# Patient Record
Sex: Male | Born: 1935 | Race: White | Hispanic: No | Marital: Married | Smoking: Former smoker
Health system: Southern US, Community
[De-identification: ages and names within clinical notes are randomized; demographics above are authoritative.]

## PROBLEM LIST (undated history)

## (undated) DIAGNOSIS — R234 Changes in skin texture: Secondary | ICD-10-CM

## (undated) DIAGNOSIS — D696 Thrombocytopenia, unspecified: Secondary | ICD-10-CM

## (undated) DIAGNOSIS — E785 Hyperlipidemia, unspecified: Secondary | ICD-10-CM

## (undated) DIAGNOSIS — N400 Enlarged prostate without lower urinary tract symptoms: Secondary | ICD-10-CM

## (undated) DIAGNOSIS — Z860100 Personal history of colon polyps, unspecified: Secondary | ICD-10-CM

## (undated) DIAGNOSIS — M1990 Unspecified osteoarthritis, unspecified site: Secondary | ICD-10-CM

## (undated) DIAGNOSIS — I739 Peripheral vascular disease, unspecified: Secondary | ICD-10-CM

## (undated) DIAGNOSIS — D699 Hemorrhagic condition, unspecified: Secondary | ICD-10-CM

## (undated) DIAGNOSIS — M2550 Pain in unspecified joint: Secondary | ICD-10-CM

## (undated) DIAGNOSIS — K219 Gastro-esophageal reflux disease without esophagitis: Secondary | ICD-10-CM

## (undated) DIAGNOSIS — H9190 Unspecified hearing loss, unspecified ear: Secondary | ICD-10-CM

## (undated) DIAGNOSIS — I219 Acute myocardial infarction, unspecified: Secondary | ICD-10-CM

## (undated) DIAGNOSIS — M2540 Effusion, unspecified joint: Secondary | ICD-10-CM

## (undated) DIAGNOSIS — I639 Cerebral infarction, unspecified: Secondary | ICD-10-CM

## (undated) DIAGNOSIS — Z8719 Personal history of other diseases of the digestive system: Secondary | ICD-10-CM

## (undated) DIAGNOSIS — Z8619 Personal history of other infectious and parasitic diseases: Secondary | ICD-10-CM

## (undated) DIAGNOSIS — R351 Nocturia: Secondary | ICD-10-CM

## (undated) DIAGNOSIS — I2510 Atherosclerotic heart disease of native coronary artery without angina pectoris: Secondary | ICD-10-CM

## (undated) DIAGNOSIS — I10 Essential (primary) hypertension: Secondary | ICD-10-CM

## (undated) DIAGNOSIS — R350 Frequency of micturition: Secondary | ICD-10-CM

## (undated) DIAGNOSIS — I5042 Chronic combined systolic (congestive) and diastolic (congestive) heart failure: Secondary | ICD-10-CM

## (undated) HISTORY — DX: Cerebral infarction, unspecified: I63.9

## (undated) HISTORY — PX: COLONOSCOPY: SHX174

## (undated) HISTORY — PX: SHOULDER SURGERY: SHX246

## (undated) HISTORY — PX: CATARACT EXTRACTION W/ INTRAOCULAR LENS  IMPLANT, BILATERAL: SHX1307

## (undated) HISTORY — PX: CAROTID ENDARTERECTOMY: SUR193

---

## 2000-03-26 ENCOUNTER — Ambulatory Visit (HOSPITAL_COMMUNITY): Admission: RE | Admit: 2000-03-26 | Discharge: 2000-03-26 | Payer: Self-pay | Admitting: Internal Medicine

## 2001-05-30 ENCOUNTER — Encounter: Payer: Self-pay | Admitting: Family Medicine

## 2001-05-30 ENCOUNTER — Ambulatory Visit (HOSPITAL_COMMUNITY): Admission: RE | Admit: 2001-05-30 | Discharge: 2001-05-30 | Payer: Self-pay | Admitting: Family Medicine

## 2001-06-13 ENCOUNTER — Ambulatory Visit (HOSPITAL_COMMUNITY): Admission: RE | Admit: 2001-06-13 | Discharge: 2001-06-13 | Payer: Self-pay | Admitting: Family Medicine

## 2001-06-13 ENCOUNTER — Encounter: Payer: Self-pay | Admitting: Family Medicine

## 2001-07-09 ENCOUNTER — Inpatient Hospital Stay (HOSPITAL_COMMUNITY): Admission: EM | Admit: 2001-07-09 | Discharge: 2001-07-12 | Payer: Self-pay

## 2002-08-30 ENCOUNTER — Ambulatory Visit (HOSPITAL_COMMUNITY): Admission: RE | Admit: 2002-08-30 | Discharge: 2002-08-30 | Payer: Self-pay | Admitting: Family Medicine

## 2002-08-30 ENCOUNTER — Encounter: Payer: Self-pay | Admitting: Family Medicine

## 2005-07-29 ENCOUNTER — Ambulatory Visit (HOSPITAL_COMMUNITY): Admission: RE | Admit: 2005-07-29 | Discharge: 2005-07-29 | Payer: Self-pay | Admitting: Family Medicine

## 2005-08-05 ENCOUNTER — Ambulatory Visit (HOSPITAL_COMMUNITY): Admission: RE | Admit: 2005-08-05 | Discharge: 2005-08-05 | Payer: Self-pay | Admitting: Family Medicine

## 2006-11-15 ENCOUNTER — Ambulatory Visit (HOSPITAL_COMMUNITY): Admission: RE | Admit: 2006-11-15 | Discharge: 2006-11-15 | Payer: Self-pay | Admitting: Family Medicine

## 2010-04-03 ENCOUNTER — Encounter (INDEPENDENT_AMBULATORY_CARE_PROVIDER_SITE_OTHER): Payer: Self-pay | Admitting: *Deleted

## 2010-05-01 NOTE — Letter (Signed)
Summary: Colonoscopy Letter  [facility]  [address] [place], [place] [address]   Phone: [phone]  Fax: [phone]      April 03, 2010 MRN: [id number]   [name] [name] [address] [place], [place]  [address]   Dear Mr. Smeal,   According to your medical record, it is time for you to schedule a Colonoscopy. The American Cancer Society recommends this procedure as a method to detect early colon cancer. Patients with a family history of colon cancer, or a personal history of colon polyps or inflammatory bowel disease are at increased risk.  This letter has been generated based on the recommendations made at the time of your procedure. If you feel that in your particular situation this may no longer apply, please contact our office.  Please call our office at [phone] to schedule this appointment or to update your records at your earliest convenience.  Thank you for cooperating with [place] to provide you with the very best care possible.   Sincerely,  Hedwig Morton. Juanda Chance, M.D.  [facility] [phone]
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
## 2010-08-15 NOTE — Procedures (Signed)
[place]. [facility]  Patient:    AISON, MALVEAUX Visit Number: [id number] MRN: [id number]          Service Type: OUT Location: RAD Attending Physician:  Lilyan Punt Dictated by:   Francisca December, M.D. Proc. Date: 07/09/01 Admit Date:  06/13/2001 Discharge Date: 06/13/2001   CC:         Eileen Stanford, M.D.  Meade Maw, M.D.   Procedure Report  PROCEDURE PERFORMED:  Percutaneous coronary intervention/stent implantation, distal right coronary artery.  INDICATIONS:  Mr. Laskowski is a 76 year old man who presents with an acute inferior wall myocardial infarction.  Dr. Fraser Din has completed coronary angiography revealing a complete occlusion of the distal right coronary.  He is undergoing direct angioplasty with likely stent implantation.  He has been entered in the AMI trial randomizing to standard care with PCI versus AngioJet therapy.  The randomization is complete and he will receive standard care with PCI.  PROCEDURAL NOTE:  Via the previously placed right femoral arterial #7 [place] sheath, a #7 [place] FR-4 Scimed Wiseguide guiding catheter was advanced to the ascending aorta where the right coronary os was engaged.  A 0.014-inch luge intracoronary guidewire would not pass across the lesion in the distal right coronary.  It was removed and exchanged for a 0.014 Trooper intracoronary guidewire.  This did successfully enter the posterolateral segment.  There was still no antegrade flow.  The initial balloon dilatation was performed with a 2.5 x 20-mm ACS Guidant CrossSail.  This was positioned in two locations in the distal right coronary and inflated to 8 atmospheres for less than 1 minute.  This did restore antegrade flow.  In fact it was difficult to visualize exactly where the lesion had been in the distal portion of the right coronary.  The patient experienced a rather intense Bezold-Jarisch reaction with hypotension, bradycardia, and  mild nausea.  This was treated with intravenous atropine 1 mg.  His pressure fell as low as 30 systolic for a brief period but then returned to around 90 systolic following the atropine. His heart rate rose to around 90 beats per minute.  At this point, a 3.5 x 20-mm Scimed Express intracoronary stent was placed in the distal portion of the right coronary such that the distal portion of the stent was at the bifurcation in the posterolateral branch and posterior descending artery.  It was deployed there at a peak inflation of 15 atmospheres for approximately 40 seconds.  These maneuvers resulted in wide patency of the right coronary with good TIMI-3 grade flow into the distal vessel; however, there was still persistent ST segment elevation and close examination of the angiograms revealed an occlusion in the very distal portion of the posterior descending artery.  At the completion of the procedure, the guidewire and guiding catheter were removed.  This was following confirmation of adequate patency in orthogonal views.  The catheter sheath was sutured in place.  The patient was transported to the recovery area in stable condition with an intact distal pulse.  The patient did receive an additional 2500 units of heparin during the procedure for an ACT of 195 seconds.  He received 5000 units of heparin in the emergency room.  ACT at close was 252 seconds.  He also received a double bolus of Integrilin and constant infusion.  ANGIOGRAPHY:  As mentioned, the lesion treated was in the distal portion of the right coronary where it was completely occluded.  Following balloon dilatation and stent implantation,  there was no residual stenosis appreciable in the right coronary.  There was the above-mentioned very distal occlusion in the posterior descending artery.  There was a large posterolateral branch and segment that showed good antegrade flow and no evidence of distal embolism.  FINAL  IMPRESSION: 1. Atherosclerotic coronary vascular disease, two-vessel. 2. Status post successful percutaneous transluminal coronary angioplasty/stent    implantation, distal right coronary. 3. Patients left arm discomfort was relieved following balloon dilatation/    stent implantation. Dictated by:   Francisca December, M.D. Attending Physician:  Lilyan Punt DD:  07/09/01 TD:  07/09/01 Job: [id number]

## 2010-08-15 NOTE — Cardiovascular Report (Signed)
[place]. [facility]  Patient:    [name] [name], [name] [name] Visit Number: [id number] MRN: [id number]          Service Type: OUT Location: RAD Attending Physician:  Lilyan Punt Dictated by:   Meade Maw, M.D. Admit Date:  06/13/2001 Discharge Date: 06/13/2001                          Cardiac Catheterization  PROCEDURE PERFORMED:  Left heart catheterization, coronary angiography, single plane ventriculogram.  INDICATION FOR PROCEDURE:  Acute anterior myocardial infarction.  DESCRIPTION OF PROCEDURE:  After obtaining written informed consent, the patient was brought to the cardiac catheterization lab on an emergent basis. The right groin was prepped and draped in the usual sterile fashion.  Local anesthesia was achieved using 1% Xylocaine.  IV sedation was achieved using 1 mg of Versed and 50 mcg of Fentanyl.  An #8 [place] hemostasis sheath was placed into the right femoral artery using the modified Seldinger technique. Selective coronary angiography was performed using JL-4, JR-4 Judkins catheters.  A single plane ventriculogram was performed in the RAO position using a #6 French pigtail curved catheter.  All catheter exchanges were made over a guidewire.  The hemostasis sheath was flushed following each engagement.  FINDINGS:  HEMODYNAMIC DATA: 1. Aortic pressure was 140/67. 2. LV pressure was 138/24.  EDP was 24.  LEFT VENTRICULOGRAM:  A single plane ventriculogram revealed inferior hypokinesis.  Ejection fraction of approximately 50%.  CORONARY ANGIOGRAPHY: 1. The left main coronary artery bifurcates into the left anterior descending    and circumflex vessel.  There is no significant disease in the left main    coronary artery.  2. Left anterior descending:  The left anterior descending gives rise to a    moderate bifurcating D-1, moderate bifurcating D-2, and goes on to end as    an apical recurrent branch.  There are luminal irregularities in  the left    anterior descending.  3. Circumflex vessel:  The circumflex vessel gives rise to a large    trifurcating OM-1, a small to moderate OM-2 and OM-3, then goes on to end    as an AV groove vessel.  There is an 80-90% proximal stenosis noted in the    large trifurcating OM-1.  4. Right coronary artery:  The right coronary artery is totally occluded with    a large thrombus at its mid vessel.  IMPRESSION: 1. Acute inferior myocardial infarction. 2. Total mid distal occlusion of the right coronary artery with a large    thrombus burden. 3. Critical disease in a large trifurcating obtuse marginal #1. 4. Preserved systolic function.  RECOMMENDATIONS:  The films were reviewed with Dr. Amil Amen.  He will proceed with percutaneous revascularization. Dictated by:   Meade Maw, M.D. Attending Physician:  Lilyan Punt DD:  07/09/01 TD:  07/09/01 Job: [id number]

## 2010-12-20 ENCOUNTER — Inpatient Hospital Stay (HOSPITAL_COMMUNITY): Payer: Medicare Other

## 2010-12-20 ENCOUNTER — Emergency Department (HOSPITAL_COMMUNITY): Payer: Medicare Other

## 2010-12-20 ENCOUNTER — Inpatient Hospital Stay (HOSPITAL_COMMUNITY)
Admission: EM | Admit: 2010-12-20 | Discharge: 2010-12-23 | DRG: 065 | Disposition: A | Discharge status: Home / Self Care | Payer: Medicare Other | Attending: Internal Medicine | Admitting: Internal Medicine

## 2010-12-20 DIAGNOSIS — B0229 Other postherpetic nervous system involvement: Secondary | ICD-10-CM | POA: Diagnosis present

## 2010-12-20 DIAGNOSIS — Z7902 Long term (current) use of antithrombotics/antiplatelets: Secondary | ICD-10-CM

## 2010-12-20 DIAGNOSIS — I498 Other specified cardiac arrhythmias: Secondary | ICD-10-CM | POA: Diagnosis present

## 2010-12-20 DIAGNOSIS — I672 Cerebral atherosclerosis: Secondary | ICD-10-CM | POA: Diagnosis present

## 2010-12-20 DIAGNOSIS — R4701 Aphasia: Secondary | ICD-10-CM | POA: Diagnosis present

## 2010-12-20 DIAGNOSIS — I2510 Atherosclerotic heart disease of native coronary artery without angina pectoris: Secondary | ICD-10-CM | POA: Diagnosis present

## 2010-12-20 DIAGNOSIS — M159 Polyosteoarthritis, unspecified: Secondary | ICD-10-CM | POA: Diagnosis present

## 2010-12-20 DIAGNOSIS — Z79899 Other long term (current) drug therapy: Secondary | ICD-10-CM

## 2010-12-20 DIAGNOSIS — I10 Essential (primary) hypertension: Secondary | ICD-10-CM | POA: Diagnosis present

## 2010-12-20 DIAGNOSIS — Z7982 Long term (current) use of aspirin: Secondary | ICD-10-CM

## 2010-12-20 DIAGNOSIS — Z9861 Coronary angioplasty status: Secondary | ICD-10-CM

## 2010-12-20 DIAGNOSIS — I63239 Cerebral infarction due to unspecified occlusion or stenosis of unspecified carotid arteries: Principal | ICD-10-CM | POA: Diagnosis present

## 2010-12-20 HISTORY — DX: Essential (primary) hypertension: I10

## 2010-12-20 HISTORY — DX: Atherosclerotic heart disease of native coronary artery without angina pectoris: I25.10

## 2010-12-20 LAB — DIFFERENTIAL
Basophils Absolute: 0 10*3/uL (ref 0.0–0.1)
Eosinophils Relative: 2 % (ref 0–5)
Lymphocytes Relative: 17 % (ref 12–46)
Neutro Abs: 8.1 10*3/uL — ABNORMAL HIGH (ref 1.7–7.7)
Neutrophils Relative %: 71 % (ref 43–77)

## 2010-12-20 LAB — BASIC METABOLIC PANEL
BUN: 30 mg/dL — ABNORMAL HIGH (ref 6–23)
CO2: 28 mEq/L (ref 19–32)
Chloride: 95 mEq/L — ABNORMAL LOW (ref 96–112)
GFR calc Af Amer: >60 mL/min (ref >60)
Potassium: 4.5 mEq/L (ref 3.5–5.1)

## 2010-12-20 LAB — CARDIAC PANEL(CRET KIN+CKTOT+MB+TROPI)
Relative Index: INVALID (ref 0.0–2.5)
Total CK: 55 U/L (ref 7–232)

## 2010-12-20 LAB — CBC
HCT: 48.7 % (ref 39.0–52.0)
RBC: 5.55 MIL/uL (ref 4.22–5.81)
RDW: 14.1 % (ref 11.5–15.5)
WBC: 11.5 10*3/uL — ABNORMAL HIGH (ref 4.0–10.5)

## 2010-12-20 LAB — APTT: aPTT: 28 seconds (ref 24–37)

## 2010-12-20 LAB — LIPID PANEL
Total CHOL/HDL Ratio: 5.5 RATIO
VLDL: 43 mg/dL — ABNORMAL HIGH (ref 0–40)

## 2010-12-20 LAB — PROTIME-INR: Prothrombin Time: 13.1 seconds (ref 11.6–15.2)

## 2010-12-20 MED ORDER — GADOBENATE DIMEGLUMINE 529 MG/ML IV SOLN
18.0000 mL | Freq: Once | INTRAVENOUS | Status: AC | PRN
  Administered 2010-12-20: 18 mL via INTRAVENOUS

## 2010-12-21 LAB — HEMOGLOBIN A1C
Hgb A1c MFr Bld: 5.8 % — ABNORMAL HIGH (ref <5.7)
Mean Plasma Glucose: 120 mg/dL — ABNORMAL HIGH (ref <117)

## 2010-12-21 NOTE — Consult Note (Signed)
NAMEJAYAN, [name]             ACCOUNT NO.:  [id number]  MEDICAL RECORD NO.:  [id number]  LOCATION:  [facility]                         FACILITY:  [facility]  PHYSICIAN:  Weston Settle, MD   DATE OF BIRTH:  Feb 18, 1935  DATE OF CONSULTATION: DATE OF DISCHARGE:                                CONSULTATION   CONSULTING PHYSICIAN:  Weston Settle, MD  CHIEF COMPLAINT:  Aphasia and numbness of the right face and right upper extremity.  HISTORY OF PRESENT ILLNESS:  This is a 76 year old right-hand dominant white male who had onset of expressive aphasia worsening over the last 2 days.  Wife brought the patient to the emergency room today because of increased difficulty with speech and complaints of numbness in the right face and right upper extremity.  Of note, on this past Thursday, the patient was seen by his primary care provider for postherpetic neuralgia pain and was started on nortriptyline, hydrocodone, and tramadol.  The patient took his first dose on Thursday.  On Friday morning, he woke with difficulty speaking, but no complaints of trouble swallowing.  The wife called EMS who promptly called the patient's primary care provider and told the patient to discontinue the nortriptyline.  He had 2 doses total of the nortriptyline.  This morning because of persistent and progressive symptoms, the patient was brought to the emergency room for further evaluation.  The patient denies any prior history of stroke.  PAST HISTORY:  Significant: 1. Coronary artery disease, status post MI with RCA PCI in 2003. 2. Hypertension. 3. Herpes zoster, treated 3 weeks ago with Valtrex and prednisone. 4. Postherpetic neuralgia pain of right flank.  CURRENT MEDICATIONS: 1. Enalapril. 2. Norvasc. 3. Aspirin occasionally. 4. Stopped nortriptyline, hydrocodone, and tramadol yesterday.  ALLERGIES:  NKDA.  FAMILY HISTORY:  Noncontributory.  SOCIAL HISTORY:  The patient is married.  Retired Fish farm manager.   Drinks caffeine.  Still drives and gardens and is normally very functional.  No tobacco, alcohol, or illicit drug use.  REVIEW OF SYSTEMS:  Significant for difficulty speaking and numbness of the right face and upper extremity.  He has no headaches, dysphasia, dizziness, or visual change.  No chest pain or shortness of breath.  No irregular heartbeat.  He has chronic osteoarthritis pain which affects multijoint.  No diabetes.  PHYSICAL EXAMINATION:  VITAL SIGNS:  Blood pressure is 114/63, heart rate 57, temperature 97.7, respirations 19, and SaO2 95% on room air. HEENT:  Normocephalic and atraumatic. NEURO:  Alert and x3 and follows multi-step commands, but occasional requires repeat instruction and cuing.  The patient is mildly dysarthric with expressive aphasia.  PERRL.  EOMI.  Conjugate gaze.  No nystagmus. Tongue is midline.  Smile symmetric (chronic left nasolabial fold droop).  Shoulder shrug intact.  Grip slightly stronger on the left than right.  Strength is 5/5 x4 extremities.  Moves all extremities purposely.  RAMI.  Slightly orbits on the right.  Finger-to-nose, heel- to-shin intact.  Sensation difficult to assess as the patient tends to perseverate on the word "left".  No drift.  DTRs 2+ in the upper extremities and 2+ at the patellas, absent at the Achilles, and equivocal plantars. SKIN:  He has scabbed lesions  on the right back and right flank.  TEST RESULTS:  Sodium 134, potassium 4.5, BUN 30, creatinine 1.21, and glucose 107.  WBC 11.5, hemoglobin 16.7, and platelet 130.  EKG shows sinus bradycardia.    CT of the head showed no bleed or acute change.  There is a left thalamic hypodensity of unclear age.    ASSESSMENT/PLAN:  This is a 76 year old right-handed dominant male with expressive aphasia and mild dysarthria as well as subjective right facial and right upper extremity weakness.  CT review by Dr. Nicholas Lose shows possible hypodensity in the left thalamic region  representing subacute infarct. Agree with MRI/MRA and aspirin.  The patient passed his swallow screen.  We will also get 2-D echo, check fasting lipid profile and hemoglobin A1c.  Of note, during the exam, the patient became slightly frustrated and diastolic blood pressure elevated to approximately 110.  It went back to normal around 80 when he calmed down.  It is possible that if this were stroke that the nortriptyline could have been partially responsible for elevated blood pressure which could have contributed to the infarct.  The patient was seen and examined by Dr. Nicholas Lose who agrees with the above treatment plan.  Thank you for allowing [place] to assist in management of this patient.     Luan Moore, P.A.   ______________________________ Weston Settle, MD    TCJ/MEDQ  D:  12/20/2010  T:  12/20/2010  Job:  [id number]  Electronically Signed by Delice Bison JERNEJCIC P.A. on 12/21/2010 07:18:35 AM Electronically Signed by Weston Settle MD on 12/21/2010 09:42:24 AM

## 2010-12-21 NOTE — H&P (Signed)
[name] [name], [name] [name] NO.:  [id number]  MEDICAL RECORD NO.:  [id number]  LOCATION:  [facility]                         FACILITY:  [facility]  PHYSICIAN:  Tarry Kos, MD       DATE OF BIRTH:  1934-09-29  DATE OF ADMISSION:  12/20/2010 DATE OF DISCHARGE:                             HISTORY & PHYSICAL   CHIEF COMPLAINT:  Trouble speaking.  HISTORY OF PRESENT ILLNESS:  [name] [name] is a 76 year old pleasant male with a history of coronary artery disease status post stent placement in 2003, hypertension, who recently has had a bout of shingles outbreak who started having problems speaking yesterday afternoon.  His family brought him in today because this has progressively gotten worse where he cannot express his thoughts and he is having trouble talking. He seems to have been understanding what they say but he cannot express himself.  They thought it was due to the amitriptyline that was recently started on for shingles outbreak 3 weeks ago.  He over the last 10 minutes has had significant improvement in his expressive aphasia.  His wife and his daughter are present right now and they say just in the last 10 minutes he is able to right now actually complete a sentence and answer questions appropriately.  He still has a hard time finding his words, but you can definitely understand him and it is not as bad as it previously was.  He denies any pain, denies any headache, and denies any recent illnesses besides the shingles.  No fevers, no nausea, vomiting, no drooling, and no other focal neurological deficits.  We are being asked to admit the patient for a stroke workup.  PAST MEDICAL HISTORY: 1. Coronary artery disease status post stent placement in the past. 2. Arthritis. 3. Hypertension. 4. Recent shingles.  MEDICATIONS: 1. He takes a baby aspirin occasionally, he does not take it every     day.  He has not been taking it recently because of all the other     pain  medicines he has been on because of the shingles. 2. Nortriptyline. 3. Enalapril 10 mg daily. 4. Hydrochlorothiazide 25 mg daily. 5. Metoprolol 50 mg twice a day. 6. Norvasc 10 mg daily. 7. Tramadol 50 mg as needed. 8. Vicodin as needed.  ALLERGIES:  None.  SOCIAL HISTORY:  He is a nonsmoker.  No alcohol.  No IV drug abuse.  PHYSICAL EXAMINATION:  VITAL SIGNS:  His temperature is 97.7, blood pressure 140/59, pulse 58, respirations 16, and 98% O2 sats on room air. GENERAL:  He is alert.  He knows his name.  He cannot express to which year it is.  He can answer most questions appropriately but he has a difficult time with initiating the answer and sometimes he cannot answer questions appropriately.  However, he appears to have normal comprehension consistent with expressive aphasia.  His respiratory status is normal.  There is no drooling. HEENT:  Extraocular muscles are intact.  Pupils equal and reactive to light.  Oropharynx is clear.  Mucous membranes moist. NECK:  No JVD.  No carotid bruits. COR:  Regular rate and rhythm without murmurs, rubs, or gallops. CHEST:  Clear to auscultation  bilaterally.  No wheezes or rales. ABDOMEN:  Soft, nontender, and nondistended.  Positive bowel sounds.  No hepatosplenomegaly. EXTREMITIES:  No clubbing, cyanosis, or edema. PSYCH:  Normal affect. NEURO:  Expressive aphasia.  Cranial nerves II-XII grossly intact.  IMAGING:  CT of his head is negative for any acute bleed or abnormalities.  Electrolytes are normal.  BUN and creatinine are 30 and 1.21, sodium is 134.  White count is 11.5 and hemoglobin 16.7.  ASSESSMENT AND PLAN:  This is a 76 year old male with expressive aphasia. 1. Expressive aphasia, likely secondary to an acute cerebrovascular     accident.  We will place on aspirin 325 mg daily and obtain MRI/MRA     of his brain and neck and also check a 2-D echo.  We will check     B12, folate, TSH, coags, and fasting lipid panel and  also one set     of cardiac enzymes. 2. Hypertension.  This is stable. 3. Recent shingles outbreak, that is resolving.  Continue the     nortriptyline as needed. 4. Neurology has been consulted and will be seeing the patient. 5. History of coronary artery disease.  This is stable. 6. The patient is full code.  Further recommendation pending over     hospital course.          ______________________________ Tarry Kos, MD     RD/MEDQ  D:  12/20/2010  T:  12/20/2010  Job:  [id number]  Electronically Signed by Tarry Kos MD on 12/21/2010 10:47:48 AM

## 2010-12-22 ENCOUNTER — Encounter (HOSPITAL_COMMUNITY): Payer: Self-pay | Admitting: Radiology

## 2010-12-22 ENCOUNTER — Inpatient Hospital Stay (HOSPITAL_COMMUNITY): Payer: Medicare Other

## 2010-12-22 DIAGNOSIS — I517 Cardiomegaly: Secondary | ICD-10-CM

## 2010-12-22 LAB — COMPREHENSIVE METABOLIC PANEL
AST: 17 U/L (ref 0–37)
Albumin: 3.7 g/dL (ref 3.5–5.2)
Calcium: 9.3 mg/dL (ref 8.4–10.5)
Creatinine, Ser: 0.93 mg/dL (ref 0.50–1.35)
GFR calc non Af Amer: >60 mL/min (ref >60)

## 2010-12-22 LAB — CBC
MCH: 30.2 pg (ref 26.0–34.0)
MCHC: 35 g/dL (ref 30.0–36.0)
MCV: 86.2 fL (ref 78.0–100.0)
Platelets: 104 10*3/uL — ABNORMAL LOW (ref 150–400)
RDW: 13.9 % (ref 11.5–15.5)

## 2010-12-22 MED ORDER — IOHEXOL 350 MG/ML SOLN
50.0000 mL | Freq: Once | INTRAVENOUS | Status: AC | PRN
  Administered 2010-12-22: 50 mL via INTRAVENOUS

## 2011-01-07 NOTE — Discharge Summary (Signed)
NAMEAKBAR, SACRA             ACCOUNT NO.:  [id number]  MEDICAL RECORD NO.:  [id number]  LOCATION:  3021                         FACILITY:  [facility]  PHYSICIAN:  Baltazar Najjar, MD     DATE OF BIRTH:  Apr 27, 1934  DATE OF ADMISSION:  12/20/2010 DATE OF DISCHARGE:  12/23/2010                              DISCHARGE SUMMARY   FINAL DISCHARGE DIAGNOSES: 1. Multiple acute infarcts. 2. Left internal carotid artery occlusion. 3. Hypertension. 4. History of coronary artery disease status post stent. 5. Arthritis. 6. History of recent shingles, treated.  CONSULTATION DURING THIS HOSPITALIZATION:  [facility].  PROCEDURES/IMAGING STUDIES: 1. CT head negative for bleed or other acute intracranial process. 2. MRI of the brain showed multiple foci of acute infarct in the left     middle cerebral artery.  This may be due to hypoperfusion or     emboli. 3. MRA of the neck showed left internal carotid artery is occluded at     the origin.  30% diameter stenosis of the proximal right internal     carotid artery. 4. CTA neck showed left ICA abruptly occluded at its origin.  Abundant     atherosclerotic disease including the left carotid bifurcation.     Subsequent hemodynamically significant stenosis including right     common carotid artery origin up to 70%.  Left vertebral artery     origin up to 60%. 5. CTA head showed distal left ICA siphon is reconstituted from circle     of Willis collaterals.  Mildly decreased left MCA and left ACA     enhancement compared to the right side with no major branch     occlusion.  Right ICA siphon and distal left vertebral artery     atherosclerosis without hemodynamically significant stenosis.     Stable CT appearance of the brain recently seen, small left MCA     infarct and large occult by CT.  No mass effect or hemorrhage.  BRIEF ADMITTING HISTORY:  Please refer to H and P for more details.  In summary, [name] is a  76 year old man with history of coronary artery disease status post stent, hypertension, and other comorbidities who presented to the ER on December 20, 2010, with difficulty speaking and expressive aphasia.  Please refer to H and P for more details.  HOSPITAL COURSE: 1. The patient was admitted with a diagnosis of rule out stroke.  He     was admitted to the telemetry bed on the Neurology floor.  His     stroke workup including CT head, MRI brain, MRA, and CTA neck and     head as above showing multiple acute infarcts and left ICA     occlusion with significant atherosclerotic stenosis in his cerebral     circulation as above.  He also had 2-D echocardiogram done that     showed EF of 60% to 65%, normal systolic function.  No significant     valvular disease.  No evidence of thrombi by 2-D echocardiogram or     sharp.  The patient was followed along by Neurology Service today     on followup visits and recommended to  discharge him home on aspirin     and Plavix.  He was also seen by speech pathology and outpatient     speech will be arranged.  There was no motor weakness noted on his     exam.  He is able to ambulate with no difficulty; however, I will     obtain PT/OT consultation prior to discharge him home to check for     any home needs. 2. Hypertension.  His antihypertensive medication was initially held     because of acute stroke; however, metoprolol was resumed and his     blood pressure remained between 122 and 153 with metoprolol only.     Noted that the patient was on four different antihypertensive     medications as an outpatient including enalapril, metoprolol,     Norvasc, and hydrochlorothiazide.  His BUN was elevated at 30 and     his creatinine was also elevated on presentation indicating     prerenal azotemia.  So I will hold his hydrochlorothiazide and I     will also help his enalapril.  I will resume his Norvasc and     continue on the metoprolol.  The patient  will follow his blood     pressure at home regularly and discuss readings with his doctor for     adjustment of his antihypertensive regimen, which I think can be     simplified. 3. History of coronary artery disease status post stent.  The patient     on aspirin and Plavix and beta-blocker to continue 4. Arthritis.  Continue pain regimen. 5. Recent shingles, treated.  The patient also on nortriptyline for     post-herpetic neuralgia. 6. Sinus tachycardia.  The patient was on metoprolol.  His tachycardia     is currently controlled.  His EKG showed sinus rhythm.  DISCHARGE MEDICATIONS: 1. Lasix 75 mg p.o. daily with meals. 2. Aspirin 81 mg p.o. daily. 3. Metoprolol 50 mg p.o. twice daily. 4. Nortriptyline 10 mg 2 capsules p.o. daily at bedtime. 5. Norvasc 10 mg 1 tablet p.o. daily. 6. Tramadol 50 mg p.o. three times a day. 7. Vicodin 1 tablet p.o. q.4 h. p.r.n.  DISCONTINUED MEDICATION: 1. Aleve. 2. Hydrochlorothiazide. 3. Enalapril  DISCHARGE INSTRUCTIONS: 1. Continue above medications as prescribed. 2. As per Neurology recommendation, continue both aspirin and Plavix     for 3 months then continue with aspirin only. 3. Follow with Neurology Service in 2 months as per Dr. Pearlean Brownie and     follow with PCP preferably within 1 week of discharge. 4. Report any worsening of symptoms or any new symptoms to PCP or come     back to the ED if needed. 5. Monitor blood pressure regularly at home and discuss results with     your doctor.  DISCHARGE CONDITION:  Stable/improved.  Speech therapy to be arranged for an outpatient, PT/OT to see him prior to discharge.          ______________________________ Baltazar Najjar, MD     SA/MEDQ  D:  12/23/2010  T:  12/23/2010  Job:  [id number]  cc:   Pramod P. Pearlean Brownie, MD Scott A. Gerda Diss, MD  Electronically Signed by Hannah Beat MD on 01/07/2011 09:35:24 PM

## 2011-03-17 ENCOUNTER — Other Ambulatory Visit: Payer: Self-pay | Admitting: Internal Medicine

## 2012-08-24 ENCOUNTER — Other Ambulatory Visit: Payer: Self-pay | Admitting: Family Medicine

## 2012-08-25 ENCOUNTER — Other Ambulatory Visit: Payer: Self-pay | Admitting: Family Medicine

## 2012-08-25 NOTE — Telephone Encounter (Signed)
Rx called into pharm 

## 2012-09-01 ENCOUNTER — Encounter: Payer: Self-pay | Admitting: *Deleted

## 2012-09-06 ENCOUNTER — Encounter: Payer: Self-pay | Admitting: Family Medicine

## 2012-09-06 ENCOUNTER — Ambulatory Visit (INDEPENDENT_AMBULATORY_CARE_PROVIDER_SITE_OTHER): Payer: Medicare Other | Admitting: Family Medicine

## 2012-09-06 VITALS — BP 140/80 | HR 70 | Wt 190.0 lb

## 2012-09-06 DIAGNOSIS — I10 Essential (primary) hypertension: Secondary | ICD-10-CM | POA: Insufficient documentation

## 2012-09-06 DIAGNOSIS — E785 Hyperlipidemia, unspecified: Secondary | ICD-10-CM | POA: Insufficient documentation

## 2012-09-06 DIAGNOSIS — M1990 Unspecified osteoarthritis, unspecified site: Secondary | ICD-10-CM

## 2012-09-06 DIAGNOSIS — I63239 Cerebral infarction due to unspecified occlusion or stenosis of unspecified carotid arteries: Secondary | ICD-10-CM

## 2012-09-06 DIAGNOSIS — Z8673 Personal history of transient ischemic attack (TIA), and cerebral infarction without residual deficits: Secondary | ICD-10-CM | POA: Insufficient documentation

## 2012-09-06 DIAGNOSIS — M129 Arthropathy, unspecified: Secondary | ICD-10-CM

## 2012-09-06 MED ORDER — TRAMADOL HCL 50 MG PO TABS: 50.0000 mg | ORAL_TABLET | Freq: Three times a day (TID) | ORAL | Status: DC | PRN

## 2012-09-06 NOTE — Progress Notes (Signed)
Carotid u/s September 12, 2012 10:00 am. Pt aware

## 2012-09-06 NOTE — Progress Notes (Signed)
  Subjective:    Patient ID: [name] [name], male    DOB: [birth year], 78 y.o.   MRN: [id number]  HPI 3 month check up for hyperlipidemia, hypertension. States doing well. No special concerns at this time This patient had previous stroke. He does need to have a carotid ultrasound done. He does take his medicines states his blood pressure readings at home and been good. He denies any chest tightness pressure pain or shortness of breath. He does try to keep healthy. Past medical history stroke, hypertension, hyperlipidemia, arthritis family history noncontributory social does not smoke  Review of Systems See above. Lungs no cough. No chest tightness pressure pain no abdominal pain or rectal bleeding    Objective:   Physical Exam TMs NL, throat normal, neck supple no masses, neurologic grossly normal Lungs are clear no crackles Heart is regular no murmurs Abdomen is soft Extremities no edema skin warm dry       Assessment & Plan:  #1 hypertension continue current medication. Followup again in 6 months time #2 history of stroke ultrasound ordered continue Plavix #3 by later this year he does need to do blood work looking at his cholesterol again. #4 arthralgias tramadol as directed

## 2012-09-12 ENCOUNTER — Ambulatory Visit (HOSPITAL_COMMUNITY)
Admission: RE | Admit: 2012-09-12 | Discharge: 2012-09-12 | Disposition: A | Discharge status: Home / Self Care | Payer: Medicare Other | Source: Ambulatory Visit | Attending: Family Medicine | Admitting: Family Medicine

## 2012-09-12 DIAGNOSIS — I63239 Cerebral infarction due to unspecified occlusion or stenosis of unspecified carotid arteries: Secondary | ICD-10-CM

## 2012-09-12 DIAGNOSIS — I10 Essential (primary) hypertension: Secondary | ICD-10-CM | POA: Insufficient documentation

## 2012-09-12 DIAGNOSIS — I6529 Occlusion and stenosis of unspecified carotid artery: Secondary | ICD-10-CM | POA: Insufficient documentation

## 2012-09-20 ENCOUNTER — Other Ambulatory Visit: Payer: Self-pay | Admitting: *Deleted

## 2012-09-20 DIAGNOSIS — Z0000 Encounter for general adult medical examination without abnormal findings: Secondary | ICD-10-CM

## 2012-10-24 ENCOUNTER — Encounter: Payer: Self-pay | Admitting: Vascular Surgery

## 2012-10-25 ENCOUNTER — Ambulatory Visit (INDEPENDENT_AMBULATORY_CARE_PROVIDER_SITE_OTHER): Payer: Medicare Other | Admitting: Vascular Surgery

## 2012-10-25 ENCOUNTER — Encounter: Payer: Self-pay | Admitting: Vascular Surgery

## 2012-10-25 VITALS — BP 196/81 | HR 60 | Resp 16 | Ht 69.0 in | Wt 200.0 lb

## 2012-10-25 DIAGNOSIS — I63239 Cerebral infarction due to unspecified occlusion or stenosis of unspecified carotid arteries: Secondary | ICD-10-CM

## 2012-10-25 NOTE — Progress Notes (Signed)
Subjective:     Patient ID: [name] [name], male   DOB: February 20, 1935, 78 y.o.   MRN: [id number]  HPI this 78 year old male suffered a left brain CVA 2 years ago. He had right-sided clumsiness and speech problems. He did have rehabilitation and developed a tremendous return of function. He has been referred today for evaluation of carotid occlusive disease. He was found to have a left ICA occlusion at the time of his stroke. He currently takes Plavix. He denies any new episodes of aphasia, lateralizing weakness, amaurosis fugax, diplopia, blurred vision, or syncope.  Past Medical History  Diagnosis Date  . Hypertension   . CAD (coronary artery disease)   . MI (myocardial infarction)   . S/P coronary artery stent placement   . Hemorrhoids   . Stroke     History  Substance Use Topics  . Smoking status: Never Smoker   . Smokeless tobacco: Not on file  . Alcohol Use: Not on file    No family history on file.  Allergies  Allergen Reactions  . Lipitor (Atorvastatin)     Muscle and joint pain    Current outpatient prescriptions:amLODipine (NORVASC) 10 MG tablet, TAKE ONE TABLET BY MOUTH EVERY DAY., Disp: 30 tablet, Rfl: 5;  clopidogrel (PLAVIX) 75 MG tablet, Take 75 mg by mouth daily., Disp: , Rfl: ;  HYDROcodone-acetaminophen (NORCO/VICODIN) 5-325 MG per tablet, Take 1 tablet by mouth as needed for pain., Disp: , Rfl: ;  metoprolol (LOPRESSOR) 50 MG tablet, TAKE ONE TABLET BY MOUTH TWICE DAILY., Disp: 60 tablet, Rfl: 5 traMADol (ULTRAM) 50 MG tablet, Take 1 tablet (50 mg total) by mouth every 8 (eight) hours as needed for pain., Disp: 75 tablet, Rfl: 4  BP 196/81  Pulse 60  Resp 16  Ht 5\' 9"  (1.753 m)  Wt 200 lb (90.719 kg)  BMI 29.52 kg/m2  Body mass index is 29.52 kg/(m^2).           Review of Systems complains of pain in his legs with ambulation, weakness in his legs with ambulation, occasional numbness in both hands, mild chest discomfort, denies chest pain, dyspnea  on exertion, PND, orthopnea, hemoptysis. All other systems negative and complete review of systems     Objective:   Physical Exam blood pressure 196/81 heart rate 60 respirations 16 Gen.-alert and oriented x3 in no apparent distress HEENT normal for age Lungs no rhonchi or wheezing Cardiovascular regular rhythm no murmurs carotid pulses 3+ palpable no bruits audible Abdomen soft nontender no palpable masses Musculoskeletal free of  major deformities Skin clear -no rashes Neurologic normal Lower extremities 3+ femoral and dorsalis pedis pulses palpable bilaterally with no edema  Today I reviewed the carotid duplex exam which was performed recently at [facility]. This revealed left ICA occlusion and proximal and 60% right ICA stenosis. This is consistent with the velocities on the report.      Assessment:     2 years status post left brain CVA with left ICA occlusion and moderate-60% right ICA stenosis-asymptomatic Coronary artery disease with previous stent in 2000 and currently patient is on Plavix since his CVA    Plan:     Return in one year with carotid duplex exam in our office to follow moderate right ICA stenosis and less patient developed symptoms in the interim and will continue on Plavix

## 2012-11-01 ENCOUNTER — Other Ambulatory Visit: Payer: Self-pay | Admitting: *Deleted

## 2012-11-22 ENCOUNTER — Other Ambulatory Visit: Payer: Self-pay | Admitting: *Deleted

## 2012-11-22 ENCOUNTER — Other Ambulatory Visit: Payer: Self-pay | Admitting: Family Medicine

## 2012-11-22 MED ORDER — TRAMADOL HCL 50 MG PO TABS: 50.0000 mg | ORAL_TABLET | Freq: Three times a day (TID) | ORAL | Status: DC | PRN

## 2012-11-22 MED ORDER — HYDROCODONE-ACETAMINOPHEN 5-325 MG PO TABS: ORAL_TABLET | ORAL | Status: DC

## 2012-11-22 MED ORDER — CLOPIDOGREL BISULFATE 75 MG PO TABS: 75.0000 mg | ORAL_TABLET | Freq: Every day | ORAL | Status: DC

## 2012-11-22 NOTE — Telephone Encounter (Signed)
Refill each times 2, ov needed before further

## 2012-11-22 NOTE — Telephone Encounter (Signed)
RX called in on voicemail 

## 2012-11-22 NOTE — Telephone Encounter (Signed)
Last office visit was 09/06/12 and tramadol 50 mg was prescribed.

## 2012-12-23 ENCOUNTER — Telehealth: Payer: Self-pay | Admitting: Family Medicine

## 2012-12-23 DIAGNOSIS — E782 Mixed hyperlipidemia: Secondary | ICD-10-CM

## 2012-12-23 DIAGNOSIS — Z79899 Other long term (current) drug therapy: Secondary | ICD-10-CM

## 2012-12-23 DIAGNOSIS — Z125 Encounter for screening for malignant neoplasm of prostate: Secondary | ICD-10-CM

## 2012-12-23 NOTE — Telephone Encounter (Signed)
BW papers for upcoming PE on Oct 6th

## 2012-12-23 NOTE — Telephone Encounter (Signed)
protocol lipid, met 7, and psa- your rec anything else

## 2012-12-25 NOTE — Telephone Encounter (Signed)
I. agree with those labs. Add a CBC because he is on Plavix.

## 2012-12-26 NOTE — Telephone Encounter (Signed)
Blood work ordered in Epic. Patient notified. 

## 2012-12-27 LAB — CBC WITH DIFFERENTIAL/PLATELET
Basophils Absolute: 0 10*3/uL (ref 0.0–0.1)
Eosinophils Relative: 4 % (ref 0–5)
HCT: 43 % (ref 39.0–52.0)
Lymphocytes Relative: 19 % (ref 12–46)
MCH: 29.5 pg (ref 26.0–34.0)
MCV: 84.1 fL (ref 78.0–100.0)
Monocytes Absolute: 0.6 10*3/uL (ref 0.1–1.0)
RDW: 14.4 % (ref 11.5–15.5)
WBC: 6.8 10*3/uL (ref 4.0–10.5)

## 2012-12-27 LAB — LIPID PANEL
LDL Cholesterol: 71 mg/dL (ref 0–99)
VLDL: 38 mg/dL (ref 0–40)

## 2012-12-27 LAB — BASIC METABOLIC PANEL
BUN: 18 mg/dL (ref 6–23)
Potassium: 4.2 mEq/L (ref 3.5–5.3)
Sodium: 139 mEq/L (ref 135–145)

## 2012-12-27 LAB — HEPATIC FUNCTION PANEL
AST: 13 U/L (ref 0–37)
Alkaline Phosphatase: 54 U/L (ref 39–117)
Bilirubin, Direct: 0.1 mg/dL (ref 0.0–0.3)
Indirect Bilirubin: 0.4 mg/dL (ref 0.0–0.9)
Total Bilirubin: 0.5 mg/dL (ref 0.3–1.2)

## 2012-12-27 LAB — PSA, MEDICARE: PSA: 0.69 ng/mL (ref <=4.00)

## 2013-01-02 ENCOUNTER — Ambulatory Visit (INDEPENDENT_AMBULATORY_CARE_PROVIDER_SITE_OTHER): Payer: Medicare Other | Admitting: Family Medicine

## 2013-01-02 ENCOUNTER — Encounter: Payer: Self-pay | Admitting: Family Medicine

## 2013-01-02 VITALS — BP 132/70 | Ht 69.0 in | Wt 190.8 lb

## 2013-01-02 DIAGNOSIS — E785 Hyperlipidemia, unspecified: Secondary | ICD-10-CM

## 2013-01-02 DIAGNOSIS — I10 Essential (primary) hypertension: Secondary | ICD-10-CM

## 2013-01-02 MED ORDER — TRAMADOL HCL 50 MG PO TABS: 50.0000 mg | ORAL_TABLET | Freq: Three times a day (TID) | ORAL | Status: DC | PRN

## 2013-01-02 MED ORDER — HYDROCODONE-ACETAMINOPHEN 5-325 MG PO TABS: ORAL_TABLET | ORAL | Status: DC

## 2013-01-02 NOTE — Progress Notes (Signed)
  Subjective:    Patient ID: [name] [name], male    DOB: Nov 05, 1934, 78 y.o.   MRN: [id number]  HPI Patient arrives for annual physical. No problems AWV- Annual Wellness Visit  The patient was seen for their annual wellness visit. The patient's past medical history, surgical history, and family history were reviewed. Pertinent vaccines were reviewed ( tetanus, pneumonia, shingles, flu) The patient's medication list was reviewed and updated. The height and weight were entered. The patient's current BMI is:  Cognitive screening was completed. Outcome of Mini - Cog:  Falls within the past 6 months: Current tobacco usage: (All patients who use tobacco were given written and verbal information on quitting)  Recent listing of emergency department/hospitalizations over the past year were reviewed.  current specialist the patient sees on a regular basis:   Medicare annual wellness visit patient questionnaire was reviewed.  A written screening schedule for the patient for the next 5-10 years was given. Appropriate discussion of followup regarding next visit was discussed.  Patient defers on colonoscopy even after long discussion he still not does not want he denies rectal bleeding denies hematuria he was advised to minimize anti-inflammatory use  Patient has history of arthritis hyperlipidemia and stroke. Getting along okay able to do all functions in the household cognitive function is good no falls. Passes cognitive tests      Review of Systems See above.    Objective:   Physical Exam  Constitutional: He appears well-developed and well-nourished.  HENT:  Head: Normocephalic and atraumatic.  Right Ear: External ear normal.  Left Ear: External ear normal.  Nose: Nose normal.  Mouth/Throat: Oropharynx is clear and moist.  Eyes: EOM are normal. Pupils are equal, round, and reactive to light.  Neck: Normal range of motion. Neck supple. No thyromegaly present.  Cardiovascular:  Normal rate, regular rhythm and normal heart sounds.   No murmur heard. Pulmonary/Chest: Effort normal and breath sounds normal. No respiratory distress. He has no wheezes.  Abdominal: Soft. Bowel sounds are normal. He exhibits no distension and no mass. There is no tenderness.  Genitourinary: Penis normal.  Musculoskeletal: Normal range of motion. He exhibits no edema.  Lymphadenopathy:    He has no cervical adenopathy.  Neurological: He is alert. He exhibits normal muscle tone.  Skin: Skin is warm and dry. No erythema.  Psychiatric: He has a normal mood and affect. His behavior is normal. Judgment normal.          Assessment & Plan:  Wellness exam discussed importance of activity physical activity maintain medications no further lab work necessary I recommend followup again in 6 months time. Colonoscopy recommended patient defers.

## 2013-01-03 ENCOUNTER — Encounter: Payer: Medicare Other | Admitting: Family Medicine

## 2013-01-12 ENCOUNTER — Other Ambulatory Visit: Payer: Self-pay | Admitting: Family Medicine

## 2013-01-16 ENCOUNTER — Other Ambulatory Visit: Payer: Self-pay | Admitting: *Deleted

## 2013-01-16 MED ORDER — AMLODIPINE BESYLATE 10 MG PO TABS: 10.0000 mg | ORAL_TABLET | Freq: Every day | ORAL | Status: DC

## 2013-03-30 DIAGNOSIS — I739 Peripheral vascular disease, unspecified: Secondary | ICD-10-CM

## 2013-03-30 DIAGNOSIS — I2510 Atherosclerotic heart disease of native coronary artery without angina pectoris: Secondary | ICD-10-CM

## 2013-03-30 HISTORY — DX: Peripheral vascular disease, unspecified: I73.9

## 2013-03-30 HISTORY — DX: Atherosclerotic heart disease of native coronary artery without angina pectoris: I25.10

## 2013-04-15 ENCOUNTER — Other Ambulatory Visit: Payer: Self-pay | Admitting: Family Medicine

## 2013-05-01 ENCOUNTER — Ambulatory Visit (INDEPENDENT_AMBULATORY_CARE_PROVIDER_SITE_OTHER): Payer: Medicare HMO | Admitting: Family Medicine

## 2013-05-01 ENCOUNTER — Encounter: Payer: Self-pay | Admitting: Family Medicine

## 2013-05-01 VITALS — BP 144/80 | Ht 67.5 in | Wt 191.0 lb

## 2013-05-01 DIAGNOSIS — I10 Essential (primary) hypertension: Secondary | ICD-10-CM

## 2013-05-01 MED ORDER — TRAMADOL HCL 50 MG PO TABS: 50.0000 mg | ORAL_TABLET | Freq: Three times a day (TID) | ORAL | Status: DC | PRN

## 2013-05-01 MED ORDER — METOPROLOL TARTRATE 50 MG PO TABS: ORAL_TABLET | ORAL | Status: DC

## 2013-05-01 MED ORDER — AMLODIPINE BESYLATE 10 MG PO TABS: 10.0000 mg | ORAL_TABLET | Freq: Every day | ORAL | Status: DC

## 2013-05-01 MED ORDER — CLOPIDOGREL BISULFATE 75 MG PO TABS: ORAL_TABLET | ORAL | Status: DC

## 2013-05-01 NOTE — Progress Notes (Signed)
   Subjective:    Patient ID: [name] [name], male    DOB: 12/18/1934, 79 y.o.   MRN: [id number]  Hypertension This is a chronic problem. The current episode started more than 1 year ago. The problem is unchanged. The problem is controlled. Pertinent negatives include no anxiety, chest pain, palpitations, PND or shortness of breath. There are no associated agents to hypertension. Past treatments include lifestyle changes, calcium channel blockers and beta blockers. The current treatment provides mild improvement. There are no compliance problems.  Hypertensive end-organ damage includes CVA. There is no history of heart failure or left ventricular hypertrophy.  Htn check up. No concerns. Requesting 90 supply on all meds to sent to mail order.  Patient also had a stroke in the past but has not had any new symptoms of it he states he saw the vascular surgeon last year who told him things seem to be going fine He denies any unilateral numbness or weakness  Review of Systems  Constitutional: Negative for activity change, appetite change and fatigue.  Respiratory: Negative for cough and shortness of breath.   Cardiovascular: Negative for chest pain, palpitations and PND.  Gastrointestinal: Negative for abdominal pain.  Endocrine: Negative for polydipsia and polyphagia.  Neurological: Negative for weakness.  Psychiatric/Behavioral: Negative for confusion.       Objective:   Physical Exam  Vitals reviewed. Constitutional: He appears well-nourished. No distress.  HENT:  Head: Normocephalic.  Right Ear: External ear normal.  Left Ear: External ear normal.  Neck: No tracheal deviation present.  Cardiovascular: Normal rate, regular rhythm and normal heart sounds.   No murmur heard. Pulmonary/Chest: Effort normal and breath sounds normal. No respiratory distress.  Musculoskeletal: He exhibits no edema.  Lymphadenopathy:    He has no cervical adenopathy.  Neurological: He is alert.    Psychiatric: His behavior is normal.    Blood pressure is slightly elevated above where I would like it to be but he states is actually doing much better at other times. When he checks at home.     Assessment & Plan:  HTN- continue meds CVA- no new, check labs and U/S in June

## 2013-05-02 ENCOUNTER — Telehealth: Payer: Self-pay | Admitting: Family Medicine

## 2013-05-02 NOTE — Telephone Encounter (Signed)
Faxed RX's to [phone] per pt request

## 2013-05-02 NOTE — Telephone Encounter (Signed)
Please fax them accordingly thank you

## 2013-05-02 NOTE — Telephone Encounter (Signed)
Patient needs all the prescriptions that he received yesterday faxed to [facility] fax-[phone]. He said his account is now set up and they told him it'd be easier if we just faxed.

## 2013-05-02 NOTE — Telephone Encounter (Signed)
Called patient and let them know to bring Rx's up here.

## 2013-05-16 ENCOUNTER — Other Ambulatory Visit: Payer: Self-pay | Admitting: Vascular Surgery

## 2013-05-16 DIAGNOSIS — I6529 Occlusion and stenosis of unspecified carotid artery: Secondary | ICD-10-CM

## 2013-06-28 HISTORY — PX: CORONARY ANGIOPLASTY WITH STENT PLACEMENT: SHX49

## 2013-07-02 ENCOUNTER — Other Ambulatory Visit: Payer: Self-pay

## 2013-07-02 ENCOUNTER — Encounter (HOSPITAL_COMMUNITY): Payer: Self-pay | Admitting: Emergency Medicine

## 2013-07-02 ENCOUNTER — Encounter (HOSPITAL_COMMUNITY)
Admission: EM | Disposition: A | Discharge status: Home / Self Care | Payer: Commercial Managed Care - HMO | Source: Home / Self Care | Attending: Cardiovascular Disease

## 2013-07-02 ENCOUNTER — Ambulatory Visit (HOSPITAL_COMMUNITY): Admit: 2013-07-02 | Payer: Self-pay | Admitting: Cardiovascular Disease

## 2013-07-02 ENCOUNTER — Inpatient Hospital Stay (HOSPITAL_COMMUNITY)
Admission: EM | Admit: 2013-07-02 | Discharge: 2013-07-05 | DRG: 247 | Disposition: A | Discharge status: Home / Self Care | Payer: Medicare HMO | Attending: Cardiovascular Disease | Admitting: Cardiovascular Disease

## 2013-07-02 DIAGNOSIS — I2109 ST elevation (STEMI) myocardial infarction involving other coronary artery of anterior wall: Secondary | ICD-10-CM | POA: Diagnosis not present

## 2013-07-02 DIAGNOSIS — I10 Essential (primary) hypertension: Secondary | ICD-10-CM | POA: Diagnosis present

## 2013-07-02 DIAGNOSIS — Z888 Allergy status to other drugs, medicaments and biological substances status: Secondary | ICD-10-CM

## 2013-07-02 DIAGNOSIS — E785 Hyperlipidemia, unspecified: Secondary | ICD-10-CM | POA: Diagnosis not present

## 2013-07-02 DIAGNOSIS — Z87891 Personal history of nicotine dependence: Secondary | ICD-10-CM

## 2013-07-02 DIAGNOSIS — I252 Old myocardial infarction: Secondary | ICD-10-CM

## 2013-07-02 DIAGNOSIS — Z8673 Personal history of transient ischemic attack (TIA), and cerebral infarction without residual deficits: Secondary | ICD-10-CM

## 2013-07-02 DIAGNOSIS — I739 Peripheral vascular disease, unspecified: Secondary | ICD-10-CM | POA: Diagnosis not present

## 2013-07-02 DIAGNOSIS — I219 Acute myocardial infarction, unspecified: Secondary | ICD-10-CM

## 2013-07-02 DIAGNOSIS — I2510 Atherosclerotic heart disease of native coronary artery without angina pectoris: Secondary | ICD-10-CM | POA: Diagnosis present

## 2013-07-02 DIAGNOSIS — I213 ST elevation (STEMI) myocardial infarction of unspecified site: Secondary | ICD-10-CM

## 2013-07-02 DIAGNOSIS — Z9861 Coronary angioplasty status: Secondary | ICD-10-CM

## 2013-07-02 DIAGNOSIS — I708 Atherosclerosis of other arteries: Secondary | ICD-10-CM | POA: Diagnosis present

## 2013-07-02 HISTORY — PX: LEFT HEART CATHETERIZATION WITH CORONARY ANGIOGRAM: SHX5451

## 2013-07-02 LAB — CBC
HCT: 43.1 % (ref 39.0–52.0)
HEMOGLOBIN: 14.5 g/dL (ref 13.0–17.0)
MCH: 28.8 pg (ref 26.0–34.0)
MCHC: 33.6 g/dL (ref 30.0–36.0)
MCV: 85.5 fL (ref 78.0–100.0)
Platelets: 118 10*3/uL — ABNORMAL LOW (ref 150–400)
RBC: 5.04 MIL/uL (ref 4.22–5.81)
RDW: 14.9 % (ref 11.5–15.5)
WBC: 13.1 10*3/uL — ABNORMAL HIGH (ref 4.0–10.5)

## 2013-07-02 LAB — DIFFERENTIAL
BASOS PCT: 0 % (ref 0–1)
Basophils Absolute: 0 10*3/uL (ref 0.0–0.1)
EOS ABS: 0.5 10*3/uL (ref 0.0–0.7)
Eosinophils Relative: 4 % (ref 0–5)
Lymphocytes Relative: 19 % (ref 12–46)
Lymphs Abs: 2.5 10*3/uL (ref 0.7–4.0)
MONO ABS: 1 10*3/uL (ref 0.1–1.0)
Monocytes Relative: 8 % (ref 3–12)
NEUTROS ABS: 9 10*3/uL — AB (ref 1.7–7.7)
Neutrophils Relative %: 69 % (ref 43–77)

## 2013-07-02 LAB — BASIC METABOLIC PANEL
BUN: 18 mg/dL (ref 6–23)
CO2: 23 mEq/L (ref 19–32)
CREATININE: 1.12 mg/dL (ref 0.50–1.35)
Calcium: 8.7 mg/dL (ref 8.4–10.5)
Chloride: 100 mEq/L (ref 96–112)
GFR calc Af Amer: 72 mL/min — ABNORMAL LOW (ref >90)
GFR calc non Af Amer: 62 mL/min — ABNORMAL LOW (ref >90)
Glucose, Bld: 161 mg/dL — ABNORMAL HIGH (ref 70–99)
Potassium: 3.7 mEq/L (ref 3.7–5.3)
Sodium: 140 mEq/L (ref 137–147)

## 2013-07-02 LAB — APTT: APTT: 25 s (ref 24–37)

## 2013-07-02 LAB — TROPONIN I

## 2013-07-02 LAB — PROTIME-INR
INR: 1 (ref 0.00–1.49)
Prothrombin Time: 13 seconds (ref 11.6–15.2)

## 2013-07-02 SURGERY — LEFT HEART CATHETERIZATION WITH CORONARY ANGIOGRAM
Anesthesia: LOCAL

## 2013-07-02 MED ORDER — HEPARIN SODIUM (PORCINE) 5000 UNIT/ML IJ SOLN
INTRAMUSCULAR | Status: AC
  Filled 2013-07-02: qty 1

## 2013-07-02 MED ORDER — NITROGLYCERIN 0.2 MG/ML ON CALL CATH LAB
INTRAVENOUS | Status: AC
  Filled 2013-07-02: qty 1

## 2013-07-02 MED ORDER — TICAGRELOR 90 MG PO TABS
90.0000 mg | ORAL_TABLET | Freq: Two times a day (BID) | ORAL | Status: DC
  Administered 2013-07-03 – 2013-07-05 (×5): 90 mg via ORAL
  Filled 2013-07-02 (×6): qty 1

## 2013-07-02 MED ORDER — MORPHINE SULFATE 2 MG/ML IJ SOLN
1.0000 mg | INTRAMUSCULAR | Status: DC | PRN
  Administered 2013-07-02 – 2013-07-04 (×5): 1 mg via INTRAVENOUS
  Filled 2013-07-02 (×5): qty 1

## 2013-07-02 MED ORDER — TICAGRELOR 90 MG PO TABS
ORAL_TABLET | ORAL | Status: AC
  Filled 2013-07-02: qty 2

## 2013-07-02 MED ORDER — NITROGLYCERIN 0.4 MG SL SUBL
0.4000 mg | SUBLINGUAL_TABLET | SUBLINGUAL | Status: DC | PRN
  Administered 2013-07-02: 0.4 mg via SUBLINGUAL

## 2013-07-02 MED ORDER — FUROSEMIDE 10 MG/ML IJ SOLN
INTRAMUSCULAR | Status: AC
  Filled 2013-07-02: qty 4

## 2013-07-02 MED ORDER — NITROGLYCERIN 2 % TD OINT
1.0000 [in_us] | TOPICAL_OINTMENT | Freq: Once | TRANSDERMAL | Status: AC
  Administered 2013-07-02: 1 [in_us] via TOPICAL
  Filled 2013-07-02: qty 1

## 2013-07-02 MED ORDER — SODIUM CHLORIDE 0.9 % IV SOLN: INTRAVENOUS | Status: AC

## 2013-07-02 MED ORDER — NITROGLYCERIN 2 % TD OINT: 1.0000 [in_us] | TOPICAL_OINTMENT | Freq: Four times a day (QID) | TRANSDERMAL | Status: DC

## 2013-07-02 MED ORDER — AMLODIPINE BESYLATE 10 MG PO TABS
10.0000 mg | ORAL_TABLET | Freq: Every day | ORAL | Status: DC
  Administered 2013-07-03 – 2013-07-05 (×3): 10 mg via ORAL
  Filled 2013-07-02 (×3): qty 1

## 2013-07-02 MED ORDER — LISINOPRIL 5 MG PO TABS
5.0000 mg | ORAL_TABLET | Freq: Every day | ORAL | Status: DC
Start: 2013-07-02 — End: 2013-07-04
  Administered 2013-07-02 – 2013-07-03 (×2): 5 mg via ORAL
  Filled 2013-07-02 (×3): qty 1

## 2013-07-02 MED ORDER — HEPARIN SODIUM (PORCINE) 5000 UNIT/ML IJ SOLN
4000.0000 [IU] | Freq: Once | INTRAMUSCULAR | Status: AC
  Administered 2013-07-02: 4000 [IU] via INTRAVENOUS

## 2013-07-02 MED ORDER — TIROFIBAN HCL IV 5 MG/100ML
INTRAVENOUS | Status: AC
  Filled 2013-07-02: qty 100

## 2013-07-02 MED ORDER — METOPROLOL TARTRATE 25 MG PO TABS
25.0000 mg | ORAL_TABLET | Freq: Two times a day (BID) | ORAL | Status: DC
Start: 2013-07-02 — End: 2013-07-04
  Administered 2013-07-02 – 2013-07-03 (×3): 25 mg via ORAL
  Filled 2013-07-02 (×5): qty 1

## 2013-07-02 MED ORDER — BIVALIRUDIN 250 MG IV SOLR
INTRAVENOUS | Status: AC
  Filled 2013-07-02: qty 250

## 2013-07-02 MED ORDER — HEPARIN (PORCINE) IN NACL 2-0.9 UNIT/ML-% IJ SOLN
INTRAMUSCULAR | Status: AC
  Filled 2013-07-02: qty 1500

## 2013-07-02 MED ORDER — ASPIRIN 81 MG PO CHEW
81.0000 mg | CHEWABLE_TABLET | Freq: Every day | ORAL | Status: DC
  Administered 2013-07-03 – 2013-07-05 (×3): 81 mg via ORAL
  Filled 2013-07-02 (×3): qty 1

## 2013-07-02 MED ORDER — LIDOCAINE HCL (PF) 1 % IJ SOLN
INTRAMUSCULAR | Status: AC
  Filled 2013-07-02: qty 30

## 2013-07-02 NOTE — ED Provider Notes (Signed)
TIME SEEN: 6:43 PM  CHIEF COMPLAINT: STEMI  HPI: Patient is a 79 y.o. M with history of hypertension, prior CVA, CAD with MI 12 years ago who has 1 stent who presents emergency department with chest pressure that he describes as indigestion that started at 5 PM today. He has had associated nausea and shortness of breath. He has had dizziness. No diaphoresis. Initial EKG was at 1808 by EMS and confirmed anterior MI. Patient received 324 mg aspirin by EMS and one nitroglycerin tablet. No arrhythmia. He is hemodynamically stable.  ROS: See HPI Constitutional: no fever  Eyes: no drainage  ENT: no runny nose   Cardiovascular:   chest pain  Resp:  SOB  GI: no vomiting GU: no dysuria Integumentary: no rash  Allergy: no hives  Musculoskeletal: no leg swelling  Neurological: no slurred speech ROS otherwise negative  PAST MEDICAL HISTORY/PAST SURGICAL HISTORY:  Past Medical History  Diagnosis Date  . Hypertension   . CAD (coronary artery disease)   . MI (myocardial infarction)   . S/P coronary artery stent placement   . Hemorrhoids   . Stroke     MEDICATIONS:  Prior to Admission medications   Medication Sig Start Date End Date Taking? Authorizing Provider  amLODipine (NORVASC) 10 MG tablet Take 1 tablet (10 mg total) by mouth daily. 05/01/13   Kathyrn Drown, MD  clopidogrel (PLAVIX) 75 MG tablet TAKE ONE TABLET BY MOUTH ONCE DAILY 05/01/13   Kathyrn Drown, MD  HYDROcodone-acetaminophen (NORCO/VICODIN) 5-325 MG per tablet Take one tablet by mouth twice daily as needed for pain 01/02/13   Kathyrn Drown, MD  metoprolol (LOPRESSOR) 50 MG tablet TAKE ONE TABLET BY MOUTH TWICE DAILY 05/01/13   Kathyrn Drown, MD  traMADol (ULTRAM) 50 MG tablet Take 1 tablet (50 mg total) by mouth every 8 (eight) hours as needed. 05/01/13   Kathyrn Drown, MD    ALLERGIES:  Allergies  Allergen Reactions  . Lipitor [Atorvastatin]     Muscle and joint pain    SOCIAL HISTORY:  History  Substance Use Topics   . Smoking status: Former Research scientist (life sciences)  . Smokeless tobacco: Not on file  . Alcohol Use: Not on file    FAMILY HISTORY: No family history on file.  EXAM: BP 136/52  Pulse 60  Temp(Src) 97.7 F (36.5 C) (Oral)  Resp 18  Ht 5\' 9"  (1.753 m)  Wt 180 lb (81.647 kg)  BMI 26.57 kg/m2  SpO2 92% CONSTITUTIONAL: Alert and oriented and responds appropriately to questions. Well-appearing; well-nourished HEAD: Normocephalic EYES: Conjunctivae clear, PERRL ENT: normal nose; no rhinorrhea; moist mucous membranes; pharynx without lesions noted NECK: Supple, no meningismus, no LAD  CARD: RRR; S1 and S2 appreciated; no murmurs, no clicks, no rubs, no gallops RESP: Normal chest excursion without splinting or tachypnea; breath sounds clear and equal bilaterally; no wheezes, no rhonchi, no rales,  ABD/GI: Normal bowel sounds; non-distended; soft, non-tender, no rebound, no guarding BACK:  The back appears normal and is non-tender to palpation, there is no CVA tenderness EXT: Normal ROM in all joints; non-tender to palpation; no edema; normal capillary refill; no cyanosis    SKIN: Normal color for age and race; warm NEURO: Moves all extremities equally PSYCH: The patient's mood and manner are appropriate. Grooming and personal hygiene are appropriate.  MEDICAL DECISION MAKING: Patient here with anterior STEMI. He is hemodynamically stable and well appearing. Labs pending. Will continue nitroglycerin, give heparin bolus, IV fluids. Discussed with Dr. With  cardiology. Cardiology fellow to assess patient in the ER.  ED PROGRESS: 7:00 PM  Pt is pain-free after 3 nitroglycerin tablets.  Given one inch nitro paste. EKG unchanged. Dr. Kennith Center at bedside.  Waiting to go to catheterization lab.     EKG Interpretation  Date/Time:  Sunday July 02 2013 18:45:52 EDT Ventricular Rate:  67 PR Interval:  186 QRS Duration: 106 QT Interval:  455 QTC Calculation: 480 R Axis:   -43 Text Interpretation:  Sinus rhythm  Inferior infarct, old Extensive anterior infarct, acute (LAD) Baseline wander in lead(s) I III aVL Acute MI  Confirmed by Sanela Evola,  DO, Delight Bickle ([id number]) on 07/02/2013 7:04:51 PM        CRITICAL CARE Performed by: Nyra Jabs   Total critical care time: 30 minutes  Critical care time was exclusive of separately billable procedures and treating other patients.  Critical care was necessary to treat or prevent imminent or life-threatening deterioration.  Critical care was time spent personally by me on the following activities: development of treatment plan with patient and/or surrogate as well as nursing, discussions with consultants, evaluation of patient's response to treatment, examination of patient, obtaining history from patient or surrogate, ordering and performing treatments and interventions, ordering and review of laboratory studies, ordering and review of radiographic studies, pulse oximetry and re-evaluation of patient's condition.   [place], DO 07/03/13 0010

## 2013-07-02 NOTE — ED Notes (Signed)
Cardiology at bedside.

## 2013-07-02 NOTE — H&P (Signed)
Admission History and Physical     Patient ID: [name] [name], MRN: [id number], DOB: 18-[name]-1935 79 y.o. Date of Encounter: 07/02/2013, 8:01 PM  Primary Physician: Sallee Lange, MD Primary Cardiologist: None (Was being followed in [place])  Chief Complaint: STEMI  History of Present Illness: [name] [name] is a 79 y.o. male with a history of Q-Wave infarction in 2002 with stent to the PDA, HTN, Hyperlipidemia. He was in his usual state of health until 5pm today when he noted severe epigastric burning, like indigestion.  Some pain to left arm as well.  Called EMS, and was brought to the [facility] ED where code STEMI was activated on arrival.  His pain was relieved with 3 SL nitroglycerin.  He received 324mg  of aspirin and a heparin bolus in the ED as well.  Anterior ST elevation on EKG with inferior q-waves.  Vitals stable in ED and CP free by the time of cath lab arrival.     Past Medical History  Diagnosis Date  . Hypertension   . CAD (coronary artery disease)   . MI (myocardial infarction)   . S/P coronary artery stent placement   . Hemorrhoids   . Stroke      Past Surgical History  Procedure Laterality Date  . Colonoscopy        No current facility-administered medications on file prior to encounter.   Current Outpatient Prescriptions on File Prior to Encounter  Medication Sig Dispense Refill  . amLODipine (NORVASC) 10 MG tablet Take 1 tablet (10 mg total) by mouth daily.  90 tablet  1  . HYDROcodone-acetaminophen (NORCO/VICODIN) 5-325 MG per tablet Take one tablet by mouth twice daily as needed for pain  60 tablet  0  . traMADol (ULTRAM) 50 MG tablet Take 1 tablet (50 mg total) by mouth every 8 (eight) hours as needed.  90 tablet  5     Allergies: Allergies  Allergen Reactions  . Lipitor [Atorvastatin] Other (See Comments)    Muscle and joint pain     Social History:  The patient  reports that he has quit smoking. He does not have any smokeless tobacco history  on file.   Family History:  The patient's family history is not on file.   ROS:  Please see the history of present illness.     All other systems reviewed and negative.   Vital Signs: Blood pressure 131/74, pulse 61, temperature 97.7 F (36.5 C), temperature source Oral, resp. rate 17, height 5\' 9"  (1.753 m), weight 81.647 kg (180 lb), SpO2 99.00%.  PHYSICAL EXAM: General:  Well nourished, well developed, in no acute distress HEENT: normal Lymph: no adenopathy Neck: no JVD Endocrine:  No thryomegaly Vascular: No carotid bruits; FA pulses 2+ bilaterally, bruit noted R side.  L radial pulse 2+, R radial 1+. Cardiac:  normal S1, S2; RRR; no murmur Lungs:  clear to auscultation bilaterally, no wheezing, rhonchi or rales Abd: soft, nontender, no hepatomegaly Ext: no edema Musculoskeletal:  No deformities, BUE and BLE strength normal and equal Skin: warm and dry Neuro:  CNs 2-12 intact, no focal abnormalities noted Psych:  Normal affect   EKG:   Normal sinus rhythm.  71mm ST elevation anteriorly with q waves (old) in inferior and anterior leads)  Labs:   Lab Results  Component Value Date   WBC 13.1* 07/02/2013   HGB 14.5 07/02/2013   HCT 43.1 07/02/2013   MCV 85.5 07/02/2013   PLT 118* 07/02/2013    Recent Labs  Lab 07/02/13 1856  NA 140  K 3.7  CL 100  CO2 23  BUN 18  CREATININE 1.12  CALCIUM 8.7  GLUCOSE 161*   No results found for this basename: CKTOTAL, CKMB, TROPONINI,  in the last 72 hours Lab Results  Component Value Date   CHOL 138 12/27/2012   HDL 29* 12/27/2012   LDLCALC 71 12/27/2012   TRIG 192* 12/27/2012   No results found for this basename: DDIMER    Radiology/Studies:  No results found.   ASSESSMENT AND PLAN:   1.  STEMI: S/P 110mm DES to prox/mid LAD via R femoral approach.  See Procedure note by [name] for full details.  LVEF lower limit of normal at 50% on ventriculogram. Continue asa, ticagralor. Metoprolol, lisinopril.  Prior allergy to lipitor  (significant myalgias).  Would consider adding pravastatin or crestor + zetia prior to discharge. To CCU for monitoring overnight.  2.  HTN: Substitute ACE-I for home norvasc, continue home metoprolol  3.  Significant peripheral vascular disease On further history does have claudication in both legs limiting exertion, previously thought to be arthritis Medical management as above.  To follow up with [name] about intervention electively.      Signed,  Stephani Police, MD 07/02/2013, 8:01 PM

## 2013-07-02 NOTE — CV Procedure (Signed)
DWAYN MORAVEK is a 79 y.o. male    [id number] LOCATION:  FACILITY: [facility]  PHYSICIAN: Quay Burow, M.D. 1934-04-07   DATE OF PROCEDURE:  07/02/2013  DATE OF DISCHARGE:     CARDIAC CATHETERIZATION     History obtained from chart review. Mr. Dubose is a 79 year old Caucasian male with a remote history of CAD status post myocardial infarction present 12 years ago. He has a history of hypertension. He developed chest pain beginning at 5:00 this afternoon. He presented to [facility] emergency room where an EKG showed anterior ST segment elevation. He was brought urgently to the cath lab for angiography and intervention.   PROCEDURE DESCRIPTION:   The patient was brought to the second floor [facility] cath lab in the postabsorptive state. He was not premedicated. His right groinwas prepped and shaved in usual sterile fashion. Xylocaine 1% was used for local anesthesia. A 6 French sheath was inserted into the right common femoral artery using standard Seldinger technique. 6 French right and left Judkins diagnostic catheters along with a 6 French pigtail catheter were used for selective coronary angiography, left ventriculography and distal abdominal aortography. Visipaque dye was used for the entirety of the case. Retrograde aortic, left ventricular and pullback pressures were recorded.   HEMODYNAMICS:    AO SYSTOLIC/AO DIASTOLIC: 993/71   LV SYSTOLIC/LV DIASTOLIC: 696/78  ANGIOGRAPHIC RESULTS:   1. Left main; normal  2. LAD; 80% segmental proximal after the first large diagonal branch with visible thrombus and TIMI 2 flow. The diagonal branch had a proximal 80% ostial stenosis 3. Left circumflex; nondominant and free of significant disease.  4. Right coronary artery; dominant and occluded proximally. There was grade 2 left-to-right collaterals. 5. Left ventriculography; RAO left ventriculogram was performed using  25 mL of Visipaque dye at 12 mL/second. The  overall LVEF estimated  50 %Without wall motion abnormalities  IMPRESSION:Mr. Reinig has clot in his proximal LAD and a long high-grade lesion with TIMI 2 flow. He has a total RCA with left to right collaterals. We'll plan to perform aspiration thrombectomy, PTCI stenting using drug-eluting stent, Angiomax, Aggrastat and Brilenta.  Procedure description: the patient received Angiomax bolus with an ACT of 382. Total contrast administered the patient was 215 cc. Using a 6 [place] XB LAD 3.5 cm catheter along with a 0.14/190 cm long Pro Waterr guidewire and a Pronto aspiration catheter multiple passes were performed in the proximal LAD removing a significant amount of visible thrombus. Following aspiration thrombectomy there was TIMI-3 flow. After this balloon angioplasty was performed with a 2 mm upgrading  To a  2.5 x 20 mm balloon. An Alpine Xience 2.5 x 33 mm drug-eluting stent was then carefully positioned and deployed at 16-18 atmospheres and post dilated with a 2.75 x 20 mm long Dwale balloon at 18 atmospheres (2.84 mm) resulting in reduction of an 80-85% proximal LAD stenosis with TIMI 2 flow to 0% residual TIMI-3 flow. The patient did have a IV ER with resulting decrease in blood pressure which ultimately resolved. The door to balloon time (DTBT) was 28 minutes.  Final impression: Successful aspiration thrombectomy, PCI and stenting of proximal LAD in setting of anterior STEMI with work to balloon time of 20 minutes. The patient did get a bolus of Aggrastat given the significant amount of thrombus. He was hemodynamically stable throughout the case. The guidewire and catheter were removed and the sheath was sewn securely in place. He will be treated with a total antibiotic therapy  including low-dose aspirin and Brilenta along with beta blocker at, ACE inhibitor and statin drug. He does have significant peripheral vascular disease with an occluded left iliac and high-grade right common iliac artery  stenosis which I can deal with on an elective basis.   Lorretta Harp MD, [facility] 07/02/2013 8:50 PM

## 2013-07-02 NOTE — ED Notes (Signed)
Per EMS, around 1700 patient began having chest pain described as indegestion. Pt. Then became clammy and called EMS. Denies SOb. Given 324 ASA, 1 nitro, and 8 morhpine. Hx of MI

## 2013-07-02 NOTE — ED Notes (Signed)
Pt. Placed on 2L O2 

## 2013-07-02 NOTE — H&P (Signed)
  H & P will be scanned in.  Pt was reexamined and existing H & P reviewed. No changes found.  Lorretta Harp, MD [facility] 07/02/2013 7:31 PM

## 2013-07-02 NOTE — Progress Notes (Signed)
Chaplain provided emotional support to pt's wife, per referral from nurse. Will follow up as necessary.

## 2013-07-03 DIAGNOSIS — E785 Hyperlipidemia, unspecified: Secondary | ICD-10-CM | POA: Diagnosis not present

## 2013-07-03 DIAGNOSIS — I2109 ST elevation (STEMI) myocardial infarction involving other coronary artery of anterior wall: Secondary | ICD-10-CM | POA: Diagnosis not present

## 2013-07-03 DIAGNOSIS — I219 Acute myocardial infarction, unspecified: Secondary | ICD-10-CM

## 2013-07-03 DIAGNOSIS — I10 Essential (primary) hypertension: Secondary | ICD-10-CM | POA: Diagnosis not present

## 2013-07-03 DIAGNOSIS — I739 Peripheral vascular disease, unspecified: Secondary | ICD-10-CM | POA: Diagnosis not present

## 2013-07-03 LAB — BASIC METABOLIC PANEL
BUN: 18 mg/dL (ref 6–23)
CALCIUM: 8.6 mg/dL (ref 8.4–10.5)
CO2: 23 mEq/L (ref 19–32)
CREATININE: 0.98 mg/dL (ref 0.50–1.35)
Chloride: 100 mEq/L (ref 96–112)
GFR calc Af Amer: >90 mL/min (ref >90)
GFR, EST NON AFRICAN AMERICAN: 78 mL/min — AB (ref >90)
GLUCOSE: 122 mg/dL — AB (ref 70–99)
Potassium: 4.1 mEq/L (ref 3.7–5.3)
Sodium: 141 mEq/L (ref 137–147)

## 2013-07-03 LAB — CBC
HCT: 42.5 % (ref 39.0–52.0)
Hemoglobin: 14.5 g/dL (ref 13.0–17.0)
MCH: 28.8 pg (ref 26.0–34.0)
MCHC: 34.1 g/dL (ref 30.0–36.0)
MCV: 84.3 fL (ref 78.0–100.0)
PLATELETS: 105 10*3/uL — AB (ref 150–400)
RBC: 5.04 MIL/uL (ref 4.22–5.81)
RDW: 14.8 % (ref 11.5–15.5)
WBC: 10.9 10*3/uL — ABNORMAL HIGH (ref 4.0–10.5)

## 2013-07-03 LAB — MRSA PCR SCREENING: MRSA by PCR: NEGATIVE

## 2013-07-03 LAB — TROPONIN I: Troponin I: >20 ng/mL (ref <0.30)

## 2013-07-03 LAB — POCT ACTIVATED CLOTTING TIME: Activated Clotting Time: 382 seconds

## 2013-07-03 MED ORDER — ATROPINE SULFATE 0.1 MG/ML IJ SOLN
INTRAMUSCULAR | Status: AC
  Filled 2013-07-03: qty 10

## 2013-07-03 MED ORDER — ROSUVASTATIN CALCIUM 10 MG PO TABS
10.0000 mg | ORAL_TABLET | Freq: Every day | ORAL | Status: DC
  Administered 2013-07-03 – 2013-07-04 (×2): 10 mg via ORAL
  Filled 2013-07-03 (×3): qty 1

## 2013-07-03 MED FILL — Sodium Chloride IV Soln 0.9%: INTRAVENOUS | Qty: 50 | Status: AC

## 2013-07-03 NOTE — Progress Notes (Signed)
Sheath in right groin level 1 d/t oozing. VS stable. Sheath pulled with manual pressure applied for 30 minutes with no complications. VS stable throughout. Pressure dressing applied and bedrest instructions given with teach back. Groin level 0.

## 2013-07-03 NOTE — Progress Notes (Signed)
CARDIAC REHAB PHASE I   PRE:  Rate/Rhythm: 43 SR  BP:  Supine: 156/57  Sitting:   Standing:    SaO2:   MODE:  Ambulation: chair ft   POST:  Rate/Rhythm: 84 SR  BP:  Supine:   Sitting: 171/67  Standing:    SaO2:  1010-1105 Came to see pt. Talked with pt's RN. Since bedrest just up, asked that I get to chair and they will walk later today if stable. Pt tolerated walking to recliner without CP or dizziness. Began MI ed. Reviewed stent/brilinta and gave booklets., NTG use, MI restrictions and left heart healthy diet. Will continue ed tomorrow and ambulate. Graylon Good RN BSN 07/03/2013 11:03 AM     Graylon Good, RN BSN  07/03/2013 11:00 AM

## 2013-07-03 NOTE — Progress Notes (Signed)
Patient Name: [name] [name] Date of Encounter: 07/03/2013     Principal Problem:   STEMI (ST elevation myocardial infarction) Active Problems:   Hyperlipidemia   Essential hypertension, benign   Peripheral vascular disease    SUBJECTIVE The patient presented yesterday with code STEMI secondary to clot in his proximal LAD.  Underwent PCI with drug-eluting stent.  Patient feels well today with no chest discomfort.  Rhythm is stable normal sinus rhythm.   CURRENT MEDS . amLODipine  10 mg Oral Daily  . aspirin  81 mg Oral Daily  . atropine      . lisinopril  5 mg Oral Daily  . metoprolol tartrate  25 mg Oral BID  . rosuvastatin  10 mg Oral q1800  . Ticagrelor  90 mg Oral BID    OBJECTIVE  Filed Vitals:   07/03/13 0400 07/03/13 0430 07/03/13 0500 07/03/13 0600  BP: 177/62 175/73 162/67 171/82  Pulse: 65 65 68 68  Temp:      TempSrc:      Resp: 17 15 14 17   Height:      Weight:      SpO2: 96% 97% 95% 94%    Intake/Output Summary (Last 24 hours) at 07/03/13 0857 Last data filed at 07/03/13 0700  Gross per 24 hour  Intake  576.4 ml  Output   2250 ml  Net -1673.6 ml   Filed Weights   07/02/13 1855 07/02/13 2145  Weight: 180 lb (81.647 kg) 185 lb 10 oz (84.2 kg)    PHYSICAL EXAM  General: Pleasant, NAD. Neuro: Alert and oriented X 3. Moves all extremities spontaneously. Psych: Normal affect. HEENT:  Normal  Neck: Supple without bruits or JVD. Lungs:  Resp regular and unlabored, CTA. Heart: RRR no s3, s4, or murmurs. Abdomen: Soft, non-tender, non-distended, BS + x 4.  Catheterization site is stable Extremities: No clubbing, cyanosis or edema. DP/PT/Radials 2+ and equal bilaterally.  Accessory Clinical Findings  CBC  Recent Labs  07/02/13 1856 07/03/13 0251  WBC 13.1* 10.9*  NEUTROABS 9.0*  --   HGB 14.5 14.5  HCT 43.1 42.5  MCV 85.5 84.3  PLT 118* 099*   Basic Metabolic Panel  Recent Labs  07/02/13 1856 07/03/13 0251  NA 140 141  K  3.7 4.1  CL 100 100  CO2 23 23  GLUCOSE 161* 122*  BUN 18 18  CREATININE 1.12 0.98  CALCIUM 8.7 8.6   Liver Function Tests No results found for this basename: AST, ALT, ALKPHOS, BILITOT, PROT, ALBUMIN,  in the last 72 hours No results found for this basename: LIPASE, AMYLASE,  in the last 72 hours Cardiac Enzymes  Recent Labs  07/02/13 2210  TROPONINI >20.00*   BNP No components found with this basename: POCBNP,  D-Dimer No results found for this basename: DDIMER,  in the last 72 hours Hemoglobin A1C No results found for this basename: HGBA1C,  in the last 72 hours Fasting Lipid Panel No results found for this basename: CHOL, HDL, LDLCALC, TRIG, CHOLHDL, LDLDIRECT,  in the last 72 hours Thyroid Function Tests No results found for this basename: TSH, T4TOTAL, FREET3, T3FREE, THYROIDAB,  in the last 72 hours  TELE  Normal sinus rhythm  ECG  Post cardiac catheterization EKG shows improvement in ST segment elevation  Radiology/Studies  No results found.  ASSESSMENT AND PLAN  1. anterior wall STEMI secondary to occlusion in proximal LAD status post successful PCI with drug-eluting stent. 2. peripheral vascular disease with occluded left  iliac and high grade right common iliac artery stenosis which will be with later on an elective basis  Plan: Continue aspirin, Brilinta, ACE inhibitor, beta blocker.  The patient is intolerant of Lipitor because of severe myalgias.  We talked with him at length today.  He is willing to try a low dose of a different statin.  We will start Crestor 10 mg daily. We will plan to increase activity today.  We will ask cardiac rehabilitation to see him Signed, Darlin Coco MD

## 2013-07-03 NOTE — Progress Notes (Signed)
Chaplain gave support to pt, pt's wife and pt's son through compassionate conversation and listening.  Chaplain offered a prayer with pt and family.   07/03/13 1600  Clinical Encounter Type  Visited With Patient and family together  Visit Type Spiritual support  Spiritual Encounters  Spiritual Needs Prayer;Emotional   Estelle June, chaplain [phone]

## 2013-07-03 NOTE — Care Management Note (Signed)
    Page 1 of 1   07/03/2013     11:01:00 AM   CARE MANAGEMENT NOTE 07/03/2013  Patient:  [name] [name], [name] [name]   Account Number:  [id number]  Date Initiated:  07/03/2013  Documentation initiated by:  Elissa Hefty  Subjective/Objective Assessment:   adm w mi     Action/Plan:   lives w wife, pcp dr Nicki Reaper [place]   Anticipated DC Date:     Anticipated DC Plan:        [place]  CM consult  Medication Assistance      Choice offered to / List presented to:             Status of service:   Medicare Important Message given?   (If response is "NO", the following Medicare IM given date fields will be blank) Date Medicare IM given:   Date Additional Medicare IM given:    Discharge Disposition:    Per UR Regulation:  Reviewed for med. necessity/level of care/duration of stay  If discussed at [place] of Stay Meetings, dates discussed:    Comments:  4/6 58a debbie Jakyrie Totherow rn,bsn pt was given brilinta 30day free card by card rehab.

## 2013-07-04 DIAGNOSIS — I739 Peripheral vascular disease, unspecified: Secondary | ICD-10-CM | POA: Diagnosis not present

## 2013-07-04 DIAGNOSIS — I10 Essential (primary) hypertension: Secondary | ICD-10-CM | POA: Diagnosis not present

## 2013-07-04 DIAGNOSIS — I2109 ST elevation (STEMI) myocardial infarction involving other coronary artery of anterior wall: Secondary | ICD-10-CM | POA: Diagnosis not present

## 2013-07-04 DIAGNOSIS — E785 Hyperlipidemia, unspecified: Secondary | ICD-10-CM | POA: Diagnosis not present

## 2013-07-04 LAB — TROPONIN I: Troponin I: 16.4 ng/mL (ref <0.30)

## 2013-07-04 MED ORDER — TRAMADOL HCL 50 MG PO TABS
50.0000 mg | ORAL_TABLET | Freq: Three times a day (TID) | ORAL | Status: DC | PRN
  Administered 2013-07-04 – 2013-07-05 (×2): 50 mg via ORAL
  Filled 2013-07-04 (×2): qty 1

## 2013-07-04 MED ORDER — METOPROLOL TARTRATE 50 MG PO TABS
50.0000 mg | ORAL_TABLET | Freq: Two times a day (BID) | ORAL | Status: DC
  Administered 2013-07-04 – 2013-07-05 (×3): 50 mg via ORAL
  Filled 2013-07-04 (×4): qty 1

## 2013-07-04 MED ORDER — LISINOPRIL 10 MG PO TABS
10.0000 mg | ORAL_TABLET | Freq: Every day | ORAL | Status: DC
  Administered 2013-07-04 – 2013-07-05 (×2): 10 mg via ORAL
  Filled 2013-07-04 (×2): qty 1

## 2013-07-04 NOTE — Progress Notes (Signed)
CARDIAC REHAB PHASE I   PRE:  Rate/Rhythm: 95SR  BP:  Supine:   Sitting: 159/60  Standing:    SaO2:   MODE:  Ambulation: 350 ft   POST:  Rate/Rhythm: 129 ST  BP:  Supine:   Sitting: 175/78  Standing:    SaO2:  1032-1140 Pt walked 350 ft on RA with hand held asst. Stopped a couple of times to rest. Heart rate elevated. To recliner after walk. Education completed. Discussed CRP 2. Pt declined at this time due to distance. Left brochure in case pt changes his mind.   Graylon Good, RN BSN  07/04/2013 11:36 AM

## 2013-07-04 NOTE — Progress Notes (Signed)
Patient Name: [name] [name] Date of Encounter: 07/04/2013     Principal Problem:   STEMI (ST elevation myocardial infarction) Active Problems:   Hyperlipidemia   Essential hypertension, benign   Peripheral vascular disease    SUBJECTIVE  Patient is feeling well.  No chest pain.  He is having moderate arthritic discomfort in his arms and legs.  He takes tramadol for this at home as well as acetaminophen. Rhythm is sinus tachycardia.  EKG shows evolutionary changes of anteroseptal myocardial infarction.  We will get an echocardiogram today.  CURRENT MEDS . amLODipine  10 mg Oral Daily  . aspirin  81 mg Oral Daily  . lisinopril  10 mg Oral Daily  . metoprolol tartrate  50 mg Oral BID  . rosuvastatin  10 mg Oral q1800  . Ticagrelor  90 mg Oral BID    OBJECTIVE  Filed Vitals:   07/04/13 0300 07/04/13 0400 07/04/13 0600 07/04/13 0800  BP:  170/54 179/57 173/69  Pulse:      Temp:  98.6 F (37 C)  98.4 F (36.9 C)  TempSrc:  Oral  Oral  Resp: 20 14 12 12   Height:      Weight:      SpO2:  98%  100%    Intake/Output Summary (Last 24 hours) at 07/04/13 0904 Last data filed at 07/04/13 0800  Gross per 24 hour  Intake    726 ml  Output   1400 ml  Net   -674 ml   Filed Weights   07/02/13 1855 07/02/13 2145  Weight: 180 lb (81.647 kg) 185 lb 10 oz (84.2 kg)    PHYSICAL EXAM  General: Pleasant, NAD. Neuro: Alert and oriented X 3. Moves all extremities spontaneously. Psych: Normal affect. HEENT:  Normal  Neck: Supple without bruits or JVD. Lungs:  Resp regular and unlabored, CTA. Heart: RRR no s3, s4, or murmurs. Abdomen: Soft, non-tender, non-distended, BS + x 4.  Extremities: No clubbing, cyanosis or edema. DP/PT/Radials 2+ and equal bilaterally.  Accessory Clinical Findings  CBC  Recent Labs  07/02/13 1856 07/03/13 0251  WBC 13.1* 10.9*  NEUTROABS 9.0*  --   HGB 14.5 14.5  HCT 43.1 42.5  MCV 85.5 84.3  PLT 118* 025*   Basic Metabolic  Panel  Recent Labs  07/02/13 1856 07/03/13 0251  NA 140 141  K 3.7 4.1  CL 100 100  CO2 23 23  GLUCOSE 161* 122*  BUN 18 18  CREATININE 1.12 0.98  CALCIUM 8.7 8.6   Liver Function Tests No results found for this basename: AST, ALT, ALKPHOS, BILITOT, PROT, ALBUMIN,  in the last 72 hours No results found for this basename: LIPASE, AMYLASE,  in the last 72 hours Cardiac Enzymes  Recent Labs  07/02/13 2210 07/03/13 0954 07/04/13 0421  TROPONINI >20.00* >20.00* 16.40*   BNP No components found with this basename: POCBNP,  D-Dimer No results found for this basename: DDIMER,  in the last 72 hours Hemoglobin A1C No results found for this basename: HGBA1C,  in the last 72 hours Fasting Lipid Panel No results found for this basename: CHOL, HDL, LDLCALC, TRIG, CHOLHDL, LDLDIRECT,  in the last 72 hours Thyroid Function Tests No results found for this basename: TSH, T4TOTAL, FREET3, T3FREE, THYROIDAB,  in the last 72 hours  TELE  Sinus tachycardia  ECG  Normal sinus rhythm.  evolutionary changes of recent anteroseptal myocardial infarction.  Radiology/Studies  No results found.  ASSESSMENT AND PLAN 1. anterior wall STEMI  secondary to occlusion in proximal LAD status post successful PCI with drug-eluting stent.  2. peripheral vascular disease with occluded left iliac and high grade right common iliac artery stenosis which will be with later on an elective basis.  Plan: Continue aspirin, Brilinta, ACE inhibitor, beta blocker. The patient is intolerant of Lipitor because of severe myalgias.  We have started him on low-dose Crestor. Pulse and blood pressure are elevated.  We will increase dose of beta blocker and ACE inhibitor today.  We will check an echocardiogram today.  Cardiac rehabilitation we'll ambulate him in the hall today.  Anticipate home tomorrow if he remains stable    Signed, Darlin Coco MD

## 2013-07-05 ENCOUNTER — Other Ambulatory Visit: Payer: Self-pay | Admitting: Cardiology

## 2013-07-05 ENCOUNTER — Encounter (HOSPITAL_COMMUNITY): Payer: Self-pay | Admitting: Cardiology

## 2013-07-05 DIAGNOSIS — I10 Essential (primary) hypertension: Secondary | ICD-10-CM

## 2013-07-05 DIAGNOSIS — E785 Hyperlipidemia, unspecified: Secondary | ICD-10-CM

## 2013-07-05 DIAGNOSIS — I739 Peripheral vascular disease, unspecified: Secondary | ICD-10-CM

## 2013-07-05 DIAGNOSIS — I517 Cardiomegaly: Secondary | ICD-10-CM

## 2013-07-05 DIAGNOSIS — Z8673 Personal history of transient ischemic attack (TIA), and cerebral infarction without residual deficits: Secondary | ICD-10-CM

## 2013-07-05 MED ORDER — ASPIRIN 81 MG PO CHEW: 81.0000 mg | CHEWABLE_TABLET | Freq: Every day | ORAL | Status: DC

## 2013-07-05 MED ORDER — NITROGLYCERIN 0.4 MG SL SUBL: 0.4000 mg | SUBLINGUAL_TABLET | SUBLINGUAL | Status: DC | PRN

## 2013-07-05 MED ORDER — ROSUVASTATIN CALCIUM 10 MG PO TABS: 10.0000 mg | ORAL_TABLET | Freq: Every day | ORAL | Status: DC

## 2013-07-05 MED ORDER — LISINOPRIL 10 MG PO TABS: 10.0000 mg | ORAL_TABLET | Freq: Every day | ORAL | Status: DC

## 2013-07-05 MED ORDER — TICAGRELOR 90 MG PO TABS: 90.0000 mg | ORAL_TABLET | Freq: Two times a day (BID) | ORAL | Status: DC

## 2013-07-05 NOTE — Discharge Summary (Signed)
Physician Discharge Summary  Patient ID: [name] [name] MRN: [id number] DOB/AGE: 1934-09-05 79 y.o.  Admit date: 07/02/2013 Discharge date: 07/05/2013  Admission Diagnoses: STEMI  Discharge Diagnoses:  Principal Problem:   STEMI (ST elevation myocardial infarction) Active Problems:   Hyperlipidemia   Essential hypertension, benign   Peripheral vascular disease   Discharged Condition: stable  Hospital Course: [name] [name] is a 79 y.o. 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 He was admitted on 07/02/13 for Anterior STEMI. Presenting symptoms included severe epigastric burning pain/ indigestion and left arm pain.  Initial troponin was > 20.0. He underwent emergent LHC and successful aspiration thrombectomy, PCI and stenting of proximal LAD by [name], via the right femoral artery (complete angiographic details listed below). The overall LVEF was estimated at 50%, without wall motion abnormalities. He tolerated the procedure well and left the cath lab in stable condition. DAPT with ASA and Brilinta was initiated. He was also placed on a BB, ACE-I and statin. His post PCI course was w/o complication. The right femoral access site remained stable, free from hematoma and bruit. He had no further chest pain. His troponin levels decreased. Cardiac rehab was consulted to assist with ambulation. He had no difficulties. He was last seen and examined by [name], who determined he was stable for discharge home.    It should also be noted that the patient has know PAD with bilateral lower extremity claudication. Bilateral LEAs have been arranged to be completed prior to presenting for post-hospital f/u, which will be 07/21/13 with Lyda Jester, PA-C. [name] will review his arterial doppler results and will determine plan for treating his PAD.     Consults: None  Significant Diagnostic Studies:  Emergent LHC 07/02/13 HEMODYNAMICS:    AO SYSTOLIC/AO DIASTOLIC: 102/58  LV SYSTOLIC/LV DIASTOLIC: 527/78  ANGIOGRAPHIC RESULTS:  1. Left main; normal  2. LAD; 80% segmental proximal after the first large diagonal branch with visible thrombus and TIMI 2 flow. The diagonal branch had a proximal 80% ostial stenosis  3. Left circumflex; nondominant and free of significant disease.  4. Right coronary artery; dominant and occluded proximally. There was grade 2 left-to-right collaterals.  5. Left ventriculography; RAO left ventriculogram was performed using  25 mL of Visipaque dye at 12 mL/second. The overall LVEF estimated  50 %Without wall motion abnormalities     2D echo 07/05/13 Study Conclusions  - Left ventricle: Paradoxical septal motion makes evaluation of distal septum difficult for RWMA's. There is a suggestion of possible hypokinesis of distal septum The cavity size was normal. There was moderate concentric hypertrophy. Systolic function was normal. The estimated ejection fraction was in the range of 55% to 60%. Wall motion was normal; there were no regional wall motion abnormalities. There was an increased relative contribution of atrial contraction to ventricular filling. Doppler parameters are consistent with abnormal left ventricular relaxation (grade 1 diastolic dysfunction). - Ventricular septum: Septal motion showed paradox. - Left atrium: The atrium was mildly dilated. - Pulmonary arteries: PA peak pressure: 79mm Hg (S).    Treatments: See Hospital Course  Discharge Exam: Blood pressure 171/73, pulse 81, temperature 98.4 F (36.9 C), temperature source Oral, resp. rate 18, height 5\' 8"  (1.727 m), weight 180 lb 14.4 oz (82.056 kg), SpO2 94.00%.   Disposition: 01-Home or Self Care      Discharge Orders   Future Appointments Provider Department Dept Phone   07/21/2013 9:30 AM Audrey Eller  Ladoris Gene [facility] [phone]   10/31/2013 10:30 AM [facility]  [address] [phone]   10/31/2013 11:45 AM Mal Misty, MD [facility] [phone]   Future Orders Complete By Expires   Diet - low sodium heart healthy  As directed    Increase activity slowly  As directed        Medication List    STOP taking these medications       clopidogrel 75 MG tablet  Commonly known as:  PLAVIX      TAKE these medications       amLODipine 10 MG tablet  Commonly known as:  NORVASC  Take 1 tablet (10 mg total) by mouth daily.     aspirin 81 MG chewable tablet  Chew 1 tablet (81 mg total) by mouth daily.     HYDROcodone-acetaminophen 5-325 MG per tablet  Commonly known as:  NORCO/VICODIN  Take one tablet by mouth twice daily as needed for pain     lisinopril 10 MG tablet  Commonly known as:  PRINIVIL,ZESTRIL  Take 1 tablet (10 mg total) by mouth daily.     methylcellulose 1 % ophthalmic solution  Commonly known as:  ARTIFICIAL TEARS  Place 1 drop into both eyes as needed (for dry eyes).     metoprolol 50 MG tablet  Commonly known as:  LOPRESSOR  Take 50 mg by mouth 2 (two) times daily.     nitroGLYCERIN 0.4 MG SL tablet  Commonly known as:  NITROSTAT  Place 1 tablet (0.4 mg total) under the tongue every 5 (five) minutes as needed for chest pain.     rosuvastatin 10 MG tablet  Commonly known as:  CRESTOR  Take 1 tablet (10 mg total) by mouth daily at 6 PM.     Ticagrelor 90 MG Tabs tablet  Commonly known as:  BRILINTA  Take 1 tablet (90 mg total) by mouth 2 (two) times daily.     Ticagrelor 90 MG Tabs tablet  Commonly known as:  BRILINTA  Take 1 tablet (90 mg total) by mouth 2 (two) times daily.     traMADol 50 MG tablet  Commonly known as:  ULTRAM  Take 1 tablet (50 mg total) by mouth every 8 (eight) hours as needed.       Follow-up Information   Follow up with Lyda Jester, PA-C On 07/21/2013. (9:30 am (Dr. Kennon Holter PA). )    Specialty:  Cardiology   Contact information:   [place] [place] [address] [phone]       Follow up with [facility]. (you will need to get ultrasounds of your legs several days before your follow-up visit on 4/24. Our office will call you to arrange an appointment)    Specialty:  Cardiology   Contact information:   [address] [place] [address] [place]: > 30 MINUTES  Signed: Lyda Jester 07/05/2013, 12:30 PM

## 2013-07-05 NOTE — Progress Notes (Signed)
CARDIAC REHAB PHASE I   PRE:  Rate/Rhythm: 86 SR  BP:  Supine:   Sitting: 138/74  Standing:    SaO2:   MODE:  Ambulation: 600 ft   POST:  Rate/Rhythm: 105 ST  BP:  Supine:   Sitting: 170/76  Standing:    SaO2:  1010-1033 Pt walked 600 ft on RA with asst x 1 with steady gait. Tolerated well. No CP . Heart rate better controlled this am with activity.    Graylon Good, RN BSN  07/05/2013 10:31 AM

## 2013-07-05 NOTE — Progress Notes (Signed)
Patient Profile: [name] [name] is a 79 y.o. male with a history of Q-Wave infarction in 2002 with stent to the PDA, HTN, Hyperlipidemia. He was admitted on 07/02/13 for Anterior STEMI. Presenting symptoms included severe epigastric burning pain/ indigestion and left arm pain. He underwent successful aspiration thrombectomy, PCI and stenting of proximal LAD by [name]. Was placed on DAPT with ASA + Brilinta.  Troponin peaked > 20.0   Subjective: Feels better. W/o complaints. Denies recurrent CP/SOB. Has been ambulating w/ Cardiac rehab w/o difficulty.   Objective: Vital signs in last 24 hours: Temp:  [98 F (36.7 C)-98.6 F (37 C)] 98.4 F (36.9 C) (04/08 0548) Pulse Rate:  [81-92] 81 (04/08 0548) Resp:  [17-26] 18 (04/08 0548) BP: (130-171)/(57-92) 171/73 mmHg (04/08 0548) SpO2:  [94 %-98 %] 94 % (04/08 0548) Weight:  [180 lb 14.4 oz (82.056 kg)] 180 lb 14.4 oz (82.056 kg) (04/07 2043) Last BM Date: 07/04/13  Intake/Output from previous day: 04/07 0701 - 04/08 0700 In: 960 [P.O.:960] Out: 200 [Urine:200] Intake/Output this shift:    Medications Current Facility-Administered Medications  Medication Dose Route Frequency Provider Last Rate Last Dose  . amLODipine (NORVASC) tablet 10 mg  10 mg Oral Daily Lorretta Harp, MD   10 mg at 07/04/13 1035  . aspirin chewable tablet 81 mg  81 mg Oral Daily Lorretta Harp, MD   81 mg at 07/04/13 1037  . lisinopril (PRINIVIL,ZESTRIL) tablet 10 mg  10 mg Oral Daily Darlin Coco, MD   10 mg at 07/04/13 1035  . metoprolol (LOPRESSOR) tablet 50 mg  50 mg Oral BID Darlin Coco, MD   50 mg at 07/04/13 2142  . morphine 2 MG/ML injection 1 mg  1 mg Intravenous Q1H PRN Lorretta Harp, MD   1 mg at 07/04/13 1258  . nitroGLYCERIN (NITROSTAT) SL tablet 0.4 mg  0.4 mg Sublingual Q5 min PRN Kristen N Ward, DO   0.4 mg at 07/02/13 1853  . rosuvastatin (CRESTOR) tablet 10 mg  10 mg Oral q1800 Darlin Coco, MD   10 mg at 07/04/13 1728    . Ticagrelor (BRILINTA) tablet 90 mg  90 mg Oral BID Lorretta Harp, MD   90 mg at 07/04/13 2142  . traMADol (ULTRAM) tablet 50 mg  50 mg Oral Q8H PRN Benjamine Sprague [place], RPH   50 mg at 07/04/13 1258    PE: General appearance: alert, cooperative and no distress Lungs: clear to auscultation bilaterally Heart: regular rate and rhythm, S1, S2 normal, no murmur, click, rub or gallop Extremities: no LEE Pulses: 2+ and symmetric Skin: warm and dry Neurologic: Grossly normal  Lab Results:   Recent Labs  07/02/13 1856 07/03/13 0251  WBC 13.1* 10.9*  HGB 14.5 14.5  HCT 43.1 42.5  PLT 118* 105*   BMET  Recent Labs  07/02/13 1856 07/03/13 0251  NA 140 141  K 3.7 4.1  CL 100 100  CO2 23 23  GLUCOSE 161* 122*  BUN 18 18  CREATININE 1.12 0.98  CALCIUM 8.7 8.6   PT/INR  Recent Labs  07/02/13 1856  LABPROT 13.0  INR 1.00    Cardiac Panel (last 3 results)  Recent Labs  07/02/13 2210 07/03/13 0954 07/04/13 0421  TROPONINI >20.00* >20.00* 16.40*    Studies/Results: 2D echo- pending   Assessment/Plan  Principal Problem:   STEMI (ST elevation myocardial infarction) Active Problems:   Hyperlipidemia   Essential hypertension, benign   Peripheral vascular disease  1. STEMI/CAD: S/p aspiration thrombectomy, PCI and stenting of proximal LAD. Denies chest pain. Ambulating w/o difficulty.   -Continue DAPT with ASA + Brilinta, for a minimum of 1 year  -Continue BB, ACE and statin.  - 2D echo pending   2. PAD: with known occluded left iliac and high grade right common iliac artery stenosis which will need to be addressed on an elective basis.    3. HTN: Elevated this am at 171/73, but morning antihypertensives not yet give.  - Continue with amlodipine, lisinopril and lopressor  - will monitor response after meds are given and will adjust if needed.   4. HLD:  - continue statin.   Dispo: Possible discharge home later today if 2D echo is ok.    LOS: 3  days    Brittainy M. Ladoris Gene 07/05/2013 8:10 AM  Agree with note written by Ellen Henri  PAC. S/P AntSTEMI. Has done well. 2D nl LV fxn. DES prox LAD on DAPT. He has PAD with occluded LCIA and high grade RICA. He has lifestyle limiting claudication. OK for DC home. Will get LEA then ROV with me.   Lorretta Harp 07/05/2013 10:45 AM

## 2013-07-05 NOTE — Progress Notes (Signed)
  Echocardiogram 2D Echocardiogram has been performed.  [name] [name] 07/05/2013, 8:24 AM

## 2013-07-05 NOTE — Discharge Instructions (Addendum)
Groin Site Care Refer to this sheet in the next few weeks. These instructions provide you with information on caring for yourself after your procedure. Your caregiver may also give you more specific instructions. Your treatment has been planned according to current medical practices, but problems sometimes occur. Call your caregiver if you have any problems or questions after your procedure. HOME CARE INSTRUCTIONS  You may shower the day after the procedure.Remove the bandage (dressing) and gently wash the site with plain soap and water.Gently pat the site dry.  Do not apply powder or lotion to the site.  Do not submerge the affected site in water for 3 to 5 days.  Inspect the site at least twice daily.  Do not flex or bend the affected arm for 24 hours.  No lifting over 5 pounds (2.3 kg) for 5 days after your procedure.  Do not drive home if you are discharged the same day of the procedure. Have someone else drive you.  You may drive 24 hours after the procedure unless otherwise instructed by your caregiver.  Do not operate machinery or power tools for 24 hours.  A responsible adult should be with you for the first 24 hours after you arrive home. What to expect:  Any bruising will usually fade within 1 to 2 weeks.  Blood that collects in the tissue (hematoma) may be painful to the touch. It should usually decrease in size and tenderness within 1 to 2 weeks. SEEK IMMEDIATE MEDICAL CARE IF:  You have unusual pain at the radial site.  You have redness, warmth, swelling, or pain at the radial site.  You have drainage (other than a small amount of blood on the dressing).  You have chills.  You have a fever or persistent symptoms for more than 72 hours.  You have a fever and your symptoms suddenly get worse.  Your arm becomes pale, cool, tingly, or numb.  You have heavy bleeding from the site. Hold pressure on the site. Document Released: 04/18/2010 Document Revised:  06/08/2011 Document Reviewed: 04/18/2010 [facility] Patient Information 2014 [place], [place].

## 2013-07-13 ENCOUNTER — Telehealth (HOSPITAL_COMMUNITY): Payer: Self-pay | Admitting: *Deleted

## 2013-07-18 ENCOUNTER — Ambulatory Visit (HOSPITAL_COMMUNITY)
Admission: RE | Admit: 2013-07-18 | Discharge: 2013-07-18 | Disposition: A | Discharge status: Home / Self Care | Payer: Medicare HMO | Source: Ambulatory Visit | Attending: Cardiology | Admitting: Cardiology

## 2013-07-18 DIAGNOSIS — I739 Peripheral vascular disease, unspecified: Secondary | ICD-10-CM

## 2013-07-18 NOTE — Progress Notes (Signed)
Lower Extremity Arterial Duplex Completed. °Brianna L Mazza,RVT °

## 2013-07-21 ENCOUNTER — Encounter: Payer: Self-pay | Admitting: Cardiology

## 2013-07-21 ENCOUNTER — Ambulatory Visit (INDEPENDENT_AMBULATORY_CARE_PROVIDER_SITE_OTHER): Payer: Commercial Managed Care - HMO | Admitting: Cardiology

## 2013-07-21 ENCOUNTER — Encounter: Payer: Self-pay | Admitting: Cardiovascular Disease

## 2013-07-21 VITALS — BP 130/60 | HR 60 | Ht 68.0 in | Wt 183.3 lb

## 2013-07-21 DIAGNOSIS — R5381 Other malaise: Secondary | ICD-10-CM

## 2013-07-21 DIAGNOSIS — Z01818 Encounter for other preprocedural examination: Secondary | ICD-10-CM

## 2013-07-21 DIAGNOSIS — I739 Peripheral vascular disease, unspecified: Secondary | ICD-10-CM

## 2013-07-21 DIAGNOSIS — I714 Abdominal aortic aneurysm, without rupture, unspecified: Secondary | ICD-10-CM

## 2013-07-21 DIAGNOSIS — R5383 Other fatigue: Secondary | ICD-10-CM

## 2013-07-21 DIAGNOSIS — D689 Coagulation defect, unspecified: Secondary | ICD-10-CM

## 2013-07-21 DIAGNOSIS — Z79899 Other long term (current) drug therapy: Secondary | ICD-10-CM

## 2013-07-21 DIAGNOSIS — I2510 Atherosclerotic heart disease of native coronary artery without angina pectoris: Secondary | ICD-10-CM

## 2013-07-21 MED ORDER — ROSUVASTATIN CALCIUM 10 MG PO TABS: 10.0000 mg | ORAL_TABLET | Freq: Every day | ORAL | Status: DC

## 2013-07-21 MED ORDER — TICAGRELOR 90 MG PO TABS: 90.0000 mg | ORAL_TABLET | Freq: Two times a day (BID) | ORAL | Status: DC

## 2013-07-21 MED ORDER — LISINOPRIL 10 MG PO TABS: 10.0000 mg | ORAL_TABLET | Freq: Every day | ORAL | Status: DC

## 2013-07-21 NOTE — Patient Instructions (Addendum)
[name] has ordered a peripheral angiogram to be done at [facility].  This procedure is going to look at the bloodflow in your lower extremities.  If [name] is able to open up the arteries, you will have to spend one night in the hospital.  If he is not able to open the arteries, you will be able to go home that same day.    After the procedure, you will not be allowed to drive for 3 days or push, pull, or lift anything greater than 10 lbs for one week.    You will be required to have the following tests prior to the procedure:  1. Blood work-the blood work can be done no more than 7 days prior to the procedure.  It can be done at any [facility] lab.  There is one downstairs on the first floor of this building and one in the [place] ([address])       *REPS Randall Hiss and Sherren Mocha   We are going to schedule a ultrasound of your stomach and we will call you with the results  Have you labs done 7 days prior to your lower extremity procedure

## 2013-07-25 DIAGNOSIS — I714 Abdominal aortic aneurysm, without rupture, unspecified: Secondary | ICD-10-CM | POA: Insufficient documentation

## 2013-07-25 DIAGNOSIS — I2510 Atherosclerotic heart disease of native coronary artery without angina pectoris: Secondary | ICD-10-CM | POA: Insufficient documentation

## 2013-07-25 DIAGNOSIS — Z9861 Coronary angioplasty status: Secondary | ICD-10-CM

## 2013-07-25 NOTE — Assessment & Plan Note (Signed)
Incidental finding on recent LHC. Will further assess diameter with an abdominal ultrasound. He does have a remote history of tobacco use.

## 2013-07-25 NOTE — Progress Notes (Signed)
Patient ID: [name] [name], male   DOB: 1934/07/13, 79 y.o.   MRN: [id number]    07/25/2013 [name] [name]   29-Dec-1934  [id number]  Primary Physicia Sallee Lange, MD Primary Cardiologist: [name]  [name] [name] presents to clinic today for post hospital f/u, following an admission for STEMI, and also to discuss a plan to further evaluate his PVD.   HPI:  [name] [name] is a 79 y.o. 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 He was admitted on 07/02/13 for Anterior STEMI. Presenting symptoms included severe epigastric burning pain/ indigestion and left arm pain. Initial troponin was > 20.0. He underwent emergent LHC and successful aspiration thrombectomy, PCI and stenting of proximal LAD by [name], via the right femoral artery . The overall LVEF was estimated at 50%, without wall motion abnormalities. He tolerated the procedure well and left the cath lab in stable condition. DAPT with ASA and Brilinta was initiated. He was also placed on a BB, ACE-I and statin. His post PCI course was w/o complication.  He was discharged home on 07/05/13.   It should also be noted that at time of emergent LHC, he was found to also have significant peripheral vascular disease with an occluded left iliac and high-grade right common iliac artery stenosis, as well as a small AAA. The patient complained of bilateral LEE. [name] ordered for him to undergo bilateral LEAs. The study was performed in our office on 07/18/13. Findings are as follows:  Bilateral ABIs: Mild arterial insufficiency on the right (0.95) and moderate arterial insufficiency on the left (0.59). Right AO/CIA: 70-99% diameter reduction Left CIA: Demonstrated occlusive diease   He presents today for follow-up. He is accompanied by his wife. From a cardiac standpoint, he states that he has been doing fairly well. He  Denies recurrent chest pain. He does however note mild  intermittent SOB. He denies any respiratory distress. He denies orthopnea, PND, LEE and weight gain. He has been fully compliant with his medications, including daily ASA and Brilinta.   From a vascular standpoint, he continues to note bilateral LE claudication, L>R. He also notes occasional nocturnal resting leg pain.    Current Outpatient Prescriptions  Medication Sig Dispense Refill  . amLODipine (NORVASC) 10 MG tablet Take 1 tablet (10 mg total) by mouth daily.  90 tablet  1  . aspirin 81 MG chewable tablet Chew 1 tablet (81 mg total) by mouth daily.      [name] HYDROcodone-acetaminophen (NORCO/VICODIN) 5-325 MG per tablet Take one tablet by mouth twice daily as needed for pain  60 tablet  0  . lisinopril (PRINIVIL,ZESTRIL) 10 MG tablet Take 1 tablet (10 mg total) by mouth daily.  90 tablet  3  . methylcellulose (ARTIFICIAL TEARS) 1 % ophthalmic solution Place 1 drop into both eyes as needed (for dry eyes).      . metoprolol (LOPRESSOR) 50 MG tablet Take 50 mg by mouth 2 (two) times daily.      . nitroGLYCERIN (NITROSTAT) 0.4 MG SL tablet Place 1 tablet (0.4 mg total) under the tongue every 5 (five) minutes as needed for chest pain.  25 tablet  2  . rosuvastatin (CRESTOR) 10 MG tablet Take 1 tablet (10 mg total) by mouth daily at 6 PM.  90 tablet  3  . ticagrelor (BRILINTA) 90 MG TABS tablet Take 1 tablet (90 mg total) by mouth 2 (two) times daily.  [place]  tablet  3  . traMADol (ULTRAM) 50 MG tablet Take 1 tablet (50 mg total) by mouth every 8 (eight) hours as needed.  90 tablet  5   No current facility-administered medications for this visit.    Allergies  Allergen Reactions  . Lipitor [Atorvastatin] Other (See Comments)    Muscle and joint pain    History   Social History  . Marital Status: Married    Spouse Name: N/A    Number of Children: N/A  . Years of Education: N/A   Occupational History  . Not on file.   Social History Main Topics  . Smoking status: Former Research scientist (life sciences)  .  Smokeless tobacco: Not on file  . Alcohol Use: Not on file  . Drug Use: Not on file  . Sexual Activity: Not on file   Other Topics Concern  . Not on file   Social History Narrative  . No narrative on file     Review of Systems: General: negative for chills, fever, night sweats or weight changes.  Cardiovascular: negative for chest pain, dyspnea on exertion, edema, orthopnea, palpitations, paroxysmal nocturnal dyspnea or shortness of breath Dermatological: negative for rash Respiratory: negative for cough or wheezing Urologic: negative for hematuria Abdominal: negative for nausea, vomiting, diarrhea, bright red blood per rectum, melena, or hematemesis Neurologic: negative for visual changes, syncope, or dizziness All other systems reviewed and are otherwise negative except as noted above.    Blood pressure 130/60, pulse 60, height 5\' 8"  (1.727 m), weight 183 lb 4.8 oz (83.144 kg).  General appearance: alert, cooperative and no distress Lungs: clear to auscultation bilaterally Heart: regular rate and rhythm, S1, S2 normal, no murmur, click, rub or gallop Extremities: no LEE Pulses: 2+ and symmetric Skin: warm and dry Neurologic: Grossly normal    ASSESSMENT AND PLAN:   CAD s/p Anterior STEMI 07/02/13; s/p PCI + DES to proximal LAD. EF 50% Stable. Denies recurrent CP. Will continue DAPT with ASA + Brilinta for a minimum for 1 year for DES. Continue BB, ACE-I and statin. His EF was normal at time of cath at 50% and he has no signs of HF. I feel his dyspnea is likely due to use of Brilinta. He has been on Brilinta for ~3 weeks. Since his dyspnea is not distressing and is tolerable, I would like for him to continue for now. Pt was reassured that this is a common side effect and was told that symptoms should gradually improve, hopefully, in 1-2 weeks.    Peripheral vascular disease  He was found to also have significant peripheral vascular disease with an occluded left iliac and  high-grade right common iliac artery stenosis at time of cardiac cath. Bilateral LEAs demonstrated an ABI of 0.95 on the right and 0.59 on the left. Films were reviewed by [name] today. We will plan for a PV Angiogram with planned intervention at a later date.   AAA (abdominal aortic aneurysm) Incidental finding on recent LHC. Will further assess diameter with an abdominal ultrasound. He does have a remote history of tobacco use.     PLAN  Continue current meds for CAD. Will plan for abdominal ultrasound prior to planned PV Angio. Patient will schedule PV Angio with planned intervention, to be done by [name] at [facility].  Ilissa Rosner SimmonsPA-C 07/25/2013 9:16 AM

## 2013-07-25 NOTE — Assessment & Plan Note (Addendum)
Stable. Denies recurrent CP. Will continue DAPT with ASA + Brilinta for a minimum for 1 year for DES. Continue BB, ACE-I and statin. His EF was normal at time of cath at 50% and he has no signs of HF. I feel his dyspnea is likely due to use of Brilinta. He has been on Brilinta for ~3 weeks. Since his dyspnea is not distressing and is tolerable, I would like for him to continue for now. Pt was reassured that this is a common side effect and was told that symptoms should gradually improve, hopefully, in 1-2 weeks.

## 2013-07-25 NOTE — Assessment & Plan Note (Signed)
He was found to also have significant peripheral vascular disease with an occluded left iliac and high-grade right common iliac artery stenosis at time of cardiac cath. Bilateral LEAs demonstrated an ABI of 0.95 on the right and 0.59 on the left. Films were reviewed by [name] today. We will plan for a PV Angiogram with planned intervention at a later date.

## 2013-07-28 ENCOUNTER — Telehealth: Payer: Self-pay | Admitting: Family Medicine

## 2013-07-28 ENCOUNTER — Other Ambulatory Visit: Payer: Self-pay | Admitting: *Deleted

## 2013-07-28 ENCOUNTER — Ambulatory Visit (HOSPITAL_COMMUNITY)
Admission: RE | Admit: 2013-07-28 | Discharge: 2013-07-28 | Disposition: A | Discharge status: Home / Self Care | Payer: Medicare HMO | Source: Ambulatory Visit | Attending: Cardiovascular Disease | Admitting: Cardiovascular Disease

## 2013-07-28 DIAGNOSIS — I739 Peripheral vascular disease, unspecified: Secondary | ICD-10-CM

## 2013-07-28 DIAGNOSIS — I714 Abdominal aortic aneurysm, without rupture, unspecified: Secondary | ICD-10-CM | POA: Insufficient documentation

## 2013-07-28 NOTE — Telephone Encounter (Signed)
Apparently his electronic pharmacy had him getting prescriptions for both Plavix and Brilinta. He should not be on both. I have written a letter that the nurses will fax to the pharmacy stating that the patient should not be on Plavix. They will fax this to [facility]. One [phone]

## 2013-07-28 NOTE — Telephone Encounter (Signed)
Letter faxed to [facility]. plavix D/C on med list.

## 2013-07-28 NOTE — Progress Notes (Signed)
Abdominal aorta completed.  Oda Cogan, BS, RDMS, RVT

## 2013-08-01 ENCOUNTER — Telehealth: Payer: Self-pay | Admitting: Cardiovascular Disease

## 2013-08-01 DIAGNOSIS — I2510 Atherosclerotic heart disease of native coronary artery without angina pectoris: Secondary | ICD-10-CM

## 2013-08-01 MED ORDER — TICAGRELOR 90 MG PO TABS: 90.0000 mg | ORAL_TABLET | Freq: Two times a day (BID) | ORAL | Status: DC

## 2013-08-01 NOTE — Telephone Encounter (Signed)
Spoke to wife. RN asked if they would like 7 days or month worth of Brilinta. Wife did not no the cost but will call to find out. RN suggest some samples from the office. RN informed wife that there were 2 bottles Brilinta 90 mg (16 tablets) available to the patient.  Wife states they will come by office to pick them up

## 2013-08-01 NOTE — Telephone Encounter (Signed)
[facility] has received his RX approved and sent on 4/24 however they are unable to get to him quickly.  Only has 4 days left.  Wants to get called in locally until he can get ones through mail  Please call

## 2013-08-07 ENCOUNTER — Encounter: Payer: Self-pay | Admitting: *Deleted

## 2013-08-09 ENCOUNTER — Ambulatory Visit: Payer: Commercial Managed Care - HMO | Admitting: Cardiovascular Disease

## 2013-08-17 ENCOUNTER — Ambulatory Visit (INDEPENDENT_AMBULATORY_CARE_PROVIDER_SITE_OTHER): Payer: Commercial Managed Care - HMO | Admitting: Cardiology

## 2013-08-17 ENCOUNTER — Encounter: Payer: Self-pay | Admitting: Cardiology

## 2013-08-17 VITALS — BP 126/62 | HR 58 | Ht 68.0 in | Wt 186.1 lb

## 2013-08-17 DIAGNOSIS — I2510 Atherosclerotic heart disease of native coronary artery without angina pectoris: Secondary | ICD-10-CM

## 2013-08-17 DIAGNOSIS — R0609 Other forms of dyspnea: Secondary | ICD-10-CM

## 2013-08-17 DIAGNOSIS — R0602 Shortness of breath: Secondary | ICD-10-CM | POA: Insufficient documentation

## 2013-08-17 DIAGNOSIS — Z01818 Encounter for other preprocedural examination: Secondary | ICD-10-CM | POA: Insufficient documentation

## 2013-08-17 DIAGNOSIS — I739 Peripheral vascular disease, unspecified: Secondary | ICD-10-CM

## 2013-08-17 DIAGNOSIS — R0989 Other specified symptoms and signs involving the circulatory and respiratory systems: Secondary | ICD-10-CM

## 2013-08-17 DIAGNOSIS — R0600 Dyspnea, unspecified: Secondary | ICD-10-CM

## 2013-08-17 NOTE — Patient Instructions (Signed)
Please have blood work done on June 2nd or 3rd for your upcoming angiogram.

## 2013-08-17 NOTE — Assessment & Plan Note (Signed)
No angina 

## 2013-08-17 NOTE — Assessment & Plan Note (Signed)
He attributes this to Brilinta, he has preserved LVF by  post MI echo

## 2013-08-17 NOTE — Assessment & Plan Note (Signed)
Lt iliac, Rt Cia disease incidentally found when he had his coronary angiogram 4/15

## 2013-08-17 NOTE — Assessment & Plan Note (Signed)
Pt seen today for pre op H&P prior to scheduled PVA 09/04/13

## 2013-08-17 NOTE — Progress Notes (Signed)
  08/17/2013 [name] [name]   04/29/1934  [id number]  Primary Physicia LUKING,SCOTT, MD Primary Cardiologist: Berry  HPI:  [name] [name] is a 79 y.o. 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 He was admitted on 07/02/13 for Anterior STEMI. Presenting symptoms included severe epigastric burning pain/ indigestion and left arm pain. Initial troponin was > 20.0. He underwent emergent LHC and successful aspiration thrombectomy, PCI and stenting of proximal LAD by Dr. Berry, via the right femoral artery . The overall LVEF was estimated at 50%, without wall motion abnormalities. He tolerated the procedure well and left the cath lab in stable condition. DAPT with ASA and Brilinta was initiated. He was also placed on a BB, ACE-I and statin. His post PCI course was w/o complication. He was discharged home on 07/05/13.           It should also be noted that at time of emergent LHC, he was found to also have significant peripheral vascular disease with an occluded left iliac and high-grade right common iliac artery stenosis, as well as a small AAA. The patient complained of bilateral LEE. Dr. Berry ordered for him to undergo bilateral LEAs. The study was performed in our office on 07/18/13. Findings are as follows: ABI 0.95 on Rt, 0.59 on Lt. He is seen onw for pre-PVA H&P. He denies any chest pain. He says he has had dyspnea from the Brilinta. He admits to bilat claudication.    Current Outpatient Prescriptions  Medication Sig Dispense Refill  . amLODipine (NORVASC) 10 MG tablet Take 1 tablet (10 mg total) by mouth daily.  90 tablet  1  . aspirin 81 MG chewable tablet Chew 1 tablet (81 mg total) by mouth daily.      . HYDROcodone-acetaminophen (NORCO/VICODIN) 5-325 MG per tablet Take one tablet by mouth twice daily as needed for pain  60 tablet  0  . lisinopril (PRINIVIL,ZESTRIL) 10 MG tablet Take 1 tablet (10 mg total) by mouth daily.   90 tablet  3  . methylcellulose (ARTIFICIAL TEARS) 1 % ophthalmic solution Place 1 drop into both eyes as needed (for dry eyes).      . metoprolol (LOPRESSOR) 50 MG tablet Take 50 mg by mouth 2 (two) times daily.      . nitroGLYCERIN (NITROSTAT) 0.4 MG SL tablet Place 1 tablet (0.4 mg total) under the tongue every 5 (five) minutes as needed for chest pain.  25 tablet  2  . rosuvastatin (CRESTOR) 10 MG tablet Take 1 tablet (10 mg total) by mouth daily at 6 PM.  90 tablet  3  . ticagrelor (BRILINTA) 90 MG TABS tablet Take 1 tablet (90 mg total) by mouth 2 (two) times daily.  16 tablet  0  . traMADol (ULTRAM) 50 MG tablet Take 1 tablet (50 mg total) by mouth every 8 (eight) hours as needed.  90 tablet  5   No current facility-administered medications for this visit.    Allergies  Allergen Reactions  . Lipitor [Atorvastatin] Other (See Comments)    Muscle and joint pain    History   Social History  . Marital Status: Married    Spouse Name: N/A    Number of Children: N/A  . Years of Education: N/A   Occupational History  . Not on file.   Social History Main Topics  . Smoking status: Former Smoker  . Smokeless tobacco: Not on file  .   Alcohol Use: Not on file  . Drug Use: Not on file  . Sexual Activity: Not on file   Other Topics Concern  . Not on file   Social History Narrative  . No narrative on file     Review of Systems: General: negative for chills, fever, night sweats or weight changes.  Cardiovascular: negative for chest pain, dyspnea on exertion, edema, orthopnea, palpitations, paroxysmal nocturnal dyspnea or shortness of breath Dermatological: negative for rash Respiratory: negative for cough or wheezing Urologic: negative for hematuria Abdominal: negative for nausea, vomiting, diarrhea, bright red blood per rectum, melena, or hematemesis Neurologic: negative for visual changes, syncope, or dizziness All other systems reviewed and are otherwise negative except as  noted above.    Blood pressure 126/62, pulse 58, height 5' 8" (1.727 m), weight 186 lb 1.6 oz (84.414 kg).  General appearance: alert, cooperative and no distress Lungs: scattered rhonchi Heart: regular rate and rhythm Extremities: no edema, diminished pulses bilat. No FA bruits noted Abd: soft, non tender, no bruit Neuro: Grossly intact.   EKG NSR, SB, poor ant RW  ASSESSMENT AND PLAN:   Pre-op exam Pt seen today for pre op H&P prior to scheduled PVA 09/04/13  Peripheral vascular disease Lt iliac, Rt Cia disease incidentally found when he had his coronary angiogram 4/15  CAD s/p Anterior STEMI 07/02/13; s/p PCI + DES to proximal LAD. EF 50% No angina  Dyspnea He attributes this to Brilinta, he has preserved LVF by  post MI echo   PLAN  PVA 09/04/13. Consider changing to Plavix after his PVA.   Fariha Goto K KilroyPA-C 08/17/2013 10:05 AM 

## 2013-08-22 ENCOUNTER — Encounter (HOSPITAL_COMMUNITY): Payer: Self-pay | Admitting: Pharmacy Technician

## 2013-08-22 ENCOUNTER — Other Ambulatory Visit: Payer: Self-pay | Admitting: *Deleted

## 2013-08-22 DIAGNOSIS — Z01818 Encounter for other preprocedural examination: Secondary | ICD-10-CM

## 2013-08-30 LAB — CBC WITH DIFFERENTIAL/PLATELET
Basophils Absolute: 0.1 10*3/uL (ref 0.0–0.1)
Basophils Relative: 1 % (ref 0–1)
EOS ABS: 0.7 10*3/uL (ref 0.0–0.7)
Eosinophils Relative: 7 % — ABNORMAL HIGH (ref 0–5)
HEMATOCRIT: 42.7 % (ref 39.0–52.0)
HEMOGLOBIN: 14.3 g/dL (ref 13.0–17.0)
Lymphocytes Relative: 16 % (ref 12–46)
Lymphs Abs: 1.6 10*3/uL (ref 0.7–4.0)
MCH: 28.1 pg (ref 26.0–34.0)
MCHC: 33.5 g/dL (ref 30.0–36.0)
MCV: 83.9 fL (ref 78.0–100.0)
MONO ABS: 0.9 10*3/uL (ref 0.1–1.0)
MONOS PCT: 9 % (ref 3–12)
NEUTROS PCT: 67 % (ref 43–77)
Neutro Abs: 6.6 10*3/uL (ref 1.7–7.7)
Platelets: 143 10*3/uL — ABNORMAL LOW (ref 150–400)
RBC: 5.09 MIL/uL (ref 4.22–5.81)
RDW: 16 % — ABNORMAL HIGH (ref 11.5–15.5)
WBC: 9.9 10*3/uL (ref 4.0–10.5)

## 2013-08-30 LAB — COMPREHENSIVE METABOLIC PANEL
ALBUMIN: 4.1 g/dL (ref 3.5–5.2)
ALT: 10 U/L (ref 0–53)
AST: 14 U/L (ref 0–37)
Alkaline Phosphatase: 62 U/L (ref 39–117)
BILIRUBIN TOTAL: 0.5 mg/dL (ref 0.2–1.2)
BUN: 15 mg/dL (ref 6–23)
CO2: 22 meq/L (ref 19–32)
Calcium: 9.1 mg/dL (ref 8.4–10.5)
Chloride: 102 mEq/L (ref 96–112)
Creat: 0.8 mg/dL (ref 0.50–1.35)
GLUCOSE: 101 mg/dL — AB (ref 70–99)
Potassium: 4.4 mEq/L (ref 3.5–5.3)
SODIUM: 136 meq/L (ref 135–145)
Total Protein: 6.1 g/dL (ref 6.0–8.3)

## 2013-08-30 LAB — URINALYSIS, MICROSCOPIC ONLY
BACTERIA UA: NONE SEEN
CRYSTALS: NONE SEEN
Casts: NONE SEEN
Squamous Epithelial / LPF: NONE SEEN

## 2013-08-30 LAB — PROTIME-INR
INR: 0.97 (ref <1.50)
PROTHROMBIN TIME: 12.8 s (ref 11.6–15.2)

## 2013-08-30 LAB — TSH: TSH: 1.737 u[IU]/mL (ref 0.350–4.500)

## 2013-08-30 LAB — APTT: aPTT: 28 seconds (ref 24–37)

## 2013-09-01 ENCOUNTER — Other Ambulatory Visit: Payer: Self-pay | Admitting: *Deleted

## 2013-09-01 MED ORDER — TRAMADOL HCL 50 MG PO TABS: 50.0000 mg | ORAL_TABLET | Freq: Three times a day (TID) | ORAL | Status: DC

## 2013-09-04 ENCOUNTER — Encounter (HOSPITAL_COMMUNITY): Payer: Self-pay | Admitting: General Practice

## 2013-09-04 ENCOUNTER — Encounter (HOSPITAL_COMMUNITY)
Admission: RE | Disposition: A | Discharge status: Home / Self Care | Payer: Self-pay | Source: Ambulatory Visit | Attending: Cardiovascular Disease

## 2013-09-04 ENCOUNTER — Ambulatory Visit (HOSPITAL_COMMUNITY)
Admission: RE | Admit: 2013-09-04 | Discharge: 2013-09-05 | Disposition: A | Discharge status: Home / Self Care | Payer: Medicare HMO | Source: Ambulatory Visit | Attending: Cardiovascular Disease | Admitting: Cardiovascular Disease

## 2013-09-04 DIAGNOSIS — Z9861 Coronary angioplasty status: Secondary | ICD-10-CM | POA: Insufficient documentation

## 2013-09-04 DIAGNOSIS — I70219 Atherosclerosis of native arteries of extremities with intermittent claudication, unspecified extremity: Secondary | ICD-10-CM

## 2013-09-04 DIAGNOSIS — I10 Essential (primary) hypertension: Secondary | ICD-10-CM | POA: Diagnosis present

## 2013-09-04 DIAGNOSIS — I708 Atherosclerosis of other arteries: Secondary | ICD-10-CM | POA: Insufficient documentation

## 2013-09-04 DIAGNOSIS — I252 Old myocardial infarction: Secondary | ICD-10-CM | POA: Insufficient documentation

## 2013-09-04 DIAGNOSIS — I714 Abdominal aortic aneurysm, without rupture, unspecified: Secondary | ICD-10-CM | POA: Insufficient documentation

## 2013-09-04 DIAGNOSIS — Z87891 Personal history of nicotine dependence: Secondary | ICD-10-CM | POA: Insufficient documentation

## 2013-09-04 DIAGNOSIS — I2510 Atherosclerotic heart disease of native coronary artery without angina pectoris: Secondary | ICD-10-CM | POA: Insufficient documentation

## 2013-09-04 DIAGNOSIS — Z01818 Encounter for other preprocedural examination: Secondary | ICD-10-CM

## 2013-09-04 DIAGNOSIS — I739 Peripheral vascular disease, unspecified: Secondary | ICD-10-CM | POA: Diagnosis present

## 2013-09-04 DIAGNOSIS — E785 Hyperlipidemia, unspecified: Secondary | ICD-10-CM

## 2013-09-04 DIAGNOSIS — Z7982 Long term (current) use of aspirin: Secondary | ICD-10-CM | POA: Insufficient documentation

## 2013-09-04 DIAGNOSIS — D696 Thrombocytopenia, unspecified: Secondary | ICD-10-CM | POA: Insufficient documentation

## 2013-09-04 DIAGNOSIS — Z7902 Long term (current) use of antithrombotics/antiplatelets: Secondary | ICD-10-CM | POA: Insufficient documentation

## 2013-09-04 HISTORY — PX: ILIAC ARTERY STENT: SHX1786

## 2013-09-04 HISTORY — DX: Peripheral vascular disease, unspecified: I73.9

## 2013-09-04 HISTORY — DX: Unspecified osteoarthritis, unspecified site: M19.90

## 2013-09-04 HISTORY — DX: Thrombocytopenia, unspecified: D69.6

## 2013-09-04 LAB — POCT ACTIVATED CLOTTING TIME
ACTIVATED CLOTTING TIME: 210 s
Activated Clotting Time: 188 seconds
Activated Clotting Time: 171 seconds

## 2013-09-04 SURGERY — ABDOMINAL ANGIOGRAM
Laterality: Right

## 2013-09-04 MED ORDER — LIDOCAINE HCL (PF) 1 % IJ SOLN
INTRAMUSCULAR | Status: AC
  Filled 2013-09-04: qty 30

## 2013-09-04 MED ORDER — VERAPAMIL HCL 2.5 MG/ML IV SOLN
INTRAVENOUS | Status: AC
  Filled 2013-09-04: qty 2

## 2013-09-04 MED ORDER — ASPIRIN EC 325 MG PO TBEC: 325.0000 mg | DELAYED_RELEASE_TABLET | Freq: Every day | ORAL | Status: DC

## 2013-09-04 MED ORDER — SODIUM CHLORIDE 0.9 % IV SOLN
INTRAVENOUS | Status: DC
  Administered 2013-09-04: 08:00:00 via INTRAVENOUS

## 2013-09-04 MED ORDER — HEPARIN (PORCINE) IN NACL 2-0.9 UNIT/ML-% IJ SOLN
INTRAMUSCULAR | Status: AC
  Filled 2013-09-04: qty 1000

## 2013-09-04 MED ORDER — HYDRALAZINE HCL 20 MG/ML IJ SOLN
INTRAMUSCULAR | Status: AC
  Filled 2013-09-04: qty 1

## 2013-09-04 MED ORDER — SODIUM CHLORIDE 0.9 % IV SOLN: INTRAVENOUS | Status: AC

## 2013-09-04 MED ORDER — MIDAZOLAM HCL 2 MG/2ML IJ SOLN
INTRAMUSCULAR | Status: AC
  Filled 2013-09-04: qty 2

## 2013-09-04 MED ORDER — ASPIRIN 81 MG PO CHEW
81.0000 mg | CHEWABLE_TABLET | Freq: Every day | ORAL | Status: DC
  Administered 2013-09-05: 09:00:00 81 mg via ORAL
  Filled 2013-09-04: qty 1

## 2013-09-04 MED ORDER — TRAMADOL HCL 50 MG PO TABS
50.0000 mg | ORAL_TABLET | Freq: Three times a day (TID) | ORAL | Status: DC
  Administered 2013-09-04 – 2013-09-05 (×3): 50 mg via ORAL
  Filled 2013-09-04 (×3): qty 1

## 2013-09-04 MED ORDER — HYDRALAZINE HCL 20 MG/ML IJ SOLN: 10.0000 mg | INTRAMUSCULAR | Status: DC | PRN

## 2013-09-04 MED ORDER — NITROGLYCERIN 0.4 MG SL SUBL: 0.4000 mg | SUBLINGUAL_TABLET | SUBLINGUAL | Status: DC | PRN

## 2013-09-04 MED ORDER — SODIUM CHLORIDE 0.9 % IJ SOLN: 3.0000 mL | INTRAMUSCULAR | Status: DC | PRN

## 2013-09-04 MED ORDER — METOPROLOL TARTRATE 50 MG PO TABS
50.0000 mg | ORAL_TABLET | Freq: Two times a day (BID) | ORAL | Status: DC
  Administered 2013-09-04 – 2013-09-05 (×2): 50 mg via ORAL
  Filled 2013-09-04 (×3): qty 1

## 2013-09-04 MED ORDER — NITROGLYCERIN IN D5W 200-5 MCG/ML-% IV SOLN
INTRAVENOUS | Status: AC
  Filled 2013-09-04: qty 250

## 2013-09-04 MED ORDER — LISINOPRIL 10 MG PO TABS
10.0000 mg | ORAL_TABLET | Freq: Every day | ORAL | Status: DC
  Administered 2013-09-05: 10 mg via ORAL
  Filled 2013-09-04: qty 1

## 2013-09-04 MED ORDER — AMLODIPINE BESYLATE 10 MG PO TABS
10.0000 mg | ORAL_TABLET | Freq: Every day | ORAL | Status: DC
  Administered 2013-09-05: 10 mg via ORAL
  Filled 2013-09-04: qty 1

## 2013-09-04 MED ORDER — HYDROCODONE-ACETAMINOPHEN 5-325 MG PO TABS
1.0000 | ORAL_TABLET | ORAL | Status: DC | PRN
  Administered 2013-09-04: 15:00:00 1 via ORAL
  Filled 2013-09-04: qty 1

## 2013-09-04 MED ORDER — ASPIRIN 81 MG PO CHEW: 81.0000 mg | CHEWABLE_TABLET | ORAL | Status: DC

## 2013-09-04 MED ORDER — TICAGRELOR 90 MG PO TABS
90.0000 mg | ORAL_TABLET | Freq: Two times a day (BID) | ORAL | Status: DC
  Administered 2013-09-04 – 2013-09-05 (×2): 90 mg via ORAL
  Filled 2013-09-04 (×3): qty 1

## 2013-09-04 MED ORDER — HEPARIN SODIUM (PORCINE) 1000 UNIT/ML IJ SOLN
INTRAMUSCULAR | Status: AC
  Filled 2013-09-04: qty 1

## 2013-09-04 MED ORDER — FENTANYL CITRATE 0.05 MG/ML IJ SOLN
INTRAMUSCULAR | Status: AC
  Filled 2013-09-04: qty 2

## 2013-09-04 NOTE — H&P (View-Only) (Signed)
08/17/2013 [name]   1934/07/13  [id number]  Primary Millhousen, MD Primary Cardiologist: Gwenlyn Found  HPI:  [name] [name] is a 79 y.o. 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 He was admitted on 07/02/13 for Anterior STEMI. Presenting symptoms included severe epigastric burning pain/ indigestion and left arm pain. Initial troponin was > 20.0. He underwent emergent LHC and successful aspiration thrombectomy, PCI and stenting of proximal LAD by [name], via the right femoral artery . The overall LVEF was estimated at 50%, without wall motion abnormalities. He tolerated the procedure well and left the cath lab in stable condition. DAPT with ASA and Brilinta was initiated. He was also placed on a BB, ACE-I and statin. His post PCI course was w/o complication. He was discharged home on 07/05/13.           It should also be noted that at time of emergent LHC, he was found to also have significant peripheral vascular disease with an occluded left iliac and high-grade right common iliac artery stenosis, as well as a small AAA. The patient complained of bilateral LEE. [name] ordered for him to undergo bilateral LEAs. The study was performed in our office on 07/18/13. Findings are as follows: ABI 0.95 on Rt, 0.59 on Lt. He is seen onw for pre-PVA H&P. He denies any chest pain. He says he has had dyspnea from the [place]. He admits to bilat claudication.    Current Outpatient Prescriptions  Medication Sig Dispense Refill  . amLODipine (NORVASC) 10 MG tablet Take 1 tablet (10 mg total) by mouth daily.  90 tablet  1  . aspirin 81 MG chewable tablet Chew 1 tablet (81 mg total) by mouth daily.      [name] HYDROcodone-acetaminophen (NORCO/VICODIN) 5-325 MG per tablet Take one tablet by mouth twice daily as needed for pain  60 tablet  0  . lisinopril (PRINIVIL,ZESTRIL) 10 MG tablet Take 1 tablet (10 mg total) by mouth daily.   90 tablet  3  . methylcellulose (ARTIFICIAL TEARS) 1 % ophthalmic solution Place 1 drop into both eyes as needed (for dry eyes).      . metoprolol (LOPRESSOR) 50 MG tablet Take 50 mg by mouth 2 (two) times daily.      . nitroGLYCERIN (NITROSTAT) 0.4 MG SL tablet Place 1 tablet (0.4 mg total) under the tongue every 5 (five) minutes as needed for chest pain.  25 tablet  2  . rosuvastatin (CRESTOR) 10 MG tablet Take 1 tablet (10 mg total) by mouth daily at 6 PM.  90 tablet  3  . ticagrelor (BRILINTA) 90 MG TABS tablet Take 1 tablet (90 mg total) by mouth 2 (two) times daily.  16 tablet  0  . traMADol (ULTRAM) 50 MG tablet Take 1 tablet (50 mg total) by mouth every 8 (eight) hours as needed.  90 tablet  5   No current facility-administered medications for this visit.    Allergies  Allergen Reactions  . Lipitor [Atorvastatin] Other (See Comments)    Muscle and joint pain    History   Social History  . Marital Status: Married    Spouse Name: N/A    Number of Children: N/A  . Years of Education: N/A   Occupational History  . Not on file.   Social History Main Topics  . Smoking status: Former Research scientist (life sciences)  . Smokeless tobacco: Not on file  .  Alcohol Use: Not on file  . Drug Use: Not on file  . Sexual Activity: Not on file   Other Topics Concern  . Not on file   Social History Narrative  . No narrative on file     Review of Systems: General: negative for chills, fever, night sweats or weight changes.  Cardiovascular: negative for chest pain, dyspnea on exertion, edema, orthopnea, palpitations, paroxysmal nocturnal dyspnea or shortness of breath Dermatological: negative for rash Respiratory: negative for cough or wheezing Urologic: negative for hematuria Abdominal: negative for nausea, vomiting, diarrhea, bright red blood per rectum, melena, or hematemesis Neurologic: negative for visual changes, syncope, or dizziness All other systems reviewed and are otherwise negative except as  noted above.    Blood pressure 126/62, pulse 58, height 5\' 8"  (1.727 m), weight 186 lb 1.6 oz (84.414 kg).  General appearance: alert, cooperative and no distress Lungs: scattered rhonchi Heart: regular rate and rhythm Extremities: no edema, diminished pulses bilat. No FA bruits noted Abd: soft, non tender, no bruit Neuro: Grossly intact.   EKG NSR, SB, poor ant RW  ASSESSMENT AND PLAN:   Pre-op exam Pt seen today for pre op H&P prior to scheduled PVA 09/04/13  Peripheral vascular disease Lt iliac, Rt Cia disease incidentally found when he had his coronary angiogram 4/15  CAD s/p Anterior STEMI 07/02/13; s/p PCI + DES to proximal LAD. EF 50% No angina  Dyspnea He attributes this to Brilinta, he has preserved LVF by  post MI echo   PLAN  PVA 09/04/13. Consider changing to Plavix after his PVA.   Doreene Burke [facility] 08/17/2013 10:05 AM

## 2013-09-04 NOTE — Interval H&P Note (Signed)
History and Physical Interval Note:  09/04/2013 10:16 AM  [name] [name]  has presented today for surgery, with the diagnosis of cad  The various methods of treatment have been discussed with the patient and family. After consideration of risks, benefits and other options for treatment, the patient has consented to  Procedure(s): LOWER EXTREMITY ANGIOGRAM (N/A) as a surgical intervention .  The patient's history has been reviewed, patient examined, no change in status, stable for surgery.  I have reviewed the patient's chart and labs.  Questions were answered to the patient's satisfaction.     Lorretta Harp

## 2013-09-04 NOTE — Care Management Note (Addendum)
  Page 1 of 1   09/04/2013     4:20:36 PM CARE MANAGEMENT NOTE 09/04/2013  Patient:  [name] [name]   Account Number:  [id number]  Date Initiated:  09/04/2013  Documentation initiated by:  Mariann Laster  Subjective/Objective Assessment:   Admitted with CAD     Action/Plan:   CM to follow for disposition needs   Anticipated DC Date:  09/05/2013   Anticipated DC Plan:  HOME/SELF CARE         Choice offered to / List presented to:             Status of service:  Completed, signed off Medicare Important Message given?   (If response is "NO", the following Medicare IM given date fields will be blank) Date Medicare IM given:   Date Additional Medicare IM given:    Discharge Disposition:  HOME/SELF CARE  Per UR Regulation:  Reviewed for med. necessity/level of care/duration of stay  If discussed at [place] of Stay Meetings, dates discussed:    Comments:  [name] Prestage RN, BSN, MSHL, CCM  Nurse - Case Manager, (Unit [phone]  09/04/2013 Med Review:  ticagrelor (BRILINTA) tablet 90 mg  :  Dose 90 mg  :  Oral  :  2 times daily Hx/o active on Brilinta prior to this admission

## 2013-09-04 NOTE — CV Procedure (Signed)
HENCE DERRICK is a 79 y.o. male    [id number] LOCATION:  FACILITY: [facility]  PHYSICIAN: Quay Burow, M.D. [birth year]   DATE OF PROCEDURE:  09/04/2013  DATE OF DISCHARGE:     PV Angiogram/Intervention    History obtained from chart review.LUKIS BUNT is a 79 y.o. 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 He was admitted on 07/02/13 for Anterior STEMI. Presenting symptoms included severe epigastric burning pain/ indigestion and left arm pain. Initial troponin was > 20.0. He underwent emergent LHC and successful aspiration thrombectomy, PCI and stenting of proximal LAD by [name], via the right femoral artery . The overall LVEF was estimated at 50%, without wall motion abnormalities. He tolerated the procedure well and left the cath lab in stable condition. DAPT with ASA and Brilinta was initiated. He was also placed on a BB, ACE-I and statin. His post PCI course was w/o complication. He was discharged home on 07/05/13.  It should also be noted that at time of emergent LHC, he was found to also have significant peripheral vascular disease with an occluded left iliac and high-grade right common iliac artery stenosis, as well as a small AAA. The patient complained of bilateral LEE. [name] ordered for him to undergo bilateral LEAs. The study was performed in our office on 07/18/13. Findings are as follows: ABI 0.95 on Rt, 0.59 on Lt. He presents today for percutaneous recatheterization of his right common iliac artery using diamondback orbital rotational atherectomy, PTA and stenting using ICast  Covered stent.    PROCEDURE DESCRIPTION:   The patient was brought to the second floor [facility] cath lab in the postabsorptive state. He was  premedicated with Valium 5 mg by mouth, IV Versed and fentanyl. His right groinwas prepped and shaved in usual sterile fashion. Xylocaine 1% was used for local anesthesia. A 7  French sheath was inserted into the right common femoral  artery using standard Seldinger technique.a 5 French pigtail catheter was placed in the distal abdominal aorta. Distal abdominal aortography and bilateral iliac angiography were performed. Visipaque dye was used for the entirety of the case. Retrograde aortic pressure was monitored during the case.   HEMODYNAMICS:    AO SYSTOLIC/AO DIASTOLIC: 332/95   Angiographic Data:   1: Abdominal aortogram-there was a small infrarenal abdominal aortic aneurysm terminating just above the iliac bifurcation.  2: Left lower extremity-the left common iliac artery was occluded at its origin. It was fluoroscopically calcified. The reconstituted by most likely lumbar collaterals in the distal common iliac just above the take off of the hypogastric artery.  3: Right lower extremity-there was a 90% calcified ostial right common iliac artery stenosis. There was a 40 mm pullback gradient noted using a 5 French antral catheter after administration of 200 g of intra-arterial nitroglycerin through the SideArm sheath.  IMPRESSION:high-grade physiologically significant calcified ostial right common iliac artery stenosis with an occluded left common iliac artery. Will proceed with diamondback orbital rotational  atherectomy  followed by PTA and stenting using an ICast  covered stent.  Procedure Description:the short sheath was exchanged over a 0.35 wire for a 7 French Bright tip sheath. The patient received a total of 7000 units of heparin with a peak ACT of 210 and ending ACT of 188. Total contrast administered to the patient with 205 cc. I placed a 0.14 cm viper wire across the lesion in the distal abdominal aorta. Following this  I performed orbital were 2/colectomy with a 2 mm solid burr across the calcified stenosis up to 120,000 rpm's. Angiography were revealed a significant amount of "debulking". Following this I stented using at an 8 mm x 30 mm long ICast covered  stent at nominal pressures and post dilated with a 9 mm x 2 cm balloon distal to the origin resulting in reduction of a 90% calcified ostial right common iliac artery stenosis to 0% residual.  Final Impression: successful diamondback orbital rotational atherectomy, PT and stenting using a covered stent of a calcified ostial right common iliac artery stenosis. I did identify the total left common iliac artery occlusion and a segment of reconstitution. It may be possible to percutaneously cross this in the future. The sheath will be removed and pressure will be held on the groin to achieve hemostasis. The patient is already on dual antibiotic therapy because of his recent STEMI. He'll be hydrated overnight, discharged home in the morning. 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    Lorretta Harp. MD, [facility] 09/04/2013 11:36 AM

## 2013-09-05 ENCOUNTER — Other Ambulatory Visit: Payer: Self-pay | Admitting: Physician Assistant

## 2013-09-05 ENCOUNTER — Encounter (HOSPITAL_COMMUNITY): Payer: Self-pay | Admitting: Physician Assistant

## 2013-09-05 DIAGNOSIS — I10 Essential (primary) hypertension: Secondary | ICD-10-CM

## 2013-09-05 DIAGNOSIS — I2510 Atherosclerotic heart disease of native coronary artery without angina pectoris: Secondary | ICD-10-CM

## 2013-09-05 DIAGNOSIS — I739 Peripheral vascular disease, unspecified: Secondary | ICD-10-CM

## 2013-09-05 DIAGNOSIS — E785 Hyperlipidemia, unspecified: Secondary | ICD-10-CM

## 2013-09-05 LAB — BASIC METABOLIC PANEL
BUN: 15 mg/dL (ref 6–23)
CO2: 25 mEq/L (ref 19–32)
CREATININE: 1 mg/dL (ref 0.50–1.35)
Calcium: 8.9 mg/dL (ref 8.4–10.5)
Chloride: 100 mEq/L (ref 96–112)
GFR calc Af Amer: 82 mL/min — ABNORMAL LOW (ref >90)
GFR, EST NON AFRICAN AMERICAN: 71 mL/min — AB (ref >90)
Glucose, Bld: 98 mg/dL (ref 70–99)
POTASSIUM: 4.2 meq/L (ref 3.7–5.3)
Sodium: 138 mEq/L (ref 137–147)

## 2013-09-05 LAB — CBC
HCT: 40.2 % (ref 39.0–52.0)
Hemoglobin: 13.1 g/dL (ref 13.0–17.0)
MCH: 28.2 pg (ref 26.0–34.0)
MCHC: 32.6 g/dL (ref 30.0–36.0)
MCV: 86.5 fL (ref 78.0–100.0)
Platelets: 112 10*3/uL — ABNORMAL LOW (ref 150–400)
RBC: 4.65 MIL/uL (ref 4.22–5.81)
RDW: 15.1 % (ref 11.5–15.5)
WBC: 9.8 10*3/uL (ref 4.0–10.5)

## 2013-09-05 NOTE — Discharge Summary (Signed)
CARDIOLOGY DISCHARGE SUMMARY   Patient ID: [name] [name] MRN: [id number] DOB/AGE: Feb 07, 1935 79 y.o.  Admit date: 09/04/2013 Discharge date: 09/05/2013  PCP: Sallee Lange, MD Primary Cardiologist: [name]  Primary Discharge Diagnosis:  Claudication - s/p diamondback orbital rotational atherectomy, PTA and stenting w/ 8 mm x 30 mm long ICast covered stent to a calcified ostial right common iliac artery  Secondary Discharge Diagnosis:    Essential hypertension, benign   Thrombocytopenia  Procedures: Abdominal aortogram, lower extremity angiogram, diamondback orbital rotational atherectomy, PTA and stenting of the right CIA  Hospital Course: [name] [name] is a 79 y.o. male with a history of CAD and PVD. At the time of his cardiac catheterization for a STEMI, he was noted to have significant PVD. He had abnormal ABIs and was scheduled for PV catheterization. He came to the hospital for the procedure on 06/08.  Procedure results are below. He had rotational atherectomy and stenting to the right common iliac artery. He tolerated the procedure well.  On 06/09, he was seen by Dr. Martinique in all data were reviewed. He was having no chest pain or shortness of breath. He had a small hematoma with some ecchymosis but was not felt to have any ongoing bleeding. He was noted to have mild thrombocytopenia but has no bleeding issues and his platelet count is in line with previous values. His blood pressure was somewhat elevated in the office but the patient stated this was because he was off his scheduled medications. He was noted to have had good blood pressure control in the office. No medication changes are indicated at this time.  He is to continue on dual antiplatelet therapy with aspirin and Brilinta as well as his other medications. He is considered stable for discharge, to follow up as an outpatient.  Labs:   Lab Results  Component Value Date   WBC 9.8 09/05/2013   HGB 13.1  09/05/2013   HCT 40.2 09/05/2013   MCV 86.5 09/05/2013   PLT 112* 09/05/2013     Recent Labs Lab 09/05/13 0318  NA 138  K 4.2  CL 100  CO2 25  BUN 15  CREATININE 1.00  CALCIUM 8.9  GLUCOSE 98    Peripheral Cath: 09/04/2013 Angiographic Data:  1: Abdominal aortogram-there was a small infrarenal abdominal aortic aneurysm terminating just above the iliac bifurcation.  2: Left lower extremity-the left common iliac artery was occluded at its origin. It was fluoroscopically calcified. The reconstituted by most likely lumbar collaterals in the distal common iliac just above the take off of the hypogastric artery.  3: Right lower extremity-there was a 90% calcified ostial right common iliac artery stenosis. There was a 40 mm pullback gradient noted using a 5 French antral catheter after administration of 200 g of intra-arterial nitroglycerin through the SideArm sheath.  IMPRESSION:high-grade physiologically significant calcified ostial right common iliac artery stenosis with an occluded left common iliac artery. Will proceed with diamondback orbital rotational atherectomy followed by PTA and stenting using an ICast covered stent.  Procedure Description:the short sheath was exchanged over a 0.35 wire for a 7 French Bright tip sheath. The patient received a total of 7000 units of heparin with a peak ACT of 210 and ending ACT of 188. Total contrast administered to the patient with 205 cc. I placed a 0.14 cm viper wire across the lesion in the distal abdominal aorta. Following this I performed orbital were 2/colectomy with a 2 mm solid burr across the calcified  stenosis up to 120,000 rpm's. Angiography were revealed a significant amount of "debulking". Following this I stented using at an 8 mm x 30 mm long ICast covered stent at nominal pressures and post dilated with a 9 mm x 2 cm balloon distal to the origin resulting in reduction of a 90% calcified ostial right common iliac artery stenosis to 0% residual.    Final Impression: successful diamondback orbital rotational atherectomy, PT and stenting using a covered stent of a calcified ostial right common iliac artery stenosis. I did identify the total left common iliac artery occlusion and a segment of reconstitution. It may be possible to percutaneously cross this in the future. The sheath will be removed and pressure will be held on the groin to achieve hemostasis. The patient is already on dual antibiotic therapy because of his recent STEMI. He'll be hydrated overnight, discharged home in the morning. 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    FOLLOW UP PLANS AND APPOINTMENTS Allergies  Allergen Reactions  . Lipitor [Atorvastatin] Other (See Comments)    Muscle and joint pain     Medication List         amLODipine 10 MG tablet  Commonly known as:  NORVASC  Take 1 tablet (10 mg total) by mouth daily.     aspirin 81 MG chewable tablet  Chew 1 tablet (81 mg total) by mouth daily.     HYDROcodone-acetaminophen 5-325 MG per tablet  Commonly known as:  NORCO/VICODIN  Take one tablet by mouth twice daily as needed for pain     lisinopril 10 MG tablet  Commonly known as:  PRINIVIL,ZESTRIL  Take 1 tablet (10 mg total) by mouth daily.     methylcellulose 1 % ophthalmic solution  Commonly known as:  ARTIFICIAL TEARS  Place 1 drop into both eyes as needed (for dry eyes).     metoprolol 50 MG tablet  Commonly known as:  LOPRESSOR  Take 50 mg by mouth 2 (two) times daily.     nitroGLYCERIN 0.4 MG SL tablet  Commonly known as:  NITROSTAT  Place 1 tablet (0.4 mg total) under the tongue every 5 (five) minutes as needed for chest pain.     rosuvastatin 10 MG tablet  Commonly known as:  CRESTOR  Take 1 tablet (10 mg total) by mouth daily at 6 PM.     ticagrelor 90 MG Tabs tablet  Commonly  known as:  BRILINTA  Take 1 tablet (90 mg total) by mouth 2 (two) times daily.     traMADol 50 MG tablet  Commonly known as:  ULTRAM  Take 1 tablet (50 mg total) by mouth 3 (three) times daily.        Discharge Instructions   Diet - low sodium heart healthy    Complete by:  As directed      Increase activity slowly    Complete by:  As directed           Follow-up Information   Follow up with [facility] On 09/19/2013. (Dopplers/ABIs at 11:00 AM)    Contact information:   [address] [place] [address] [phone]      Follow up with Lorretta Harp, MD On 09/26/2013. (At 3:15 PM)    Specialty:  Cardiology   Contact information:   [address]  [place] [address] [phone]       BRING ALL MEDICATIONS WITH YOU TO FOLLOW UP APPOINTMENTS  Time spent with patient to include physician time: 39 min Signed: Lonn [name], PA-C 09/05/2013, 10:34 AM Co-Sign MD

## 2013-09-05 NOTE — Progress Notes (Signed)
     Patient Name: [name] [name] Date of Encounter: 09/05/2013  Principal Problem:   Claudication Active Problems:   Essential hypertension, benign    Patient Profile: 79 yo male w/ hx CAD, STEMI 06/2013 w/ PCI LAD, HTN, HLD was admitted 06/08 for PV cath. Had PCI right CIA.  SUBJECTIVE: No chest pain or SOB. Right leg is fine, left is OK.  OBJECTIVE Filed Vitals:   09/04/13 2020 09/05/13 0009 09/05/13 0557 09/05/13 0822  BP: 144/61 155/55 167/69 180/69  Pulse: 72 67 70 84  Temp: 99.2 F (37.3 C) 98.5 F (36.9 C) 98.9 F (37.2 C) 98.4 F (36.9 C)  TempSrc: Oral Oral Oral Oral  Resp: 18 18 20 18   Height:      Weight:  184 lb 11.9 oz (83.8 kg)    SpO2: 94% 96% 96% 96%    Intake/Output Summary (Last 24 hours) at 09/05/13 0826 Last data filed at 09/05/13 0558  Gross per 24 hour  Intake    775 ml  Output   1025 ml  Net   -250 ml   Filed Weights   09/04/13 0805 09/05/13 0009  Weight: 183 lb (83.008 kg) 184 lb 11.9 oz (83.8 kg)    PHYSICAL EXAM General: Well developed, well nourished, male in no acute distress. Head: Normocephalic, atraumatic.  Neck: Supple without bruits, JVD not elevated Lungs:  Resp regular and unlabored, CTA. Heart: RRR, S1, S2, no S3, S4, soft murmur; no rub. Abdomen: Soft, non-tender, non-distended, BS + x 4.  Extremities: No clubbing, cyanosis, no edema. Ecchymosis right groin, no hematoma. Bruit noted but feel this is old. Decreased pulses LLE. Neuro: Alert and oriented X 3. Moves all extremities spontaneously. Psych: Normal affect.  LABS: CBC:  Recent Labs  09/05/13 0318  WBC 9.8  HGB 13.1  HCT 40.2  MCV 86.5  PLT 112*    Basic Metabolic Panel:  Recent Labs  09/05/13 0318  NA 138  K 4.2  CL 100  CO2 25  GLUCOSE 98  BUN 15  CREATININE 1.00  CALCIUM 8.9    TELE:  SR, sinus brady      Current Medications:  . amLODipine  10 mg Oral Daily  . aspirin  81 mg Oral Daily  . lisinopril  10 mg Oral Daily  .  metoprolol  50 mg Oral BID  . ticagrelor  90 mg Oral BID  . traMADol  50 mg Oral TID      ASSESSMENT AND PLAN: Active Problems:   Claudication - successful diamondback orbital rotational atherectomy, PT and stenting using a covered stent of a calcified ostial right common iliac artery; for consideration as an outpatient of PCI to the CTO left CIA. F/u in office w/ ABIs.    HTN - SBP 130s-170s. HR is OK. MD advise on increasing lisinopril. Renal function OK after cath.   Plan - d/c today.  Signed, Lonn [name] , PA-C 8:26 AM 09/05/2013 Patient seen and examined and history reviewed. Agree with above findings and plan. Feels well this am. No leg or groin pain. Small hematoma in right groin. Soft without bruit. BP elevated this am but review of his office records show good BP control. He states he is just anxious. Will give am meds and monitor. Would continue same BP meds for now. On DAPT with ASA and Brilinta. DC home today with follow up with [name].  Rhylee Nunn M Martinique, [place] 09/05/2013 8:54 AM

## 2013-09-05 NOTE — Discharge Summary (Signed)
Patient seen and examined and history reviewed. Agree with above findings and plan. See earlier rounding note.   Zayveon Raschke M Chesley Veasey, MDFACC 09/05/2013 2:42 PM    

## 2013-09-19 ENCOUNTER — Ambulatory Visit (HOSPITAL_COMMUNITY)
Admission: RE | Admit: 2013-09-19 | Discharge: 2013-09-19 | Disposition: A | Discharge status: Home / Self Care | Payer: Medicare HMO | Source: Ambulatory Visit | Attending: Cardiovascular Disease | Admitting: Cardiovascular Disease

## 2013-09-19 DIAGNOSIS — I739 Peripheral vascular disease, unspecified: Secondary | ICD-10-CM

## 2013-09-19 DIAGNOSIS — I70219 Atherosclerosis of native arteries of extremities with intermittent claudication, unspecified extremity: Secondary | ICD-10-CM | POA: Insufficient documentation

## 2013-09-19 NOTE — Progress Notes (Signed)
Limited Right Lower Extremity Arterial Duplex Completed. [place]

## 2013-09-26 ENCOUNTER — Ambulatory Visit (INDEPENDENT_AMBULATORY_CARE_PROVIDER_SITE_OTHER): Payer: Commercial Managed Care - HMO | Admitting: Cardiovascular Disease

## 2013-09-26 ENCOUNTER — Encounter: Payer: Self-pay | Admitting: Cardiovascular Disease

## 2013-09-26 VITALS — BP 188/80 | HR 72 | Ht 68.0 in | Wt 186.0 lb

## 2013-09-26 DIAGNOSIS — D689 Coagulation defect, unspecified: Secondary | ICD-10-CM

## 2013-09-26 DIAGNOSIS — I739 Peripheral vascular disease, unspecified: Secondary | ICD-10-CM

## 2013-09-26 DIAGNOSIS — Z79899 Other long term (current) drug therapy: Secondary | ICD-10-CM

## 2013-09-26 MED ORDER — CLOPIDOGREL BISULFATE 75 MG PO TABS: 75.0000 mg | ORAL_TABLET | Freq: Every day | ORAL | Status: DC

## 2013-09-26 NOTE — Assessment & Plan Note (Signed)
On statin therapy followed by his PCP with the most recent lipid profile performed 12/27/12 revealing a total cholesterol of 138, LDL 71 and HDL of 29

## 2013-09-26 NOTE — Assessment & Plan Note (Addendum)
Patient has lifestyle limiting claudication. I performed angiography on him 09/04/13 revealing a 90% calcified ostial right common iliac artery stenosis, occluded left common iliac artery with a small abdominal aortic aneurysm measuring approximately 2 cm. I performed diamondback orbital rotational arthrectomy, and stenting with an ICast  covered stent. The patient had excellent angiographic and clinical result. His Dopplers normalized. He no longer has right lower extremity claudication. Is limited now by his left leg which he wishes me to attempt to revascularize percutaneously.It should be noted that his symptoms prior to intervention would attribute to arthritis.

## 2013-09-26 NOTE — Assessment & Plan Note (Signed)
Controlled on current medications 

## 2013-09-26 NOTE — Patient Instructions (Signed)
[name] has ordered a peripheral angiogram to be done at [facility].  This procedure is going to look at the bloodflow in your lower extremities.  If [name] is able to open up the arteries, you will have to spend one night in the hospital.  If he is not able to open the arteries, you will be able to go home that same day.    After the procedure, you will not be allowed to drive for 3 days or push, pull, or lift anything greater than 10 lbs for one week.    You will be required to have the following tests prior to the procedure:  1. Blood work-the blood work can be done no more than 7 days prior to the procedure.  It can be done at any [facility] lab.  There is one downstairs on the first floor of this building and one in the [facility] [place] ([address])   *REPS Todd Left groin   As of 09/27/13 morning: Stop Brilinta Start Plavix 75mg .  Take 4 tablets the morning of July 1, then one tablet daily starting July 2.   Have blood work done to check the effectiveness of Plavix in 2 weeks. The blood work must be done at [facility].

## 2013-09-26 NOTE — Progress Notes (Signed)
09/26/2013 [name] [name]   July 10, 1934  [id number]  Primary Physician Sallee Lange, MD Primary Cardiologist: Lorretta Harp MD [name], [name]   HPI:  [name] [name] is a 79 y.o. 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 He was admitted on 07/02/13 for Anterior STEMI. Presenting symptoms included severe epigastric burning pain/ indigestion and left arm pain. Initial troponin was > 20.0. He underwent emergent LHC and successful aspiration thrombectomy, PCI and stenting of proximal LAD by [name], via the right femoral artery . The overall LVEF was estimated at 50%, without wall motion abnormalities. He tolerated the procedure well and left the cath lab in stable condition. DAPT with ASA and Brilinta was initiated. He was also placed on a BB, ACE-I and statin. His post PCI course was w/o complication. He was discharged home on 07/05/13.  It should also be noted that at time of emergent LHC, he was found to also have significant peripheral vascular disease with an occluded left iliac and high-grade right common iliac artery stenosis, as well as a small AAA. The patient complained of bilateral LEE. [name] ordered for him to undergo bilateral LEAs. The study was performed in our office on 07/18/13. Findings are as follows: ABI 0.95 on Rt, 0.59 on Lt.the patient underwent angiography, diamondback orbital rotational arthrectomy, PTA and stenting of a highly calcified ostial right common iliac artery stenosis. He had excellent angiographic and clinical result. His Dopplers normalized to no longer has right lower extremity claudication. He wishes to proceed with attempt at percutaneous opacification of his left common iliac chronic total occlusion. He is now tolerating his Brilenta because of shortness of breath which transition to Plavix and check a verifying a test.   Current Outpatient Prescriptions  Medication Sig  Dispense Refill  . amLODipine (NORVASC) 10 MG tablet Take 1 tablet (10 mg total) by mouth daily.  90 tablet  1  . aspirin 81 MG chewable tablet Chew 1 tablet (81 mg total) by mouth daily.      [name] HYDROcodone-acetaminophen (NORCO/VICODIN) 5-325 MG per tablet Take one tablet by mouth twice daily as needed for pain  60 tablet  0  . lisinopril (PRINIVIL,ZESTRIL) 10 MG tablet Take 1 tablet (10 mg total) by mouth daily.  90 tablet  3  . methylcellulose (ARTIFICIAL TEARS) 1 % ophthalmic solution Place 1 drop into both eyes as needed (for dry eyes).      . metoprolol (LOPRESSOR) 50 MG tablet Take 50 mg by mouth 2 (two) times daily.      . nitroGLYCERIN (NITROSTAT) 0.4 MG SL tablet Place 1 tablet (0.4 mg total) under the tongue every 5 (five) minutes as needed for chest pain.  25 tablet  2  . rosuvastatin (CRESTOR) 10 MG tablet Take 1 tablet (10 mg total) by mouth daily at 6 PM.  90 tablet  3  . traMADol (ULTRAM) 50 MG tablet Take 1 tablet (50 mg total) by mouth 3 (three) times daily.  90 tablet  5  . clopidogrel (PLAVIX) 75 MG tablet Take 1 tablet (75 mg total) by mouth daily.  34 tablet  3   No current facility-administered medications for this visit.    Allergies  Allergen Reactions  . Brilinta [Ticagrelor]     SOB  . Lipitor [Atorvastatin] Other (See Comments)    Muscle and joint pain    History   Social History  . Marital Status:  Married    Spouse Name: N/A    Number of Children: N/A  . Years of Education: N/A   Occupational History  . Not on file.   Social History Main Topics  . Smoking status: Former Smoker -- 0.50 packs/day for 7 years    Types: Cigarettes  . Smokeless tobacco: Never Used     Comment: 09/04/2013 "quit smoking cigarettes ~ age [age]"  . Alcohol Use: No  . Drug Use: No  . Sexual Activity: Not Currently   Other Topics Concern  . Not on file   Social History Narrative  . No narrative on file     Review of Systems: General: negative for chills, fever, night  sweats or weight changes.  Cardiovascular: negative for chest pain, dyspnea on exertion, edema, orthopnea, palpitations, paroxysmal nocturnal dyspnea or shortness of breath Dermatological: negative for rash Respiratory: negative for cough or wheezing Urologic: negative for hematuria Abdominal: negative for nausea, vomiting, diarrhea, bright red blood per rectum, melena, or hematemesis Neurologic: negative for visual changes, syncope, or dizziness All other systems reviewed and are otherwise negative except as noted above.    Blood pressure 188/80, pulse 72, height 5\' 8"  (1.727 m), weight 186 lb (84.369 kg).  General appearance: alert and no distress Neck: no adenopathy, no carotid bruit, no JVD, supple, symmetrical, trachea midline and thyroid not enlarged, symmetric, no tenderness/mass/nodules Lungs: clear to auscultation bilaterally Heart: regular rate and rhythm, S1, S2 normal, no murmur, click, rub or gallop Extremities: extremities normal, atraumatic, no cyanosis or edema and his right femoral artery presents a well-healed, he has a 2+ right pedal pulse  EKG not performed today  ASSESSMENT AND PLAN:   STEMI (ST elevation myocardial infarction) Status post remote MI back in 2002 with stenting of his PDA. He had angina or myocardial infarction 07/02/13 and had stenting of his LAD. His EF is 50% without focal wall motion analysis. He is placed on dual antibiotic therapy with aspirin and Brilenta however he is not tolerating his Brilenta. I'm going to transition him to Plavix and will check a verify I now test to assess efficacy.  Hyperlipidemia On statin therapy followed by his PCP with the most recent lipid profile performed 12/27/12 revealing a total cholesterol of 138, LDL 71 and HDL of 29  Essential hypertension, benign Controlled on current medications  Claudication Patient has lifestyle limiting claudication. I performed angiography on him 09/04/13 revealing a 90% calcified ostial  right common iliac artery stenosis, occluded left common iliac artery with a small abdominal aortic aneurysm measuring approximately 2 cm. I performed diamondback orbital rotational arthrectomy, and stenting with an ICast  covered stent. The patient had excellent angiographic and clinical result. His Dopplers normalized. He no longer has right lower extremity claudication. Is limited now by his left leg which he wishes me to attempt to revascularize percutaneously.It should be noted that his symptoms prior to intervention would attribute to arthritis.      Lorretta Harp MD FACP,FACC,FAHA, [facility] 09/26/2013 4:09 PM

## 2013-09-26 NOTE — Assessment & Plan Note (Signed)
Status post remote MI back in 2002 with stenting of his PDA. He had angina or myocardial infarction 07/02/13 and had stenting of his LAD. His EF is 50% without focal wall motion analysis. He is placed on dual antibiotic therapy with aspirin and Brilenta however he is not tolerating his Brilenta. I'm going to transition him to Plavix and will check a verify I now test to assess efficacy.

## 2013-09-27 ENCOUNTER — Telehealth: Payer: Self-pay | Admitting: *Deleted

## 2013-09-27 HISTORY — PX: ILIAC ARTERY STENT: SHX1786

## 2013-09-27 NOTE — Telephone Encounter (Signed)
PA was approved. 

## 2013-09-27 NOTE — Telephone Encounter (Signed)
I called [employer] and did the PA over the phone.  Patient was changed from Brilinta to plavix due to SOB.  He recently had a STEMI.  The reference number for the PA [id number].  I am awaiting the response from [facility].

## 2013-10-05 LAB — PROTIME-INR
INR: 1 (ref <1.50)
PROTHROMBIN TIME: 13.2 s (ref 11.6–15.2)

## 2013-10-05 LAB — APTT: APTT: 28 s (ref 24–37)

## 2013-10-06 LAB — CBC
HEMATOCRIT: 41.6 % (ref 39.0–52.0)
HEMOGLOBIN: 13.7 g/dL (ref 13.0–17.0)
MCH: 28.2 pg (ref 26.0–34.0)
MCHC: 32.9 g/dL (ref 30.0–36.0)
MCV: 85.8 fL (ref 78.0–100.0)
Platelets: 142 10*3/uL — ABNORMAL LOW (ref 150–400)
RBC: 4.85 MIL/uL (ref 4.22–5.81)
RDW: 14.7 % (ref 11.5–15.5)
WBC: 8.8 10*3/uL (ref 4.0–10.5)

## 2013-10-06 LAB — BASIC METABOLIC PANEL
BUN: 18 mg/dL (ref 6–23)
CALCIUM: 8.9 mg/dL (ref 8.4–10.5)
CO2: 25 mEq/L (ref 19–32)
Chloride: 103 mEq/L (ref 96–112)
Creat: 0.9 mg/dL (ref 0.50–1.35)
Glucose, Bld: 102 mg/dL — ABNORMAL HIGH (ref 70–99)
Potassium: 4.4 mEq/L (ref 3.5–5.3)
Sodium: 139 mEq/L (ref 135–145)

## 2013-10-09 ENCOUNTER — Encounter (HOSPITAL_COMMUNITY): Payer: Self-pay | Admitting: Pharmacy Technician

## 2013-10-09 ENCOUNTER — Encounter: Payer: Self-pay | Admitting: *Deleted

## 2013-10-12 ENCOUNTER — Encounter (HOSPITAL_COMMUNITY): Payer: Self-pay | Admitting: *Deleted

## 2013-10-12 ENCOUNTER — Ambulatory Visit (HOSPITAL_COMMUNITY)
Admission: RE | Admit: 2013-10-12 | Discharge: 2013-10-13 | Disposition: A | Discharge status: Home / Self Care | Payer: Medicare HMO | Source: Ambulatory Visit | Attending: Cardiovascular Disease | Admitting: Cardiovascular Disease

## 2013-10-12 ENCOUNTER — Other Ambulatory Visit: Payer: Self-pay | Admitting: *Deleted

## 2013-10-12 ENCOUNTER — Encounter (HOSPITAL_COMMUNITY)
Admission: RE | Disposition: A | Discharge status: Home / Self Care | Payer: Commercial Managed Care - HMO | Source: Ambulatory Visit | Attending: Cardiovascular Disease

## 2013-10-12 DIAGNOSIS — I7092 Chronic total occlusion of artery of the extremities: Secondary | ICD-10-CM | POA: Insufficient documentation

## 2013-10-12 DIAGNOSIS — I6529 Occlusion and stenosis of unspecified carotid artery: Secondary | ICD-10-CM | POA: Insufficient documentation

## 2013-10-12 DIAGNOSIS — Z9861 Coronary angioplasty status: Secondary | ICD-10-CM | POA: Insufficient documentation

## 2013-10-12 DIAGNOSIS — I252 Old myocardial infarction: Secondary | ICD-10-CM | POA: Insufficient documentation

## 2013-10-12 DIAGNOSIS — I714 Abdominal aortic aneurysm, without rupture, unspecified: Secondary | ICD-10-CM | POA: Insufficient documentation

## 2013-10-12 DIAGNOSIS — Z7902 Long term (current) use of antithrombotics/antiplatelets: Secondary | ICD-10-CM | POA: Insufficient documentation

## 2013-10-12 DIAGNOSIS — Z87891 Personal history of nicotine dependence: Secondary | ICD-10-CM | POA: Insufficient documentation

## 2013-10-12 DIAGNOSIS — I2510 Atherosclerotic heart disease of native coronary artery without angina pectoris: Secondary | ICD-10-CM | POA: Insufficient documentation

## 2013-10-12 DIAGNOSIS — I739 Peripheral vascular disease, unspecified: Secondary | ICD-10-CM | POA: Diagnosis present

## 2013-10-12 DIAGNOSIS — I70219 Atherosclerosis of native arteries of extremities with intermittent claudication, unspecified extremity: Secondary | ICD-10-CM | POA: Insufficient documentation

## 2013-10-12 DIAGNOSIS — E785 Hyperlipidemia, unspecified: Secondary | ICD-10-CM | POA: Insufficient documentation

## 2013-10-12 DIAGNOSIS — Z7982 Long term (current) use of aspirin: Secondary | ICD-10-CM | POA: Insufficient documentation

## 2013-10-12 DIAGNOSIS — I10 Essential (primary) hypertension: Secondary | ICD-10-CM | POA: Insufficient documentation

## 2013-10-12 HISTORY — DX: Hyperlipidemia, unspecified: E78.5

## 2013-10-12 HISTORY — DX: Peripheral vascular disease, unspecified: I73.9

## 2013-10-12 LAB — POCT ACTIVATED CLOTTING TIME
ACTIVATED CLOTTING TIME: 197 s
ACTIVATED CLOTTING TIME: 157 s
Activated Clotting Time: 191 seconds
Activated Clotting Time: 219 seconds
Activated Clotting Time: 219 seconds

## 2013-10-12 LAB — MRSA PCR SCREENING: MRSA by PCR: NEGATIVE

## 2013-10-12 SURGERY — ABDOMINAL ANGIOGRAM

## 2013-10-12 MED ORDER — CLOPIDOGREL BISULFATE 75 MG PO TABS
75.0000 mg | ORAL_TABLET | Freq: Every day | ORAL | Status: DC
  Administered 2013-10-13: 75 mg via ORAL
  Filled 2013-10-12: qty 1

## 2013-10-12 MED ORDER — MIDAZOLAM HCL 2 MG/2ML IJ SOLN
INTRAMUSCULAR | Status: AC
  Filled 2013-10-12: qty 2

## 2013-10-12 MED ORDER — ASPIRIN 81 MG PO CHEW
CHEWABLE_TABLET | ORAL | Status: AC
  Filled 2013-10-12: qty 1

## 2013-10-12 MED ORDER — HEPARIN (PORCINE) IN NACL 2-0.9 UNIT/ML-% IJ SOLN
INTRAMUSCULAR | Status: AC
  Filled 2013-10-12: qty 1000

## 2013-10-12 MED ORDER — FENTANYL CITRATE 0.05 MG/ML IJ SOLN
INTRAMUSCULAR | Status: AC
  Filled 2013-10-12: qty 2

## 2013-10-12 MED ORDER — HYDRALAZINE HCL 20 MG/ML IJ SOLN
10.0000 mg | INTRAMUSCULAR | Status: DC | PRN
  Filled 2013-10-12: qty 1

## 2013-10-12 MED ORDER — ASPIRIN 81 MG PO CHEW: 81.0000 mg | CHEWABLE_TABLET | ORAL | Status: DC

## 2013-10-12 MED ORDER — SODIUM CHLORIDE 0.9 % IV SOLN: INTRAVENOUS | Status: AC

## 2013-10-12 MED ORDER — SODIUM CHLORIDE 0.9 % IV SOLN
INTRAVENOUS | Status: DC
  Administered 2013-10-12: 06:00:00 via INTRAVENOUS

## 2013-10-12 MED ORDER — ASPIRIN 81 MG PO CHEW: 81.0000 mg | CHEWABLE_TABLET | Freq: Every day | ORAL | Status: DC

## 2013-10-12 MED ORDER — PROTAMINE SULFATE 10 MG/ML IV SOLN
INTRAVENOUS | Status: AC
  Filled 2013-10-12: qty 5

## 2013-10-12 MED ORDER — CLOPIDOGREL BISULFATE 75 MG PO TABS: 75.0000 mg | ORAL_TABLET | Freq: Every day | ORAL | Status: DC

## 2013-10-12 MED ORDER — SODIUM CHLORIDE 0.9 % IJ SOLN
3.0000 mL | INTRAMUSCULAR | Status: DC | PRN
Start: 2013-10-12 — End: 2013-10-12

## 2013-10-12 MED ORDER — ACETAMINOPHEN 500 MG PO TABS
1000.0000 mg | ORAL_TABLET | Freq: Three times a day (TID) | ORAL | Status: DC
  Administered 2013-10-12 – 2013-10-13 (×3): 1000 mg via ORAL
  Filled 2013-10-12 (×5): qty 2

## 2013-10-12 MED ORDER — LISINOPRIL 10 MG PO TABS
10.0000 mg | ORAL_TABLET | Freq: Every day | ORAL | Status: DC
  Administered 2013-10-13: 10 mg via ORAL
  Filled 2013-10-12: qty 1

## 2013-10-12 MED ORDER — METOPROLOL TARTRATE 50 MG PO TABS
50.0000 mg | ORAL_TABLET | Freq: Two times a day (BID) | ORAL | Status: DC
  Administered 2013-10-12 – 2013-10-13 (×2): 50 mg via ORAL
  Filled 2013-10-12 (×3): qty 1
  Filled 2013-10-12: qty 2

## 2013-10-12 MED ORDER — NITROGLYCERIN 0.4 MG SL SUBL: 0.4000 mg | SUBLINGUAL_TABLET | SUBLINGUAL | Status: DC | PRN

## 2013-10-12 MED ORDER — AMLODIPINE BESYLATE 10 MG PO TABS
10.0000 mg | ORAL_TABLET | Freq: Every day | ORAL | Status: DC
  Administered 2013-10-13: 10 mg via ORAL
  Filled 2013-10-12: qty 1

## 2013-10-12 MED ORDER — ACETAMINOPHEN 325 MG PO TABS: 650.0000 mg | ORAL_TABLET | ORAL | Status: DC | PRN

## 2013-10-12 MED ORDER — LIDOCAINE HCL (PF) 1 % IJ SOLN
INTRAMUSCULAR | Status: AC
  Filled 2013-10-12: qty 30

## 2013-10-12 MED ORDER — TRAMADOL HCL 50 MG PO TABS
50.0000 mg | ORAL_TABLET | Freq: Three times a day (TID) | ORAL | Status: DC
  Administered 2013-10-12 – 2013-10-13 (×3): 50 mg via ORAL
  Filled 2013-10-12 (×3): qty 1

## 2013-10-12 MED ORDER — ASPIRIN EC 325 MG PO TBEC
325.0000 mg | DELAYED_RELEASE_TABLET | Freq: Every day | ORAL | Status: DC
  Administered 2013-10-13: 325 mg via ORAL
  Filled 2013-10-12: qty 1

## 2013-10-12 MED ORDER — ONDANSETRON HCL 4 MG/2ML IJ SOLN: 4.0000 mg | Freq: Four times a day (QID) | INTRAMUSCULAR | Status: DC | PRN

## 2013-10-12 NOTE — Progress Notes (Signed)
Manual pressure X 20 minutes. No further bleeding left groin. Hematoma semi firm and puffy. Tegaderm dressing applied left groin.

## 2013-10-12 NOTE — Progress Notes (Signed)
Site area: rt femoral  Site Prior to Removal:  Level 0  Pressure Applied For 20 MINUTES    Manual:   yes  Patient Status During Pull: stable Post Pull Groin Site:  Level 0  Post Pull Instructions Given:  yes  Post Pull Pulses Present: yes  Dressing Applied:  tegaderm dresing   Bedrest begins 1110 Comments VSS. Hematoma left groin unchanged; level 2

## 2013-10-12 NOTE — Progress Notes (Signed)
Right femoral arterial sheath removed by GGarrett.

## 2013-10-12 NOTE — Interval H&P Note (Signed)
History and Physical Interval Note:  10/12/2013 7:36 AM  [name] [name]  has presented today for surgery, with the diagnosis of  pad  The various methods of treatment have been discussed with the patient and family. After consideration of risks, benefits and other options for treatment, the patient has consented to  Procedure(s): LOWER EXTREMITY ANGIOGRAM (N/A) as a surgical intervention .  The patient's history has been reviewed, patient examined, no change in status, stable for surgery.  I have reviewed the patient's chart and labs.  Questions were answered to the patient's satisfaction.     Lorretta Harp

## 2013-10-12 NOTE — CV Procedure (Signed)
HERMILO DUTTER is a 79 y.o. male    [id number] LOCATION:  FACILITY: [facility]  PHYSICIAN: Quay Burow, M.D. Dec 14, 1934   DATE OF PROCEDURE:  10/12/2013  DATE OF DISCHARGE:     PV Angiogram/Intervention    History obtained from chart review.DEIONDRE HARROWER is a 79 y.o. 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 He was admitted on 07/02/13 for Anterior STEMI. Presenting symptoms included severe epigastric burning pain/ indigestion and left arm pain. Initial troponin was > 20.0. He underwent emergent LHC and successful aspiration thrombectomy, PCI and stenting of proximal LAD by [name], via the right femoral artery . The overall LVEF was estimated at 50%, without wall motion abnormalities. He tolerated the procedure well and left the cath lab in stable condition. DAPT with ASA and Brilinta was initiated. He was also placed on a BB, ACE-I and statin. His post PCI course was w/o complication. He was discharged home on 07/05/13.  It should also be noted that at time of emergent LHC, he was found to also have significant peripheral vascular disease with an occluded left iliac and high-grade right common iliac artery stenosis, as well as a small AAA. The patient complained of bilateral LEE. [name] ordered for him to undergo bilateral LEAs. The study was performed in our office on 07/18/13. Findings are as follows: ABI 0.95 on Rt, 0.59 on Lt.the patient underwent angiography, diamondback orbital rotational arthrectomy, PTA and stenting of a highly calcified ostial right common iliac artery stenosis. He had excellent angiographic and clinical result. His Dopplers normalized to no longer has right lower extremity claudication. He wishes to proceed with attempt at percutaneous opacification of his left common iliac chronic total occlusion. He is not tolerating his Brilenta because of shortness of breath which transition to Plavix and  check a verifying a test He presents today for attempt at percutaneous revascularization of his left common iliac chronic total occlusion.    PROCEDURE DESCRIPTION:   The patient was brought to the second floor [facility] cath lab in the postabsorptive state. He was premedicated with Valium 5 mg by mouth, IV Versed and fentanyl. His right and left groinsWere prepped and shaved in usual sterile fashion. Xylocaine 1% was used for local anesthesia. A 5 French sheath was inserted into the right common femoral artery. A 7 French sheath was inserted into the left common femoral artery using standard Seldinger technique. A 5 French pigtail catheter was placed in the distal abdominal aorta and abdominal aortography was performed.  I used a Viance  CTO catheter along with an 014 [place] core wire to cross the chronic total occlusion. Once I demonstrated that was within the abdominal aorta I used a 4 mm x 4 cm 35 balloon to predilate the CTO. . I then exchanged the [place] core wire for a 0.35 Versicore wire. 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 Cast covered stent and post dilated with an 8 mm x 2 cm balloon resulting in reduction of a chronic total occlusion to 0% residual with excellent flow. There is no gradient at the end of the case. A distal abdominal aortogram was performed. The 7 French sheath in the left common femoral was exchanged for an 8 [place] short sheath which then resulted in some oozing around the sheath requiring manual compression of the small hematoma. Because of  continued oozing and at, and with an ending ACT 197 I elected to give 20 mg of protamine to further reduce the ACT prior to pulling the sheath in the Cath Lab.   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 The patient tolerated the procedure well. The sheaths will be removed once ACT hospital and 70 pressure being held in both groins. The patient will be treated with stool at that therapy, hydrated overnight and discharged in the morning. Will get followup lower extremity arterial Doppler studies in our [place] office after which he will be seen back.      Lorretta Harp MD, [facility] 10/12/2013 9:10 AM

## 2013-10-12 NOTE — H&P (View-Only) (Signed)
09/26/2013 [name]   11/12/1934  [id number]  Primary Physician Sallee Lange, MD Primary Cardiologist: Lorretta Harp MD [place], [name]   HPI:  [name] [name] is a 79 y.o. 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 He was admitted on 07/02/13 for Anterior STEMI. Presenting symptoms included severe epigastric burning pain/ indigestion and left arm pain. Initial troponin was > 20.0. He underwent emergent LHC and successful aspiration thrombectomy, PCI and stenting of proximal LAD by [name], via the right femoral artery . The overall LVEF was estimated at 50%, without wall motion abnormalities. He tolerated the procedure well and left the cath lab in stable condition. DAPT with ASA and Brilinta was initiated. He was also placed on a BB, ACE-I and statin. His post PCI course was w/o complication. He was discharged home on 07/05/13.  It should also be noted that at time of emergent LHC, he was found to also have significant peripheral vascular disease with an occluded left iliac and high-grade right common iliac artery stenosis, as well as a small AAA. The patient complained of bilateral LEE. [name] ordered for him to undergo bilateral LEAs. The study was performed in our office on 07/18/13. Findings are as follows: ABI 0.95 on Rt, 0.59 on Lt.the patient underwent angiography, diamondback orbital rotational arthrectomy, PTA and stenting of a highly calcified ostial right common iliac artery stenosis. He had excellent angiographic and clinical result. His Dopplers normalized to no longer has right lower extremity claudication. He wishes to proceed with attempt at percutaneous opacification of his left common iliac chronic total occlusion. He is now tolerating his Brilenta because of shortness of breath which transition to Plavix and check a verifying a test.   Current Outpatient Prescriptions  Medication Sig  Dispense Refill  . amLODipine (NORVASC) 10 MG tablet Take 1 tablet (10 mg total) by mouth daily.  90 tablet  1  . aspirin 81 MG chewable tablet Chew 1 tablet (81 mg total) by mouth daily.      [name] HYDROcodone-acetaminophen (NORCO/VICODIN) 5-325 MG per tablet Take one tablet by mouth twice daily as needed for pain  60 tablet  0  . lisinopril (PRINIVIL,ZESTRIL) 10 MG tablet Take 1 tablet (10 mg total) by mouth daily.  90 tablet  3  . methylcellulose (ARTIFICIAL TEARS) 1 % ophthalmic solution Place 1 drop into both eyes as needed (for dry eyes).      . metoprolol (LOPRESSOR) 50 MG tablet Take 50 mg by mouth 2 (two) times daily.      . nitroGLYCERIN (NITROSTAT) 0.4 MG SL tablet Place 1 tablet (0.4 mg total) under the tongue every 5 (five) minutes as needed for chest pain.  25 tablet  2  . rosuvastatin (CRESTOR) 10 MG tablet Take 1 tablet (10 mg total) by mouth daily at 6 PM.  90 tablet  3  . traMADol (ULTRAM) 50 MG tablet Take 1 tablet (50 mg total) by mouth 3 (three) times daily.  90 tablet  5  . clopidogrel (PLAVIX) 75 MG tablet Take 1 tablet (75 mg total) by mouth daily.  34 tablet  3   No current facility-administered medications for this visit.    Allergies  Allergen Reactions  . Brilinta [Ticagrelor]     SOB  . Lipitor [Atorvastatin] Other (See Comments)    Muscle and joint pain    History   Social History  . Marital Status:  Married    Spouse Name: N/A    Number of Children: N/A  . Years of Education: N/A   Occupational History  . Not on file.   Social History Main Topics  . Smoking status: Former Smoker -- 0.50 packs/day for 7 years    Types: Cigarettes  . Smokeless tobacco: Never Used     Comment: 09/04/2013 "quit smoking cigarettes ~ age [age]"  . Alcohol Use: No  . Drug Use: No  . Sexual Activity: Not Currently   Other Topics Concern  . Not on file   Social History Narrative  . No narrative on file     Review of Systems: General: negative for chills, fever, night  sweats or weight changes.  Cardiovascular: negative for chest pain, dyspnea on exertion, edema, orthopnea, palpitations, paroxysmal nocturnal dyspnea or shortness of breath Dermatological: negative for rash Respiratory: negative for cough or wheezing Urologic: negative for hematuria Abdominal: negative for nausea, vomiting, diarrhea, bright red blood per rectum, melena, or hematemesis Neurologic: negative for visual changes, syncope, or dizziness All other systems reviewed and are otherwise negative except as noted above.    Blood pressure 188/80, pulse 72, height 5\' 8"  (1.727 m), weight 186 lb (84.369 kg).  General appearance: alert and no distress Neck: no adenopathy, no carotid bruit, no JVD, supple, symmetrical, trachea midline and thyroid not enlarged, symmetric, no tenderness/mass/nodules Lungs: clear to auscultation bilaterally Heart: regular rate and rhythm, S1, S2 normal, no murmur, click, rub or gallop Extremities: extremities normal, atraumatic, no cyanosis or edema and his right femoral artery presents a well-healed, he has a 2+ right pedal pulse  EKG not performed today  ASSESSMENT AND PLAN:   STEMI (ST elevation myocardial infarction) Status post remote MI back in 2002 with stenting of his PDA. He had angina or myocardial infarction 07/02/13 and had stenting of his LAD. His EF is 50% without focal wall motion analysis. He is placed on dual antibiotic therapy with aspirin and Brilenta however he is not tolerating his Brilenta. I'm going to transition him to Plavix and will check a verify I now test to assess efficacy.  Hyperlipidemia On statin therapy followed by his PCP with the most recent lipid profile performed 12/27/12 revealing a total cholesterol of 138, LDL 71 and HDL of 29  Essential hypertension, benign Controlled on current medications  Claudication Patient has lifestyle limiting claudication. I performed angiography on him 09/04/13 revealing a 90% calcified ostial  right common iliac artery stenosis, occluded left common iliac artery with a small abdominal aortic aneurysm measuring approximately 2 cm. I performed diamondback orbital rotational arthrectomy, and stenting with an ICast  covered stent. The patient had excellent angiographic and clinical result. His Dopplers normalized. He no longer has right lower extremity claudication. Is limited now by his left leg which he wishes me to attempt to revascularize percutaneously.It should be noted that his symptoms prior to intervention would attribute to arthritis.      Lorretta Harp MD FACP,FACC,FAHA, [facility] 09/26/2013 4:09 PM

## 2013-10-13 ENCOUNTER — Other Ambulatory Visit: Payer: Self-pay | Admitting: Physician Assistant

## 2013-10-13 ENCOUNTER — Encounter (HOSPITAL_COMMUNITY): Payer: Self-pay | Admitting: Physician Assistant

## 2013-10-13 DIAGNOSIS — I739 Peripheral vascular disease, unspecified: Secondary | ICD-10-CM

## 2013-10-13 LAB — CBC
HCT: 35.2 % — ABNORMAL LOW (ref 39.0–52.0)
Hemoglobin: 11.4 g/dL — ABNORMAL LOW (ref 13.0–17.0)
MCH: 27.4 pg (ref 26.0–34.0)
MCHC: 32.4 g/dL (ref 30.0–36.0)
MCV: 84.6 fL (ref 78.0–100.0)
Platelets: 132 10*3/uL — ABNORMAL LOW (ref 150–400)
RBC: 4.16 MIL/uL — ABNORMAL LOW (ref 4.22–5.81)
RDW: 14 % (ref 11.5–15.5)
WBC: 7.8 10*3/uL (ref 4.0–10.5)

## 2013-10-13 LAB — BASIC METABOLIC PANEL
Anion gap: 14 (ref 5–15)
BUN: 18 mg/dL (ref 6–23)
CALCIUM: 8.4 mg/dL (ref 8.4–10.5)
CO2: 24 meq/L (ref 19–32)
CREATININE: 1 mg/dL (ref 0.50–1.35)
Chloride: 103 mEq/L (ref 96–112)
GFR calc Af Amer: 82 mL/min — ABNORMAL LOW (ref >90)
GFR, EST NON AFRICAN AMERICAN: 71 mL/min — AB (ref >90)
Glucose, Bld: 93 mg/dL (ref 70–99)
Potassium: 4 mEq/L (ref 3.7–5.3)
SODIUM: 141 meq/L (ref 137–147)

## 2013-10-13 LAB — PLATELET INHIBITION P2Y12: Platelet Function  P2Y12: 303 [PRU] (ref 194–418)

## 2013-10-13 MED ORDER — CLOPIDOGREL BISULFATE 75 MG PO TABS: 75.0000 mg | ORAL_TABLET | Freq: Every day | ORAL | Status: DC

## 2013-10-13 NOTE — Progress Notes (Signed)
D/c instructions reviewed with pt, his wife and daughter. Copy of instructions given to pt. Pt d/c'd via wheelchair with family with belongings, escorted by unit NT.

## 2013-10-13 NOTE — Plan of Care (Signed)
Problem: Phase II Progression Outcomes Goal: Vascular site scale level 0 - I Vascular Site Scale Level 0: No bruising/bleeding/hematoma Level I (Mild): Bruising/Ecchymosis, minimal bleeding/ooozing, palpable hematoma < 3 cm Level II (Moderate): Bleeding not affecting hemodynamic parameters, pseudoaneurysm, palpable hematoma > 3 cm Level III (Severe) Bleeding which affects hemodynamic parameters or retroperitoneal hemorrhage  Outcome: Adequate for Discharge Level 0 R groin Level 1 L groin, bruising remains, soft tissue to area

## 2013-10-13 NOTE — Discharge Instructions (Signed)
Please monitor your blood pressure occasionally at home. Call your doctor if you tend to get readings of greater than 140 on the top number or 90 on the bottom number.

## 2013-10-13 NOTE — Progress Notes (Signed)
Patient ID: [name] [name], male   DOB: July 22, 1934, 79 y.o.   MRN: [id number]    Subjective:  Denies SSCP, palpitations or Dyspnea Had BM  Not been ambulating   Objective:  Filed Vitals:   10/13/13 0456 10/13/13 0500 10/13/13 0720 10/13/13 0756  BP:  131/50 175/68 161/66  Pulse:  63 63   Temp: 98.2 F (36.8 C)  97.8 F (36.6 C)   TempSrc: Oral  Oral   Resp:  22 19   Height:      Weight:      SpO2:  94% 98%     Intake/Output from previous day:  Intake/Output Summary (Last 24 hours) at 10/13/13 2595 Last data filed at 10/13/13 0848  Gross per 24 hour  Intake    240 ml  Output    745 ml  Net   -505 ml    Physical Exam: Affect appropriate Healthy:  appears stated age HEENT: normal Neck supple with no adenopathy JVP normal no bruits no thyromegaly Lungs clear with no wheezing and good diaphragmatic motion Heart:  S1/S2 no murmur, no rub, gallop or click PMI normal Abdomen: benighn, BS positve, no tenderness, no AAA no bruit.  No HSM or HJR S/P R/L femoral cannulation soft echymosis over left acces sight  No edema Neuro non-focal Skin warm and dry No muscular weakness   Lab Results: Basic Metabolic Panel:  Recent Labs  10/13/13 0314  NA 141  K 4.0  CL 103  CO2 24  GLUCOSE 93  BUN 18  CREATININE 1.00  CALCIUM 8.4   CBC:  Recent Labs  10/13/13 0314  WBC 7.8  HGB 11.4*  HCT 35.2*  MCV 84.6  PLT 132*    Imaging: No results found.  Cardiac Studies:  ECG: 5/15  SR rate 58 anterolateral T wave inversions    Telemetry:  NSR no arrhythmia   Echo:  4/15  EF 55%    Medications:   . acetaminophen  1,000 mg Oral TID  . amLODipine  10 mg Oral Daily  . aspirin EC  325 mg Oral Daily  . clopidogrel  75 mg Oral Q breakfast  . lisinopril  10 mg Oral Daily  . metoprolol  50 mg Oral BID  . 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 Mild echymosis but Soft and Hct stable  Ambulate  D/c f/u Dr Gwenlyn Found HTN:  Continue ACE   Jenkins Rouge 10/13/2013, 9:52 AM

## 2013-10-13 NOTE — Discharge Summary (Signed)
Discharge Summary   Patient ID: [name] [name] MRN: [id number], DOB/AGE: 04/29/1934 79 y.o. Admit date: 10/12/2013 D/C date:     10/13/2013  Primary Care Provider: Sallee Lange, MD Primary Cardiologist: [name] [name]  Primary Discharge Diagnoses:  1. 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 HTN  Secondary Discharge Diagnoses:  1. CAD s/p STEMI 2002 s/p stent to PDA, anterior STEMI 06/2013 s/p asp thrombectomy, DES to prox LAD 2. Hyperlipidemia 3. Thrombocytopenia 4. Stroke 2012  5. Carotid artery disease, followed by [facility]  Hospital Course: [name] [name] is a 79 y/o M with history of CAD (STEMI 2002 s/p stent to PDA, recent anterior STEMI 06/2013 s/p asp thrombectomy, DES to prox LAD), PVD s/p recent stent to ostial RCIA, HLD, small AAA who presented to [facility] for planned PV intervention. During his admission for STEMI he was noted to have significant PVD with occluded left iliac and high-grade right common iliac artery stenosis. There was question of small AAA but outpatient AAA duplex only showed mild dilation. Outpatient noninvasive testing showed ABI 0.95 on rt, 0.59 on left. He underwent recent stenting to his RLE as mentioned above, with plans for staged intervention on the left. Of note he was intolerant of Brilinta due to SOB and was transitioned to Plavix. He returned for repeat PV angiogram yesterday. 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 The patient tolerated the procedure well. He developed soft ecchymosis to his cath site after the  procedure but vitals remained stable along with hematocrit. The patient ambulated without complication and remarked that his legs actually felt so much better now when he does so. [name] [name] has seen and examined the patient today and feels he is stable for discharge. BP was running variable this admission from 112/75 to 160's - pt was instructed to monitor BP at home and call if tending to run high.  Of note at last OV Dr. Gwenlyn [name] wanted to f/u P2Y12 testing to assess Plavix responsiveness. We will draw this today. Per Dr. Gwenlyn [name], does not need to stay for the results but we will follow this up further. Please note prior history of stroke so would not be a candidate for Effient.  Discharge Vitals: Blood pressure 161/66, pulse 63, temperature 97.8 F (36.6 C), temperature source Oral, resp. rate 19, height 5\' 8"  (1.727 m), weight 185 lb 6.5 oz (84.1 kg), SpO2 98.00%. BP reflects before AM Meds - has since gotten these prior to discharge. Will be following at home and in followup. Range 112-160 this adm.  Labs: Lab Results  Component Value Date   WBC 7.8 10/13/2013   HGB 11.4* 10/13/2013   HCT 35.2* 10/13/2013   MCV 84.6 10/13/2013   PLT 132* 10/13/2013    Recent Labs Lab 10/13/13 0314  NA 141  K 4.0  CL 103  CO2 24  BUN 18  CREATININE 1.00  CALCIUM 8.4  GLUCOSE 93   Lab Results  Component Value Date   CHOL 138 12/27/2012   HDL 29* 12/27/2012   LDLCALC 71 12/27/2012   TRIG 192* 12/27/2012  Diagnostic Studies/Procedures   PV Angio 10/12/13  PV Angiogram/Intervention  History obtained from chart review.[name] [name] is a 79 y.o. 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 He was admitted on 07/02/13 for Anterior STEMI. Presenting symptoms included severe epigastric burning pain/ indigestion and left arm pain. Initial troponin was > 20.0. He underwent emergent LHC and successful aspiration thrombectomy, PCI and stenting  of proximal LAD by Dr. Gwenlyn [name], via the right femoral artery . The overall LVEF was estimated at 50%, without wall motion abnormalities. He tolerated the procedure well and left the cath lab in stable condition. DAPT with ASA and Brilinta was initiated. He was also placed on a BB, ACE-I and statin. His post PCI course was w/o complication. He was discharged home on 07/05/13.  It should also be noted that at time of emergent LHC, he was [name] to also have significant peripheral vascular disease with an occluded left iliac and high-grade right common iliac artery stenosis, as well as a small AAA. The patient complained of bilateral LEE. Dr. Gwenlyn [name] ordered for him to undergo bilateral LEAs. The study was performed in our office on 07/18/13. Findings are as follows: ABI 0.95 on Rt, 0.59 on Lt.the patient underwent angiography, diamondback orbital rotational arthrectomy, PTA and stenting of a highly calcified ostial right common iliac artery stenosis. He had excellent angiographic and clinical result. His Dopplers normalized to no longer has right lower extremity claudication. He wishes to proceed with attempt at percutaneous opacification of his left common iliac chronic total occlusion. He is not tolerating his Brilenta because of shortness of breath which transition to Plavix and check a verifying a test  He presents today for attempt at percutaneous revascularization of his left common iliac chronic total occlusion.  PROCEDURE DESCRIPTION:  The patient was brought to the second floor [facility] cath lab in the postabsorptive state. He was premedicated with Valium 5 mg by mouth, IV Versed and fentanyl. His right and left groinsWere prepped and shaved in usual sterile fashion. Xylocaine 1% was used for local anesthesia. A 5 French sheath was inserted into the right common femoral artery. A 7 French sheath was inserted into the left common femoral artery using standard Seldinger technique. A 5 French pigtail  catheter was placed in the distal abdominal aorta and abdominal aortography was performed.  I used a Viance CTO catheter along with an 014 [place] core wire to cross the chronic total occlusion. Once I demonstrated that was within the abdominal aorta I used a 4 mm x 4 cm 35 balloon to predilate the CTO. . I then exchanged the [place] core wire for a 0.35 Versicore wire. 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 Cast covered stent and post dilated with an 8 mm x 2 cm balloon resulting in reduction of a chronic total occlusion to 0% residual with excellent flow. There is no gradient at the end of the case. A distal abdominal aortogram was performed. The 7 French sheath in the left common femoral was exchanged for an 8 [place] short sheath which then resulted in some oozing around the sheath requiring manual compression of the small hematoma. Because of continued oozing and at, and with an ending ACT 197 I elected to give 20 mg of protamine to further reduce the ACT prior to pulling the sheath in the Cath Lab.  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 The patient tolerated the procedure well. The sheaths will be removed once ACT hospital and 70 pressure being held in both groins. The patient will be treated with stool at that therapy, hydrated overnight and discharged in the morning. Will get followup lower extremity arterial Doppler studies in our [facility] office after which he will be seen back.  Lorretta Harp MD, [facility]  10/12/2013  9:10 AM    Discharge Medications   Current Discharge Medication List    CONTINUE these medications which have CHANGED   Details  clopidogrel (PLAVIX) 75 MG tablet Take 1 tablet (75 mg total) by mouth daily. (NOTE: med was not changed. The original RX  still had instructions for patient to take loading dose the day it was prescribed- I erased that detail and continued usual dose)      CONTINUE these medications which have NOT CHANGED   Details  acetaminophen (TYLENOL) 500 MG tablet Take 1,000 mg by mouth 3 (three) times daily.    amLODipine (NORVASC) 10 MG tablet Take 1 tablet (10 mg total) by mouth daily.     aspirin 81 MG chewable tablet Chew 1 tablet (81 mg total) by mouth daily.    lisinopril (PRINIVIL,ZESTRIL) 10 MG tablet Take 1 tablet (10 mg total) by mouth daily.    Associated Diagnoses: CAD (coronary artery disease)    methylcellulose (ARTIFICIAL TEARS) 1 % ophthalmic solution Place 1 drop into both eyes as needed (for dry eyes).    metoprolol (LOPRESSOR) 50 MG tablet Take 50 mg by mouth 2 (two) times daily.    nitroGLYCERIN (NITROSTAT) 0.4 MG SL tablet Place 1 tablet (0.4 mg total) under the tongue every 5 (five) minutes as needed for chest pain.     rosuvastatin (CRESTOR) 10 MG tablet Take 1 tablet (10 mg total) by mouth daily at 6 PM.    Associated Diagnoses: CAD (coronary artery disease)    traMADol (ULTRAM) 50 MG tablet Take 1 tablet (50 mg total) by mouth 3 (three) times daily.         Disposition   The patient will be discharged in stable condition to home. Discharge Instructions   Diet - low sodium heart healthy    Complete by:  As directed      Increase activity slowly    Complete by:  As directed   No driving for 2 days. No lifting over 5 lbs for 1 week. No sexual activity for 1 week. Keep procedure site clean & dry. If you notice increased pain, swelling, bleeding or pus, call/return! You may shower, but no soaking baths/hot tubs/pools for 1 week.          Follow-up Information   Follow up with [facility]. (Followup left leg ultrasound 10/27/13 at 10am - do not eat or drink anything after midnight the night before, except sips of water with usual medicines as needed)    Specialty:   Cardiology   Contact information:   [address] [place] [address] [phone]      Follow up with Lorretta Harp, MD. (Our office will call you for a follow-up appointment. Please call the office if you have not heard from [place] within 3 days.)    Specialty:  Cardiology   Contact information:   [address] [place] [address] [phone]  Duration of Discharge Encounter: Greater than 30 minutes including physician and PA time.  Signed, Dayna Dunn PA-C 10/13/2013, 11:05 AM

## 2013-10-13 NOTE — Progress Notes (Signed)
Pt completed walk around the entire unit without any difficulty (with RN), tolerated well, denied any SOB, or pain. Pt stated feet and legs actually felt better and it did not hurt to walk like it has prior to procedure. Family stated they could tell he doing better due to his gait and facial expressions, much improved from PTA.   Sharrell Ku, PA notified of pt's tolerance of walk and how he felt walking, and lab P2Y12 had been drawn as ordered. Pt cleared for discharge.  Bandaids applied to bilat groin sites, R remains level 0, and L remains level 1, with bruising and soft tissue, pt was instructed on care and sites and what to do in case of bleeding or knot forming. Pt able to do teach back.

## 2013-10-13 NOTE — Progress Notes (Signed)
AM groin check: Left groin semi firm hematoma approx 5 inches wide w/large area of dark purple bruising. Unchanged from the last time I assessed patient yesterday. Right groin site level 0. Patient states he is going home today.

## 2013-10-16 ENCOUNTER — Telehealth: Payer: Self-pay | Admitting: Cardiovascular Disease

## 2013-10-16 MED ORDER — TICAGRELOR 90 MG PO TABS: 90.0000 mg | ORAL_TABLET | Freq: Two times a day (BID) | ORAL | Status: DC

## 2013-10-16 NOTE — Telephone Encounter (Signed)
Call received from Dr Gwenlyn Found concerning results of P2Y12 test.  The results showed that the Plavix was not effective.  Dr Gwenlyn Found wants patient to try Brilinta again.  I spoke with patient and he is agreeable.  He will contact me ASAP with any complications.

## 2013-10-27 ENCOUNTER — Ambulatory Visit (HOSPITAL_COMMUNITY)
Admission: RE | Admit: 2013-10-27 | Discharge: 2013-10-27 | Disposition: A | Discharge status: Home / Self Care | Payer: Medicare HMO | Source: Ambulatory Visit | Attending: Cardiovascular Disease | Admitting: Cardiovascular Disease

## 2013-10-27 DIAGNOSIS — I739 Peripheral vascular disease, unspecified: Secondary | ICD-10-CM | POA: Insufficient documentation

## 2013-10-27 DIAGNOSIS — I70219 Atherosclerosis of native arteries of extremities with intermittent claudication, unspecified extremity: Secondary | ICD-10-CM

## 2013-10-27 NOTE — Progress Notes (Signed)
Left Lower Ext. Arterial Duplex Completed. Oda Cogan, BS, RDMS, RVT

## 2013-10-28 DIAGNOSIS — I779 Disorder of arteries and arterioles, unspecified: Secondary | ICD-10-CM

## 2013-10-28 HISTORY — DX: Disorder of arteries and arterioles, unspecified: I77.9

## 2013-10-30 ENCOUNTER — Encounter: Payer: Self-pay | Admitting: Vascular Surgery

## 2013-10-31 ENCOUNTER — Encounter: Payer: Self-pay | Admitting: Vascular Surgery

## 2013-10-31 ENCOUNTER — Ambulatory Visit (HOSPITAL_COMMUNITY)
Admission: RE | Admit: 2013-10-31 | Discharge: 2013-10-31 | Disposition: A | Discharge status: Home / Self Care | Payer: Medicare HMO | Source: Ambulatory Visit | Attending: Vascular Surgery | Admitting: Vascular Surgery

## 2013-10-31 ENCOUNTER — Ambulatory Visit (INDEPENDENT_AMBULATORY_CARE_PROVIDER_SITE_OTHER): Payer: Medicare HMO | Admitting: Vascular Surgery

## 2013-10-31 VITALS — BP 155/69 | HR 53 | Ht 68.0 in | Wt 186.1 lb

## 2013-10-31 DIAGNOSIS — I6529 Occlusion and stenosis of unspecified carotid artery: Secondary | ICD-10-CM | POA: Diagnosis present

## 2013-10-31 DIAGNOSIS — I63239 Cerebral infarction due to unspecified occlusion or stenosis of unspecified carotid arteries: Secondary | ICD-10-CM

## 2013-10-31 NOTE — Progress Notes (Signed)
Subjective:     Patient ID: [name] [name], male   DOB: [birth year], 79 y.o.   MRN: [id number] 79 year old male returns for continued followup regarding his carotid occlusive disease. He suffered a left brain stroke and was found to have left ICA occlusion in the past. He has had a mild/moderate right ICA stenosis which is asymptomatic. His last ultrasound was done at [facility] and he had a repeat ultrasound done in our office today. He has had some stents placed recently by Dr. Donnella Bi for aortoiliac occlusive disease with resolution of claudication symptoms. He had a myocardial infarction on Easter Sunday this year requiring a stent by Dr. Alvester Chou. He is currently on aspirin and Brilinta  Past Medical History  Diagnosis Date  . Hypertension   . CAD (coronary artery disease) 2015    a. STEMI 2002 s/p stent to PDA. b. Anterior STEMI 06/2013 s/p  asp thrombectomy, DES to prox LAD, EF preserved.  . Stroke ~ 2012    a. 2012 - infarct. No bleed.  . Arthritis     "all over" (09/04/2013)  . Thrombocytopenia   . PVD (peripheral vascular disease) with claudication 2015    a. 08/2013: s/p diamondback orbital rotational atherectomy and 8 mm x 30 mm long ICast covered stent to calcified ostial right common iliac artery. b. 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  . Hyperlipidemia   . Carotid artery disease     History  Substance Use Topics  . Smoking status: Former Smoker -- 0.50 packs/day for 7 years    Types: Cigarettes  . Smokeless tobacco: Never Used     Comment: 09/04/2013 "quit smoking cigarettes ~ age [age]"  . Alcohol Use: No    Family History  Problem Relation Age of Onset  . Diabetes Mother   . Heart disease Mother   . Hyperlipidemia Mother   . Hypertension Mother   . Heart disease Father   . Hyperlipidemia Father   . Hypertension Father   . Heart attack Father   . Diabetes Sister   . Heart disease Sister   .  Hyperlipidemia Sister   . Hypertension Sister   . Diabetes Brother   . Heart disease Brother     before age 11  . Hyperlipidemia Brother   . Hypertension Brother   . Heart attack Brother     Allergies  Allergen Reactions  . Brilinta [Ticagrelor]     SOB  . Lipitor [Atorvastatin] Other (See Comments)    Muscle and joint pain    Current outpatient prescriptions:acetaminophen (TYLENOL) 500 MG tablet, Take 1,000 mg by mouth 3 (three) times daily., Disp: , Rfl: ;  amLODipine (NORVASC) 10 MG tablet, Take 1 tablet (10 mg total) by mouth daily., Disp: 90 tablet, Rfl: 1;  aspirin 81 MG chewable tablet, Chew 1 tablet (81 mg total) by mouth daily., Disp: , Rfl: ;  lisinopril (PRINIVIL,ZESTRIL) 10 MG tablet, Take 1 tablet (10 mg total) by mouth daily., Disp: 90 tablet, Rfl: 3 methylcellulose (ARTIFICIAL TEARS) 1 % ophthalmic solution, Place 1 drop into both eyes as needed (for dry eyes)., Disp: , Rfl: ;  metoprolol (LOPRESSOR) 50 MG tablet, Take 50 mg by mouth 2 (two) times daily., Disp: , Rfl: ;  nitroGLYCERIN (NITROSTAT) 0.4 MG SL tablet, Place 1 tablet (0.4 mg total) under the tongue every 5 (five) minutes as needed for chest pain., Disp: 25 tablet, Rfl: 2 rosuvastatin (CRESTOR) 10 MG tablet,  Take 1 tablet (10 mg total) by mouth daily at 6 PM., Disp: 90 tablet, Rfl: 3;  ticagrelor (BRILINTA) 90 MG TABS tablet, Take 1 tablet (90 mg total) by mouth 2 (two) times daily., Disp: 60 tablet, Rfl: 6;  traMADol (ULTRAM) 50 MG tablet, Take 1 tablet (50 mg total) by mouth 3 (three) times daily., Disp: 90 tablet, Rfl: 5  BP 155/69  Pulse 53  Ht 5\' 8"  (1.727 m)  Wt 186 lb 1.6 oz (84.414 kg)  BMI 28.30 kg/m2  SpO2 100%  Body mass index is 28.3 kg/(m^2).         HPI   Review of Systems denies chest pain but does have mild dyspnea on exertion. Other systems negative and a complete review of systems    Objective:   Physical Exam BP 155/69  Pulse 53  Ht 5\' 8"  (1.727 m)  Wt 186 lb 1.6 oz (84.414  kg)  BMI 28.30 kg/m2  SpO2 100%  Gen.-alert and oriented x3 in no apparent distress HEENT normal for age Lungs no rhonchi or wheezing Cardiovascular regular rhythm no murmurs carotid pulses 3+ palpable no bruits audible Abdomen soft nontender no palpable masses Musculoskeletal free of  major deformities Skin clear -no rashes Neurologic normal Lower extremities 3+ femoral and dorsalis pedis pulses palpable bilaterally with no edema  Today I ordered a carotid duplex exam which are reviewed and interpreted. He does have a known left ICA occlusion. The right ICA has an approximate 60-70% stenosis which is stable. We have not previously done a study in our facility for comparison.        Assessment:     Previous left ICA occlusion with left brain CVA-now resolved Moderate right ICA stenosis-asymptomatic Status post bilateral common iliac stents by Dr. Donnella Bi    Plan:     Return in 12 months with followup carotid duplex exam in our office unless patient develops neurologic symptoms in the interim

## 2013-10-31 NOTE — Addendum Note (Signed)
Addended by: Mena Goes on: 10/31/2013 01:45 PM   Modules accepted: Orders

## 2013-11-06 ENCOUNTER — Ambulatory Visit (INDEPENDENT_AMBULATORY_CARE_PROVIDER_SITE_OTHER): Payer: Commercial Managed Care - HMO | Admitting: Cardiology

## 2013-11-06 ENCOUNTER — Encounter: Payer: Self-pay | Admitting: Cardiology

## 2013-11-06 VITALS — BP 150/60 | HR 56 | Ht 68.0 in | Wt 188.1 lb

## 2013-11-06 DIAGNOSIS — I2510 Atherosclerotic heart disease of native coronary artery without angina pectoris: Secondary | ICD-10-CM

## 2013-11-06 DIAGNOSIS — R0609 Other forms of dyspnea: Secondary | ICD-10-CM

## 2013-11-06 DIAGNOSIS — R0600 Dyspnea, unspecified: Secondary | ICD-10-CM

## 2013-11-06 DIAGNOSIS — R0989 Other specified symptoms and signs involving the circulatory and respiratory systems: Secondary | ICD-10-CM

## 2013-11-06 DIAGNOSIS — I10 Essential (primary) hypertension: Secondary | ICD-10-CM

## 2013-11-06 DIAGNOSIS — I739 Peripheral vascular disease, unspecified: Secondary | ICD-10-CM

## 2013-11-06 MED ORDER — PRASUGREL HCL 10 MG PO TABS: 10.0000 mg | ORAL_TABLET | Freq: Every day | ORAL | Status: DC

## 2013-11-06 NOTE — Assessment & Plan Note (Signed)
History of occluded left iliac and high-grade right common iliac artery also small AAA.  PTA and stenting of ostial right common iliac artery in June and in 10/12/2013 he underwent successful PTA and stenting of the left common iliac artery with chronic total occlusion current Dopplers revealed ABI on the right of 1.1 ABI on the left is 1.0
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
## 2013-11-06 NOTE — Assessment & Plan Note (Signed)
Dyspnea and fatigue related to Brilinta symptoms totally resolved on Plavix but P2Y12 was 303, Brilinta was resumed.  Discussed with 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  Patient continues to need dual antiplatelet therapy.  We will use Effient 10 mg daily.

## 2013-11-06 NOTE — Patient Instructions (Signed)
Take Effient 10 mg as directed  Your physician recommends that you schedule a follow-up appointment in: 4-6 weeks with [name]  We are giving you a written script for Effient and will call in a 90 day supply to [facility]

## 2013-11-06 NOTE — Assessment & Plan Note (Signed)
No chest pain no EKG changes

## 2013-11-09 ENCOUNTER — Other Ambulatory Visit: Payer: Self-pay | Admitting: *Deleted

## 2013-11-14 ENCOUNTER — Encounter: Payer: Self-pay | Admitting: *Deleted

## 2013-11-16 ENCOUNTER — Telehealth: Payer: Self-pay | Admitting: *Deleted

## 2013-11-16 NOTE — Telephone Encounter (Signed)
Ok times one 6 no ck up due

## 2013-11-16 NOTE — Telephone Encounter (Signed)
Request for refill from [facility]. Fax [phone] for tramadol 50mg . Last seen 05/01/13 for BP check.

## 2013-11-17 ENCOUNTER — Other Ambulatory Visit: Payer: Self-pay | Admitting: *Deleted

## 2013-11-17 MED ORDER — TRAMADOL HCL 50 MG PO TABS: 50.0000 mg | ORAL_TABLET | Freq: Three times a day (TID) | ORAL | Status: DC

## 2013-11-17 NOTE — Telephone Encounter (Signed)
Med faxed to pharm. Card mailed to pt to schedule office visit.

## 2013-11-28 ENCOUNTER — Encounter: Payer: Self-pay | Admitting: Family Medicine

## 2013-11-28 ENCOUNTER — Ambulatory Visit (INDEPENDENT_AMBULATORY_CARE_PROVIDER_SITE_OTHER): Payer: Medicare HMO | Admitting: Family Medicine

## 2013-11-28 VITALS — BP 150/88 | Ht 67.5 in | Wt 190.0 lb

## 2013-11-28 DIAGNOSIS — I10 Essential (primary) hypertension: Secondary | ICD-10-CM

## 2013-11-28 DIAGNOSIS — E785 Hyperlipidemia, unspecified: Secondary | ICD-10-CM

## 2013-11-28 DIAGNOSIS — J019 Acute sinusitis, unspecified: Secondary | ICD-10-CM

## 2013-11-28 DIAGNOSIS — Z23 Encounter for immunization: Secondary | ICD-10-CM

## 2013-11-28 DIAGNOSIS — N400 Enlarged prostate without lower urinary tract symptoms: Secondary | ICD-10-CM

## 2013-11-28 MED ORDER — TAMSULOSIN HCL 0.4 MG PO CAPS: 0.4000 mg | ORAL_CAPSULE | Freq: Every day | ORAL | Status: DC

## 2013-11-28 MED ORDER — CEFPROZIL 500 MG PO TABS: 500.0000 mg | ORAL_TABLET | Freq: Two times a day (BID) | ORAL | Status: DC

## 2013-11-28 NOTE — Progress Notes (Signed)
   Subjective:    Patient ID: [name] [name], male    DOB: 1934/11/28, 79 y.o.   MRN: [id number]  HPI Patient is here for follow up after having a heart attack. Patient is concerned with this cough he now has. Long discussion held regarding all the procedures he's had recently including peripheral vascular as well as heart. Long discussion held regarding his medications his fears and medicine. Also recent cough congestion drainage somewhat cold sinus pressure.   Review of Systems     Objective:   Physical Exam  Mild sinus tenderness throat normal neck supple lungs clear heart regular pulse normal      Assessment & Plan:  25 minutes was spent with this patient discussing his hyperlipidemia hypertension peripheral vascular disease and coronary artery disease. I am concerned that this patient does not take his medicines as directed because he has fears out of I tried to alleviate his fears he states he will try the cholesterol medicine again he agrees to trying 3 times per week. We'll check lipid liver profile in 3 months  He does have abnormal skin growth on the right side of his neck. I think it's very important for him to go ahead and get that removed but he is on medication that would cause bleeding we will need Dr. Naida Sleight approval.  Upper rest revealed a sinus infection antibiotics recommended pneumonia vaccine given

## 2013-11-29 ENCOUNTER — Encounter: Payer: Self-pay | Admitting: Family Medicine

## 2013-11-29 LAB — PSA: PSA: 0.72 ng/mL (ref <=4.00)

## 2013-12-14 ENCOUNTER — Telehealth: Payer: Self-pay | Admitting: Cardiovascular Disease

## 2013-12-14 DIAGNOSIS — L989 Disorder of the skin and subcutaneous tissue, unspecified: Secondary | ICD-10-CM

## 2013-12-14 NOTE — Telephone Encounter (Signed)
Please let the patient know that his cardiologist told us that he needs stay on this ( Effient)medicine for at least one year. We can still get him set up with dermatology but cannot come off of the medication. See if he has a preference regarding dermatology. May put in referral for him regarding skin lesions. Thank you

## 2013-12-14 NOTE — Telephone Encounter (Signed)
[name] [name] called in regards to the pt having a dermatologist appt to have skin lesions removed. The doctor noticed that [name] [name] was on Effient and wanted to know if it was safe for the pt to continue taking this medication while having this procedure. The appointment will not be scheduled until [name] gives his approval on what should be done. Please call  Thanks

## 2013-12-14 NOTE — Telephone Encounter (Signed)
Pt recently had a STEMI on plavix. He can NOT stop his anti platelet therapy for his dermatology procedure   JJB

## 2013-12-14 NOTE — Telephone Encounter (Signed)
Message forwarded to Dr. Sallee Lange. Per [name] Mr. Feggins can NOT stop the Effient - recent STEMI.

## 2013-12-14 NOTE — Telephone Encounter (Signed)
Dr.Scott Wolfgang Phoenix is referring to a Dermatologist to have skin lesions removed.  Dermatology will not schedule until they get a response as to whether his Effient can be stopped prior to his appt. [name] states they will probably do a shave biopsy.  Said to send a message directly into his In Basket.  Message sent to [name] for review and advise.

## 2013-12-15 NOTE — Telephone Encounter (Signed)
Discussed with patient. Patient would like to go ahead with referral and see Dr. Nevada Crane in [place]. Referral ordered in Epic.

## 2013-12-15 NOTE — Addendum Note (Signed)
Addended by: Dairl Ponder on: 12/15/2013 02:05 PM   Modules accepted: Orders

## 2013-12-15 NOTE — Telephone Encounter (Signed)
Left message to return call 

## 2013-12-26 ENCOUNTER — Ambulatory Visit (INDEPENDENT_AMBULATORY_CARE_PROVIDER_SITE_OTHER): Payer: Commercial Managed Care - HMO | Admitting: Cardiovascular Disease

## 2013-12-26 ENCOUNTER — Encounter: Payer: Self-pay | Admitting: Cardiovascular Disease

## 2013-12-26 VITALS — BP 148/68 | HR 61 | Ht 68.0 in | Wt 191.1 lb

## 2013-12-26 DIAGNOSIS — I714 Abdominal aortic aneurysm, without rupture, unspecified: Secondary | ICD-10-CM

## 2013-12-26 DIAGNOSIS — I2510 Atherosclerotic heart disease of native coronary artery without angina pectoris: Secondary | ICD-10-CM

## 2013-12-26 DIAGNOSIS — I739 Peripheral vascular disease, unspecified: Secondary | ICD-10-CM

## 2013-12-26 DIAGNOSIS — E785 Hyperlipidemia, unspecified: Secondary | ICD-10-CM

## 2013-12-26 DIAGNOSIS — I10 Essential (primary) hypertension: Secondary | ICD-10-CM

## 2013-12-26 NOTE — Assessment & Plan Note (Signed)
Will control her appropriate medications

## 2013-12-26 NOTE — Assessment & Plan Note (Addendum)
Patient has a small abdominal aortic aneurysm measuring 2.6 x 2.6 cm followed by duplex ultrasound

## 2013-12-26 NOTE — Assessment & Plan Note (Signed)
On statin therapy followed by his PCP 

## 2013-12-26 NOTE — Assessment & Plan Note (Signed)
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 His most recent Dopplers performed 10/27/13 revealed widely patent iliacs with normal ABIs. His claudication has resolved.

## 2013-12-26 NOTE — Assessment & Plan Note (Signed)
History of an anterior STEMI 4/ /5/15 tube with aspiration thrombectomy, PCI and stenting of the proximal LAD with low normal EF. He did not tolerate Brilenta and therefore is on Effient . He denies chest pain or shortness of breath.

## 2013-12-26 NOTE — Patient Instructions (Signed)
Your physician wants you to follow-up in: April 2016 with Dr Gwenlyn Found. You will receive a reminder letter in the mail two months in advance. If you don't receive a letter, please call our office to schedule the follow-up appointment.

## 2013-12-26 NOTE — Progress Notes (Signed)
12/26/2013 [name] [name]   1935/03/13  [id number]  Primary Physician Sallee Lange, MD Primary Cardiologist: Lorretta Harp MD [place], [name]   HPI:  [name] [name] is a 79 y.o. 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 He was admitted on 07/02/13 for Anterior STEMI. Presenting symptoms included severe epigastric burning pain/ indigestion and left arm pain. Initial troponin was > 20.0. He underwent emergent LHC and successful aspiration thrombectomy, PCI and stenting of proximal LAD by Dr. Gwenlyn [name], via the right femoral artery . The overall LVEF was estimated at 50%, without wall motion abnormalities. He tolerated the procedure well and left the cath lab in stable condition. DAPT with ASA and Brilinta was initiated. He was also placed on a BB, ACE-I and statin. His post PCI course was w/o complication. He was discharged home on 07/05/13.  It should also be noted that at time of emergent LHC, he was [name] to also have significant peripheral vascular disease with an occluded left iliac and high-grade right common iliac artery stenosis, as well as a small AAA. The patient complained of bilateral LEE. Dr. Gwenlyn [name] ordered for him to undergo bilateral LEAs. The study was performed in our office on 07/18/13. Findings are as follows: ABI 0.95 on Rt, 0.59 on Lt.the patient underwent angiography, diamondback orbital rotational arthrectomy, PTA and stenting of a highly calcified ostial right common iliac artery stenosis. He had excellent angiographic and clinical result. His Dopplers normalized to no longer has right lower extremity claudication. He wishes to proceed with attempt at percutaneous opacification of his left common iliac chronic total occlusion.  She did not tolerate Brilenta and therefore I changed him to Effient which he is tolerating. His followup Dopplers performed 10/27/13 revealed widely patent stents with normal  ABIs. He denies chest pain, shortness of breath or claudication.    Current Outpatient Prescriptions  Medication Sig Dispense Refill  . acetaminophen (TYLENOL) 500 MG tablet Take 1,000 mg by mouth 3 (three) times daily.      [name] amLODipine (NORVASC) 10 MG tablet Take 1 tablet (10 mg total) by mouth daily.  90 tablet  1  . aspirin 81 MG chewable tablet Chew 1 tablet (81 mg total) by mouth daily.      . cefPROZIL (CEFZIL) 500 MG tablet Take 1 tablet (500 mg total) by mouth 2 (two) times daily.  20 tablet  0  . lisinopril (PRINIVIL,ZESTRIL) 10 MG tablet Take 1 tablet (10 mg total) by mouth daily.  90 tablet  3  . methylcellulose (ARTIFICIAL TEARS) 1 % ophthalmic solution Place 1 drop into both eyes as needed (for dry eyes).      . metoprolol (LOPRESSOR) 50 MG tablet Take 50 mg by mouth 2 (two) times daily.      . nitroGLYCERIN (NITROSTAT) 0.4 MG SL tablet Place 1 tablet (0.4 mg total) under the tongue every 5 (five) minutes as needed for chest pain.  25 tablet  2  . prasugrel (EFFIENT) 10 MG TABS tablet Take 1 tablet (10 mg total) by mouth daily.  30 tablet  0  . rosuvastatin (CRESTOR) 10 MG tablet Take 1 tablet (10 mg total) by mouth daily at 6 PM.  90 tablet  3  . tamsulosin (FLOMAX) 0.4 MG CAPS capsule Take 1 capsule (0.4 mg total) by mouth daily.  90 capsule  3  . traMADol (ULTRAM) 50 MG tablet Take 1 tablet (50 mg total) by  mouth 3 (three) times daily.  90 tablet  5   No current facility-administered medications for this visit.    Allergies  Allergen Reactions  . Brilinta [Ticagrelor]     SOB  . Lipitor [Atorvastatin] Other (See Comments)    Muscle and joint pain    History   Social History  . Marital Status: Married    Spouse Name: N/A    Number of Children: N/A  . Years of Education: N/A   Occupational History  . Not on file.   Social History Main Topics  . Smoking status: Former Smoker -- 0.50 packs/day for 7 years    Types: Cigarettes  . Smokeless tobacco: Never Used       Comment: 09/04/2013 "quit smoking cigarettes ~ age [age]"  . Alcohol Use: No  . Drug Use: No  . Sexual Activity: Not Currently   Other Topics Concern  . Not on file   Social History Narrative  . No narrative on file     Review of Systems: General: negative for chills, fever, night sweats or weight changes.  Cardiovascular: negative for chest pain, dyspnea on exertion, edema, orthopnea, palpitations, paroxysmal nocturnal dyspnea or shortness of breath Dermatological: negative for rash Respiratory: negative for cough or wheezing Urologic: negative for hematuria Abdominal: negative for nausea, vomiting, diarrhea, bright red blood per rectum, melena, or hematemesis Neurologic: negative for visual changes, syncope, or dizziness All other systems reviewed and are otherwise negative except as noted above.    Blood pressure 148/68, pulse 61, height 5\' 8"  (1.727 m), weight 191 lb 1.6 oz (86.682 kg).  General appearance: alert and no distress Neck: no adenopathy, no JVD, supple, symmetrical, trachea midline, thyroid not enlarged, symmetric, no tenderness/mass/nodules and soft bilateral carotid bruits Lungs: clear to auscultation bilaterally Heart: regular rate and rhythm, S1, S2 normal, no murmur, click, rub or gallop Extremities: extremities normal, atraumatic, no cyanosis or edema  EKG not performed today  ASSESSMENT AND PLAN:   Hyperlipidemia On statin therapy followed by his PCP  Essential hypertension, benign Will control her appropriate medications  AAA (abdominal aortic aneurysm) Patient has a small abdominal aortic aneurysm measuring 2.6 x 2.6 cm followed by duplex ultrasound  Claudication 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 His most recent Dopplers performed 10/27/13 revealed widely patent iliacs with normal ABIs. His claudication has resolved.  CAD s/p Anterior STEMI 07/02/13; s/p PCI + DES to proximal LAD. EF 50% History of an anterior  STEMI 4/ /5/15 tube with aspiration thrombectomy, PCI and stenting of the proximal LAD with low normal EF. He did not tolerate Brilenta and therefore is on Effient . He denies chest pain or shortness of breath.      Lorretta Harp MD FACP,FACC,FAHA, [facility] 12/26/2013 2:30 PM

## 2014-01-04 ENCOUNTER — Other Ambulatory Visit: Payer: Self-pay | Admitting: Family Medicine

## 2014-01-04 DIAGNOSIS — L989 Disorder of the skin and subcutaneous tissue, unspecified: Secondary | ICD-10-CM

## 2014-01-18 ENCOUNTER — Other Ambulatory Visit: Payer: Self-pay | Admitting: Family Medicine

## 2014-02-27 ENCOUNTER — Ambulatory Visit (INDEPENDENT_AMBULATORY_CARE_PROVIDER_SITE_OTHER): Payer: Commercial Managed Care - HMO | Admitting: Family Medicine

## 2014-02-27 ENCOUNTER — Encounter: Payer: Self-pay | Admitting: Family Medicine

## 2014-02-27 VITALS — BP 132/78 | Ht 68.0 in | Wt 187.0 lb

## 2014-02-27 DIAGNOSIS — I10 Essential (primary) hypertension: Secondary | ICD-10-CM

## 2014-02-27 DIAGNOSIS — E785 Hyperlipidemia, unspecified: Secondary | ICD-10-CM

## 2014-02-27 DIAGNOSIS — Z79899 Other long term (current) drug therapy: Secondary | ICD-10-CM

## 2014-02-27 MED ORDER — AMLODIPINE BESYLATE 10 MG PO TABS: 10.0000 mg | ORAL_TABLET | Freq: Every day | ORAL | Status: DC

## 2014-02-27 NOTE — Progress Notes (Signed)
   Subjective:    Patient ID: [name] [name], male    DOB: 10-20-1934, 79 y.o.   MRN: [id number]  Hypertension This is a chronic problem. The current episode started more than 1 year ago. Pertinent negatives include no chest pain.  Arms itching. Started about 1 month ago.  Declines flu vaccine.    Review of Systems  Constitutional: Negative for activity change, appetite change and fatigue.  HENT: Negative for congestion.   Respiratory: Negative for cough.   Cardiovascular: Negative for chest pain.  Gastrointestinal: Negative for abdominal pain.  Endocrine: Negative for polydipsia and polyphagia.  Neurological: Negative for weakness.  Psychiatric/Behavioral: Negative for confusion.       Objective:   Physical Exam  Constitutional: He appears well-nourished. No distress.  Cardiovascular: Normal rate, regular rhythm and normal heart sounds.   No murmur heard. Pulmonary/Chest: Effort normal and breath sounds normal. No respiratory distress.  Musculoskeletal: He exhibits no edema.  Lymphadenopathy:    He has no cervical adenopathy.  Neurological: He is alert.  Psychiatric: His behavior is normal.  Vitals reviewed.         Assessment & Plan:  HTN decent control continue current measures. Patient not having any signs of failure or angina. Continue low-salt diet regular physical activity patient bowling son  Probable dry skin use lotion  Patient defers flu vaccine

## 2014-02-28 ENCOUNTER — Encounter: Payer: Self-pay | Admitting: Family Medicine

## 2014-02-28 LAB — LIPID PANEL
Cholesterol: 137 mg/dL (ref 0–200)
HDL: 33 mg/dL — ABNORMAL LOW (ref >39)
LDL CALC: 80 mg/dL (ref 0–99)
Total CHOL/HDL Ratio: 4.2 Ratio
Triglycerides: 121 mg/dL (ref <150)
VLDL: 24 mg/dL (ref 0–40)

## 2014-02-28 LAB — HEPATIC FUNCTION PANEL
ALK PHOS: 50 U/L (ref 39–117)
ALT: 8 U/L (ref 0–53)
AST: 12 U/L (ref 0–37)
Albumin: 4.2 g/dL (ref 3.5–5.2)
BILIRUBIN DIRECT: 0.1 mg/dL (ref 0.0–0.3)
BILIRUBIN INDIRECT: 0.3 mg/dL (ref 0.2–1.2)
BILIRUBIN TOTAL: 0.4 mg/dL (ref 0.2–1.2)
Total Protein: 6.3 g/dL (ref 6.0–8.3)

## 2014-03-08 ENCOUNTER — Encounter (HOSPITAL_COMMUNITY): Payer: Self-pay | Admitting: Cardiovascular Disease

## 2014-03-26 ENCOUNTER — Emergency Department (HOSPITAL_COMMUNITY): Payer: Commercial Managed Care - HMO

## 2014-03-26 ENCOUNTER — Ambulatory Visit (INDEPENDENT_AMBULATORY_CARE_PROVIDER_SITE_OTHER): Payer: Commercial Managed Care - HMO | Admitting: Family Medicine

## 2014-03-26 ENCOUNTER — Encounter: Payer: Self-pay | Admitting: Family Medicine

## 2014-03-26 ENCOUNTER — Inpatient Hospital Stay (HOSPITAL_COMMUNITY): Payer: Commercial Managed Care - HMO

## 2014-03-26 ENCOUNTER — Inpatient Hospital Stay (HOSPITAL_COMMUNITY)
Admission: EM | Admit: 2014-03-26 | Discharge: 2014-03-29 | DRG: 066 | Disposition: A | Discharge status: Home / Self Care | Payer: Commercial Managed Care - HMO | Attending: Internal Medicine | Admitting: Internal Medicine

## 2014-03-26 ENCOUNTER — Encounter (HOSPITAL_COMMUNITY): Payer: Self-pay | Admitting: Emergency Medicine

## 2014-03-26 VITALS — BP 178/80 | Temp 98.1°F | Ht 68.0 in | Wt 186.0 lb

## 2014-03-26 DIAGNOSIS — M1990 Unspecified osteoarthritis, unspecified site: Secondary | ICD-10-CM | POA: Diagnosis present

## 2014-03-26 DIAGNOSIS — I2510 Atherosclerotic heart disease of native coronary artery without angina pectoris: Secondary | ICD-10-CM | POA: Diagnosis present

## 2014-03-26 DIAGNOSIS — I252 Old myocardial infarction: Secondary | ICD-10-CM | POA: Diagnosis not present

## 2014-03-26 DIAGNOSIS — Z87891 Personal history of nicotine dependence: Secondary | ICD-10-CM

## 2014-03-26 DIAGNOSIS — E785 Hyperlipidemia, unspecified: Secondary | ICD-10-CM | POA: Diagnosis present

## 2014-03-26 DIAGNOSIS — Z9842 Cataract extraction status, left eye: Secondary | ICD-10-CM | POA: Diagnosis not present

## 2014-03-26 DIAGNOSIS — Z7982 Long term (current) use of aspirin: Secondary | ICD-10-CM

## 2014-03-26 DIAGNOSIS — Z888 Allergy status to other drugs, medicaments and biological substances status: Secondary | ICD-10-CM

## 2014-03-26 DIAGNOSIS — I739 Peripheral vascular disease, unspecified: Secondary | ICD-10-CM | POA: Diagnosis present

## 2014-03-26 DIAGNOSIS — I6523 Occlusion and stenosis of bilateral carotid arteries: Secondary | ICD-10-CM | POA: Diagnosis present

## 2014-03-26 DIAGNOSIS — Z9861 Coronary angioplasty status: Secondary | ICD-10-CM

## 2014-03-26 DIAGNOSIS — Z8673 Personal history of transient ischemic attack (TIA), and cerebral infarction without residual deficits: Secondary | ICD-10-CM | POA: Diagnosis not present

## 2014-03-26 DIAGNOSIS — I639 Cerebral infarction, unspecified: Secondary | ICD-10-CM | POA: Diagnosis not present

## 2014-03-26 DIAGNOSIS — Z9841 Cataract extraction status, right eye: Secondary | ICD-10-CM | POA: Diagnosis not present

## 2014-03-26 DIAGNOSIS — Z9119 Patient's noncompliance with other medical treatment and regimen: Secondary | ICD-10-CM | POA: Diagnosis present

## 2014-03-26 DIAGNOSIS — R531 Weakness: Secondary | ICD-10-CM | POA: Diagnosis not present

## 2014-03-26 DIAGNOSIS — I714 Abdominal aortic aneurysm, without rupture, unspecified: Secondary | ICD-10-CM | POA: Diagnosis present

## 2014-03-26 DIAGNOSIS — I10 Essential (primary) hypertension: Secondary | ICD-10-CM | POA: Diagnosis present

## 2014-03-26 DIAGNOSIS — Z961 Presence of intraocular lens: Secondary | ICD-10-CM | POA: Diagnosis present

## 2014-03-26 DIAGNOSIS — D696 Thrombocytopenia, unspecified: Secondary | ICD-10-CM | POA: Diagnosis present

## 2014-03-26 DIAGNOSIS — G458 Other transient cerebral ischemic attacks and related syndromes: Secondary | ICD-10-CM

## 2014-03-26 DIAGNOSIS — Z955 Presence of coronary angioplasty implant and graft: Secondary | ICD-10-CM

## 2014-03-26 DIAGNOSIS — L259 Unspecified contact dermatitis, unspecified cause: Secondary | ICD-10-CM

## 2014-03-26 LAB — CBC
HCT: 34.7 % — ABNORMAL LOW (ref 39.0–52.0)
Hemoglobin: 10.4 g/dL — ABNORMAL LOW (ref 13.0–17.0)
MCH: 21.7 pg — AB (ref 26.0–34.0)
MCHC: 30 g/dL (ref 30.0–36.0)
MCV: 72.4 fL — ABNORMAL LOW (ref 78.0–100.0)
PLATELETS: 176 10*3/uL (ref 150–400)
RBC: 4.79 MIL/uL (ref 4.22–5.81)
RDW: 16.9 % — ABNORMAL HIGH (ref 11.5–15.5)
WBC: 8 10*3/uL (ref 4.0–10.5)

## 2014-03-26 LAB — COMPREHENSIVE METABOLIC PANEL
ALBUMIN: 3.9 g/dL (ref 3.5–5.2)
ALT: 13 U/L (ref 0–53)
AST: 15 U/L (ref 0–37)
Alkaline Phosphatase: 59 U/L (ref 39–117)
Anion gap: 6 (ref 5–15)
BUN: 20 mg/dL (ref 6–23)
CALCIUM: 8.5 mg/dL (ref 8.4–10.5)
CO2: 25 mmol/L (ref 19–32)
CREATININE: 0.89 mg/dL (ref 0.50–1.35)
Chloride: 109 mEq/L (ref 96–112)
GFR calc Af Amer: >90 mL/min (ref >90)
GFR calc non Af Amer: 80 mL/min — ABNORMAL LOW (ref >90)
Glucose, Bld: 94 mg/dL (ref 70–99)
Potassium: 4 mmol/L (ref 3.5–5.1)
SODIUM: 140 mmol/L (ref 135–145)
Total Bilirubin: 0.2 mg/dL — ABNORMAL LOW (ref 0.3–1.2)
Total Protein: 6.5 g/dL (ref 6.0–8.3)

## 2014-03-26 LAB — DIFFERENTIAL
Basophils Absolute: 0.1 10*3/uL (ref 0.0–0.1)
Basophils Relative: 1 % (ref 0–1)
Eosinophils Absolute: 0.5 10*3/uL (ref 0.0–0.7)
Eosinophils Relative: 6 % — ABNORMAL HIGH (ref 0–5)
Lymphocytes Relative: 17 % (ref 12–46)
Lymphs Abs: 1.4 10*3/uL (ref 0.7–4.0)
MONO ABS: 0.8 10*3/uL (ref 0.1–1.0)
MONOS PCT: 10 % (ref 3–12)
NEUTROS PCT: 66 % (ref 43–77)
Neutro Abs: 5.2 10*3/uL (ref 1.7–7.7)

## 2014-03-26 LAB — APTT: aPTT: 25 seconds (ref 24–37)

## 2014-03-26 LAB — PROTIME-INR
INR: 1.06 (ref 0.00–1.49)
PROTHROMBIN TIME: 13.9 s (ref 11.6–15.2)

## 2014-03-26 LAB — TROPONIN I

## 2014-03-26 LAB — ETHANOL: Alcohol, Ethyl (B): <5 mg/dL (ref 0–9)

## 2014-03-26 MED ORDER — AMLODIPINE BESYLATE 5 MG PO TABS
10.0000 mg | ORAL_TABLET | Freq: Every day | ORAL | Status: DC
  Administered 2014-03-28 – 2014-03-29 (×2): 10 mg via ORAL
  Filled 2014-03-26 (×2): qty 2

## 2014-03-26 MED ORDER — TRAMADOL HCL 50 MG PO TABS
50.0000 mg | ORAL_TABLET | Freq: Three times a day (TID) | ORAL | Status: DC
  Administered 2014-03-26 – 2014-03-29 (×8): 50 mg via ORAL
  Filled 2014-03-26 (×8): qty 1

## 2014-03-26 MED ORDER — METOPROLOL TARTRATE 50 MG PO TABS
50.0000 mg | ORAL_TABLET | Freq: Two times a day (BID) | ORAL | Status: DC
  Administered 2014-03-26 – 2014-03-29 (×6): 50 mg via ORAL
  Filled 2014-03-26 (×6): qty 1

## 2014-03-26 MED ORDER — HYPROMELLOSE (GONIOSCOPIC) 2.5 % OP SOLN
1.0000 [drp] | OPHTHALMIC | Status: DC | PRN
  Filled 2014-03-26: qty 15

## 2014-03-26 MED ORDER — HEPARIN SODIUM (PORCINE) 5000 UNIT/ML IJ SOLN
5000.0000 [IU] | Freq: Three times a day (TID) | INTRAMUSCULAR | Status: DC
  Administered 2014-03-26 – 2014-03-28 (×6): 5000 [IU] via SUBCUTANEOUS
  Filled 2014-03-26 (×6): qty 1

## 2014-03-26 MED ORDER — ALOE PO LIQD: 1.0000 "application " | Freq: Every day | ORAL | Status: DC | PRN

## 2014-03-26 MED ORDER — MOMETASONE FUROATE 0.1 % EX CREA
1.0000 "application " | TOPICAL_CREAM | Freq: Every day | CUTANEOUS | Status: DC
  Administered 2014-03-27 – 2014-03-29 (×3): 1 via TOPICAL
  Filled 2014-03-26: qty 15

## 2014-03-26 MED ORDER — ASPIRIN 325 MG PO TABS
325.0000 mg | ORAL_TABLET | Freq: Every day | ORAL | Status: DC
  Administered 2014-03-27 – 2014-03-29 (×4): 325 mg via ORAL
  Filled 2014-03-26 (×4): qty 1

## 2014-03-26 MED ORDER — STROKE: EARLY STAGES OF RECOVERY BOOK
Freq: Once | Status: DC
  Filled 2014-03-26: qty 1

## 2014-03-26 MED ORDER — LISINOPRIL 10 MG PO TABS
10.0000 mg | ORAL_TABLET | Freq: Every day | ORAL | Status: DC
  Administered 2014-03-28 – 2014-03-29 (×2): 10 mg via ORAL
  Filled 2014-03-26 (×2): qty 1

## 2014-03-26 MED ORDER — MOMETASONE FUROATE 0.1 % EX CREA: TOPICAL_CREAM | CUTANEOUS | Status: DC

## 2014-03-26 MED ORDER — PRASUGREL HCL 10 MG PO TABS
10.0000 mg | ORAL_TABLET | Freq: Every day | ORAL | Status: DC
Start: 2014-03-26 — End: 2014-03-26

## 2014-03-26 MED ORDER — SENNOSIDES-DOCUSATE SODIUM 8.6-50 MG PO TABS
1.0000 | ORAL_TABLET | Freq: Every evening | ORAL | Status: DC | PRN
  Filled 2014-03-26: qty 1

## 2014-03-26 MED ORDER — ACETAMINOPHEN 500 MG PO TABS
500.0000 mg | ORAL_TABLET | Freq: Four times a day (QID) | ORAL | Status: DC | PRN
  Administered 2014-03-26: 500 mg via ORAL
  Filled 2014-03-26: qty 1

## 2014-03-26 MED ORDER — NITROGLYCERIN 0.4 MG SL SUBL: 0.4000 mg | SUBLINGUAL_TABLET | SUBLINGUAL | Status: DC | PRN
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
## 2014-03-26 NOTE — ED Notes (Signed)
Pt went to Dr Jesusita Oka office today for rash on arms. Dr leuking sent pt over here for possible stroke since pt had developed weakness on rt side and was unable to walk. Pt states he is having numbness in both hands and that both his right arm and rt leg feel "heavy" and like he is "losing use of it".
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
## 2014-03-26 NOTE — Progress Notes (Signed)
   Subjective:    Patient ID: [name] [name], male    DOB: November 29, 1934, 79 y.o.   MRN: [id number]  HPIitchy rash on both arms. Started about 1 week ago. Has tried cortisone, itch cream, cerva cream, lotion.  Patient denies any exposure to anything outside although he does stay active outside as well as inside the house.  He denies any new medications. Denies any new detergents or lotions.  Right leg and right arm numbness. Started yesterday. Today best of his knowledge it started yesterday he is noticed increased today weakness on the right side difficulty with walking states that the leg seems to be dragging relates numbness in his arm. He downplays his symptoms. His wife states that there is something definitely going on that she thinks started today.  He does have a history of heart disease, vascular, hypertension, hyperlipidemia, history of stroke      Review of Systems See above.    Objective:   Physical Exam Erythematous rash on both forearms. No blisters. No sinus cellulitis.  Lungs are clear heart regular blood pressure elevated extremities trace edema subjective weakness noted in the right arm right leg on testing patient strength is slightly lower than the left side.       Assessment & Plan:  Probable contact dermatitis I would recommend a steroid cream, this will be sent in to his pharmacy.  More concerning is the possibility of a evolving stroke. This patient has had some problems with poor control of risk factors. He also has a history of stroke. I spoke with the ER doctor. The patient was assisted over to the emergency department without any problems.

## 2014-03-27 ENCOUNTER — Inpatient Hospital Stay (HOSPITAL_COMMUNITY): Payer: Commercial Managed Care - HMO

## 2014-03-27 ENCOUNTER — Encounter (HOSPITAL_COMMUNITY): Payer: Self-pay | Admitting: Adult Health

## 2014-03-27 DIAGNOSIS — I63019 Cerebral infarction due to thrombosis of unspecified vertebral artery: Secondary | ICD-10-CM

## 2014-03-27 DIAGNOSIS — I369 Nonrheumatic tricuspid valve disorder, unspecified: Secondary | ICD-10-CM

## 2014-03-27 LAB — LIPID PANEL
CHOLESTEROL: 134 mg/dL (ref 0–200)
HDL: 24 mg/dL — ABNORMAL LOW (ref >39)
LDL Cholesterol: 71 mg/dL (ref 0–99)
Total CHOL/HDL Ratio: 5.6 RATIO
Triglycerides: 194 mg/dL — ABNORMAL HIGH (ref <150)
VLDL: 39 mg/dL (ref 0–40)

## 2014-03-27 LAB — MRSA PCR SCREENING: MRSA BY PCR: NEGATIVE

## 2014-03-27 LAB — HEMOGLOBIN A1C
Hgb A1c MFr Bld: 5.9 % — ABNORMAL HIGH (ref <5.7)
Mean Plasma Glucose: 123 mg/dL — ABNORMAL HIGH (ref <117)

## 2014-03-27 MED ORDER — POLYVINYL ALCOHOL 1.4 % OP SOLN
1.0000 [drp] | OPHTHALMIC | Status: DC | PRN
  Filled 2014-03-27: qty 15

## 2014-03-27 MED ORDER — STROKE: EARLY STAGES OF RECOVERY BOOK
Freq: Once | Status: AC
  Administered 2014-03-27: 12:00:00
  Filled 2014-03-27: qty 1
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
## 2014-03-27 NOTE — Care Management Utilization Note (Signed)
UR complete 

## 2014-03-27 NOTE — Progress Notes (Signed)
Pt arrived to unit, admission assessment completed, pt appears to be resting comfortably, vss, no c/o pain, not defecits present from cva, low bed, HOB self regulated, call bell at pt's side, will cont to montior

## 2014-03-27 NOTE — Progress Notes (Addendum)
[facility] PROGRESS NOTE  [name] [name] WYO:[id number] DOB: 04/24/1934 DOA: 03/26/2014 PCP: Sallee Lange, MD  Assessment/Plan: 1. Acute L frontal CVA 1. 2d echo pending 2. B carotid US pending 3. Neurology consulted, will appreciate further recs 4. For the interim, the pt is continued on asa with effient held until clear by Neurology or if an alternative antiplatelet is recommended 5. Of note, pt's antiplatelet had been managed by his Cardiologist, [name] who reports intolerance to Brilinta and thus pt had been on Effient. 2. HTN 1. BP is stable, allowing for permissive HTN given acute CVA 3. CAD 1. Stable 2. No chest pain or sob 4. PVD 1. On ASA for now, effient held given acute CVA 2. Per above, cont to hold until clear by Neurology or if alternative tx is recommended 3. Pt is s/p stenting of L iliac in 7/15, on ASA and Effient prior to admit 4. Cardiology consult was placed for input regarding Effient use in this particular case, however pt has refused to speak to our covering Cardiologist and instead wants to discuss with [name] 5. Will f/u with Neurology input per above 5. DVT prophylaxis 1. Heparin subQ  Code Status: Full Family Communication: Pt in room Disposition Plan: Pending   Consultants:  Neurology  Procedures:    Antibiotics:   (indicate start date, and stop date if known)  HPI/Subjective: No complaints. Reports feeling well  Objective: Filed Vitals:   03/27/14 0800 03/27/14 0822 03/27/14 0927 03/27/14 1044  BP: 141/52  183/61 166/61  Pulse:   71 63  Temp:  97.5 F (36.4 C)  97.8 F (36.6 C)  TempSrc:  Oral  Oral  Resp: 15   20  Height:      Weight:      SpO2:    99%    Intake/Output Summary (Last 24 hours) at 03/27/14 1355 Last data filed at 03/27/14 0845  Gross per 24 hour  Intake    240 ml  Output    875 ml  Net   -635 ml   Filed Weights   03/26/14 1743 03/26/14 2317  Weight: 84.369 kg (186 lb) 82.3 kg (181 lb 7  oz)    Exam:   General:  Awake, in nad  Cardiovascular: regular, s1,s2  Respiratory: normal resp effort, no wheezing  Abdomen: soft,nondistended, obese  Musculoskeletal: perfused, no clubbing   Data Reviewed: Basic Metabolic Panel:  Recent Labs Lab 03/26/14 1821  NA 140  K 4.0  CL 109  CO2 25  GLUCOSE 94  BUN 20  CREATININE 0.89  CALCIUM 8.5   Liver Function Tests:  Recent Labs Lab 03/26/14 1821  AST 15  ALT 13  ALKPHOS 59  BILITOT 0.2*  PROT 6.5  ALBUMIN 3.9   No results for input(s): LIPASE, AMYLASE in the last 168 hours. No results for input(s): AMMONIA in the last 168 hours. CBC:  Recent Labs Lab 03/26/14 1821  WBC 8.0  NEUTROABS 5.2  HGB 10.4*  HCT 34.7*  MCV 72.4*  PLT 176   Cardiac Enzymes:  Recent Labs Lab 03/26/14 1938  TROPONINI <0.03   BNP (last 3 results) No results for input(s): PROBNP in the last 8760 hours. CBG: No results for input(s): GLUCAP in the last 168 hours.  Recent Results (from the past 240 hour(s))  MRSA PCR Screening     Status: None   Collection Time: 03/26/14 11:15 PM  Result Value Ref Range Status   MRSA by PCR NEGATIVE NEGATIVE Final  Comment:        The GeneXpert MRSA Assay (FDA approved for NASAL specimens only), is one component of a comprehensive MRSA colonization surveillance program. It is not intended to diagnose MRSA infection nor to guide or monitor treatment for MRSA infections.      Studies: Dg Chest 2 View  03/26/2014   CLINICAL DATA:  Right-sided weakness, rash  EXAM: CHEST  2 VIEW  COMPARISON:  None.  FINDINGS: Lungs are clear.  No pleural effusion or pneumothorax.  The heart is normal in size.  Mild degenerative changes of the visualized thoracolumbar spine.  Degenerative changes of the bilateral shoulders.  IMPRESSION: No evidence of acute cardiopulmonary disease.   Electronically Signed   By: Julian Hy M.D.   On: 03/26/2014 22:21   Ct Head Wo Contrast  03/26/2014    CLINICAL DATA:  Right-sided weakness, bilateral hand numbness, difficulty walking  EXAM: CT HEAD WITHOUT CONTRAST  TECHNIQUE: Contiguous axial images were obtained from the base of the skull through the vertex without intravenous contrast.  COMPARISON:  12/20/2010  FINDINGS: No evidence of parenchymal hemorrhage or extra-axial fluid collection. No mass lesion, mass effect, or midline shift.  No CT evidence of acute infarction.  Subcortical white matter and periventricular small vessel ischemic changes. Intracranial atherosclerosis.  Mild age related atrophy.  No ventriculomegaly.  Benign-appearing subcutaneous lesion overlying the left frontal bone.  The visualized paranasal sinuses are essentially clear. The mastoid air cells are unopacified.  No evidence of calvarial fracture.  IMPRESSION: No evidence of acute intracranial abnormality.  Atrophy with small vessel ischemic changes and intracranial atherosclerosis.   Electronically Signed   By: Julian Hy M.D.   On: 03/26/2014 19:11   Mr Angiogram Head Wo Contrast  03/26/2014   CLINICAL DATA:  Right-sided weakness.  Stroke.  EXAM: MRI HEAD WITHOUT CONTRAST  MRA HEAD WITHOUT CONTRAST  TECHNIQUE: Multiplanar, multiecho pulse sequences of the brain and surrounding structures were obtained without intravenous contrast. Angiographic images of the head were obtained using MRA technique without contrast.  COMPARISON:  CT 03/26/2014  FINDINGS: MRI HEAD FINDINGS  Acute infarct left frontal parietal lobe. Acute infarct in the precentral gyrus on the left. Small chronic infarcts in the left posterior temporal lobe measuring under per 5 mm. Small chronic infarct in the left anterior frontal lobe.  Mild chronic microvascular ischemic changes in the white matter bilaterally.  Negative for hemorrhage or mass lesion.  Occlusion of the left internal carotid artery.  Paranasal sinuses clear.  MRA HEAD FINDINGS  Both vertebral arteries are patent. PICA patent. Basilar  patent. Left posterior cerebral artery widely patent. Decreased signal in the distal right posterior cerebral artery which may be due to stenosis or occlusion.  Right internal carotid artery is patent with mild atherosclerotic disease in the cavernous segment. Right anterior and middle cerebral arteries are patent bilaterally.  Left internal carotid artery is occluded. There is reconstitution of the supra clinoid internal carotid artery due to collaterals. Left anterior and middle cerebral arteries have decreased signal but are patent. Left M1 segment not well visualized due to low flow.  Negative for cerebral aneurysm.  IMPRESSION: Acute infarct in the left frontal parietal cortex primarily the precentral cortex. Small areas of acute infarct in the left frontal lobe and left posterior temporal lobe.  Mild chronic microvascular ischemia.  Occlusion of the left internal carotid artery. Left anterior and middle cerebral arteries are supplied via collateral flow. Decreased signal in the right posterior cerebral artery which  may be due to stenosis or occlusion.   Electronically Signed   By: Franchot Gallo M.D.   On: 03/26/2014 20:57   Mr Brain Wo Contrast  03/26/2014   CLINICAL DATA:  Right-sided weakness.  Stroke.  EXAM: MRI HEAD WITHOUT CONTRAST  MRA HEAD WITHOUT CONTRAST  TECHNIQUE: Multiplanar, multiecho pulse sequences of the brain and surrounding structures were obtained without intravenous contrast. Angiographic images of the head were obtained using MRA technique without contrast.  COMPARISON:  CT 03/26/2014  FINDINGS: MRI HEAD FINDINGS  Acute infarct left frontal parietal lobe. Acute infarct in the precentral gyrus on the left. Small chronic infarcts in the left posterior temporal lobe measuring under per 5 mm. Small chronic infarct in the left anterior frontal lobe.  Mild chronic microvascular ischemic changes in the white matter bilaterally.  Negative for hemorrhage or mass lesion.  Occlusion of the left  internal carotid artery.  Paranasal sinuses clear.  MRA HEAD FINDINGS  Both vertebral arteries are patent. PICA patent. Basilar patent. Left posterior cerebral artery widely patent. Decreased signal in the distal right posterior cerebral artery which may be due to stenosis or occlusion.  Right internal carotid artery is patent with mild atherosclerotic disease in the cavernous segment. Right anterior and middle cerebral arteries are patent bilaterally.  Left internal carotid artery is occluded. There is reconstitution of the supra clinoid internal carotid artery due to collaterals. Left anterior and middle cerebral arteries have decreased signal but are patent. Left M1 segment not well visualized due to low flow.  Negative for cerebral aneurysm.  IMPRESSION: Acute infarct in the left frontal parietal cortex primarily the precentral cortex. Small areas of acute infarct in the left frontal lobe and left posterior temporal lobe.  Mild chronic microvascular ischemia.  Occlusion of the left internal carotid artery. Left anterior and middle cerebral arteries are supplied via collateral flow. Decreased signal in the right posterior cerebral artery which may be due to stenosis or occlusion.   Electronically Signed   By: Franchot Gallo M.D.   On: 03/26/2014 20:57    Scheduled Meds: .  stroke: mapping our early stages of recovery book   Does not apply Once  . amLODipine  10 mg Oral Daily  . aspirin  325 mg Oral Daily  . heparin  5,000 Units Subcutaneous 3 times per day  . lisinopril  10 mg Oral Daily  . metoprolol  50 mg Oral BID  . mometasone  1 application Topical Daily  . traMADol  50 mg Oral TID   Continuous Infusions:   Active Problems:   Essential hypertension, benign   History of stroke   Peripheral vascular disease   CAD s/p Anterior STEMI 07/02/13; s/p PCI + DES to proximal LAD. EF 50%   AAA (abdominal aortic aneurysm)   CVA (cerebral infarction)   Stroke  Time spent: 36min  [facility], [facility] Pager [phone]. If 7PM-7AM, please contact night-coverage at [email or web address], password [facility] 03/27/2014, 1:55 PM  LOS: 1 day

## 2014-03-27 NOTE — Evaluation (Signed)
Physical Therapy Evaluation Patient Details Name: [name] [name] MRN: [id number] DOB: 1934-10-20 Today's Date: 03/27/2014   History of Present Illness  This 79 year old man went to his primary care physician for a skin rash but he was noted to have weakness in the right leg. The weakness apparently started today at approximately 4 PM. However, prior to this he had had weakness in the right arm which started approximately 2 days ago. He denies any speech or vision problems. There is been no headache, loss of consciousness. 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 He also does have history of coronary artery disease and peripheral vascular disease. Initial evaluation in the emergency room with MRI brain scan shows him to have an acute CVA in the left frontal parietal lobe. He is being admitted for further management.  Pt lives with his wife and he is independent with ADLs using no assistive device.  Clinical Impression   Pt was seen for evaluation.  His initial right sided weakness appears to have resolved.  His LE strength is equal and 5/5 bilaterally, his coordination and balance are WNL.  He ambulates with a stable gait and no assistive device.  No further PT should be needed.    Follow Up Recommendations No PT follow up    Equipment Recommendations  None recommended by PT    Recommendations for Other Services   none    Precautions / Restrictions Precautions Precautions: None Restrictions Weight Bearing Restrictions: No      Mobility  Bed Mobility Overal bed mobility: Independent                Transfers Overall transfer level: Independent Equipment used: None Transfers: Sit to/from Stand Sit to Stand: Independent            Ambulation/Gait Ambulation/Gait assistance: Modified independent (Device/Increase time) Ambulation Distance (Feet): 350  Feet Assistive device: None Gait Pattern/deviations: WFL(Within Functional Limits)   Gait velocity interpretation: at or above normal speed for age/gender General Gait Details: no gait instability  Stairs            Wheelchair Mobility    Modified Rankin (Stroke Patients Only)       Balance   Sitting-balance support: No upper extremity supported;Feet supported Sitting balance-Leahy Scale: Normal     Standing balance support: No upper extremity supported Standing balance-Leahy Scale: Good               High level balance activites: Turns;Direction changes;Backward walking;Sudden stops;Head turns High Level Balance Comments: no loss of balance with above activites             Pertinent Vitals/Pain Pain Assessment: No/denies pain    Home Living Family/patient expects to be discharged to:: Private residence Living Arrangements: Spouse/significant other Available Help at Discharge: Family Type of Home: House Home Access: Stairs to enter Entrance Stairs-Rails: Right Entrance Stairs-Number of Steps: 4 Home Layout: One level Home Equipment: Cane - quad;Crutches;Shower seat - built in;Grab bars - tub/shower      Prior Function Level of Independence: Independent         Comments: pt did not report any falls to Therapist, family present and they did not report any falls     Hand Dominance   Dominant Hand: Right    Extremity/Trunk Assessment               Lower Extremity  Assessment: Overall WFL for tasks assessed (coordination is WNL)         Communication   Communication: HOH  Cognition Arousal/Alertness: Awake/alert Behavior During Therapy: WFL for tasks assessed/performed Overall Cognitive Status: Within Functional Limits for tasks assessed                      General Comments      Exercises        Assessment/Plan    PT Assessment Patent does not need any further PT services  PT Diagnosis     PT Problem List     PT Treatment Interventions     PT Goals (Current goals can be found in the Care Plan section) Acute Rehab PT Goals PT Goal Formulation: All assessment and education complete, DC therapy    Frequency     Barriers to discharge  none      Co-evaluation               End of Session Equipment Utilized During Treatment: Gait belt Activity Tolerance: Patient tolerated treatment well Patient left: in bed;with call bell/phone within reach           Time: 6553-7482 PT Time Calculation (min) (ACUTE ONLY): 24 min   Charges:   PT Evaluation $Initial PT Evaluation Tier I: 1 Procedure     PT G CodesDemetrios Isaacs L 03/27/2014, 4:57 PM

## 2014-03-27 NOTE — Consult Note (Signed)
ERROR

## 2014-03-27 NOTE — Progress Notes (Signed)
  Echocardiogram 2D Echocardiogram has been performed.  [place], [place] 03/27/2014, 2:16 PM

## 2014-03-27 NOTE — Progress Notes (Signed)
Per RN report, pt came up from ED without IV access and there were several attempts made to gain access. Pt states he is a hard stick and doesn't wish to be stuck again unless absolutely necessary. Pt on PO meds and no IV fluids ordered at this time. Spoke with MD about situation and was told that it is ok for pt to be without IV at this time.

## 2014-03-27 NOTE — Evaluation (Signed)
Occupational Therapy Evaluation Patient Details Name: [name] [name] MRN: [id number] DOB: 09-26-1934 Today's Date: 03/27/2014    History of Present Illness This 79 year old man went to his primary care physician for a skin rash but he was noted to have weakness in the right leg. The weakness apparently started today at approximately 4 PM. However, prior to this he had had weakness in the right arm which started approximately 2 days ago. He denies any speech or vision problems. There is been no headache, loss of consciousness. 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 He also does have history of coronary artery disease and peripheral vascular disease. Initial evaluation in the emergency room with MRI brain scan shows him to have an acute CVA in the left frontal parietal lobe. He is being admitted for further management.     Clinical Impression   Pt is presenting to acute OT with above situation.  He has re-gained WFL baseline strength and ROM in RUE since onset yesterday.  Pt remains with some coordination deficits in R hand, and needs cueing to use R hand in tasks (rather than choosing easier option of L hand).  Pt will likely continue improved in coordination, but if improvements are not made, recommend outpatient OT services for improved fine motor coordination and handwriting skills in R hand.  Further acute OT not needed at this time.    Follow Up Recommendations  Outpatient OT    Equipment Recommendations  None recommended by OT    Recommendations for Other Services       Precautions / Restrictions Precautions Precautions: Fall Restrictions Weight Bearing Restrictions: No      Mobility Bed Mobility Overal bed mobility: Independent                Transfers Overall transfer level: Needs assistance Equipment used: None Transfers: Sit to/from Stand Sit to Stand:  Min guard              Balance Overall balance assessment: Needs assistance;History of Falls Sitting-balance support: Feet supported;No upper extremity supported Sitting balance-Leahy Scale: Normal     Standing balance support: No upper extremity supported;During functional activity Standing balance-Leahy Scale: Good                              ADL Overall ADL's : At baseline                                             Vision                     Perception     Praxis      Pertinent Vitals/Pain Pain Assessment: No/denies pain     Hand Dominance Right   Extremity/Trunk Assessment Upper Extremity Assessment Upper Extremity Assessment: Overall WFL for tasks assessed;RUE deficits/detail RUE Deficits / Details: Baseline strength and ROM in RUE.  Decreased coordination. At baseline pt has decrased sensation in R thumb (due to childhood injury). RUE Coordination: decreased fine motor   Lower Extremity Assessment Lower Extremity Assessment: Defer to PT evaluation       Communication Communication Communication: HOH   Cognition Arousal/Alertness: Awake/alert Behavior During Therapy: WFL for tasks assessed/performed Overall Cognitive  Status: Within Functional Limits for tasks assessed                     General Comments       Exercises       Shoulder Instructions      Home Living Family/patient expects to be discharged to:: Private residence Living Arrangements: Spouse/significant other Available Help at Discharge: Family Type of Home: House Home Access: Stairs to enter Technical brewer of Steps: 4 Entrance Stairs-Rails: Right Home Layout: One level     Bathroom Shower/Tub: Occupational psychologist: [place] - quad;Crutches;Shower seat - built in;Grab bars - tub/shower          Prior Functioning/Environment Level of Independence: Independent with assistive  device(s)        Comments: Pt abmulates independently, but reports falls at home.    OT Diagnosis:  (Decreased coordination)   OT Problem List: Decreased coordination   OT Treatment/Interventions:      OT Goals(Current goals can be found in the care plan section) Acute Rehab OT Goals Patient Stated Goal: No acute OT goals needed OT Goal Formulation: With patient  OT Frequency:     Barriers to D/C:            Co-evaluation              End of Session Equipment Utilized During Treatment: Gait belt  Activity Tolerance: Patient tolerated treatment well Patient left: in bed;with family/visitor present;with call bell/phone within reach;with bed alarm set   Time: 8588-5027 OT Time Calculation (min): 29 min Charges:  OT General Charges $OT Visit: 1 Procedure OT Evaluation $Initial OT Evaluation Tier I: 1 Procedure G-Codes:     Bea Graff Sarah-Jane Nazario, MS, OTR/L [place] [phone] 03/27/2014, 11:44 AM

## 2014-03-28 DIAGNOSIS — I639 Cerebral infarction, unspecified: Secondary | ICD-10-CM | POA: Insufficient documentation

## 2014-03-28 LAB — VITAMIN B12: VITAMIN B 12: 228 pg/mL (ref 211–911)

## 2014-03-28 LAB — HOMOCYSTEINE: HOMOCYSTEINE-NORM: 16 umol/L — AB (ref 4.0–15.4)

## 2014-03-28 NOTE — Care Management Note (Addendum)
    Page 1 of 1   03/29/2014     3:09:36 PM CARE MANAGEMENT NOTE 03/29/2014  Patient:  [name] [name], [name] [name]   Account Number:  [id number]  Date Initiated:  03/28/2014  Documentation initiated by:  Theophilus Kinds  Subjective/Objective Assessment:   Pt admitted from home with CVA. Pt lives with his wife and will return home at discharge. Pt is independent with ADL's.     Action/Plan:   CM discussed with pt about outpt OT and pt declines at this time. Pt stated that he and his wife are in an exercise program at the [employer] that his insurance company pays for.   Anticipated DC Date:  03/29/2014   Anticipated DC Plan:  [place]  CM consult      Choice offered to / List presented to:             Status of service:  Completed, signed off Medicare Important Message given?  YES (If response is "NO", the following Medicare IM given date fields will be blank) Date Medicare IM given:  03/29/2014 Medicare IM given by:  Vladimir Creeks Date Additional Medicare IM given:   Additional Medicare IM given by:    Discharge Disposition:  HOME/SELF CARE  Per UR Regulation:  Reviewed for med. necessity/level of care/duration of stay  If discussed at [place] of Stay Meetings, dates discussed:    Comments:  03/29/14 [place] RN/CM  Pt D/C home no needs identified 03/28/14 [employer], Therapist, sports BSN CM

## 2014-03-28 NOTE — Progress Notes (Signed)
[facility] PROGRESS NOTE  [name] [name] OHY:[id number] DOB: [birth year] DOA: 03/26/2014 PCP: Sallee Lange, MD  Assessment/Plan: #1 acute cortical infarcts The MRI of the head. Patient with cortical infarcts with concern for embolic phenomena. Carotid Dopplers with occlusion of the left internal carotid and about 70% right internal carotid artery stenosis. 2-D echo with no source of emboli. Fasting lipid panel data LDL of 71. Homocysteine levels at 16. Patient currently on aspirin for secondary stroke prevention. Patient will likely need TEE to rule out cardioembolic source. Patient may need dual antiplatelet therapy has recently was placed on Effient however patient has been noncompliant with this as it makes him feel dizzy and has not been on aspirin daily. Patient had been on aspirin and Plavix before in the past. Patient will need to discuss with his cardiologist. Neurology following and appreciate input and recommendations.  #2 hypertension Stable. Continue current regimen of Norvasc, lisinopril, Lopressor.  #3 coronary artery disease Stable. Continue Norvasc, lisinopril, Lopressor.  #4 peripheral vascular disease Patient is status post recent stenting of left iliac 7/ 2015. Patient on aspirin now. Effient was held secondary to acute stroke. Patient has not been compliant with his Effient secondary to side effects of dizziness. Cardiology consult was placed for input regarding Effient however patient refused to speak to cardiology covering for his cardiologist and wanted to discuss with [name].  #5 prophylaxis Heparin for DVT prophylaxis.  Code Status: Full Family Communication: Updated patient no family at bedside. Disposition Plan: Home when medically stable.   Consultants:  Neurology: Dr. Merlene Laughter 03/28/2014  Procedures:  Carotid Dopplers 03/27/2014  2-D echo 03/27/2014  MRI MRA head 03/26/2014  CT head 03/26/2014  Chest x-ray  03/26/2014  Antibiotics:  None  HPI/Subjective: Patient states she's feeling better. Patient states right-sided weakness has improved.  Objective: Filed Vitals:   03/28/14 1029  BP: 139/67  Pulse: 62  Temp: 98.5 F (36.9 C)  Resp: 20    Intake/Output Summary (Last 24 hours) at 03/28/14 1206 Last data filed at 03/28/14 0942  Gross per 24 hour  Intake    360 ml  Output      0 ml  Net    360 ml   Filed Weights   03/26/14 1743 03/26/14 2317  Weight: 84.369 kg (186 lb) 82.3 kg (181 lb 7 oz)    Exam:   General:  NAD  Cardiovascular: RRR  Respiratory: CTAB  Abdomen: Soft, nontender, nondistended, positive bowel sounds.  Musculoskeletal: No clubbing cyanosis or edema.  Data Reviewed: Basic Metabolic Panel:  Recent Labs Lab 03/26/14 1821  NA 140  K 4.0  CL 109  CO2 25  GLUCOSE 94  BUN 20  CREATININE 0.89  CALCIUM 8.5   Liver Function Tests:  Recent Labs Lab 03/26/14 1821  AST 15  ALT 13  ALKPHOS 59  BILITOT 0.2*  PROT 6.5  ALBUMIN 3.9   No results for input(s): LIPASE, AMYLASE in the last 168 hours. No results for input(s): AMMONIA in the last 168 hours. CBC:  Recent Labs Lab 03/26/14 1821  WBC 8.0  NEUTROABS 5.2  HGB 10.4*  HCT 34.7*  MCV 72.4*  PLT 176   Cardiac Enzymes:  Recent Labs Lab 03/26/14 1938  TROPONINI <0.03   BNP (last 3 results) No results for input(s): PROBNP in the last 8760 hours. CBG: No results for input(s): GLUCAP in the last 168 hours.  Recent Results (from the past 240 hour(s))  MRSA PCR Screening     Status: None  Collection Time: 03/26/14 11:15 PM  Result Value Ref Range Status   MRSA by PCR NEGATIVE NEGATIVE Final    Comment:        The GeneXpert MRSA Assay (FDA approved for NASAL specimens only), is one component of a comprehensive MRSA colonization surveillance program. It is not intended to diagnose MRSA infection nor to guide or monitor treatment for MRSA infections.       Studies: Dg Chest 2 View  03/26/2014   CLINICAL DATA:  Right-sided weakness, rash  EXAM: CHEST  2 VIEW  COMPARISON:  None.  FINDINGS: Lungs are clear.  No pleural effusion or pneumothorax.  The heart is normal in size.  Mild degenerative changes of the visualized thoracolumbar spine.  Degenerative changes of the bilateral shoulders.  IMPRESSION: No evidence of acute cardiopulmonary disease.   Electronically Signed   By: Julian Hy M.D.   On: 03/26/2014 22:21   Ct Head Wo Contrast  03/26/2014   CLINICAL DATA:  Right-sided weakness, bilateral hand numbness, difficulty walking  EXAM: CT HEAD WITHOUT CONTRAST  TECHNIQUE: Contiguous axial images were obtained from the base of the skull through the vertex without intravenous contrast.  COMPARISON:  12/20/2010  FINDINGS: No evidence of parenchymal hemorrhage or extra-axial fluid collection. No mass lesion, mass effect, or midline shift.  No CT evidence of acute infarction.  Subcortical white matter and periventricular small vessel ischemic changes. Intracranial atherosclerosis.  Mild age related atrophy.  No ventriculomegaly.  Benign-appearing subcutaneous lesion overlying the left frontal bone.  The visualized paranasal sinuses are essentially clear. The mastoid air cells are unopacified.  No evidence of calvarial fracture.  IMPRESSION: No evidence of acute intracranial abnormality.  Atrophy with small vessel ischemic changes and intracranial atherosclerosis.   Electronically Signed   By: Julian Hy M.D.   On: 03/26/2014 19:11   Mr Angiogram Head Wo Contrast  03/26/2014   CLINICAL DATA:  Right-sided weakness.  Stroke.  EXAM: MRI HEAD WITHOUT CONTRAST  MRA HEAD WITHOUT CONTRAST  TECHNIQUE: Multiplanar, multiecho pulse sequences of the brain and surrounding structures were obtained without intravenous contrast. Angiographic images of the head were obtained using MRA technique without contrast.  COMPARISON:  CT 03/26/2014  FINDINGS: MRI HEAD  FINDINGS  Acute infarct left frontal parietal lobe. Acute infarct in the precentral gyrus on the left. Small chronic infarcts in the left posterior temporal lobe measuring under per 5 mm. Small chronic infarct in the left anterior frontal lobe.  Mild chronic microvascular ischemic changes in the white matter bilaterally.  Negative for hemorrhage or mass lesion.  Occlusion of the left internal carotid artery.  Paranasal sinuses clear.  MRA HEAD FINDINGS  Both vertebral arteries are patent. PICA patent. Basilar patent. Left posterior cerebral artery widely patent. Decreased signal in the distal right posterior cerebral artery which may be due to stenosis or occlusion.  Right internal carotid artery is patent with mild atherosclerotic disease in the cavernous segment. Right anterior and middle cerebral arteries are patent bilaterally.  Left internal carotid artery is occluded. There is reconstitution of the supra clinoid internal carotid artery due to collaterals. Left anterior and middle cerebral arteries have decreased signal but are patent. Left M1 segment not well visualized due to low flow.  Negative for cerebral aneurysm.  IMPRESSION: Acute infarct in the left frontal parietal cortex primarily the precentral cortex. Small areas of acute infarct in the left frontal lobe and left posterior temporal lobe.  Mild chronic microvascular ischemia.  Occlusion of the left internal  carotid artery. Left anterior and middle cerebral arteries are supplied via collateral flow. Decreased signal in the right posterior cerebral artery which may be due to stenosis or occlusion.   Electronically Signed   By: Franchot Gallo M.D.   On: 03/26/2014 20:57   Mr Brain Wo Contrast  03/26/2014   CLINICAL DATA:  Right-sided weakness.  Stroke.  EXAM: MRI HEAD WITHOUT CONTRAST  MRA HEAD WITHOUT CONTRAST  TECHNIQUE: Multiplanar, multiecho pulse sequences of the brain and surrounding structures were obtained without intravenous contrast.  Angiographic images of the head were obtained using MRA technique without contrast.  COMPARISON:  CT 03/26/2014  FINDINGS: MRI HEAD FINDINGS  Acute infarct left frontal parietal lobe. Acute infarct in the precentral gyrus on the left. Small chronic infarcts in the left posterior temporal lobe measuring under per 5 mm. Small chronic infarct in the left anterior frontal lobe.  Mild chronic microvascular ischemic changes in the white matter bilaterally.  Negative for hemorrhage or mass lesion.  Occlusion of the left internal carotid artery.  Paranasal sinuses clear.  MRA HEAD FINDINGS  Both vertebral arteries are patent. PICA patent. Basilar patent. Left posterior cerebral artery widely patent. Decreased signal in the distal right posterior cerebral artery which may be due to stenosis or occlusion.  Right internal carotid artery is patent with mild atherosclerotic disease in the cavernous segment. Right anterior and middle cerebral arteries are patent bilaterally.  Left internal carotid artery is occluded. There is reconstitution of the supra clinoid internal carotid artery due to collaterals. Left anterior and middle cerebral arteries have decreased signal but are patent. Left M1 segment not well visualized due to low flow.  Negative for cerebral aneurysm.  IMPRESSION: Acute infarct in the left frontal parietal cortex primarily the precentral cortex. Small areas of acute infarct in the left frontal lobe and left posterior temporal lobe.  Mild chronic microvascular ischemia.  Occlusion of the left internal carotid artery. Left anterior and middle cerebral arteries are supplied via collateral flow. Decreased signal in the right posterior cerebral artery which may be due to stenosis or occlusion.   Electronically Signed   By: Franchot Gallo M.D.   On: 03/26/2014 20:57   US Carotid Bilateral  03/27/2014   CLINICAL DATA:  Acute left hemispheric cerebral infarction. History of hypertension, hyperlipidemia, peripheral  vascular disease and coronary artery disease. Known chronic left internal carotid artery occlusion  EXAM: BILATERAL CAROTID DUPLEX ULTRASOUND  TECHNIQUE: Pearline Cables scale imaging, color Doppler and duplex ultrasound were performed of bilateral carotid and vertebral arteries in the neck.  COMPARISON:  09/12/2012  FINDINGS: Criteria: Quantification of carotid stenosis is based on velocity parameters that correlate the residual internal carotid diameter with NASCET-based stenosis levels, using the diameter of the distal internal carotid lumen as the denominator for stenosis measurement.  The following velocity measurements were obtained:  RIGHT  ICA:  129/32 cm/sec  CCA:  36/64 cm/sec  SYSTOLIC ICA/CCA RATIO:  1.7  DIASTOLIC ICA/CCA RATIO:  2.3  ECA:  358 cm/sec  LEFT  ICA:  No flow  CCA:  40/3 cm/sec  SYSTOLIC ICA/CCA RATIO:  N/A  DIASTOLIC ICA/CCA RATIO:  N/A  ECA:  193 cm/sec  RIGHT CAROTID ARTERY: Mild amount of calcified plaque at the level of the common carotid artery. Significant calcified plaque at the level of the carotid bulb which appears more prominent compared to the prior study. Maximal measured velocity at the level of the distal bulb is 263 cm/sec. This area of velocity elevation and  narrowing is nearly at the origin of the ICA and corresponds to a greater than 70% narrowing. The ICA itself shows no significant velocity elevation.  RIGHT VERTEBRAL ARTERY: Antegrade flow with normal waveform and velocity.  LEFT CAROTID ARTERY: The left ICA is again noted to be completely occluded at its origin with no flow detected.  LEFT VERTEBRAL ARTERY: Antegrade flow with normal waveform and velocity.  IMPRESSION: 1. There is progression of atherosclerosis at the level of the right carotid bulb with velocity elevation corresponding to an estimated greater than 70% distal bulb stenosis. 2. Stable chronic left ICA occlusion.   Electronically Signed   By: Aletta Edouard M.D.   On: 03/27/2014 16:29    Scheduled Meds: .   stroke: mapping our early stages of recovery book   Does not apply Once  . amLODipine  10 mg Oral Daily  . aspirin  325 mg Oral Daily  . heparin  5,000 Units Subcutaneous 3 times per day  . lisinopril  10 mg Oral Daily  . metoprolol  50 mg Oral BID  . mometasone  1 application Topical Daily  . traMADol  50 mg Oral TID   Continuous Infusions:   Principal Problem:   CVA (cerebral infarction) Active Problems:   Essential hypertension, benign   History of stroke   Peripheral vascular disease   CAD s/p Anterior STEMI 07/02/13; s/p PCI + DES to proximal LAD. EF 50%   AAA (abdominal aortic aneurysm)   Stroke    Time spent: 13 minutes    Mustapha Colson M.D. [facility] Pager [phone]. If 7PM-7AM, please contact night-coverage at [email or web address], password [email or web address] 03/28/2014, 12:06 PM  LOS: 2 days

## 2014-03-29 ENCOUNTER — Encounter (HOSPITAL_COMMUNITY)
Admission: EM | Disposition: A | Discharge status: Home / Self Care | Payer: Self-pay | Source: Home / Self Care | Attending: Internal Medicine

## 2014-03-29 ENCOUNTER — Other Ambulatory Visit: Payer: Self-pay

## 2014-03-29 DIAGNOSIS — I6350 Cerebral infarction due to unspecified occlusion or stenosis of unspecified cerebral artery: Secondary | ICD-10-CM

## 2014-03-29 DIAGNOSIS — I4891 Unspecified atrial fibrillation: Secondary | ICD-10-CM

## 2014-03-29 DIAGNOSIS — I10 Essential (primary) hypertension: Secondary | ICD-10-CM

## 2014-03-29 DIAGNOSIS — I2510 Atherosclerotic heart disease of native coronary artery without angina pectoris: Secondary | ICD-10-CM

## 2014-03-29 DIAGNOSIS — I739 Peripheral vascular disease, unspecified: Secondary | ICD-10-CM

## 2014-03-29 LAB — CBC
HCT: 34.4 % — ABNORMAL LOW (ref 39.0–52.0)
Hemoglobin: 10.1 g/dL — ABNORMAL LOW (ref 13.0–17.0)
MCH: 21.2 pg — ABNORMAL LOW (ref 26.0–34.0)
MCHC: 29.4 g/dL — AB (ref 30.0–36.0)
MCV: 72.3 fL — ABNORMAL LOW (ref 78.0–100.0)
PLATELETS: 166 10*3/uL (ref 150–400)
RBC: 4.76 MIL/uL (ref 4.22–5.81)
RDW: 16.8 % — AB (ref 11.5–15.5)
WBC: 7 10*3/uL (ref 4.0–10.5)

## 2014-03-29 LAB — BASIC METABOLIC PANEL
ANION GAP: 6 (ref 5–15)
BUN: 15 mg/dL (ref 6–23)
CALCIUM: 8.4 mg/dL (ref 8.4–10.5)
CO2: 28 mmol/L (ref 19–32)
CREATININE: 0.87 mg/dL (ref 0.50–1.35)
Chloride: 103 mEq/L (ref 96–112)
GFR calc Af Amer: >90 mL/min (ref >90)
GFR, EST NON AFRICAN AMERICAN: 81 mL/min — AB (ref >90)
Glucose, Bld: 101 mg/dL — ABNORMAL HIGH (ref 70–99)
Potassium: 4 mmol/L (ref 3.5–5.1)
SODIUM: 137 mmol/L (ref 135–145)

## 2014-03-29 SURGERY — ECHOCARDIOGRAM, TRANSESOPHAGEAL
Anesthesia: Monitor Anesthesia Care

## 2014-03-29 MED ORDER — CLOPIDOGREL BISULFATE 75 MG PO TABS
75.0000 mg | ORAL_TABLET | Freq: Every day | ORAL | Status: DC
  Administered 2014-03-29: 75 mg via ORAL
  Filled 2014-03-29: qty 1

## 2014-03-29 MED ORDER — CLOPIDOGREL BISULFATE 75 MG PO TABS: 75.0000 mg | ORAL_TABLET | Freq: Every day | ORAL | Status: DC

## 2014-03-29 MED ORDER — PRAVASTATIN SODIUM 20 MG PO TABS: 20.0000 mg | ORAL_TABLET | Freq: Every day | ORAL | Status: DC

## 2014-03-29 MED ORDER — ASPIRIN EC 81 MG PO TBEC: 162.0000 mg | DELAYED_RELEASE_TABLET | Freq: Every day | ORAL | Status: DC

## 2014-03-29 MED ORDER — ASPIRIN 162 MG PO TBEC: 162.0000 mg | DELAYED_RELEASE_TABLET | Freq: Every day | ORAL | Status: DC

## 2014-03-29 NOTE — Progress Notes (Signed)
Patient and family received discharge instructions and scripts and had no further questions or concerns.  Made multiple attempts to schedule appointment with Dr. Merlene Laughter in 2 weeks but office was closed.  Patient verbalized understanding to call and schedule appointment for a visit in 2 weeks.  Patient has a TEE scheduled for 04/03/2014 at 08:00 and verbalized understanding to not have any food or drink after midnight 04/03/2014 except for sips with medications.  Patient was able to verbalized signs and symptoms of a stroke.   Patient in stable condition at discharge.  Patient escorted to vehicle via wheel chair by nurse.
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
## 2014-03-29 NOTE — Discharge Summary (Signed)
Physician Discharge Summary  [name] [name] QJF:[id number] DOB: 06/11/1934 DOA: 03/26/2014  PCP: Sallee Lange, MD  Admit date: 03/26/2014 Discharge date: 03/29/2014  Time spent: 70 minutes  Recommendations for Outpatient Follow-up:  1. Patient will be scheduled for outpatient TEE to be done on Tuesday, 04/03/2014. Patient will also be discharged with a two-week Holter monitor. 2. Patient is to follow-up with Dr. Merlene Laughter in 2 weeks. On follow-up TEE results will need to be followed up upon. Patient will be discharged on 2 baby aspirin and Plavix and neurology will need todiscuss with [name] of cardiology as to whether patient needs to be placed back on his Effient. 3. Patient is to follow-up with [name] on 04/13/2013. On follow-up patient will need an antiplatelet test. Patient was discharged from the hospital on 2 baby aspirins and Plavix and his Effient was discontinued secondary to concern for hemorrhagic conversion. Resumption of patient's Effient will need to be decided at that time and discussions with neurology. 4. Follow-up with PCP one week.  Discharge Diagnoses:  Principal Problem:   CVA (cerebral infarction) Active Problems:   Essential hypertension, benign   History of stroke   Peripheral vascular disease   CAD s/p Anterior STEMI 07/02/13; s/p PCI + DES to proximal LAD. EF 50%   AAA (abdominal aortic aneurysm)   Stroke   Cerebral infarction due to unspecified mechanism   Discharge Condition: stable and improved.  Diet recommendation: heart healthy  Filed Weights   03/26/14 1743 03/26/14 2317  Weight: 84.369 kg (186 lb) 82.3 kg (181 lb 7 oz)    History of present illness:  [name] [name] is a 79 y.o. male  This 79 year old man went to his primary care physician for a skin rash but he was noted to have weakness in the right leg. The weakness apparently started on the day of admission at approximately 4 PM. However, prior to this he had had weakness in the  right arm which started approximately 2 days prior to admission. He denied any speech or visual problems. There had been no headache, loss of consciousness. 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 He also did have history of coronary artery disease and peripheral vascular disease. Initial evaluation in the emergency room with MRI brain scan showed him to have an acute CVA in the left frontal parietal lobe. He was being admitted for further management and stroke workup.  Hospital Course:  #1 acute cortical infarcts CT of the head which was done was negative.The MRI of the head was done which showed that the patient had cortical infarcts with concern for embolic phenomena. TPA was not administered due to delayed presentation. Carotid Dopplers with occlusion of the left internal carotid and about 70% right internal carotid artery stenosis. 2-D echo with no source of emboli. Fasting lipid panel data LDL of 71. Patient with prior intolerance to Lipitor. Will discharge patient on Pravachol.Homocysteine levels at 16. Patient was placed on aspirin for secondary stroke prevention. Patient's Effient was held secondary to concern for hemorrhagic conversion. Patient was seen in consultation by neurology felt patient likely needed a TEE to rule out cardioembolic source. It was also recommended that patient likely needed a 14 day Holter monitor as outpatient. Neurology had also recommended dual antiplatelet therapy with 2 baby aspirins and Plavix. There was concern for hemorrhagic conversion and as such neurology had recommended to hold  patient's Effient. However patient has been noncompliant with his Effient for a couple of days as it made him feel dizzy and had not been on aspirin daily. Patient had been on aspirin and Plavix before in the past. Patient will be discharged on 2 baby aspirins as well as Plavix and will be  following up with neurology in 2 weeks as well as his cardiologist. On follow-up neurology may discuss with cardiology as to whether patient's adherent needs to be resumed in light of his recent history of stents to his heart and to his iliacs. Patient improved clinically and was close to baseline by day of discharge. Patient be discharged in stable and improved condition. Patient will be set up for outpatient TEE to be done and will be discharged on a 2 week Holter monitor.  #2 hypertension Stable. Continued on Norvasc, lisinopril, Lopressor.  #3 coronary artery disease Stable. Continued on home regimen of Norvasc, lisinopril, Lopressor.  #4 peripheral vascular disease Patient is status post recent stenting of left iliac 7/ 2015. Patient on aspirin now. Effient was held secondary to acute stroke and concern for hemorrhagic conversion during this hospitalization. Patient has not been compliant with his Effient secondary to side effects of dizziness. Cardiology consult was placed for input regarding Effient however patient refused to speak to cardiology covering for his cardiologist and wanted to discuss with [name]. Cardiology was re-consulted as patient needed a transesophageal echocardiogram to rule out cardioembolic source and patient was seen in consultation by Dr. Harrington Challenger. It was noted that patient had been on Effient for approximately 6 months.Dr. Harrington Challenger discussed the case with Dr. Burt Knack who felt patient should be okay with aspirin and Plavix as recommended by neurology for secondary stroke prevention and would need close follow-up with cardiology [name] for further discussions as to whether patient needs to be placed back on Effient. An antiplatelet test will need to be obtained at that time.   Procedures:  Carotid Dopplers 03/27/2014  2-D echo 03/27/2014  MRI MRA head 03/26/2014  CT head 03/26/2014  Chest x-ray 03/26/2014  Consultations: Neurology: Dr.Doonquah  03/28/2014  Cardiology: Dr. Harrington Challenger 03/29/2014  Discharge Exam: Filed Vitals:   03/29/14 1056  BP: 140/60  Pulse: 70  Temp:   Resp:     General: NAD Cardiovascular: RRR Respiratory: CTAB  Discharge Instructions   Discharge Instructions    Diet - low sodium heart healthy    Complete by:  As directed      Discharge instructions    Complete by:  As directed   Follow up with Dr Gwenlyn Found on 04/13/13 Follow up with Dr Merlene Laughter in 2 weeks.     Increase activity slowly    Complete by:  As directed           Current Discharge Medication List    START taking these medications   Details  aspirin EC 162 MG EC tablet Take 1 tablet (162 mg total) by mouth daily. Qty: 60 tablet, Refills: 0    clopidogrel (PLAVIX) 75 MG tablet Take 1 tablet (75 mg total) by mouth daily. Qty: 30 tablet, Refills: 0    pravastatin (PRAVACHOL) 20 MG tablet Take 1 tablet (20 mg total) by mouth daily. Qty: 30 tablet, Refills: 0      CONTINUE these medications which have NOT CHANGED   Details  acetaminophen (TYLENOL) 500 MG tablet Take 500 mg by mouth every 6 (six) hours as needed for mild pain or moderate pain.     Aloe  LIQD Take 1 application by mouth daily as needed (for skin irritation).    amLODipine (NORVASC) 10 MG tablet Take 1 tablet (10 mg total) by mouth daily. Qty: 90 tablet, Refills: 1    lisinopril (PRINIVIL,ZESTRIL) 10 MG tablet Take 1 tablet (10 mg total) by mouth daily. Qty: 90 tablet, Refills: 3   Associated Diagnoses: CAD (coronary artery disease)    methylcellulose (ARTIFICIAL TEARS) 1 % ophthalmic solution Place 1 drop into both eyes as needed (for dry eyes).    metoprolol (LOPRESSOR) 50 MG tablet TAKE 1 TABLET TWICE DAILY   Qty: 180 tablet, Refills: 1    naproxen sodium (ALEVE) 220 MG tablet Take 440 mg by mouth daily as needed (for pain).    nitroGLYCERIN (NITROSTAT) 0.4 MG SL tablet Place 1 tablet (0.4 mg total) under the tongue every 5 (five) minutes as needed for chest  pain. Qty: 25 tablet, Refills: 2    traMADol (ULTRAM) 50 MG tablet Take 1 tablet (50 mg total) by mouth 3 (three) times daily. Qty: 90 tablet, Refills: 5    mometasone (ELOCON) 0.1 % cream Apply to affected area daily Qty: 45 g, Refills: 1      STOP taking these medications     prasugrel (EFFIENT) 10 MG TABS tablet        Allergies  Allergen Reactions  . Brilinta [Ticagrelor] Shortness Of Breath    SOB  . Influenza Vaccines     Pt states he has been hospitalized both times he was given flu vaccine as a younger adult while in the [employer] and they told him not to take it again  . Lipitor [Atorvastatin] Other (See Comments)    Muscle and joint pain   Follow-up Information    Follow up with Lorretta Harp, MD On 04/13/2014.   Specialty:  Cardiology   Why:  Or PA AT 10:45 AM   Contact information:   [address] [place] [address] [phone]       Follow up with [facility], KOFI, MD In 2 weeks.   Specialty:  Neurology   Why:  Call office to schedule appointment   Contact information:   [address] [place] [address] [phone]       Follow up with Sallee Lange, MD In 1 week.   Specialty:  Family Medicine   Why:  Call office to schedule appointment   Contact information:   [address] [place] [address] [phone]       Follow up with [facility] On 04/03/2014.   Why:  8 am for TEE. Do not eat or drink anything after midnight night before test. Take medications with sipof water   Contact information:   [address] [place] [phone]        The results of significant diagnostics from this hospitalization (including imaging, microbiology, ancillary and laboratory) are listed below for reference.    Significant Diagnostic Studies: Dg Chest 2 View  03/26/2014   CLINICAL DATA:  Right-sided weakness, rash  EXAM: CHEST  2 VIEW  COMPARISON:  None.  FINDINGS: Lungs are clear.  No pleural  effusion or pneumothorax.  The heart is normal in size.  Mild degenerative changes of the visualized thoracolumbar spine.  Degenerative changes of the bilateral shoulders.  IMPRESSION: No evidence of acute cardiopulmonary disease.   Electronically Signed   By: Julian Hy M.D.   On: 03/26/2014 22:21   Ct Head Wo Contrast  03/26/2014  CLINICAL DATA:  Right-sided weakness, bilateral hand numbness, difficulty walking  EXAM: CT HEAD WITHOUT CONTRAST  TECHNIQUE: Contiguous axial images were obtained from the base of the skull through the vertex without intravenous contrast.  COMPARISON:  12/20/2010  FINDINGS: No evidence of parenchymal hemorrhage or extra-axial fluid collection. No mass lesion, mass effect, or midline shift.  No CT evidence of acute infarction.  Subcortical white matter and periventricular small vessel ischemic changes. Intracranial atherosclerosis.  Mild age related atrophy.  No ventriculomegaly.  Benign-appearing subcutaneous lesion overlying the left frontal bone.  The visualized paranasal sinuses are essentially clear. The mastoid air cells are unopacified.  No evidence of calvarial fracture.  IMPRESSION: No evidence of acute intracranial abnormality.  Atrophy with small vessel ischemic changes and intracranial atherosclerosis.   Electronically Signed   By: Julian Hy M.D.   On: 03/26/2014 19:11   Mr Angiogram Head Wo Contrast  03/26/2014   CLINICAL DATA:  Right-sided weakness.  Stroke.  EXAM: MRI HEAD WITHOUT CONTRAST  MRA HEAD WITHOUT CONTRAST  TECHNIQUE: Multiplanar, multiecho pulse sequences of the brain and surrounding structures were obtained without intravenous contrast. Angiographic images of the head were obtained using MRA technique without contrast.  COMPARISON:  CT 03/26/2014  FINDINGS: MRI HEAD FINDINGS  Acute infarct left frontal parietal lobe. Acute infarct in the precentral gyrus on the left. Small chronic infarcts in the left posterior temporal lobe measuring  under per 5 mm. Small chronic infarct in the left anterior frontal lobe.  Mild chronic microvascular ischemic changes in the white matter bilaterally.  Negative for hemorrhage or mass lesion.  Occlusion of the left internal carotid artery.  Paranasal sinuses clear.  MRA HEAD FINDINGS  Both vertebral arteries are patent. PICA patent. Basilar patent. Left posterior cerebral artery widely patent. Decreased signal in the distal right posterior cerebral artery which may be due to stenosis or occlusion.  Right internal carotid artery is patent with mild atherosclerotic disease in the cavernous segment. Right anterior and middle cerebral arteries are patent bilaterally.  Left internal carotid artery is occluded. There is reconstitution of the supra clinoid internal carotid artery due to collaterals. Left anterior and middle cerebral arteries have decreased signal but are patent. Left M1 segment not well visualized due to low flow.  Negative for cerebral aneurysm.  IMPRESSION: Acute infarct in the left frontal parietal cortex primarily the precentral cortex. Small areas of acute infarct in the left frontal lobe and left posterior temporal lobe.  Mild chronic microvascular ischemia.  Occlusion of the left internal carotid artery. Left anterior and middle cerebral arteries are supplied via collateral flow. Decreased signal in the right posterior cerebral artery which may be due to stenosis or occlusion.   Electronically Signed   By: Franchot Gallo M.D.   On: 03/26/2014 20:57   Mr Brain Wo Contrast  03/26/2014   CLINICAL DATA:  Right-sided weakness.  Stroke.  EXAM: MRI HEAD WITHOUT CONTRAST  MRA HEAD WITHOUT CONTRAST  TECHNIQUE: Multiplanar, multiecho pulse sequences of the brain and surrounding structures were obtained without intravenous contrast. Angiographic images of the head were obtained using MRA technique without contrast.  COMPARISON:  CT 03/26/2014  FINDINGS: MRI HEAD FINDINGS  Acute infarct left frontal  parietal lobe. Acute infarct in the precentral gyrus on the left. Small chronic infarcts in the left posterior temporal lobe measuring under per 5 mm. Small chronic infarct in the left anterior frontal lobe.  Mild chronic microvascular ischemic changes in the white matter bilaterally.  Negative for  hemorrhage or mass lesion.  Occlusion of the left internal carotid artery.  Paranasal sinuses clear.  MRA HEAD FINDINGS  Both vertebral arteries are patent. PICA patent. Basilar patent. Left posterior cerebral artery widely patent. Decreased signal in the distal right posterior cerebral artery which may be due to stenosis or occlusion.  Right internal carotid artery is patent with mild atherosclerotic disease in the cavernous segment. Right anterior and middle cerebral arteries are patent bilaterally.  Left internal carotid artery is occluded. There is reconstitution of the supra clinoid internal carotid artery due to collaterals. Left anterior and middle cerebral arteries have decreased signal but are patent. Left M1 segment not well visualized due to low flow.  Negative for cerebral aneurysm.  IMPRESSION: Acute infarct in the left frontal parietal cortex primarily the precentral cortex. Small areas of acute infarct in the left frontal lobe and left posterior temporal lobe.  Mild chronic microvascular ischemia.  Occlusion of the left internal carotid artery. Left anterior and middle cerebral arteries are supplied via collateral flow. Decreased signal in the right posterior cerebral artery which may be due to stenosis or occlusion.   Electronically Signed   By: Franchot Gallo M.D.   On: 03/26/2014 20:57   US Carotid Bilateral  03/27/2014   CLINICAL DATA:  Acute left hemispheric cerebral infarction. History of hypertension, hyperlipidemia, peripheral vascular disease and coronary artery disease. Known chronic left internal carotid artery occlusion  EXAM: BILATERAL CAROTID DUPLEX ULTRASOUND  TECHNIQUE: Pearline Cables scale  imaging, color Doppler and duplex ultrasound were performed of bilateral carotid and vertebral arteries in the neck.  COMPARISON:  09/12/2012  FINDINGS: Criteria: Quantification of carotid stenosis is based on velocity parameters that correlate the residual internal carotid diameter with NASCET-based stenosis levels, using the diameter of the distal internal carotid lumen as the denominator for stenosis measurement.  The following velocity measurements were obtained:  RIGHT  ICA:  129/32 cm/sec  CCA:  12/75 cm/sec  SYSTOLIC ICA/CCA RATIO:  1.7  DIASTOLIC ICA/CCA RATIO:  2.3  ECA:  358 cm/sec  LEFT  ICA:  No flow  CCA:  17/0 cm/sec  SYSTOLIC ICA/CCA RATIO:  N/A  DIASTOLIC ICA/CCA RATIO:  N/A  ECA:  193 cm/sec  RIGHT CAROTID ARTERY: Mild amount of calcified plaque at the level of the common carotid artery. Significant calcified plaque at the level of the carotid bulb which appears more prominent compared to the prior study. Maximal measured velocity at the level of the distal bulb is 263 cm/sec. This area of velocity elevation and narrowing is nearly at the origin of the ICA and corresponds to a greater than 70% narrowing. The ICA itself shows no significant velocity elevation.  RIGHT VERTEBRAL ARTERY: Antegrade flow with normal waveform and velocity.  LEFT CAROTID ARTERY: The left ICA is again noted to be completely occluded at its origin with no flow detected.  LEFT VERTEBRAL ARTERY: Antegrade flow with normal waveform and velocity.  IMPRESSION: 1. There is progression of atherosclerosis at the level of the right carotid bulb with velocity elevation corresponding to an estimated greater than 70% distal bulb stenosis. 2. Stable chronic left ICA occlusion.   Electronically Signed   By: Aletta Edouard M.D.   On: 03/27/2014 16:29    Microbiology: Recent Results (from the past 240 hour(s))  MRSA PCR Screening     Status: None   Collection Time: 03/26/14 11:15 PM  Result Value Ref Range Status   MRSA by PCR  NEGATIVE NEGATIVE Final    Comment:  The GeneXpert MRSA Assay (FDA approved for NASAL specimens only), is one component of a comprehensive MRSA colonization surveillance program. It is not intended to diagnose MRSA infection nor to guide or monitor treatment for MRSA infections.      Labs: Basic Metabolic Panel:  Recent Labs Lab 03/26/14 1821 03/29/14 0737  NA 140 137  K 4.0 4.0  CL 109 103  CO2 25 28  GLUCOSE 94 101*  BUN 20 15  CREATININE 0.89 0.87  CALCIUM 8.5 8.4   Liver Function Tests:  Recent Labs Lab 03/26/14 1821  AST 15  ALT 13  ALKPHOS 59  BILITOT 0.2*  PROT 6.5  ALBUMIN 3.9   No results for input(s): LIPASE, AMYLASE in the last 168 hours. No results for input(s): AMMONIA in the last 168 hours. CBC:  Recent Labs Lab 03/26/14 1821 03/29/14 0737  WBC 8.0 7.0  NEUTROABS 5.2  --   HGB 10.4* 10.1*  HCT 34.7* 34.4*  MCV 72.4* 72.3*  PLT 176 166   Cardiac Enzymes:  Recent Labs Lab 03/26/14 1938  TROPONINI <0.03   BNP: BNP (last 3 results) No results for input(s): PROBNP in the last 8760 hours. CBG: No results for input(s): GLUCAP in the last 168 hours.     SignedIrine Seal MD [facility] 03/29/2014, 2:15 PM

## 2014-03-30 HISTORY — PX: ESOPHAGOGASTRODUODENOSCOPY: SHX1529

## 2014-03-30 HISTORY — PX: COLONOSCOPY: SHX174

## 2014-04-02 ENCOUNTER — Telehealth: Payer: Self-pay | Admitting: Neurology

## 2014-04-02 NOTE — Telephone Encounter (Signed)
Pt had a bleed stroke on 03/26/14 and was released from [facility] on Thursday 03/29/14.  The first available appointment I could offer was 07/09/14.  Wife states pt is supposed to f/u in a couple of weeks.  She would like for a nurse to call and advise of this wait, or make a work in appointment for him.  Please call and advise.

## 2014-04-03 ENCOUNTER — Other Ambulatory Visit (HOSPITAL_COMMUNITY): Payer: Commercial Managed Care - HMO

## 2014-04-03 ENCOUNTER — Ambulatory Visit (HOSPITAL_COMMUNITY)
Admission: RE | Admit: 2014-04-03 | Discharge: 2014-04-03 | Disposition: A | Discharge status: Home / Self Care | Payer: PPO | Source: Ambulatory Visit | Attending: Cardiology | Admitting: Cardiology

## 2014-04-03 ENCOUNTER — Encounter (HOSPITAL_COMMUNITY)
Admission: RE | Disposition: A | Discharge status: Home / Self Care | Payer: Self-pay | Source: Ambulatory Visit | Attending: Cardiology

## 2014-04-03 ENCOUNTER — Encounter (HOSPITAL_COMMUNITY): Payer: Self-pay | Admitting: *Deleted

## 2014-04-03 ENCOUNTER — Other Ambulatory Visit: Payer: Self-pay | Admitting: Internal Medicine

## 2014-04-03 DIAGNOSIS — I739 Peripheral vascular disease, unspecified: Secondary | ICD-10-CM | POA: Diagnosis not present

## 2014-04-03 DIAGNOSIS — Z888 Allergy status to other drugs, medicaments and biological substances status: Secondary | ICD-10-CM | POA: Insufficient documentation

## 2014-04-03 DIAGNOSIS — I252 Old myocardial infarction: Secondary | ICD-10-CM | POA: Diagnosis not present

## 2014-04-03 DIAGNOSIS — Z8349 Family history of other endocrine, nutritional and metabolic diseases: Secondary | ICD-10-CM | POA: Insufficient documentation

## 2014-04-03 DIAGNOSIS — I2510 Atherosclerotic heart disease of native coronary artery without angina pectoris: Secondary | ICD-10-CM | POA: Insufficient documentation

## 2014-04-03 DIAGNOSIS — Z87891 Personal history of nicotine dependence: Secondary | ICD-10-CM | POA: Insufficient documentation

## 2014-04-03 DIAGNOSIS — E785 Hyperlipidemia, unspecified: Secondary | ICD-10-CM | POA: Insufficient documentation

## 2014-04-03 DIAGNOSIS — Z8249 Family history of ischemic heart disease and other diseases of the circulatory system: Secondary | ICD-10-CM | POA: Insufficient documentation

## 2014-04-03 DIAGNOSIS — I340 Nonrheumatic mitral (valve) insufficiency: Secondary | ICD-10-CM

## 2014-04-03 DIAGNOSIS — Z8673 Personal history of transient ischemic attack (TIA), and cerebral infarction without residual deficits: Secondary | ICD-10-CM | POA: Insufficient documentation

## 2014-04-03 DIAGNOSIS — I639 Cerebral infarction, unspecified: Secondary | ICD-10-CM

## 2014-04-03 DIAGNOSIS — Z833 Family history of diabetes mellitus: Secondary | ICD-10-CM | POA: Insufficient documentation

## 2014-04-03 DIAGNOSIS — I10 Essential (primary) hypertension: Secondary | ICD-10-CM | POA: Diagnosis not present

## 2014-04-03 HISTORY — PX: TEE WITHOUT CARDIOVERSION: SHX5443

## 2014-04-03 SURGERY — Surgical Case
Anesthesia: *Unknown

## 2014-04-03 SURGERY — ECHOCARDIOGRAM, TRANSESOPHAGEAL
Anesthesia: Monitor Anesthesia Care

## 2014-04-03 MED ORDER — SODIUM CHLORIDE BACTERIOSTATIC 0.9 % IJ SOLN
INTRAMUSCULAR | Status: AC
  Filled 2014-04-03: qty 20

## 2014-04-03 MED ORDER — FENTANYL CITRATE 0.05 MG/ML IJ SOLN
INTRAMUSCULAR | Status: AC
  Filled 2014-04-03: qty 4

## 2014-04-03 MED ORDER — LIDOCAINE VISCOUS 2 % MT SOLN
OROMUCOSAL | Status: AC
  Filled 2014-04-03: qty 15

## 2014-04-03 MED ORDER — SODIUM CHLORIDE 0.9 % IV SOLN: INTRAVENOUS | Status: DC

## 2014-04-03 MED ORDER — BUTAMBEN-TETRACAINE-BENZOCAINE 2-2-14 % EX AERO
INHALATION_SPRAY | CUTANEOUS | Status: DC | PRN
  Administered 2014-04-03: 2 via TOPICAL

## 2014-04-03 MED ORDER — FENTANYL CITRATE 0.05 MG/ML IJ SOLN
INTRAMUSCULAR | Status: DC | PRN
  Administered 2014-04-03: 50 ug via INTRAVENOUS

## 2014-04-03 MED ORDER — MIDAZOLAM HCL 5 MG/5ML IJ SOLN
INTRAMUSCULAR | Status: AC
  Filled 2014-04-03: qty 10

## 2014-04-03 MED ORDER — MIDAZOLAM HCL 5 MG/5ML IJ SOLN
INTRAMUSCULAR | Status: DC | PRN
  Administered 2014-04-03: 1 mg via INTRAVENOUS

## 2014-04-03 MED ORDER — LIDOCAINE VISCOUS 2 % MT SOLN
OROMUCOSAL | Status: DC | PRN
  Administered 2014-04-03 (×2): 3 mL via OROMUCOSAL

## 2014-04-03 MED ORDER — SODIUM CHLORIDE 0.9 % IV SOLN
INTRAVENOUS | Status: DC
  Administered 2014-04-03: 10:00:00 via INTRAVENOUS

## 2014-04-03 NOTE — Discharge Instructions (Signed)
Esophagogastroduodenoscopy °Care After °Refer to this sheet in the next few weeks. These instructions provide you with information on caring for yourself after your procedure. Your caregiver may also give you more specific instructions. Your treatment has been planned according to current medical practices, but problems sometimes occur. Call your caregiver if you have any problems or questions after your procedure.  °HOME CARE INSTRUCTIONS °· Do not eat or drink anything until the numbing medicine (local anesthetic) has worn off and your gag reflex has returned. You will know that the local anesthetic has worn off when you can swallow comfortably. °· Do not drive for 12 hours after the procedure or as directed by your caregiver. °· Only take medicines as directed by your caregiver. °SEEK MEDICAL CARE IF:  °· You cannot stop coughing. °· You are not urinating at all or less than usual. °SEEK IMMEDIATE MEDICAL CARE IF: °· You have difficulty swallowing. °· You cannot eat or drink. °· You have worsening throat or chest pain. °· You have dizziness, lightheadedness, or you faint. °· You have nausea or vomiting. °· You have chills. °· You have a fever. °· You have severe abdominal pain. °· You have black, tarry, or bloody stools. °Document Released: 03/02/2012 Document Reviewed: 03/02/2012 °[employer]® Patient Information ©2015 [employer]. This information is not intended to replace advice given to you by your health care provider. Make sure you discuss any questions you have with your health care provider. ° °
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
## 2014-04-03 NOTE — Telephone Encounter (Signed)
See note below

## 2014-04-03 NOTE — Procedures (Signed)
TEE Procedure Note  Patient was brought to the endoscopy suite once appropriate consents were obtained. The oropharynx was anesthesized with oral viscous lidocaine and cetacaine spray. He was positioned in the left lateral decubitus position. Moderate sedation was achieved with 1mg  of versed and 50 mcg of fentanyl. The TEE probe was intubated into the esophagus without difficulty. The patient tolerated the procedure without any complications. Please see full TEE report for complete findings. Overall normal LV function, mild MR. There was no intracardiac thrombus, normal appendage emptying velocities, no clear evidence for intracardiac shunt. Overall no source for cardiac related CVA.   [name] MD

## 2014-04-03 NOTE — Telephone Encounter (Signed)
Spoke with patient's wife and informed her that Dr Leonie Man is out of the office, patient would prefer not to have a appointment on the 15 th due to another appointment already scheduled. Per emr notes patient was to be seen within 2 weeks but was referred to Dr Octavio Graves office in [place], family prefers to stay with Dr Leonie Man, please advise

## 2014-04-03 NOTE — Progress Notes (Signed)
  Echocardiogram Echocardiogram Transesophageal has been performed.  [place], [place] 04/03/2014, 12:12 PM

## 2014-04-04 ENCOUNTER — Telehealth: Payer: Self-pay | Admitting: *Deleted

## 2014-04-04 ENCOUNTER — Encounter: Payer: Self-pay | Admitting: Family Medicine

## 2014-04-04 ENCOUNTER — Ambulatory Visit (INDEPENDENT_AMBULATORY_CARE_PROVIDER_SITE_OTHER): Payer: PPO | Admitting: Family Medicine

## 2014-04-04 VITALS — BP 140/64 | Ht 68.0 in | Wt 189.0 lb

## 2014-04-04 DIAGNOSIS — I10 Essential (primary) hypertension: Secondary | ICD-10-CM

## 2014-04-04 DIAGNOSIS — I639 Cerebral infarction, unspecified: Secondary | ICD-10-CM

## 2014-04-04 DIAGNOSIS — E785 Hyperlipidemia, unspecified: Secondary | ICD-10-CM

## 2014-04-04 NOTE — Progress Notes (Signed)
   Subjective:    Patient ID: [name] [name], male    DOB: [birth year], 80 y.o.   MRN: [id number]  HPI Patient is here today for a hospital follow up visit. Patient was admitted into [facility] last week with a stroke. Patient states that he is doing much better now. Patient notes some weakness on the right side. Patient states that he has no other concerns at this time.  Hospital notes were reviewed. The importance of continuing medication reviewed. Also encourage patient not take any anti-inflammatory while on Plavix and aspirin.  Review of Systems Denies any unilateral numbness or weakness denies any chest tightness pressure pain shortness of breath.    Objective:   Physical Exam  There is no unilateral weakness. Lungs are clear heart regular currently. Extremities no edema. Patient able to talk well swallow well without difficulties.      Assessment & Plan:  #1 recent stroke-he seems to have recovered from all of this aspect. Doing better.  #2 hyperlipidemia patient has not tolerated statins in the past, I have encouraged him to stick with pravastatin for he is willing currently  #3 has a history hypertension blood pressure under very good control currently  #4 patient will be following up with cardiology in the near future he currently has a monitor on his transesophageal Doppler overall looked good  #5 patient does have follow-up with neurology.

## 2014-04-04 NOTE — Telephone Encounter (Signed)
-----   Message from Fay Records, MD sent at 04/03/2014  6:09 PM EST ----- TEE results need to go to primary MD and neurology

## 2014-04-04 NOTE — Telephone Encounter (Signed)
Forwarded to neuro and [name].

## 2014-04-05 ENCOUNTER — Encounter (HOSPITAL_COMMUNITY): Payer: Self-pay | Admitting: Cardiology

## 2014-04-05 NOTE — Telephone Encounter (Signed)
Patient was seen in the hospital and was referred to Dr Octavio Graves office in [place], patient is already a patient of Dr Leonie Man, but he does not have any available openings, Dr Erlinda Hong would you be willing to see this patient?

## 2014-04-05 NOTE — Telephone Encounter (Signed)
Seems like this is a new consult for Dr Erlinda Hong or me whoever is available sooner

## 2014-04-05 NOTE — Telephone Encounter (Signed)
Looks like a new consult as he has never been seen in [place] before. I can see the patient. We need a at least 94min slot. Thanks.  Rosalin Hawking, MD PhD Stroke Neurology 04/05/2014 2:39 PM

## 2014-04-06 NOTE — Telephone Encounter (Signed)
Called patient and scheduled him for 04/11/14 at 2pm

## 2014-04-11 ENCOUNTER — Ambulatory Visit (INDEPENDENT_AMBULATORY_CARE_PROVIDER_SITE_OTHER): Payer: PPO | Admitting: Neurology

## 2014-04-11 ENCOUNTER — Telehealth: Payer: Self-pay | Admitting: Family Medicine

## 2014-04-11 ENCOUNTER — Encounter: Payer: Self-pay | Admitting: Neurology

## 2014-04-11 VITALS — BP 111/63 | HR 68 | Ht 67.5 in | Wt 188.6 lb

## 2014-04-11 DIAGNOSIS — I10 Essential (primary) hypertension: Secondary | ICD-10-CM

## 2014-04-11 DIAGNOSIS — I25119 Atherosclerotic heart disease of native coronary artery with unspecified angina pectoris: Secondary | ICD-10-CM

## 2014-04-11 DIAGNOSIS — I63231 Cerebral infarction due to unspecified occlusion or stenosis of right carotid arteries: Secondary | ICD-10-CM

## 2014-04-11 DIAGNOSIS — E538 Deficiency of other specified B group vitamins: Secondary | ICD-10-CM

## 2014-04-11 DIAGNOSIS — I6522 Occlusion and stenosis of left carotid artery: Secondary | ICD-10-CM

## 2014-04-11 DIAGNOSIS — I739 Peripheral vascular disease, unspecified: Secondary | ICD-10-CM | POA: Diagnosis not present

## 2014-04-11 NOTE — Patient Instructions (Signed)
-   continue ASA and pravastatin for stroke prevention - check BP at home at least twice for 2 weeks and write down. BP goal 120-150 for adequate blood perfusion - I will forward notes to Dr. Alvester Chou and Dr. Kellie Simmering to consider right ICA stenting - follow up with cardiology for CAD and cardiac monitoring - Follow up with your primary care physician for stroke risk factor modification. Recommend maintain blood pressure goal <130/80, diabetes with hemoglobin A1c goal below 6.5% and lipids with LDL cholesterol goal below 70 mg/dL.  - follow up in 2 months.

## 2014-04-11 NOTE — Telephone Encounter (Signed)
Changed patient's pharmacy to [facility] in [place].

## 2014-04-11 NOTE — Telephone Encounter (Signed)
FYI all meds to be sent to [facility] for this year   [phone] fax [phone] phone
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
## 2014-04-12 DIAGNOSIS — E538 Deficiency of other specified B group vitamins: Secondary | ICD-10-CM | POA: Insufficient documentation

## 2014-04-12 DIAGNOSIS — I25119 Atherosclerotic heart disease of native coronary artery with unspecified angina pectoris: Secondary | ICD-10-CM | POA: Insufficient documentation

## 2014-04-12 DIAGNOSIS — I10 Essential (primary) hypertension: Secondary | ICD-10-CM | POA: Insufficient documentation

## 2014-04-12 DIAGNOSIS — I63231 Cerebral infarction due to unspecified occlusion or stenosis of right carotid arteries: Secondary | ICD-10-CM | POA: Insufficient documentation

## 2014-04-12 DIAGNOSIS — I739 Peripheral vascular disease, unspecified: Secondary | ICD-10-CM | POA: Insufficient documentation

## 2014-04-12 DIAGNOSIS — I6522 Occlusion and stenosis of left carotid artery: Secondary | ICD-10-CM | POA: Insufficient documentation

## 2014-04-12 MED ORDER — VITAMIN B-12 1000 MCG PO TABS: 1000.0000 ug | ORAL_TABLET | Freq: Every day | ORAL | Status: DC

## 2014-04-12 NOTE — Addendum Note (Signed)
Addended by: Rosalin Hawking on: 04/12/2014 09:46 PM   Modules accepted: Orders

## 2014-04-12 NOTE — Progress Notes (Signed)
Curt Bears, let's get patient back to see me tomorow

## 2014-04-12 NOTE — Progress Notes (Signed)
Patient has an appt with Dr Gwenlyn Found tomorrow at 10:45.

## 2014-04-13 ENCOUNTER — Ambulatory Visit (INDEPENDENT_AMBULATORY_CARE_PROVIDER_SITE_OTHER): Payer: PPO | Admitting: Cardiovascular Disease

## 2014-04-13 ENCOUNTER — Encounter: Payer: Self-pay | Admitting: Cardiovascular Disease

## 2014-04-13 VITALS — BP 158/75 | HR 57 | Ht 68.0 in | Wt 188.6 lb

## 2014-04-13 DIAGNOSIS — I6522 Occlusion and stenosis of left carotid artery: Secondary | ICD-10-CM

## 2014-04-13 DIAGNOSIS — I6529 Occlusion and stenosis of unspecified carotid artery: Secondary | ICD-10-CM

## 2014-04-13 DIAGNOSIS — I739 Peripheral vascular disease, unspecified: Secondary | ICD-10-CM

## 2014-04-13 DIAGNOSIS — Z79899 Other long term (current) drug therapy: Secondary | ICD-10-CM

## 2014-04-13 DIAGNOSIS — I10 Essential (primary) hypertension: Secondary | ICD-10-CM

## 2014-04-13 DIAGNOSIS — I2102 ST elevation (STEMI) myocardial infarction involving left anterior descending coronary artery: Secondary | ICD-10-CM

## 2014-04-13 DIAGNOSIS — I639 Cerebral infarction, unspecified: Secondary | ICD-10-CM

## 2014-04-13 DIAGNOSIS — D689 Coagulation defect, unspecified: Secondary | ICD-10-CM

## 2014-04-13 DIAGNOSIS — E785 Hyperlipidemia, unspecified: Secondary | ICD-10-CM

## 2014-04-13 NOTE — Assessment & Plan Note (Signed)
History of hypertension blood pressure measures 150/75. He is on amlodipine, lisinopril and metoprolol. Continue current meds at current dosing

## 2014-04-13 NOTE — Assessment & Plan Note (Signed)
History of hyperlipidemia on pravastatin 20 mg a day with his most recent lipid profile performed 03/27/14 revealing a total cholesterol 134, LDL 71 and HDL of 24

## 2014-04-13 NOTE — Progress Notes (Signed)
04/13/2014 [name] [name]   01-12-1935  [id number]  Primary Physician Sallee Lange, MD Primary Cardiologist: Lorretta Harp MD [place], [name]   HPI:  [name] [name] is a 80y.o. 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 I last saw him 6 months ago. He was admitted on 07/02/13 for Anterior STEMI. Presenting symptoms included severe epigastric burning pain/ indigestion and left arm pain. Initial troponin was > 20.0. He underwent emergent LHC and successful aspiration thrombectomy, PCI and stenting of proximal LAD by [name], via the right femoral artery . The overall LVEF was estimated at 50%, without wall motion abnormalities. He tolerated the procedure well and left the cath lab in stable condition. DAPT with ASA and Brilinta was initiated. He was also placed on a BB, ACE-I and statin. His post PCI course was w/o complication. He was discharged home on 07/05/13.  It should also be noted that at time of emergent LHC, he was found to also have significant peripheral vascular disease with an occluded left iliac and high-grade right common iliac artery stenosis, as well as a small AAA. The patient complained of bilateral LEE. [name] ordered for him to undergo bilateral LEAs. The study was performed in our office on 07/18/13. Findings are as follows: ABI 0.95 on Rt, 0.59 on Lt.the patient underwent angiography, diamondback orbital rotational arthrectomy, PTA and stenting of a highly calcified ostial right common iliac artery stenosis. He had excellent angiographic and clinical result. His Dopplers normalized to no longer has right lower extremity claudication. He wishes to proceed with attempt at percutaneous opacification of his left common iliac chronic total occlusion. He is not tolerating his Brilenta because of shortness of breath and we subsequently transitioned to Plavix. He recently had a left brain stroke  thought to be related to a watershed infarct from a hemodynamically significant right carotid stenosis in the setting of an occluded left carotid. He was evaluated by Dr. Erlinda Hong from [facility]. The patient will be a candidate for right carotid stenting should be found that he had a 70% or greater hemodynamically significant lesion.   Current Outpatient Prescriptions  Medication Sig Dispense Refill  . acetaminophen (TYLENOL) 500 MG tablet Take 500 mg by mouth every 6 (six) hours as needed for mild pain or moderate pain.     [name] amLODipine (NORVASC) 10 MG tablet Take 1 tablet (10 mg total) by mouth daily. 90 tablet 1  . aspirin 81 MG tablet Take 81 mg by mouth daily. Taking 1/2 of  81 mg tablet    . clopidogrel (PLAVIX) 75 MG tablet Take 1 tablet (75 mg total) by mouth daily. 30 tablet 0  . lisinopril (PRINIVIL,ZESTRIL) 10 MG tablet Take 1 tablet (10 mg total) by mouth daily. 90 tablet 3  . methylcellulose (ARTIFICIAL TEARS) 1 % ophthalmic solution Place 1 drop into both eyes as needed (for dry eyes).    . metoprolol (LOPRESSOR) 50 MG tablet TAKE 1 TABLET TWICE DAILY   (Patient taking differently: TAKE 1 TABLET TWICE DAILY) 180 tablet 1  . mometasone (ELOCON) 0.1 % cream Apply to affected area daily (Patient taking differently: Apply 1 application topically daily. Apply to affected area daily) 45 g 1  . nitroGLYCERIN (NITROSTAT) 0.4 MG SL tablet Place 1 tablet (0.4 mg total) under the tongue every 5 (five) minutes as needed for chest pain. 25 tablet 2  . pravastatin (PRAVACHOL) 20 MG tablet  Take 1 tablet (20 mg total) by mouth daily. 30 tablet 0  . traMADol (ULTRAM) 50 MG tablet Take 1 tablet (50 mg total) by mouth 3 (three) times daily. 90 tablet 5  . vitamin B-12 (CYANOCOBALAMIN) 1000 MCG tablet Take 1 tablet (1,000 mcg total) by mouth daily. 90 tablet 3   No current facility-administered medications for this visit.    Allergies  Allergen Reactions  . Brilinta [Ticagrelor]  Shortness Of Breath    SOB  . Influenza Vaccines     Pt states he has been hospitalized both times he was given flu vaccine as a younger adult while in the [employer] and they told him not to take it again  . Lipitor [Atorvastatin] Other (See Comments)    Muscle and joint pain    History   Social History  . Marital Status: Married    Spouse Name: N/A    Number of Children: 3  . Years of Education: ASSOCIATES   Occupational History  . Not on file.   Social History Main Topics  . Smoking status: Former Smoker -- 0.50 packs/day for 7 years    Types: Cigarettes  . Smokeless tobacco: Never Used     Comment: 09/04/2013 "quit smoking cigarettes ~ age [age]"  . Alcohol Use: No  . Drug Use: No  . Sexual Activity: Not Currently   Other Topics Concern  . Not on file   Social History Narrative   Patient is married with 3 children.   Patient is right handed.   Patient has his Associates degree.   Patient drinks 2-3 cups daily.     Review of Systems: General: negative for chills, fever, night sweats or weight changes.  Cardiovascular: negative for chest pain, dyspnea on exertion, edema, orthopnea, palpitations, paroxysmal nocturnal dyspnea or shortness of breath Dermatological: negative for rash Respiratory: negative for cough or wheezing Urologic: negative for hematuria Abdominal: negative for nausea, vomiting, diarrhea, bright red blood per rectum, melena, or hematemesis Neurologic: negative for visual changes, syncope, or dizziness All other systems reviewed and are otherwise negative except as noted above.    Blood pressure 158/75, pulse 57, height 5\' 8"  (1.727 m), weight 188 lb 9.6 oz (85.548 kg).  General appearance: alert and no distress Neck: no adenopathy, no carotid bruit, no JVD, supple, symmetrical, trachea midline and thyroid not enlarged, symmetric, no tenderness/mass/nodules Lungs: clear to auscultation bilaterally Heart: regular rate and rhythm, S1, S2 normal, no  murmur, click, rub or gallop Extremities: extremities normal, atraumatic, no cyanosis or edema  EKG not performed today  ASSESSMENT AND PLAN:   STEMI (ST elevation myocardial infarction) History status post anterior STEMI 4//5/15 treated with PCI and stenting of his LAD. He is on dual NF therapy. He denies chest pain or shortness of breath   PVD (peripheral vascular disease) History of peripheral arterial disease status post diamondback orbital rotational atherectomy of a high grade calcified ostial right common iliac artery stenosis in April of last year with recanalization of an occluded left common iliac in July. His most recent Dopplers are completely normal and he denies claudication.   Hyperlipidemia History of hyperlipidemia on pravastatin 20 mg a day with his most recent lipid profile performed 03/27/14 revealing a total cholesterol 134, LDL 71 and HDL of 24   Essential hypertension History of hypertension blood pressure measures 150/75. He is on amlodipine, lisinopril and metoprolol. Continue current meds at current dosing   Carotid occlusion, left History of occluded left common carotid artery with recent  watershed infarct left brain characterized by weakness and numbness in his right arm and leg thought to be related to a hemodynamically significant stenosis in the right carotid artery. He was evaluated by Dr.Xu at [facility] I spoke to personally suggesting the patient have angiography and potential stenting in the event that he is found to have a stenosis of greater than 70%. He would be high risk for endarterectomy given contralateral occlusion and the fact that he is on dual antiplatelet therapy with recent LAD stent.       Lorretta Harp MD FACP,FACC,FAHA, [facility] 04/13/2014 12:09 PM

## 2014-04-13 NOTE — Assessment & Plan Note (Signed)
History of peripheral arterial disease status post diamondback orbital rotational atherectomy of a high grade calcified ostial right common iliac artery stenosis in April of last year with recanalization of an occluded left common iliac in July. His most recent Dopplers are completely normal and he denies claudication.

## 2014-04-13 NOTE — Patient Instructions (Signed)
[name] has ordered a peripheral angiogram (carotid stent with Dr Trula Slade on a Thursday) to be done at [facility].  This procedure is going to look at the bloodflow in your lower extremities.  If [name] is able to open up the arteries, you will have to spend one night in the hospital.  If he is not able to open the arteries, you will be able to go home that same day.    After the procedure, you will not be allowed to drive for 3 days or push, pull, or lift anything greater than 10 lbs for one week.    You will be required to have the following tests prior to the procedure:  1. Blood work-the blood work can be done no more than 7 days prior to the procedure.  It can be done at any [facility] lab.  There is one downstairs on the first floor of this building and one in the [place] ([address])

## 2014-04-13 NOTE — Assessment & Plan Note (Signed)
History status post anterior STEMI 4//5/15 treated with PCI and stenting of his LAD. He is on dual NF therapy. He denies chest pain or shortness of breath

## 2014-04-13 NOTE — Assessment & Plan Note (Signed)
History of occluded left common carotid artery with recent watershed infarct left brain characterized by weakness and numbness in his right arm and leg thought to be related to a hemodynamically significant stenosis in the right carotid artery. He was evaluated by Dr.Xu at [facility] I spoke to personally suggesting the patient have angiography and potential stenting in the event that he is found to have a stenosis of greater than 70%. He would be high risk for endarterectomy given contralateral occlusion and the fact that he is on dual antiplatelet therapy with recent LAD stent.

## 2014-04-18 ENCOUNTER — Encounter: Payer: Self-pay | Admitting: Cardiovascular Disease

## 2014-04-18 ENCOUNTER — Ambulatory Visit (INDEPENDENT_AMBULATORY_CARE_PROVIDER_SITE_OTHER): Payer: PPO

## 2014-04-18 ENCOUNTER — Telehealth: Payer: Self-pay | Admitting: Cardiovascular Disease

## 2014-04-18 DIAGNOSIS — I63231 Cerebral infarction due to unspecified occlusion or stenosis of right carotid arteries: Secondary | ICD-10-CM

## 2014-04-18 NOTE — Telephone Encounter (Signed)
Called and spoke with [name] [name] regarding procedure to be done on Tuesday 05/15/14 at 7:30 am.  I informed patient's wife [name] [name] should arrive at the [place] at [facility] at 6:00 am for a 7:30am procedure.  NPA after midnight, except for morning medications.  Plan for an overnight stay.  [name] [name] stated they were in a hurry and I told her I would mail the instructions to her and her husband.  She voiced her understanding.

## 2014-04-18 NOTE — Telephone Encounter (Signed)
Called and spoke with Louis @ 10:21 am regarding carotid stent scheduled for this patient on 05/15/14 @ 7:30 am.  Luiz Ochoa stated he will be there.

## 2014-04-23 ENCOUNTER — Telehealth: Payer: Self-pay | Admitting: Family Medicine

## 2014-04-23 DIAGNOSIS — I25119 Atherosclerotic heart disease of native coronary artery with unspecified angina pectoris: Secondary | ICD-10-CM

## 2014-04-23 MED ORDER — METOPROLOL TARTRATE 50 MG PO TABS: 50.0000 mg | ORAL_TABLET | Freq: Two times a day (BID) | ORAL | Status: DC

## 2014-04-23 MED ORDER — LISINOPRIL 10 MG PO TABS
10.0000 mg | ORAL_TABLET | Freq: Every day | ORAL | Status: DC
Start: 2014-04-23 — End: 2014-06-19

## 2014-04-23 MED ORDER — AMLODIPINE BESYLATE 10 MG PO TABS: 10.0000 mg | ORAL_TABLET | Freq: Every day | ORAL | Status: DC

## 2014-04-23 MED ORDER — PRAVASTATIN SODIUM 20 MG PO TABS: 20.0000 mg | ORAL_TABLET | Freq: Every day | ORAL | Status: DC

## 2014-04-23 MED ORDER — TRAMADOL HCL 50 MG PO TABS: 50.0000 mg | ORAL_TABLET | Freq: Three times a day (TID) | ORAL | Status: DC

## 2014-04-23 MED ORDER — CLOPIDOGREL BISULFATE 75 MG PO TABS: 75.0000 mg | ORAL_TABLET | Freq: Every day | ORAL | Status: DC

## 2014-04-23 NOTE — Telephone Encounter (Signed)
Pt needs refills on Tramadol, Clopidogrel, Lisinopril, Amlodipine, Metoprolol, Pravastatin  Please send to [facility] order

## 2014-04-23 NOTE — Telephone Encounter (Signed)
This pt may have 90 day and 1 additional refill on all non controlled

## 2014-04-23 NOTE — Telephone Encounter (Signed)
Notified Pamala Hurry (wife) that medications has been sent in.

## 2014-04-23 NOTE — Telephone Encounter (Signed)
Last seen 1.6.16.

## 2014-05-01 ENCOUNTER — Other Ambulatory Visit: Payer: Self-pay | Admitting: *Deleted

## 2014-05-01 MED ORDER — TRAMADOL HCL 50 MG PO TABS
50.0000 mg | ORAL_TABLET | Freq: Three times a day (TID) | ORAL | Status: DC
Start: 2014-05-01 — End: 2014-10-17

## 2014-05-09 ENCOUNTER — Encounter (HOSPITAL_COMMUNITY): Payer: Self-pay | Admitting: Pharmacy Technician

## 2014-05-09 LAB — BASIC METABOLIC PANEL
BUN: 12 mg/dL (ref 6–23)
CO2: 27 meq/L (ref 19–32)
CREATININE: 0.95 mg/dL (ref 0.50–1.35)
Calcium: 9 mg/dL (ref 8.4–10.5)
Chloride: 105 mEq/L (ref 96–112)
Glucose, Bld: 94 mg/dL (ref 70–99)
Potassium: 4.8 mEq/L (ref 3.5–5.3)
Sodium: 142 mEq/L (ref 135–145)

## 2014-05-09 LAB — CBC
HCT: 36.1 % — ABNORMAL LOW (ref 39.0–52.0)
Hemoglobin: 10.7 g/dL — ABNORMAL LOW (ref 13.0–17.0)
MCH: 21 pg — AB (ref 26.0–34.0)
MCHC: 29.6 g/dL — AB (ref 30.0–36.0)
MCV: 70.8 fL — AB (ref 78.0–100.0)
PLATELETS: 211 10*3/uL (ref 150–400)
RBC: 5.1 MIL/uL (ref 4.22–5.81)
RDW: 18.2 % — ABNORMAL HIGH (ref 11.5–15.5)
WBC: 7.4 10*3/uL (ref 4.0–10.5)

## 2014-05-09 LAB — PROTIME-INR
INR: 1.02 (ref <1.50)
Prothrombin Time: 13.4 seconds (ref 11.6–15.2)

## 2014-05-09 LAB — APTT: aPTT: 29 seconds (ref 24–37)

## 2014-05-09 LAB — TSH: TSH: 1.893 u[IU]/mL (ref 0.350–4.500)

## 2014-05-14 ENCOUNTER — Encounter: Payer: Self-pay | Admitting: *Deleted

## 2014-05-15 ENCOUNTER — Encounter (HOSPITAL_COMMUNITY)
Admission: RE | Disposition: A | Discharge status: Home / Self Care | Payer: Self-pay | Source: Ambulatory Visit | Attending: Cardiovascular Disease

## 2014-05-15 ENCOUNTER — Ambulatory Visit (HOSPITAL_COMMUNITY)
Admission: RE | Admit: 2014-05-15 | Discharge: 2014-05-15 | Disposition: A | Discharge status: Home / Self Care | Payer: PPO | Source: Ambulatory Visit | Attending: Cardiovascular Disease | Admitting: Cardiovascular Disease

## 2014-05-15 DIAGNOSIS — E785 Hyperlipidemia, unspecified: Secondary | ICD-10-CM

## 2014-05-15 DIAGNOSIS — I10 Essential (primary) hypertension: Secondary | ICD-10-CM | POA: Diagnosis not present

## 2014-05-15 DIAGNOSIS — I252 Old myocardial infarction: Secondary | ICD-10-CM | POA: Insufficient documentation

## 2014-05-15 DIAGNOSIS — I6521 Occlusion and stenosis of right carotid artery: Secondary | ICD-10-CM | POA: Insufficient documentation

## 2014-05-15 DIAGNOSIS — Z7982 Long term (current) use of aspirin: Secondary | ICD-10-CM | POA: Diagnosis not present

## 2014-05-15 DIAGNOSIS — Z8673 Personal history of transient ischemic attack (TIA), and cerebral infarction without residual deficits: Secondary | ICD-10-CM | POA: Insufficient documentation

## 2014-05-15 DIAGNOSIS — I70201 Unspecified atherosclerosis of native arteries of extremities, right leg: Secondary | ICD-10-CM | POA: Diagnosis not present

## 2014-05-15 DIAGNOSIS — Z955 Presence of coronary angioplasty implant and graft: Secondary | ICD-10-CM | POA: Insufficient documentation

## 2014-05-15 DIAGNOSIS — Z7902 Long term (current) use of antithrombotics/antiplatelets: Secondary | ICD-10-CM | POA: Diagnosis not present

## 2014-05-15 DIAGNOSIS — I63231 Cerebral infarction due to unspecified occlusion or stenosis of right carotid arteries: Secondary | ICD-10-CM | POA: Diagnosis present

## 2014-05-15 DIAGNOSIS — I714 Abdominal aortic aneurysm, without rupture: Secondary | ICD-10-CM | POA: Diagnosis not present

## 2014-05-15 DIAGNOSIS — I2102 ST elevation (STEMI) myocardial infarction involving left anterior descending coronary artery: Secondary | ICD-10-CM

## 2014-05-15 DIAGNOSIS — Z79899 Other long term (current) drug therapy: Secondary | ICD-10-CM

## 2014-05-15 DIAGNOSIS — Z87891 Personal history of nicotine dependence: Secondary | ICD-10-CM | POA: Insufficient documentation

## 2014-05-15 DIAGNOSIS — D689 Coagulation defect, unspecified: Secondary | ICD-10-CM

## 2014-05-15 DIAGNOSIS — I6522 Occlusion and stenosis of left carotid artery: Secondary | ICD-10-CM

## 2014-05-15 DIAGNOSIS — I6529 Occlusion and stenosis of unspecified carotid artery: Secondary | ICD-10-CM

## 2014-05-15 DIAGNOSIS — I739 Peripheral vascular disease, unspecified: Secondary | ICD-10-CM

## 2014-05-15 SURGERY — CAROTID ANGIOGRAM
Laterality: Right

## 2014-05-15 MED ORDER — SODIUM CHLORIDE 0.9 % IV SOLN
INTRAVENOUS | Status: DC
  Administered 2014-05-15: 07:00:00 via INTRAVENOUS

## 2014-05-15 MED ORDER — LIDOCAINE HCL (PF) 1 % IJ SOLN
INTRAMUSCULAR | Status: AC
  Filled 2014-05-15: qty 30

## 2014-05-15 MED ORDER — SODIUM CHLORIDE 0.9 % IJ SOLN: 3.0000 mL | INTRAMUSCULAR | Status: DC | PRN

## 2014-05-15 MED ORDER — HEPARIN (PORCINE) IN NACL 2-0.9 UNIT/ML-% IJ SOLN
INTRAMUSCULAR | Status: AC
  Filled 2014-05-15: qty 1000

## 2014-05-15 MED ORDER — CLOPIDOGREL BISULFATE 75 MG PO TABS: 75.0000 mg | ORAL_TABLET | Freq: Every day | ORAL | Status: DC

## 2014-05-15 MED ORDER — ACETAMINOPHEN 325 MG PO TABS
ORAL_TABLET | ORAL | Status: AC
  Filled 2014-05-15: qty 2

## 2014-05-15 MED ORDER — ACETAMINOPHEN 325 MG PO TABS
650.0000 mg | ORAL_TABLET | ORAL | Status: DC | PRN
  Administered 2014-05-15: 650 mg via ORAL

## 2014-05-15 MED ORDER — ONDANSETRON HCL 4 MG/2ML IJ SOLN: 4.0000 mg | Freq: Four times a day (QID) | INTRAMUSCULAR | Status: DC | PRN

## 2014-05-15 MED ORDER — ASPIRIN EC 325 MG PO TBEC: 325.0000 mg | DELAYED_RELEASE_TABLET | Freq: Every day | ORAL | Status: DC

## 2014-05-15 MED ORDER — ASPIRIN 81 MG PO CHEW: 81.0000 mg | CHEWABLE_TABLET | ORAL | Status: DC

## 2014-05-15 MED ORDER — SODIUM CHLORIDE 0.9 % IV SOLN: INTRAVENOUS | Status: AC

## 2014-05-15 NOTE — Discharge Instructions (Signed)

## 2014-05-15 NOTE — Progress Notes (Signed)
Site area: right groin a 5 french sheath was removed  Site Prior to Removal:  Level 0  Pressure Applied For 20 MINUTES    Minutes Beginning at 0835a  Manual:   Yes.    Patient Status During Pull:  stable  Post Pull Groin Site:  Level 0  Post Pull Instructions Given:  Yes.    Post Pull Pulses Present:  Yes.    Dressing Applied:  Yes.    Comments:  Pt  VS remain stable during sheath pull.  Pt denies any discomfort at this time

## 2014-05-15 NOTE — H&P (Signed)
JILL RUPPE  Jun 30, 1934  [id number]  Primary Physician Sallee Lange, MD Primary Cardiologist: Lorretta Harp MD [place], [name]   HPI: [name] [name] is a 80y.o. 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 I last saw him 6 months ago. He was admitted on 07/02/13 for Anterior STEMI. Presenting symptoms included severe epigastric burning pain/ indigestion and left arm pain. Initial troponin was > 20.0. He underwent emergent LHC and successful aspiration thrombectomy, PCI and stenting of proximal LAD by [name], via the right femoral artery . The overall LVEF was estimated at 50%, without wall motion abnormalities. He tolerated the procedure well and left the cath lab in stable condition. DAPT with ASA and Brilinta was initiated. He was also placed on a BB, ACE-I and statin. His post PCI course was w/o complication. He was discharged home on 07/05/13.  It should also be noted that at time of emergent LHC, he was found to also have significant peripheral vascular disease with an occluded left iliac and high-grade right common iliac artery stenosis, as well as a small AAA. The patient complained of bilateral LEE. [name] ordered for him to undergo bilateral LEAs. The study was performed in our office on 07/18/13. Findings are as follows: ABI 0.95 on Rt, 0.59 on Lt.the patient underwent angiography, diamondback orbital rotational arthrectomy, PTA and stenting of a highly calcified ostial right common iliac artery stenosis. He had excellent angiographic and clinical result. His Dopplers normalized to no longer has right lower extremity claudication. He wishes to proceed with attempt at percutaneous opacification of his left common iliac chronic total occlusion. He is not tolerating his Brilenta because of shortness of breath and we subsequently transitioned to Plavix. He recently had a left brain stroke thought to be  related to a watershed infarct from a hemodynamically significant right carotid stenosis in the setting of an occluded left carotid. He was evaluated by Dr. Erlinda Hong from [facility]. The patient will be a candidate for right carotid stenting should be found that he had a 70% or greater hemodynamically significant lesion.   Current Outpatient Prescriptions  Medication Sig Dispense Refill  . acetaminophen (TYLENOL) 500 MG tablet Take 500 mg by mouth every 6 (six) hours as needed for mild pain or moderate pain.     [name] amLODipine (NORVASC) 10 MG tablet Take 1 tablet (10 mg total) by mouth daily. 90 tablet 1  . aspirin 81 MG tablet Take 81 mg by mouth daily. Taking 1/2 of 81 mg tablet    . clopidogrel (PLAVIX) 75 MG tablet Take 1 tablet (75 mg total) by mouth daily. 30 tablet 0  . lisinopril (PRINIVIL,ZESTRIL) 10 MG tablet Take 1 tablet (10 mg total) by mouth daily. 90 tablet 3  . methylcellulose (ARTIFICIAL TEARS) 1 % ophthalmic solution Place 1 drop into both eyes as needed (for dry eyes).    . metoprolol (LOPRESSOR) 50 MG tablet TAKE 1 TABLET TWICE DAILY (Patient taking differently: TAKE 1 TABLET TWICE DAILY) 180 tablet 1  . mometasone (ELOCON) 0.1 % cream Apply to affected area daily (Patient taking differently: Apply 1 application topically daily. Apply to affected area daily) 45 g 1  . nitroGLYCERIN (NITROSTAT) 0.4 MG SL tablet Place 1 tablet (0.4 mg total) under the tongue every 5 (five) minutes as needed for chest pain. 25 tablet 2  . pravastatin (PRAVACHOL) 20 MG tablet Take 1 tablet (20 mg total) by mouth  daily. 30 tablet 0  . traMADol (ULTRAM) 50 MG tablet Take 1 tablet (50 mg total) by mouth 3 (three) times daily. 90 tablet 5  . vitamin B-12 (CYANOCOBALAMIN) 1000 MCG tablet Take 1 tablet (1,000 mcg total) by mouth daily. 90 tablet 3   No current facility-administered medications for this visit.     Allergies  Allergen Reactions  . Brilinta [Ticagrelor] Shortness Of Breath    SOB  . Influenza Vaccines     Pt states he has been hospitalized both times he was given flu vaccine as a younger adult while in the [employer] and they told him not to take it again  . Lipitor [Atorvastatin] Other (See Comments)    Muscle and joint pain    History   Social History  . Marital Status: Married    Spouse Name: N/A    Number of Children: 3  . Years of Education: ASSOCIATES   Occupational History  . Not on file.   Social History Main Topics  . Smoking status: Former Smoker -- 0.50 packs/day for 7 years    Types: Cigarettes  . Smokeless tobacco: Never Used     Comment: 09/04/2013 "quit smoking cigarettes ~ age [age]"  . Alcohol Use: No  . Drug Use: No  . Sexual Activity: Not Currently   Other Topics Concern  . Not on file   Social History Narrative   Patient is married with 3 children.   Patient is right handed.   Patient has his Associates degree.   Patient drinks 2-3 cups daily.     Review of Systems: General: negative for chills, fever, night sweats or weight changes.  Cardiovascular: negative for chest pain, dyspnea on exertion, edema, orthopnea, palpitations, paroxysmal nocturnal dyspnea or shortness of breath Dermatological: negative for rash Respiratory: negative for cough or wheezing Urologic: negative for hematuria Abdominal: negative for nausea, vomiting, diarrhea, bright red blood per rectum, melena, or hematemesis Neurologic: negative for visual changes, syncope, or dizziness All other systems reviewed and are otherwise negative except as noted above.    Blood pressure 158/75, pulse 57, height 5\' 8"  (1.727 m), weight 188 lb 9.6 oz (85.548 kg).  General appearance: alert and no distress Neck: no adenopathy, no carotid bruit, no JVD, supple, symmetrical, trachea midline and  thyroid not enlarged, symmetric, no tenderness/mass/nodules Lungs: clear to auscultation bilaterally Heart: regular rate and rhythm, S1, S2 normal, no murmur, click, rub or gallop Extremities: extremities normal, atraumatic, no cyanosis or edema  EKG not performed today  ASSESSMENT AND PLAN:   STEMI (ST elevation myocardial infarction) History status post anterior STEMI 4//5/15 treated with PCI and stenting of his LAD. He is on dual NF therapy. He denies chest pain or shortness of breath   PVD (peripheral vascular disease) History of peripheral arterial disease status post diamondback orbital rotational atherectomy of a high grade calcified ostial right common iliac artery stenosis in April of last year with recanalization of an occluded left common iliac in July. His most recent Dopplers are completely normal and he denies claudication.   Hyperlipidemia History of hyperlipidemia on pravastatin 20 mg a day with his most recent lipid profile performed 03/27/14 revealing a total cholesterol 134, LDL 71 and HDL of 24   Essential hypertension History of hypertension blood pressure measures 150/75. He is on amlodipine, lisinopril and metoprolol. Continue current meds at current dosing   Carotid occlusion, left History of occluded left common carotid artery with recent watershed infarct left brain characterized by weakness and  numbness in his right arm and leg thought to be related to a hemodynamically significant stenosis in the right carotid artery. He was evaluated by Dr.Xu at [facility] I spoke to personally suggesting the patient have angiography and potential stenting in the event that he is found to have a stenosis of greater than 70%. He would be high risk for endarterectomy given contralateral occlusion and the fact that he is on dual antiplatelet therapy with recent LAD stent.       Lorretta Harp MD FACP,FACC,FAHA, FSCAI  H & P will be scanned in.  Pt  was reexamined and existing H & P reviewed. No changes found.  Lorretta Harp, MD [facility] 05/15/2014 7:09 AM

## 2014-05-15 NOTE — CV Procedure (Signed)
[name] [name] is a 80 y.o. male    [id number] LOCATION:  FACILITY: [place]  PHYSICIAN: Quay Burow, M.D. Mar 23, 1935   DATE OF PROCEDURE:  05/15/2014  DATE OF DISCHARGE:     PV Angiogram/Intervention    History obtained from chart review.[name] [name] is a 79y.o. 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 I last saw him 6 months ago. He was admitted on 07/02/13 for Anterior STEMI. Presenting symptoms included severe epigastric burning pain/ indigestion and left arm pain. Initial troponin was > 20.0. He underwent emergent LHC and successful aspiration thrombectomy, PCI and stenting of proximal LAD by [name], via the right femoral artery . The overall LVEF was estimated at 50%, without wall motion abnormalities. He tolerated the procedure well and left the cath lab in stable condition. DAPT with ASA and Brilinta was initiated. He was also placed on a BB, ACE-I and statin. His post PCI course was w/o complication. He was discharged home on 07/05/13.  It should also be noted that at time of emergent LHC, he was found to also have significant peripheral vascular disease with an occluded left iliac and high-grade right common iliac artery stenosis, as well as a small AAA. The patient complained of bilateral LEE. [name] ordered for him to undergo bilateral LEAs. The study was performed in our office on 07/18/13. Findings are as follows: ABI 0.95 on Rt, 0.59 on Lt.the patient underwent angiography, diamondback orbital rotational arthrectomy, PTA and stenting of a highly calcified ostial right common iliac artery stenosis. He had excellent angiographic and clinical result. His Dopplers normalized to no longer has right lower extremity claudication. He wishes to proceed with attempt at percutaneous opacification of his left common iliac chronic total occlusion. He is not tolerating his Brilenta because of shortness of breath and  we subsequently transitioned to Plavix. He recently had a left brain stroke thought to be related to a watershed infarct from a hemodynamically significant right carotid stenosis in the setting of an occluded left carotid. He was evaluated by Dr. Erlinda Hong from [facility]. The patient will be a candidate for right carotid stenting should be found that he had a 70% or greater hemodynamically significant lesion. He presents now for angiography and potential intervention.   PROCEDURE DESCRIPTION:   The patient was brought to the second floor [facility] cath lab in the postabsorptive state. He was not Premedicated.[name] His right groinwas prepped and shaved in usual sterile fashion. Xylocaine 1% was used for local anesthesia. A 6 French sheath was inserted into the right common femoral artery using standard Seldinger technique. A 5 French pigtail catheter was used for aortic arch angiography. A J-B 1 catheter was used to cannulate the right innominate artery and a selective right innominate and then was performed. A Berenstein catheter was used to cannulate the origin of the right common carotid artery. Extracranial right carotid angiography performed in oblique views. Visipaque dye was used for the entirety of the case. Retrograde aortic pressure was monitored during the case.   HEMODYNAMICS:    AO SYSTOLIC/AO DIASTOLIC: 440/34   Angiographic Data:   1: Aortic arch angiogram-type II aortic arch  2: Right carotid angiogram-there was a 40-50% smooth, fluoroscopically calcified, distal common/proximal right internal carotid artery stenosis.  IMPRESSION:[name] has moderate right internal carotid artery stenosis. I do not think this appears hemodynamically significant angiographically. This would be a difficult carotid artery stent  case because of the tortuosity of the innominate and takeoff of the right common carotid. Continue medical therapy will be recommended with dual  antiplatelet therapy. The sheath was removed and pressure held on the groin to achieve hemostasis. The patient left the lab in stable condition. He will be hydrated for the next 4 hours and discharged home. I will see him back in the office in 2-3 weeks for follow-up. I have reviewed these angiograms with Dr. Annamarie Major who was present during the procedure.    Lorretta Harp MD, [facility] 05/15/2014 8:21 AM

## 2014-06-04 ENCOUNTER — Ambulatory Visit (INDEPENDENT_AMBULATORY_CARE_PROVIDER_SITE_OTHER): Payer: PPO | Admitting: Family Medicine

## 2014-06-04 ENCOUNTER — Encounter: Payer: Self-pay | Admitting: Family Medicine

## 2014-06-04 VITALS — BP 138/88 | Ht 68.0 in | Wt 185.0 lb

## 2014-06-04 DIAGNOSIS — R5383 Other fatigue: Secondary | ICD-10-CM

## 2014-06-04 DIAGNOSIS — E538 Deficiency of other specified B group vitamins: Secondary | ICD-10-CM | POA: Diagnosis not present

## 2014-06-04 DIAGNOSIS — I10 Essential (primary) hypertension: Secondary | ICD-10-CM

## 2014-06-04 DIAGNOSIS — R42 Dizziness and giddiness: Secondary | ICD-10-CM | POA: Diagnosis not present

## 2014-06-04 DIAGNOSIS — D509 Iron deficiency anemia, unspecified: Secondary | ICD-10-CM

## 2014-06-04 NOTE — Progress Notes (Signed)
   Subjective:    Patient ID: [name] [name], male    DOB: [birth year], 80 y.o.   MRN: [id number]  HPI Patient is here today for a check up.  Pt c/o dizziness in the morning. The dizziness starts when he takes his meds after breakfast. After he takes his nap, the dizziness goes away. This has been going on for a year. He noticed that this is more so when he stands up he also states sometimes the last few minutes sometimes a last all day he denies any chest pressure tightness soreness or shortness of breath.   He denies any rectal bleeding he has taken aspirin and Plavix.  Pt also c/o fatigue. Pt said he is not taking his vit 12, I told him that it may help with the fatigue.  His B12 in the hospital with low normal.  No other concerns.   25 minutes spent with the patient discussing the dizziness he is having discussing the fatigue tiredness his recent testing and his medication usage. Greater than half amount of time was spent in answering questions.  Review of Systems  Constitutional: Negative for activity change, appetite change and fatigue.  HENT: Negative for congestion.   Respiratory: Negative for cough.   Cardiovascular: Negative for chest pain.  Gastrointestinal: Negative for abdominal pain.  Endocrine: Negative for polydipsia and polyphagia.  Neurological: Positive for dizziness. Negative for weakness.  Psychiatric/Behavioral: Negative for confusion.       Objective:   Physical Exam  Constitutional: He appears well-nourished. No distress.  Cardiovascular: Normal rate, regular rhythm and normal heart sounds.   No murmur heard. Pulmonary/Chest: Effort normal and breath sounds normal. No respiratory distress.  Musculoskeletal: He exhibits no edema.  Lymphadenopathy:    He has no cervical adenopathy.  Neurological: He is alert.  Psychiatric: His behavior is normal.  Vitals reviewed.         Assessment & Plan:   orthostatics/dizziness-blood pressure taken laying  sitting standing does not show appreciable change at the diastolic does drop some. The medications patient is on is necessary to help keep blood pressure under good control. I would not recommend changing this currently.   fatigue-patient relates a lot of fatigue tiredness with activity feeling run down. I believe that this in multi-factorial age, underlying diseases, anemia, deconditioning.  Anemia probable iron deficient anemia -he will need some lab work TIBC, ferritin, CBC and Hemoccult cards. May need referral back to [name]  For colonoscopy.   Patient had low normal B12 when he was in the hospital couple months, recommend 1000 g daily on B12.   Additional testing as determined by what we find

## 2014-06-04 NOTE — Patient Instructions (Signed)
Start B 12  1,000 mcg daily  Do your labs and heme occult cards soon

## 2014-06-05 ENCOUNTER — Other Ambulatory Visit: Payer: Self-pay

## 2014-06-05 DIAGNOSIS — D509 Iron deficiency anemia, unspecified: Secondary | ICD-10-CM

## 2014-06-05 LAB — IRON AND TIBC
IRON: 26 ug/dL — AB (ref 38–169)
Iron Saturation: 6 % — CL (ref 15–55)
Total Iron Binding Capacity: 406 ug/dL (ref 250–450)
UIBC: 380 ug/dL — AB (ref 111–343)

## 2014-06-05 LAB — CBC WITH DIFFERENTIAL/PLATELET
BASOS: 1 %
Basophils Absolute: 0.1 10*3/uL (ref 0.0–0.2)
EOS: 4 %
Eosinophils Absolute: 0.4 10*3/uL (ref 0.0–0.4)
HEMATOCRIT: 37.4 % — AB (ref 37.5–51.0)
Hemoglobin: 11.1 g/dL — ABNORMAL LOW (ref 12.6–17.7)
IMMATURE GRANULOCYTES: 0 %
Immature Grans (Abs): 0 10*3/uL (ref 0.0–0.1)
LYMPHS ABS: 1.3 10*3/uL (ref 0.7–3.1)
Lymphs: 13 %
MCH: 21.5 pg — AB (ref 26.6–33.0)
MCHC: 29.7 g/dL — ABNORMAL LOW (ref 31.5–35.7)
MCV: 72 fL — AB (ref 79–97)
MONOCYTES: 8 %
MONOS ABS: 0.8 10*3/uL (ref 0.1–0.9)
NEUTROS ABS: 7.4 10*3/uL — AB (ref 1.4–7.0)
Neutrophils Relative %: 74 %
PLATELETS: 206 10*3/uL (ref 150–379)
RBC: 5.17 x10E6/uL (ref 4.14–5.80)
RDW: 17.4 % — ABNORMAL HIGH (ref 12.3–15.4)
WBC: 9.9 10*3/uL (ref 3.4–10.8)

## 2014-06-05 LAB — FERRITIN: FERRITIN: 12 ng/mL — AB (ref 30–400)

## 2014-06-07 ENCOUNTER — Encounter: Payer: Self-pay | Admitting: Internal Medicine

## 2014-06-08 ENCOUNTER — Ambulatory Visit: Payer: PPO | Admitting: Neurology

## 2014-06-08 ENCOUNTER — Other Ambulatory Visit (INDEPENDENT_AMBULATORY_CARE_PROVIDER_SITE_OTHER): Payer: PPO | Admitting: *Deleted

## 2014-06-08 DIAGNOSIS — D509 Iron deficiency anemia, unspecified: Secondary | ICD-10-CM | POA: Diagnosis not present

## 2014-06-08 LAB — POC HEMOCCULT BLD/STL (HOME/3-CARD/SCREEN)
Card #2 Fecal Occult Blod, POC: NEGATIVE
Card #3 Fecal Occult Blood, POC: NEGATIVE
FECAL OCCULT BLD: NEGATIVE

## 2014-06-19 ENCOUNTER — Encounter: Payer: Self-pay | Admitting: Cardiovascular Disease

## 2014-06-19 ENCOUNTER — Ambulatory Visit (INDEPENDENT_AMBULATORY_CARE_PROVIDER_SITE_OTHER): Payer: PPO | Admitting: Cardiovascular Disease

## 2014-06-19 VITALS — BP 162/64 | HR 64 | Ht 68.0 in | Wt 182.7 lb

## 2014-06-19 DIAGNOSIS — I739 Peripheral vascular disease, unspecified: Secondary | ICD-10-CM

## 2014-06-19 DIAGNOSIS — I10 Essential (primary) hypertension: Secondary | ICD-10-CM

## 2014-06-19 DIAGNOSIS — I714 Abdominal aortic aneurysm, without rupture, unspecified: Secondary | ICD-10-CM

## 2014-06-19 DIAGNOSIS — I63231 Cerebral infarction due to unspecified occlusion or stenosis of right carotid arteries: Secondary | ICD-10-CM

## 2014-06-19 DIAGNOSIS — E785 Hyperlipidemia, unspecified: Secondary | ICD-10-CM

## 2014-06-19 DIAGNOSIS — I2101 ST elevation (STEMI) myocardial infarction involving left main coronary artery: Secondary | ICD-10-CM

## 2014-06-19 NOTE — Assessment & Plan Note (Signed)
History of CVA thought to be a watershed infarct related to a high-grade right internal carotid artery stenosis and a concomitant occlusion of the left internal carotid artery. He did have a transesophageal echo performed by Dr. Harrington Challenger that did not show an embolic source. However, angiography performed on 05/15/14 revealed his right internal carotid artery to only be in the 40-50% range.

## 2014-06-19 NOTE — Assessment & Plan Note (Signed)
History of CAD status post anterior STEMI 07/02/13 He presented with severe epigastric/burning pain with left upper extremity radiation. I performed aspiration thrombectomy, PCI stenting of his LAD. The right femoral approach. His EF was in the 50% range. He denies chest pain or shortness of breath. He remains on dual antiplatelet therapy.

## 2014-06-19 NOTE — Assessment & Plan Note (Signed)
History of hyperlipidemia with most recent lipid profile performed 03/27/12 revealing a total cholesterol 134, LDL 71 HDL of 24 on pravastatin 20 mg a day.

## 2014-06-19 NOTE — Patient Instructions (Signed)
Your physician has requested that you have a lower or upper extremity arterial duplex. This test is an ultrasound of the arteries in the legs or arms. It looks at arterial blood flow in the legs and arms. Allow one hour for Lower and Upper Arterial scans. There are no restrictions or special instructions  Your physician has requested that you have a carotid duplex. This test is an ultrasound of the carotid arteries in your neck. It looks at blood flow through these arteries that supply the brain with blood. Allow one hour for this exam. There are no restrictions or special instructions.  We request that you follow-up in: 6 months with an extender and in 1 year with Dr Andria Rhein will receive a reminder letter in the mail two months in advance. If you don't receive a letter, please call our office to schedule the follow-up appointment.

## 2014-06-19 NOTE — Assessment & Plan Note (Signed)
History of hypertension with blood pressure measured at 162/64 over home his wife says his blood pressure runs in the 130/60 range. He did stop his lisinopril on his own but remains on amlodipine and metoprolol. Continue current meds at current dosing

## 2014-06-19 NOTE — Assessment & Plan Note (Signed)
History of coronary artery disease with known occlusion of his left internal carotid artery by duplex ultrasound with only moderate angiographically documented stenosis of his right performed 05/15/14. His right ICA was in the 40-50% range and smooth. I suspect his duplex overestimated the degree of stenosis to the contralateral occlusion. We'll continue to follow this noninvasively.

## 2014-06-19 NOTE — Assessment & Plan Note (Signed)
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 He is now able to walk without limitation or actually bold yesterday. His last lower extremity arterial Doppler study performed 10/27/13 revealed normal ABIs with widely patent stents. We'll continue to follow this every 6 months.

## 2014-06-19 NOTE — Progress Notes (Signed)
06/19/2014 [name] [name]   October 13, 1934  [id number]  Primary Physician Sallee Lange, MD Primary Cardiologist: Lorretta Harp MD Renae Gloss  . HPI:  [name] [name] is a 79y.o. 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 I last saw him 04/13/14. He was admitted on 07/02/13 for Anterior STEMI. Presenting symptoms included severe epigastric burning pain/ indigestion and left arm pain. Initial troponin was > 20.0. He underwent emergent LHC and successful aspiration thrombectomy, PCI and stenting of proximal LAD by [name], via the right femoral artery . The overall LVEF was estimated at 50%, without wall motion abnormalities. He tolerated the procedure well and left the cath lab in stable condition. DAPT with ASA and Brilinta was initiated. He was also placed on a BB, ACE-I and statin. His post PCI course was w/o complication. He was discharged home on 07/05/13.  It should also be noted that at time of emergent LHC, he was found to also have significant peripheral vascular disease with an occluded left iliac and high-grade right common iliac artery stenosis, as well as a small AAA. The patient complained of bilateral LEE. [name] ordered for him to undergo bilateral LEAs. The study was performed in our office on 07/18/13. Findings are as follows: ABI 0.95 on Rt, 0.59 on Lt.the patient underwent angiography, diamondback orbital rotational arthrectomy, PTA and stenting of a highly calcified ostial right common iliac artery stenosis. He had excellent angiographic and clinical result. His Dopplers normalized to no longer has right lower extremity claudication. He wishes to proceed with attempt at percutaneous opacification of his left common iliac chronic total occlusion. He is not tolerating his Brilenta because of shortness of breath and we subsequently transitioned to Plavix. He recently had a left brain stroke thought  to be related to a watershed infarct from a hemodynamically significant right carotid stenosis in the setting of an occluded left carotid. He was evaluated by Dr. Erlinda Hong from [facility]. Dr. Erlinda Hong thought he would be a candidate for carotid stenting should he be found have a right internal carotid artery stenosis of greater than 70%.I intubated him on 05/15/14 revealing at most a 40-50% smooth right internal artery stenosis. I suspect his device ultrasound overestimated the degree of stenosis because the contralateral occlusion. We'll continue to monitor her him noninvasively as an outpatient   Current Outpatient Prescriptions  Medication Sig Dispense Refill  . acetaminophen (TYLENOL) 500 MG tablet Take 500 mg by mouth every 6 (six) hours as needed for mild pain or moderate pain.     [name] amLODipine (NORVASC) 10 MG tablet Take 1 tablet (10 mg total) by mouth daily. 90 tablet 1  . aspirin 81 MG tablet Take 81 mg by mouth daily. Taking 1/2 of  81 mg tablet    . clopidogrel (PLAVIX) 75 MG tablet Take 1 tablet (75 mg total) by mouth daily. 90 tablet 1  . methylcellulose (ARTIFICIAL TEARS) 1 % ophthalmic solution Place 1 drop into both eyes as needed (for dry eyes).    . metoprolol (LOPRESSOR) 50 MG tablet Take 1 tablet (50 mg total) by mouth 2 (two) times daily. 180 tablet 1  . nitroGLYCERIN (NITROSTAT) 0.4 MG SL tablet Place 1 tablet (0.4 mg total) under the tongue every 5 (five) minutes as needed for chest pain. 25 tablet 2  . pravastatin (PRAVACHOL) 20 MG tablet Take 1 tablet (20 mg total) by mouth daily. 90 tablet  1  . traMADol (ULTRAM) 50 MG tablet Take 1 tablet (50 mg total) by mouth 3 (three) times daily. 90 tablet 5  . vitamin B-12 (CYANOCOBALAMIN) 1000 MCG tablet Take 1 tablet (1,000 mcg total) by mouth daily. 90 tablet 3   No current facility-administered medications for this visit.    Allergies  Allergen Reactions  . Brilinta [Ticagrelor] Shortness Of Breath    SOB  . Influenza  Vaccines     Pt states he has been hospitalized both times he was given flu vaccine as a younger adult while in the [employer] and they told him not to take it again  . Lipitor [Atorvastatin] Other (See Comments)    Muscle and joint pain    History   Social History  . Marital Status: Married    Spouse Name: N/A  . Number of Children: 3  . Years of Education: ASSOCIATES   Occupational History  . Not on file.   Social History Main Topics  . Smoking status: Former Smoker -- 0.50 packs/day for 7 years    Types: Cigarettes  . Smokeless tobacco: Never Used     Comment: 09/04/2013 "quit smoking cigarettes ~ age [age]"  . Alcohol Use: No  . Drug Use: No  . Sexual Activity: Not Currently   Other Topics Concern  . Not on file   Social History Narrative   Patient is married with 3 children.   Patient is right handed.   Patient has his Associates degree.   Patient drinks 2-3 cups daily.     Review of Systems: General: negative for chills, fever, night sweats or weight changes.  Cardiovascular: negative for chest pain, dyspnea on exertion, edema, orthopnea, palpitations, paroxysmal nocturnal dyspnea or shortness of breath Dermatological: negative for rash Respiratory: negative for cough or wheezing Urologic: negative for hematuria Abdominal: negative for nausea, vomiting, diarrhea, bright red blood per rectum, melena, or hematemesis Neurologic: negative for visual changes, syncope, or dizziness All other systems reviewed and are otherwise negative except as noted above.    Blood pressure 162/64, pulse 64, height 5\' 8"  (1.727 m), weight 182 lb 11.2 oz (82.872 kg).  General appearance: alert and no distress Neck: no adenopathy, no carotid bruit, no JVD, supple, symmetrical, trachea midline and thyroid not enlarged, symmetric, no tenderness/mass/nodules Lungs: clear to auscultation bilaterally Heart: regular rate and rhythm, S1, S2 normal, no murmur, click, rub or gallop Extremities:  extremities normal, atraumatic, no cyanosis or edema  EKG not performed today  ASSESSMENT AND PLAN:   STEMI (ST elevation myocardial infarction) History of CAD status post anterior STEMI 07/02/13 He presented with severe epigastric/burning pain with left upper extremity radiation. I performed aspiration thrombectomy, PCI stenting of his LAD. The right femoral approach. His EF was in the 50% range. He denies chest pain or shortness of breath. He remains on dual antiplatelet therapy.   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 He is now able to walk without limitation or actually bold yesterday. His last lower extremity arterial Doppler study performed 10/27/13 revealed normal ABIs with widely patent stents. We'll continue to follow this every 6 months.   Occlusion and stenosis of carotid artery with cerebral infarction History of coronary artery disease with known occlusion of his left internal carotid artery  by duplex ultrasound with only moderate angiographically documented stenosis of his right performed 05/15/14. His right ICA was in the 40-50% range and smooth. I suspect his duplex overestimated the degree of stenosis to the contralateral occlusion. We'll continue to follow this noninvasively.   Hyperlipidemia History of hyperlipidemia with most recent lipid profile performed 03/27/12 revealing a total cholesterol 134, LDL 71 HDL of 24 on pravastatin 20 mg a day.   Essential hypertension History of hypertension with blood pressure measured at 162/64 over home his wife says his blood pressure runs in the 130/60 range. He did stop his lisinopril on his own but remains on amlodipine and metoprolol. 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 He did have a transesophageal echo performed by Dr. Harrington Challenger that did not show an embolic source. However, angiography performed on 05/15/14 revealed his right internal carotid artery to only be in the 40-50% range.   AAA (abdominal aortic aneurysm) Abdominal ultrasound only revealed his aorta measured 2.4 x 2.4 cm. I do not think he has a abdominal aortic aneurysm       Lorretta Harp MD [facility], [facility] 06/19/2014 11:07 AM

## 2014-06-19 NOTE — Assessment & Plan Note (Signed)
Abdominal ultrasound only revealed his aorta measured 2.4 x 2.4 cm. I do not think he has a abdominal aortic aneurysm

## 2014-06-29 ENCOUNTER — Ambulatory Visit (HOSPITAL_COMMUNITY)
Admission: RE | Admit: 2014-06-29 | Discharge: 2014-06-29 | Disposition: A | Discharge status: Home / Self Care | Payer: PPO | Source: Ambulatory Visit | Attending: Cardiovascular Disease | Admitting: Cardiovascular Disease

## 2014-06-29 DIAGNOSIS — I739 Peripheral vascular disease, unspecified: Secondary | ICD-10-CM

## 2014-06-29 NOTE — Progress Notes (Signed)
Arterial Duplex Lower Ext. Completed. Jadaya Sommerfield, BS, RDMS, RVT  

## 2014-06-29 NOTE — Progress Notes (Signed)
Carotid Duplex Completed. Taffie Eckmann, BS, RDMS, RVT  

## 2014-07-18 ENCOUNTER — Ambulatory Visit (INDEPENDENT_AMBULATORY_CARE_PROVIDER_SITE_OTHER): Payer: PPO | Admitting: Family Medicine

## 2014-07-18 ENCOUNTER — Encounter: Payer: Self-pay | Admitting: Family Medicine

## 2014-07-18 VITALS — BP 138/80 | Ht 68.0 in | Wt 181.0 lb

## 2014-07-18 DIAGNOSIS — D649 Anemia, unspecified: Secondary | ICD-10-CM | POA: Diagnosis not present

## 2014-07-18 DIAGNOSIS — D509 Iron deficiency anemia, unspecified: Secondary | ICD-10-CM | POA: Diagnosis not present

## 2014-07-18 LAB — POCT HEMOGLOBIN: Hemoglobin: 11.7 g/dL — AB (ref 14.1–18.1)

## 2014-07-18 NOTE — Progress Notes (Signed)
   Subjective:    Patient ID: [name] [name], male    DOB: [birth year], 80 y.o.   MRN: [id number]  HPIFollow up on anemia. Hg today 11.7. Pt is eating liver, taking b12 and iron supplement.  Pt states no other concerns today.   Patient states he has an appointment with [name], but when I searched the system I find no evidence of this. We will go ahead and help set him up an appointment with iron deficient anemia does need to be looked into further he denies abdominal pain loss of weight denies appetite change  Review of Systems     Objective:   Physical Exam  Constitutional: He appears well-nourished. No distress.  Cardiovascular: Normal rate, regular rhythm and normal heart sounds.   No murmur heard. Pulmonary/Chest: Effort normal and breath sounds normal. No respiratory distress.  Musculoskeletal: He exhibits no edema.  Lymphadenopathy:    He has no cervical adenopathy.  Neurological: He is alert.  Psychiatric: His behavior is normal.  Vitals reviewed.         Assessment & Plan:  Patient with iron deficient anemia I believe this patient would benefit from on had seen gastroenterology. He reluctantly agrees. I don't find any evidence of any cancer going on currently but certainly that is some potential concern, he should follow-up for regular visit in approximately 4 months

## 2014-08-14 ENCOUNTER — Ambulatory Visit: Payer: PPO | Admitting: Internal Medicine

## 2014-10-10 ENCOUNTER — Other Ambulatory Visit: Payer: Self-pay | Admitting: *Deleted

## 2014-10-10 MED ORDER — CLOPIDOGREL BISULFATE 75 MG PO TABS: 75.0000 mg | ORAL_TABLET | Freq: Every day | ORAL | Status: DC

## 2014-10-10 MED ORDER — AMLODIPINE BESYLATE 10 MG PO TABS: 10.0000 mg | ORAL_TABLET | Freq: Every day | ORAL | Status: DC

## 2014-10-10 MED ORDER — METOPROLOL TARTRATE 50 MG PO TABS: 50.0000 mg | ORAL_TABLET | Freq: Two times a day (BID) | ORAL | Status: DC

## 2014-10-10 MED ORDER — PRAVASTATIN SODIUM 20 MG PO TABS: 20.0000 mg | ORAL_TABLET | Freq: Every day | ORAL | Status: DC

## 2014-10-17 ENCOUNTER — Telehealth: Payer: Self-pay | Admitting: Family Medicine

## 2014-10-17 MED ORDER — TRAMADOL HCL 50 MG PO TABS: 50.0000 mg | ORAL_TABLET | Freq: Three times a day (TID) | ORAL | Status: DC

## 2014-10-17 NOTE — Telephone Encounter (Signed)
traMADol (ULTRAM) 50 MG tablet  Pt needs a refill on this med sent to mail order Pharmacy

## 2014-10-17 NOTE — Telephone Encounter (Signed)
May have this +2 additional refills. May send to his mail order.

## 2014-10-17 NOTE — Telephone Encounter (Signed)
Rx faxed to pharmacy. Patient notified. 

## 2014-10-26 ENCOUNTER — Telehealth: Payer: Self-pay | Admitting: Family Medicine

## 2014-10-26 DIAGNOSIS — I10 Essential (primary) hypertension: Secondary | ICD-10-CM

## 2014-10-26 DIAGNOSIS — D509 Iron deficiency anemia, unspecified: Secondary | ICD-10-CM

## 2014-10-26 DIAGNOSIS — E785 Hyperlipidemia, unspecified: Secondary | ICD-10-CM

## 2014-10-26 DIAGNOSIS — R7303 Prediabetes: Secondary | ICD-10-CM

## 2014-10-26 NOTE — Telephone Encounter (Signed)
Lab orders for appt on 10/31/14  Last labs 05/09/14 TSH, Protime INR, CBC, BMP, APTT                 06/04/14   CBC, Iron & TIBC, Ferritin  Aware of [facility] call when ready

## 2014-10-26 NOTE — Telephone Encounter (Signed)
Like wife, lots of different tests, [place] to see to ck what he wants

## 2014-10-28 NOTE — Telephone Encounter (Signed)
CBC, metabolic 7, lipid, liver, hemoglobin A1c, ferritin-prediabetes, hyperlipidemia, hypertension, iron deficient anemia

## 2014-10-29 NOTE — Telephone Encounter (Signed)
Blood work orders placed in Epic. Patient notified. 

## 2014-10-31 ENCOUNTER — Other Ambulatory Visit: Payer: Self-pay

## 2014-10-31 DIAGNOSIS — E611 Iron deficiency: Secondary | ICD-10-CM

## 2014-10-31 LAB — LIPID PANEL
Chol/HDL Ratio: 3.5 ratio units (ref 0.0–5.0)
Cholesterol, Total: 101 mg/dL (ref 100–199)
HDL: 29 mg/dL — AB (ref >39)
LDL Calculated: 40 mg/dL (ref 0–99)
Triglycerides: 159 mg/dL — ABNORMAL HIGH (ref 0–149)
VLDL Cholesterol Cal: 32 mg/dL (ref 5–40)

## 2014-10-31 LAB — CBC WITH DIFFERENTIAL/PLATELET
BASOS: 0 %
Basophils Absolute: 0 10*3/uL (ref 0.0–0.2)
EOS (ABSOLUTE): 0.4 10*3/uL (ref 0.0–0.4)
Eos: 6 %
HEMATOCRIT: 43.2 % (ref 37.5–51.0)
Hemoglobin: 13.7 g/dL (ref 12.6–17.7)
Immature Grans (Abs): 0 10*3/uL (ref 0.0–0.1)
Immature Granulocytes: 0 %
LYMPHS ABS: 1.4 10*3/uL (ref 0.7–3.1)
LYMPHS: 18 %
MCH: 24.7 pg — ABNORMAL LOW (ref 26.6–33.0)
MCHC: 31.7 g/dL (ref 31.5–35.7)
MCV: 78 fL — ABNORMAL LOW (ref 79–97)
Monocytes Absolute: 0.6 10*3/uL (ref 0.1–0.9)
Monocytes: 8 %
NEUTROS ABS: 5.1 10*3/uL (ref 1.4–7.0)
Neutrophils: 68 %
Platelets: 124 10*3/uL — ABNORMAL LOW (ref 150–379)
RBC: 5.54 x10E6/uL (ref 4.14–5.80)
RDW: 17.7 % — ABNORMAL HIGH (ref 12.3–15.4)
WBC: 7.6 10*3/uL (ref 3.4–10.8)

## 2014-10-31 LAB — BASIC METABOLIC PANEL
BUN/Creatinine Ratio: 17 (ref 10–22)
BUN: 16 mg/dL (ref 8–27)
CALCIUM: 9.3 mg/dL (ref 8.6–10.2)
CO2: 24 mmol/L (ref 18–29)
CREATININE: 0.96 mg/dL (ref 0.76–1.27)
Chloride: 99 mmol/L (ref 97–108)
GFR calc Af Amer: 88 mL/min/{1.73_m2} (ref >59)
GFR, EST NON AFRICAN AMERICAN: 76 mL/min/{1.73_m2} (ref >59)
Glucose: 116 mg/dL — ABNORMAL HIGH (ref 65–99)
Potassium: 4.4 mmol/L (ref 3.5–5.2)
Sodium: 138 mmol/L (ref 134–144)

## 2014-10-31 LAB — HEPATIC FUNCTION PANEL
ALK PHOS: 61 IU/L (ref 39–117)
ALT: 11 IU/L (ref 0–44)
AST: 17 IU/L (ref 0–40)
Albumin: 4.1 g/dL (ref 3.5–4.8)
BILIRUBIN, DIRECT: 0.14 mg/dL (ref 0.00–0.40)
Bilirubin Total: 0.5 mg/dL (ref 0.0–1.2)
Total Protein: 6.3 g/dL (ref 6.0–8.5)

## 2014-10-31 LAB — HEMOGLOBIN A1C
Est. average glucose Bld gHb Est-mCnc: 126 mg/dL
Hgb A1c MFr Bld: 6 % — ABNORMAL HIGH (ref 4.8–5.6)

## 2014-10-31 LAB — FERRITIN: Ferritin: 19 ng/mL — ABNORMAL LOW (ref 30–400)

## 2014-10-31 NOTE — Addendum Note (Signed)
Addended by: Jesusita Oka on: 10/31/2014 03:53 PM   Modules accepted: Orders

## 2014-11-01 ENCOUNTER — Encounter: Payer: Self-pay | Admitting: Gastroenterology

## 2014-11-05 ENCOUNTER — Encounter: Payer: Self-pay | Admitting: Family

## 2014-11-06 ENCOUNTER — Encounter (HOSPITAL_COMMUNITY): Payer: PPO

## 2014-11-06 ENCOUNTER — Ambulatory Visit: Payer: Commercial Managed Care - HMO | Admitting: Family

## 2014-11-07 ENCOUNTER — Encounter: Payer: Self-pay | Admitting: Internal Medicine

## 2014-11-08 ENCOUNTER — Ambulatory Visit (INDEPENDENT_AMBULATORY_CARE_PROVIDER_SITE_OTHER): Payer: PPO | Admitting: Gastroenterology

## 2014-11-08 ENCOUNTER — Telehealth: Payer: Self-pay | Admitting: *Deleted

## 2014-11-08 ENCOUNTER — Encounter: Payer: Self-pay | Admitting: Gastroenterology

## 2014-11-08 VITALS — BP 148/80 | HR 72 | Ht 68.0 in | Wt 189.8 lb

## 2014-11-08 DIAGNOSIS — D509 Iron deficiency anemia, unspecified: Secondary | ICD-10-CM | POA: Diagnosis not present

## 2014-11-08 DIAGNOSIS — Z1211 Encounter for screening for malignant neoplasm of colon: Secondary | ICD-10-CM | POA: Diagnosis not present

## 2014-11-08 DIAGNOSIS — Z7902 Long term (current) use of antithrombotics/antiplatelets: Secondary | ICD-10-CM | POA: Diagnosis not present

## 2014-11-08 MED ORDER — NA SULFATE-K SULFATE-MG SULF 17.5-3.13-1.6 GM/177ML PO SOLN: ORAL | Status: DC
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
## 2014-11-08 NOTE — Progress Notes (Signed)
Reviewed and agree.

## 2014-11-08 NOTE — Telephone Encounter (Signed)
11/08/2014  RE: [name] [name] DOB: [birth year] MRN: [id number]  Dear Dr Gwenlyn Found,   We have scheduled the above patient for an endoscopy and colonoscopy procedure. Our records show that he is on anticoagulation therapy.  Please advise as to whether the patient may come off his therapy of Plavix 5 days prior to the procedure, which is scheduled for 12/27/14. Please route your response to Dixon Boos, CMA.   Sincerely,  Dixon Boos

## 2014-11-08 NOTE — Patient Instructions (Signed)

## 2014-11-08 NOTE — Telephone Encounter (Signed)
Left message for patient to call back  

## 2014-11-08 NOTE — Telephone Encounter (Signed)
Patient advised that he may hold plavix 5 days prior to his procedure per Dr Gwenlyn Found. Patient verbalizes understanding.

## 2014-11-08 NOTE — Telephone Encounter (Signed)
OK to stop plavix for  EGD and re start afterwards

## 2014-12-05 ENCOUNTER — Encounter: Payer: Self-pay | Admitting: Family Medicine

## 2014-12-05 ENCOUNTER — Ambulatory Visit (INDEPENDENT_AMBULATORY_CARE_PROVIDER_SITE_OTHER): Payer: PPO | Admitting: Family Medicine

## 2014-12-05 ENCOUNTER — Other Ambulatory Visit: Payer: Self-pay | Admitting: *Deleted

## 2014-12-05 VITALS — BP 136/82 | Ht 68.0 in | Wt 191.6 lb

## 2014-12-05 DIAGNOSIS — R7303 Prediabetes: Secondary | ICD-10-CM

## 2014-12-05 DIAGNOSIS — Z23 Encounter for immunization: Secondary | ICD-10-CM

## 2014-12-05 DIAGNOSIS — I10 Essential (primary) hypertension: Secondary | ICD-10-CM

## 2014-12-05 DIAGNOSIS — E785 Hyperlipidemia, unspecified: Secondary | ICD-10-CM | POA: Diagnosis not present

## 2014-12-05 DIAGNOSIS — D509 Iron deficiency anemia, unspecified: Secondary | ICD-10-CM | POA: Diagnosis not present

## 2014-12-05 DIAGNOSIS — R7309 Other abnormal glucose: Secondary | ICD-10-CM

## 2014-12-05 MED ORDER — CLOPIDOGREL BISULFATE 75 MG PO TABS: 75.0000 mg | ORAL_TABLET | Freq: Every day | ORAL | Status: DC

## 2014-12-05 NOTE — Progress Notes (Signed)
   Subjective:    Patient ID: [name] [name], male    DOB: 04-14-1934, 80 y.o.   MRN: [id number]  Hypertension This is a chronic problem. The current episode started more than 1 year ago. Pertinent negatives include no chest pain. Risk factors for coronary artery disease include dyslipidemia and male gender. Treatments tried: norvasc ,metoprolol. There are no compliance problems.    Discuss recent lab work results  This patient has a history of peripheral vascular disease stroke and heart disease. Unfortunately the patient does not always eat as healthy as he should but he states he's been trying harder lately he does relate he is taking his medicines as directed. Uses tramadol to help with arthralgias. Occasionally takes Aleve. The patient was told he should not do that. He does take his heart medications. He also has prediabetes he watches his sugars in his diet he is unable to do a lot of exercise but does try to stay active Review of Systems  Constitutional: Negative for activity change, appetite change and fatigue.  HENT: Negative for congestion.   Respiratory: Negative for cough.   Cardiovascular: Negative for chest pain.  Gastrointestinal: Negative for abdominal pain.  Endocrine: Negative for polydipsia and polyphagia.  Neurological: Negative for weakness.  Psychiatric/Behavioral: Negative for confusion.       Objective:   Physical Exam  Constitutional: He appears well-nourished. No distress.  Cardiovascular: Normal rate, regular rhythm and normal heart sounds.   No murmur heard. Pulmonary/Chest: Effort normal and breath sounds normal. No respiratory distress.  Musculoskeletal: He exhibits no edema.  Lymphadenopathy:    He has no cervical adenopathy.  Neurological: He is alert.  Psychiatric: His behavior is normal.  Vitals reviewed.         Assessment & Plan:  1. Essential hypertension Blood pressure under decent control he states his blood pressures actually better  at home then it is here he is always had element of whitecoat hypertension  2. Anemia, iron deficiency Patient has history of iron deficient anemia he is overdue for colonoscopy he has put off doing the colonoscopy. He saw another GI doctor. That doctor recommended a colonoscopy but the patient is uncertain His family wants him to go see Dr. Collene Mares because other family members has seen him and they trust him. I told the patient it is important for him to be seen and have this taken care of plus also to discuss colonoscopy with the gastroenterologist. - Ambulatory referral to Gastroenterology  3. Prediabetes A1c decent control continue current measures  4. Hyperlipidemia Cholesterol profile looks good continue current medications.  5. Need for vaccination Given today patient refuses flu vaccine - Pneumococcal conjugate vaccine 13-valent IM

## 2014-12-18 ENCOUNTER — Ambulatory Visit (INDEPENDENT_AMBULATORY_CARE_PROVIDER_SITE_OTHER): Payer: PPO | Admitting: Cardiology

## 2014-12-18 ENCOUNTER — Encounter: Payer: Self-pay | Admitting: Cardiology

## 2014-12-18 VITALS — BP 156/90 | HR 57 | Ht 68.0 in | Wt 189.2 lb

## 2014-12-18 DIAGNOSIS — I10 Essential (primary) hypertension: Secondary | ICD-10-CM | POA: Diagnosis not present

## 2014-12-18 DIAGNOSIS — I2510 Atherosclerotic heart disease of native coronary artery without angina pectoris: Secondary | ICD-10-CM | POA: Diagnosis not present

## 2014-12-18 DIAGNOSIS — E785 Hyperlipidemia, unspecified: Secondary | ICD-10-CM | POA: Diagnosis not present

## 2014-12-18 DIAGNOSIS — I739 Peripheral vascular disease, unspecified: Secondary | ICD-10-CM | POA: Diagnosis not present

## 2014-12-18 DIAGNOSIS — Z9861 Coronary angioplasty status: Secondary | ICD-10-CM

## 2014-12-18 NOTE — Assessment & Plan Note (Addendum)
CAD s/p Anterior STEMI 07/02/13; s/p PCI + DES to proximal LAD. EF 50%.  No angina

## 2014-12-18 NOTE — Progress Notes (Signed)
12/18/2014 [name] [name]   [birth year]  [id number]  Primary Physician Sallee Lange, MD Primary Cardiologist: Dr Gwenlyn Found  HPI:   80 y/o with CAD and PVD as described, here for 6 month check up. He denies any chest pain or dyspnea. He is scheduled to have a colonoscopy and endoscopy in the near future. Dr Gwenlyn Found has cleared him for this and told him it was OK to stop Plavix 5 days before the procedure.    Current Outpatient Prescriptions  Medication Sig Dispense Refill  . acetaminophen (TYLENOL) 500 MG tablet Take 500 mg by mouth every 6 (six) hours as needed for mild pain or moderate pain.     [name] amLODipine (NORVASC) 10 MG tablet Take 1 tablet (10 mg total) by mouth daily. 90 tablet 0  . aspirin 81 MG tablet Take 81 mg by mouth daily. Taking 1/2 of  81 mg tablet    . clopidogrel (PLAVIX) 75 MG tablet Take 1 tablet (75 mg total) by mouth daily. 90 tablet 1  . IRON PO Take by mouth.    . methylcellulose (ARTIFICIAL TEARS) 1 % ophthalmic solution Place 1 drop into both eyes as needed (for dry eyes).    . metoprolol (LOPRESSOR) 50 MG tablet Take 1 tablet (50 mg total) by mouth 2 (two) times daily. 180 tablet 0  . Na Sulfate-K Sulfate-Mg Sulf SOLN SUPREP-use as directed 354 mL 0  . nitroGLYCERIN (NITROSTAT) 0.4 MG SL tablet Place 1 tablet (0.4 mg total) under the tongue every 5 (five) minutes as needed for chest pain. 25 tablet 2  . pravastatin (PRAVACHOL) 20 MG tablet Take 1 tablet (20 mg total) by mouth daily. 90 tablet 0  . traMADol (ULTRAM) 50 MG tablet Take 1 tablet (50 mg total) by mouth 3 (three) times daily. 90 tablet 2  . vitamin B-12 (CYANOCOBALAMIN) 1000 MCG tablet Take 1 tablet (1,000 mcg total) by mouth daily. 90 tablet 3   No current facility-administered medications for this visit.    Allergies  Allergen Reactions  . Brilinta [Ticagrelor] Shortness Of Breath    SOB  . Influenza Vaccines     Pt states he has been hospitalized both times he was given flu vaccine as a  younger adult while in the [employer] and they told him not to take it again  . Lipitor [Atorvastatin] Other (See Comments)    Muscle and joint pain    Social History   Social History  . Marital Status: Married    Spouse Name: N/A  . Number of Children: 3  . Years of Education: ASSOCIATES   Occupational History  . Retired    Social History Main Topics  . Smoking status: Former Smoker -- 0.50 packs/day for 7 years    Types: Cigarettes  . Smokeless tobacco: Never Used     Comment: 09/04/2013 "quit smoking cigarettes ~ age [age]"  . Alcohol Use: No  . Drug Use: No  . Sexual Activity: Not Currently   Other Topics Concern  . Not on file   Social History Narrative   Patient is married with 3 children.   Patient is right handed.   Patient has his Associates degree.   Patient drinks 2-3 cups daily.     Review of Systems: General: negative for chills, fever, night sweats or weight changes.  Cardiovascular: negative for chest pain, dyspnea on exertion, edema, orthopnea, palpitations, paroxysmal nocturnal dyspnea or shortness of breath Dermatological: negative for rash Respiratory: negative for cough or wheezing Urologic:  negative for hematuria Abdominal: negative for nausea, vomiting, diarrhea, bright red blood per rectum, melena, or hematemesis Neurologic: negative for visual changes, syncope, or dizziness All other systems reviewed and are otherwise negative except as noted above.    Blood pressure 156/90, pulse 57, height 5\' 8"  (1.727 m), weight 189 lb 3.2 oz (85.821 kg).  General appearance: alert, cooperative and no distress Neck: no carotid bruit and no JVD Lungs: clear to auscultation bilaterally Heart: regular rate and rhythm Skin: Skin color, texture, turgor normal. No rashes or lesions Neurologic: Grossly normal  EKG NSR, SB, inferior Q's, anterior Q's  ASSESSMENT AND PLAN:   CAD S/P percutaneous coronary angioplasty CAD s/p Anterior STEMI 07/02/13; s/p PCI + DES to  proximal LAD. EF 50%.  No angina  CVA (cerebral infarction) Two strokes, one 2-3 years ago and one in Dec 2015  PVD (peripheral vascular disease) Lt iliac PTA July 2015, RCIA PTA June 2015. Known occluded LICA, moderate RICA disease (medical Rx recommended after angiogram Feb 2016.  Hyperlipidemia On low dose Pravachol, LDL 40  Essential hypertension Controlled   PLAN  OK to proceed with GI work up for low Iron anemia. F/U Dr Gwenlyn Found in 6 months.   Kerin Ransom K PA-C 12/18/2014 10:53 AM

## 2014-12-18 NOTE — Assessment & Plan Note (Signed)
On low dose Pravachol, LDL 40

## 2014-12-18 NOTE — Assessment & Plan Note (Signed)
Two strokes, one 2-3 years ago and one in Dec 2015

## 2014-12-18 NOTE — Patient Instructions (Signed)
Continue same medications   Okay to hold Plavix 5 days before endoscopy/colonoscopy    Your physician wants you to follow-up with Dr.Bery in 6 months. You will receive a reminder letter in the mail two months in advance. If you don't receive a letter, please call our office to schedule the follow-up appointment.

## 2014-12-18 NOTE — Assessment & Plan Note (Signed)
Controlled.  

## 2014-12-18 NOTE — Assessment & Plan Note (Signed)
Lt iliac PTA July 2015, RCIA PTA June 2015. Known occluded LICA, moderate RICA disease (medical Rx recommended after angiogram Feb 2016.

## 2014-12-27 ENCOUNTER — Ambulatory Visit (AMBULATORY_SURGERY_CENTER): Payer: PPO | Admitting: Gastroenterology

## 2014-12-27 ENCOUNTER — Encounter: Payer: Self-pay | Admitting: Gastroenterology

## 2014-12-27 VITALS — BP 161/92 | HR 54 | Temp 96.6°F | Resp 16 | Ht 68.0 in | Wt 189.0 lb

## 2014-12-27 DIAGNOSIS — D122 Benign neoplasm of ascending colon: Secondary | ICD-10-CM | POA: Diagnosis not present

## 2014-12-27 DIAGNOSIS — D509 Iron deficiency anemia, unspecified: Secondary | ICD-10-CM | POA: Diagnosis not present

## 2014-12-27 DIAGNOSIS — K2990 Gastroduodenitis, unspecified, without bleeding: Secondary | ICD-10-CM | POA: Diagnosis not present

## 2014-12-27 DIAGNOSIS — D123 Benign neoplasm of transverse colon: Secondary | ICD-10-CM

## 2014-12-27 DIAGNOSIS — Z1211 Encounter for screening for malignant neoplasm of colon: Secondary | ICD-10-CM

## 2014-12-27 DIAGNOSIS — K2970 Gastritis, unspecified, without bleeding: Secondary | ICD-10-CM

## 2014-12-27 MED ORDER — FERROUS SULFATE 325 (65 FE) MG PO TABS: 325.0000 mg | ORAL_TABLET | Freq: Every day | ORAL | Status: DC

## 2014-12-27 MED ORDER — PANTOPRAZOLE SODIUM 20 MG PO TBEC
20.0000 mg | DELAYED_RELEASE_TABLET | Freq: Every day | ORAL | Status: DC
Start: 2014-12-27 — End: 2015-01-03

## 2014-12-27 MED ORDER — SODIUM CHLORIDE 0.9 % IV SOLN: 500.0000 mL | INTRAVENOUS | Status: DC

## 2014-12-27 NOTE — Patient Instructions (Signed)
RESTART YOUR PLAVIX TODAY.  YOU HAD AN ENDOSCOPIC PROCEDURE TODAY AT [facility]:   Refer to the procedure report that was given to you for any specific questions about what was found during the examination.  If the procedure report does not answer your questions, please call your gastroenterologist to clarify.  If you requested that your care partner not be given the details of your procedure findings, then the procedure report has been included in a sealed envelope for you to review at your convenience later.  YOU SHOULD EXPECT: Some feelings of bloating in the abdomen. Passage of more gas than usual.  Walking can help get rid of the air that was put into your GI tract during the procedure and reduce the bloating. If you had a lower endoscopy (such as a colonoscopy or flexible sigmoidoscopy) you may notice spotting of blood in your stool or on the toilet paper. If you underwent a bowel prep for your procedure, you may not have a normal bowel movement for a few days.  Please Note:  You might notice some irritation and congestion in your nose or some drainage.  This is from the oxygen used during your procedure.  There is no need for concern and it should clear up in a day or so.  SYMPTOMS TO REPORT IMMEDIATELY:   Following lower endoscopy (colonoscopy or flexible sigmoidoscopy):  Excessive amounts of blood in the stool  Significant tenderness or worsening of abdominal pains  Swelling of the abdomen that is new, acute  Fever of 100F or higher   Following upper endoscopy (EGD)  Vomiting of blood or coffee ground material  New chest pain or pain under the shoulder blades  Painful or persistently difficult swallowing  New shortness of breath  Fever of 100F or higher  Black, tarry-looking stools  For urgent or emergent issues, a gastroenterologist can be reached at any hour by calling [phone].   DIET: Your first meal following the procedure should be a small meal  and then it is ok to progress to your normal diet. Heavy or fried foods are harder to digest and may make you feel nauseous or bloated.  Likewise, meals heavy in dairy and vegetables can increase bloating.  Drink plenty of fluids but you should avoid alcoholic beverages for 24 hours.  ACTIVITY:  You should plan to take it easy for the rest of today and you should NOT DRIVE or use heavy machinery until tomorrow (because of the sedation medicines used during the test).    FOLLOW UP: Our staff will call the number listed on your records the next business day following your procedure to check on you and address any questions or concerns that you may have regarding the information given to you following your procedure. If we do not reach you, we will leave a message.  However, if you are feeling well and you are not experiencing any problems, there is no need to return our call.  We will assume that you have returned to your regular daily activities without incident.  If any biopsies were taken you will be contacted by phone or by letter within the next 1-3 weeks.  Please call us at [phone] if you have not heard about the biopsies in 3 weeks.    SIGNATURES/CONFIDENTIALITY: You and/or your care partner have signed paperwork which will be entered into your electronic medical record.  These signatures attest to the fact that that the information above on your After  Visit Summary has been reviewed and is understood.  Full responsibility of the confidentiality of this discharge information lies with you and/or your care-partner. 

## 2014-12-27 NOTE — Op Note (Signed)
[facility]  [address] [place], [address]   ENDOSCOPY PROCEDURE REPORT  PATIENT: [name] [name], [name] [name]  MR#: [id number] BIRTHDATE: [birth year] , 53  yrs. old GENDER: male ENDOSCOPIST: Yetta Flock, MD REFERRED BY: PROCEDURE DATE:  12/27/2014 PROCEDURE:  EGD w/ biopsy ASA CLASS:     Class III INDICATIONS:  iron deficiency anemia. MEDICATIONS: Propofol 100 mg IV TOPICAL ANESTHETIC:  DESCRIPTION OF PROCEDURE: After the risks benefits and alternatives of the procedure were thoroughly explained, informed consent was obtained.  The [id number] endoscope was introduced through the mouth and advanced to the second portion of the duodenum , Without limitations.  The instrument was slowly withdrawn as the mucosa was fully examined.    The esophagus was normal.  The DH was noted 40cm from the incisors with GEJ and SCJ located 34cm from the incisors, with a 6cm hiatal hernia.  On retroflexion in the stomach there was some erythema along some folds of the hernia sac consistent with mild Lysbeth Galas lesion.  There was erythematous gastritis noted in the gastric body otherwise without focal ulcerations.  Biopsies were obtained.  The remainder of the examined stomach was normal.  The duodenal bulb was normal.  There was a large diverticulum in the 2nd portion of the duodenum, normal remainder of examined duodenum.  Retroflexed views revealed as previously described.     The scope was then withdrawn from the patient and the procedure completed.  COMPLICATIONS: There were no immediate complications.  ENDOSCOPIC IMPRESSION: Large 6cm hiatal hernia with suspected mild Lysbeth Galas lesion Erythematous gastritis of the gastric body without ulceration, biopsies obtained Duodenal diverticulum Otherwise normal exam  RECOMMENDATIONS: Resume medications Resume diet Await pathology results Oral iron supplement daily if not already taking (Ferrous sulfate) I suspect  hiatal hernia with Lysbeth Galas lesion may be the source of anemia in the setting of plavix, but would recommend small bowel evaluation to ensure no other source given need for antiplatelet therapy. Will discuss capsule endoscopy vs. MR enteroscopy to be coordinated with patient.    eSigned:  Yetta Flock, MD 12/27/2014 4:00 PM    CC:  PATIENT NAME:  Issak, Goley MR#: [id number]

## 2014-12-27 NOTE — Progress Notes (Signed)
Called to room to assist during endoscopic procedure.  Patient ID and intended procedure confirmed with present staff. Received instructions for my participation in the procedure from the performing physician.  

## 2014-12-27 NOTE — Progress Notes (Signed)
Patient is a very poor historian when it comes to his medication.  First he told Pamala Hurry that he didn't take his bp meds, then he told me that he did.  His bp is high. No headaches noted.  Denies dizziness.

## 2014-12-27 NOTE — Progress Notes (Signed)
Report to PACU, RN, vss, BBS= Clear.  

## 2014-12-27 NOTE — Op Note (Signed)
[place]  [address] [place], [id number]   COLONOSCOPY PROCEDURE REPORT  PATIENT: [name] [name], [name] [name]  MR#: [id number] BIRTHDATE: 17-Mar-1935 , 67  yrs. old GENDER: male ENDOSCOPIST: Yetta Flock, MD REFERRED BY: PROCEDURE DATE:  12/27/2014 PROCEDURE:   Colonoscopy with biopsy First Screening Colonoscopy - Avg.  risk and is 50 yrs.  old or older - No.  Prior Negative Screening - Now for repeat screening. 10 or more years since last screening  History of Adenoma - Now for follow-up colonoscopy & has been > or = to 3 yrs.  N/A  Polyps removed today? Yes ASA CLASS:   Class III INDICATIONS:Unexplained iron deficiency anemia, Screening for colonic neoplasia, and Colorectal Neoplasm Risk Assessment for this procedure is average risk. MEDICATIONS: Propofol 200 mg IV  DESCRIPTION OF PROCEDURE:   After the risks benefits and alternatives of the procedure were thoroughly explained, informed consent was obtained.  The digital rectal exam revealed no abnormalities of the rectum.   The [id number]  endoscope was introduced through the anus and advanced to the terminal ileum which was intubated for a short distance. No adverse events experienced.   The quality of the prep was good.  The instrument was then slowly withdrawn as the colon was fully examined. Estimated blood loss is zero unless otherwise noted in this procedure report.   COLON FINDINGS: Three sessile polyps ranging from 3 to 63mm in size were found in the ascending colon.  Polypectomies were performed with cold forceps.  The resection was complete, the polyp tissue was completely retrieved and sent to histology.   A sessile polyp measuring 4 mm in size was found at the hepatic flexure.  A polypectomy was performed with cold forceps.  The resection was complete, the polyp tissue was completely retrieved and sent to histology.   There was severe diverticulosis noted in the sigmoid colon with  associated muscular hypertrophy.   The examination was otherwise normal.   The examined terminal ileum appeared to be normal.  Retroflexed views revealed internal hemorrhoids. The time to cecum = 1.7 Withdrawal time = 21.2   The scope was withdrawn and the procedure completed. COMPLICATIONS: There were no immediate complications.   ENDOSCOPIC IMPRESSION: 1.   Three sessile polyps ranging from 3 to 54mm in size were found in the ascending colon; polypectomies were performed with cold forceps 2.   Sessile polyp was found at the hepatic flexure; polypectomy was performed with cold forceps 3.   There was severe diverticulosis noted in the sigmoid colon 4.   The examined colon was otherwise normal 5.   The examined terminal ileum appeared to be normal  RECOMMENDATIONS: Resume medications (plavix) Resume diet Await pathology results. Further recommendations pending this result.  eSigned:  Yetta Flock, MD 12/27/2014 3:37 PM   cc:   PATIENT NAME:  [name] [name], [name] [name] MR#: [id number]

## 2014-12-28 ENCOUNTER — Telehealth: Payer: Self-pay | Admitting: *Deleted

## 2014-12-28 NOTE — Telephone Encounter (Signed)
  Follow up Call-  Call back number 12/27/2014  Post procedure Call Back phone  # [phone]  Permission to leave phone message Yes     Patient questions:  Do you have a fever, pain , or abdominal swelling? No. Pain Score  0 *  Have you tolerated food without any problems? Yes.    Have you been able to return to your normal activities? Yes.    Do you have any questions about your discharge instructions: Diet   No. Medications  No. Follow up visit  No.  Do you have questions or concerns about your Care? No.  Actions: * If pain score is 4 or above: No action needed, pain <4.

## 2015-01-02 ENCOUNTER — Other Ambulatory Visit: Payer: Self-pay | Admitting: *Deleted

## 2015-01-02 MED ORDER — METOPROLOL TARTRATE 50 MG PO TABS: 50.0000 mg | ORAL_TABLET | Freq: Two times a day (BID) | ORAL | Status: DC

## 2015-01-02 MED ORDER — AMLODIPINE BESYLATE 10 MG PO TABS: 10.0000 mg | ORAL_TABLET | Freq: Every day | ORAL | Status: DC

## 2015-01-03 ENCOUNTER — Other Ambulatory Visit: Payer: Self-pay | Admitting: *Deleted

## 2015-01-03 DIAGNOSIS — D509 Iron deficiency anemia, unspecified: Secondary | ICD-10-CM

## 2015-01-03 DIAGNOSIS — B9681 Helicobacter pylori [H. pylori] as the cause of diseases classified elsewhere: Secondary | ICD-10-CM

## 2015-01-03 DIAGNOSIS — K2970 Gastritis, unspecified, without bleeding: Secondary | ICD-10-CM

## 2015-01-03 MED ORDER — CLARITHROMYCIN 500 MG PO TABS: ORAL_TABLET | ORAL | Status: DC

## 2015-01-03 MED ORDER — PANTOPRAZOLE SODIUM 20 MG PO TBEC: DELAYED_RELEASE_TABLET | ORAL | Status: DC

## 2015-01-03 MED ORDER — METRONIDAZOLE 500 MG PO TABS: ORAL_TABLET | ORAL | Status: DC

## 2015-01-03 MED ORDER — AMOXICILLIN 500 MG PO TABS: ORAL_TABLET | ORAL | Status: DC

## 2015-01-07 ENCOUNTER — Other Ambulatory Visit (INDEPENDENT_AMBULATORY_CARE_PROVIDER_SITE_OTHER): Payer: PPO

## 2015-01-07 DIAGNOSIS — D509 Iron deficiency anemia, unspecified: Secondary | ICD-10-CM | POA: Diagnosis not present

## 2015-01-07 DIAGNOSIS — K2970 Gastritis, unspecified, without bleeding: Secondary | ICD-10-CM

## 2015-01-07 DIAGNOSIS — B9681 Helicobacter pylori [H. pylori] as the cause of diseases classified elsewhere: Secondary | ICD-10-CM

## 2015-01-07 LAB — BUN: BUN: 16 mg/dL (ref 6–23)

## 2015-01-07 LAB — CREATININE, SERUM: CREATININE: 0.99 mg/dL (ref 0.40–1.50)

## 2015-01-09 ENCOUNTER — Other Ambulatory Visit: Payer: Self-pay | Admitting: *Deleted

## 2015-01-09 DIAGNOSIS — A048 Other specified bacterial intestinal infections: Secondary | ICD-10-CM

## 2015-01-09 LAB — H. PYLORI ANTIGEN, STOOL: H PYLORI AG STL: NEGATIVE

## 2015-01-10 ENCOUNTER — Encounter (HOSPITAL_COMMUNITY): Payer: Self-pay

## 2015-01-10 ENCOUNTER — Ambulatory Visit (HOSPITAL_COMMUNITY)
Admission: RE | Admit: 2015-01-10 | Discharge: 2015-01-10 | Disposition: A | Discharge status: Home / Self Care | Payer: PPO | Source: Ambulatory Visit | Attending: Gastroenterology | Admitting: Gastroenterology

## 2015-01-10 DIAGNOSIS — K5730 Diverticulosis of large intestine without perforation or abscess without bleeding: Secondary | ICD-10-CM | POA: Insufficient documentation

## 2015-01-10 DIAGNOSIS — D509 Iron deficiency anemia, unspecified: Secondary | ICD-10-CM | POA: Diagnosis present

## 2015-01-10 MED ORDER — IOHEXOL 300 MG/ML  SOLN
100.0000 mL | Freq: Once | INTRAMUSCULAR | Status: AC | PRN
  Administered 2015-01-10: 100 mL via INTRAVENOUS

## 2015-01-11 ENCOUNTER — Other Ambulatory Visit: Payer: Self-pay | Admitting: *Deleted

## 2015-01-11 DIAGNOSIS — D508 Other iron deficiency anemias: Secondary | ICD-10-CM

## 2015-01-18 ENCOUNTER — Ambulatory Visit (HOSPITAL_COMMUNITY): Payer: PPO

## 2015-01-29 ENCOUNTER — Telehealth: Payer: Self-pay | Admitting: *Deleted

## 2015-01-29 NOTE — Telephone Encounter (Signed)
Left a message for patient to call back. 

## 2015-01-29 NOTE — Telephone Encounter (Signed)
Patient states he has completed the antibiotics. He will stop Protonix tomorrow and come for testing in 2 weeks.

## 2015-01-29 NOTE — Telephone Encounter (Signed)
-----   Message from Hulan Saas, RN sent at 01/03/2015 10:57 AM EDT ----- Call to see if patient completed ABX. Needs to stop Protonix x 2 weeks prior to h. Pylori testing. SA

## 2015-02-07 ENCOUNTER — Telehealth: Payer: Self-pay | Admitting: *Deleted

## 2015-02-07 NOTE — Telephone Encounter (Signed)
-----   Message from Hulan Saas, RN sent at 01/11/2015  9:32 AM EDT ----- Call and remind due for CBC on 02/11/15 for SA

## 2015-02-07 NOTE — Telephone Encounter (Signed)
Patient notified and he will come for lab.

## 2015-02-12 ENCOUNTER — Other Ambulatory Visit: Payer: PPO

## 2015-02-12 ENCOUNTER — Other Ambulatory Visit (INDEPENDENT_AMBULATORY_CARE_PROVIDER_SITE_OTHER): Payer: PPO

## 2015-02-12 DIAGNOSIS — D508 Other iron deficiency anemias: Secondary | ICD-10-CM

## 2015-02-12 DIAGNOSIS — A048 Other specified bacterial intestinal infections: Secondary | ICD-10-CM

## 2015-02-12 LAB — CBC WITH DIFFERENTIAL/PLATELET
BASOS PCT: 0.3 % (ref 0.0–3.0)
Basophils Absolute: 0 10*3/uL (ref 0.0–0.1)
EOS ABS: 0.3 10*3/uL (ref 0.0–0.7)
Eosinophils Relative: 4.4 % (ref 0.0–5.0)
HEMATOCRIT: 46.1 % (ref 39.0–52.0)
Hemoglobin: 14.9 g/dL (ref 13.0–17.0)
LYMPHS ABS: 1.3 10*3/uL (ref 0.7–4.0)
LYMPHS PCT: 17.3 % (ref 12.0–46.0)
MCHC: 32.3 g/dL (ref 30.0–36.0)
MCV: 81 fl (ref 78.0–100.0)
Monocytes Absolute: 0.6 10*3/uL (ref 0.1–1.0)
Monocytes Relative: 7.7 % (ref 3.0–12.0)
NEUTROS ABS: 5.3 10*3/uL (ref 1.4–7.7)
NEUTROS PCT: 70.3 % (ref 43.0–77.0)
PLATELETS: 137 10*3/uL — AB (ref 150.0–400.0)
RBC: 5.68 Mil/uL (ref 4.22–5.81)
RDW: 19.8 % — AB (ref 11.5–15.5)
WBC: 7.5 10*3/uL (ref 4.0–10.5)

## 2015-02-14 LAB — H. PYLORI ANTIGEN, STOOL: H PYLORI AG STL: NEGATIVE

## 2015-03-15 ENCOUNTER — Telehealth: Payer: Self-pay | Admitting: Family Medicine

## 2015-03-15 MED ORDER — TRAMADOL HCL 50 MG PO TABS: 50.0000 mg | ORAL_TABLET | Freq: Three times a day (TID) | ORAL | Status: DC

## 2015-03-15 MED ORDER — CLOPIDOGREL BISULFATE 75 MG PO TABS: 75.0000 mg | ORAL_TABLET | Freq: Every day | ORAL | Status: DC

## 2015-03-15 NOTE — Telephone Encounter (Signed)
clopidogrel (PLAVIX) 75 MG tablet Tramadol   Pt needs a refill on these meds [facility] [place] for  Just a 30 day supply his mail order has been messed up an he  Did not receive his supply this month for these meds   He also states he needs a refill on the tramadol sent to his mail  Order for 90 day supplies please

## 2015-03-15 NOTE — Telephone Encounter (Signed)
Called patient and informed him per Dr.Steve Luking- requested medications refilled and sent to requested pharmacy. Patient verbalized understanding.

## 2015-03-15 NOTE — Telephone Encounter (Signed)
Ok three mo for both at pharm of pts choosing

## 2015-04-05 ENCOUNTER — Telehealth: Payer: Self-pay | Admitting: Family Medicine

## 2015-04-05 MED ORDER — METOPROLOL TARTRATE 50 MG PO TABS: 50.0000 mg | ORAL_TABLET | Freq: Two times a day (BID) | ORAL | Status: DC

## 2015-04-05 MED ORDER — AMLODIPINE BESYLATE 10 MG PO TABS: 10.0000 mg | ORAL_TABLET | Freq: Every day | ORAL | Status: DC

## 2015-04-05 MED ORDER — PRAVASTATIN SODIUM 20 MG PO TABS: 20.0000 mg | ORAL_TABLET | Freq: Every day | ORAL | Status: DC

## 2015-04-05 MED ORDER — CLOPIDOGREL BISULFATE 75 MG PO TABS: 75.0000 mg | ORAL_TABLET | Freq: Every day | ORAL | Status: DC

## 2015-04-05 NOTE — Telephone Encounter (Signed)
Notified patient meds sent to pharmacy.  

## 2015-04-05 NOTE — Telephone Encounter (Signed)
Pt is needing all of his current medications sent in to his mail order pharmacy.      Pharmacy:  [facility], [place]

## 2015-06-04 ENCOUNTER — Ambulatory Visit: Payer: PPO | Admitting: Family Medicine

## 2015-06-04 ENCOUNTER — Telehealth: Payer: Self-pay | Admitting: Family Medicine

## 2015-06-04 DIAGNOSIS — R7301 Impaired fasting glucose: Secondary | ICD-10-CM

## 2015-06-04 DIAGNOSIS — I10 Essential (primary) hypertension: Secondary | ICD-10-CM

## 2015-06-04 DIAGNOSIS — E785 Hyperlipidemia, unspecified: Secondary | ICD-10-CM

## 2015-06-04 NOTE — Telephone Encounter (Signed)
bw orders for appt  Last chronic labs were 10/30/14 A1C, Hep, Lip, BMP, ferritin, CBC   Other labs ran since

## 2015-06-04 NOTE — Telephone Encounter (Signed)
Met 7 lipid liv A1qC

## 2015-06-04 NOTE — Telephone Encounter (Signed)
Spoke with patient and informed him per [place] ordered. Needs to be fasting at least 8 hrs prior to labs being drawn. Patient verbalized understanding.

## 2015-06-06 DIAGNOSIS — R7301 Impaired fasting glucose: Secondary | ICD-10-CM | POA: Diagnosis not present

## 2015-06-06 DIAGNOSIS — I10 Essential (primary) hypertension: Secondary | ICD-10-CM | POA: Diagnosis not present

## 2015-06-06 DIAGNOSIS — E785 Hyperlipidemia, unspecified: Secondary | ICD-10-CM | POA: Diagnosis not present

## 2015-06-07 LAB — BASIC METABOLIC PANEL
BUN / CREAT RATIO: 13 (ref 10–22)
BUN: 12 mg/dL (ref 8–27)
CHLORIDE: 101 mmol/L (ref 96–106)
CO2: 25 mmol/L (ref 18–29)
CREATININE: 0.95 mg/dL (ref 0.76–1.27)
Calcium: 9 mg/dL (ref 8.6–10.2)
GFR calc Af Amer: 88 mL/min/{1.73_m2} (ref >59)
GFR calc non Af Amer: 76 mL/min/{1.73_m2} (ref >59)
GLUCOSE: 119 mg/dL — AB (ref 65–99)
POTASSIUM: 4.5 mmol/L (ref 3.5–5.2)
Sodium: 143 mmol/L (ref 134–144)

## 2015-06-07 LAB — LIPID PANEL
CHOLESTEROL TOTAL: 105 mg/dL (ref 100–199)
Chol/HDL Ratio: 3.6 ratio units (ref 0.0–5.0)
HDL: 29 mg/dL — ABNORMAL LOW (ref >39)
LDL CALC: 47 mg/dL (ref 0–99)
Triglycerides: 144 mg/dL (ref 0–149)
VLDL Cholesterol Cal: 29 mg/dL (ref 5–40)

## 2015-06-07 LAB — HEPATIC FUNCTION PANEL
ALBUMIN: 4.4 g/dL (ref 3.5–4.8)
ALT: 13 IU/L (ref 0–44)
AST: 19 IU/L (ref 0–40)
Alkaline Phosphatase: 60 IU/L (ref 39–117)
BILIRUBIN TOTAL: 0.6 mg/dL (ref 0.0–1.2)
Bilirubin, Direct: 0.18 mg/dL (ref 0.00–0.40)
Total Protein: 6.2 g/dL (ref 6.0–8.5)

## 2015-06-07 LAB — HEMOGLOBIN A1C
Est. average glucose Bld gHb Est-mCnc: 111 mg/dL
Hgb A1c MFr Bld: 5.5 % (ref 4.8–5.6)

## 2015-06-12 ENCOUNTER — Encounter: Payer: Self-pay | Admitting: Family Medicine

## 2015-06-12 ENCOUNTER — Ambulatory Visit (INDEPENDENT_AMBULATORY_CARE_PROVIDER_SITE_OTHER): Payer: PPO | Admitting: Family Medicine

## 2015-06-12 VITALS — BP 152/86 | Ht 68.0 in | Wt 195.5 lb

## 2015-06-12 DIAGNOSIS — G5600 Carpal tunnel syndrome, unspecified upper limb: Secondary | ICD-10-CM | POA: Insufficient documentation

## 2015-06-12 DIAGNOSIS — M1990 Unspecified osteoarthritis, unspecified site: Secondary | ICD-10-CM

## 2015-06-12 DIAGNOSIS — G5603 Carpal tunnel syndrome, bilateral upper limbs: Secondary | ICD-10-CM

## 2015-06-12 NOTE — Progress Notes (Signed)
   Subjective:    Patient ID: [name] [name], male    DOB: 1934-05-14, 81 y.o.   MRN: [id number]  Hypertension This is a chronic problem. The current episode started more than 1 year ago. Pertinent negatives include no chest pain. There are no compliance problems.   patient denies any chest tightness pressure pain shortness of breath. States overall he is been doing well. Patient had recent labs drawn on 06/06/15. Patient relates knee pain discomfort intermittently unable to take any strong anti-inflammatories because of his other conditions patient also relates intermittent numbness into the hands intermittent achiness in the arms uses tramadol seems to help denies any other particular troubles Patient has c/o aching pain to hands, and shoulders.   Review of Systems  Constitutional: Negative for activity change, appetite change and fatigue.  HENT: Negative for congestion.   Respiratory: Negative for cough.   Cardiovascular: Negative for chest pain.  Gastrointestinal: Negative for abdominal pain.  Endocrine: Negative for polydipsia and polyphagia.  Neurological: Negative for weakness.  Psychiatric/Behavioral: Negative for confusion.       Objective:   Physical Exam  Constitutional: He appears well-nourished. No distress.  Cardiovascular: Normal rate, regular rhythm and normal heart sounds.   No murmur heard. Pulmonary/Chest: Effort normal and breath sounds normal. No respiratory distress.  Musculoskeletal: He exhibits no edema.  Lymphadenopathy:    He has no cervical adenopathy.  Neurological: He is alert.  Psychiatric: His behavior is normal.  Vitals reviewed.  Pulses in the hands are good function of the hands good Patient with osteoarthritis of the left knee is requesting to see Dr. Lorre Nick      Assessment & Plan:  Blood pressure slightly elevated on recheck I recommend the patient recheck it again with cardiology when he follows up in a couple months with  cardiology.  Cholesterol looks great continue current measures  Patient recovering from stroke continue Plavix patient no longer taking 81 mg aspirin  History of GERD taking protonic's doing well with this  Elevated fasting glucose recommend low starch diet  Follow-up 6 months  Patient with intermittent numbness into the hands. Worse when he is try to drive occasionally wakes him up at nightprobable carpal tunnel nerve conduction recommended

## 2015-06-14 ENCOUNTER — Encounter: Payer: Self-pay | Admitting: Family Medicine

## 2015-06-25 DIAGNOSIS — M1712 Unilateral primary osteoarthritis, left knee: Secondary | ICD-10-CM | POA: Diagnosis not present

## 2015-06-25 DIAGNOSIS — M25462 Effusion, left knee: Secondary | ICD-10-CM | POA: Diagnosis not present

## 2015-06-25 DIAGNOSIS — M25562 Pain in left knee: Secondary | ICD-10-CM | POA: Diagnosis not present

## 2015-06-27 ENCOUNTER — Telehealth: Payer: Self-pay | Admitting: Family Medicine

## 2015-06-27 ENCOUNTER — Telehealth: Payer: Self-pay

## 2015-06-27 NOTE — Telephone Encounter (Signed)
Being that this patient is a cardiology patient ID definitely do not feel comfortable giving preoperative clearance. This patient should be seen by cardiology in order to get preoperative clearance. Also the patient needs to let his cardiologist no what type of procedure he is having because certainly this ways in in regards to the decision. Therefore I recommend that the patient get clearance from his cardiologist. Please notify the patient. A copy of this message will be forwarded to his cardiologist.

## 2015-06-27 NOTE — Telephone Encounter (Signed)
For in your yellow folder

## 2015-06-27 NOTE — Telephone Encounter (Signed)
Requesting surgical clearance:   1. Type of surgery: Orthopaedic Procedure  2. Surgeon: Dr. Vickey Huger  3. Surgical date: pending clearance  4. Medications that need to be held: Plavix  5. CAD: Yes     6. I will defer to: [facility]   [facility] [place] Attn: Dr. Ronnie Derby and [place], [place] [phone] fax [phone] phone

## 2015-06-27 NOTE — Telephone Encounter (Signed)
Pt dropped off a form for pre op clearance, was just seen 3/15 and felt like he did  Not need an appt so I told him I would ask you but that the protocol was to make an appt  He is on the schedule for 4/7 @ 3 currently   Please advise

## 2015-06-28 NOTE — Telephone Encounter (Signed)
Form was faxed back to [facility] [place] stating Not cleared for surgery to send form to pt's cardiologist for clearance. [facility] to notify pt.

## 2015-06-28 NOTE — Telephone Encounter (Signed)
LMRC

## 2015-06-28 NOTE — Telephone Encounter (Signed)
Pt.notified

## 2015-06-30 NOTE — Telephone Encounter (Signed)
Can interrupt his Plavix for orthopedic surgery. Will prob need a lexiscan for clearance given LAD stent in setting of Ant STEMI 4/15 and tot RCA.  JJB

## 2015-07-03 NOTE — Telephone Encounter (Signed)
Call pt, no VM setup, unable to leave message.

## 2015-07-04 ENCOUNTER — Ambulatory Visit (INDEPENDENT_AMBULATORY_CARE_PROVIDER_SITE_OTHER): Payer: PPO | Admitting: Cardiovascular Disease

## 2015-07-04 ENCOUNTER — Encounter: Payer: Self-pay | Admitting: Cardiovascular Disease

## 2015-07-04 VITALS — BP 208/88 | HR 62 | Ht 68.0 in | Wt 192.2 lb

## 2015-07-04 DIAGNOSIS — I10 Essential (primary) hypertension: Secondary | ICD-10-CM | POA: Diagnosis not present

## 2015-07-04 DIAGNOSIS — Z01818 Encounter for other preprocedural examination: Secondary | ICD-10-CM

## 2015-07-04 DIAGNOSIS — I6529 Occlusion and stenosis of unspecified carotid artery: Secondary | ICD-10-CM

## 2015-07-04 DIAGNOSIS — I739 Peripheral vascular disease, unspecified: Secondary | ICD-10-CM | POA: Diagnosis not present

## 2015-07-04 DIAGNOSIS — I2109 ST elevation (STEMI) myocardial infarction involving other coronary artery of anterior wall: Secondary | ICD-10-CM

## 2015-07-04 DIAGNOSIS — E785 Hyperlipidemia, unspecified: Secondary | ICD-10-CM | POA: Diagnosis not present

## 2015-07-04 NOTE — Assessment & Plan Note (Signed)
History of hyperlipidemia on statin therapy with recent lipid profile performed 06/06/15 and LDL 47 and HDL 29

## 2015-07-04 NOTE — Assessment & Plan Note (Addendum)
History of CAD status post acute anterior ST segment elevation myocardial infarction 07/02/13. Cath revealed an occluded dominant right with left right collaterals and high-grade proximal LAD stenosis with thrombus. He underwent aspiration thrombectomy and stenting of his proximal LAD with a 2.5 x 33 mm long Xence Alpine drug-eluting stent. EF was 50% at his door to balloon time was 28 minutes. He remains on duel antiplatelet therapy. He denies chest pain or shortness of breath. Dr. Lorre Nick was to perform a left total knee replacement. Going to get a formal cardiac Myoview stress test to risk stratify him. If this is low risk he can undergo his orthopedic procedure at low risk and can stop his antiplatelet therapy for a week prior to the procedure

## 2015-07-04 NOTE — Assessment & Plan Note (Signed)
History of carotid artery disease with a known occluded left internal carotid artery and moderate right ICA stenosis demonstrated by angiography. Would continue to follow this by duplex ultrasound.

## 2015-07-04 NOTE — Patient Instructions (Addendum)
Medication Instructions:  Your physician recommends that you continue on your current medications as directed. Please refer to the Current Medication list given to you today.   Labwork: none  Testing/Procedures: Your physician has requested that you have a lower extremity arterial doppler- During this test, ultrasound is used to evaluate arterial blood flow in the legs. Allow approximately one hour for this exam.   Your physician has requested that you have a carotid duplex. This test is an ultrasound of the carotid arteries in your neck. It looks at blood flow through these arteries that supply the brain with blood. Allow one hour for this exam. There are no restrictions or special instructions.  Your physician has requested that you have a lexiscan myoview. For further information please visit [email or web address]. Please follow instruction sheet, as given.     Follow-Up: Your physician wants you to follow-up in: 12 months with [name]. You will receive a reminder letter in the mail two months in advance. If you don't receive a letter, please call our office to schedule the follow-up appointment.   Any Other Special Instructions Will Be Listed Below (If Applicable).     If you need a refill on your cardiac medications before your next appointment, please call your pharmacy.

## 2015-07-04 NOTE — Progress Notes (Signed)
07/04/2015 [name] [name]   [birth year]  [id number]  Primary Physician Sallee Lange, MD Primary Cardiologist: Lorretta Harp MD [place], [name]   HPI:  [name] [name] is a 80y/ .o. 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 I last saw him 06/19/14.[name] He was admitted on 07/02/13 for Anterior STEMI. Presenting symptoms included severe epigastric burning pain/ indigestion and left arm pain. Initial troponin was > 20.0. He underwent emergent LHC and successful aspiration thrombectomy, PCI and stenting of proximal LAD by [name], via the right femoral artery . The overall LVEF was estimated at 50%, without wall motion abnormalities. He tolerated the procedure well and left the cath lab in stable condition. DAPT with ASA and Brilinta was initiated. He was also placed on a BB, ACE-I and statin. His post PCI course was w/o complication. He was discharged home on 07/05/13.  It should also be noted that at time of emergent LHC, he was found to also have significant peripheral vascular disease with an occluded left iliac and high-grade right common iliac artery stenosis, as well as a small AAA. The patient complained of bilateral LEE. [name] ordered for him to undergo bilateral LEAs. The study was performed in our office on 07/18/13. Findings are as follows: ABI 0.95 on Rt, 0.59 on Lt.the patient underwent angiography, diamondback orbital rotational arthrectomy, PTA and stenting of a highly calcified ostial right common iliac artery stenosis. He had excellent angiographic and clinical result. His Dopplers normalized to no longer has right lower extremity claudication. He wishes to proceed with attempt at percutaneous opacification of his left common iliac chronic total occlusion. He is not tolerating his Brilenta because of shortness of breath and we subsequently transitioned to Plavix. He recently had a left brain stroke thought  to be related to a watershed infarct from a hemodynamically significant right carotid stenosis in the setting of an occluded left carotid. He was evaluated by Dr. Erlinda Hong from [facility]. Dr. Erlinda Hong thought he would be a candidate for carotid stenting should he be found have a right internal carotid artery stenosis of greater than 70%.I intubated him on 05/15/14 revealing at most a 40-50% smooth right internal artery stenosis. I suspect his carotid ultrasound overestimated the degree of stenosis because the contralateral occlusion.  Since I saw him a year ago he's remained stable. He denies chest pain, shortness of breath or claudication. He does want to have a left total knee replacement. Will need to obtain a formal classic [place] Key stress test to risk stratify him. I'm also going to repeat his carotid and lower extremity arterial Doppler studies.   Current Outpatient Prescriptions  Medication Sig Dispense Refill  . acetaminophen (TYLENOL) 500 MG tablet Take 500 mg by mouth every 6 (six) hours as needed for mild pain or moderate pain.     [name] amLODipine (NORVASC) 10 MG tablet Take 1 tablet (10 mg total) by mouth daily. 90 tablet 0  . clopidogrel (PLAVIX) 75 MG tablet Take 1 tablet (75 mg total) by mouth daily. 90 tablet 0  . ferrous sulfate 325 (65 FE) MG tablet Take 1 tablet (325 mg total) by mouth daily with breakfast. 90 tablet 3  . IRON PO Take by mouth. Reported on 06/12/2015    . methylcellulose (ARTIFICIAL TEARS) 1 % ophthalmic solution Place 1 drop into both eyes as needed (for dry eyes).    . metoprolol (LOPRESSOR) 50 MG  tablet Take 1 tablet (50 mg total) by mouth 2 (two) times daily. 180 tablet 0  . nitroGLYCERIN (NITROSTAT) 0.4 MG SL tablet Place 1 tablet (0.4 mg total) under the tongue every 5 (five) minutes as needed for chest pain. 25 tablet 2  . pantoprazole (PROTONIX) 20 MG tablet Take one po BID x 14 days then daily 90 tablet 1  . pravastatin (PRAVACHOL) 20 MG tablet Take 1  tablet (20 mg total) by mouth daily. 90 tablet 0  . traMADol (ULTRAM) 50 MG tablet Take 1 tablet (50 mg total) by mouth 3 (three) times daily. 90 tablet 2  . vitamin B-12 (CYANOCOBALAMIN) 1000 MCG tablet Take 1 tablet (1,000 mcg total) by mouth daily. 90 tablet 3   No current facility-administered medications for this visit.    Allergies  Allergen Reactions  . Brilinta [Ticagrelor] Shortness Of Breath    SOB  . Influenza Vaccines     Pt states he has been hospitalized both times he was given flu vaccine as a younger adult while in the [employer] and they told him not to take it again  . Lipitor [Atorvastatin] Other (See Comments)    Muscle and joint pain    Social History   Social History  . Marital Status: Married    Spouse Name: N/A  . Number of Children: 3  . Years of Education: ASSOCIATES   Occupational History  . Retired    Social History Main Topics  . Smoking status: Former Smoker -- 0.50 packs/day for 7 years    Types: Cigarettes  . Smokeless tobacco: Never Used     Comment: 09/04/2013 "quit smoking cigarettes ~ age [age]"  . Alcohol Use: No  . Drug Use: No  . Sexual Activity: Not Currently   Other Topics Concern  . Not on file   Social History Narrative   Patient is married with 3 children.   Patient is right handed.   Patient has his Associates degree.   Patient drinks 2-3 cups daily.     Review of Systems: General: negative for chills, fever, night sweats or weight changes.  Cardiovascular: negative for chest pain, dyspnea on exertion, edema, orthopnea, palpitations, paroxysmal nocturnal dyspnea or shortness of breath Dermatological: negative for rash Respiratory: negative for cough or wheezing Urologic: negative for hematuria Abdominal: negative for nausea, vomiting, diarrhea, bright red blood per rectum, melena, or hematemesis Neurologic: negative for visual changes, syncope, or dizziness All other systems reviewed and are otherwise negative except as noted  above.    Blood pressure 208/88, pulse 62, height 5\' 8"  (1.727 m), weight 192 lb 3.2 oz (87.181 kg).  General appearance: alert and no distress Neck: no adenopathy, no carotid bruit, no JVD, supple, symmetrical, trachea midline and thyroid not enlarged, symmetric, no tenderness/mass/nodules Lungs: clear to auscultation bilaterally Heart: regular rate and rhythm, S1, S2 normal, no murmur, click, rub or gallop Extremities: extremities normal, atraumatic, no cyanosis or edema  EKG not performed today  ASSESSMENT AND PLAN:   Hyperlipidemia History of hyperlipidemia on statin therapy with recent lipid profile performed 06/06/15 and LDL 47 and HDL 29  Essential hypertension, benign History of hypertension with blood pressure measured at 208/88. According to his wife this is higher than it usually runs at home. He is on amlodipine and metoprolol. Continue current meds at current dosing  Occlusion and stenosis of carotid artery with cerebral infarction History of carotid artery disease with a known occluded left internal carotid artery and moderate right ICA stenosis demonstrated  by angiography. Would continue to follow this by duplex ultrasound.  STEMI (ST elevation myocardial infarction) History of CAD status post acute anterior ST segment elevation myocardial infarction 07/02/13. Cath revealed an occluded dominant right with left right collaterals and high-grade proximal LAD stenosis with thrombus. He underwent aspiration thrombectomy and stenting of his proximal LAD with a 2.5 x 33 mm long Xence Alpine drug-eluting stent. EF was 50% at his door to balloon time was 28 minutes. He remains on duel antiplatelet therapy. He denies chest pain or shortness of breath. Dr. Lorre Nick was to perform a left total knee replacement. Going to get a formal cardiac Myoview stress test to risk stratify him. If this is low risk he can undergo his orthopedic procedure at low risk and can stop his antiplatelet therapy for a  week prior to the procedure  Peripheral vascular disease ([place]) History of PAD status post staged iliac intervention. I performed a diamondback orbital rotational atherectomy, PTCA and stenting of his right common iliac 09/04/13 with staged left common iliac intervention 10/12/13 of a chronic total occlusion. His claudication has resolved and his last lower extremity arterial Doppler studies performed 06/29/14 revealed normal ABIs bilaterally with patent stents. He denies claudication.      Lorretta Harp MD FACP,FACC,FAHA, [facility] 07/04/2015 3:54 PM

## 2015-07-04 NOTE — Assessment & Plan Note (Signed)
History of PAD status post staged iliac intervention. I performed a diamondback orbital rotational atherectomy, PTCA and stenting of his right common iliac 09/04/13 with staged left common iliac intervention 10/12/13 of a chronic total occlusion. His claudication has resolved and his last lower extremity arterial Doppler studies performed 06/29/14 revealed normal ABIs bilaterally with patent stents. He denies claudication.

## 2015-07-04 NOTE — Assessment & Plan Note (Signed)
History of hypertension with blood pressure measured at 208/88. According to his wife this is higher than it usually runs at home. He is on amlodipine and metoprolol. Continue current meds at current dosing

## 2015-07-08 NOTE — Telephone Encounter (Signed)
Pt seen on 4/6 by [name].

## 2015-07-11 ENCOUNTER — Ambulatory Visit (INDEPENDENT_AMBULATORY_CARE_PROVIDER_SITE_OTHER): Payer: PPO | Admitting: Diagnostic Neuroimaging

## 2015-07-11 ENCOUNTER — Encounter (INDEPENDENT_AMBULATORY_CARE_PROVIDER_SITE_OTHER): Payer: Self-pay | Admitting: Diagnostic Neuroimaging

## 2015-07-11 DIAGNOSIS — R200 Anesthesia of skin: Secondary | ICD-10-CM

## 2015-07-11 DIAGNOSIS — R208 Other disturbances of skin sensation: Secondary | ICD-10-CM

## 2015-07-11 DIAGNOSIS — Z0289 Encounter for other administrative examinations: Secondary | ICD-10-CM

## 2015-07-12 ENCOUNTER — Telehealth (HOSPITAL_COMMUNITY): Payer: Self-pay

## 2015-07-12 NOTE — Telephone Encounter (Signed)
Encounter complete. 

## 2015-07-12 NOTE — Procedures (Signed)
   [facility]  NCS (NERVE CONDUCTION STUDY) WITH EMG (ELECTROMYOGRAPHY) REPORT   STUDY DATE: 07/11/15 PATIENT NAME: [name] [name] DOB: 08/27/1934 MRN: [id number]  ORDERING CLINICIAN: Sallee Lange, MD  TECHNOLOGIST: Laretta Alstrom ELECTROMYOGRAPHER: Earlean Polka. Penumalli, MD  CLINICAL INFORMATION: 81 year old male with 10 year history of bilateral hand numbness and tingling. Symptoms can affect either right or left side. Symptoms can occur with driving, sitting or sleeping.   FINDINGS: NERVE CONDUCTION STUDY: Bilateral median motor responses have prolonged distal latencies (right 7.2 ms, left 7.6 ms), normal amplitude, conduction velocity and F-wave latencies.  Bilateral ulnar motor responses and F wave latencies are normal.  Bilateral median sensory responses have decreased amplitudes and slow conduction velocities.   NEEDLE ELECTROMYOGRAPHY: Needle examination of right upper extremity deltoid, biceps, triceps, flexor carpi radialis, first dorsal interosseous muscles is normal. Right C6-7 paraspinal muscles are normal.   IMPRESSION:  Abnormal study demonstrate: - Bilateral median neuropathies at the wrist consistent with moderate bilateral carpal tunnel syndrome.    INTERPRETING PHYSICIAN:  Penni Bombard, MD Certified in Neurology, Neurophysiology and Neuroimaging  [facility] [address], [place], [place] [address] [phone]

## 2015-07-15 ENCOUNTER — Other Ambulatory Visit: Payer: Self-pay | Admitting: Cardiovascular Disease

## 2015-07-15 DIAGNOSIS — I739 Peripheral vascular disease, unspecified: Secondary | ICD-10-CM

## 2015-07-15 NOTE — Addendum Note (Signed)
Addended by: Ofilia Neas R on: 07/15/2015 04:00 PM   Modules accepted: Orders

## 2015-07-17 ENCOUNTER — Telehealth: Payer: Self-pay | Admitting: Family Medicine

## 2015-07-17 ENCOUNTER — Other Ambulatory Visit: Payer: Self-pay | Admitting: *Deleted

## 2015-07-17 ENCOUNTER — Ambulatory Visit (HOSPITAL_COMMUNITY)
Admission: RE | Admit: 2015-07-17 | Discharge: 2015-07-17 | Disposition: A | Discharge status: Home / Self Care | Payer: PPO | Source: Ambulatory Visit | Attending: Internal Medicine | Admitting: Internal Medicine

## 2015-07-17 DIAGNOSIS — I6529 Occlusion and stenosis of unspecified carotid artery: Secondary | ICD-10-CM | POA: Diagnosis not present

## 2015-07-17 DIAGNOSIS — I10 Essential (primary) hypertension: Secondary | ICD-10-CM | POA: Diagnosis not present

## 2015-07-17 DIAGNOSIS — I739 Peripheral vascular disease, unspecified: Secondary | ICD-10-CM

## 2015-07-17 DIAGNOSIS — R9439 Abnormal result of other cardiovascular function study: Secondary | ICD-10-CM | POA: Insufficient documentation

## 2015-07-17 DIAGNOSIS — E785 Hyperlipidemia, unspecified: Secondary | ICD-10-CM

## 2015-07-17 DIAGNOSIS — Z01818 Encounter for other preprocedural examination: Secondary | ICD-10-CM | POA: Diagnosis not present

## 2015-07-17 DIAGNOSIS — Z6829 Body mass index (BMI) 29.0-29.9, adult: Secondary | ICD-10-CM | POA: Diagnosis not present

## 2015-07-17 DIAGNOSIS — E663 Overweight: Secondary | ICD-10-CM | POA: Diagnosis not present

## 2015-07-17 DIAGNOSIS — Z87891 Personal history of nicotine dependence: Secondary | ICD-10-CM | POA: Diagnosis not present

## 2015-07-17 DIAGNOSIS — Z8249 Family history of ischemic heart disease and other diseases of the circulatory system: Secondary | ICD-10-CM | POA: Insufficient documentation

## 2015-07-17 LAB — MYOCARDIAL PERFUSION IMAGING
CSEPPHR: 78 {beats}/min
LVDIAVOL: 83 mL (ref 62–150)
LVSYSVOL: 45 mL
Rest HR: 59 {beats}/min
SDS: 5
SRS: 13
SSS: 16
TID: 1.11

## 2015-07-17 MED ORDER — TRAMADOL HCL 50 MG PO TABS: 50.0000 mg | ORAL_TABLET | Freq: Three times a day (TID) | ORAL | Status: DC

## 2015-07-17 MED ORDER — TECHNETIUM TC 99M SESTAMIBI GENERIC - CARDIOLITE
29.2000 | Freq: Once | INTRAVENOUS | Status: AC | PRN
  Administered 2015-07-17: 29.2 via INTRAVENOUS

## 2015-07-17 MED ORDER — REGADENOSON 0.4 MG/5ML IV SOLN
0.4000 mg | Freq: Once | INTRAVENOUS | Status: AC
  Administered 2015-07-17: 0.4 mg via INTRAVENOUS

## 2015-07-17 MED ORDER — TECHNETIUM TC 99M SESTAMIBI GENERIC - CARDIOLITE
10.2000 | Freq: Once | INTRAVENOUS | Status: AC | PRN
  Administered 2015-07-17: 10.2 via INTRAVENOUS

## 2015-07-17 NOTE — Telephone Encounter (Signed)
It is a controlled medication. 90 days prescription I don't think is possible for this medicine. Therefore and stick with 90 tablets may have 5 refills

## 2015-07-17 NOTE — Telephone Encounter (Signed)
Pt is needing a refill on his tramadol sent in. Wife is wanting to know if more than one month can be called in at a time.     [facility]

## 2015-07-17 NOTE — Telephone Encounter (Signed)
Last seen 06/12/15

## 2015-07-17 NOTE — Telephone Encounter (Signed)
Discussed with pt. Med sent to pharm.  

## 2015-07-18 ENCOUNTER — Telehealth: Payer: Self-pay

## 2015-07-18 ENCOUNTER — Encounter: Payer: Self-pay | Admitting: Family Medicine

## 2015-07-18 ENCOUNTER — Ambulatory Visit (INDEPENDENT_AMBULATORY_CARE_PROVIDER_SITE_OTHER): Payer: PPO | Admitting: Family Medicine

## 2015-07-18 VITALS — BP 128/82 | Temp 97.9°F | Ht 68.0 in | Wt 187.0 lb

## 2015-07-18 DIAGNOSIS — N410 Acute prostatitis: Secondary | ICD-10-CM | POA: Diagnosis not present

## 2015-07-18 DIAGNOSIS — R319 Hematuria, unspecified: Secondary | ICD-10-CM

## 2015-07-18 LAB — POCT URINALYSIS DIPSTICK
Blood, UA: 500
Spec Grav, UA: 1.025
pH, UA: 5

## 2015-07-18 MED ORDER — CIPROFLOXACIN HCL 500 MG PO TABS: 500.0000 mg | ORAL_TABLET | Freq: Two times a day (BID) | ORAL | Status: DC

## 2015-07-18 MED ORDER — CLOPIDOGREL BISULFATE 75 MG PO TABS: 75.0000 mg | ORAL_TABLET | Freq: Every day | ORAL | Status: DC

## 2015-07-18 MED ORDER — PRAVASTATIN SODIUM 20 MG PO TABS: 20.0000 mg | ORAL_TABLET | Freq: Every day | ORAL | Status: DC

## 2015-07-18 MED ORDER — METOPROLOL TARTRATE 50 MG PO TABS: 50.0000 mg | ORAL_TABLET | Freq: Two times a day (BID) | ORAL | Status: DC

## 2015-07-18 MED ORDER — AMLODIPINE BESYLATE 10 MG PO TABS: 10.0000 mg | ORAL_TABLET | Freq: Every day | ORAL | Status: DC

## 2015-07-18 NOTE — Progress Notes (Signed)
   Subjective:    Patient ID: [name] [name], male    DOB: 01-Jun-1934, 81 y.o.   MRN: [id number]  Pt comes in with wife Pamala Hurry.   Hematuria This is a new problem. Episode onset: one week. Irritative symptoms include frequency and urgency. Associated symptoms include dysuria.   Has had incr freq the lat wk with pos dysuria  Pos pain and discomfort  Has sen blood in urine  Nocturia frequently the past wk  Pt had   Pos fever Review of Systems  Genitourinary: Positive for dysuria, urgency, frequency and hematuria.       Objective:   Physical Exam Alert no major distress lungs clear heart regular rhythm abdomen benign prostate boggy and tender  2-3 white blood cells per high-power field       Assessment & Plan:  Impression urinary tract infection with probable acute prostatitis plan antibiotics prescribed symptom care discussed warning signs discussed

## 2015-07-18 NOTE — Telephone Encounter (Signed)
Left message to call back Pt had abnormal stress test and needs appt within week with Gwenlyn Found to discuss.

## 2015-07-18 NOTE — Telephone Encounter (Signed)
-----   Message from Lorretta Harp, MD sent at 07/18/2015  9:41 AM EDT ----- Return office visit with me to discuss results at next available

## 2015-07-22 ENCOUNTER — Ambulatory Visit (HOSPITAL_COMMUNITY)
Admission: RE | Admit: 2015-07-22 | Discharge: 2015-07-22 | Disposition: A | Discharge status: Home / Self Care | Payer: PPO | Source: Ambulatory Visit | Attending: Cardiovascular Disease | Admitting: Cardiovascular Disease

## 2015-07-22 DIAGNOSIS — I10 Essential (primary) hypertension: Secondary | ICD-10-CM

## 2015-07-22 DIAGNOSIS — E785 Hyperlipidemia, unspecified: Secondary | ICD-10-CM

## 2015-07-22 DIAGNOSIS — I708 Atherosclerosis of other arteries: Secondary | ICD-10-CM | POA: Diagnosis not present

## 2015-07-22 DIAGNOSIS — I700 Atherosclerosis of aorta: Secondary | ICD-10-CM | POA: Insufficient documentation

## 2015-07-22 DIAGNOSIS — I739 Peripheral vascular disease, unspecified: Secondary | ICD-10-CM | POA: Diagnosis not present

## 2015-07-22 DIAGNOSIS — I714 Abdominal aortic aneurysm, without rupture: Secondary | ICD-10-CM | POA: Diagnosis not present

## 2015-07-22 DIAGNOSIS — I6523 Occlusion and stenosis of bilateral carotid arteries: Secondary | ICD-10-CM | POA: Insufficient documentation

## 2015-07-22 DIAGNOSIS — R0989 Other specified symptoms and signs involving the circulatory and respiratory systems: Secondary | ICD-10-CM | POA: Diagnosis not present

## 2015-07-22 DIAGNOSIS — I6529 Occlusion and stenosis of unspecified carotid artery: Secondary | ICD-10-CM

## 2015-07-23 ENCOUNTER — Telehealth: Payer: Self-pay | Admitting: Neurology

## 2015-07-23 NOTE — Telephone Encounter (Signed)
[facility] [employer]. Patient is declining to schedule apt. Thanks Hinton Dyer.

## 2015-07-23 NOTE — Telephone Encounter (Signed)
Referral from [place] at [facility]. Pt is declining to schedule appt with Dr. Erlinda Hong for bilateral numbness.

## 2015-07-23 NOTE — Telephone Encounter (Signed)
Message will be sent to Dr. Erlinda Hong.

## 2015-07-23 NOTE — Telephone Encounter (Signed)
Noted. Thanks.  Rosalin Hawking, MD PhD Stroke Neurology 07/23/2015 4:30 PM

## 2015-07-24 NOTE — Telephone Encounter (Signed)
Pt scheduled on 5/3 with [name] to follow up on dopplers.

## 2015-07-31 ENCOUNTER — Encounter: Payer: Self-pay | Admitting: Cardiovascular Disease

## 2015-07-31 ENCOUNTER — Ambulatory Visit (INDEPENDENT_AMBULATORY_CARE_PROVIDER_SITE_OTHER): Payer: PPO | Admitting: Cardiovascular Disease

## 2015-07-31 VITALS — BP 170/80 | HR 59 | Ht 68.0 in | Wt 187.0 lb

## 2015-07-31 DIAGNOSIS — I10 Essential (primary) hypertension: Secondary | ICD-10-CM

## 2015-07-31 DIAGNOSIS — I739 Peripheral vascular disease, unspecified: Secondary | ICD-10-CM

## 2015-07-31 DIAGNOSIS — Z79899 Other long term (current) drug therapy: Secondary | ICD-10-CM

## 2015-07-31 DIAGNOSIS — I6529 Occlusion and stenosis of unspecified carotid artery: Secondary | ICD-10-CM

## 2015-07-31 DIAGNOSIS — Z01818 Encounter for other preprocedural examination: Secondary | ICD-10-CM

## 2015-07-31 NOTE — Assessment & Plan Note (Signed)
History of CAD status post cardiac catheterization 07/02/13 when he presented with an anterior STEMI. I performed aspiration thrombectomy of his LAD, PCI and stenting. Right femoral approach. His EF was in the 50% range. Recent Myoview stress test done for preoperative clearance prior to elective total knee replacement showed apical scar without ischemia. He denies chest pain.

## 2015-07-31 NOTE — Assessment & Plan Note (Signed)
History of peripheral arterial disease status post bilateral iliac intervention for claudication with recent Dopplers that reveals the stents to be widely patent. He denies claudication.

## 2015-07-31 NOTE — Assessment & Plan Note (Signed)
History of carotid artery disease with angiographically documented occlusion of the left internal carotid artery and only moderate right ICA stenosis demonstrated by angiography.We have continued to follow this by duplex ultrasound which most recently performed 07/22/15 shows significant progression of disease. Based on this, I'm going to perform CT angiography was carotid arteries to further evaluate this. If indeed the CT angiogram shows high-grade right ICA stenosis he would be a candidate for PTA and stenting.

## 2015-07-31 NOTE — Patient Instructions (Signed)
Medication Instructions:  Your physician recommends that you continue on your current medications as directed. Please refer to the Current Medication list given to you today.   Labwork: Your physician recommends that you return for lab work in: [place]. The lab can be found on the FIRST FLOOR of out building in [address]   Testing/Procedures:  CT Angiography NECK (CTA) FOR EVALUATION OF CAROTID ARTERIES, is a special type of CT scan that uses a computer to produce multi-dimensional views of major blood vessels throughout the body. In CT angiography, a contrast material is injected through an IV to help visualize the blood vessels  Follow-Up: Your physician wants you to follow-up in: [place]. You will receive a reminder letter in the mail two months in advance. If you don't receive a letter, please call our office to schedule the follow-up appointment.   Any Other Special Instructions Will Be Listed Below (If Applicable).     If you need a refill on your cardiac medications before your next appointment, please call your pharmacy.

## 2015-07-31 NOTE — Progress Notes (Signed)
[name] [name] returns today for follow-up of his outpatient tests. A Myoview stress test showed apical scar without ischemia which I feel is low risk. Lanoxin E Dopplers reveal his iliac stents to be widely patent. His carotid Dopplers D show significant progression of his right internal carotid artery. Angiogram from a year ago revealing only mild to moderate stenosis and elected to treat him medically at that time. I'm going to get a CT angiogram to further evaluate this.

## 2015-08-01 LAB — BASIC METABOLIC PANEL
BUN: 17 mg/dL (ref 7–25)
CALCIUM: 9.1 mg/dL (ref 8.6–10.3)
CO2: 25 mmol/L (ref 20–31)
CREATININE: 1.03 mg/dL (ref 0.70–1.18)
Chloride: 101 mmol/L (ref 98–110)
Glucose, Bld: 104 mg/dL — ABNORMAL HIGH (ref 65–99)
Potassium: 4.1 mmol/L (ref 3.5–5.3)
Sodium: 137 mmol/L (ref 135–146)

## 2015-08-07 ENCOUNTER — Encounter (HOSPITAL_COMMUNITY): Payer: Self-pay

## 2015-08-07 ENCOUNTER — Ambulatory Visit (HOSPITAL_COMMUNITY)
Admission: RE | Admit: 2015-08-07 | Discharge: 2015-08-07 | Disposition: A | Discharge status: Home / Self Care | Payer: PPO | Source: Ambulatory Visit | Attending: Cardiovascular Disease | Admitting: Cardiovascular Disease

## 2015-08-07 DIAGNOSIS — I6523 Occlusion and stenosis of bilateral carotid arteries: Secondary | ICD-10-CM | POA: Diagnosis not present

## 2015-08-07 DIAGNOSIS — I6521 Occlusion and stenosis of right carotid artery: Secondary | ICD-10-CM | POA: Diagnosis not present

## 2015-08-07 DIAGNOSIS — I6503 Occlusion and stenosis of bilateral vertebral arteries: Secondary | ICD-10-CM | POA: Diagnosis not present

## 2015-08-07 DIAGNOSIS — I6529 Occlusion and stenosis of unspecified carotid artery: Secondary | ICD-10-CM | POA: Diagnosis not present

## 2015-08-07 MED ORDER — IOPAMIDOL (ISOVUE-370) INJECTION 76%
INTRAVENOUS | Status: AC
  Administered 2015-08-07: 50 mL
  Filled 2015-08-07: qty 50

## 2015-08-12 ENCOUNTER — Other Ambulatory Visit: Payer: Self-pay | Admitting: *Deleted

## 2015-08-12 MED ORDER — METOPROLOL TARTRATE 50 MG PO TABS: 50.0000 mg | ORAL_TABLET | Freq: Two times a day (BID) | ORAL | Status: DC

## 2015-09-03 ENCOUNTER — Encounter: Payer: Self-pay | Admitting: Cardiovascular Disease

## 2015-09-03 ENCOUNTER — Ambulatory Visit (INDEPENDENT_AMBULATORY_CARE_PROVIDER_SITE_OTHER): Payer: PPO | Admitting: Cardiovascular Disease

## 2015-09-03 VITALS — BP 183/83 | HR 61 | Ht 68.0 in | Wt 188.2 lb

## 2015-09-03 DIAGNOSIS — I10 Essential (primary) hypertension: Secondary | ICD-10-CM | POA: Diagnosis not present

## 2015-09-03 DIAGNOSIS — I6522 Occlusion and stenosis of left carotid artery: Secondary | ICD-10-CM

## 2015-09-03 NOTE — Progress Notes (Signed)
History of carotid artery disease with known occlusion of his left internal carotid artery and high-grade right ICA stenosis. I angiogram to him 2/16/16revealing a 40-50% smooth right ICA stenosis. I thought that his Dopplers were falsely elevated because of contralateral occlusion. Did have "water shed infarct related to his right internal carotid artery. His recent Doppler suggested progression of disease and a CT angiogram performed 08/07/15 showed 80% right ICA stenosis representing a progression from his last CT performed in 2012. He is neurologically symptomatically and he remains on dual antibiotic therapy. He has had a thrombotic occlusion of his proximal LAD requiring thrombectomy and stenting. I believe he would be relatively high risk to interrupt this and therefore feel he would be a better carotid artery stent candidate. I'm referring him to Dr. Trula Slade for surgical evaluation

## 2015-09-03 NOTE — Patient Instructions (Signed)
Medication Instructions:  Your physician recommends that you continue on your current medications as directed. Please refer to the Current Medication list given to you today.   Labwork: NONE  Testing/Procedures: NONE  Follow-Up: Your physician recommends that you schedule a follow-up appointment in: [place].  You have been referred to [facility] FOR YOUR CAROTID ARTERIES.   Any Other Special Instructions Will Be Listed Below (If Applicable).     If you need a refill on your cardiac medications before your next appointment, please call your pharmacy.

## 2015-09-03 NOTE — Assessment & Plan Note (Signed)
History of carotid artery disease with known occlusion of his left internal carotid artery and high-grade right ICA stenosis. I angiogram to him 2/16/16revealing a 40-50% smooth right ICA stenosis. I thought that his Dopplers were falsely elevated because of contralateral occlusion. Did have "water shed infarct related to his right internal carotid artery. His recent Doppler suggested progression of disease and a CT angiogram performed 08/07/15 showed 80% right ICA stenosis representing a progression from his last CT performed in 2012. He is neurologically symptomatically and he remains on dual antibiotic therapy. He has had a thrombotic occlusion of his proximal LAD requiring thrombectomy and stenting. I believe he would be relatively high risk to interrupt this and therefore feel he would be a better carotid artery stent candidate. I'm referring him to Dr. Trula Slade for surgical evaluation

## 2015-09-05 ENCOUNTER — Encounter: Payer: Self-pay | Admitting: Vascular Surgery

## 2015-09-12 ENCOUNTER — Encounter: Payer: PPO | Admitting: Vascular Surgery

## 2015-09-16 ENCOUNTER — Encounter: Payer: Self-pay | Admitting: Surgery

## 2015-09-23 ENCOUNTER — Encounter: Payer: Self-pay | Admitting: Surgery

## 2015-09-23 ENCOUNTER — Ambulatory Visit (INDEPENDENT_AMBULATORY_CARE_PROVIDER_SITE_OTHER): Payer: PPO | Admitting: Surgery

## 2015-09-23 VITALS — BP 153/75 | HR 60 | Temp 97.9°F | Resp 18 | Ht 68.0 in | Wt 187.0 lb

## 2015-09-23 DIAGNOSIS — I6521 Occlusion and stenosis of right carotid artery: Secondary | ICD-10-CM | POA: Diagnosis not present

## 2015-09-23 NOTE — Progress Notes (Signed)
Filed Vitals:   09/23/15 1442 09/23/15 1444 09/23/15 1447  BP: 170/79 150/75 153/75  Pulse: 59 60 60  Temp: 97.9 F (36.6 C)    Resp: 18    Height: 5\' 8"  (1.727 m)    Weight: 187 lb (84.823 kg)    SpO2: 96%

## 2015-09-23 NOTE — Progress Notes (Signed)
[facility] [place]  Patient name: [name] [name] MRN: [id number] DOB: April 02, 1934 Sex: male  REFERRING PHYSICIAN: [name]  REASON FOR CONSULT: Carotid stenosis  HPI: [name] [name] is a 81 y.o. male, who is referred for evaluation of carotid stenosis.  The patient has a history of stroke.  He suffered a left carotid occlusion and had right-sided weakness.  An February 2016, he had a right brain stroke.  He underwent angiography by [name] which revealed a 40-50 percent smooth right carotid stenosis, and therefore no intervention was performed.  Patient has remained asymptomatic.  He has had progression of his right carotid stenosis by both ultrasound and CT scan.  The patient suffers from peripheral vascular disease.  He has undergone percutaneous revascularization bilaterally.  He is also status post MI at age 82 and 69.  He has had stenting 2.  He is medically managed for hypercholesterolemia with a statin.  He is a former smoker.  Past Medical History  Diagnosis Date  . Hypertension   . CAD (coronary artery disease) 2015    a. STEMI 2002 s/p stent to PDA. b. Anterior STEMI 06/2013 s/p  asp thrombectomy, DES to prox LAD, EF preserved.  . Stroke [facility]) ~ 2012    a. 2012 - infarct. No bleed.  . Arthritis     "all over" (09/04/2013)  . Thrombocytopenia ([place])   . PVD (peripheral vascular disease) with claudication ([place]) 2015    a. 08/2013: s/p diamondback orbital rotational atherectomy and 8 mm x 30 mm long ICast covered stent to calcified ostial right common iliac artery. b. 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  . Hyperlipidemia   . Carotid artery disease ([place]) 10/2013     Total occlusion of the LICA, [id number] stenosis of the RICA    Family History  Problem Relation Age of Onset  . Diabetes Mother   . Heart disease Mother   . Hyperlipidemia Mother   . Hypertension  Mother   . Heart disease Father   . Hyperlipidemia Father   . Hypertension Father   . Heart attack Father   . Diabetes Sister   . Heart disease Sister   . Hyperlipidemia Sister   . Hypertension Sister   . Diabetes Brother   . Heart disease Brother     before age 62  . Hyperlipidemia Brother   . Hypertension Brother   . Heart attack Brother     SOCIAL HISTORY: Social History   Social History  . Marital Status: Married    Spouse Name: N/A  . Number of Children: 3  . Years of Education: ASSOCIATES   Occupational History  . Retired    Social History Main Topics  . Smoking status: Former Smoker -- 0.50 packs/day for 7 years    Types: Cigarettes  . Smokeless tobacco: Never Used     Comment: 09/04/2013 "quit smoking cigarettes ~ age [age]"  . Alcohol Use: No  . Drug Use: No  . Sexual Activity: Not Currently   Other Topics Concern  . Not on file   Social History Narrative   Patient is married with 3 children.   Patient is right handed.   Patient has his Associates degree.   Patient drinks 2-3 cups daily.    Allergies  Allergen Reactions  . Brilinta [Ticagrelor] Shortness Of Breath    SOB  . Influenza Vaccines     Pt states he has  been hospitalized both times he was given flu vaccine as a younger adult while in the [employer] and they told him not to take it again  . Lipitor [Atorvastatin] Other (See Comments)    Muscle and joint pain    Current Outpatient Prescriptions  Medication Sig Dispense Refill  . acetaminophen (TYLENOL) 500 MG tablet Take 500 mg by mouth every 6 (six) hours as needed for mild pain or moderate pain.     [name] amLODipine (NORVASC) 10 MG tablet Take 1 tablet (10 mg total) by mouth daily. 90 tablet 0  . clopidogrel (PLAVIX) 75 MG tablet Take 1 tablet (75 mg total) by mouth daily. 90 tablet 0  . methylcellulose (ARTIFICIAL TEARS) 1 % ophthalmic solution Place 1 drop into both eyes as needed (for dry eyes).    . metoprolol (LOPRESSOR) 50 MG tablet Take 1  tablet (50 mg total) by mouth 2 (two) times daily. 180 tablet 0  . nitroGLYCERIN (NITROSTAT) 0.4 MG SL tablet Place 1 tablet (0.4 mg total) under the tongue every 5 (five) minutes as needed for chest pain. 25 tablet 2  . pantoprazole (PROTONIX) 20 MG tablet Take one po BID x 14 days then daily 90 tablet 1  . pravastatin (PRAVACHOL) 20 MG tablet Take 1 tablet (20 mg total) by mouth daily. 90 tablet 0  . traMADol (ULTRAM) 50 MG tablet Take 1 tablet (50 mg total) by mouth 3 (three) times daily. 90 tablet 5  . ferrous sulfate 325 (65 FE) MG tablet Take 1 tablet (325 mg total) by mouth daily with breakfast. (Patient not taking: Reported on 09/23/2015) 90 tablet 3  . IRON PO Take by mouth. Reported on 09/23/2015     No current facility-administered medications for this visit.    REVIEW OF SYSTEMS:  [X]  denotes positive finding, [ ]  denotes negative finding Cardiac  Comments:  Chest pain or chest pressure:    Shortness of breath upon exertion:    Short of breath when lying flat:    Irregular heart rhythm:        Vascular    Pain in calf, thigh, or hip brought on by ambulation:    Pain in feet at night that wakes you up from your sleep:     Blood clot in your veins:    Leg swelling:         Pulmonary    Oxygen at home:    Productive cough:     Wheezing:         Neurologic    Sudden weakness in arms or legs:     Sudden numbness in arms or legs:     Sudden onset of difficulty speaking or slurred speech:    Temporary loss of vision in one eye:     Problems with dizziness:         Gastrointestinal    Blood in stool:     Vomited blood:         Genitourinary    Burning when urinating:     Blood in urine:        Psychiatric    Major depression:         Hematologic    Bleeding problems:    Problems with blood clotting too easily:        Skin    Rashes or ulcers:        Constitutional    Fever or chills:      PHYSICAL EXAM: Filed Vitals:   09/23/15 1442  09/23/15 1444  09/23/15 1447  BP: 170/79 150/75 153/75  Pulse: 59 60 60  Temp: 97.9 F (36.6 C)    Resp: 18    Height: 5\' 8"  (1.727 m)    Weight: 187 lb (84.823 kg)    SpO2: 96%      GENERAL: The patient is a well-nourished male, in no acute distress. The vital signs are documented above. CARDIAC: There is a regular rate and rhythm.  VASCULAR: No carotid bruits PULMONARY: There is good air exchange bilaterally without wheezing or rales. ABDOMEN: Soft and non-tender with normal pitched bowel sounds.  MUSCULOSKELETAL: There are no major deformities or cyanosis. NEUROLOGIC: No focal weakness or paresthesias are detected. SKIN: There are no ulcers or rashes noted. PSYCHIATRIC: The patient has a normal affect.  DATA:  I have reviewed his vascular lab studies which reveal 80-99 percent right carotid stenosis and an occluded left carotid artery.  I have reviewed his CT and exam with the following findings: 1. Severe right ICA origins stenosis, greater than 80% and progressed from the 2012 CTA. 2. Moderate right common carotid artery origin stenosis, similar to the prior CTA allowing for motion artifact. 3. Moderate bilateral vertebral artery origins stenosis, increased on the right. 4. Chronic left ICA occlusion.  MEDICAL ISSUES: Asymptomatic right carotid stenosis in the setting of left carotid occlusion: I discussed the treatment options of stenting versus surgery.  The patient has been deemed a high risk surgical candidate from a cardiology perspective.  I discussed the details of both procedures.  Specifically, regarding stenting, I have reviewed his aortic arch.  There is a fair amount of plaque in this area which certainly concerns me regarding his risk for stroke.  From a surgical perspective, his lesion is on the high side.  He is unable to stop his Plavix and therefore the risk of bleeding is higher.  We also discussed the surgical risk of stroke nerve injury and facial numbness.  I will discuss  this further with [name] but plans are to place him on the schedule for a right carotid stent with distal embolic protection.  He'll continue his aspirin and Plavix.   Annamarie Major, MD [facility] [phone] Pager [phone]

## 2015-10-03 ENCOUNTER — Telehealth: Payer: Self-pay | Admitting: Cardiovascular Disease

## 2015-10-03 NOTE — Telephone Encounter (Signed)
He needs Carotid stenting and has seen Dr Trula Slade. Just waiting to arrange with both of our schedules and pre authorization.  JJB

## 2015-10-03 NOTE — Telephone Encounter (Signed)
Pt aware of recommendations, voiced thanks. Advised to call office if he has further questions.

## 2015-10-03 NOTE — Telephone Encounter (Signed)
New message   What decision has been made and when is the first time available to schedule the stent for his blockage

## 2015-10-03 NOTE — Telephone Encounter (Signed)
Reviewed notes, and spoke w/ patient. He's anxious to hear any news regarding recommended intervention for his carotid stenosis. Had been aware Dr. Trula Slade and [name] needed to discuss intervention risks and put together a plan.  Will route for advice - pt voiced thanks in advance for any new information.

## 2015-10-04 ENCOUNTER — Other Ambulatory Visit: Payer: Self-pay

## 2015-10-14 ENCOUNTER — Other Ambulatory Visit: Payer: Self-pay | Admitting: *Deleted

## 2015-10-14 MED ORDER — AMLODIPINE BESYLATE 10 MG PO TABS: 10.0000 mg | ORAL_TABLET | Freq: Every day | ORAL | Status: DC

## 2015-10-14 MED ORDER — CLOPIDOGREL BISULFATE 75 MG PO TABS: 75.0000 mg | ORAL_TABLET | Freq: Every day | ORAL | Status: DC

## 2015-10-14 MED ORDER — PRAVASTATIN SODIUM 20 MG PO TABS: 20.0000 mg | ORAL_TABLET | Freq: Every day | ORAL | Status: DC

## 2015-10-15 ENCOUNTER — Telehealth: Payer: Self-pay | Admitting: Family Medicine

## 2015-10-15 NOTE — Telephone Encounter (Signed)
error 

## 2015-10-29 ENCOUNTER — Encounter (HOSPITAL_COMMUNITY)
Admission: RE | Disposition: A | Discharge status: Home / Self Care | Payer: Self-pay | Source: Ambulatory Visit | Attending: Surgery

## 2015-10-29 ENCOUNTER — Ambulatory Visit (HOSPITAL_COMMUNITY)
Admission: RE | Admit: 2015-10-29 | Discharge: 2015-10-29 | Disposition: A | Discharge status: Home / Self Care | Payer: PPO | Source: Ambulatory Visit | Attending: Surgery | Admitting: Surgery

## 2015-10-29 ENCOUNTER — Encounter (HOSPITAL_COMMUNITY): Payer: Self-pay | Admitting: Surgery

## 2015-10-29 DIAGNOSIS — I10 Essential (primary) hypertension: Secondary | ICD-10-CM | POA: Insufficient documentation

## 2015-10-29 DIAGNOSIS — Z955 Presence of coronary angioplasty implant and graft: Secondary | ICD-10-CM | POA: Diagnosis not present

## 2015-10-29 DIAGNOSIS — Z8673 Personal history of transient ischemic attack (TIA), and cerebral infarction without residual deficits: Secondary | ICD-10-CM | POA: Insufficient documentation

## 2015-10-29 DIAGNOSIS — Z96652 Presence of left artificial knee joint: Secondary | ICD-10-CM | POA: Diagnosis not present

## 2015-10-29 DIAGNOSIS — D696 Thrombocytopenia, unspecified: Secondary | ICD-10-CM | POA: Diagnosis not present

## 2015-10-29 DIAGNOSIS — I6521 Occlusion and stenosis of right carotid artery: Secondary | ICD-10-CM | POA: Diagnosis not present

## 2015-10-29 DIAGNOSIS — I700 Atherosclerosis of aorta: Secondary | ICD-10-CM | POA: Diagnosis not present

## 2015-10-29 DIAGNOSIS — E7800 Pure hypercholesterolemia, unspecified: Secondary | ICD-10-CM | POA: Diagnosis not present

## 2015-10-29 DIAGNOSIS — Z7902 Long term (current) use of antithrombotics/antiplatelets: Secondary | ICD-10-CM | POA: Diagnosis not present

## 2015-10-29 DIAGNOSIS — Z833 Family history of diabetes mellitus: Secondary | ICD-10-CM | POA: Insufficient documentation

## 2015-10-29 DIAGNOSIS — Z8249 Family history of ischemic heart disease and other diseases of the circulatory system: Secondary | ICD-10-CM | POA: Diagnosis not present

## 2015-10-29 DIAGNOSIS — I739 Peripheral vascular disease, unspecified: Secondary | ICD-10-CM | POA: Diagnosis not present

## 2015-10-29 DIAGNOSIS — Z87891 Personal history of nicotine dependence: Secondary | ICD-10-CM | POA: Insufficient documentation

## 2015-10-29 DIAGNOSIS — I7092 Chronic total occlusion of artery of the extremities: Secondary | ICD-10-CM | POA: Insufficient documentation

## 2015-10-29 DIAGNOSIS — I714 Abdominal aortic aneurysm, without rupture: Secondary | ICD-10-CM | POA: Insufficient documentation

## 2015-10-29 DIAGNOSIS — I6523 Occlusion and stenosis of bilateral carotid arteries: Secondary | ICD-10-CM | POA: Diagnosis not present

## 2015-10-29 DIAGNOSIS — M1990 Unspecified osteoarthritis, unspecified site: Secondary | ICD-10-CM | POA: Diagnosis not present

## 2015-10-29 DIAGNOSIS — I2510 Atherosclerotic heart disease of native coronary artery without angina pectoris: Secondary | ICD-10-CM | POA: Insufficient documentation

## 2015-10-29 DIAGNOSIS — E785 Hyperlipidemia, unspecified: Secondary | ICD-10-CM | POA: Insufficient documentation

## 2015-10-29 DIAGNOSIS — I252 Old myocardial infarction: Secondary | ICD-10-CM | POA: Diagnosis not present

## 2015-10-29 DIAGNOSIS — I6522 Occlusion and stenosis of left carotid artery: Secondary | ICD-10-CM | POA: Diagnosis present

## 2015-10-29 HISTORY — PX: PERIPHERAL VASCULAR CATHETERIZATION: SHX172C

## 2015-10-29 LAB — POCT I-STAT, CHEM 8
BUN: 18 mg/dL (ref 6–20)
CHLORIDE: 105 mmol/L (ref 101–111)
Calcium, Ion: 1.15 mmol/L (ref 1.12–1.23)
Creatinine, Ser: 0.8 mg/dL (ref 0.61–1.24)
Glucose, Bld: 96 mg/dL (ref 65–99)
HCT: 47 % (ref 39.0–52.0)
Hemoglobin: 16 g/dL (ref 13.0–17.0)
POTASSIUM: 4 mmol/L (ref 3.5–5.1)
SODIUM: 141 mmol/L (ref 135–145)
TCO2: 26 mmol/L (ref 0–100)

## 2015-10-29 SURGERY — CAROTID PTA/STENT INTERVENTION
Anesthesia: LOCAL | Laterality: Right

## 2015-10-29 MED ORDER — ASPIRIN 81 MG PO CHEW
CHEWABLE_TABLET | ORAL | Status: AC
  Filled 2015-10-29: qty 1

## 2015-10-29 MED ORDER — LIDOCAINE HCL (PF) 1 % IJ SOLN
INTRAMUSCULAR | Status: AC
  Filled 2015-10-29: qty 30

## 2015-10-29 MED ORDER — HEPARIN (PORCINE) IN NACL 2-0.9 UNIT/ML-% IJ SOLN
INTRAMUSCULAR | Status: AC
  Filled 2015-10-29: qty 1000

## 2015-10-29 MED ORDER — ASPIRIN 81 MG PO CHEW
81.0000 mg | CHEWABLE_TABLET | Freq: Once | ORAL | Status: AC
  Administered 2015-10-29: 81 mg via ORAL

## 2015-10-29 MED ORDER — HYDRALAZINE HCL 20 MG/ML IJ SOLN
10.0000 mg | Freq: Once | INTRAMUSCULAR | Status: AC
  Administered 2015-10-29: 10 mg via INTRAVENOUS

## 2015-10-29 MED ORDER — HYDRALAZINE HCL 20 MG/ML IJ SOLN
INTRAMUSCULAR | Status: AC
  Filled 2015-10-29: qty 1

## 2015-10-29 MED ORDER — CLOPIDOGREL BISULFATE 300 MG PO TABS
ORAL_TABLET | ORAL | Status: AC
  Filled 2015-10-29: qty 1

## 2015-10-29 MED ORDER — CLOPIDOGREL BISULFATE 75 MG PO TABS
75.0000 mg | ORAL_TABLET | Freq: Once | ORAL | Status: AC
  Administered 2015-10-29: 75 mg via ORAL

## 2015-10-29 MED ORDER — ACETAMINOPHEN 325 MG PO TABS: 650.0000 mg | ORAL_TABLET | ORAL | Status: DC | PRN

## 2015-10-29 MED ORDER — HYDRALAZINE HCL 20 MG/ML IJ SOLN
10.0000 mg | INTRAMUSCULAR | Status: DC | PRN
  Administered 2015-10-29: 10 mg via INTRAVENOUS

## 2015-10-29 MED ORDER — ONDANSETRON HCL 4 MG/2ML IJ SOLN: 4.0000 mg | Freq: Four times a day (QID) | INTRAMUSCULAR | Status: DC | PRN

## 2015-10-29 MED ORDER — HEPARIN (PORCINE) IN NACL 2-0.9 UNIT/ML-% IJ SOLN
INTRAMUSCULAR | Status: DC | PRN
  Administered 2015-10-29: 1000 mL via INTRA_ARTERIAL

## 2015-10-29 MED ORDER — LIDOCAINE HCL (PF) 1 % IJ SOLN
INTRAMUSCULAR | Status: DC | PRN
  Administered 2015-10-29: 20 mL

## 2015-10-29 MED ORDER — IODIXANOL 320 MG/ML IV SOLN
INTRAVENOUS | Status: DC | PRN
  Administered 2015-10-29: 60 mL via INTRAVENOUS

## 2015-10-29 MED ORDER — ASPIRIN EC 325 MG PO TBEC: 325.0000 mg | DELAYED_RELEASE_TABLET | Freq: Every day | ORAL | Status: DC

## 2015-10-29 MED ORDER — SODIUM CHLORIDE 0.9 % IV SOLN: INTRAVENOUS | Status: AC

## 2015-10-29 MED ORDER — SODIUM CHLORIDE 0.9 % IV SOLN
INTRAVENOUS | Status: DC
  Administered 2015-10-29: 06:00:00 via INTRAVENOUS

## 2015-10-29 MED ORDER — CLOPIDOGREL BISULFATE 75 MG PO TABS
ORAL_TABLET | ORAL | Status: AC
  Filled 2015-10-29: qty 1

## 2015-10-29 MED ORDER — CLOPIDOGREL BISULFATE 75 MG PO TABS: 75.0000 mg | ORAL_TABLET | Freq: Every day | ORAL | Status: DC

## 2015-10-29 SURGICAL SUPPLY — 10 items
CATH ANGIO 5F JB1 100CM (CATHETERS) ×2 IMPLANT
CATH ANGIO 5F PIGTAIL 100CM (CATHETERS) ×2 IMPLANT
KIT MICROINTRODUCER STIFF 5F (SHEATH) IMPLANT
KIT PV (KITS) ×2 IMPLANT
SHEATH PINNACLE 5F 10CM (SHEATH) ×2 IMPLANT
SYR MEDRAD MARK V 150ML (SYRINGE) ×2 IMPLANT
TRANSDUCER W/STOPCOCK (MISCELLANEOUS) ×2 IMPLANT
TRAY PV CATH (CUSTOM PROCEDURE TRAY) ×2 IMPLANT
WIRE BENTSON .035X145CM (WIRE) IMPLANT
WIRE HITORQ VERSACORE ST 145CM (WIRE) ×2 IMPLANT

## 2015-10-29 NOTE — Interval H&P Note (Signed)
History and Physical Interval Note:  10/29/2015 7:36 AM  [name] [name]  has presented today for surgery, with the diagnosis of Rt Carotid Stenosis  The various methods of treatment have been discussed with the patient and family. After consideration of risks, benefits and other options for treatment, the patient has consented to  Procedure(s): Carotid Stent Intervention (Right) as a surgical intervention .  The patient's history has been reviewed, patient examined, no change in status, stable for surgery.  I have reviewed the patient's chart and labs.  Questions were answered to the patient's satisfaction.     Quay Burow

## 2015-10-29 NOTE — Discharge Instructions (Signed)

## 2015-10-29 NOTE — H&P (Signed)
[facility] [place]  Patient name: [name] [name]                MRN: [id number]                  DOB: Oct 07, 1934                  Sex: male  REFERRING PHYSICIAN: [name]  REASON FOR CONSULT: Carotid stenosis  HPI: [name] [name] is a 81 y.o. male, who is referred for evaluation of carotid stenosis.  The patient has a history of stroke.  He suffered a left carotid occlusion and had right-sided weakness.  An February 2016, he had a right brain stroke.  He underwent angiography by [name] which revealed a 40-50 percent smooth right carotid stenosis, and therefore no intervention was performed.  Patient has remained asymptomatic.  He has had progression of his right carotid stenosis by both ultrasound and CT scan.  The patient suffers from peripheral vascular disease.  He has undergone percutaneous revascularization bilaterally.  He is also status post MI at age 79 and 23.  He has had stenting 2.  He is medically managed for hypercholesterolemia with a statin.  He is a former smoker.       Past Medical History  Diagnosis Date  . Hypertension   . CAD (coronary artery disease) 2015    a. STEMI 2002 s/p stent to PDA. b. Anterior STEMI 06/2013 s/p  asp thrombectomy, DES to prox LAD, EF preserved.  . Stroke [facility]) ~ 2012    a. 2012 - infarct. No bleed.  . Arthritis     "all over" (09/04/2013)  . Thrombocytopenia ([place])   . PVD (peripheral vascular disease) with claudication ([place]) 2015    a. 08/2013: s/p diamondback orbital rotational atherectomy and 8 mm x 30 mm long ICast covered stent to calcified ostial right common iliac artery. b. 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  . Hyperlipidemia   . Carotid artery disease ([place]) 10/2013     Total occlusion of the LICA, [id number] stenosis of the RICA          Family History  Problem Relation Age of Onset  . Diabetes Mother     . Heart disease Mother   . Hyperlipidemia Mother   . Hypertension Mother   . Heart disease Father   . Hyperlipidemia Father   . Hypertension Father   . Heart attack Father   . Diabetes Sister   . Heart disease Sister   . Hyperlipidemia Sister   . Hypertension Sister   . Diabetes Brother   . Heart disease Brother     before age 30  . Hyperlipidemia Brother   . Hypertension Brother   . Heart attack Brother     SOCIAL HISTORY: Social History        Social History  . Marital Status: Married    Spouse Name: N/A  . Number of Children: 3  . Years of Education: ASSOCIATES   Occupational History  . Retired          Social History Main Topics  . Smoking status: Former Smoker -- 0.50 packs/day for 7 years    Types: Cigarettes  . Smokeless tobacco: Never Used     Comment: 09/04/2013 "quit smoking cigarettes ~ age [age]"  . Alcohol Use: No  . Drug Use: No  . Sexual Activity: Not Currently  Other Topics Concern  . Not on file      Social History Narrative   Patient is married with 3 children.   Patient is right handed.   Patient has his Associates degree.   Patient drinks 2-3 cups daily.         Allergies  Allergen Reactions  . Brilinta [Ticagrelor] Shortness Of Breath    SOB  . Influenza Vaccines     Pt states he has been hospitalized both times he was given flu vaccine as a younger adult while in the [employer] and they told him not to take it again  . Lipitor [Atorvastatin] Other (See Comments)    Muscle and joint pain          Current Outpatient Prescriptions  Medication Sig Dispense Refill  . acetaminophen (TYLENOL) 500 MG tablet Take 500 mg by mouth every 6 (six) hours as needed for mild pain or moderate pain.     [name] amLODipine (NORVASC) 10 MG tablet Take 1 tablet (10 mg total) by mouth daily. 90 tablet 0  . clopidogrel (PLAVIX) 75 MG tablet Take 1 tablet (75 mg total) by mouth daily. 90 tablet 0  .  methylcellulose (ARTIFICIAL TEARS) 1 % ophthalmic solution Place 1 drop into both eyes as needed (for dry eyes).    . metoprolol (LOPRESSOR) 50 MG tablet Take 1 tablet (50 mg total) by mouth 2 (two) times daily. 180 tablet 0  . nitroGLYCERIN (NITROSTAT) 0.4 MG SL tablet Place 1 tablet (0.4 mg total) under the tongue every 5 (five) minutes as needed for chest pain. 25 tablet 2  . pantoprazole (PROTONIX) 20 MG tablet Take one po BID x 14 days then daily 90 tablet 1  . pravastatin (PRAVACHOL) 20 MG tablet Take 1 tablet (20 mg total) by mouth daily. 90 tablet 0  . traMADol (ULTRAM) 50 MG tablet Take 1 tablet (50 mg total) by mouth 3 (three) times daily. 90 tablet 5  . ferrous sulfate 325 (65 FE) MG tablet Take 1 tablet (325 mg total) by mouth daily with breakfast. (Patient not taking: Reported on 09/23/2015) 90 tablet 3  . IRON PO Take by mouth. Reported on 09/23/2015     No current facility-administered medications for this visit.    REVIEW OF SYSTEMS:  [X]  denotes positive finding, [ ]  denotes negative finding Cardiac  Comments:  Chest pain or chest pressure:    Shortness of breath upon exertion:    Short of breath when lying flat:    Irregular heart rhythm:        Vascular    Pain in calf, thigh, or hip brought on by ambulation:    Pain in feet at night that wakes you up from your sleep:     Blood clot in your veins:    Leg swelling:         Pulmonary    Oxygen at home:    Productive cough:     Wheezing:         Neurologic    Sudden weakness in arms or legs:     Sudden numbness in arms or legs:     Sudden onset of difficulty speaking or slurred speech:    Temporary loss of vision in one eye:     Problems with dizziness:         Gastrointestinal    Blood in stool:     Vomited blood:         Genitourinary    Burning when  urinating:     Blood in urine:        Psychiatric    Major depression:           Hematologic    Bleeding problems:    Problems with blood clotting too easily:        Skin    Rashes or ulcers:        Constitutional    Fever or chills:      PHYSICAL EXAM:      Filed Vitals:   09/23/15 1442 09/23/15 1444 09/23/15 1447  BP: 170/79 150/75 153/75  Pulse: 59 60 60  Temp: 97.9 F (36.6 C)    Resp: 18    Height: 5\' 8"  (1.727 m)    Weight: 187 lb (84.823 kg)    SpO2: 96%      GENERAL: The patient is a well-nourished male, in no acute distress. The vital signs are documented above. CARDIAC: There is a regular rate and rhythm.  VASCULAR: No carotid bruits PULMONARY: There is good air exchange bilaterally without wheezing or rales. ABDOMEN: Soft and non-tender with normal pitched bowel sounds.  MUSCULOSKELETAL: There are no major deformities or cyanosis. NEUROLOGIC: No focal weakness or paresthesias are detected. SKIN: There are no ulcers or rashes noted. PSYCHIATRIC: The patient has a normal affect.  DATA:  I have reviewed his vascular lab studies which reveal 80-99 percent right carotid stenosis and an occluded left carotid artery.  I have reviewed his CT and exam with the following findings: 1. Severe right ICA origins stenosis, greater than 80% and progressed from the 2012 CTA. 2. Moderate right common carotid artery origin stenosis, similar to the prior CTA allowing for motion artifact. 3. Moderate bilateral vertebral artery origins stenosis, increased on the right. 4. Chronic left ICA occlusion.  MEDICAL ISSUES: Asymptomatic right carotid stenosis in the setting of left carotid occlusion: I discussed the treatment options of stenting versus surgery.  The patient has been deemed a high risk surgical candidate from a cardiology perspective.  I discussed the details of both procedures.  Specifically, regarding stenting, I have reviewed his aortic arch.  There is a fair amount of plaque in this area which  certainly concerns me regarding his risk for stroke.  From a surgical perspective, his lesion is on the high side.  He is unable to stop his Plavix and therefore the risk of bleeding is higher.  We also discussed the surgical risk of stroke nerve injury and facial numbness.  I will discuss this further with [name] but plans are to place him on the schedule for a right carotid stent with distal embolic protection.  He'll continue his aspirin and Plavix.   Annamarie Major, MD [facility] [phone] Pager [phone]        No changes CV:RRR Pulm: CTA Plan left carotid stent  WElls BRabahm

## 2015-10-29 NOTE — Progress Notes (Signed)
Site area: Right groin a 5 french arterial sheath was removed  Site Prior to Removal:  Level 0  Pressure Applied For 20 MINUTES    Bedrest Beginning at 0900am  Manual:   Yes.    Patient Status During Pull:  stable  Post Pull Groin Site:  Level 0  Post Pull Instructions Given:  Yes.    Post Pull Pulses Present:  Yes.    Dressing Applied:  Yes.    Comments:  VS remain stable during sheath pull.  BP  Med effective

## 2015-11-01 ENCOUNTER — Other Ambulatory Visit: Payer: Self-pay | Admitting: *Deleted

## 2015-11-19 NOTE — Pre-Procedure Instructions (Signed)
GISELLE CAPELL  11/19/2015      [facility] - [place], [address] [place] [address] Phone: [phone] Fax: [phone]    Your procedure is scheduled on Wed, Aug 30 @ 8:30 AM  Report to [facility] Admitting at 6:30 AM  Call this number if you have problems the morning of surgery:  [phone]   Remember:  Do not eat food or drink liquids after midnight.  Take these medicines the morning of surgery with A SIP OF WATER Amlodipine(Norvasc),Eye Drops,Metoprolol(Lopressor),Pantoprazole (Protonix),Aspirin,and Tramadol(Ultram-if needed)            No Goody's,BC's,Aleve,Advil,Motrin,Ibuprofen,Fish Oil,or any Herbal Medications.    Do not wear jewelry.  Do not wear lotions, powders, or colognes.               Men may shave face and neck.  Do not bring valuables to the hospital.  [facility] is not responsible for any belongings or valuables.  Contacts, dentures or bridgework may not be worn into surgery.  Leave your suitcase in the car.  After surgery it may be brought to your room.  For patients admitted to the hospital, discharge time will be determined by your treatment team.  Patients discharged the day of surgery will not be allowed to drive home.    [facility]  Before surgery, you can play an important role.  Because skin is not sterile, your skin needs to be as free of germs as possible.  You can reduce the number of germs on you skin by washing with CHG (chlorahexidine gluconate) soap before surgery.  CHG is an antiseptic cleaner which kills germs and bonds with the skin to continue killing germs even after washing.  Please DO NOT use if you have an allergy to CHG or antibacterial soaps.  If your skin becomes reddened/irritated stop using the CHG and inform your nurse when you arrive at Short Stay.  Do not shave (including legs and underarms)  for at least 48 hours prior to the first CHG shower.  You may shave your face.  Please follow these instructions carefully:   1.  Shower with CHG Soap the night before surgery and the                                morning of Surgery.  2.  If you choose to wash your hair, wash your hair first as usual with your       normal shampoo.  3.  After you shampoo, rinse your hair and body thoroughly to remove the                      Shampoo.  4.  Use CHG as you would any other liquid soap.  You can apply chg directly       to the skin and wash gently with scrungie or a clean washcloth.  5.  Apply the CHG Soap to your body ONLY FROM THE NECK DOWN.        Do not use on open wounds or open sores.  Avoid contact with your eyes,       ears, mouth and genitals (private parts).  Wash genitals (private parts)       with your normal soap.  6.  Wash thoroughly, paying special attention to the  area where your surgery        will be performed.  7.  Thoroughly rinse your body with warm water from the neck down.  8.  DO NOT shower/wash with your normal soap after using and rinsing off       the CHG Soap.  9.  Pat yourself dry with a clean towel.            10.  Wear clean pajamas.            11.  Place clean sheets on your bed the night of your first shower and do not        sleep with pets.  Day of Surgery  Do not apply any lotions/deoderants the morning of surgery.  Please wear clean clothes to the hospital/surgery center.    Please read over the following fact sheets that you were given. MRSA Information and Surgical Site Infection Prevention

## 2015-11-20 ENCOUNTER — Encounter (HOSPITAL_COMMUNITY): Payer: Self-pay

## 2015-11-20 ENCOUNTER — Encounter (HOSPITAL_COMMUNITY)
Admission: RE | Admit: 2015-11-20 | Discharge: 2015-11-20 | Disposition: A | Discharge status: Home / Self Care | Payer: PPO | Source: Ambulatory Visit | Attending: Surgery | Admitting: Surgery

## 2015-11-20 DIAGNOSIS — Z01818 Encounter for other preprocedural examination: Secondary | ICD-10-CM | POA: Insufficient documentation

## 2015-11-20 DIAGNOSIS — I10 Essential (primary) hypertension: Secondary | ICD-10-CM | POA: Insufficient documentation

## 2015-11-20 DIAGNOSIS — I2510 Atherosclerotic heart disease of native coronary artery without angina pectoris: Secondary | ICD-10-CM | POA: Diagnosis not present

## 2015-11-20 DIAGNOSIS — E785 Hyperlipidemia, unspecified: Secondary | ICD-10-CM | POA: Diagnosis not present

## 2015-11-20 DIAGNOSIS — E669 Obesity, unspecified: Secondary | ICD-10-CM | POA: Insufficient documentation

## 2015-11-20 DIAGNOSIS — Z7902 Long term (current) use of antithrombotics/antiplatelets: Secondary | ICD-10-CM | POA: Insufficient documentation

## 2015-11-20 DIAGNOSIS — D696 Thrombocytopenia, unspecified: Secondary | ICD-10-CM | POA: Insufficient documentation

## 2015-11-20 DIAGNOSIS — H9190 Unspecified hearing loss, unspecified ear: Secondary | ICD-10-CM | POA: Diagnosis not present

## 2015-11-20 DIAGNOSIS — I6523 Occlusion and stenosis of bilateral carotid arteries: Secondary | ICD-10-CM | POA: Diagnosis not present

## 2015-11-20 DIAGNOSIS — Z955 Presence of coronary angioplasty implant and graft: Secondary | ICD-10-CM | POA: Insufficient documentation

## 2015-11-20 DIAGNOSIS — Z01812 Encounter for preprocedural laboratory examination: Secondary | ICD-10-CM | POA: Diagnosis not present

## 2015-11-20 DIAGNOSIS — Z8673 Personal history of transient ischemic attack (TIA), and cerebral infarction without residual deficits: Secondary | ICD-10-CM | POA: Diagnosis not present

## 2015-11-20 DIAGNOSIS — K219 Gastro-esophageal reflux disease without esophagitis: Secondary | ICD-10-CM | POA: Insufficient documentation

## 2015-11-20 DIAGNOSIS — Z79899 Other long term (current) drug therapy: Secondary | ICD-10-CM | POA: Diagnosis not present

## 2015-11-20 DIAGNOSIS — Z7982 Long term (current) use of aspirin: Secondary | ICD-10-CM | POA: Diagnosis not present

## 2015-11-20 DIAGNOSIS — Z0183 Encounter for blood typing: Secondary | ICD-10-CM | POA: Diagnosis not present

## 2015-11-20 DIAGNOSIS — Z6833 Body mass index (BMI) 33.0-33.9, adult: Secondary | ICD-10-CM | POA: Insufficient documentation

## 2015-11-20 DIAGNOSIS — I252 Old myocardial infarction: Secondary | ICD-10-CM | POA: Insufficient documentation

## 2015-11-20 HISTORY — DX: Pain in unspecified joint: M25.50

## 2015-11-20 HISTORY — DX: Personal history of other diseases of the digestive system: Z87.19

## 2015-11-20 HISTORY — DX: Unspecified hearing loss, unspecified ear: H91.90

## 2015-11-20 HISTORY — DX: Frequency of micturition: R35.0

## 2015-11-20 HISTORY — DX: Hemorrhagic condition, unspecified: D69.9

## 2015-11-20 HISTORY — DX: Effusion, unspecified joint: M25.40

## 2015-11-20 HISTORY — DX: Nocturia: R35.1

## 2015-11-20 HISTORY — DX: Benign prostatic hyperplasia without lower urinary tract symptoms: N40.0

## 2015-11-20 HISTORY — DX: Acute myocardial infarction, unspecified: I21.9

## 2015-11-20 HISTORY — DX: Personal history of other infectious and parasitic diseases: Z86.19

## 2015-11-20 HISTORY — DX: Changes in skin texture: R23.4

## 2015-11-20 HISTORY — DX: Gastro-esophageal reflux disease without esophagitis: K21.9

## 2015-11-20 HISTORY — DX: Personal history of colonic polyps: Z86.010

## 2015-11-20 HISTORY — DX: Personal history of colon polyps, unspecified: Z86.0100

## 2015-11-20 LAB — COMPREHENSIVE METABOLIC PANEL
ALBUMIN: 4 g/dL (ref 3.5–5.0)
ALT: 14 U/L — AB (ref 17–63)
AST: 19 U/L (ref 15–41)
Alkaline Phosphatase: 51 U/L (ref 38–126)
Anion gap: 9 (ref 5–15)
BUN: 14 mg/dL (ref 6–20)
CHLORIDE: 109 mmol/L (ref 101–111)
CO2: 21 mmol/L — AB (ref 22–32)
CREATININE: 0.83 mg/dL (ref 0.61–1.24)
Calcium: 8.9 mg/dL (ref 8.9–10.3)
GFR calc non Af Amer: >60 mL/min (ref >60)
Glucose, Bld: 108 mg/dL — ABNORMAL HIGH (ref 65–99)
Potassium: 4.3 mmol/L (ref 3.5–5.1)
SODIUM: 139 mmol/L (ref 135–145)
Total Bilirubin: 0.7 mg/dL (ref 0.3–1.2)
Total Protein: 6.1 g/dL — ABNORMAL LOW (ref 6.5–8.1)

## 2015-11-20 LAB — URINE MICROSCOPIC-ADD ON
Bacteria, UA: NONE SEEN
Squamous Epithelial / LPF: NONE SEEN

## 2015-11-20 LAB — BLOOD GAS, ARTERIAL
ACID-BASE DEFICIT: 0 mmol/L (ref 0.0–2.0)
Bicarbonate: 24 mEq/L (ref 20.0–24.0)
DRAWN BY: 206361
FIO2: 21
O2 SAT: 95.1 %
PATIENT TEMPERATURE: 98.6
TCO2: 25.2 mmol/L (ref 0–100)
pCO2 arterial: 38.7 mmHg (ref 35.0–45.0)
pH, Arterial: 7.41 (ref 7.350–7.450)
pO2, Arterial: 76.8 mmHg — ABNORMAL LOW (ref 80.0–100.0)

## 2015-11-20 LAB — TYPE AND SCREEN
ABO/RH(D): A POS
Antibody Screen: NEGATIVE

## 2015-11-20 LAB — CBC
HCT: 49.3 % (ref 39.0–52.0)
Hemoglobin: 16.7 g/dL (ref 13.0–17.0)
MCH: 29.9 pg (ref 26.0–34.0)
MCHC: 33.9 g/dL (ref 30.0–36.0)
MCV: 88.4 fL (ref 78.0–100.0)
PLATELETS: 96 10*3/uL — AB (ref 150–400)
RBC: 5.58 MIL/uL (ref 4.22–5.81)
RDW: 13.7 % (ref 11.5–15.5)
WBC: 8.5 10*3/uL (ref 4.0–10.5)

## 2015-11-20 LAB — PROTIME-INR
INR: 1.01
Prothrombin Time: 13.3 seconds (ref 11.4–15.2)

## 2015-11-20 LAB — URINALYSIS, ROUTINE W REFLEX MICROSCOPIC
Bilirubin Urine: NEGATIVE
GLUCOSE, UA: NEGATIVE mg/dL
Hgb urine dipstick: NEGATIVE
KETONES UR: NEGATIVE mg/dL
LEUKOCYTES UA: NEGATIVE
Nitrite: NEGATIVE
PROTEIN: 100 mg/dL — AB
Specific Gravity, Urine: 1.025 (ref 1.005–1.030)
pH: 5 (ref 5.0–8.0)

## 2015-11-20 LAB — SURGICAL PCR SCREEN
MRSA, PCR: NEGATIVE
STAPHYLOCOCCUS AUREUS: NEGATIVE

## 2015-11-20 LAB — ABO/RH: ABO/RH(D): A POS

## 2015-11-20 LAB — APTT: aPTT: 24 seconds (ref 24–36)

## 2015-11-20 MED ORDER — CHLORHEXIDINE GLUCONATE CLOTH 2 % EX PADS: 6.0000 | MEDICATED_PAD | Freq: Once | CUTANEOUS | Status: DC

## 2015-11-20 NOTE — Progress Notes (Addendum)
Cardiologist is Dr.J Berry-last visit in epic from 10-29-15  Medical Md is Dr.Scott Simpson  Echo report in epic from 2015/2016  Stress test report in epic from 07/17/15  Heart cath report in epic from 2015  EKG in epic from 09-03-15  CXR denies in past yr

## 2015-11-21 NOTE — Progress Notes (Signed)
Anesthesia Chart Review: Patient is a 81 year old male scheduled for right carotid endarterectomy on 11/27/2015 by Dr. Trula Slade. Patient with > 80% RICA stenosis and known LICA occlusion. Cardiologist [name] first attempted  right carotid stenting on 10/29/15, but was considered too high risk due to the degree of calcification and tortuosity. He was referred to Dr. Trula Slade for consideration of right carotid endarterectomy.  History includes non-smoker, CAD, inferior STEMI s/p distal RCA stent 07/09/01 and anterior STEMI (thrombotic occlusion) s/p thrombectomy/DES LAD 07/02/13, HLD, HTN, PVD s/p bilateral CIA stents '15, carotid artery disease (LICA occlusion, 123456 RICAS), thrombocytopenia, GERD, CVA (right hemiparesis) 2012 and right brain CVA 04/2014, BPH, hard of hearing, diverticulitis, obesity. Echo and aortic U/S showed the possibility of a very small PFO and a small AAA. PCP is Dr. Sallee Lange.        Meds include amlodipine, aspirin 81 mg, Plavix, Lopressor, nitroglycerin, Protonix, pravastatin, tramadol. Patient to continue aspirin and Plavix perioperatively. (Felt to be high risk to hold Plavix given his history of thrombotic LAD occlusion.)  BP (!) 143/67   Pulse 80   Temp 36.7 C   Resp 20   Wt 188 lb 1.6 oz (85.3 kg)   SpO2 97%   BMI 33.32 kg/m   09/03/15 EKG: NSR, right superior axis deviation, pulmonary disease pattern, septal infarct (age undetermined).  07/17/15 Nuclear stress test: - The left ventricular ejection fraction is mildly decreased (45-54%). - Nuclear stress EF: 45% with inferolateral akinesis. - There is a medium defect of moderate severity present in the basal inferolateral, mid inferolateral, apical lateral and apex location. The defect is partially reversible and consistent with infarct with very minimal peri infarct ischemia. - There is a small defect of mild severity present in the basal inferior location. The defect is non-reversible. This is consistent with  diaphragmatic attenuation. No ischemia noted. - This is an intermediate risk study. ([name] felt it was "low risk". See 07/31/15 note.)  04/03/14 Echo: Study Conclusions - Left ventricle: The cavity size was normal. Wall thickness was increased in a pattern of mild LVH. Systolic function was normal. The estimated ejection fraction was in the range of 55% to 60%. - Aorta: The descending thoracic aorta is heavily calfied, there is plaque noted on the aortic arch. - Mitral valve: Mildly calcified annulus. Mildly thickened leaflets. There was mild regurgitation. The MR vena contracta is 0.2 cm. - Left atrium: The atrium was mildly to moderately dilated. No evidence of thrombus in the atrial cavity or appendage. The appendage was of normal size. Emptying velocity was normal at 64 cm/s. - Right atrium: No evidence of thrombus in the atrial cavity or appendage. - Atrial septum: The interatrial septum appears normal by 2D imaging and color Doppler imaging with no clear shunting. A very mildly positive bubble study suggests a potential small PFO.  07/02/13 Cardiac cath: ANGIOGRAPHIC RESULTS:  1. Left main; normal  2. LAD; 80% segmental proximal after the first large diagonal branch with visible thrombus and TIMI 2 flow. The diagonal branch had a proximal 80% ostial stenosis 3. Left circumflex; nondominant and free of significant disease.  4. Right coronary artery; dominant and occluded proximally. There was grade 2 left-to-right collaterals. 5. Left ventriculography: The overall LVEF estimated  50 % Without wall motion abnormalities. IMPRESSION:Mr. Eichholz has clot in his proximal LAD and a long high-grade lesion with TIMI 2 flow. He has a total RCA with left to right collaterals. We'll plan to perform aspiration thrombectomy,  PTCI stenting using drug-eluting stent, Angiomax, Aggrastat and Brilenta. PCI: Final impression: Successful aspiration thrombectomy, PCI and stenting of proximal LAD in setting of  anterior STEMI with work to balloon time of 20 minutes.  07/22/15 Carotid U/S: Heterogeneous plaque, bilaterally. RICA stenosis has increased from prior exam, now in the 80-99% range. Chronic occlusion of LICA noted. > 50% RECA stenosis. Normal SCAs, bilaterally. Patent vertebral arteries with antegrade flow. (Confirmed by 08/07/15 CTA neck.)   07/22/15 Aorta/IVC/IliacU/S: Impressions:  Technically challenging study. Stable dimensions of infrarenal fusiform AAA in the mid aorta with fixed plaque extending into the lumen, measuring 2.1 cm x 2.0 cm and in the distal aorta measuring 2.2 cm x 2.5 cm. Normal caliber common and external iliac arteries, bilaterally. Aortoiliac atherosclerosis. Patent right common iliac artery stent. Greater than 50% left common and external iliac artery stenosis, status post left common iliac stent placement. Patent IVC.  Preoperative labs noted. Cr 0.83, AST 19, ALT 14. H/H 16.7/49.3, PLT 96K (previously 124-137K since 10/2014; range of 104-211K since 11/2010). PT/PTT WNL. Glucose 108. UA negative leukocytes, nitrites. T&S done.  He reports history of thrombocytopenia, but otherwise no clear details. PLT count is nearly 100K, but lower than previous. Will route results to Dr. Trula Slade and VVS RN Colletta Maryland for his review.  If Dr. Trula Slade feels labs are acceptable and otherwise no acute changes then I would anticipate that he could proceed as planned.  George Hugh [facility] Short Stay Center/Anesthesiology Phone [phone] 11/21/2015 6:29 PM

## 2015-11-27 ENCOUNTER — Inpatient Hospital Stay (HOSPITAL_COMMUNITY): Payer: PPO | Admitting: Vascular Surgery

## 2015-11-27 ENCOUNTER — Inpatient Hospital Stay (HOSPITAL_COMMUNITY)
Admission: RE | Admit: 2015-11-27 | Discharge: 2015-11-28 | DRG: 039 | Disposition: A | Discharge status: Home / Self Care | Payer: PPO | Source: Ambulatory Visit | Attending: Surgery | Admitting: Surgery

## 2015-11-27 ENCOUNTER — Inpatient Hospital Stay (HOSPITAL_COMMUNITY): Payer: PPO | Admitting: Certified Registered"

## 2015-11-27 ENCOUNTER — Telehealth: Payer: Self-pay | Admitting: Surgery

## 2015-11-27 ENCOUNTER — Encounter (HOSPITAL_COMMUNITY)
Admission: RE | Disposition: A | Discharge status: Home / Self Care | Payer: Self-pay | Source: Ambulatory Visit | Attending: Surgery

## 2015-11-27 ENCOUNTER — Encounter (HOSPITAL_COMMUNITY): Payer: Self-pay | Admitting: Urology

## 2015-11-27 DIAGNOSIS — Z833 Family history of diabetes mellitus: Secondary | ICD-10-CM | POA: Diagnosis not present

## 2015-11-27 DIAGNOSIS — I10 Essential (primary) hypertension: Secondary | ICD-10-CM | POA: Diagnosis not present

## 2015-11-27 DIAGNOSIS — I6523 Occlusion and stenosis of bilateral carotid arteries: Secondary | ICD-10-CM | POA: Diagnosis not present

## 2015-11-27 DIAGNOSIS — I252 Old myocardial infarction: Secondary | ICD-10-CM | POA: Diagnosis not present

## 2015-11-27 DIAGNOSIS — I6521 Occlusion and stenosis of right carotid artery: Secondary | ICD-10-CM

## 2015-11-27 DIAGNOSIS — Z79899 Other long term (current) drug therapy: Secondary | ICD-10-CM

## 2015-11-27 DIAGNOSIS — Z8249 Family history of ischemic heart disease and other diseases of the circulatory system: Secondary | ICD-10-CM | POA: Diagnosis not present

## 2015-11-27 DIAGNOSIS — Z7902 Long term (current) use of antithrombotics/antiplatelets: Secondary | ICD-10-CM

## 2015-11-27 DIAGNOSIS — E785 Hyperlipidemia, unspecified: Secondary | ICD-10-CM | POA: Diagnosis present

## 2015-11-27 DIAGNOSIS — Z8673 Personal history of transient ischemic attack (TIA), and cerebral infarction without residual deficits: Secondary | ICD-10-CM

## 2015-11-27 DIAGNOSIS — Z87891 Personal history of nicotine dependence: Secondary | ICD-10-CM | POA: Diagnosis not present

## 2015-11-27 DIAGNOSIS — E7800 Pure hypercholesterolemia, unspecified: Secondary | ICD-10-CM | POA: Diagnosis present

## 2015-11-27 DIAGNOSIS — I2510 Atherosclerotic heart disease of native coronary artery without angina pectoris: Secondary | ICD-10-CM | POA: Diagnosis not present

## 2015-11-27 DIAGNOSIS — K219 Gastro-esophageal reflux disease without esophagitis: Secondary | ICD-10-CM | POA: Diagnosis not present

## 2015-11-27 DIAGNOSIS — I739 Peripheral vascular disease, unspecified: Secondary | ICD-10-CM | POA: Diagnosis not present

## 2015-11-27 DIAGNOSIS — H9190 Unspecified hearing loss, unspecified ear: Secondary | ICD-10-CM | POA: Diagnosis not present

## 2015-11-27 DIAGNOSIS — Z887 Allergy status to serum and vaccine status: Secondary | ICD-10-CM

## 2015-11-27 DIAGNOSIS — Z888 Allergy status to other drugs, medicaments and biological substances status: Secondary | ICD-10-CM

## 2015-11-27 DIAGNOSIS — Z955 Presence of coronary angioplasty implant and graft: Secondary | ICD-10-CM

## 2015-11-27 DIAGNOSIS — I6529 Occlusion and stenosis of unspecified carotid artery: Secondary | ICD-10-CM | POA: Diagnosis present

## 2015-11-27 HISTORY — PX: PATCH ANGIOPLASTY: SHX6230

## 2015-11-27 HISTORY — PX: ENDARTERECTOMY: SHX5162

## 2015-11-27 SURGERY — ENDARTERECTOMY, CAROTID
Anesthesia: General | Site: Neck | Laterality: Right

## 2015-11-27 MED ORDER — METOPROLOL TARTRATE 50 MG PO TABS
50.0000 mg | ORAL_TABLET | Freq: Two times a day (BID) | ORAL | Status: DC
  Administered 2015-11-27 – 2015-11-28 (×2): 50 mg via ORAL
  Filled 2015-11-27 (×2): qty 1

## 2015-11-27 MED ORDER — 0.9 % SODIUM CHLORIDE (POUR BTL) OPTIME
TOPICAL | Status: DC | PRN
  Administered 2015-11-27: 3000 mL

## 2015-11-27 MED ORDER — ASPIRIN EC 81 MG PO TBEC
81.0000 mg | DELAYED_RELEASE_TABLET | Freq: Every day | ORAL | Status: DC
  Administered 2015-11-28: 81 mg via ORAL
  Filled 2015-11-27: qty 1

## 2015-11-27 MED ORDER — ACETAMINOPHEN 325 MG PO TABS: 325.0000 mg | ORAL_TABLET | ORAL | Status: DC | PRN

## 2015-11-27 MED ORDER — GLYCOPYRROLATE 0.2 MG/ML IV SOSY
PREFILLED_SYRINGE | INTRAVENOUS | Status: AC
  Filled 2015-11-27: qty 3

## 2015-11-27 MED ORDER — MORPHINE SULFATE (PF) 2 MG/ML IV SOLN: 2.0000 mg | INTRAVENOUS | Status: DC | PRN

## 2015-11-27 MED ORDER — DEXTROSE 5 % IV SOLN
1.5000 g | INTRAVENOUS | Status: AC
  Administered 2015-11-27: 1.5 g via INTRAVENOUS
  Filled 2015-11-27 (×2): qty 1.5

## 2015-11-27 MED ORDER — EPHEDRINE SULFATE 50 MG/ML IJ SOLN
INTRAMUSCULAR | Status: DC | PRN
  Administered 2015-11-27 (×4): 5 mg via INTRAVENOUS

## 2015-11-27 MED ORDER — PANTOPRAZOLE SODIUM 40 MG PO TBEC
40.0000 mg | DELAYED_RELEASE_TABLET | Freq: Every day | ORAL | Status: DC
  Administered 2015-11-27 – 2015-11-28 (×2): 40 mg via ORAL
  Filled 2015-11-27: qty 1

## 2015-11-27 MED ORDER — SODIUM CHLORIDE 0.9 % IV SOLN
INTRAVENOUS | Status: DC | PRN
  Administered 2015-11-27: 500 mL

## 2015-11-27 MED ORDER — GUAIFENESIN-DM 100-10 MG/5ML PO SYRP: 15.0000 mL | ORAL_SOLUTION | ORAL | Status: DC | PRN

## 2015-11-27 MED ORDER — TRAMADOL HCL 50 MG PO TABS
50.0000 mg | ORAL_TABLET | Freq: Three times a day (TID) | ORAL | Status: DC
  Administered 2015-11-28: 50 mg via ORAL
  Filled 2015-11-27 (×3): qty 1

## 2015-11-27 MED ORDER — SUGAMMADEX SODIUM 200 MG/2ML IV SOLN
INTRAVENOUS | Status: AC
  Filled 2015-11-27: qty 2

## 2015-11-27 MED ORDER — METHYLCELLULOSE 1 % OP SOLN: 1.0000 [drp] | OPHTHALMIC | Status: DC | PRN

## 2015-11-27 MED ORDER — HEPARIN SODIUM (PORCINE) 1000 UNIT/ML IJ SOLN
INTRAMUSCULAR | Status: DC | PRN
  Administered 2015-11-27: 1000 [IU] via INTRAVENOUS
  Administered 2015-11-27: 8500 [IU] via INTRAVENOUS

## 2015-11-27 MED ORDER — ROCURONIUM BROMIDE 10 MG/ML (PF) SYRINGE
PREFILLED_SYRINGE | INTRAVENOUS | Status: AC
  Filled 2015-11-27: qty 10

## 2015-11-27 MED ORDER — AMLODIPINE BESYLATE 10 MG PO TABS
10.0000 mg | ORAL_TABLET | Freq: Every day | ORAL | Status: DC
  Administered 2015-11-28: 10 mg via ORAL
  Filled 2015-11-27: qty 1

## 2015-11-27 MED ORDER — HEMOSTATIC AGENTS (NO CHARGE) OPTIME
TOPICAL | Status: DC | PRN
  Administered 2015-11-27: 1 via TOPICAL

## 2015-11-27 MED ORDER — MAGNESIUM HYDROXIDE 400 MG/5ML PO SUSP: 30.0000 mL | Freq: Every day | ORAL | Status: DC | PRN

## 2015-11-27 MED ORDER — DEXTROSE 5 % IV SOLN
1.5000 g | INTRAVENOUS | Status: AC
  Administered 2015-11-27 (×2): 1.5 g via INTRAVENOUS
  Filled 2015-11-27 (×2): qty 1.5

## 2015-11-27 MED ORDER — SODIUM CHLORIDE 0.9 % IV SOLN: INTRAVENOUS | Status: DC

## 2015-11-27 MED ORDER — POTASSIUM CHLORIDE CRYS ER 20 MEQ PO TBCR: 20.0000 meq | EXTENDED_RELEASE_TABLET | Freq: Every day | ORAL | Status: DC | PRN

## 2015-11-27 MED ORDER — LABETALOL HCL 5 MG/ML IV SOLN: 10.0000 mg | INTRAVENOUS | Status: DC | PRN

## 2015-11-27 MED ORDER — EPHEDRINE 5 MG/ML INJ
INTRAVENOUS | Status: AC
  Filled 2015-11-27: qty 10

## 2015-11-27 MED ORDER — POLYVINYL ALCOHOL 1.4 % OP SOLN: 1.0000 [drp] | OPHTHALMIC | Status: DC | PRN

## 2015-11-27 MED ORDER — FENTANYL CITRATE (PF) 100 MCG/2ML IJ SOLN
INTRAMUSCULAR | Status: AC
  Filled 2015-11-27: qty 4

## 2015-11-27 MED ORDER — LIDOCAINE 2% (20 MG/ML) 5 ML SYRINGE
INTRAMUSCULAR | Status: AC
  Filled 2015-11-27: qty 10

## 2015-11-27 MED ORDER — SUGAMMADEX SODIUM 200 MG/2ML IV SOLN
INTRAVENOUS | Status: DC | PRN
  Administered 2015-11-27: 170 mg via INTRAVENOUS

## 2015-11-27 MED ORDER — PROTAMINE SULFATE 10 MG/ML IV SOLN
INTRAVENOUS | Status: AC
  Filled 2015-11-27: qty 5

## 2015-11-27 MED ORDER — HEPARIN SODIUM (PORCINE) 1000 UNIT/ML IJ SOLN
INTRAMUSCULAR | Status: AC
  Filled 2015-11-27: qty 1

## 2015-11-27 MED ORDER — ONDANSETRON HCL 4 MG/2ML IJ SOLN
INTRAMUSCULAR | Status: DC | PRN
Start: 2015-11-27 — End: 2015-11-27
  Administered 2015-11-27: 4 mg via INTRAVENOUS

## 2015-11-27 MED ORDER — HYDROMORPHONE HCL 1 MG/ML IJ SOLN
INTRAMUSCULAR | Status: AC
Start: 2015-11-27 — End: 2015-11-27
  Filled 2015-11-27: qty 1

## 2015-11-27 MED ORDER — CLOPIDOGREL BISULFATE 75 MG PO TABS
75.0000 mg | ORAL_TABLET | Freq: Every day | ORAL | Status: DC
  Administered 2015-11-28: 75 mg via ORAL
  Filled 2015-11-27: qty 1

## 2015-11-27 MED ORDER — ONDANSETRON HCL 4 MG/2ML IJ SOLN
INTRAMUSCULAR | Status: AC
  Filled 2015-11-27: qty 2

## 2015-11-27 MED ORDER — LACTATED RINGERS IV SOLN
INTRAVENOUS | Status: DC | PRN
  Administered 2015-11-27: 08:00:00 via INTRAVENOUS

## 2015-11-27 MED ORDER — PHENYLEPHRINE HCL 10 MG/ML IJ SOLN
INTRAVENOUS | Status: DC | PRN
  Administered 2015-11-27: 20 ug/min via INTRAVENOUS

## 2015-11-27 MED ORDER — PHENOL 1.4 % MT LIQD: 1.0000 | OROMUCOSAL | Status: DC | PRN

## 2015-11-27 MED ORDER — HYDROMORPHONE HCL 1 MG/ML IJ SOLN
0.2500 mg | INTRAMUSCULAR | Status: DC | PRN
  Administered 2015-11-27: 0.5 mg via INTRAVENOUS

## 2015-11-27 MED ORDER — PROMETHAZINE HCL 25 MG/ML IJ SOLN: 6.2500 mg | INTRAMUSCULAR | Status: DC | PRN

## 2015-11-27 MED ORDER — DOCUSATE SODIUM 100 MG PO CAPS
100.0000 mg | ORAL_CAPSULE | Freq: Every day | ORAL | Status: DC
  Administered 2015-11-28: 100 mg via ORAL
  Filled 2015-11-27: qty 1

## 2015-11-27 MED ORDER — SODIUM CHLORIDE 0.9 % IV SOLN: 500.0000 mL | Freq: Once | INTRAVENOUS | Status: DC | PRN

## 2015-11-27 MED ORDER — PROPOFOL 10 MG/ML IV BOLUS
INTRAVENOUS | Status: AC
  Filled 2015-11-27: qty 40

## 2015-11-27 MED ORDER — METOPROLOL TARTRATE 5 MG/5ML IV SOLN: 2.0000 mg | INTRAVENOUS | Status: DC | PRN

## 2015-11-27 MED ORDER — ROCURONIUM BROMIDE 100 MG/10ML IV SOLN
INTRAVENOUS | Status: DC | PRN
  Administered 2015-11-27: 50 mg via INTRAVENOUS

## 2015-11-27 MED ORDER — HYDRALAZINE HCL 20 MG/ML IJ SOLN: 5.0000 mg | INTRAMUSCULAR | Status: DC | PRN

## 2015-11-27 MED ORDER — PROTAMINE SULFATE 10 MG/ML IV SOLN
INTRAVENOUS | Status: DC | PRN
  Administered 2015-11-27: 10 mg via INTRAVENOUS
  Administered 2015-11-27: 40 mg via INTRAVENOUS

## 2015-11-27 MED ORDER — DEXTROSE 5 % IV SOLN
1.5000 g | Freq: Two times a day (BID) | INTRAVENOUS | Status: DC
  Filled 2015-11-27: qty 1.5

## 2015-11-27 MED ORDER — ONDANSETRON HCL 4 MG/2ML IJ SOLN: 4.0000 mg | Freq: Four times a day (QID) | INTRAMUSCULAR | Status: DC | PRN

## 2015-11-27 MED ORDER — PROPOFOL 10 MG/ML IV BOLUS
INTRAVENOUS | Status: DC | PRN
  Administered 2015-11-27: 100 mg via INTRAVENOUS

## 2015-11-27 MED ORDER — MAGNESIUM SULFATE 2 GM/50ML IV SOLN: 2.0000 g | Freq: Every day | INTRAVENOUS | Status: DC | PRN

## 2015-11-27 MED ORDER — GLYCOPYRROLATE 0.2 MG/ML IJ SOLN
INTRAMUSCULAR | Status: DC | PRN
  Administered 2015-11-27: 0.2 mg via INTRAVENOUS

## 2015-11-27 MED ORDER — SODIUM CHLORIDE 0.9 % IV SOLN
INTRAVENOUS | Status: DC
  Administered 2015-11-27: 75 mL/h via INTRAVENOUS

## 2015-11-27 MED ORDER — NITROGLYCERIN 0.4 MG SL SUBL
0.4000 mg | SUBLINGUAL_TABLET | SUBLINGUAL | Status: DC | PRN
Start: 2015-11-27 — End: 2015-11-28

## 2015-11-27 MED ORDER — ALUM & MAG HYDROXIDE-SIMETH 200-200-20 MG/5ML PO SUSP
15.0000 mL | ORAL | Status: DC | PRN
Start: 2015-11-27 — End: 2015-11-28
  Administered 2015-11-27 (×2): 30 mL via ORAL
  Filled 2015-11-27 (×2): qty 30

## 2015-11-27 MED ORDER — BISACODYL 10 MG RE SUPP: 10.0000 mg | Freq: Every day | RECTAL | Status: DC | PRN

## 2015-11-27 MED ORDER — PRAVASTATIN SODIUM 20 MG PO TABS
20.0000 mg | ORAL_TABLET | Freq: Every day | ORAL | Status: DC
  Administered 2015-11-27 – 2015-11-28 (×2): 20 mg via ORAL
  Filled 2015-11-27 (×2): qty 1

## 2015-11-27 MED ORDER — LIDOCAINE HCL (PF) 1 % IJ SOLN
INTRAMUSCULAR | Status: AC
  Filled 2015-11-27: qty 30

## 2015-11-27 MED ORDER — ACETAMINOPHEN 325 MG RE SUPP: 325.0000 mg | RECTAL | Status: DC | PRN

## 2015-11-27 MED ORDER — LIDOCAINE HCL (CARDIAC) 20 MG/ML IV SOLN
INTRAVENOUS | Status: DC | PRN
  Administered 2015-11-27: 100 mg via INTRAVENOUS

## 2015-11-27 MED ORDER — SODIUM CHLORIDE 0.9 % IV SOLN
0.0125 ug/kg/min | INTRAVENOUS | Status: AC
  Administered 2015-11-27 (×2): .1 ug/kg/min via INTRAVENOUS
  Filled 2015-11-27: qty 2000

## 2015-11-27 SURGICAL SUPPLY — 50 items
CANISTER SUCTION 2500CC (MISCELLANEOUS) ×3 IMPLANT
CATH ROBINSON RED A/P 18FR (CATHETERS) ×3 IMPLANT
CATH SUCT 10FR WHISTLE TIP (CATHETERS) ×3 IMPLANT
CLIP TI MEDIUM 6 (CLIP) ×3 IMPLANT
CLIP TI WIDE RED SMALL 6 (CLIP) ×3 IMPLANT
CRADLE DONUT ADULT HEAD (MISCELLANEOUS) ×3 IMPLANT
DRAIN CHANNEL 15F RND FF W/TCR (WOUND CARE) IMPLANT
ELECT REM PT RETURN 9FT ADLT (ELECTROSURGICAL) ×3
ELECTRODE REM PT RTRN 9FT ADLT (ELECTROSURGICAL) ×1 IMPLANT
EVACUATOR SILICONE 100CC (DRAIN) IMPLANT
GAUZE SPONGE 4X4 12PLY STRL (GAUZE/BANDAGES/DRESSINGS) ×3 IMPLANT
GLOVE BIO SURGEON STRL SZ 6.5 (GLOVE) ×10 IMPLANT
GLOVE BIO SURGEONS STRL SZ 6.5 (GLOVE) ×5
GLOVE BIOGEL PI IND STRL 6.5 (GLOVE) ×2 IMPLANT
GLOVE BIOGEL PI IND STRL 7.5 (GLOVE) ×1 IMPLANT
GLOVE BIOGEL PI INDICATOR 6.5 (GLOVE) ×4
GLOVE BIOGEL PI INDICATOR 7.5 (GLOVE) ×2
GLOVE SS BIOGEL STRL SZ 7.5 (GLOVE) ×1 IMPLANT
GLOVE SUPERSENSE BIOGEL SZ 7.5 (GLOVE) ×2
GLOVE SURG SS PI 7.0 STRL IVOR (GLOVE) ×3 IMPLANT
GLOVE SURG SS PI 7.5 STRL IVOR (GLOVE) ×3 IMPLANT
GOWN STRL REUS W/ TWL LRG LVL3 (GOWN DISPOSABLE) ×5 IMPLANT
GOWN STRL REUS W/ TWL XL LVL3 (GOWN DISPOSABLE) ×2 IMPLANT
GOWN STRL REUS W/TWL LRG LVL3 (GOWN DISPOSABLE) ×10
GOWN STRL REUS W/TWL XL LVL3 (GOWN DISPOSABLE) ×4
HEMOSTAT SNOW SURGICEL 2X4 (HEMOSTASIS) ×3 IMPLANT
INSERT FOGARTY SM (MISCELLANEOUS) IMPLANT
KIT BASIN OR (CUSTOM PROCEDURE TRAY) ×3 IMPLANT
KIT ROOM TURNOVER OR (KITS) ×3 IMPLANT
LIQUID BAND (GAUZE/BANDAGES/DRESSINGS) ×3 IMPLANT
NEEDLE HYPO 25GX1X1/2 BEV (NEEDLE) IMPLANT
NS IRRIG 1000ML POUR BTL (IV SOLUTION) ×9 IMPLANT
PACK CAROTID (CUSTOM PROCEDURE TRAY) ×3 IMPLANT
PAD ARMBOARD 7.5X6 YLW CONV (MISCELLANEOUS) ×6 IMPLANT
PATCH VASC XENOSURE 1CMX6CM (Vascular Products) ×2 IMPLANT
PATCH VASC XENOSURE 1X6 (Vascular Products) ×1 IMPLANT
SHUNT CAROTID BYPASS 10 (VASCULAR PRODUCTS) IMPLANT
SHUNT CAROTID BYPASS 12FRX15.5 (VASCULAR PRODUCTS) IMPLANT
SPONGE INTESTINAL PEANUT (DISPOSABLE) ×3 IMPLANT
SUT ETHILON 3 0 PS 1 (SUTURE) IMPLANT
SUT PROLENE 6 0 BV (SUTURE) ×9 IMPLANT
SUT PROLENE 7 0 BV 1 (SUTURE) IMPLANT
SUT PROLENE 7 0 BV1 MDA (SUTURE) ×3 IMPLANT
SUT SILK 3 0 (SUTURE) ×3
SUT SILK 3-0 18XBRD TIE 12 (SUTURE) ×1 IMPLANT
SUT VIC AB 3-0 SH 27 (SUTURE) ×4
SUT VIC AB 3-0 SH 27X BRD (SUTURE) ×2 IMPLANT
SUT VICRYL 4-0 PS2 18IN ABS (SUTURE) ×3 IMPLANT
SYR CONTROL 10ML LL (SYRINGE) IMPLANT
WATER STERILE IRR 1000ML POUR (IV SOLUTION) ×3 IMPLANT

## 2015-11-27 NOTE — Anesthesia Preprocedure Evaluation (Addendum)
Anesthesia Evaluation  Patient identified by MRN, date of birth, ID band Patient awake    Reviewed: Allergy & Precautions, NPO status , Patient's Chart, lab work & pertinent test results  Airway Mallampati: II  TM Distance: >3 FB Neck ROM: Full    Dental  (+) Teeth Intact, Poor Dentition, Dental Advisory Given   Pulmonary shortness of breath,    breath sounds clear to auscultation       Cardiovascular hypertension, + angina with exertion + CAD, + Past MI and + Peripheral Vascular Disease   Rhythm:Regular Rate:Normal     Neuro/Psych  Neuromuscular disease CVA    GI/Hepatic GERD  ,  Endo/Other    Renal/GU      Musculoskeletal  (+) Arthritis ,   Abdominal   Peds  Hematology   Anesthesia Other Findings   Reproductive/Obstetrics                            Anesthesia Physical Anesthesia Plan  ASA: III  Anesthesia Plan: General   Post-op Pain Management:    Induction: Intravenous  Airway Management Planned: Oral ETT  Additional Equipment: Arterial line  Intra-op Plan:   Post-operative Plan: Extubation in OR  Informed Consent: I have reviewed the patients History and Physical, chart, labs and discussed the procedure including the risks, benefits and alternatives for the proposed anesthesia with the patient or authorized representative who has indicated his/her understanding and acceptance.   Dental advisory given  Plan Discussed with:   Anesthesia Plan Comments:         Anesthesia Quick Evaluation

## 2015-11-27 NOTE — Anesthesia Postprocedure Evaluation (Signed)
Anesthesia Post Note  Patient: [name] [name]  Procedure(s) Performed: Procedure(s) (LRB): RIGHT  CAROTID ARTERY ENDARTERECTOMY (Right) WITH 1CM X 6CM XENOSURE BIOLOGIC PATCH ANGIOPLASTY (Right)  Patient location during evaluation: PACU Anesthesia Type: General Level of consciousness: awake and alert Pain management: pain level controlled Vital Signs Assessment: post-procedure vital signs reviewed and stable Respiratory status: spontaneous breathing, nonlabored ventilation, respiratory function stable and patient connected to nasal cannula oxygen Cardiovascular status: blood pressure returned to baseline and stable Postop Assessment: no signs of nausea or vomiting Anesthetic complications: no    Last Vitals:  Vitals:   11/27/15 1400 11/27/15 1459  BP: (!) 143/60 (!) 155/64  Pulse: (!) 57 (!) 54  Resp: 12 18  Temp:  36.8 C    Last Pain:  Vitals:   11/27/15 1459  TempSrc: Oral  PainSc:                  Antron Seth,JAMES TERRILL

## 2015-11-27 NOTE — Anesthesia Procedure Notes (Signed)
Procedure Name: Intubation Date/Time: 11/27/2015 8:28 AM Performed by: Myna Bright Pre-anesthesia Checklist: Patient identified, Emergency Drugs available, Suction available and Patient being monitored Patient Re-evaluated:Patient Re-evaluated prior to inductionOxygen Delivery Method: Circle System Utilized Preoxygenation: Pre-oxygenation with 100% oxygen Intubation Type: IV induction Ventilation: Mask ventilation without difficulty and Oral airway inserted - appropriate to patient size Grade View: Grade I Tube type: Oral Tube size: 7.5 mm Number of attempts: 1 Airway Equipment and Method: Stylet and Oral airway Placement Confirmation: ETT inserted through vocal cords under direct vision,  positive ETCO2 and breath sounds checked- equal and bilateral Secured at: 23 cm Tube secured with: Tape Dental Injury: Teeth and Oropharynx as per pre-operative assessment  Difficulty Due To: Difficulty was unanticipated Comments: Intubation performed by Verdia Kuba, SRNA

## 2015-11-27 NOTE — Progress Notes (Signed)
MEDICATION RELATED CONSULT NOTE   ABX for Surgical Prophylaxis Renal Dose Adjustment per Pharmacy  Procedure: Right Carotid Endartectomy    Completed: 8/30 1012  Received Zinacef 1.5g prior to procedure at 0840. SCr 0.83 - CrCl ~80 mL/min.  Plan:     -Adjust Zinacef to 1.5g q4h x2 doses   Stephens November, PharmD Clinical Pharmacist 1:51 PM, 11/27/2015

## 2015-11-27 NOTE — Progress Notes (Signed)
Patient complaining of numbness in his left thumb and first finger with MD at bedside. Order to take out arterial line to see if sensation is better. A-Line is out and will continue to monitor.

## 2015-11-27 NOTE — Transfer of Care (Signed)
Immediate Anesthesia Transfer of Care Note  Patient: [name] [name]  Procedure(s) Performed: Procedure(s): RIGHT  CAROTID ARTERY ENDARTERECTOMY (Right) WITH 1CM X 6CM XENOSURE BIOLOGIC PATCH ANGIOPLASTY (Right)  Patient Location: PACU  Anesthesia Type:General  Level of Consciousness: awake, alert  and oriented  Airway & Oxygen Therapy: Patient Spontanous Breathing and Patient connected to nasal cannula oxygen  Post-op Assessment: Report given to RN, Post -op Vital signs reviewed and stable, Patient moving all extremities X 4 and Patient able to stick tongue midline  Post vital signs: Reviewed and stable  Last Vitals:  Vitals:   11/27/15 0633 11/27/15 0638  BP: (!) 193/63 (!) 167/69  Pulse: (!) 58   Resp: 20   Temp: 36.6 C     Last Pain:  Vitals:   11/27/15 0633  TempSrc: Oral         Complications: No apparent anesthesia complications

## 2015-11-27 NOTE — Telephone Encounter (Signed)
-----   Message from Mena Goes, RN sent at 11/27/2015 12:40 PM EDT ----- Regarding: schedule 2 weeks post op   ----- Message ----- From: Gabriel Earing, PA-C Sent: 11/27/2015  10:44 AM To: [facility] Charge Pool  S/p right CEA 11/27/15.  F/u with Dr. Trula Slade in 2 weeks.  Thanks, Aldona Bar

## 2015-11-27 NOTE — H&P (Signed)
[facility] [place]  Patient name: [name] [name]                MRN: [id number]                  DOB: 02-19-1935                  Sex: male  REFERRING PHYSICIAN: [name]  REASON FOR CONSULT: Carotid stenosis  HPI: [name] [name] is a 81 y.o. male, who is referred for evaluation of carotid stenosis.  The patient has a history of stroke.  He suffered a left carotid occlusion and had right-sided weakness.  An February 2016, he had a right brain stroke.  He underwent angiography by [name] which revealed a 40-50 percent smooth right carotid stenosis, and therefore no intervention was performed.  Patient has remained asymptomatic.  He has had progression of his right carotid stenosis by both ultrasound and CT scan.  The patient suffers from peripheral vascular disease.  He has undergone percutaneous revascularization bilaterally.  He is also status post MI at age 75 and 74.  He has had stenting 2.  He is medically managed for hypercholesterolemia with a statin.  He is a former smoker.       Past Medical History  Diagnosis Date  . Hypertension   . CAD (coronary artery disease) 2015    a. STEMI 2002 s/p stent to PDA. b. Anterior STEMI 06/2013 s/p  asp thrombectomy, DES to prox LAD, EF preserved.  . Stroke [facility]) ~ 2012    a. 2012 - infarct. No bleed.  . Arthritis     "all over" (09/04/2013)  . Thrombocytopenia ([place])   . PVD (peripheral vascular disease) with claudication ([place]) 2015    a. 08/2013: s/p diamondback orbital rotational atherectomy and 8 mm x 30 mm long ICast covered stent to calcified ostial right common iliac artery. b. 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  . Hyperlipidemia   . Carotid artery disease ([place]) 10/2013     Total occlusion of the LICA, [id number] stenosis of the RICA          Family History  Problem Relation Age of Onset  . Diabetes Mother   . Heart  disease Mother   . Hyperlipidemia Mother   . Hypertension Mother   . Heart disease Father   . Hyperlipidemia Father   . Hypertension Father   . Heart attack Father   . Diabetes Sister   . Heart disease Sister   . Hyperlipidemia Sister   . Hypertension Sister   . Diabetes Brother   . Heart disease Brother     before age 27  . Hyperlipidemia Brother   . Hypertension Brother   . Heart attack Brother     SOCIAL HISTORY: Social History        Social History  . Marital Status: Married    Spouse Name: N/A  . Number of Children: 3  . Years of Education: ASSOCIATES       Occupational History  . Retired          Social History Main Topics  . Smoking status: Former Smoker -- 0.50 packs/day for 7 years    Types: Cigarettes  . Smokeless tobacco: Never Used     Comment: 09/04/2013 "quit smoking cigarettes ~ age [age]"  . Alcohol Use: No  . Drug Use: No  . Sexual Activity:  Not Currently       Other Topics Concern  . Not on file      Social History Narrative   Patient is married with 3 children.   Patient is right handed.   Patient has his Associates degree.   Patient drinks 2-3 cups daily.         Allergies  Allergen Reactions  . Brilinta [Ticagrelor] Shortness Of Breath    SOB  . Influenza Vaccines     Pt states he has been hospitalized both times he was given flu vaccine as a younger adult while in the [employer] and they told him not to take it again  . Lipitor [Atorvastatin] Other (See Comments)    Muscle and joint pain          Current Outpatient Prescriptions  Medication Sig Dispense Refill  . acetaminophen (TYLENOL) 500 MG tablet Take 500 mg by mouth every 6 (six) hours as needed for mild pain or moderate pain.     [name] amLODipine (NORVASC) 10 MG tablet Take 1 tablet (10 mg total) by mouth daily. 90 tablet 0  . clopidogrel (PLAVIX) 75 MG tablet Take 1 tablet (75 mg total) by mouth daily. 90 tablet 0  .  methylcellulose (ARTIFICIAL TEARS) 1 % ophthalmic solution Place 1 drop into both eyes as needed (for dry eyes).    . metoprolol (LOPRESSOR) 50 MG tablet Take 1 tablet (50 mg total) by mouth 2 (two) times daily. 180 tablet 0  . nitroGLYCERIN (NITROSTAT) 0.4 MG SL tablet Place 1 tablet (0.4 mg total) under the tongue every 5 (five) minutes as needed for chest pain. 25 tablet 2  . pantoprazole (PROTONIX) 20 MG tablet Take one po BID x 14 days then daily 90 tablet 1  . pravastatin (PRAVACHOL) 20 MG tablet Take 1 tablet (20 mg total) by mouth daily. 90 tablet 0  . traMADol (ULTRAM) 50 MG tablet Take 1 tablet (50 mg total) by mouth 3 (three) times daily. 90 tablet 5  . ferrous sulfate 325 (65 FE) MG tablet Take 1 tablet (325 mg total) by mouth daily with breakfast. (Patient not taking: Reported on 09/23/2015) 90 tablet 3  . IRON PO Take by mouth. Reported on 09/23/2015     No current facility-administered medications for this visit.    REVIEW OF SYSTEMS:  [X]  denotes positive finding, [ ]  denotes negative finding Cardiac  Comments:  Chest pain or chest pressure:    Shortness of breath upon exertion:    Short of breath when lying flat:    Irregular heart rhythm:        Vascular    Pain in calf, thigh, or hip brought on by ambulation:    Pain in feet at night that wakes you up from your sleep:     Blood clot in your veins:    Leg swelling:         Pulmonary    Oxygen at home:    Productive cough:     Wheezing:         Neurologic    Sudden weakness in arms or legs:     Sudden numbness in arms or legs:     Sudden onset of difficulty speaking or slurred speech:    Temporary loss of vision in one eye:     Problems with dizziness:         Gastrointestinal    Blood in stool:     Vomited blood:  Genitourinary    Burning when urinating:     Blood in urine:        Psychiatric    Major depression:           Hematologic    Bleeding problems:    Problems with blood clotting too easily:        Skin    Rashes or ulcers:        Constitutional    Fever or chills:      PHYSICAL EXAM:      Filed Vitals:   09/23/15 1442 09/23/15 1444 09/23/15 1447  BP: 170/79 150/75 153/75  Pulse: 59 60 60  Temp: 97.9 F (36.6 C)    Resp: 18    Height: 5\' 8"  (1.727 m)    Weight: 187 lb (84.823 kg)    SpO2: 96%      GENERAL: The patient is a well-nourished male, in no acute distress. The vital signs are documented above. CARDIAC: There is a regular rate and rhythm.  VASCULAR: No carotid bruits PULMONARY: There is good air exchange bilaterally without wheezing or rales. ABDOMEN: Soft and non-tender with normal pitched bowel sounds.  MUSCULOSKELETAL: There are no major deformities or cyanosis. NEUROLOGIC: No focal weakness or paresthesias are detected. SKIN: There are no ulcers or rashes noted. PSYCHIATRIC: The patient has a normal affect.  DATA:  I have reviewed his vascular lab studies which reveal 80-99 percent right carotid stenosis and an occluded left carotid artery.  I have reviewed his CT and exam with the following findings: 1. Severe right ICA origins stenosis, greater than 80% and progressed from the 2012 CTA. 2. Moderate right common carotid artery origin stenosis, similar to the prior CTA allowing for motion artifact. 3. Moderate bilateral vertebral artery origins stenosis, increased on the right. 4. Chronic left ICA occlusion.  MEDICAL ISSUES: Asymptomatic right carotid stenosis in the setting of left carotid occlusion: I discussed the treatment options of stenting versus surgery.  The patient has been deemed a high risk surgical candidate from a cardiology perspective.  I discussed the details of both procedures.  Specifically, regarding stenting, I have reviewed his aortic arch.  There is a fair amount of plaque in this area which  certainly concerns me regarding his risk for stroke.  From a surgical perspective, his lesion is on the high side.  He is unable to stop his Plavix and therefore the risk of bleeding is higher.  We also discussed the surgical risk of stroke nerve injury and facial numbness.  I will discuss this further with [name] but plans are to place him on the schedule for a right carotid stent with distal embolic protection.  He'll continue his aspirin and Plavix.   Annamarie Major, MD [facility] [phone] Pager [phone]   Carotid angio revealed anatomy not favorable for stenting, therefore we are here for CEA.  Risks and benefits again discussed with patient  CV:RRR Pulm:CTA Neuro:  Stable  Plan Right CEA

## 2015-11-27 NOTE — Telephone Encounter (Signed)
Sched appt 9/18 at 9:45. Lm on hm# to inform pt.

## 2015-11-27 NOTE — Op Note (Signed)
Patient name: [name] [name] MRN: [id number] DOB: 1934/12/13 Sex: male  11/27/2015 Pre-operative Diagnosis: Asymptomatic   right carotid stenosis Post-operative diagnosis:  Same Surgeon:  Annamarie Major Assistants:  Yolande Jolly Rhyne Procedure:    right carotid Endarterectomy with bovine pericardial patch angioplasty Anesthesia:  General Blood Loss:  See anesthesia record  Findings:  90 %stenosis; Thrombus:  none  Indications:  The patient has an occluded left ICA.  He has had progression of his right ICA stenosis.  He could not be stented because of his calcification and access issues.  Procedure:  The patient was identified in the holding area and taken to [place] [facility]  The patient was then placed supine on the table.   General endotrachial anesthesia was administered.  The patient was prepped and draped in the usual sterile fashion.  A time out was called and antibiotics were administered.  The incision was made along the anterior border of the right sternocleidomastoid muscle.  Cautery was used to dissect through the subcutaneous tissue.  The platysma muscle was divided with cautery.  The internal jugular vein was exposed along its anterior medial border.  The common facial vein was exposed and then divided between 2-0 silk ties and metal clips.  The common carotid artery was then circumferentially exposed and encircled with an umbilical tape.  The vagus nerve was identified and protected.  Next sharp dissection was used to expose the external carotid artery and the superior thyroid artery.  The were encircled with a blue vessel loop and a 2-0 silk tie respectively.  Finally, the internal carotid was carefully dissected free.  An umbilical tape was placed around the internal carotid artery distal to the diseased segment.  The hypoglossal nerve was visualized throughout and protected.  The patient was given systemic heparinization.  A bovine carotid patch was selected and prepared  on the back table.  A 10 french shunt was also prepared.  After blood pressure readings were appropriate and the heparin had been given time to circulate, the internal carotid artery was occluded with a baby Gregory clamp.  The external and common carotid arteries were then occluded with vascular clamps and the 2-0 tie tightened on the superior thyroid artery.  A #11 blade was used to make an arteriotomy in the common carotid artery.  This was extended with Potts scissors along the anterior and lateral border of the common and internal carotid artery.  Approximately 90% stenosis was identified.  There was no thrombus identified.  The 10 french shunt was then placed.  A kleiner kuntz elevator was used to perform endarterectomy.  An eversion endarterectomy was performed in the external carotid artery.  A good distal endpoint was obtained in the internal carotid artery.  The specimen was removed and sent to pathology.  Heparinized saline was used to irrigate the endarterectomized field.  All potential embolic debris was removed.  Bovine pericardial patch angioplasty was then performed using a running 6-0 Prolene. Just prior to completion of the repair, the shunt was removed. The common internal and external carotid arteries were all appropriately flushed. The artery was again irrigated with heparin saline.  The anastomosis was then secured. The clamp was first released on the external carotid artery followed by the common carotid artery approximately 30 seconds later, bloodflow was reestablish through the internal carotid artery.  Next, a hand-held  Doppler was used to evaluate the signals in the common, external, and internal  carotid arteries, all of which  had appropriate signals. I then administered  50 mg protamine. The wound was then irrigated.  After hemostasis was achieved, the carotid sheath was reapproximated with 3-0 Vicryl. The  platysma muscle was reapproximated with running 3-0 Vicryl. The skin  was  closed with 4-0 Vicryl. Dermabond was placed on the skin. The  patient was then successfully extubated. His neurologic exam was  similar to his preprocedural exam. The patient was then taken to recovery room  in stable condition. There were no complications.     Disposition:  To PACU in stable condition.  Relevant Operative Details:  Very difficult exposure.  Lesion was under the hypoglossal which was mobilized.  Inflamamtory appearance of the plaque which was very adherent.  Shunt utilized  V. Annamarie Major, M.D. [facility] [place] Office: [phone] Pager:  [phone]

## 2015-11-27 NOTE — Progress Notes (Signed)
Pt admitted to [facility] from PACU. Oriented to unit and room. Safety discussed and call bell with in reach. VSS.   Erick Blinks, RN

## 2015-11-27 NOTE — Progress Notes (Signed)
  Day of Surgery Note    Subjective:  Pt's awake and eating jello without difficulty.  States he is having some numbness that comes and goes in his left thumb and 1st finger.   Vitals:   11/27/15 1220 11/27/15 1300  BP: (!) 123/57 133/75  Pulse: (!) 56 (!) 56  Resp: 19 15  Temp:  98 F (36.7 C)    Incisions:   Clean and dry without hematoma Extremities:  Moving all extremities equally Cardiac:  regular Lungs:  Non labored Neuro:  Moving all extremities equally; tongue is midline; no difficulty swallowing.  Motor and sensory are in tact. Left radial arterial line in place.     Assessment/Plan:  This is a 81 y.o. male who is s/p right carotid endarterectomy  -pt is doing well and neurologically in tact.  -eating jello and swallowing without difficulty. -c/o numbness coming and going in left thumb and 1st finger.  Grip is good in left hand without deficit. Discussed with Dr. Trula Slade and will remove art line. -if uneventful night-discharge in the am    Leontine Locket, PA-C 11/27/2015 2:05 PM [phone]

## 2015-11-28 ENCOUNTER — Encounter (HOSPITAL_COMMUNITY): Payer: Self-pay | Admitting: Surgery

## 2015-11-28 LAB — BASIC METABOLIC PANEL
Anion gap: 7 (ref 5–15)
BUN: 12 mg/dL (ref 6–20)
CALCIUM: 8.4 mg/dL — AB (ref 8.9–10.3)
CO2: 29 mmol/L (ref 22–32)
CREATININE: 0.93 mg/dL (ref 0.61–1.24)
Chloride: 106 mmol/L (ref 101–111)
GFR calc Af Amer: >60 mL/min (ref >60)
GLUCOSE: 99 mg/dL (ref 65–99)
Potassium: 4.1 mmol/L (ref 3.5–5.1)
SODIUM: 142 mmol/L (ref 135–145)

## 2015-11-28 LAB — CBC
HCT: 44.5 % (ref 39.0–52.0)
Hemoglobin: 14.6 g/dL (ref 13.0–17.0)
MCH: 29.5 pg (ref 26.0–34.0)
MCHC: 32.8 g/dL (ref 30.0–36.0)
MCV: 89.9 fL (ref 78.0–100.0)
PLATELETS: 77 10*3/uL — AB (ref 150–400)
RBC: 4.95 MIL/uL (ref 4.22–5.81)
RDW: 13.7 % (ref 11.5–15.5)
WBC: 7.8 10*3/uL (ref 4.0–10.5)

## 2015-11-28 NOTE — Progress Notes (Addendum)
  Progress Note    11/28/2015 7:23 AM 1 Day Post-Op  Subjective:  No complaints; left hand numbness improved after removing IV; ready to go home.  Voiding okay, hasn't walked, pain controlled.  afebrile HR  50's-70's  SB/NSR 0000000 systolic [id number] RA  Vitals:   11/27/15 2316 11/28/15 0240  BP: (!) 147/65 (!) 162/55  Pulse: 67 (!) 59  Resp: (!) 22 (!) 23  Temp: 98.3 F (36.8 C) 98.3 F (36.8 C)     Physical Exam: Neuro:  In tact; tongue is midline; swallowing okay without difficulty Lungs:  Non labored Incision:  Clean and dry without hematoma  CBC    Component Value Date/Time   WBC 7.8 11/28/2015 0400   RBC 4.95 11/28/2015 0400   HGB 14.6 11/28/2015 0400   HCT 44.5 11/28/2015 0400   HCT 43.2 10/30/2014 0823   PLT 77 (L) 11/28/2015 0400   PLT 124 (L) 10/30/2014 0823   MCV 89.9 11/28/2015 0400   MCV 78 (L) 10/30/2014 0823   MCH 29.5 11/28/2015 0400   MCHC 32.8 11/28/2015 0400   RDW 13.7 11/28/2015 0400   RDW 17.7 (H) 10/30/2014 0823   LYMPHSABS 1.3 02/12/2015 0949   LYMPHSABS 1.4 10/30/2014 0823   MONOABS 0.6 02/12/2015 0949   EOSABS 0.3 02/12/2015 0949   EOSABS 0.4 10/30/2014 0823   BASOSABS 0.0 02/12/2015 0949   BASOSABS 0.0 10/30/2014 0823    BMET    Component Value Date/Time   NA 142 11/28/2015 0400   NA 143 06/06/2015 0810   K 4.1 11/28/2015 0400   CL 106 11/28/2015 0400   CO2 29 11/28/2015 0400   GLUCOSE 99 11/28/2015 0400   BUN 12 11/28/2015 0400   BUN 12 06/06/2015 0810   CREATININE 0.93 11/28/2015 0400   CREATININE 1.03 07/31/2015 1555   CALCIUM 8.4 (L) 11/28/2015 0400   GFRNONAA >60 11/28/2015 0400   GFRAA >60 11/28/2015 0400     Intake/Output Summary (Last 24 hours) at 11/28/15 0723 Last data filed at 11/28/15 0721  Gross per 24 hour  Intake          1838.75 ml  Output             2900 ml  Net         -1061.25 ml     Assessment/Plan:  This is a 81 y.o. male who is s/p left CEA 1 Day Post-Op  -pt is doing well this am. -pt  neuro exam is in tact -restart aspirin and plavix this morning -pt has not ambulated -pt has voided -f/u with Dr. Trula Slade in 2 weeks. (12/16/15 @ 0945) -pt has Tramadol at home and therefore no Rx will be given.\   Leontine Locket, PA-C [facility] [phone]

## 2015-11-28 NOTE — Progress Notes (Signed)
Discharge instructions reviewed with patient, spouse and daughter.

## 2015-11-28 NOTE — Discharge Summary (Signed)
Discharge Summary     [name] [name] [birth year] 81 y.o. male  [id number]  Admission Date: 11/27/2015  Discharge Date: 11/28/15  Physician: Serafina Mitchell, MD  Admission Diagnosis: Right Carotid stenosis   HPI:   This is a 81 y.o. male who is referred for evaluation of carotid stenosis. The patient has a history of stroke. He suffered a left carotid occlusion and had right-sided weakness. An February 2016, he had a right brain stroke. He underwent angiography by [name] which revealed a 40-50 percent smooth right carotid stenosis, and therefore no intervention was performed. Patient has remained asymptomatic. He has had progression of his right carotid stenosis by both ultrasound and CT scan.  The patient suffers from peripheral vascular disease. He has undergone percutaneous revascularization bilaterally. He is also status post MI at age 55 and 32. He has had stenting 2. He is medically managed for hypercholesterolemia with a statin. He is a former smoker.  Hospital Course:  The patient was admitted to the hospital and taken to the operating room on 11/27/2015 and underwent right carotid endarterectomy.  Intra operative findings revealed a 90% stenosis.     The pt tolerated the procedure well and was transported to the PACU in good condition.   By POD 1, the pt neuro status is in tact.  His Plavix and aspirin are restarted.    The remainder of the hospital course consisted of increasing mobilization and increasing intake of solids without difficulty.    Recent Labs  11/28/15 0400  NA 142  K 4.1  CL 106  CO2 29  GLUCOSE 99  BUN 12  CALCIUM 8.4*    Recent Labs  11/28/15 0400  WBC 7.8  HGB 14.6  HCT 44.5  PLT 77*   No results for input(s): INR in the last 72 hours.   Discharge Instructions    CAROTID Sugery: Call MD for difficulty swallowing or speaking; weakness in arms or legs that is a new symtom; severe headache.  If you have increased  swelling in the neck and/or  are having difficulty breathing, CALL 911    Complete by:  As directed   Call MD for:  redness, tenderness, or signs of infection (pain, swelling, bleeding, redness, odor or green/yellow discharge around incision site)    Complete by:  As directed   Call MD for:  severe or increased pain, loss or decreased feeling  in affected limb(s)    Complete by:  As directed   Call MD for:  temperature >100.5    Complete by:  As directed   Discharge wound care:    Complete by:  As directed   Shower daily with soap and water starting 11/29/15   Driving Restrictions    Complete by:  As directed   No driving for 2 weeks   Lifting restrictions    Complete by:  As directed   No lifting for 2 weeks   Resume previous diet    Complete by:  As directed      Discharge Diagnosis:  Right Carotid stenosis  Secondary Diagnosis: Patient Active Problem List   Diagnosis Date Noted  . Asymptomatic carotid artery stenosis 11/27/2015  . Carpal tunnel syndrome 06/12/2015  . Special screening for malignant neoplasms, colon 11/08/2014  . Encounter for long-term (current) use of antiplatelets/antithrombotics 11/08/2014  . Anemia, iron deficiency 06/04/2014  . Cerebral infarction due to stenosis of right carotid artery ([place]) 04/12/2014  . Carotid occlusion, left 04/12/2014  . Essential hypertension  04/12/2014  . PVD (peripheral vascular disease) ([place]) 04/12/2014  . Coronary artery disease involving native coronary artery of native heart with angina pectoris ([place]) 04/12/2014  . B12 deficiency 04/12/2014  . Cerebral infarction due to unspecified mechanism   . CVA (cerebral infarction) 03/26/2014  . Stroke ([place]) 03/26/2014  . BPH (benign prostatic hyperplasia) 11/28/2013  . Claudication ([place]) 09/04/2013  . Pre-op exam 08/17/2013  . Dyspnea 08/17/2013  . CAD S/P percutaneous coronary angioplasty 07/25/2013  . AAA (abdominal aortic aneurysm) ([place]) 07/25/2013  . STEMI (ST elevation myocardial  infarction) ([place]) 07/02/2013  . Peripheral vascular disease ([place]) 07/02/2013  . Occlusion and stenosis of carotid artery with cerebral infarction 10/25/2012  . Hyperlipidemia 09/06/2012  . Essential hypertension, benign 09/06/2012  . Arthritis 09/06/2012  . History of stroke 09/06/2012   Past Medical History:  Diagnosis Date  . Arthritis    "all over" (09/04/2013)  . Bleeds easily ([place])    d/t being on Plavix and ASA per pt  . CAD (coronary artery disease) 2015   a. STEMI 2002 s/p stent to PDA. b. Anterior STEMI 06/2013 s/p  asp thrombectomy, DES to prox LAD, EF preserved.  . Carotid artery disease ([place]) 10/2013    Total occlusion of the LICA, [id number] stenosis of the RICA  . Enlarged prostate   . GERD (gastroesophageal reflux disease)    takes Pantoprazole daily  . Hard of hearing    no hearing aides  . History of colon polyps    benign  . History of diverticulitis   . History of shingles   . Hyperlipidemia    takes Pravastatin daily  . Hypertension    takes Amlodipine and Metoprolol daily  . Joint pain   . Joint swelling   . Myocardial infarction [facility]) at age 55 and other 07/05/11  . Nocturia   . PVD (peripheral vascular disease) with claudication ([place]) 2015   a. 08/2013: s/p diamondback orbital rotational atherectomy and 8 mm x 30 mm long ICast covered stent to calcified ostial right common iliac artery. b. 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  . Stroke ([place]) ~ 2012    x 2:right side is weaker  . Thin skin   . Thrombocytopenia ([place])   . Urinary frequency       Medication List    TAKE these medications   acetaminophen 500 MG tablet Commonly known as:  TYLENOL Take 500 mg by mouth every 6 (six) hours as needed for mild pain or moderate pain.   amLODipine 10 MG tablet Commonly known as:  NORVASC Take 1 tablet (10 mg total) by mouth daily.   aspirin EC 81 MG tablet Take 81 mg by mouth daily.   clopidogrel 75 MG  tablet Commonly known as:  PLAVIX Take 1 tablet (75 mg total) by mouth daily.   methylcellulose 1 % ophthalmic solution Commonly known as:  ARTIFICIAL TEARS Place 1 drop into both eyes as needed (for dry eyes).   metoprolol 50 MG tablet Commonly known as:  LOPRESSOR Take 1 tablet (50 mg total) by mouth 2 (two) times daily.   nitroGLYCERIN 0.4 MG SL tablet Commonly known as:  NITROSTAT Place 1 tablet (0.4 mg total) under the tongue every 5 (five) minutes as needed for chest pain.   pantoprazole 20 MG tablet Commonly known as:  PROTONIX Take one po BID x 14 days then daily   pravastatin 20 MG tablet Commonly known as:  PRAVACHOL Take  1 tablet (20 mg total) by mouth daily.   traMADol 50 MG tablet Commonly known as:  ULTRAM Take 1 tablet (50 mg total) by mouth 3 (three) times daily.       Prescriptions given: None given-has Tramadol at home  Instructions: 1.  No driving x 2 weeks. 2.  No heavy lifting x 2 weeks. 3.  Shower daily with soap and water.  Disposition: home  Patient's condition: is Good  Follow up: 1. Dr. Trula Slade in 2 weeks.   Leontine Locket, PA-C [facility] [phone]  --- For [facility] use --- Instructions: Press F2 to tab through selections.  Delete question if not applicable.   Modified Rankin score at D/C (0-6): 0  IV medication needed for:  1. Hypertension: No 2. Hypotension: No  Post-op Complications: No  1. Post-op CVA or TIA: No  If yes: Event classification (right eye, left eye, right cortical, left cortical, verterobasilar, other): na/  If yes: Timing of event (intra-op, <6 hrs post-op, >=6 hrs post-op, unknown): n/a  2. CN injury: No  If yes: CN n/a injuried   3. Myocardial infarction: No  If yes: Dx by (EKG or clinical, Troponin): n/a  4.  CHF: No  5.  Dysrhythmia (new): No  6. Wound infection: No  7. Reperfusion symptoms: No  8. Return to OR: No  If yes: return to OR for (bleeding,  neurologic, other CEA incision, other): n/a  Discharge medications: Statin use:  Yes If No: [ ]  For Medical reasons, [ ]  Non-compliant, [ ]  Not-indicated ASA use:  Yes  If No: [ ]  For Medical reasons, [ ]  Non-compliant, [ ]  Not-indicated Beta blocker use:  Yes If No: [ ]  For Medical reasons, [ ]  Non-compliant, [ ]  Not-indicated ACE-Inhibitor use:  No If No: [ ]  For Medical reasons, [ ]  Non-compliant, [ ]  Not-indicated CCB use: yes ARB use:  No P2Y12 Antagonist use: Yes, [ x] Plavix, [ ]  Plasugrel, [ ]  Ticlopinine, [ ]  Ticagrelor, [ ]  Other, [ ]  No for medical reason, [ ]  Non-compliant, [ ]  Not-indicated Anti-coagulant use:  No, [ ]  Warfarin, [ ]  Rivaroxaban, [ ]  Dabigatran, [ ]  Other, [ ]  No for medical reason, [ ]  Non-compliant, [ ]  Not-indicated

## 2015-12-04 ENCOUNTER — Ambulatory Visit (INDEPENDENT_AMBULATORY_CARE_PROVIDER_SITE_OTHER): Payer: PPO | Admitting: Cardiovascular Disease

## 2015-12-04 ENCOUNTER — Encounter: Payer: Self-pay | Admitting: Cardiovascular Disease

## 2015-12-04 VITALS — BP 156/70 | HR 59 | Ht 68.0 in | Wt 188.2 lb

## 2015-12-04 DIAGNOSIS — I739 Peripheral vascular disease, unspecified: Secondary | ICD-10-CM

## 2015-12-04 DIAGNOSIS — Z79899 Other long term (current) drug therapy: Secondary | ICD-10-CM

## 2015-12-04 DIAGNOSIS — E785 Hyperlipidemia, unspecified: Secondary | ICD-10-CM | POA: Diagnosis not present

## 2015-12-04 DIAGNOSIS — Z9889 Other specified postprocedural states: Secondary | ICD-10-CM | POA: Diagnosis not present

## 2015-12-04 DIAGNOSIS — I2102 ST elevation (STEMI) myocardial infarction involving left anterior descending coronary artery: Secondary | ICD-10-CM

## 2015-12-04 NOTE — Assessment & Plan Note (Signed)
History of hypertension blood pressure measures at 156/70. He is on amlodipine and metoprolol. Continue current meds at current dosing

## 2015-12-04 NOTE — Progress Notes (Signed)
12/04/2015 [name] [name]   14-Mar-1935  [id number]  Primary Physician Sallee Lange, MD Primary Cardiologist: Lorretta Harp MD [name] [name], [place], [name]  HPI:  [name] [name] is a 80y/ .o. 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 I last saw him 09/03/15.[name] [name] He was admitted on 07/02/13 for Anterior STEMI. Presenting symptoms included severe epigastric burning pain/ indigestion and left arm pain. Initial troponin was >20.0. He underwent emergent LHC and successful aspiration thrombectomy, PCI and stenting of proximal LAD by [name], via the right femoral artery . The overall LVEF was estimated at 50%, without wall motion abnormalities. He tolerated the procedure well and left the cath lab in stable condition. DAPT with ASA and Brilinta was initiated. He was also placed on a BB, ACE-I and statin. His post PCI course was w/o complication. He was discharged home on 07/05/13.  It should also be noted that at time of emergent LHC, he was found to also have significant peripheral vascular disease with an occluded left iliac and high-grade right common iliac artery stenosis, as well as a small AAA. The patient complained of bilateral LEE. [name] ordered for him to undergo bilateral LEAs. The study was performed in our office on 07/18/13. Findings are as follows: ABI 0.95 on Rt, 0.59 on Lt.the patient underwent angiography, diamondback orbital rotational arthrectomy, PTA and stenting of a highly calcified ostial right common iliac artery stenosis. He had excellent angiographic and clinical result. His Dopplers normalized to no longer has right lower extremity claudication. He wishes to proceed with attempt at percutaneous opacification of his left common iliac chronic total occlusion. He is not tolerating his Brilenta because of shortness of breath and we subsequently transitioned to Plavix. He recently had a left brain stroke thought to  be related to a watershed infarct from a hemodynamically significant right carotid stenosis in the setting of an occluded left carotid. He was evaluated by Dr. Erlinda Hong from [facility]. Dr. Erlinda Hong thought he would be a candidate for carotid stenting should he be found have a right internal carotid artery stenosis of greater than 70%.I intubated him on 05/15/14 revealing at most a 40-50% smooth right internal artery stenosis. I suspect his carotid ultrasound overestimated the degree of stenosis because the contralateral occlusion.  Since I saw him a year ago he's remained stable. He denies chest pain, shortness of breath or claudication. He does want to have a left total knee replacement. A pharmacologic Myoview stress test was intermediate risk with scar and peri-infarct ischemia. His EF is in the 45-55% range. Because of progressively worsening Doppler studies a CT scan scan was performed that showed progression of his right internal carotid artery stenosis compared to his prior CTA. He did have some right common carotid origin stenosis as well. He was neurologically asymptomatic. I did angiogram him approximately 18 months ago revealing at most 50-60% right ICA stenosis. I suspected that the Dopplers overestimated the degree of stenosis because of contralateral occlusion. Because of progression of disease he underwent angiography by myself 10/29/15 with the intent to perform right internal carotid artery stenting. Unfortunately, because of the degree of calcification and tortuosity and was never able to safely access the right common carotid artery and therefore aborted the procedure. The patient also underwent uncomplicated right carotid endarterectomy by Dr. Trula Slade 11/27/15 which he has nicely recovered from.  Current Outpatient Prescriptions  Medication Sig Dispense Refill  .  acetaminophen (TYLENOL) 500 MG tablet Take 500 mg by mouth every 6 (six) hours as needed for mild pain or moderate pain.       [name] [name] amLODipine (NORVASC) 10 MG tablet Take 1 tablet (10 mg total) by mouth daily. 90 tablet 0  . aspirin EC 81 MG tablet Take 81 mg by mouth daily.    . clopidogrel (PLAVIX) 75 MG tablet Take 1 tablet (75 mg total) by mouth daily. 90 tablet 0  . methylcellulose (ARTIFICIAL TEARS) 1 % ophthalmic solution Place 1 drop into both eyes as needed (for dry eyes).    . metoprolol (LOPRESSOR) 50 MG tablet Take 1 tablet (50 mg total) by mouth 2 (two) times daily. 180 tablet 0  . nitroGLYCERIN (NITROSTAT) 0.4 MG SL tablet Place 1 tablet (0.4 mg total) under the tongue every 5 (five) minutes as needed for chest pain. 25 tablet 2  . pantoprazole (PROTONIX) 20 MG tablet Take 20 mg by mouth 2 (two) times daily. X 14 days. Then, take one (1) tablet by mouth daily.    . pravastatin (PRAVACHOL) 20 MG tablet Take 1 tablet (20 mg total) by mouth daily. 90 tablet 0  . traMADol (ULTRAM) 50 MG tablet Take 50 mg by mouth 3 (three) times daily as needed for moderate pain.     No current facility-administered medications for this visit.     Allergies  Allergen Reactions  . Brilinta [Ticagrelor] Shortness Of Breath and Other (See Comments)    VERTIGO  . Influenza Vaccines Other (See Comments)    UNSPECIFIED REACTION Pt states he has been hospitalized both times he was given flu vaccine as a younger adult while in the [employer] and they told him not to take it again  . Lipitor [Atorvastatin] Other (See Comments)    MYALGIAS, JOINT PAIN    Social History   Social History  . Marital status: Married    Spouse name: N/A  . Number of children: 3  . Years of education: ASSOCIATES   Occupational History  . Retired    Social History Main Topics  . Smoking status: Never Smoker  . Smokeless tobacco: Never Used  . Alcohol use No  . Drug use: No  . Sexual activity: Not Currently   Other Topics Concern  . Not on file   Social History Narrative   Patient is married with 3 children.   Patient is right handed.    Patient has his Associates degree.   Patient drinks 2-3 cups daily.     Review of Systems: General: negative for chills, fever, night sweats or weight changes.  Cardiovascular: negative for chest pain, dyspnea on exertion, edema, orthopnea, palpitations, paroxysmal nocturnal dyspnea or shortness of breath Dermatological: negative for rash Respiratory: negative for cough or wheezing Urologic: negative for hematuria Abdominal: negative for nausea, vomiting, diarrhea, bright red blood per rectum, melena, or hematemesis Neurologic: negative for visual changes, syncope, or dizziness All other systems reviewed and are otherwise negative except as noted above.    Blood pressure (!) 156/70, pulse (!) 59, height 5\' 8"  (1.727 m), weight 188 lb 3.2 oz (85.4 kg).  General appearance: alert and no distress Neck: no adenopathy, no JVD, supple, symmetrical, trachea midline, thyroid not enlarged, symmetric, no tenderness/mass/nodules and No carotid bruits, healing right carotid endarterectomy incision Lungs: clear to auscultation bilaterally Heart: regular rate and rhythm, S1, S2 normal, no murmur, click, rub or gallop Extremities: extremities normal, atraumatic, no cyanosis or edema  EKG not performed today  ASSESSMENT  AND PLAN:   Hyperlipidemia History of hyperlipidemia on statin therapy with recent lipid profile performed 06/06/15 revealing an LDL 47 and HDL 29  Essential hypertension, benign History of hypertension blood pressure measures at 156/70. He is on amlodipine and metoprolol. Continue current meds at current dosing  Occlusion and stenosis of carotid artery with cerebral infarction History of carotid artery disease with known occluded left carotid and high-grade right ICA stenosis. Angiogram him 10/29/15 with the intent to perform carotid artery stenting however because of extreme tortuosity and calcification I was unable to complete the procedure and the patient therefore underwent  uncomplicated right carotid endarterectomy by Dr. Trula Slade 11/27/15. He is recovered nicely. We will recheck carotid Doppler studies on him in 6 weeks.  STEMI (ST elevation myocardial infarction) History of CAD status post anterior STEMI 07/02/13 with successful aspiration thrombectomy, PCI and stenting of his proximal LAD. His EF was in the 50% range. His most recent stress test performed 07/17/15 did show a partially reversible defect in the basal inferolateral segment with peri-infarct ischemia. The patient denies chest pain or shortness of breath.  Peripheral vascular disease ([place]) History of peripheral arterial disease status post diamondback orbital rotation atherectomy, PTCA and stenting of his calcified ostial right common iliac artery stenosis with an excellent result he had left iliac intervention July 2015.[name] [name] His most recent Dopplers performed 07/22/15 revealed ABIs of 0.96 bilaterally. His left iliac velocities are moderately elevated.      Lorretta Harp MD FACP,FACC,FAHA, [facility] 12/04/2015 11:42 AM

## 2015-12-04 NOTE — Assessment & Plan Note (Signed)
History of peripheral arterial disease status post diamondback orbital rotation atherectomy, PTCA and stenting of his calcified ostial right common iliac artery stenosis with an excellent result he had left iliac intervention July 2015.[name] His most recent Dopplers performed 07/22/15 revealed ABIs of 0.96 bilaterally. His left iliac velocities are moderately elevated.

## 2015-12-04 NOTE — Patient Instructions (Addendum)
Medication Instructions:  NO CHANGES.  Labwork: Your physician recommends that you return for lab work at your [place]. You will need to be fasting for the labwork. Do not eat or drink past midnight the day of your lab work.   Testing/Procedures: Your physician has requested that you have a carotid duplex. This test is an ultrasound of the carotid arteries in your neck. It looks at blood flow through these arteries that supply the brain with blood. Allow one hour for this exam. There are no restrictions or special instructions.   IN 6 WEEKS.   Follow-Up: Your physician wants you to follow-up in: [place]. You will receive a reminder letter in the mail two months in advance. If you don't receive a letter, please call our office to schedule the follow-up appointment.   If you need a refill on your cardiac medications before your next appointment, please call your pharmacy.

## 2015-12-04 NOTE — Assessment & Plan Note (Signed)
History of CAD status post anterior STEMI 07/02/13 with successful aspiration thrombectomy, PCI and stenting of his proximal LAD. His EF was in the 50% range. His most recent stress test performed 07/17/15 did show a partially reversible defect in the basal inferolateral segment with peri-infarct ischemia. The patient denies chest pain or shortness of breath.

## 2015-12-04 NOTE — Assessment & Plan Note (Signed)
History of carotid artery disease with known occluded left carotid and high-grade right ICA stenosis. Angiogram him 10/29/15 with the intent to perform carotid artery stenting however because of extreme tortuosity and calcification I was unable to complete the procedure and the patient therefore underwent uncomplicated right carotid endarterectomy by Dr. Trula Slade 11/27/15. He is recovered nicely. We will recheck carotid Doppler studies on him in 6 weeks.

## 2015-12-04 NOTE — Assessment & Plan Note (Signed)
History of hyperlipidemia on statin therapy with recent lipid profile performed 06/06/15 revealing an LDL 47 and HDL 29

## 2015-12-05 LAB — HEPATIC FUNCTION PANEL
ALBUMIN: 4.1 g/dL (ref 3.6–5.1)
ALK PHOS: 61 U/L (ref 40–115)
ALT: 13 U/L (ref 9–46)
AST: 15 U/L (ref 10–35)
BILIRUBIN TOTAL: 0.6 mg/dL (ref 0.2–1.2)
Bilirubin, Direct: 0.1 mg/dL (ref <=0.2)
Indirect Bilirubin: 0.5 mg/dL (ref 0.2–1.2)
TOTAL PROTEIN: 6.4 g/dL (ref 6.1–8.1)

## 2015-12-05 LAB — LIPID PANEL
CHOL/HDL RATIO: 5 ratio (ref <=5.0)
CHOLESTEROL: 129 mg/dL (ref 125–200)
HDL: 26 mg/dL — ABNORMAL LOW (ref >=40)
LDL Cholesterol: 57 mg/dL (ref <130)
Triglycerides: 232 mg/dL — ABNORMAL HIGH (ref <150)
VLDL: 46 mg/dL — ABNORMAL HIGH (ref <30)

## 2015-12-12 ENCOUNTER — Encounter: Payer: Self-pay | Admitting: Surgery

## 2015-12-13 ENCOUNTER — Ambulatory Visit (INDEPENDENT_AMBULATORY_CARE_PROVIDER_SITE_OTHER): Payer: PPO | Admitting: Family Medicine

## 2015-12-13 ENCOUNTER — Encounter: Payer: Self-pay | Admitting: Family Medicine

## 2015-12-13 VITALS — BP 124/80 | Ht 68.0 in | Wt 189.0 lb

## 2015-12-13 DIAGNOSIS — I10 Essential (primary) hypertension: Secondary | ICD-10-CM

## 2015-12-13 NOTE — Progress Notes (Signed)
   Subjective:    Patient ID: [name] [name], male    DOB: [birth year], 81 y.o.   MRN: [id number]  Hypertension  This is a chronic problem. The current episode started more than 1 year ago. There are no compliance problems (rides excerise bike every morning, walks).    Pt states no concerns today.   Declines flu vaccine. Pt is allergic to flu vaccine. Patient does take his acid blocker helps keep his reflux under control He does take his Lopressor on a regular basis benign also Plavix and aspirin no bleeding issues. Review of Systems Patient denies any chest tightness pressure pain shortness of breath    Objective:   Physical Exam Lungs lungs are clear hearts regular pulse normal extremities no edema skin warm dry       Assessment & Plan:  HTN good control overall continue current measures  Peripheral vascular disease continue to aggressively eat healthy and stay physically active follow-up with cardiology follow-up with [place] in 6 months

## 2015-12-16 ENCOUNTER — Ambulatory Visit (INDEPENDENT_AMBULATORY_CARE_PROVIDER_SITE_OTHER): Payer: Self-pay | Admitting: Surgery

## 2015-12-16 ENCOUNTER — Encounter: Payer: Self-pay | Admitting: Surgery

## 2015-12-16 VITALS — BP 187/87 | HR 56 | Temp 97.4°F | Ht 68.0 in | Wt 189.3 lb

## 2015-12-16 DIAGNOSIS — I6521 Occlusion and stenosis of right carotid artery: Secondary | ICD-10-CM

## 2015-12-16 NOTE — Progress Notes (Signed)
Patient name: [name] [name] MRN: [id number] DOB: Aug 30, 1934 Sex: male  REASON FOR VISIT: post op  HPI: [name] [name] is a 81 y.o. male  Returns today for follow-up.  He is status post right carotid endarterectomy with bovine pericardial patch angioplasty on 11/27/2015.  The patient has a history of an occluded left internal carotid artery. He was not a stent candidate because of calcification and access issues.  Intraoperative findings included a very difficult exposure.  The lesion was under the hypoglossal nerve which had to be mobilized.  There was an inflammatory appearance to the plaque which was very adherent.   The patient states that he is doing very well.  He has no complaints today.  According to [name] [name] wife, [name] [name]  color and [name] [name] memory are   improved.  Current Outpatient Prescriptions  Medication Sig Dispense Refill  . acetaminophen (TYLENOL) 500 MG tablet Take 500 mg by mouth every 6 (six) hours as needed for mild pain or moderate pain.     [name] amLODipine (NORVASC) 10 MG tablet Take 1 tablet (10 mg total) by mouth daily. 90 tablet 0  . aspirin EC 81 MG tablet Take 81 mg by mouth daily.    . clopidogrel (PLAVIX) 75 MG tablet Take 1 tablet (75 mg total) by mouth daily. 90 tablet 0  . methylcellulose (ARTIFICIAL TEARS) 1 % ophthalmic solution Place 1 drop into both eyes as needed (for dry eyes).    . metoprolol (LOPRESSOR) 50 MG tablet Take 1 tablet (50 mg total) by mouth 2 (two) times daily. 180 tablet 0  . nitroGLYCERIN (NITROSTAT) 0.4 MG SL tablet Place 1 tablet (0.4 mg total) under the tongue every 5 (five) minutes as needed for chest pain. 25 tablet 2  . pantoprazole (PROTONIX) 20 MG tablet Take 20 mg by mouth 2 (two) times daily. X 14 days. Then, take one (1) tablet by mouth daily.    . traMADol (ULTRAM) 50 MG tablet Take 50 mg by mouth 3 (three) times daily as needed for moderate pain.     No current facility-administered medications for this  visit.     REVIEW OF SYSTEMS:  [X]  denotes positive finding, [ ]  denotes negative finding Cardiac  Comments:  Chest pain or chest pressure:    Shortness of breath upon exertion:    Short of breath when lying flat:    Irregular heart rhythm:    Constitutional    Fever or chills:      PHYSICAL EXAM: Vitals:   12/16/15 0956 12/16/15 0957  BP: (!) 179/83 (!) 187/87  Pulse: (!) 56   Temp: 97.4 F (36.3 C)   TempSrc: Oral   SpO2: 96%   Weight: 189 lb 4.8 oz (85.9 kg)   Height: 5\' 8"  (1.727 m)     GENERAL: The patient is a well-nourished male, in no acute distress. The vital signs are documented above. CARDIOVASCULAR: There is a regular rate and rhythm. PULMONARY: There is good air exchange bilaterally without wheezing or rales.  slight marginal mandibular neurapraxia. Otherwise neurologically intact.  Tongue is midline.  Incision is healing nicely  MEDICAL ISSUES:  I discussed with the patient that I will be very vigilant with [name] [name] outpatient follow-up given the inflammatory component to [name] [name] endarterectomy site. He is getting [name] [name] follow-up with [name].  [name] [name] next carotid ultrasound is later this month.  I will see him back in 6 months.  Annamarie Major, MD [facility] [phone]  Pager [phone]

## 2015-12-19 DIAGNOSIS — H35033 Hypertensive retinopathy, bilateral: Secondary | ICD-10-CM | POA: Diagnosis not present

## 2015-12-19 DIAGNOSIS — H5203 Hypermetropia, bilateral: Secondary | ICD-10-CM | POA: Diagnosis not present

## 2015-12-19 DIAGNOSIS — H40013 Open angle with borderline findings, low risk, bilateral: Secondary | ICD-10-CM | POA: Diagnosis not present

## 2015-12-19 DIAGNOSIS — H348322 Tributary (branch) retinal vein occlusion, left eye, stable: Secondary | ICD-10-CM | POA: Diagnosis not present

## 2016-01-09 DIAGNOSIS — H35033 Hypertensive retinopathy, bilateral: Secondary | ICD-10-CM | POA: Diagnosis not present

## 2016-01-09 DIAGNOSIS — H40013 Open angle with borderline findings, low risk, bilateral: Secondary | ICD-10-CM | POA: Diagnosis not present

## 2016-01-09 DIAGNOSIS — Z961 Presence of intraocular lens: Secondary | ICD-10-CM | POA: Diagnosis not present

## 2016-01-15 ENCOUNTER — Ambulatory Visit (HOSPITAL_COMMUNITY)
Admission: RE | Admit: 2016-01-15 | Discharge: 2016-01-15 | Disposition: A | Discharge status: Home / Self Care | Payer: PPO | Source: Ambulatory Visit | Attending: Cardiology | Admitting: Cardiology

## 2016-01-15 DIAGNOSIS — Z9889 Other specified postprocedural states: Secondary | ICD-10-CM | POA: Insufficient documentation

## 2016-01-15 DIAGNOSIS — I6523 Occlusion and stenosis of bilateral carotid arteries: Secondary | ICD-10-CM | POA: Diagnosis not present

## 2016-01-16 ENCOUNTER — Telehealth: Payer: Self-pay | Admitting: *Deleted

## 2016-01-16 DIAGNOSIS — I779 Disorder of arteries and arterioles, unspecified: Secondary | ICD-10-CM

## 2016-01-16 DIAGNOSIS — I739 Peripheral vascular disease, unspecified: Principal | ICD-10-CM

## 2016-01-16 NOTE — Telephone Encounter (Signed)
-----   Message from Lorretta Harp, MD sent at 01/16/2016  3:04 PM EDT ----- Known left internal carotid artery occlusion, while he patent right carotid endarterectomy site, repeat 6 months.

## 2016-01-21 ENCOUNTER — Telehealth: Payer: Self-pay | Admitting: Family Medicine

## 2016-01-21 MED ORDER — METOPROLOL TARTRATE 50 MG PO TABS: 50.0000 mg | ORAL_TABLET | Freq: Two times a day (BID) | ORAL | 0 refills | Status: DC

## 2016-01-21 MED ORDER — AMLODIPINE BESYLATE 10 MG PO TABS: 10.0000 mg | ORAL_TABLET | Freq: Every day | ORAL | 0 refills | Status: DC

## 2016-01-21 MED ORDER — CLOPIDOGREL BISULFATE 75 MG PO TABS: 75.0000 mg | ORAL_TABLET | Freq: Every day | ORAL | 0 refills | Status: DC

## 2016-01-21 NOTE — Telephone Encounter (Signed)
Prescriptions sent electronically to pharmacy. Patient notified. °

## 2016-01-21 NOTE — Telephone Encounter (Signed)
Pt is needing refills on all of his medications.     [facility] - [place], [place]

## 2016-01-22 ENCOUNTER — Telehealth: Payer: Self-pay | Admitting: Family Medicine

## 2016-01-22 NOTE — Telephone Encounter (Signed)
Please talk with the patient and/or his pharmacy to confirm the dosage of the pravastatin that he is taking and how he is taking we can certainly give refills for 6 months. Also protonix can also be filled for 6 months

## 2016-01-22 NOTE — Telephone Encounter (Signed)
Pravastatin is not on patient's med list and that is probably why the electronic system auto rejecting?

## 2016-01-22 NOTE — Telephone Encounter (Signed)
Pt's wife and [name] [name] w/HT[employer] (insurance) called to let us know that another electronic refill request will be coming for pt's PRAVASTATIN - apparently there's been an issue with this being auto rejected.    Also requesting that Dr. Nicki Reaper take over prescribing pt's PANTOPRAZOLE   Please advise

## 2016-01-23 MED ORDER — AMLODIPINE BESYLATE 10 MG PO TABS: 10.0000 mg | ORAL_TABLET | Freq: Every day | ORAL | 1 refills | Status: DC

## 2016-01-23 MED ORDER — CLOPIDOGREL BISULFATE 75 MG PO TABS: 75.0000 mg | ORAL_TABLET | Freq: Every day | ORAL | 1 refills | Status: DC

## 2016-01-23 MED ORDER — PANTOPRAZOLE SODIUM 20 MG PO TBEC: 20.0000 mg | DELAYED_RELEASE_TABLET | Freq: Two times a day (BID) | ORAL | 1 refills | Status: DC

## 2016-01-23 MED ORDER — METOPROLOL TARTRATE 50 MG PO TABS: 50.0000 mg | ORAL_TABLET | Freq: Two times a day (BID) | ORAL | 1 refills | Status: DC

## 2016-01-23 NOTE — Telephone Encounter (Signed)
Per wife - Patient is not on pravastatin- patient is needing a refill on protonix. Prescription sent electronically to pharmacy. Wife notified.

## 2016-01-24 ENCOUNTER — Telehealth: Payer: Self-pay | Admitting: *Deleted

## 2016-01-24 ENCOUNTER — Other Ambulatory Visit: Payer: Self-pay | Admitting: *Deleted

## 2016-01-24 MED ORDER — PANTOPRAZOLE SODIUM 20 MG PO TBEC: 20.0000 mg | DELAYED_RELEASE_TABLET | Freq: Two times a day (BID) | ORAL | 1 refills | Status: DC

## 2016-01-24 NOTE — Telephone Encounter (Signed)
Pharm calling to clarify pantoprazole 20mg  directions. Med sent to pharm with two different directions one daily and one bid. Called pt he states he takes one bid. Is it ok to send it in one bid

## 2016-01-24 NOTE — Telephone Encounter (Signed)
Sure, May send in twice a day dosing.( If insurance denies that we may have to go with once daily but at 40 mg)

## 2016-01-24 NOTE — Telephone Encounter (Signed)
Med sent to pharm 

## 2016-05-13 ENCOUNTER — Telehealth: Payer: Self-pay | Admitting: Family Medicine

## 2016-05-13 MED ORDER — TRAMADOL HCL 50 MG PO TABS: 50.0000 mg | ORAL_TABLET | Freq: Three times a day (TID) | ORAL | 5 refills | Status: DC | PRN

## 2016-05-13 NOTE — Telephone Encounter (Signed)
Requesting Rx for Tramadol to [facility].

## 2016-05-13 NOTE — Telephone Encounter (Signed)
Last seen here 11/2015- not prescribed by our office-listed as historical provider

## 2016-05-13 NOTE — Telephone Encounter (Signed)
I checked the drug registry. He has received this medicine from [place]. It would be fine to use tramadol 50 mg, 1 3 times a day, #90, 5 refills

## 2016-05-13 NOTE — Telephone Encounter (Signed)
Prescription faxed to pharmacy. Patient notified. 

## 2016-06-02 ENCOUNTER — Ambulatory Visit (INDEPENDENT_AMBULATORY_CARE_PROVIDER_SITE_OTHER): Payer: PPO | Admitting: Cardiovascular Disease

## 2016-06-02 ENCOUNTER — Encounter: Payer: Self-pay | Admitting: Cardiovascular Disease

## 2016-06-02 DIAGNOSIS — E7800 Pure hypercholesterolemia, unspecified: Secondary | ICD-10-CM

## 2016-06-02 DIAGNOSIS — I2510 Atherosclerotic heart disease of native coronary artery without angina pectoris: Secondary | ICD-10-CM

## 2016-06-02 DIAGNOSIS — I739 Peripheral vascular disease, unspecified: Secondary | ICD-10-CM

## 2016-06-02 DIAGNOSIS — Z9861 Coronary angioplasty status: Secondary | ICD-10-CM | POA: Diagnosis not present

## 2016-06-02 DIAGNOSIS — I10 Essential (primary) hypertension: Secondary | ICD-10-CM | POA: Diagnosis not present

## 2016-06-02 NOTE — Assessment & Plan Note (Signed)
History of essential hypertension blood pressure measures 198/90. His blood pressure measured home is in the 130/70 range. He is on amlodipine and metoprolol. Continue current meds at current dosing

## 2016-06-02 NOTE — Assessment & Plan Note (Signed)
History of CAD status post anterior STEMI 07/02/13. He presented with epigastric burning/indigestion and left arm pain with a troponin of greater than 20. He underwent cardiac catheterization, aspiration thrombectomy, PCI and stenting of his proximal LAD via the right femoral approach performed by myself appears overall EF was preserved at 50%. He remains asymptomatic on aspirin and Plavix.

## 2016-06-02 NOTE — Assessment & Plan Note (Signed)
History of hyperlipidemia not on statin therapy with lipid profile performed 12/04/15 revealed a total cholesterol 129, LDL 57 and HDL of 26.

## 2016-06-02 NOTE — Patient Instructions (Addendum)
Medication Instructions: Your physician recommends that you continue on your current medications as directed. Please refer to the Current Medication list given to you today.   Testing/Procedures: Schedule Carotid Doppler and LEA/ABI for April 2018.  Follow-Up: Your physician wants you to follow-up in: 1 year with [name]. You will receive a reminder letter in the mail two months in advance. If you don't receive a letter, please call our office to schedule the follow-up appointment.  If you need a refill on your cardiac medications before your next appointment, please call your pharmacy.

## 2016-06-02 NOTE — Assessment & Plan Note (Signed)
History of carotid artery disease with known occlusion of the left internal carotid artery status post right carotid endarterectomy performed by Dr. Trula Slade 11/27/15 which she is recovered from nicely and healed. Doppler studies performed 01/15/16 revealed a widely patent endarterectomy site. This will be followed on a semiannual basis.

## 2016-06-02 NOTE — Progress Notes (Signed)
06/02/2016 [name]   [birth year]  [id number]  Primary Physician Sallee Lange, MD Primary Cardiologist: Lorretta Harp MD [name], [place], [name]  HPI:  [name] [name] is a 47. 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 I last saw him 12/04/15.[name] He was admitted on 07/02/13 for Anterior STEMI. Presenting symptoms included severe epigastric burning pain/ indigestion and left arm pain. Initial troponin was >20.0. He underwent emergent LHC and successful aspiration thrombectomy, PCI and stenting of proximal LAD by [name], via the right femoral artery . The overall LVEF was estimated at 50%, without wall motion abnormalities. He tolerated the procedure well and left the cath lab in stable condition. DAPT with ASA and Brilinta was initiated. He was also placed on a BB, ACE-I and statin. His post PCI course was w/o complication. He was discharged home on 07/05/13.  It should also be noted that at time of emergent LHC, he was found to also have significant peripheral vascular disease with an occluded left iliac and high-grade right common iliac artery stenosis, as well as a small AAA. The patient complained of bilateral LEE. [name] ordered for him to undergo bilateral LEAs. The study was performed in our office on 07/18/13. Findings are as follows: ABI 0.95 on Rt, 0.59 on Lt.the patient underwent angiography, diamondback orbital rotational arthrectomy, PTA and stenting of a highly calcified ostial right common iliac artery stenosis. He had excellent angiographic and clinical result. His Dopplers normalized to no longer has right lower extremity claudication. He wishes to proceed with attempt at percutaneous opacification of his left common iliac chronic total occlusion. He is not tolerating his Brilenta because of shortness of breath and we subsequently transitioned to Plavix. He recently had a left  brain stroke thought to be related to a watershed infarct from a hemodynamically significant right carotid stenosis in the setting of an occluded left carotid. He was evaluated by Dr. Erlinda Hong from [facility]. Dr. Erlinda Hong thought he would be a candidate for carotid stenting should he be found have a right internal carotid artery stenosis of greater than 70%.I intubated him on 05/15/14 revealing at most a 40-50% smooth right internal artery stenosis. I suspect his carotid ultrasound overestimated the degree of stenosis because the contralateral occlusion.  Since I saw him a year ago he's remained stable. He denies chest pain, shortness of breath or claudication. He does want to have a left total knee replacement. A pharmacologic Myoview stress test was intermediate risk with scar and peri-infarct ischemia. His EF is in the 45-55% range. Because of progressively worsening Doppler studies a CT scan scan was performed that showed progression of his right internal carotid artery stenosis compared to his prior CTA. He did have some right common carotid origin stenosis as well. He was neurologically asymptomatic. I did angiogram him approximately 18 months ago revealing at most 50-60% right ICA stenosis. I suspected that the Dopplers overestimated the degree of stenosis because of contralateral occlusion. Because of progression of disease he underwent angiography by myself 10/29/15 with the intent to perform right internal carotid artery stenting. Unfortunately, because of the degree of calcification and tortuosity and was never able to safely access the right common carotid artery and therefore aborted the procedure. The patient also underwent uncomplicated right carotid endarterectomy by Dr. Trula Slade 11/27/15 which he has nicely recovered from. Recent carotid Dopplers performed 01/15/16 revealed  a widely patent right carotid endarterectomy site. He is otherwise asymptomatic denying chest pain, shortness of breath or  claudication.   Current Outpatient Prescriptions  Medication Sig Dispense Refill  . acetaminophen (TYLENOL) 500 MG tablet Take 500 mg by mouth every 6 (six) hours as needed for mild pain or moderate pain.     [name] amLODipine (NORVASC) 10 MG tablet Take 1 tablet (10 mg total) by mouth daily. 90 tablet 1  . aspirin EC 81 MG tablet Take 81 mg by mouth daily.    . clopidogrel (PLAVIX) 75 MG tablet Take 1 tablet (75 mg total) by mouth daily. 90 tablet 1  . methylcellulose (ARTIFICIAL TEARS) 1 % ophthalmic solution Place 1 drop into both eyes as needed (for dry eyes).    . metoprolol (LOPRESSOR) 50 MG tablet Take 1 tablet (50 mg total) by mouth 2 (two) times daily. 180 tablet 1  . nitroGLYCERIN (NITROSTAT) 0.4 MG SL tablet Place 1 tablet (0.4 mg total) under the tongue every 5 (five) minutes as needed for chest pain. 25 tablet 2  . pantoprazole (PROTONIX) 20 MG tablet Take 1 tablet (20 mg total) by mouth 2 (two) times daily. 180 tablet 1  . traMADol (ULTRAM) 50 MG tablet Take 1 tablet (50 mg total) by mouth 3 (three) times daily as needed for moderate pain. 90 tablet 5   No current facility-administered medications for this visit.     Allergies  Allergen Reactions  . Brilinta [Ticagrelor] Shortness Of Breath and Other (See Comments)    VERTIGO  . Influenza Vaccines Other (See Comments)    UNSPECIFIED REACTION Pt states he has been hospitalized both times he was given flu vaccine as a younger adult while in the [employer] and they told him not to take it again  . Lipitor [Atorvastatin] Other (See Comments)    MYALGIAS, JOINT PAIN  . Fluoride Preparations     Social History   Social History  . Marital status: Married    Spouse name: N/A  . Number of children: 3  . Years of education: ASSOCIATES   Occupational History  . Retired    Social History Main Topics  . Smoking status: Never Smoker  . Smokeless tobacco: Never Used  . Alcohol use No  . Drug use: No  . Sexual activity: Not Currently    Other Topics Concern  . Not on file   Social History Narrative   Patient is married with 3 children.   Patient is right handed.   Patient has his Associates degree.   Patient drinks 2-3 cups daily.     Review of Systems: General: negative for chills, fever, night sweats or weight changes.  Cardiovascular: negative for chest pain, dyspnea on exertion, edema, orthopnea, palpitations, paroxysmal nocturnal dyspnea or shortness of breath Dermatological: negative for rash Respiratory: negative for cough or wheezing Urologic: negative for hematuria Abdominal: negative for nausea, vomiting, diarrhea, bright red blood per rectum, melena, or hematemesis Neurologic: negative for visual changes, syncope, or dizziness All other systems reviewed and are otherwise negative except as noted above.    Blood pressure (!) 198/90, pulse (!) 54.  General appearance: alert and no distress Neck: no adenopathy, no carotid bruit, no JVD, supple, symmetrical, trachea midline and thyroid not enlarged, symmetric, no tenderness/mass/nodules Lungs: clear to auscultation bilaterally Heart: regular rate and rhythm, S1, S2 normal, no murmur, click, rub or gallop Extremities: extremities normal, atraumatic, no cyanosis or edema  EKG sinus bradycardia 54 with Q waves across his precordium  and low limb voltage. I personally reviewed this EKG.  ASSESSMENT AND PLAN:   Hyperlipidemia History of hyperlipidemia not on statin therapy with lipid profile performed 12/04/15 revealed a total cholesterol 129, LDL 57 and HDL of 26.  Essential hypertension, benign History of essential hypertension blood pressure measures 198/90. His blood pressure measured home is in the 130/70 range. He is on amlodipine and metoprolol. Continue current meds at current dosing  Occlusion and stenosis of carotid artery with cerebral infarction History of carotid artery disease with known occlusion of the left internal carotid artery status  post right carotid endarterectomy performed by Dr. Trula Slade 11/27/15 which she is recovered from nicely and healed. Doppler studies performed 01/15/16 revealed a widely patent endarterectomy site. This will be followed on a semiannual basis.  CAD S/P percutaneous coronary angioplasty History of CAD status post anterior STEMI 07/02/13. He presented with epigastric burning/indigestion and left arm pain with a troponin of greater than 20. He underwent cardiac catheterization, aspiration thrombectomy, PCI and stenting of his proximal LAD via the right femoral approach performed by myself appears overall EF was preserved at 50%. He remains asymptomatic on aspirin and Plavix.  Claudication History of peripheral arterial disease status post diamondback orbital rotation atherectomy, PTA and stenting of a highly calcified ostial right common iliac artery stenosis with an excellent into graphic result. His claudication resolved. Does have a small abdominal aortic aneurysm which we are following as well.      Lorretta Harp MD FACP,FACC,FAHA, [facility] 06/02/2016 11:52 AM

## 2016-06-02 NOTE — Assessment & Plan Note (Signed)
History of peripheral arterial disease status post diamondback orbital rotation atherectomy, PTA and stenting of a highly calcified ostial right common iliac artery stenosis with an excellent into graphic result. His claudication resolved. Does have a small abdominal aortic aneurysm which we are following as well.

## 2016-06-08 ENCOUNTER — Telehealth: Payer: Self-pay | Admitting: Surgery

## 2016-06-08 ENCOUNTER — Encounter: Payer: Self-pay | Admitting: Surgery

## 2016-06-08 NOTE — Telephone Encounter (Signed)
Called patient and left message regarding his scheduled appt on 06/22/16 at 4pm with Dr.Brabham. [facility] will be out of the office on that afternoon and I rescheduled his appt to 06/17/16 at 3:45pm. I also mailed an appt letter with new appt date/time/awt

## 2016-06-12 ENCOUNTER — Ambulatory Visit: Payer: PPO | Admitting: Family Medicine

## 2016-06-17 ENCOUNTER — Encounter: Payer: Self-pay | Admitting: Surgery

## 2016-06-17 ENCOUNTER — Ambulatory Visit (INDEPENDENT_AMBULATORY_CARE_PROVIDER_SITE_OTHER): Payer: PPO | Admitting: Surgery

## 2016-06-17 VITALS — BP 177/73 | HR 55 | Temp 97.6°F | Resp 20 | Ht 68.0 in | Wt 193.0 lb

## 2016-06-17 DIAGNOSIS — I6521 Occlusion and stenosis of right carotid artery: Secondary | ICD-10-CM | POA: Diagnosis not present

## 2016-06-17 NOTE — Progress Notes (Signed)
Vascular and Vein Specialist of   Patient name: [name] [name] MRN: [id number] DOB: 09-06-1934 Sex: male   REASON FOR VISIT:    Follow up carotid  HISOTRY OF PRESENT ILLNESS:   [name] [name] is a 82 y.o. male  Returns today for follow-up.  He is status post right carotid endarterectomy with bovine pericardial patch angioplasty on 11/27/2015.  The patient has a history of an occluded left internal carotid artery. He was not a stent candidate because of calcification and access issues.  Intraoperative findings included a very difficult exposure.  The lesion was under the hypoglossal nerve which had to be mobilized.  There was an inflammatory appearance to the plaque which was very adherent.   He has no complaints today.  He said there is a small scab at his incision site.  He is not having any neurologic symptoms.   PAST MEDICAL HISTORY:   Past Medical History:  Diagnosis Date  . Arthritis    "all over" (09/04/2013)  . Bleeds easily ([place])    d/t being on Plavix and ASA per pt  . CAD (coronary artery disease) 2015   a. STEMI 2002 s/p stent to PDA. b. Anterior STEMI 06/2013 s/p  asp thrombectomy, DES to prox LAD, EF preserved.  . Carotid artery disease ([place]) 10/2013    Total occlusion of the LICA, 28-31^ stenosis of the RICA  . Enlarged prostate   . GERD (gastroesophageal reflux disease)    takes Pantoprazole daily  . Hard of hearing    no hearing aides  . History of colon polyps    benign  . History of diverticulitis   . History of shingles   . Hyperlipidemia    takes Pravastatin daily  . Hypertension    takes Amlodipine and Metoprolol daily  . Joint pain   . Joint swelling   . Myocardial infarction at age 25 and other 07/05/11  . Nocturia   . PVD (peripheral vascular disease) with claudication ([place]) 2015   a. 08/2013: s/p diamondback orbital rotational atherectomy and 8 mm x 30 mm long ICast covered stent to calcified ostial  right common iliac artery. b. 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  . Stroke ([place]) ~ 2012    x 2:right side is weaker  . Thin skin   . Thrombocytopenia ([place])   . Urinary frequency      FAMILY HISTORY:   Family History  Problem Relation Age of Onset  . Diabetes Mother   . Heart disease Mother   . Hyperlipidemia Mother   . Hypertension Mother   . Heart disease Father   . Hyperlipidemia Father   . Hypertension Father   . Heart attack Father   . Diabetes Sister   . Heart disease Sister   . Hyperlipidemia Sister   . Hypertension Sister   . Diabetes Brother   . Heart disease Brother     before age 73  . Hyperlipidemia Brother   . Hypertension Brother   . Heart attack Brother   . Diabetes Sister   . Heart disease Sister   . Edema Sister   . Cancer Sister   . Sleep apnea Brother   . Diabetes Brother     SOCIAL HISTORY:   Social History  Substance Use Topics  . Smoking status: Never Smoker  . Smokeless tobacco: Never Used  . Alcohol use No     ALLERGIES:   Allergies  Allergen  Reactions  . Brilinta [Ticagrelor] Shortness Of Breath and Other (See Comments)    VERTIGO  . Influenza Vaccines Other (See Comments)    UNSPECIFIED REACTION Pt states he has been hospitalized both times he was given flu vaccine as a younger adult while in the [employer] and they told him not to take it again  . Lipitor [Atorvastatin] Other (See Comments)    MYALGIAS, JOINT PAIN  . Fluoride Preparations      CURRENT MEDICATIONS:   Current Outpatient Prescriptions  Medication Sig Dispense Refill  . acetaminophen (TYLENOL) 500 MG tablet Take 500 mg by mouth every 6 (six) hours as needed for mild pain or moderate pain.     [name] amLODipine (NORVASC) 10 MG tablet Take 1 tablet (10 mg total) by mouth daily. 90 tablet 1  . aspirin EC 81 MG tablet Take 81 mg by mouth daily.    . clopidogrel (PLAVIX) 75 MG tablet Take 1 tablet (75 mg total) by  mouth daily. 90 tablet 1  . methylcellulose (ARTIFICIAL TEARS) 1 % ophthalmic solution Place 1 drop into both eyes as needed (for dry eyes).    . metoprolol (LOPRESSOR) 50 MG tablet Take 1 tablet (50 mg total) by mouth 2 (two) times daily. 180 tablet 1  . nitroGLYCERIN (NITROSTAT) 0.4 MG SL tablet Place 1 tablet (0.4 mg total) under the tongue every 5 (five) minutes as needed for chest pain. 25 tablet 2  . pantoprazole (PROTONIX) 20 MG tablet Take 1 tablet (20 mg total) by mouth 2 (two) times daily. 180 tablet 1  . traMADol (ULTRAM) 50 MG tablet Take 1 tablet (50 mg total) by mouth 3 (three) times daily as needed for moderate pain. 90 tablet 5   No current facility-administered medications for this visit.     REVIEW OF SYSTEMS:   [X]  denotes positive finding, [ ]  denotes negative finding Cardiac  Comments:  Chest pain or chest pressure:    Shortness of breath upon exertion:    Short of breath when lying flat:    Irregular heart rhythm:        Vascular    Pain in calf, thigh, or hip brought on by ambulation:    Pain in feet at night that wakes you up from your sleep:     Blood clot in your veins:    Leg swelling:         Pulmonary    Oxygen at home:    Productive cough:     Wheezing:         Neurologic    Sudden weakness in arms or legs:     Sudden numbness in arms or legs:     Sudden onset of difficulty speaking or slurred speech:    Temporary loss of vision in one eye:     Problems with dizziness:         Gastrointestinal    Blood in stool:     Vomited blood:         Genitourinary    Burning when urinating:     Blood in urine:        Psychiatric    Major depression:         Hematologic    Bleeding problems:    Problems with blood clotting too easily:        Skin    Rashes or ulcers:        Constitutional    Fever or chills:      PHYSICAL EXAM:  There were no vitals filed for this visit.  GENERAL: The patient is a well-nourished male, in no acute  distress. The vital signs are documented above. CARDIAC: There is a regular rate and rhythm.  VASCULAR: A retained suture was found at the superior aspect of his incision.  This was removed. PULMONARY: Non-labored respirations MUSCULOSKELETAL: There are no major deformities or cyanosis. NEUROLOGIC: No focal weakness or paresthesias are detected. SKIN: There are no ulcers or rashes noted. PSYCHIATRIC: The patient has a normal affect.  STUDIES:   I have reviewed his carotid Doppler study on 01/15/2016 which shows a widely patent right carotid endarterectomy site.  The left carotid artery is known to be occluded  MEDICAL ISSUES:   Status post right carotid endarterectomy.  Known left carotid occlusion:The patient is scheduled to have a follow-up ultrasound with [name] on April 10.  From my perspective he is doing for a well.  There were no outstanding issues.  I have him scheduled to follow-up with me on an as-needed basis.  He will get surveillance carotid Doppler studies with Dr. Bing Matter, MD [facility] [phone] Pager [phone]

## 2016-06-22 ENCOUNTER — Ambulatory Visit: Payer: PPO | Admitting: Surgery

## 2016-07-07 ENCOUNTER — Ambulatory Visit (HOSPITAL_COMMUNITY)
Admission: RE | Admit: 2016-07-07 | Discharge: 2016-07-07 | Disposition: A | Discharge status: Home / Self Care | Payer: PPO | Source: Ambulatory Visit | Attending: Cardiovascular Disease | Admitting: Cardiovascular Disease

## 2016-07-07 DIAGNOSIS — I2510 Atherosclerotic heart disease of native coronary artery without angina pectoris: Secondary | ICD-10-CM | POA: Insufficient documentation

## 2016-07-07 DIAGNOSIS — I779 Disorder of arteries and arterioles, unspecified: Secondary | ICD-10-CM | POA: Diagnosis not present

## 2016-07-07 DIAGNOSIS — I739 Peripheral vascular disease, unspecified: Secondary | ICD-10-CM

## 2016-07-07 DIAGNOSIS — I77811 Abdominal aortic ectasia: Secondary | ICD-10-CM | POA: Diagnosis not present

## 2016-07-07 DIAGNOSIS — I708 Atherosclerosis of other arteries: Secondary | ICD-10-CM | POA: Diagnosis not present

## 2016-07-07 DIAGNOSIS — I700 Atherosclerosis of aorta: Secondary | ICD-10-CM | POA: Diagnosis not present

## 2016-07-07 DIAGNOSIS — Z8673 Personal history of transient ischemic attack (TIA), and cerebral infarction without residual deficits: Secondary | ICD-10-CM | POA: Insufficient documentation

## 2016-07-07 DIAGNOSIS — I10 Essential (primary) hypertension: Secondary | ICD-10-CM | POA: Insufficient documentation

## 2016-07-07 DIAGNOSIS — Z95828 Presence of other vascular implants and grafts: Secondary | ICD-10-CM | POA: Insufficient documentation

## 2016-07-07 DIAGNOSIS — E785 Hyperlipidemia, unspecified: Secondary | ICD-10-CM | POA: Insufficient documentation

## 2016-07-07 DIAGNOSIS — I6523 Occlusion and stenosis of bilateral carotid arteries: Secondary | ICD-10-CM | POA: Diagnosis not present

## 2016-07-10 ENCOUNTER — Other Ambulatory Visit: Payer: Self-pay | Admitting: Cardiovascular Disease

## 2016-07-10 DIAGNOSIS — I739 Peripheral vascular disease, unspecified: Secondary | ICD-10-CM

## 2016-07-10 DIAGNOSIS — I6522 Occlusion and stenosis of left carotid artery: Secondary | ICD-10-CM

## 2016-07-27 ENCOUNTER — Ambulatory Visit (INDEPENDENT_AMBULATORY_CARE_PROVIDER_SITE_OTHER): Payer: PPO | Admitting: Family Medicine

## 2016-07-27 ENCOUNTER — Encounter: Payer: Self-pay | Admitting: Family Medicine

## 2016-07-27 VITALS — BP 154/74 | Ht 68.0 in | Wt 193.8 lb

## 2016-07-27 DIAGNOSIS — Z79899 Other long term (current) drug therapy: Secondary | ICD-10-CM

## 2016-07-27 DIAGNOSIS — I10 Essential (primary) hypertension: Secondary | ICD-10-CM | POA: Diagnosis not present

## 2016-07-27 DIAGNOSIS — E784 Other hyperlipidemia: Secondary | ICD-10-CM

## 2016-07-27 DIAGNOSIS — E7849 Other hyperlipidemia: Secondary | ICD-10-CM

## 2016-07-27 MED ORDER — HYDROCHLOROTHIAZIDE 12.5 MG PO CAPS: 12.5000 mg | ORAL_CAPSULE | Freq: Every day | ORAL | 5 refills | Status: DC

## 2016-07-27 MED ORDER — POTASSIUM CHLORIDE CRYS ER 10 MEQ PO TBCR: 10.0000 meq | EXTENDED_RELEASE_TABLET | ORAL | 5 refills | Status: DC

## 2016-07-27 NOTE — Progress Notes (Signed)
   Subjective:    Patient ID: [name] [name], male    DOB: 05-Apr-1934, 81 y.o.   MRN: [id number]  Hypertension  This is a chronic problem. The current episode started more than 1 year ago. Pertinent negatives include no chest pain, headaches or shortness of breath. Treatments tried: metoprolol, norvasc.  BP has been running high. Wife states this morning 205/85.  Other times when he checks his blood pressure seems to be better he denies lightheadedness denies difficulty breathing no headaches or vomiting.  Review of Systems  Constitutional: Negative for activity change, fatigue and fever.  Respiratory: Negative for cough and shortness of breath.   Cardiovascular: Negative for chest pain and leg swelling.  Neurological: Negative for headaches.       Objective:   Physical Exam  Constitutional: He appears well-nourished. No distress.  Cardiovascular: Normal rate, regular rhythm and normal heart sounds.   No murmur heard. Pulmonary/Chest: Effort normal and breath sounds normal. No respiratory distress.  Musculoskeletal: He exhibits no edema.  Lymphadenopathy:    He has no cervical adenopathy.  Neurological: He is alert.  Psychiatric: His behavior is normal.  Vitals reviewed.  His blood pressure is elevated on a recheck. His systolic is elevated this could well be due to essentially atherosclerosis and hardening of the arteries I would recommend adding a diuretic he was warned that if this causes fatigue to let us know. He will do lab work in a few weeks time.       Assessment & Plan:  Elevated blood pressure and low dose HCTZ along with low-dose potassium check lab work in a few weeks follow-up office visit several weeks down the road to recheck call us if ongoing blood pressure issues  Hyperlipidemia check lipid liver profile

## 2016-09-07 DIAGNOSIS — E784 Other hyperlipidemia: Secondary | ICD-10-CM | POA: Diagnosis not present

## 2016-09-07 DIAGNOSIS — Z79899 Other long term (current) drug therapy: Secondary | ICD-10-CM | POA: Diagnosis not present

## 2016-09-07 DIAGNOSIS — I10 Essential (primary) hypertension: Secondary | ICD-10-CM | POA: Diagnosis not present

## 2016-09-08 LAB — BASIC METABOLIC PANEL
BUN/Creatinine Ratio: 15 (ref 10–24)
BUN: 15 mg/dL (ref 8–27)
CALCIUM: 9.2 mg/dL (ref 8.6–10.2)
CO2: 27 mmol/L (ref 20–29)
CREATININE: 0.97 mg/dL (ref 0.76–1.27)
Chloride: 102 mmol/L (ref 96–106)
GFR calc Af Amer: 85 mL/min/{1.73_m2} (ref >59)
GFR, EST NON AFRICAN AMERICAN: 74 mL/min/{1.73_m2} (ref >59)
Glucose: 100 mg/dL — ABNORMAL HIGH (ref 65–99)
POTASSIUM: 5.3 mmol/L — AB (ref 3.5–5.2)
Sodium: 144 mmol/L (ref 134–144)

## 2016-09-08 LAB — LIPID PANEL
Chol/HDL Ratio: 5.3 ratio — ABNORMAL HIGH (ref 0.0–5.0)
Cholesterol, Total: 153 mg/dL (ref 100–199)
HDL: 29 mg/dL — ABNORMAL LOW (ref >39)
LDL CALC: 68 mg/dL (ref 0–99)
Triglycerides: 280 mg/dL — ABNORMAL HIGH (ref 0–149)
VLDL CHOLESTEROL CAL: 56 mg/dL — AB (ref 5–40)

## 2016-09-08 LAB — HEPATIC FUNCTION PANEL
ALBUMIN: 4.4 g/dL (ref 3.5–4.8)
ALT: 12 IU/L (ref 0–44)
AST: 18 IU/L (ref 0–40)
Alkaline Phosphatase: 64 IU/L (ref 39–117)
Bilirubin Total: 0.4 mg/dL (ref 0.0–1.2)
Bilirubin, Direct: 0.11 mg/dL (ref 0.00–0.40)
TOTAL PROTEIN: 6.4 g/dL (ref 6.0–8.5)

## 2016-09-14 ENCOUNTER — Ambulatory Visit (INDEPENDENT_AMBULATORY_CARE_PROVIDER_SITE_OTHER): Payer: PPO | Admitting: Family Medicine

## 2016-09-14 ENCOUNTER — Encounter: Payer: Self-pay | Admitting: Family Medicine

## 2016-09-14 VITALS — BP 148/68 | Ht 68.0 in | Wt 193.0 lb

## 2016-09-14 DIAGNOSIS — I10 Essential (primary) hypertension: Secondary | ICD-10-CM

## 2016-09-14 NOTE — Progress Notes (Signed)
   Subjective:    Patient ID: [name] [name], male    DOB: [birth year], 82 y.o.   MRN: [id number]  Hypertension  This is a chronic problem. Pertinent negatives include no chest pain. There are no compliance problems.   He denies any chest tightness pressure pain shortness breath denies vomiting diarrhea states he takes his medicine denies fever chills sweats at times does have head congestion    Review of Systems  Constitutional: Negative for activity change and fever.  HENT: Positive for congestion and rhinorrhea. Negative for ear pain.   Eyes: Negative for discharge.  Respiratory: Positive for cough. Negative for wheezing.   Cardiovascular: Negative for chest pain.       Objective:   Physical Exam  Constitutional: He appears well-developed.  HENT:  Head: Normocephalic.  Mouth/Throat: Oropharynx is clear and moist. No oropharyngeal exudate.  Neck: Normal range of motion.  Cardiovascular: Normal rate, regular rhythm and normal heart sounds.   No murmur heard. Pulmonary/Chest: Effort normal and breath sounds normal. He has no wheezes.  Lymphadenopathy:    He has no cervical adenopathy.  Neurological: He exhibits normal muscle tone.  Skin: Skin is warm and dry.  Nursing note and vitals reviewed.   Patient is had several falls over the past year when he loses his balance no syncope. He is starting to use a cane      Assessment & Plan:  HTN- Patient was seen today as part of a visit regarding hypertension. The importance of healthy diet and regular physical activity was discussed. The importance of compliance with medications discussed. Ideal goal is to keep blood pressure low elevated levels certainly below 828/00 when possible. The patient was counseled that keeping blood pressure under control lessen his risk of heart attack, stroke, kidney failure, and early death. The importance of regular follow-ups was discussed with the patient. Low-salt diet such as DASH recommended.  Regular physical activity was recommended as well. Patient was advised to keep regular follow-ups.  Blood pressure is mildly elevated but patient has fragile cardiovascular health he states his readings at home sometimes are better. When he stands his blood pressure drops down to 132/70  I do not recommend increasing his medications currently I believe that would put him at risk for further falls  His lab work was reviewed with the patient healthy diet recommended follow-up in 6 months  A copy will be sent to his cardiologist

## 2016-09-18 ENCOUNTER — Other Ambulatory Visit: Payer: Self-pay | Admitting: *Deleted

## 2016-09-18 MED ORDER — PANTOPRAZOLE SODIUM 20 MG PO TBEC: 20.0000 mg | DELAYED_RELEASE_TABLET | Freq: Two times a day (BID) | ORAL | 1 refills | Status: DC

## 2016-09-18 MED ORDER — AMLODIPINE BESYLATE 10 MG PO TABS: 10.0000 mg | ORAL_TABLET | Freq: Every day | ORAL | 1 refills | Status: DC

## 2016-09-22 ENCOUNTER — Other Ambulatory Visit: Payer: Self-pay

## 2016-09-22 MED ORDER — CLOPIDOGREL BISULFATE 75 MG PO TABS: 75.0000 mg | ORAL_TABLET | Freq: Every day | ORAL | 1 refills | Status: DC

## 2016-09-22 MED ORDER — HYDROCHLOROTHIAZIDE 12.5 MG PO CAPS: 12.5000 mg | ORAL_CAPSULE | Freq: Every day | ORAL | 0 refills | Status: DC

## 2016-09-22 MED ORDER — PANTOPRAZOLE SODIUM 20 MG PO TBEC: 20.0000 mg | DELAYED_RELEASE_TABLET | Freq: Two times a day (BID) | ORAL | 1 refills | Status: DC

## 2016-09-22 MED ORDER — METOPROLOL TARTRATE 50 MG PO TABS: 50.0000 mg | ORAL_TABLET | Freq: Two times a day (BID) | ORAL | 1 refills | Status: DC

## 2016-09-22 MED ORDER — AMLODIPINE BESYLATE 10 MG PO TABS: 10.0000 mg | ORAL_TABLET | Freq: Every day | ORAL | 1 refills | Status: DC

## 2017-01-14 ENCOUNTER — Other Ambulatory Visit: Payer: Self-pay | Admitting: Family Medicine

## 2017-01-14 NOTE — Telephone Encounter (Signed)
Patient called requesting rx be called into [facility] for tramadol 50 mg refills

## 2017-01-17 NOTE — Telephone Encounter (Signed)
May have this +3 refills keep follow-up office visit

## 2017-01-18 MED ORDER — TRAMADOL HCL 50 MG PO TABS: 50.0000 mg | ORAL_TABLET | Freq: Three times a day (TID) | ORAL | 3 refills | Status: DC | PRN

## 2017-01-18 NOTE — Telephone Encounter (Signed)
Spoke with patient and informed him per Dr.Scott Luking we are sending refills on tramadol to [facility]. Patient verbalized understanding.

## 2017-02-01 ENCOUNTER — Other Ambulatory Visit: Payer: Self-pay | Admitting: Family Medicine

## 2017-03-05 ENCOUNTER — Telehealth: Payer: Self-pay | Admitting: Family Medicine

## 2017-03-05 DIAGNOSIS — I10 Essential (primary) hypertension: Secondary | ICD-10-CM

## 2017-03-05 DIAGNOSIS — E7849 Other hyperlipidemia: Secondary | ICD-10-CM

## 2017-03-05 DIAGNOSIS — D696 Thrombocytopenia, unspecified: Secondary | ICD-10-CM

## 2017-03-05 NOTE — Telephone Encounter (Signed)
Please put in lab orders: lipid/liver/met 7/cbc- hyperlip/htn/thrombocytopenia

## 2017-03-05 NOTE — Telephone Encounter (Signed)
Blood work ordered in EPIC 

## 2017-03-05 NOTE — Addendum Note (Signed)
Addended by: Dairl Ponder on: 03/05/2017 10:39 AM   Modules accepted: Orders

## 2017-03-11 DIAGNOSIS — D696 Thrombocytopenia, unspecified: Secondary | ICD-10-CM | POA: Diagnosis not present

## 2017-03-11 DIAGNOSIS — E7849 Other hyperlipidemia: Secondary | ICD-10-CM | POA: Diagnosis not present

## 2017-03-11 DIAGNOSIS — I10 Essential (primary) hypertension: Secondary | ICD-10-CM | POA: Diagnosis not present

## 2017-03-12 LAB — CBC WITH DIFFERENTIAL/PLATELET
BASOS ABS: 0 10*3/uL (ref 0.0–0.2)
Basos: 1 %
EOS (ABSOLUTE): 0.2 10*3/uL (ref 0.0–0.4)
Eos: 4 %
HEMOGLOBIN: 17.1 g/dL (ref 13.0–17.7)
Hematocrit: 49.5 % (ref 37.5–51.0)
Immature Grans (Abs): 0 10*3/uL (ref 0.0–0.1)
Immature Granulocytes: 0 %
LYMPHS ABS: 1.5 10*3/uL (ref 0.7–3.1)
Lymphs: 24 %
MCH: 30.1 pg (ref 26.6–33.0)
MCHC: 34.5 g/dL (ref 31.5–35.7)
MCV: 87 fL (ref 79–97)
MONOS ABS: 0.5 10*3/uL (ref 0.1–0.9)
Monocytes: 8 %
Neutrophils Absolute: 4 10*3/uL (ref 1.4–7.0)
Neutrophils: 63 %
Platelets: 101 10*3/uL — ABNORMAL LOW (ref 150–379)
RBC: 5.69 x10E6/uL (ref 4.14–5.80)
RDW: 15.2 % (ref 12.3–15.4)
WBC: 6.3 10*3/uL (ref 3.4–10.8)

## 2017-03-12 LAB — LIPID PANEL
Chol/HDL Ratio: 4.7 ratio (ref 0.0–5.0)
Cholesterol, Total: 141 mg/dL (ref 100–199)
HDL: 30 mg/dL — ABNORMAL LOW (ref >39)
LDL Calculated: 72 mg/dL (ref 0–99)
Triglycerides: 195 mg/dL — ABNORMAL HIGH (ref 0–149)
VLDL Cholesterol Cal: 39 mg/dL (ref 5–40)

## 2017-03-12 LAB — HEPATIC FUNCTION PANEL
ALT: 9 IU/L (ref 0–44)
AST: 14 IU/L (ref 0–40)
Albumin: 4.3 g/dL (ref 3.5–4.7)
Alkaline Phosphatase: 71 IU/L (ref 39–117)
Bilirubin Total: 0.5 mg/dL (ref 0.0–1.2)
Bilirubin, Direct: 0.11 mg/dL (ref 0.00–0.40)
Total Protein: 6.6 g/dL (ref 6.0–8.5)

## 2017-03-12 LAB — BASIC METABOLIC PANEL
BUN/Creatinine Ratio: 12 (ref 10–24)
BUN: 12 mg/dL (ref 8–27)
CO2: 27 mmol/L (ref 20–29)
Calcium: 8.9 mg/dL (ref 8.6–10.2)
Chloride: 102 mmol/L (ref 96–106)
Creatinine, Ser: 0.97 mg/dL (ref 0.76–1.27)
GFR calc Af Amer: 85 mL/min/{1.73_m2} (ref >59)
GFR calc non Af Amer: 73 mL/min/{1.73_m2} (ref >59)
Glucose: 129 mg/dL — ABNORMAL HIGH (ref 65–99)
Potassium: 4.8 mmol/L (ref 3.5–5.2)
Sodium: 143 mmol/L (ref 134–144)

## 2017-03-16 ENCOUNTER — Ambulatory Visit: Payer: PPO | Admitting: Family Medicine

## 2017-03-16 ENCOUNTER — Encounter: Payer: Self-pay | Admitting: Family Medicine

## 2017-03-16 VITALS — BP 168/86 | Ht 68.0 in | Wt 196.0 lb

## 2017-03-16 DIAGNOSIS — E7849 Other hyperlipidemia: Secondary | ICD-10-CM | POA: Diagnosis not present

## 2017-03-16 DIAGNOSIS — D696 Thrombocytopenia, unspecified: Secondary | ICD-10-CM | POA: Diagnosis not present

## 2017-03-16 DIAGNOSIS — I10 Essential (primary) hypertension: Secondary | ICD-10-CM | POA: Diagnosis not present

## 2017-03-16 DIAGNOSIS — R7301 Impaired fasting glucose: Secondary | ICD-10-CM | POA: Diagnosis not present

## 2017-03-16 LAB — POCT GLYCOSYLATED HEMOGLOBIN (HGB A1C): Hemoglobin A1C: 4.8

## 2017-03-16 NOTE — Patient Instructions (Signed)
DASH Eating Plan DASH stands for "Dietary Approaches to Stop Hypertension." The DASH eating plan is a healthy eating plan that has been shown to reduce high blood pressure (hypertension). It may also reduce your risk for type 2 diabetes, heart disease, and stroke. The DASH eating plan may also help with weight loss. What are tips for following this plan? General guidelines  Avoid eating more than 2,300 mg (milligrams) of salt (sodium) a day. If you have hypertension, you may need to reduce your sodium intake to 1,500 mg a day.  Limit alcohol intake to no more than 1 drink a day for nonpregnant women and 2 drinks a day for men. One drink equals 12 oz of beer, 5 oz of wine, or 1 oz of hard liquor.  Work with your health care provider to maintain a healthy body weight or to lose weight. Ask what an ideal weight is for you.  Get at least 30 minutes of exercise that causes your heart to beat faster (aerobic exercise) most days of the week. Activities may include walking, swimming, or biking.  Work with your health care provider or diet and nutrition specialist (dietitian) to adjust your eating plan to your individual calorie needs. Reading food labels  Check food labels for the amount of sodium per serving. Choose foods with less than 5 percent of the Daily Value of sodium. Generally, foods with less than 300 mg of sodium per serving fit into this eating plan.  To find whole grains, look for the word "whole" as the first word in the ingredient list. Shopping  Buy products labeled as "low-sodium" or "no salt added."  Buy fresh foods. Avoid canned foods and premade or frozen meals. Cooking  Avoid adding salt when cooking. Use salt-free seasonings or herbs instead of table salt or sea salt. Check with your health care provider or pharmacist before using salt substitutes.  Do not fry foods. Cook foods using healthy methods such as baking, boiling, grilling, and broiling instead.  Cook with  heart-healthy oils, such as olive, canola, soybean, or sunflower oil. Meal planning   Eat a balanced diet that includes: ? 5 or more servings of fruits and vegetables each day. At each meal, try to fill half of your plate with fruits and vegetables. ? Up to 6-8 servings of whole grains each day. ? Less than 6 oz of lean meat, poultry, or fish each day. A 3-oz serving of meat is about the same size as a deck of cards. One egg equals 1 oz. ? 2 servings of low-fat dairy each day. ? A serving of nuts, seeds, or beans 5 times each week. ? Heart-healthy fats. Healthy fats called Omega-3 fatty acids are found in foods such as flaxseeds and coldwater fish, like sardines, salmon, and mackerel.  Limit how much you eat of the following: ? Canned or prepackaged foods. ? Food that is high in trans fat, such as fried foods. ? Food that is high in saturated fat, such as fatty meat. ? Sweets, desserts, sugary drinks, and other foods with added sugar. ? Full-fat dairy products.  Do not salt foods before eating.  Try to eat at least 2 vegetarian meals each week.  Eat more home-cooked food and less restaurant, buffet, and fast food.  When eating at a restaurant, ask that your food be prepared with less salt or no salt, if possible. What foods are recommended? The items listed may not be a complete list. Talk with your dietitian about what   dietary choices are best for you. Grains Whole-grain or whole-wheat bread. Whole-grain or whole-wheat pasta. Brown rice. Oatmeal. Quinoa. Bulgur. Whole-grain and low-sodium cereals. Pita bread. Low-fat, low-sodium crackers. Whole-wheat flour tortillas. Vegetables Fresh or frozen vegetables (raw, steamed, roasted, or grilled). Low-sodium or reduced-sodium tomato and vegetable juice. Low-sodium or reduced-sodium tomato sauce and tomato paste. Low-sodium or reduced-sodium canned vegetables. Fruits All fresh, dried, or frozen fruit. Canned fruit in natural juice (without  added sugar). Meat and other protein foods Skinless chicken or turkey. Ground chicken or turkey. Pork with fat trimmed off. Fish and seafood. Egg whites. Dried beans, peas, or lentils. Unsalted nuts, nut butters, and seeds. Unsalted canned beans. Lean cuts of beef with fat trimmed off. Low-sodium, lean deli meat. Dairy Low-fat (1%) or fat-free (skim) milk. Fat-free, low-fat, or reduced-fat cheeses. Nonfat, low-sodium ricotta or cottage cheese. Low-fat or nonfat yogurt. Low-fat, low-sodium cheese. Fats and oils Soft margarine without trans fats. Vegetable oil. Low-fat, reduced-fat, or light mayonnaise and salad dressings (reduced-sodium). Canola, safflower, olive, soybean, and sunflower oils. Avocado. Seasoning and other foods Herbs. Spices. Seasoning mixes without salt. Unsalted popcorn and pretzels. Fat-free sweets. What foods are not recommended? The items listed may not be a complete list. Talk with your dietitian about what dietary choices are best for you. Grains Baked goods made with fat, such as croissants, muffins, or some breads. Dry pasta or rice meal packs. Vegetables Creamed or fried vegetables. Vegetables in a cheese sauce. Regular canned vegetables (not low-sodium or reduced-sodium). Regular canned tomato sauce and paste (not low-sodium or reduced-sodium). Regular tomato and vegetable juice (not low-sodium or reduced-sodium). Pickles. Olives. Fruits Canned fruit in a light or heavy syrup. Fried fruit. Fruit in cream or butter sauce. Meat and other protein foods Fatty cuts of meat. Ribs. Fried meat. Bacon. Sausage. Bologna and other processed lunch meats. Salami. Fatback. Hotdogs. Bratwurst. Salted nuts and seeds. Canned beans with added salt. Canned or smoked fish. Whole eggs or egg yolks. Chicken or turkey with skin. Dairy Whole or 2% milk, cream, and half-and-half. Whole or full-fat cream cheese. Whole-fat or sweetened yogurt. Full-fat cheese. Nondairy creamers. Whipped toppings.  Processed cheese and cheese spreads. Fats and oils Butter. Stick margarine. Lard. Shortening. Ghee. Bacon fat. Tropical oils, such as coconut, palm kernel, or palm oil. Seasoning and other foods Salted popcorn and pretzels. Onion salt, garlic salt, seasoned salt, table salt, and sea salt. Worcestershire sauce. Tartar sauce. Barbecue sauce. Teriyaki sauce. Soy sauce, including reduced-sodium. Steak sauce. Canned and packaged gravies. Fish sauce. Oyster sauce. Cocktail sauce. Horseradish that you find on the shelf. Ketchup. Mustard. Meat flavorings and tenderizers. Bouillon cubes. Hot sauce and Tabasco sauce. Premade or packaged marinades. Premade or packaged taco seasonings. Relishes. Regular salad dressings. Where to find more information:  National Heart, Lung, and Blood Institute: www.nhlbi.nih.gov  American Heart Association: www.heart.org Summary  The DASH eating plan is a healthy eating plan that has been shown to reduce high blood pressure (hypertension). It may also reduce your risk for type 2 diabetes, heart disease, and stroke.  With the DASH eating plan, you should limit salt (sodium) intake to 2,300 mg a day. If you have hypertension, you may need to reduce your sodium intake to 1,500 mg a day.  When on the DASH eating plan, aim to eat more fresh fruits and vegetables, whole grains, lean proteins, low-fat dairy, and heart-healthy fats.  Work with your health care provider or diet and nutrition specialist (dietitian) to adjust your eating plan to your individual   calorie needs. This information is not intended to replace advice given to you by your health care provider. Make sure you discuss any questions you have with your health care provider. Document Released: 03/05/2011 Document Revised: 03/09/2016 Document Reviewed: 03/09/2016 Elsevier Interactive Patient Education  2017 Elsevier Inc.  

## 2017-03-16 NOTE — Progress Notes (Signed)
   Subjective:    Patient ID: [name] [name], male    DOB: 1934/08/03, 82 y.o.   MRN: [id number]  HPI Patient is here today for a follow up on HTN. He is taking Metoprolol 50 mg one BID,HCTZ 12.5 one daily, Norvasc 10 mg one daily. He eats what he wants,and he does exercise some. Patient relates he takes his medicine checks his blood pressure usually is 160/80 he states in the past when he tried to go up on medication it caused him to feel worse he does not want to go up on medicine he does have a history of stroke we did discuss this he also has a history of peripheral vascular disease Lab work was completed recently each part of this was reviewed with him in detail Review of Systems  Constitutional: Negative for activity change, appetite change and fatigue.  HENT: Negative for congestion.   Respiratory: Negative for cough.   Cardiovascular: Negative for chest pain.  Gastrointestinal: Negative for abdominal pain.  Endocrine: Negative for polydipsia and polyphagia.  Neurological: Negative for weakness.  Psychiatric/Behavioral: Negative for confusion.       Objective:   Physical Exam  Constitutional: He appears well-nourished. No distress.  Cardiovascular: Normal rate, regular rhythm and normal heart sounds.  No murmur heard. Pulmonary/Chest: Effort normal and breath sounds normal. No respiratory distress.  Musculoskeletal: He exhibits no edema.  Lymphadenopathy:    He has no cervical adenopathy.  Neurological: He is alert.  Psychiatric: His behavior is normal.  Vitals reviewed.    25 minutes was spent with the patient. Greater than half the time was spent in discussion and answering questions and counseling regarding the issues that the patient came in for today.      Assessment & Plan:  Arthritic pain and discomfort-I recommend that he use Tylenol and tramadol and stay away from Advil Aleve  Low platelets not in a dangerous range may continue 81 mg aspirin and  Plavix  HTN-I believe his blood pressure is higher than what it should be puts him at risk for stroke he does not want to go up on medication I encouraged him low-salt diet and to stay active follow-up within 6 months  Glucose on his blood work elevated check A1c await results. Results for orders placed or performed in visit on 03/16/17  POCT glycosylated hemoglobin (Hb A1C)  Result Value Ref Range   Hemoglobin A1C 4.8    Fasting hyperglycemia but A1c looks good no need for any type of intervention at this time other than to watch his diet

## 2017-04-26 ENCOUNTER — Telehealth: Payer: Self-pay | Admitting: Family Medicine

## 2017-04-26 NOTE — Telephone Encounter (Signed)
This form was completed thank you 

## 2017-04-26 NOTE — Telephone Encounter (Signed)
Pt dropped off a handicap placard form to be filled out. Pt is requesting a permanent disability parking placard. Form is in nurse box.

## 2017-05-04 ENCOUNTER — Encounter: Payer: Self-pay | Admitting: Family Medicine

## 2017-05-04 ENCOUNTER — Ambulatory Visit (INDEPENDENT_AMBULATORY_CARE_PROVIDER_SITE_OTHER): Payer: PPO | Admitting: Family Medicine

## 2017-05-04 VITALS — BP 132/86 | Temp 97.7°F | Ht 68.0 in | Wt 186.6 lb

## 2017-05-04 DIAGNOSIS — J111 Influenza due to unidentified influenza virus with other respiratory manifestations: Secondary | ICD-10-CM

## 2017-05-04 DIAGNOSIS — R062 Wheezing: Secondary | ICD-10-CM | POA: Diagnosis not present

## 2017-05-04 DIAGNOSIS — R05 Cough: Secondary | ICD-10-CM

## 2017-05-04 DIAGNOSIS — H6121 Impacted cerumen, right ear: Secondary | ICD-10-CM | POA: Diagnosis not present

## 2017-05-04 DIAGNOSIS — J189 Pneumonia, unspecified organism: Secondary | ICD-10-CM

## 2017-05-04 DIAGNOSIS — J181 Lobar pneumonia, unspecified organism: Secondary | ICD-10-CM | POA: Diagnosis not present

## 2017-05-04 MED ORDER — AZITHROMYCIN 250 MG PO TABS: ORAL_TABLET | ORAL | 0 refills | Status: DC

## 2017-05-04 MED ORDER — ALBUTEROL SULFATE (2.5 MG/3ML) 0.083% IN NEBU
2.5000 mg | INHALATION_SOLUTION | Freq: Once | RESPIRATORY_TRACT | Status: AC
  Administered 2017-05-04: 2.5 mg via RESPIRATORY_TRACT

## 2017-05-04 MED ORDER — CEFTRIAXONE SODIUM 500 MG IJ SOLR
500.0000 mg | Freq: Once | INTRAMUSCULAR | Status: AC
  Administered 2017-05-04: 500 mg via INTRAMUSCULAR

## 2017-05-04 MED ORDER — ALBUTEROL SULFATE HFA 108 (90 BASE) MCG/ACT IN AERS: 2.0000 | INHALATION_SPRAY | Freq: Four times a day (QID) | RESPIRATORY_TRACT | 2 refills | Status: DC | PRN

## 2017-05-04 NOTE — Patient Instructions (Signed)
[name] [name],  I believe that you have the flu and a early case of pneumonia in the right lower lung.  Today the nurse gave you a shot of Rocephin which is a strong antibiotic.  I also sent in Zithromax as well as a albuterol inhaler to the [facility].  This should help get you better.  There is a possibility this could get worse.  If you are starting to have a difficult time breathing or feel you are getting severely worse I recommend that you go to the emergency department or urgent care.  Otherwise I would like to recheck you in 48 hours on Thursday here at the office.  Please call if any other concerns  Dr. Nicki Reaper    Influenza, Adult Influenza ("the flu") is an infection in the lungs, nose, and throat (respiratory tract). It is caused by a virus. The flu causes many common cold symptoms, as well as a high fever and body aches. It can make you feel very sick. The flu spreads easily from person to person (is contagious). Getting a flu shot (influenza vaccination) every year is the best way to prevent the flu. Follow these instructions at home:  Take over-the-counter and prescription medicines only as told by your doctor.  Use a cool mist humidifier to add moisture (humidity) to the air in your home. This can make it easier to breathe.  Rest as needed.  Drink enough fluid to keep your pee (urine) clear or pale yellow.  Cover your mouth and nose when you cough or sneeze.  Wash your hands with soap and water often, especially after you cough or sneeze. If you cannot use soap and water, use hand sanitizer.  Stay home from work or school as told by your doctor. Unless you are visiting your doctor, try to avoid leaving home until your fever has been gone for 24 hours without the use of medicine.  Keep all follow-up visits as told by your doctor. This is important. How is this prevented?  Getting a yearly (annual) flu shot is the best way to avoid getting the flu. You may get the flu shot  in late summer, fall, or winter. Ask your doctor when you should get your flu shot.  Wash your hands often or use hand sanitizer often.  Avoid contact with people who are sick during cold and flu season.  Eat healthy foods.  Drink plenty of fluids.  Get enough sleep.  Exercise regularly. Contact a doctor if:  You get new symptoms.  You have: ? Chest pain. ? Watery poop (diarrhea). ? A fever.  Your cough gets worse.  You start to have more mucus.  You feel sick to your stomach (nauseous).  You throw up (vomit). Get help right away if:  You start to be short of breath or have trouble breathing.  Your skin or nails turn a bluish color.  You have very bad pain or stiffness in your neck.  You get a sudden headache.  You get sudden pain in your face or ear.  You cannot stop throwing up. This information is not intended to replace advice given to you by your health care provider. Make sure you discuss any questions you have with your health care provider. Document Released: 12/24/2007 Document Revised: 08/22/2015 Document Reviewed: 01/08/2015 Elsevier Interactive Patient Education  2017 [employer].

## 2017-05-04 NOTE — Progress Notes (Signed)
   Subjective:    Patient ID: [name] [name], male    DOB: 11/28/1934, 83 y.o.   MRN: [id number]  Cough  The current episode started in the past 7 days. The cough is productive of sputum. Associated symptoms include ear congestion, headaches, rhinorrhea and wheezing. Pertinent negatives include no chest pain, chills, ear pain or fever. Associated symptoms comments: Runny nose.   Patient has had flulike illness for several days with headache body aches fever chills sweats now starting to get chest congestion coughing denies vomiting diarrhea does state that he is wheezing some but denies feeling short of breath PMH she has a history of heart disease and strokes high blood pressure   Review of Systems  Constitutional: Negative for activity change, chills and fever.  HENT: Positive for congestion and rhinorrhea. Negative for ear pain.   Eyes: Negative for discharge.  Respiratory: Positive for cough and wheezing.   Cardiovascular: Negative for chest pain.  Gastrointestinal: Negative for nausea and vomiting.  Musculoskeletal: Negative for arthralgias.  Neurological: Positive for headaches.       Objective:   Physical Exam  Constitutional: He appears well-developed.  HENT:  Head: Normocephalic and atraumatic.  Mouth/Throat: Oropharynx is clear and moist. No oropharyngeal exudate.  Eyes: Right eye exhibits no discharge. Left eye exhibits no discharge.  Neck: Normal range of motion.  Cardiovascular: Normal rate, regular rhythm and normal heart sounds.  No murmur heard. Pulmonary/Chest: Effort normal and breath sounds normal. No respiratory distress. He has no wheezes. He has no rales.  Lymphadenopathy:    He has no cervical adenopathy.  Neurological: He exhibits normal muscle tone.  Skin: Skin is warm and dry.  Nursing note and vitals reviewed.  Patient was evaluated he was given a nebulizer treatment then he was rechecked again plus he was also talked to regarding flu pneumonia  warning signs to watch for I did not feel the patient needed x-rays or lab work because it would not change our path or plan 25 minutes was spent with the patient.  This statement verifies that 25 minutes was indeed spent with the patient. Greater than half the time was spent in discussion, counseling and answering questions  regarding the issues that the patient came in for today as reflected in the diagnosis (s) please refer to documentation for further details.  Patient with some cerumen impaction in the right ear      Assessment & Plan:  Influenza-the patient was diagnosed with influenza. Patient/family educated about the flu and warning signs to watch for. If difficulty breathing, severe neck pain and stiffness, cyanosis, disorientation, or progressive worsening then immediately get rechecked at that ER. If progressive symptoms be certain to be rechecked. Supportive measures such as Tylenol/ibuprofen was discussed. No aspirin use in children. And influenza home care instruction sheet was given. Patient is outside the window where Tamiflu would help  Early right lower lobe pneumonia Reactive airway Neb treatment given with adequate response Rocephin 500 mg Z-Pak  Cerumen impaction referral to ENT

## 2017-05-06 ENCOUNTER — Encounter: Payer: Self-pay | Admitting: Family Medicine

## 2017-05-06 ENCOUNTER — Ambulatory Visit (INDEPENDENT_AMBULATORY_CARE_PROVIDER_SITE_OTHER): Payer: PPO | Admitting: Family Medicine

## 2017-05-06 VITALS — BP 128/84 | Temp 98.2°F | Ht 68.0 in | Wt 189.0 lb

## 2017-05-06 DIAGNOSIS — J189 Pneumonia, unspecified organism: Secondary | ICD-10-CM

## 2017-05-06 DIAGNOSIS — J181 Lobar pneumonia, unspecified organism: Secondary | ICD-10-CM | POA: Diagnosis not present

## 2017-05-06 NOTE — Progress Notes (Signed)
   Subjective:    Patient ID: [name] [name], male    DOB: 10-Jul-1934, 83 y.o.   MRN: [id number]  Pneumonia  He complains of cough. There is no wheezing. Chronicity: follow up. Pertinent negatives include no chest pain, ear pain, fever or rhinorrhea. He reports moderate improvement on treatment.  Patient for recheck of pneumonia taken antibiotics feels better had a shot the other day denies any wheezing or difficulty breathing    Review of Systems  Constitutional: Positive for fatigue. Negative for activity change, chills and fever.  HENT: Negative for congestion, ear pain and rhinorrhea.   Eyes: Negative for discharge.  Respiratory: Positive for cough. Negative for wheezing.   Cardiovascular: Negative for chest pain.  Gastrointestinal: Negative for nausea and vomiting.  Musculoskeletal: Negative for arthralgias.       Objective:   Physical Exam  Constitutional: He appears well-developed.  HENT:  Head: Normocephalic and atraumatic.  Mouth/Throat: Oropharynx is clear and moist. No oropharyngeal exudate.  Eyes: Right eye exhibits no discharge. Left eye exhibits no discharge.  Neck: Normal range of motion.  Cardiovascular: Normal rate, regular rhythm and normal heart sounds.  No murmur heard. Pulmonary/Chest: Effort normal and breath sounds normal. No respiratory distress. He has no wheezes. He has no rales.  Lymphadenopathy:    He has no cervical adenopathy.  Neurological: He exhibits normal muscle tone.  Skin: Skin is warm and dry.  Nursing note and vitals reviewed.         Assessment & Plan:  Pneumonia resolving no need for further intervention should gradually get better

## 2017-05-12 ENCOUNTER — Telehealth: Payer: Self-pay | Admitting: Family Medicine

## 2017-05-12 ENCOUNTER — Encounter: Payer: Self-pay | Admitting: Family Medicine

## 2017-05-12 DIAGNOSIS — H6123 Impacted cerumen, bilateral: Secondary | ICD-10-CM | POA: Diagnosis not present

## 2017-05-12 NOTE — Telephone Encounter (Signed)
error 

## 2017-05-25 ENCOUNTER — Other Ambulatory Visit: Payer: Self-pay | Admitting: Family Medicine

## 2017-05-26 ENCOUNTER — Other Ambulatory Visit: Payer: Self-pay | Admitting: Family Medicine

## 2017-05-28 ENCOUNTER — Telehealth: Payer: Self-pay | Admitting: Family Medicine

## 2017-05-28 MED ORDER — AMLODIPINE BESYLATE 10 MG PO TABS: 10.0000 mg | ORAL_TABLET | Freq: Every day | ORAL | 1 refills | Status: DC

## 2017-05-28 MED ORDER — CLOPIDOGREL BISULFATE 75 MG PO TABS: 75.0000 mg | ORAL_TABLET | Freq: Every day | ORAL | 1 refills | Status: DC

## 2017-05-28 MED ORDER — METOPROLOL TARTRATE 50 MG PO TABS: 50.0000 mg | ORAL_TABLET | Freq: Two times a day (BID) | ORAL | 5 refills | Status: DC

## 2017-05-28 NOTE — Telephone Encounter (Signed)
Pt is needing a 30 day supply sent in pt is completely out of medications. amLODipine (NORVASC) 10 MG tablet  clopidogrel (PLAVIX) 75 MG tablet  metoprolol tartrate (LOPRESSOR) 50 MG tablet  [facility]

## 2017-05-28 NOTE — Telephone Encounter (Signed)
Rs sent. Patient is aware.

## 2017-06-02 ENCOUNTER — Encounter: Payer: Self-pay | Admitting: Cardiovascular Disease

## 2017-06-02 ENCOUNTER — Ambulatory Visit: Payer: PPO | Admitting: Cardiovascular Disease

## 2017-06-02 VITALS — BP 144/86 | HR 58 | Ht 68.0 in | Wt 193.0 lb

## 2017-06-02 DIAGNOSIS — I2102 ST elevation (STEMI) myocardial infarction involving left anterior descending coronary artery: Secondary | ICD-10-CM

## 2017-06-02 DIAGNOSIS — I739 Peripheral vascular disease, unspecified: Secondary | ICD-10-CM

## 2017-06-02 DIAGNOSIS — I6522 Occlusion and stenosis of left carotid artery: Secondary | ICD-10-CM

## 2017-06-02 DIAGNOSIS — I714 Abdominal aortic aneurysm, without rupture, unspecified: Secondary | ICD-10-CM

## 2017-06-02 DIAGNOSIS — I10 Essential (primary) hypertension: Secondary | ICD-10-CM | POA: Diagnosis not present

## 2017-06-02 NOTE — Assessment & Plan Note (Signed)
History of essential hypertension blood pressure measured 144/86. He is on amlodipine and metoprolol. Continue current meds at current dosing.

## 2017-06-02 NOTE — Progress Notes (Signed)
06/02/2017 [facility]   [birth year]  [id number]  Primary Physician Kathyrn Drown, MD Primary Cardiologist: Lorretta Harp MD [name], [place], [name]  HPI:  [name] [name] is a 83 y.o.   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 I last saw him 06/02/16.[name] He was admitted on 07/02/13 for Anterior STEMI. Presenting symptoms included severe epigastric burning pain/ indigestion and left arm pain. Initial troponin was >20.0. He underwent emergent LHC and successful aspiration thrombectomy, PCI and stenting of proximal LAD by [name], via the right femoral artery . The overall LVEF was estimated at 50%, without wall motion abnormalities. He tolerated the procedure well and left the cath lab in stable condition. DAPT with ASA and Brilinta was initiated. He was also placed on a BB, ACE-I and statin. His post PCI course was w/o complication. He was discharged home on 07/05/13.  It should also be noted that at time of emergent LHC, he was found to also have significant peripheral vascular disease with an occluded left iliac and high-grade right common iliac artery stenosis, as well as a small AAA. The patient complained of bilateral LEE. [name] ordered for him to undergo bilateral LEAs. The study was performed in our office on 07/18/13. Findings are as follows: ABI 0.95 on Rt, 0.59 on Lt.the patient underwent angiography, diamondback orbital rotational arthrectomy, PTA and stenting of a highly calcified ostial right common iliac artery stenosis. He had excellent angiographic and clinical result. His Dopplers normalized to no longer has right lower extremity claudication. He wishes to proceed with attempt at percutaneous opacification of his left common iliac chronic total occlusion. He is not tolerating his Brilenta because of shortness of breath and we subsequently transitioned to Plavix. He recently had a left  brain stroke thought to be related to a watershed infarct from a hemodynamically significant right carotid stenosis in the setting of an occluded left carotid. He was evaluated by Dr. Erlinda Hong from [facility]. Dr. Erlinda Hong thought he would be a candidate for carotid stenting should he be found have a right internal carotid artery stenosis of greater than 70%.I intubated him on 05/15/14 revealing at most a 40-50% smooth right internal artery stenosis. I suspect his carotid ultrasound overestimated the degree of stenosis because the contralateral occlusion.  Since I saw him a year ago he's remained stable. He denies chest pain, shortness of breath or claudication. He does want to have a left total knee replacement. A pharmacologic Myoview stress test was intermediate risk with scar and peri-infarct ischemia. His EF is in the 45-55% range. Because of progressively worsening Doppler studies a CT scan scan was performed that showed progression of his right internal carotid artery stenosis compared to his prior CTA. He did have some right common carotid origin stenosis as well. He was neurologically asymptomatic. I did angiogram him approximately 18 months ago revealing at most 50-60% right ICA stenosis. I suspected that the Dopplers overestimated the degree of stenosis because of contralateral occlusion. Because of progression of disease he underwent angiography by myself 10/29/15 with the intent to perform right internal carotid artery stenting. Unfortunately, because of the degree of calcification and tortuosity and was never able to safely access the right common carotid artery and therefore aborted the procedure. The patient also underwent uncomplicated right carotid endarterectomy by Dr. Trula Slade 11/27/15 which he has nicely recovered from. Recent carotid Dopplers performed  01/15/16 revealed a widely patent right carotid endarterectomy site.  Since I saw me year ago he's remained asymptomatic. Denies chest  pain, shortness of breath or claudication. His Dopplers performed year ago revealed widely patent right carotid endarterectomy site as well as patent iliac stents.     Current Meds  Medication Sig  . acetaminophen (TYLENOL) 500 MG tablet Take 500 mg by mouth every 6 (six) hours as needed for mild pain or moderate pain.   [name] albuterol (PROVENTIL HFA;VENTOLIN HFA) 108 (90 Base) MCG/ACT inhaler Inhale 2 puffs into the lungs every 6 (six) hours as needed for wheezing.  [name] amLODipine (NORVASC) 10 MG tablet Take 1 tablet (10 mg total) by mouth daily.  [name] aspirin EC 81 MG tablet Take 81 mg by mouth daily.  [name] azithromycin (ZITHROMAX Z-PAK) 250 MG tablet Take 2 tablets (500 mg) on  Day 1,  followed by 1 tablet (250 mg) once daily on Days 2 through 5.  . clopidogrel (PLAVIX) 75 MG tablet Take 1 tablet (75 mg total) by mouth daily.  . methylcellulose (ARTIFICIAL TEARS) 1 % ophthalmic solution Place 1 drop into both eyes as needed (for dry eyes).  . metoprolol tartrate (LOPRESSOR) 50 MG tablet Take 1 tablet (50 mg total) by mouth 2 (two) times daily.  . nitroGLYCERIN (NITROSTAT) 0.4 MG SL tablet Place 1 tablet (0.4 mg total) under the tongue every 5 (five) minutes as needed for chest pain.  . pantoprazole (PROTONIX) 20 MG tablet Take 1 tablet (20 mg total) by mouth 2 (two) times daily.  . pantoprazole (PROTONIX) 20 MG tablet Take 1 tablet by mouth twice a day  . traMADol (ULTRAM) 50 MG tablet Take 1 tablet (50 mg total) by mouth 3 (three) times daily as needed for moderate pain.  . [DISCONTINUED] hydrochlorothiazide (MICROZIDE) 12.5 MG capsule Take 1 capsule by mouth daily     Allergies  Allergen Reactions  . Brilinta [Ticagrelor] Shortness Of Breath and Other (See Comments)    VERTIGO  . Influenza Vaccines Other (See Comments)    UNSPECIFIED REACTION Pt states he has been hospitalized both times he was given flu vaccine as a younger adult while in the [employer] and they told him not to take it again  .  Lipitor [Atorvastatin] Other (See Comments)    MYALGIAS, JOINT PAIN  . Fluoride Preparations     Social History   Socioeconomic History  . Marital status: Married    Spouse name: Not on file  . Number of children: 3  . Years of education: ASSOCIATES  . Highest education level: Not on file  Social Needs  . Financial resource strain: Not on file  . Food insecurity - worry: Not on file  . Food insecurity - inability: Not on file  . Transportation needs - medical: Not on file  . Transportation needs - non-medical: Not on file  Occupational History  . Occupation: Retired  Tobacco Use  . Smoking status: Never Smoker  . Smokeless tobacco: Never Used  Substance and Sexual Activity  . Alcohol use: No    Alcohol/week: 0.0 oz  . Drug use: No  . Sexual activity: Not Currently  Other Topics Concern  . Not on file  Social History Narrative   Patient is married with 3 children.   Patient is right handed.   Patient has his Associates degree.   Patient drinks 2-3 cups daily.     Review of Systems: General: negative for chills, fever, night sweats or weight changes.  Cardiovascular: negative for chest pain, dyspnea on exertion, edema, orthopnea, palpitations, paroxysmal nocturnal dyspnea or shortness of breath Dermatological: negative for rash Respiratory: negative for cough or wheezing Urologic: negative for hematuria Abdominal: negative for nausea, vomiting, diarrhea, bright red blood per rectum, melena, or hematemesis Neurologic: negative for visual changes, syncope, or dizziness All other systems reviewed and are otherwise negative except as noted above.    Blood pressure (!) 144/86, pulse (!) 58, height 5\' 8"  (1.727 m), weight 193 lb (87.5 kg).  General appearance: alert and no distress Neck: no adenopathy, no carotid bruit, no JVD, supple, symmetrical, trachea midline and thyroid not enlarged, symmetric, no tenderness/mass/nodules Lungs: clear to auscultation  bilaterally Heart: regular rate and rhythm, S1, S2 normal, no murmur, click, rub or gallop Extremities: extremities normal, atraumatic, no cyanosis or edema Pulses: 2+ and symmetric Skin: Skin color, texture, turgor normal. No rashes or lesions Neurologic: Alert and oriented X 3, normal strength and tone. Normal symmetric reflexes. Normal coordination and gait  EKG sinus bradycardia at 58 with inferior Q waves and anterior Q waves with lateral T-wave inversion. I personally reviewed this EKG  ASSESSMENT AND PLAN:   Hyperlipidemia History of hyperlipidemia not on statin therapy with lipid profile performed 03/11/17 revealing LDL 72 and HDL of 30.  Essential hypertension, benign History of essential hypertension blood pressure measured 144/86. He is on amlodipine and metoprolol. Continue current meds at current dosing.  Occlusion and stenosis of carotid artery with cerebral infarction History of carotid artery disease with known occluded left internal carotid artery status post failed attempt at right carotid artery stenting with subsequent right carotid endarterectomy performed by Dr. Trula Slade 11/27/15. We follow his Doppler studies reveal basis which most recently performed 07/07/16 revealed a widely patent endarterectomy site.  STEMI (ST elevation myocardial infarction) History of CAD status post anterior STEMI 07/02/13 with aspiration thrombectomy, PCI and drug-eluting stenting of the proximal LAD by myself. The right femoral approach. EF at that time was 50%. He has had no subsequent cardiovascular issues and denies chest pain.  Peripheral vascular disease ([place]) History of peripheral arterial disease with a known small abdominal aortic aneurysm status post orbital rotational atherectomy, PTCA and coverage stenting of a highly calcified ostial right common iliac artery stenosis by myself 09/04/13 with staged left common iliac chronic total occlusion PT and coverage stenting. He no longer has  claudication. His last Doppler studies performed 07/07/16 revealed the stents to be widely patent. His ABIs were normal as well. He denies claudication.      Lorretta Harp MD FACP,FACC,FAHA, [facility] [name] [facility] 06/02/2017 9:31 AM

## 2017-06-02 NOTE — Patient Instructions (Addendum)
Medication Instructions: Your physician recommends that you continue on your current medications as directed. Please refer to the Current Medication list given to you today.  Testing/Procedures:  In 1 month:  Your physician has requested that you have an abdominal aorta duplex. During this test, an ultrasound is used to evaluate the aorta. Allow 30 minutes for this exam. Do not eat after midnight the day before and avoid carbonated beverages  Your physician has requested that you have a carotid duplex. This test is an ultrasound of the carotid arteries in your neck. It looks at blood flow through these arteries that supply the brain with blood. Allow one hour for this exam. There are no restrictions or special instructions.  Your physician has requested that you have a aorta and iliac duplex. During this test, an ultrasound is used to evaluate blood flow to the aorta and iliac arteries. Allow one hour for this exam. Do not eat after midnight the day before and avoid carbonated beverages.   Your physician has requested that you have an ankle brachial index (ABI). During this test an ultrasound and blood pressure cuff are used to evaluate the arteries that supply the arms and legs with blood. Allow thirty minutes for this exam. There are no restrictions or special instructions.  Follow-Up: Your physician wants you to follow-up in: 1 year with [name]. You will receive a reminder letter in the mail two months in advance. If you don't receive a letter, please call our office to schedule the follow-up appointment.  If you need a refill on your cardiac medications before your next appointment, please call your pharmacy.

## 2017-06-02 NOTE — Assessment & Plan Note (Signed)
History of hyperlipidemia not on statin therapy with lipid profile performed 03/11/17 revealing LDL 72 and HDL of 30.

## 2017-06-02 NOTE — Assessment & Plan Note (Signed)
History of peripheral arterial disease with a known small abdominal aortic aneurysm status post orbital rotational atherectomy, PTCA and coverage stenting of a highly calcified ostial right common iliac artery stenosis by myself 09/04/13 with staged left common iliac chronic total occlusion PT and coverage stenting. He no longer has claudication. His last Doppler studies performed 07/07/16 revealed the stents to be widely patent. His ABIs were normal as well. He denies claudication.

## 2017-06-02 NOTE — Assessment & Plan Note (Signed)
History of CAD status post anterior STEMI 07/02/13 with aspiration thrombectomy, PCI and drug-eluting stenting of the proximal LAD by myself. The right femoral approach. EF at that time was 50%. He has had no subsequent cardiovascular issues and denies chest pain.

## 2017-06-02 NOTE — Assessment & Plan Note (Signed)
History of carotid artery disease with known occluded left internal carotid artery status post failed attempt at right carotid artery stenting with subsequent right carotid endarterectomy performed by Dr. Trula Slade 11/27/15. We follow his Doppler studies reveal basis which most recently performed 07/07/16 revealed a widely patent endarterectomy site.

## 2017-06-17 ENCOUNTER — Other Ambulatory Visit: Payer: Self-pay | Admitting: Family Medicine

## 2017-06-17 ENCOUNTER — Ambulatory Visit (HOSPITAL_BASED_OUTPATIENT_CLINIC_OR_DEPARTMENT_OTHER)
Admission: RE | Admit: 2017-06-17 | Discharge: 2017-06-17 | Disposition: A | Discharge status: Home / Self Care | Payer: PPO | Source: Ambulatory Visit | Attending: Cardiovascular Disease | Admitting: Cardiovascular Disease

## 2017-06-17 ENCOUNTER — Telehealth: Payer: Self-pay | Admitting: Family Medicine

## 2017-06-17 ENCOUNTER — Ambulatory Visit (HOSPITAL_COMMUNITY)
Admission: RE | Admit: 2017-06-17 | Discharge: 2017-06-17 | Disposition: A | Discharge status: Home / Self Care | Payer: PPO | Source: Ambulatory Visit | Attending: Cardiovascular Disease | Admitting: Cardiovascular Disease

## 2017-06-17 DIAGNOSIS — I6522 Occlusion and stenosis of left carotid artery: Secondary | ICD-10-CM

## 2017-06-17 DIAGNOSIS — I739 Peripheral vascular disease, unspecified: Secondary | ICD-10-CM

## 2017-06-17 MED ORDER — TRAMADOL HCL 50 MG PO TABS: 50.0000 mg | ORAL_TABLET | Freq: Three times a day (TID) | ORAL | 3 refills | Status: DC | PRN

## 2017-06-17 NOTE — Telephone Encounter (Addendum)
Prescription sent to pharmacy. Patient notified

## 2017-06-17 NOTE — Telephone Encounter (Signed)
May have four refills

## 2017-06-17 NOTE — Telephone Encounter (Signed)
Pt is needing refills on traMADol (ULTRAM) 50 MG tablet   [facility]

## 2017-06-18 ENCOUNTER — Telehealth: Payer: Self-pay

## 2017-06-18 DIAGNOSIS — I6522 Occlusion and stenosis of left carotid artery: Secondary | ICD-10-CM

## 2017-06-18 DIAGNOSIS — I779 Disorder of arteries and arterioles, unspecified: Secondary | ICD-10-CM

## 2017-06-18 DIAGNOSIS — I739 Peripheral vascular disease, unspecified: Secondary | ICD-10-CM

## 2017-06-18 NOTE — Telephone Encounter (Signed)
Future orders entered 05-2018 carotid US

## 2017-06-22 ENCOUNTER — Other Ambulatory Visit: Payer: Self-pay | Admitting: Family Medicine

## 2017-06-23 ENCOUNTER — Other Ambulatory Visit: Payer: Self-pay | Admitting: Cardiovascular Disease

## 2017-06-23 DIAGNOSIS — I739 Peripheral vascular disease, unspecified: Secondary | ICD-10-CM

## 2017-06-24 ENCOUNTER — Encounter: Payer: Self-pay | Admitting: Family Medicine

## 2017-06-24 ENCOUNTER — Ambulatory Visit (INDEPENDENT_AMBULATORY_CARE_PROVIDER_SITE_OTHER): Payer: PPO | Admitting: Family Medicine

## 2017-06-24 VITALS — BP 122/84 | Ht 68.0 in | Wt 193.0 lb

## 2017-06-24 DIAGNOSIS — I10 Essential (primary) hypertension: Secondary | ICD-10-CM

## 2017-06-24 NOTE — Progress Notes (Signed)
   Subjective:    Patient ID: TASHA JINDRA, male    DOB: 02/24/1935, 83 y.o.   MRN: [id number]  Hypertension  This is a chronic problem. The current episode started more than 1 year ago. Pertinent negatives include no chest pain, headaches or shortness of breath. Compliance problems: takes meds every day, eats healthy.    Pain in both arms for about 5 years.  Patient relates pain discomfort in both arms uses tramadol intermittently has arthritis in both hands is not felt to be rheumatoid  Under the care of specialist for his peripheral artery disease under good control currently patient does not smoke  Review of Systems  Constitutional: Negative for activity change, fatigue and fever.  HENT: Negative for congestion and rhinorrhea.   Respiratory: Negative for cough and shortness of breath.   Cardiovascular: Negative for chest pain and leg swelling.  Gastrointestinal: Negative for abdominal pain, diarrhea and nausea.  Genitourinary: Negative for dysuria and hematuria.  Neurological: Negative for weakness and headaches.  Psychiatric/Behavioral: Negative for behavioral problems.       Objective:   Physical Exam  Constitutional: He appears well-nourished. No distress.  HENT:  Head: Normocephalic and atraumatic.  Eyes: Right eye exhibits no discharge. Left eye exhibits no discharge.  Neck: No tracheal deviation present.  Cardiovascular: Normal rate, regular rhythm and normal heart sounds.  No murmur heard. Pulmonary/Chest: Effort normal and breath sounds normal. No respiratory distress. He has no wheezes.  Musculoskeletal: He exhibits no edema.  Lymphadenopathy:    He has no cervical adenopathy.  Neurological: He is alert.  Skin: Skin is warm. No rash noted.  Psychiatric: His behavior is normal.  Vitals reviewed.         Assessment & Plan:  Arthralgias uses Ultram does well for him may use Tylenol as needed  HTN good control continue current management  measures Peripheral vascular disease under the care of cardiology Dr. Alvester Chou no other intervention necessary currently  Comprehensive lab work before next visit in 6 months

## 2017-06-24 NOTE — Patient Instructions (Signed)
DASH Eating Plan DASH stands for "Dietary Approaches to Stop Hypertension." The DASH eating plan is a healthy eating plan that has been shown to reduce high blood pressure (hypertension). It may also reduce your risk for type 2 diabetes, heart disease, and stroke. The DASH eating plan may also help with weight loss. What are tips for following this plan? General guidelines  Avoid eating more than 2,300 mg (milligrams) of salt (sodium) a day. If you have hypertension, you may need to reduce your sodium intake to 1,500 mg a day.  Limit alcohol intake to no more than 1 drink a day for nonpregnant women and 2 drinks a day for men. One drink equals 12 oz of beer, 5 oz of wine, or 1 oz of hard liquor.  Work with your health care provider to maintain a healthy body weight or to lose weight. Ask what an ideal weight is for you.  Get at least 30 minutes of exercise that causes your heart to beat faster (aerobic exercise) most days of the week. Activities may include walking, swimming, or biking.  Work with your health care provider or diet and nutrition specialist (dietitian) to adjust your eating plan to your individual calorie needs. Reading food labels  Check food labels for the amount of sodium per serving. Choose foods with less than 5 percent of the Daily Value of sodium. Generally, foods with less than 300 mg of sodium per serving fit into this eating plan.  To find whole grains, look for the word "whole" as the first word in the ingredient list. Shopping  Buy products labeled as "low-sodium" or "no salt added."  Buy fresh foods. Avoid canned foods and premade or frozen meals. Cooking  Avoid adding salt when cooking. Use salt-free seasonings or herbs instead of table salt or sea salt. Check with your health care provider or pharmacist before using salt substitutes.  Do not fry foods. Cook foods using healthy methods such as baking, boiling, grilling, and broiling instead.  Cook with  heart-healthy oils, such as olive, canola, soybean, or sunflower oil. Meal planning   Eat a balanced diet that includes: ? 5 or more servings of fruits and vegetables each day. At each meal, try to fill half of your plate with fruits and vegetables. ? Up to 6-8 servings of whole grains each day. ? Less than 6 oz of lean meat, poultry, or fish each day. A 3-oz serving of meat is about the same size as a deck of cards. One egg equals 1 oz. ? 2 servings of low-fat dairy each day. ? A serving of nuts, seeds, or beans 5 times each week. ? Heart-healthy fats. Healthy fats called Omega-3 fatty acids are found in foods such as flaxseeds and coldwater fish, like sardines, salmon, and mackerel.  Limit how much you eat of the following: ? Canned or prepackaged foods. ? Food that is high in trans fat, such as fried foods. ? Food that is high in saturated fat, such as fatty meat. ? Sweets, desserts, sugary drinks, and other foods with added sugar. ? Full-fat dairy products.  Do not salt foods before eating.  Try to eat at least 2 vegetarian meals each week.  Eat more home-cooked food and less restaurant, buffet, and fast food.  When eating at a restaurant, ask that your food be prepared with less salt or no salt, if possible. What foods are recommended? The items listed may not be a complete list. Talk with your dietitian about what   dietary choices are best for you. Grains Whole-grain or whole-wheat bread. Whole-grain or whole-wheat pasta. Brown rice. Oatmeal. Quinoa. Bulgur. Whole-grain and low-sodium cereals. Pita bread. Low-fat, low-sodium crackers. Whole-wheat flour tortillas. Vegetables Fresh or frozen vegetables (raw, steamed, roasted, or grilled). Low-sodium or reduced-sodium tomato and vegetable juice. Low-sodium or reduced-sodium tomato sauce and tomato paste. Low-sodium or reduced-sodium canned vegetables. Fruits All fresh, dried, or frozen fruit. Canned fruit in natural juice (without  added sugar). Meat and other protein foods Skinless chicken or turkey. Ground chicken or turkey. Pork with fat trimmed off. Fish and seafood. Egg whites. Dried beans, peas, or lentils. Unsalted nuts, nut butters, and seeds. Unsalted canned beans. Lean cuts of beef with fat trimmed off. Low-sodium, lean deli meat. Dairy Low-fat (1%) or fat-free (skim) milk. Fat-free, low-fat, or reduced-fat cheeses. Nonfat, low-sodium ricotta or cottage cheese. Low-fat or nonfat yogurt. Low-fat, low-sodium cheese. Fats and oils Soft margarine without trans fats. Vegetable oil. Low-fat, reduced-fat, or light mayonnaise and salad dressings (reduced-sodium). Canola, safflower, olive, soybean, and sunflower oils. Avocado. Seasoning and other foods Herbs. Spices. Seasoning mixes without salt. Unsalted popcorn and pretzels. Fat-free sweets. What foods are not recommended? The items listed may not be a complete list. Talk with your dietitian about what dietary choices are best for you. Grains Baked goods made with fat, such as croissants, muffins, or some breads. Dry pasta or rice meal packs. Vegetables Creamed or fried vegetables. Vegetables in a cheese sauce. Regular canned vegetables (not low-sodium or reduced-sodium). Regular canned tomato sauce and paste (not low-sodium or reduced-sodium). Regular tomato and vegetable juice (not low-sodium or reduced-sodium). Pickles. Olives. Fruits Canned fruit in a light or heavy syrup. Fried fruit. Fruit in cream or butter sauce. Meat and other protein foods Fatty cuts of meat. Ribs. Fried meat. Bacon. Sausage. Bologna and other processed lunch meats. Salami. Fatback. Hotdogs. Bratwurst. Salted nuts and seeds. Canned beans with added salt. Canned or smoked fish. Whole eggs or egg yolks. Chicken or turkey with skin. Dairy Whole or 2% milk, cream, and half-and-half. Whole or full-fat cream cheese. Whole-fat or sweetened yogurt. Full-fat cheese. Nondairy creamers. Whipped toppings.  Processed cheese and cheese spreads. Fats and oils Butter. Stick margarine. Lard. Shortening. Ghee. Bacon fat. Tropical oils, such as coconut, palm kernel, or palm oil. Seasoning and other foods Salted popcorn and pretzels. Onion salt, garlic salt, seasoned salt, table salt, and sea salt. Worcestershire sauce. Tartar sauce. Barbecue sauce. Teriyaki sauce. Soy sauce, including reduced-sodium. Steak sauce. Canned and packaged gravies. Fish sauce. Oyster sauce. Cocktail sauce. Horseradish that you find on the shelf. Ketchup. Mustard. Meat flavorings and tenderizers. Bouillon cubes. Hot sauce and Tabasco sauce. Premade or packaged marinades. Premade or packaged taco seasonings. Relishes. Regular salad dressings. Where to find more information:  National Heart, Lung, and Blood Institute: www.nhlbi.nih.gov  American Heart Association: www.heart.org Summary  The DASH eating plan is a healthy eating plan that has been shown to reduce high blood pressure (hypertension). It may also reduce your risk for type 2 diabetes, heart disease, and stroke.  With the DASH eating plan, you should limit salt (sodium) intake to 2,300 mg a day. If you have hypertension, you may need to reduce your sodium intake to 1,500 mg a day.  When on the DASH eating plan, aim to eat more fresh fruits and vegetables, whole grains, lean proteins, low-fat dairy, and heart-healthy fats.  Work with your health care provider or diet and nutrition specialist (dietitian) to adjust your eating plan to your individual   calorie needs. This information is not intended to replace advice given to you by your health care provider. Make sure you discuss any questions you have with your health care provider. Document Released: 03/05/2011 Document Revised: 03/09/2016 Document Reviewed: 03/09/2016 Elsevier Interactive Patient Education  2018 Elsevier Inc.  

## 2017-08-24 DIAGNOSIS — H35033 Hypertensive retinopathy, bilateral: Secondary | ICD-10-CM | POA: Diagnosis not present

## 2017-08-24 DIAGNOSIS — H40013 Open angle with borderline findings, low risk, bilateral: Secondary | ICD-10-CM | POA: Diagnosis not present

## 2017-08-28 DIAGNOSIS — S0180XA Unspecified open wound of other part of head, initial encounter: Secondary | ICD-10-CM | POA: Diagnosis not present

## 2017-08-28 DIAGNOSIS — G501 Atypical facial pain: Secondary | ICD-10-CM | POA: Diagnosis not present

## 2017-10-22 DIAGNOSIS — L570 Actinic keratosis: Secondary | ICD-10-CM | POA: Diagnosis not present

## 2017-10-22 DIAGNOSIS — C44612 Basal cell carcinoma of skin of right upper limb, including shoulder: Secondary | ICD-10-CM | POA: Diagnosis not present

## 2017-10-22 DIAGNOSIS — X32XXXD Exposure to sunlight, subsequent encounter: Secondary | ICD-10-CM | POA: Diagnosis not present

## 2017-10-22 DIAGNOSIS — L720 Epidermal cyst: Secondary | ICD-10-CM | POA: Diagnosis not present

## 2017-12-04 ENCOUNTER — Other Ambulatory Visit: Payer: Self-pay | Admitting: Family Medicine

## 2017-12-24 ENCOUNTER — Encounter: Payer: Self-pay | Admitting: Family Medicine

## 2017-12-24 ENCOUNTER — Ambulatory Visit (INDEPENDENT_AMBULATORY_CARE_PROVIDER_SITE_OTHER): Payer: PPO | Admitting: Family Medicine

## 2017-12-24 ENCOUNTER — Ambulatory Visit (HOSPITAL_COMMUNITY)
Admission: RE | Admit: 2017-12-24 | Discharge: 2017-12-24 | Disposition: A | Discharge status: Home / Self Care | Payer: PPO | Source: Ambulatory Visit | Attending: Family Medicine | Admitting: Family Medicine

## 2017-12-24 VITALS — BP 116/72 | Ht 68.0 in | Wt 185.6 lb

## 2017-12-24 DIAGNOSIS — E7849 Other hyperlipidemia: Secondary | ICD-10-CM

## 2017-12-24 DIAGNOSIS — M4312 Spondylolisthesis, cervical region: Secondary | ICD-10-CM | POA: Diagnosis not present

## 2017-12-24 DIAGNOSIS — M549 Dorsalgia, unspecified: Secondary | ICD-10-CM | POA: Diagnosis not present

## 2017-12-24 DIAGNOSIS — M47812 Spondylosis without myelopathy or radiculopathy, cervical region: Secondary | ICD-10-CM | POA: Diagnosis not present

## 2017-12-24 DIAGNOSIS — I700 Atherosclerosis of aorta: Secondary | ICD-10-CM | POA: Diagnosis not present

## 2017-12-24 DIAGNOSIS — M47814 Spondylosis without myelopathy or radiculopathy, thoracic region: Secondary | ICD-10-CM | POA: Insufficient documentation

## 2017-12-24 DIAGNOSIS — R296 Repeated falls: Secondary | ICD-10-CM

## 2017-12-24 DIAGNOSIS — M546 Pain in thoracic spine: Secondary | ICD-10-CM | POA: Insufficient documentation

## 2017-12-24 DIAGNOSIS — M25562 Pain in left knee: Secondary | ICD-10-CM | POA: Diagnosis not present

## 2017-12-24 DIAGNOSIS — I6523 Occlusion and stenosis of bilateral carotid arteries: Secondary | ICD-10-CM | POA: Insufficient documentation

## 2017-12-24 DIAGNOSIS — M542 Cervicalgia: Secondary | ICD-10-CM | POA: Diagnosis not present

## 2017-12-24 DIAGNOSIS — I10 Essential (primary) hypertension: Secondary | ICD-10-CM | POA: Diagnosis not present

## 2017-12-24 DIAGNOSIS — D696 Thrombocytopenia, unspecified: Secondary | ICD-10-CM | POA: Diagnosis not present

## 2017-12-24 NOTE — Progress Notes (Signed)
   Subjective:    Patient ID: [name] [name], male    DOB: 03-06-1935, 83 y.o.   MRN: [id number]  Hypertension  This is a chronic problem. The current episode started more than 1 year ago. Pertinent negatives include no chest pain, headaches or shortness of breath. Risk factors for coronary artery disease include male gender. Treatments tried: norvasc, lopressor. There are no compliance problems.     Patient has fallen 2 times and feels like he needs to see orthopedic.  His wife currently is seeing chiropractic care they are treating her with adjustments she wonders if her husband should do the same she is concerned about mid back pain neck pain since his fall he does have significant arthritis issues.  She also wonders if he would benefit from seeing orthopedics We talked at length about the importance of minimizing the risk of falls and monitoring closely  Has thrombocytopenia on multiple medicines we will monitor with CBC denies any bleeding issues  Has known cardiac disease but denies any chest tightness pressure pain shortness of breath  Has hyperlipidemia watch his diet but does not want to be on any statins  25 minutes was spent with the patient.  This statement verifies that 25 minutes was indeed spent with the patient.  More than 50% of this visit-total duration of the visit-was spent in counseling and coordination of care. The issues that the patient came in for today as reflected in the diagnosis (s) please refer to documentation for further details.   Review of Systems  Constitutional: Negative for activity change, fatigue and fever.  HENT: Negative for congestion and rhinorrhea.   Respiratory: Negative for cough and shortness of breath.   Cardiovascular: Negative for chest pain and leg swelling.  Gastrointestinal: Negative for abdominal pain, diarrhea and nausea.  Genitourinary: Negative for dysuria and hematuria.  Neurological: Negative for weakness and headaches.    Psychiatric/Behavioral: Negative for agitation and behavioral problems.       Objective:   Physical Exam  Constitutional: He appears well-nourished. No distress.  HENT:  Head: Normocephalic and atraumatic.  Eyes: Right eye exhibits no discharge. Left eye exhibits no discharge.  Neck: No tracheal deviation present.  Cardiovascular: Normal rate, regular rhythm and normal heart sounds.  No murmur heard. Pulmonary/Chest: Effort normal and breath sounds normal. No respiratory distress.  Musculoskeletal: He exhibits no edema.  Lymphadenopathy:    He has no cervical adenopathy.  Neurological: He is alert. Coordination normal.  Skin: Skin is warm and dry.  Psychiatric: He has a normal mood and affect. His behavior is normal.  Vitals reviewed.  Orthostatic blood pressures were done without difficulty.  Patient is able to ambulate with a cane he picks his feet up adequately but certainly is not as fluid with his walking as he was 20 years ago  Mild cervical subjective pain and mid back pain no acute or unilateral findings Left knee is wearing a brace he complains of pain with the knee but did not injure the knee     Assessment & Plan:  Cervical pain-x-rays recent fall  Mid back pain recent fall x-rays  Referral to orthopedics for left knee pain frequent falls would benefit from physical therapy  Follow-up 3 months  Thrombocytopenia check CBC no bleeding issues currently  Hyperlipidemia patient does not want to be on medications continue watching diet closely  Left knee pain related to the fall referral to orthopedics  Hypertension decent control continue current measures

## 2017-12-25 ENCOUNTER — Encounter: Payer: Self-pay | Admitting: Family Medicine

## 2017-12-25 LAB — BASIC METABOLIC PANEL
BUN/Creatinine Ratio: 18 (ref 10–24)
BUN: 17 mg/dL (ref 8–27)
CALCIUM: 9.3 mg/dL (ref 8.6–10.2)
CO2: 25 mmol/L (ref 20–29)
CREATININE: 0.97 mg/dL (ref 0.76–1.27)
Chloride: 102 mmol/L (ref 96–106)
GFR, EST AFRICAN AMERICAN: 85 mL/min/{1.73_m2} (ref >59)
GFR, EST NON AFRICAN AMERICAN: 73 mL/min/{1.73_m2} (ref >59)
Glucose: 99 mg/dL (ref 65–99)
Potassium: 4.8 mmol/L (ref 3.5–5.2)
Sodium: 141 mmol/L (ref 134–144)

## 2017-12-25 LAB — CBC WITH DIFFERENTIAL/PLATELET
BASOS ABS: 0.1 10*3/uL (ref 0.0–0.2)
Basos: 1 %
EOS (ABSOLUTE): 0.4 10*3/uL (ref 0.0–0.4)
Eos: 5 %
Hematocrit: 50.8 % (ref 37.5–51.0)
Hemoglobin: 17.5 g/dL (ref 13.0–17.7)
IMMATURE GRANS (ABS): 0 10*3/uL (ref 0.0–0.1)
IMMATURE GRANULOCYTES: 0 %
Lymphocytes Absolute: 1.6 10*3/uL (ref 0.7–3.1)
Lymphs: 20 %
MCH: 30.4 pg (ref 26.6–33.0)
MCHC: 34.4 g/dL (ref 31.5–35.7)
MCV: 88 fL (ref 79–97)
Monocytes Absolute: 0.6 10*3/uL (ref 0.1–0.9)
Monocytes: 8 %
NEUTROS PCT: 66 %
Neutrophils Absolute: 5.1 10*3/uL (ref 1.4–7.0)
PLATELETS: 116 10*3/uL — AB (ref 150–450)
RBC: 5.75 x10E6/uL (ref 4.14–5.80)
RDW: 15 % (ref 12.3–15.4)
WBC: 7.8 10*3/uL (ref 3.4–10.8)

## 2017-12-25 LAB — HEPATIC FUNCTION PANEL
ALK PHOS: 99 IU/L (ref 39–117)
ALT: 12 IU/L (ref 0–44)
AST: 18 IU/L (ref 0–40)
Albumin: 4.4 g/dL (ref 3.5–4.7)
Bilirubin Total: 0.5 mg/dL (ref 0.0–1.2)
Bilirubin, Direct: 0.14 mg/dL (ref 0.00–0.40)
TOTAL PROTEIN: 6.7 g/dL (ref 6.0–8.5)

## 2017-12-25 LAB — LIPID PANEL
Chol/HDL Ratio: 4.5 ratio (ref 0.0–5.0)
Cholesterol, Total: 136 mg/dL (ref 100–199)
HDL: 30 mg/dL — AB (ref >39)
LDL Calculated: 68 mg/dL (ref 0–99)
Triglycerides: 191 mg/dL — ABNORMAL HIGH (ref 0–149)
VLDL CHOLESTEROL CAL: 38 mg/dL (ref 5–40)

## 2017-12-31 DIAGNOSIS — H40013 Open angle with borderline findings, low risk, bilateral: Secondary | ICD-10-CM | POA: Diagnosis not present

## 2018-01-11 DIAGNOSIS — M542 Cervicalgia: Secondary | ICD-10-CM | POA: Insufficient documentation

## 2018-01-11 DIAGNOSIS — M546 Pain in thoracic spine: Secondary | ICD-10-CM | POA: Insufficient documentation

## 2018-01-11 DIAGNOSIS — M25562 Pain in left knee: Secondary | ICD-10-CM | POA: Diagnosis not present

## 2018-01-11 DIAGNOSIS — M25511 Pain in right shoulder: Secondary | ICD-10-CM | POA: Diagnosis not present

## 2018-01-11 DIAGNOSIS — M25512 Pain in left shoulder: Secondary | ICD-10-CM | POA: Diagnosis not present

## 2018-01-12 ENCOUNTER — Other Ambulatory Visit: Payer: Self-pay | Admitting: Family Medicine

## 2018-01-12 ENCOUNTER — Telehealth: Payer: Self-pay | Admitting: Family Medicine

## 2018-01-12 MED ORDER — TRAMADOL HCL 50 MG PO TABS: 50.0000 mg | ORAL_TABLET | Freq: Three times a day (TID) | ORAL | 3 refills | Status: DC | PRN

## 2018-01-12 NOTE — Telephone Encounter (Signed)
Contacted patient wife and informed her that medication was sent in. Pt wife verbalized understanding.

## 2018-01-12 NOTE — Telephone Encounter (Signed)
Pt's wife calling in regard of pt needing a refill for traMADol (ULTRAM) 50 MG tablet.   Pharmacy: [facility] - [place], [place]  Last appt: 12/24/17 Nxt appt: 03/17/18  Advise.

## 2018-01-12 NOTE — Telephone Encounter (Signed)
Medication was sent electronically to her mail order

## 2018-01-12 NOTE — Telephone Encounter (Signed)
Please advise. Thank you

## 2018-01-18 NOTE — Telephone Encounter (Signed)
Pt calling in stating that [facility] is needing something stating that the pt does need the Tramadol. They wont fill it until they have that. The pt says [facility] faxed over something for the doc to fill. He is out of the medication.

## 2018-01-18 NOTE — Telephone Encounter (Signed)
PA sent to plan 01/18/18

## 2018-01-18 NOTE — Telephone Encounter (Signed)
[place] [facility] and [facility] states that a PA needs to be done on the Tramadol 50 mg. Contacted patient and informed patient of situation. Pt verbalized understanding.

## 2018-01-19 NOTE — Telephone Encounter (Signed)
Spoke with wife and informed her that Tramadol has been approved. Pt wife verbalized understanding

## 2018-01-19 NOTE — Telephone Encounter (Signed)
Patient returning our call. Nurse not available so I read the message in his chart and let him know his Tramadol had been approved.  He is going to check with the pharmacy and call us back with any problems

## 2018-01-19 NOTE — Telephone Encounter (Signed)
Tramadol 50 mg approved from 10//22/19-11/07/2017. Left message to return call to inform patient.

## 2018-01-24 DIAGNOSIS — G5621 Lesion of ulnar nerve, right upper limb: Secondary | ICD-10-CM | POA: Insufficient documentation

## 2018-01-24 DIAGNOSIS — G5603 Carpal tunnel syndrome, bilateral upper limbs: Secondary | ICD-10-CM | POA: Diagnosis not present

## 2018-01-24 DIAGNOSIS — G5622 Lesion of ulnar nerve, left upper limb: Secondary | ICD-10-CM | POA: Diagnosis not present

## 2018-01-26 DIAGNOSIS — M542 Cervicalgia: Secondary | ICD-10-CM | POA: Diagnosis not present

## 2018-01-26 DIAGNOSIS — M25562 Pain in left knee: Secondary | ICD-10-CM | POA: Diagnosis not present

## 2018-01-26 DIAGNOSIS — M545 Low back pain: Secondary | ICD-10-CM | POA: Diagnosis not present

## 2018-01-26 DIAGNOSIS — M5412 Radiculopathy, cervical region: Secondary | ICD-10-CM | POA: Diagnosis not present

## 2018-01-26 DIAGNOSIS — M25561 Pain in right knee: Secondary | ICD-10-CM | POA: Diagnosis not present

## 2018-02-01 DIAGNOSIS — G5603 Carpal tunnel syndrome, bilateral upper limbs: Secondary | ICD-10-CM | POA: Diagnosis not present

## 2018-02-02 ENCOUNTER — Telehealth: Payer: Self-pay | Admitting: *Deleted

## 2018-02-02 NOTE — Telephone Encounter (Signed)
   [facility] Pre-operative Risk Assessment    Request for surgical clearance:  1. What type of surgery is being performed? Left carpal tunnel release   2. When is this surgery scheduled? 03/02/18   3. What type of clearance is required (medical clearance vs. Pharmacy clearance to hold med vs. Both)? both  4. Are there any medications that need to be held prior to surgery and how long? Plavix and Aspirin   5. Practice name and name of physician performing surgery? [facility] Dr. Veverly Fells   6. What is your office phone number [phone]    7.   What is your office fax number [phone] attention: Santiago Bur  8.   Anesthesia type (None, local, MAC, general) ? Local    [name] [name] [name] [name] 02/02/2018, 12:40 PM  _________________________________________________________________   (provider comments below)

## 2018-02-03 DIAGNOSIS — M545 Low back pain: Secondary | ICD-10-CM | POA: Diagnosis not present

## 2018-02-03 DIAGNOSIS — M25562 Pain in left knee: Secondary | ICD-10-CM | POA: Diagnosis not present

## 2018-02-03 DIAGNOSIS — M5412 Radiculopathy, cervical region: Secondary | ICD-10-CM | POA: Diagnosis not present

## 2018-02-03 DIAGNOSIS — M542 Cervicalgia: Secondary | ICD-10-CM | POA: Diagnosis not present

## 2018-02-04 NOTE — Telephone Encounter (Signed)
According to Dr. Kennon Holter last note the patient has been stable but last saw him in March 2019.  The patient continues to be asymptomatic okay to hold aspirin and Plavix for 5 days.  If there is any change in symptoms would recommend preoperative office visit with [name] prior to his scheduled procedure

## 2018-02-04 NOTE — Telephone Encounter (Signed)
   Primary Cardiologist: Quay Burow, MD  Chart reviewed as part of pre-operative protocol coverage. History as outlined in earlier note. Revised cardiac risk index is 6.6% indicating moderate risk of CV complications. I spoke with the patient who states he is very active. He mows the yard, takes care of housework, has no issues going up steps, and walks without any anginal symptoms, SOB, palpitations or syncope. He just got done mopping without any problem. Therefore, based on ACC/AHA guidelines, the patient would be at acceptable risk for the planned procedure without further cardiovascular testing.   [name] is out of the office, but Dr. Claiborne Billings reviewed the chart. He states, If "the patient continues to be asymptomatic okay to hold aspirin and Plavix for 5 days.  If there is any change in symptoms would recommend preoperative office visit with [name] prior to his scheduled procedure." The patient affirms he feels he is doing great.  I will route this recommendation to the requesting party via Epic fax function and remove from pre-op pool.  Please call with questions.  [name], PA-C 02/04/2018, 3:33 PM

## 2018-02-04 NOTE — Telephone Encounter (Addendum)
   Primary Cardiologist: Quay Burow, MD  Chart reviewed as part of pre-operative protocol coverage. Patient was contacted 02/04/2018 in reference to pre-operative risk assessment for pending surgery as outlined below.  [name] [name] was last seen on 05/2017 by Dr Gwenlyn Found. He has complex PMH with NSTEMI 2002 s/p PCI, and anterior STEMI in 2015 with stenting. He also has PAD s/p PTCA, stroke, carotid artery disease with aborted R carotid stenting with subsequent R CEA. Revised cardiac risk index is 6.6%. Patient will need phone call after clarification of holding antiplatelets.  Will route to [name] for input on holding ASA and Plavix as requested below - note numerous indications for antiplatelets including CAD, PAD, carotid dz, CVA. [name] - Please route response to P CV DIV PREOP (the pre-op pool). Thank you.  [name], PA-C 02/04/2018, 10:12 AM

## 2018-02-09 ENCOUNTER — Telehealth: Payer: Self-pay | Admitting: *Deleted

## 2018-02-09 DIAGNOSIS — M25562 Pain in left knee: Secondary | ICD-10-CM | POA: Diagnosis not present

## 2018-02-09 NOTE — Telephone Encounter (Signed)
[facility]  SURGICAL CLEARANCE  FOR PATIENT AS REQUEST.  FIRST TRANSMISSION WAS 02/04/18

## 2018-02-17 DIAGNOSIS — M25562 Pain in left knee: Secondary | ICD-10-CM | POA: Diagnosis not present

## 2018-02-21 DIAGNOSIS — M25562 Pain in left knee: Secondary | ICD-10-CM | POA: Diagnosis not present

## 2018-03-02 DIAGNOSIS — G5602 Carpal tunnel syndrome, left upper limb: Secondary | ICD-10-CM | POA: Diagnosis not present

## 2018-03-08 ENCOUNTER — Other Ambulatory Visit: Payer: Self-pay

## 2018-03-08 DIAGNOSIS — Z5189 Encounter for other specified aftercare: Secondary | ICD-10-CM | POA: Insufficient documentation

## 2018-03-09 MED ORDER — AMLODIPINE BESYLATE 10 MG PO TABS: 10.0000 mg | ORAL_TABLET | Freq: Every day | ORAL | 1 refills | Status: DC

## 2018-03-09 MED ORDER — PANTOPRAZOLE SODIUM 20 MG PO TBEC: 20.0000 mg | DELAYED_RELEASE_TABLET | Freq: Two times a day (BID) | ORAL | 1 refills | Status: DC

## 2018-03-09 MED ORDER — METOPROLOL TARTRATE 50 MG PO TABS: 50.0000 mg | ORAL_TABLET | Freq: Two times a day (BID) | ORAL | 1 refills | Status: DC

## 2018-03-09 NOTE — Telephone Encounter (Signed)
Refill all x6 

## 2018-03-14 ENCOUNTER — Telehealth: Payer: Self-pay | Admitting: Family Medicine

## 2018-03-14 DIAGNOSIS — D696 Thrombocytopenia, unspecified: Secondary | ICD-10-CM

## 2018-03-14 NOTE — Telephone Encounter (Signed)
Last labs BMP, Lipid, Hepatic and CBC on 12/24/17. Please advise. Thank you

## 2018-03-14 NOTE — Telephone Encounter (Signed)
pts wife calling requesting Dr. Nicki Reaper place lab orders for pts appt on 03/21/18.

## 2018-03-15 DIAGNOSIS — Z5189 Encounter for other specified aftercare: Secondary | ICD-10-CM | POA: Diagnosis not present

## 2018-03-15 NOTE — Telephone Encounter (Signed)
Please advise. Thank you

## 2018-03-15 NOTE — Telephone Encounter (Signed)
Orders for cbc put in and pt notified.

## 2018-03-15 NOTE — Telephone Encounter (Signed)
pts wife checking on status of labs being ordered. She is hoping to take him in the morning to get them.

## 2018-03-15 NOTE — Telephone Encounter (Signed)
The only lab that I would recommend repeating at this moment would be CBC due to thrombocytopenia

## 2018-03-16 DIAGNOSIS — D696 Thrombocytopenia, unspecified: Secondary | ICD-10-CM | POA: Diagnosis not present

## 2018-03-17 ENCOUNTER — Ambulatory Visit: Payer: PPO | Admitting: Family Medicine

## 2018-03-17 LAB — CBC WITH DIFFERENTIAL/PLATELET
BASOS: 1 %
Basophils Absolute: 0.1 10*3/uL (ref 0.0–0.2)
EOS (ABSOLUTE): 0.2 10*3/uL (ref 0.0–0.4)
EOS: 3 %
HEMATOCRIT: 49.8 % (ref 37.5–51.0)
Hemoglobin: 17.2 g/dL (ref 13.0–17.7)
IMMATURE GRANULOCYTES: 0 %
Immature Grans (Abs): 0 10*3/uL (ref 0.0–0.1)
LYMPHS ABS: 1.2 10*3/uL (ref 0.7–3.1)
Lymphs: 15 %
MCH: 30 pg (ref 26.6–33.0)
MCHC: 34.5 g/dL (ref 31.5–35.7)
MCV: 87 fL (ref 79–97)
MONOS ABS: 0.6 10*3/uL (ref 0.1–0.9)
Monocytes: 7 %
NEUTROS ABS: 6.1 10*3/uL (ref 1.4–7.0)
Neutrophils: 74 %
PLATELETS: 122 10*3/uL — AB (ref 150–450)
RBC: 5.74 x10E6/uL (ref 4.14–5.80)
RDW: 13.6 % (ref 12.3–15.4)
WBC: 8.2 10*3/uL (ref 3.4–10.8)

## 2018-03-21 ENCOUNTER — Encounter: Payer: Self-pay | Admitting: Family Medicine

## 2018-03-21 ENCOUNTER — Ambulatory Visit (INDEPENDENT_AMBULATORY_CARE_PROVIDER_SITE_OTHER): Payer: PPO | Admitting: Family Medicine

## 2018-03-21 VITALS — BP 138/88 | Ht 68.0 in | Wt 187.8 lb

## 2018-03-21 DIAGNOSIS — I10 Essential (primary) hypertension: Secondary | ICD-10-CM | POA: Diagnosis not present

## 2018-03-21 DIAGNOSIS — D696 Thrombocytopenia, unspecified: Secondary | ICD-10-CM | POA: Insufficient documentation

## 2018-03-21 NOTE — Patient Instructions (Signed)
Results for orders placed or performed in visit on 03/14/18  CBC with Differential/Platelet  Result Value Ref Range   WBC 8.2 3.4 - 10.8 x10E3/uL   RBC 5.74 4.14 - 5.80 x10E6/uL   Hemoglobin 17.2 13.0 - 17.7 g/dL   Hematocrit 49.8 37.5 - 51.0 %   MCV 87 79 - 97 fL   MCH 30.0 26.6 - 33.0 pg   MCHC 34.5 31.5 - 35.7 g/dL   RDW 13.6 12.3 - 15.4 %   Platelets 122 (L) 150 - 450 x10E3/uL   Neutrophils 74 Not Estab. %   Lymphs 15 Not Estab. %   Monocytes 7 Not Estab. %   Eos 3 Not Estab. %   Basos 1 Not Estab. %   Neutrophils Absolute 6.1 1.4 - 7.0 x10E3/uL   Lymphocytes Absolute 1.2 0.7 - 3.1 x10E3/uL   Monocytes Absolute 0.6 0.1 - 0.9 x10E3/uL   EOS (ABSOLUTE) 0.2 0.0 - 0.4 x10E3/uL   Basophils Absolute 0.1 0.0 - 0.2 x10E3/uL   Immature Granulocytes 0 Not Estab. %   Immature Grans (Abs) 0.0 0.0 - 0.1 x10E3/uL

## 2018-03-21 NOTE — Progress Notes (Signed)
   Subjective:    Patient ID: [name] [name], male    DOB: 1934/05/19, 83 y.o.   MRN: [id number]  Hypertension  This is a chronic problem. Pertinent negatives include no chest pain, headaches or shortness of breath. (Pt BP did increase when surgery but did come down) There are no compliance problems.    Pt had carpal tunnel surgery and has been going to PT for legs.  Patient has brace on left knee start and get around a little bit better  Review of Systems  Constitutional: Negative for diaphoresis and fatigue.  HENT: Negative for congestion and rhinorrhea.   Respiratory: Negative for cough and shortness of breath.   Cardiovascular: Negative for chest pain and leg swelling.  Gastrointestinal: Negative for abdominal pain and diarrhea.  Skin: Negative for color change and rash.  Neurological: Negative for dizziness and headaches.  Psychiatric/Behavioral: Negative for behavioral problems and confusion.       Objective:   Physical Exam Vitals signs reviewed.  Constitutional:      General: He is not in acute distress. HENT:     Head: Normocephalic and atraumatic.  Eyes:     General:        Right eye: No discharge.        Left eye: No discharge.  Neck:     Trachea: No tracheal deviation.  Cardiovascular:     Rate and Rhythm: Normal rate and regular rhythm.     Heart sounds: Normal heart sounds. No murmur.  Pulmonary:     Effort: Pulmonary effort is normal. No respiratory distress.     Breath sounds: Normal breath sounds.  Lymphadenopathy:     Cervical: No cervical adenopathy.  Skin:    General: Skin is warm and dry.  Neurological:     Mental Status: He is alert.     Coordination: Coordination normal.  Psychiatric:        Behavior: Behavior normal.    Denies any bleeding issues denies bruising issues had recent CBC which showed platelets slightly low       Assessment & Plan:  HTN- Patient was seen today as part of a visit regarding hypertension. The importance of  healthy diet and regular physical activity was discussed. The importance of compliance with medications discussed.  Ideal goal is to keep blood pressure low elevated levels certainly below 794/80 when possible.  The patient was counseled that keeping blood pressure under control lessen his risk of complications.  The importance of regular follow-ups was discussed with the patient.  Low-salt diet such as DASH recommended.  Regular physical activity was recommended as well.  Patient was advised to keep regular follow-ups.  Thrombocytopenia not at risk of bleeding excessively continue with current medications that cardiology has him on Patient denies any bleeding issues bruising issues we will monitor this periodically

## 2018-04-12 DIAGNOSIS — G5601 Carpal tunnel syndrome, right upper limb: Secondary | ICD-10-CM | POA: Insufficient documentation

## 2018-04-22 ENCOUNTER — Telehealth: Payer: Self-pay | Admitting: *Deleted

## 2018-04-22 NOTE — Telephone Encounter (Signed)
   [facility] Pre-operative Risk Assessment    Request for surgical clearance:  1. What type of surgery is being performed? RIGHT CARPAL TUNNEL RELEASE   2. When is this surgery scheduled? TBD   3. What type of clearance is required (medical clearance vs. Pharmacy clearance to hold med vs. Both)? BOTH  4. Are there any medications that need to be held prior to surgery and how long?PLAVIX   5. Practice name and name of physician performing surgery? [facility]    6. What is your office phone number? [phone]     7.   What is your office fax number? [place]   8.   Anesthesia type (None, local, MAC, general) ? LOCAL

## 2018-04-24 ENCOUNTER — Telehealth: Payer: Self-pay | Admitting: Family Medicine

## 2018-04-24 NOTE — Telephone Encounter (Signed)
[name] [name] I received a request regarding this patient stopping his Plavix for a carpal tunnel release surgery This patient has significant cardiovascular issues I recommend that this surgical release be through [name] his cardiologist Please fax this request to their office  If possible fax a note to [facility] Dr.Norris letting them know that we are recommending his surgical preoperative release be via Dr. Karoline Caldwell cardiology Thank you

## 2018-04-25 NOTE — Telephone Encounter (Signed)
Faxed over form to Dr. Kennon Holter office.

## 2018-04-25 NOTE — Telephone Encounter (Signed)
   Primary Cardiologist: Quay Burow, MD  Chart reviewed as part of pre-operative protocol coverage. Attempted to contact patietn 04/25/2018, however voicemail box was not set up on his wife's phone and there was no voicemail associated with his home number.   Per preop recommendations 02/2018, patient was recommended to hold plavix 5 days.   Will attempt to contact patient again at a later time.   Abigail Butts, PA-C 04/25/2018, 10:19 AM

## 2018-04-25 NOTE — Telephone Encounter (Signed)
   Primary Cardiologist: Quay Burow, MD  Chart reviewed as part of pre-operative protocol coverage. Patient was contacted 04/25/2018 in reference to pre-operative risk assessment for pending surgery as outlined below.  [name] [name] was last seen on 06/23/17 by [name].  Since that day, [name] [name] has done well from a cardiac standpoint. He is able to complete 4 METs without anginal complaints. He was cleared for the same procedure 01/2018 at which time he was without cardiac complaints as well.  Therefore, based on ACC/AHA guidelines, the patient would be at acceptable risk for the planned procedure without further cardiovascular testing.   Patient was cleared 01/2018 to hold aspirin and plavix x5 days prior to left carpal tunnel surgery. Given no change in cardiac status since that time, patient may again hold his aspirin and plavix 5 days prior to upcoming right carpal tunnel surgery.   I will route this recommendation to the requesting party via Epic fax function and remove from pre-op pool.  Please call with questions.  Abigail Butts, PA-C 04/25/2018, 11:03 AM

## 2018-05-05 ENCOUNTER — Other Ambulatory Visit: Payer: Self-pay

## 2018-05-05 MED ORDER — CLOPIDOGREL BISULFATE 75 MG PO TABS: 75.0000 mg | ORAL_TABLET | Freq: Every day | ORAL | 1 refills | Status: DC

## 2018-05-18 DIAGNOSIS — G5601 Carpal tunnel syndrome, right upper limb: Secondary | ICD-10-CM | POA: Diagnosis not present

## 2018-05-31 DIAGNOSIS — Z5189 Encounter for other specified aftercare: Secondary | ICD-10-CM | POA: Diagnosis not present

## 2018-06-03 ENCOUNTER — Encounter: Payer: Self-pay | Admitting: Cardiovascular Disease

## 2018-06-03 ENCOUNTER — Ambulatory Visit: Payer: PPO | Admitting: Cardiovascular Disease

## 2018-06-03 VITALS — BP 174/78 | HR 57 | Ht 69.0 in | Wt 184.4 lb

## 2018-06-03 DIAGNOSIS — E782 Mixed hyperlipidemia: Secondary | ICD-10-CM | POA: Diagnosis not present

## 2018-06-03 DIAGNOSIS — I2102 ST elevation (STEMI) myocardial infarction involving left anterior descending coronary artery: Secondary | ICD-10-CM

## 2018-06-03 DIAGNOSIS — I10 Essential (primary) hypertension: Secondary | ICD-10-CM

## 2018-06-03 DIAGNOSIS — I779 Disorder of arteries and arterioles, unspecified: Secondary | ICD-10-CM

## 2018-06-03 DIAGNOSIS — I739 Peripheral vascular disease, unspecified: Secondary | ICD-10-CM | POA: Diagnosis not present

## 2018-06-03 NOTE — Progress Notes (Signed)
06/03/2018 [name] [name]   [birth year]  [id number]  Primary Physician Kathyrn Drown, MD Primary Cardiologist: Lorretta Harp MD FACP, [place], [place], [name]  HPI:  [name] [name] is a 84 y.o.  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 I last saw him  06/02/2017.[name] He was admitted on 07/02/13 for Anterior STEMI. Presenting symptoms included severe epigastric burning pain/ indigestion and left arm pain. Initial troponin was >20.0. He underwent emergent LHC and successful aspiration thrombectomy, PCI and stenting of proximal LAD by [name], via the right femoral artery . The overall LVEF was estimated at 50%, without wall motion abnormalities. He tolerated the procedure well and left the cath lab in stable condition. DAPT with ASA and Brilinta was initiated. He was also placed on a BB, ACE-I and statin. His post PCI course was w/o complication. He was discharged home on 07/05/13.  It should also be noted that at time of emergent LHC, he was found to also have significant peripheral vascular disease with an occluded left iliac and high-grade right common iliac artery stenosis, as well as a small AAA. The patient complained of bilateral LEE. [name] ordered for him to undergo bilateral LEAs. The study was performed in our office on 07/18/13. Findings are as follows: ABI 0.95 on Rt, 0.59 on Lt.the patient underwent angiography, diamondback orbital rotational arthrectomy, PTA and stenting of a highly calcified ostial right common iliac artery stenosis. He had excellent angiographic and clinical result. His Dopplers normalized to no longer has right lower extremity claudication. He wishes to proceed with attempt at percutaneous opacification of his left common iliac chronic total occlusion. He is not tolerating his Brilenta because of shortness of breath and we subsequently transitioned to Plavix. He recently had a left  brain stroke thought to be related to a watershed infarct from a hemodynamically significant right carotid stenosis in the setting of an occluded left carotid. He was evaluated by Dr. Erlinda Hong from [facility]. Dr. Erlinda Hong thought he would be a candidate for carotid stenting should he be found have a right internal carotid artery stenosis of greater than 70%.I intubated him on 05/15/14 revealing at most a 40-50% smooth right internal artery stenosis. I suspect his carotid ultrasound overestimated the degree of stenosis because the contralateral occlusion.  Since I saw him a year ago he's remained stable. He denies chest pain, shortness of breath or claudication. He does want to have a left total knee replacement. A pharmacologic Myoview stress test was intermediate risk with scar and peri-infarct ischemia. His EF is in the 45-55% range. Because of progressively worsening Doppler studies a CT scan scan was performed that showed progression of his right internal carotid artery stenosis compared to his prior CTA. He did have some right common carotid origin stenosis as well. He was neurologically asymptomatic. I did angiogram him approximately 18 months ago revealing at most 50-60% right ICA stenosis. I suspected that the Dopplers overestimated the degree of stenosis because of contralateral occlusion. Because of progression of disease he underwent angiography by myself 10/29/15 with the intent to perform right internal carotid artery stenting. Unfortunately, because of the degree of calcification and tortuosity and was never able to safely access the right common carotid artery and therefore aborted the procedure. The patient also underwent uncomplicated right carotid endarterectomy by Dr. Trula Slade 11/27/15 which he has nicely recovered from.Recent carotid Dopplers performed 01/15/16 revealed  a widely patent right carotid endarterectomy site.  Since I saw me year ago he's remained asymptomatic. Denies chest  pain, shortness of breath or claudication. His Dopplers performed year ago revealed widely patent right carotid endarterectomy site as well as patent iliac stents.    Current Meds  Medication Sig  . acetaminophen (TYLENOL) 500 MG tablet Take 500 mg by mouth every 6 (six) hours as needed for mild pain or moderate pain.   [name] amLODipine (NORVASC) 10 MG tablet Take 1 tablet (10 mg total) by mouth daily.  [name] aspirin EC 81 MG tablet Take 81 mg by mouth daily.  . clopidogrel (PLAVIX) 75 MG tablet Take 1 tablet (75 mg total) by mouth daily.  . methylcellulose (ARTIFICIAL TEARS) 1 % ophthalmic solution Place 1 drop into both eyes as needed (for dry eyes).  . metoprolol tartrate (LOPRESSOR) 50 MG tablet Take 1 tablet (50 mg total) by mouth 2 (two) times daily.  . nitroGLYCERIN (NITROSTAT) 0.4 MG SL tablet Place 1 tablet (0.4 mg total) under the tongue every 5 (five) minutes as needed for chest pain.  . pantoprazole (PROTONIX) 20 MG tablet Take 1 tablet (20 mg total) by mouth 2 (two) times daily.  . traMADol (ULTRAM) 50 MG tablet Take 1 tablet (50 mg total) by mouth 3 (three) times daily as needed for moderate pain.     Allergies  Allergen Reactions  . Brilinta [Ticagrelor] Shortness Of Breath and Other (See Comments)    VERTIGO  . Influenza Vaccines Other (See Comments)    UNSPECIFIED REACTION Pt states he has been hospitalized both times he was given flu vaccine as a younger adult while in the [employer] and they told him not to take it again  . Lipitor [Atorvastatin] Other (See Comments)    MYALGIAS, JOINT PAIN  . Fluoride Preparations     Social History   Socioeconomic History  . Marital status: Married    Spouse name: Not on file  . Number of children: 3  . Years of education: ASSOCIATES  . Highest education level: Not on file  Occupational History  . Occupation: Retired  Scientific laboratory technician  . Financial resource strain: Not on file  . Food insecurity:    Worry: Not on file    Inability: Not  on file  . Transportation needs:    Medical: Not on file    Non-medical: Not on file  Tobacco Use  . Smoking status: Never Smoker  . Smokeless tobacco: Never Used  Substance and Sexual Activity  . Alcohol use: No    Alcohol/week: 0.0 standard drinks  . Drug use: No  . Sexual activity: Not Currently  Lifestyle  . Physical activity:    Days per week: Not on file    Minutes per session: Not on file  . Stress: Not on file  Relationships  . Social connections:    Talks on phone: Not on file    Gets together: Not on file    Attends religious service: Not on file    Active member of club or organization: Not on file    Attends meetings of clubs or organizations: Not on file    Relationship status: Not on file  . Intimate partner violence:    Fear of current or ex partner: Not on file    Emotionally abused: Not on file    Physically abused: Not on file    Forced sexual activity: Not on file  Other Topics Concern  . Not on file  Social History Narrative   Patient is married with 3 children.   Patient is right handed.   Patient has his Associates degree.   Patient drinks 2-3 cups daily.     Review of Systems: General: negative for chills, fever, night sweats or weight changes.  Cardiovascular: negative for chest pain, dyspnea on exertion, edema, orthopnea, palpitations, paroxysmal nocturnal dyspnea or shortness of breath Dermatological: negative for rash Respiratory: negative for cough or wheezing Urologic: negative for hematuria Abdominal: negative for nausea, vomiting, diarrhea, bright red blood per rectum, melena, or hematemesis Neurologic: negative for visual changes, syncope, or dizziness All other systems reviewed and are otherwise negative except as noted above.    Blood pressure (!) 170/80, pulse (!) 57, height 5\' 9"  (1.753 m), weight 184 lb 6.4 oz (83.6 kg).  General appearance: alert and no distress Neck: no adenopathy, no carotid bruit, no JVD, supple,  symmetrical, trachea midline and thyroid not enlarged, symmetric, no tenderness/mass/nodules Lungs: clear to auscultation bilaterally Heart: regular rate and rhythm, S1, S2 normal, no murmur, click, rub or gallop Extremities: extremities normal, atraumatic, no cyanosis or edema Pulses: 2+ and symmetric Skin: Skin color, texture, turgor normal. No rashes or lesions Neurologic: Alert and oriented X 3, normal strength and tone. Normal symmetric reflexes. Normal coordination and gait  EKG sinus bradycardia 57 with Q waves across his precordium, left anterior fascicular block with QRS widening consistent with a nonspecific IVCD.  I personally reviewed this EKG.  ASSESSMENT AND PLAN:   Hyperlipidemia History of dyslipidemia not on statin therapy with lipid profile performed 12/24/2017 revealing total cholesterol 136, LDL 68 and HDL 30.  Essential hypertension, benign History of essential hypertension her blood pressure measured today at 170/80.  He is on amlodipine and metoprolol.  Occlusion and stenosis of carotid artery with cerebral infarction History of carotid artery disease status post uncomplicated right carotid endarterectomy by Dr. Trula Slade 11/27/2015.  Dopplers performed a year ago revealed this to be widely patent.  We will repeat carotid Doppler studies.  STEMI (ST elevation myocardial infarction) History of CAD status post anterior STEMI 07/02/2013 treated with urgent cath, aspiration thrombectomy, PCI and stenting of his proximal LAD by myself via the femoral approach.  He has had no recurrent symptoms.  Peripheral vascular disease ([facility]) History of PAD status post staged bilateral common iliac artery orbital atherectomy and stenting with improvement in his claudication.  Dopplers performed 06/17/2017 revealed his stents to be widely patent.  We will recheck aortoiliac and lower extremity arterial Doppler studies.      Lorretta Harp MD FACP,FACC,FAHA, [facility] 06/03/2018 9:55 AM

## 2018-06-03 NOTE — Patient Instructions (Addendum)
Medication Instructions:  Your physician recommends that you continue on your current medications as directed. Please refer to the Current Medication list given to you today.  If you need a refill on your cardiac medications before your next appointment, please call your pharmacy.   Lab work: NONE If you have labs (blood work) drawn today and your tests are completely normal, you will receive your results only by: [name] MyChart Message (if you have MyChart) OR . A paper copy in the mail If you have any lab test that is abnormal or we need to change your treatment, we will call you to review the results.  Testing/Procedures: Your physician has requested that you have an aorta/iliac duplex. During this test, an ultrasound is used to evaluate blood flow to the aorta and iliac arteries. Allow one hour for this exam. Do not eat after midnight the day before and avoid carbonated beverages. ALREADY SCHEDULED FOR 06/20/2018  Your physician has requested that you have an ankle brachial index (ABI). During this test an ultrasound and blood pressure cuff are used to evaluate the arteries that supply the arms and legs with blood. Allow thirty minutes for this exam. There are no restrictions or special instructions. ALREADY SCHEDULED FOR 06/20/2018  Your physician has requested that you have a carotid duplex. This test is an ultrasound of the carotid arteries in your neck. It looks at blood flow through these arteries that supply the brain with blood. Allow one hour for this exam. There are no restrictions or special instructions. ALREADY SCHEDULED FOR 06/20/2018   Follow-Up: At [facility], you and your health needs are our priority.  As part of our continuing mission to provide you with exceptional heart care, we have created designated Provider Care Teams.  These Care Teams include your primary Cardiologist (physician) and Advanced Practice Providers (APPs -  Physician Assistants and Nurse Practitioners)  who all work together to provide you with the care you need, when you need it. . You will need a follow up appointment in 12 months.  Please call our office 2 months in advance to schedule this appointment.  You may see [name] or one of the following Advanced Practice Providers on your designated Care Team:   . Kerin Ransom, [place] . Almyra Deforest, PA-C . Fabian Sharp, PA-C . Jory Sims, DNP . Rosaria Ferries, PA-C . Roby Lofts, PA-C . Sande Rives, PA-C

## 2018-06-03 NOTE — Assessment & Plan Note (Signed)
History of CAD status post anterior STEMI 07/02/2013 treated with urgent cath, aspiration thrombectomy, PCI and stenting of his proximal LAD by myself via the femoral approach.  He has had no recurrent symptoms.

## 2018-06-03 NOTE — Assessment & Plan Note (Signed)
History of essential hypertension her blood pressure measured today at 170/80.  He is on amlodipine and metoprolol.

## 2018-06-03 NOTE — Assessment & Plan Note (Signed)
History of PAD status post staged bilateral common iliac artery orbital atherectomy and stenting with improvement in his claudication.  Dopplers performed 06/17/2017 revealed his stents to be widely patent.  We will recheck aortoiliac and lower extremity arterial Doppler studies.

## 2018-06-03 NOTE — Assessment & Plan Note (Signed)
History of carotid artery disease status post uncomplicated right carotid endarterectomy by Dr. Trula Slade 11/27/2015.  Dopplers performed a year ago revealed this to be widely patent.  We will repeat carotid Doppler studies.

## 2018-06-03 NOTE — Assessment & Plan Note (Signed)
History of dyslipidemia not on statin therapy with lipid profile performed 12/24/2017 revealing total cholesterol 136, LDL 68 and HDL 30.

## 2018-06-06 ENCOUNTER — Telehealth: Payer: Self-pay

## 2018-06-06 ENCOUNTER — Other Ambulatory Visit: Payer: Self-pay

## 2018-06-06 MED ORDER — TRAMADOL HCL 50 MG PO TABS: 50.0000 mg | ORAL_TABLET | Freq: Three times a day (TID) | ORAL | 4 refills | Status: DC | PRN

## 2018-06-06 NOTE — Telephone Encounter (Signed)
May have this +4 refills 

## 2018-06-09 ENCOUNTER — Other Ambulatory Visit: Payer: Self-pay | Admitting: *Deleted

## 2018-06-09 NOTE — Telephone Encounter (Signed)
[facility] calling because tramadol was faxed to pharm but they are unable to take this med faxed and needs to be escribed. It was sent on 06/06/18. Med pended and ready for dr scott to sign since it was already approved on 3/9.

## 2018-06-11 MED ORDER — TRAMADOL HCL 50 MG PO TABS: 50.0000 mg | ORAL_TABLET | Freq: Three times a day (TID) | ORAL | 4 refills | Status: DC | PRN

## 2018-06-20 ENCOUNTER — Ambulatory Visit (HOSPITAL_COMMUNITY): Payer: PPO

## 2018-07-21 ENCOUNTER — Telehealth: Payer: Self-pay | Admitting: Family Medicine

## 2018-07-21 ENCOUNTER — Ambulatory Visit: Payer: PPO | Admitting: Family Medicine

## 2018-07-21 NOTE — Telephone Encounter (Signed)
Patient came by today thinking he had appointment with you I explained that the front desk person sent out letters to reschedule.I explained to him that doctors are doing video and phone visit  For safety of our patients and staff. He doesn't want to do either he wants office visit. He states his specialist is seeing patient why cant we. Please Advise. It for his 84mth followup

## 2018-07-21 NOTE — Telephone Encounter (Signed)
It would be fine to set him up for an office visit in the morning-this can be at a morning visit in the near future

## 2018-08-01 NOTE — Telephone Encounter (Signed)
Patient scheduled.

## 2018-08-05 ENCOUNTER — Encounter: Payer: Self-pay | Admitting: Family Medicine

## 2018-08-05 ENCOUNTER — Other Ambulatory Visit: Payer: Self-pay

## 2018-08-05 ENCOUNTER — Ambulatory Visit (INDEPENDENT_AMBULATORY_CARE_PROVIDER_SITE_OTHER): Payer: PPO | Admitting: Family Medicine

## 2018-08-05 VITALS — BP 136/86 | Temp 98.2°F | Wt 182.0 lb

## 2018-08-05 DIAGNOSIS — M25562 Pain in left knee: Secondary | ICD-10-CM | POA: Diagnosis not present

## 2018-08-05 DIAGNOSIS — M25561 Pain in right knee: Secondary | ICD-10-CM

## 2018-08-05 DIAGNOSIS — D696 Thrombocytopenia, unspecified: Secondary | ICD-10-CM

## 2018-08-05 DIAGNOSIS — I10 Essential (primary) hypertension: Secondary | ICD-10-CM

## 2018-08-05 MED ORDER — PANTOPRAZOLE SODIUM 20 MG PO TBEC: 20.0000 mg | DELAYED_RELEASE_TABLET | Freq: Two times a day (BID) | ORAL | 1 refills | Status: DC

## 2018-08-05 MED ORDER — METOPROLOL TARTRATE 50 MG PO TABS: 50.0000 mg | ORAL_TABLET | Freq: Two times a day (BID) | ORAL | 1 refills | Status: DC

## 2018-08-05 MED ORDER — CLOPIDOGREL BISULFATE 75 MG PO TABS: 75.0000 mg | ORAL_TABLET | Freq: Every day | ORAL | 1 refills | Status: DC

## 2018-08-05 MED ORDER — AMLODIPINE BESYLATE 10 MG PO TABS: 10.0000 mg | ORAL_TABLET | Freq: Every day | ORAL | 1 refills | Status: DC

## 2018-08-05 NOTE — Progress Notes (Addendum)
Subjective:    Patient ID: [name] [name], male    DOB: [birth year], 84 y.o.   MRN: [id number] Patient was seen in person in the office Hypertension  This is a chronic problem. Pertinent negatives include no chest pain, headaches or shortness of breath. There are no compliance problems.   Patient for blood pressure check up.  The patient does have hypertension.  The patient is on medication.  Patient relates compliance with meds. Todays BP reviewed with the patient. Patient denies issues with medication. Patient relates reasonable diet. Patient tries to minimize salt. Patient aware of BP goals.  Patient here for follow-up regarding cholesterol.  The patient does have hyperlipidemia.  Patient does try to maintain a reasonable diet.  Patient does take the medication on a regular basis.  Denies missing a dose.  The patient denies any obvious side effects.  Prior blood work results reviewed with the patient.  The patient is aware of his cholesterol goals and the need to keep it under good control to lessen the risk of disease.   Pt here today for 4 month follow up. Pt states he is doing fairly well except for the aches and pains.    Review of Systems  Constitutional: Negative for activity change, fatigue and fever.  HENT: Negative for congestion and rhinorrhea.   Respiratory: Negative for cough and shortness of breath.   Cardiovascular: Negative for chest pain and leg swelling.  Gastrointestinal: Negative for abdominal pain, diarrhea and nausea.  Genitourinary: Negative for dysuria and hematuria.  Neurological: Negative for weakness and headaches.  Psychiatric/Behavioral: Negative for agitation and behavioral problems.       Objective:   Physical Exam Vitals signs reviewed.  Constitutional:      General: He is not in acute distress. HENT:     Head: Normocephalic and atraumatic.  Eyes:     General:        Right eye: No discharge.        Left eye: No discharge.  Neck:   Trachea: No tracheal deviation.  Cardiovascular:     Rate and Rhythm: Normal rate and regular rhythm.     Heart sounds: Normal heart sounds. No murmur.  Pulmonary:     Effort: Pulmonary effort is normal. No respiratory distress.     Breath sounds: Normal breath sounds.  Lymphadenopathy:     Cervical: No cervical adenopathy.  Skin:    General: Skin is warm and dry.  Neurological:     Mental Status: He is alert.     Coordination: Coordination normal.  Psychiatric:        Behavior: Behavior normal.     15 minutes was spent with patient today discussing healthcare issues which they came.  More than 50% of this visit-total duration of visit-was spent in counseling and coordination of care.  Please see diagnosis regarding the focus of this coordination and care       Assessment & Plan:  HTN- Patient was seen today as part of a visit regarding hypertension. The importance of healthy diet and regular physical activity was discussed. The importance of compliance with medications discussed.  Ideal goal is to keep blood pressure low elevated levels certainly below 659/93 when possible.  The patient was counseled that keeping blood pressure under control lessen his risk of complications.  The importance of regular follow-ups was discussed with the patient.  Low-salt diet such as DASH recommended.  Regular physical activity was recommended as well.  Patient was advised to  keep regular follow-ups.  Patient with history of thrombocytopenia looked at his last CBC in January his numbers are stable platelets are low but stable continue current measures at cardiology asthma  Continue PPI to protect his stomach against aspirin and Plavix that cardiology has him on  Patient does not want to be on a statin.  He had side effects with statins.  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  Does not abuse it

## 2018-08-11 ENCOUNTER — Encounter (HOSPITAL_COMMUNITY): Payer: PPO

## 2018-08-18 ENCOUNTER — Telehealth: Payer: Self-pay

## 2018-08-18 NOTE — Telephone Encounter (Signed)
   Primary Cardiologist: Quay Burow, MD  Chart reviewed as part of pre-operative protocol coverage. Simple dental extractions are considered low risk procedures per guidelines and generally do not require any specific cardiac clearance. It is also generally accepted that for simple extractions and dental cleanings, there is no need to interrupt blood thinner therapy.   SBE prophylaxis is not required for the patient.  I will route this recommendation to the requesting party via Epic fax function and remove from pre-op pool.  Please call with questions.  Abigail Butts, PA-C 08/18/2018, 4:10 PM

## 2018-08-18 NOTE — Telephone Encounter (Signed)
   [facility] Pre-operative Risk Assessment    Request for surgical clearance:  1. What type of surgery is being performed? Dental Treatment (cleaning, radiographs, filling, extractions)   2. When is this surgery scheduled? TBD   3. What type of clearance is required (medical clearance vs. Pharmacy clearance to hold med vs. Both)? Pharmacy  4. Are there any medications that need to be held prior to surgery and how long?Plavix   5. Practice name and name of physician performing surgery? Dr. Thornton Dales   6. What is your office phone number [phone]    7.   What is your office fax number [phone]  8.   Anesthesia type (None, local, MAC, general) ? Local anesthetic    Meryl Crutch 08/18/2018, 3:29 PM  _________________________________________________________________   (provider comments below)

## 2018-08-25 NOTE — Telephone Encounter (Signed)
Spoke with rep from Dr. Thornton Dales office. She report they never received clearance fax. Informed rep nurse will send a fax manually to [phone].

## 2018-09-26 ENCOUNTER — Other Ambulatory Visit (HOSPITAL_COMMUNITY): Payer: Self-pay | Admitting: Cardiovascular Disease

## 2018-09-26 DIAGNOSIS — Z95828 Presence of other vascular implants and grafts: Secondary | ICD-10-CM

## 2018-09-26 DIAGNOSIS — I6522 Occlusion and stenosis of left carotid artery: Secondary | ICD-10-CM

## 2018-09-26 DIAGNOSIS — I739 Peripheral vascular disease, unspecified: Secondary | ICD-10-CM

## 2018-10-03 ENCOUNTER — Ambulatory Visit (HOSPITAL_BASED_OUTPATIENT_CLINIC_OR_DEPARTMENT_OTHER)
Admission: RE | Admit: 2018-10-03 | Discharge: 2018-10-03 | Disposition: A | Discharge status: Home / Self Care | Payer: PPO | Source: Ambulatory Visit | Attending: Cardiovascular Disease | Admitting: Cardiovascular Disease

## 2018-10-03 ENCOUNTER — Ambulatory Visit (HOSPITAL_COMMUNITY)
Admission: RE | Admit: 2018-10-03 | Discharge: 2018-10-03 | Disposition: A | Discharge status: Home / Self Care | Payer: PPO | Source: Ambulatory Visit | Attending: Cardiology | Admitting: Cardiology

## 2018-10-03 ENCOUNTER — Other Ambulatory Visit: Payer: Self-pay

## 2018-10-03 ENCOUNTER — Other Ambulatory Visit (HOSPITAL_COMMUNITY): Payer: Self-pay | Admitting: Cardiovascular Disease

## 2018-10-03 DIAGNOSIS — Z95828 Presence of other vascular implants and grafts: Secondary | ICD-10-CM

## 2018-10-03 DIAGNOSIS — I739 Peripheral vascular disease, unspecified: Secondary | ICD-10-CM | POA: Insufficient documentation

## 2018-10-03 DIAGNOSIS — I6522 Occlusion and stenosis of left carotid artery: Secondary | ICD-10-CM | POA: Insufficient documentation

## 2018-10-03 NOTE — Progress Notes (Signed)
Notes recorded by Lorretta Harp, MD on 10/03/2018 at 12:02 PM EDT  No change from prior study. Repeat in 12 months.  Occlusion of left carotid artery

## 2018-10-03 NOTE — Progress Notes (Signed)
Notes recorded by Lorretta Harp, MD on 10/03/2018 at 12:02 PM EDT  No change from prior study. Repeat in 12 months.  Peripheral vascular disease, PTA and stenting of the bilateral common iliac arteries follow-up.

## 2018-10-17 ENCOUNTER — Telehealth: Payer: Self-pay | Admitting: Family Medicine

## 2018-10-17 DIAGNOSIS — I10 Essential (primary) hypertension: Secondary | ICD-10-CM | POA: Diagnosis not present

## 2018-10-17 DIAGNOSIS — M25562 Pain in left knee: Secondary | ICD-10-CM | POA: Diagnosis not present

## 2018-10-17 DIAGNOSIS — D696 Thrombocytopenia, unspecified: Secondary | ICD-10-CM | POA: Diagnosis not present

## 2018-10-17 DIAGNOSIS — M25561 Pain in right knee: Secondary | ICD-10-CM | POA: Diagnosis not present

## 2018-10-17 NOTE — Telephone Encounter (Signed)
Please advise. Thank you

## 2018-10-17 NOTE — Telephone Encounter (Signed)
As long as they are not having symptoms of COVID he and his wife may come for an office visit on Wednesday in office

## 2018-10-17 NOTE — Telephone Encounter (Signed)
Called to change pt's visit to a phone/video visit for Wednesday 10/19/2018, wife states pt thought he was coming in to the office, tried to explain we're trying to reduce the number of patients coming in but we'd be glad to see him in the office & tried to explain the "no visitor" guidelines, she quickly states she always comes with him  Please advise - "in office" visit & if "ok" for wife to be present & call pt to notify

## 2018-10-18 LAB — BASIC METABOLIC PANEL
BUN/Creatinine Ratio: 18 (ref 10–24)
BUN: 17 mg/dL (ref 8–27)
CO2: 24 mmol/L (ref 20–29)
Calcium: 8.7 mg/dL (ref 8.6–10.2)
Chloride: 102 mmol/L (ref 96–106)
Creatinine, Ser: 0.97 mg/dL (ref 0.76–1.27)
GFR calc Af Amer: 84 mL/min/{1.73_m2} (ref >59)
GFR calc non Af Amer: 73 mL/min/{1.73_m2} (ref >59)
Glucose: 100 mg/dL — ABNORMAL HIGH (ref 65–99)
Potassium: 4.4 mmol/L (ref 3.5–5.2)
Sodium: 141 mmol/L (ref 134–144)

## 2018-10-18 LAB — HEPATIC FUNCTION PANEL
ALT: 10 IU/L (ref 0–44)
AST: 12 IU/L (ref 0–40)
Albumin: 4.3 g/dL (ref 3.6–4.6)
Alkaline Phosphatase: 66 IU/L (ref 39–117)
Bilirubin Total: 0.5 mg/dL (ref 0.0–1.2)
Bilirubin, Direct: 0.14 mg/dL (ref 0.00–0.40)
Total Protein: 6.4 g/dL (ref 6.0–8.5)

## 2018-10-18 LAB — LIPID PANEL
Chol/HDL Ratio: 5.1 ratio — ABNORMAL HIGH (ref 0.0–5.0)
Cholesterol, Total: 142 mg/dL (ref 100–199)
HDL: 28 mg/dL — ABNORMAL LOW (ref >39)
LDL Calculated: 85 mg/dL (ref 0–99)
Triglycerides: 147 mg/dL (ref 0–149)
VLDL Cholesterol Cal: 29 mg/dL (ref 5–40)

## 2018-10-18 NOTE — Telephone Encounter (Signed)
Libby did prescreen

## 2018-10-19 ENCOUNTER — Encounter: Payer: Self-pay | Admitting: Family Medicine

## 2018-10-19 ENCOUNTER — Other Ambulatory Visit: Payer: Self-pay

## 2018-10-19 ENCOUNTER — Ambulatory Visit (INDEPENDENT_AMBULATORY_CARE_PROVIDER_SITE_OTHER): Payer: PPO | Admitting: Family Medicine

## 2018-10-19 VITALS — BP 138/70 | Temp 97.7°F | Wt 178.4 lb

## 2018-10-19 DIAGNOSIS — E782 Mixed hyperlipidemia: Secondary | ICD-10-CM

## 2018-10-19 DIAGNOSIS — I10 Essential (primary) hypertension: Secondary | ICD-10-CM | POA: Diagnosis not present

## 2018-10-19 DIAGNOSIS — I739 Peripheral vascular disease, unspecified: Secondary | ICD-10-CM

## 2018-10-19 MED ORDER — TRAMADOL HCL 50 MG PO TABS: 50.0000 mg | ORAL_TABLET | Freq: Three times a day (TID) | ORAL | 4 refills | Status: DC | PRN

## 2018-10-19 NOTE — Progress Notes (Signed)
Subjective:    Patient ID: [name] [name], male    DOB: 1935/02/18, 84 y.o.   MRN: [id number]  Hypertension This is a chronic problem. The current episode started more than 1 year ago. Pertinent negatives include no chest pain, headaches or shortness of breath. Risk factors for coronary artery disease include male gender. Treatments tried: lopressor, norvasc. There are no compliance problems.    Patient has underlying heart disease peripheral vascular disease and blood pressure issues takes his medicine on a regular basis   Review of Systems  Constitutional: Negative for diaphoresis and fatigue.  HENT: Negative for congestion and rhinorrhea.   Respiratory: Negative for cough and shortness of breath.   Cardiovascular: Negative for chest pain and leg swelling.  Gastrointestinal: Negative for abdominal pain and diarrhea.  Skin: Negative for color change and rash.  Neurological: Negative for dizziness and headaches.  Psychiatric/Behavioral: Negative for behavioral problems and confusion.       Objective:   Physical Exam Vitals signs reviewed.  Constitutional:      General: He is not in acute distress. HENT:     Head: Normocephalic and atraumatic.  Eyes:     General:        Right eye: No discharge.        Left eye: No discharge.  Neck:     Trachea: No tracheal deviation.  Cardiovascular:     Rate and Rhythm: Normal rate and regular rhythm.     Heart sounds: Normal heart sounds. No murmur.  Pulmonary:     Effort: Pulmonary effort is normal. No respiratory distress.     Breath sounds: Normal breath sounds. No wheezing.  Lymphadenopathy:     Cervical: No cervical adenopathy.  Skin:    General: Skin is warm.     Findings: No rash.  Neurological:     Mental Status: He is alert.  Psychiatric:        Behavior: Behavior normal.     Results for orders placed or performed in visit on 08/05/18  Lipid Profile  Result Value Ref Range   Cholesterol, Total 142 100 - 199  mg/dL   Triglycerides 147 0 - 149 mg/dL   HDL 28 (L) >39 mg/dL   VLDL Cholesterol Cal 29 5 - 40 mg/dL   LDL Calculated 85 0 - 99 mg/dL   Chol/HDL Ratio 5.1 (H) 0.0 - 5.0 ratio  Hepatic function panel  Result Value Ref Range   Total Protein 6.4 6.0 - 8.5 g/dL   Albumin 4.3 3.6 - 4.6 g/dL   Bilirubin Total 0.5 0.0 - 1.2 mg/dL   Bilirubin, Direct 0.14 0.00 - 0.40 mg/dL   Alkaline Phosphatase 66 39 - 117 IU/L   AST 12 0 - 40 IU/L   ALT 10 0 - 44 IU/L  Basic Metabolic Panel (BMET)  Result Value Ref Range   Glucose 100 (H) 65 - 99 mg/dL   BUN 17 8 - 27 mg/dL   Creatinine, Ser 0.97 0.76 - 1.27 mg/dL   GFR calc non Af Amer 73 >59 mL/min/1.73   GFR calc Af Amer 84 >59 mL/min/1.73   BUN/Creatinine Ratio 18 10 - 24   Sodium 141 134 - 144 mmol/L   Potassium 4.4 3.5 - 5.2 mmol/L   Chloride 102 96 - 106 mmol/L   CO2 24 20 - 29 mmol/L   Calcium 8.7 8.6 - 10.2 mg/dL         Assessment & Plan:  Cholesterol profile overall looks very good  Peripheral vascular disease stable  Blood pressure very good today  Fasting hyperglycemia very minimal watch diet closely stay active  Follow-up in 3 months follow-up sooner problems

## 2018-11-08 ENCOUNTER — Other Ambulatory Visit: Payer: Self-pay

## 2018-11-08 ENCOUNTER — Telehealth: Payer: Self-pay | Admitting: Family Medicine

## 2018-11-08 MED ORDER — PANTOPRAZOLE SODIUM 20 MG PO TBEC: 20.0000 mg | DELAYED_RELEASE_TABLET | Freq: Two times a day (BID) | ORAL | 1 refills | Status: DC

## 2018-11-08 MED ORDER — AMLODIPINE BESYLATE 10 MG PO TABS: 10.0000 mg | ORAL_TABLET | Freq: Every day | ORAL | 1 refills | Status: DC

## 2018-11-08 MED ORDER — METOPROLOL TARTRATE 50 MG PO TABS: 50.0000 mg | ORAL_TABLET | Freq: Two times a day (BID) | ORAL | 1 refills | Status: DC

## 2018-11-08 MED ORDER — CLOPIDOGREL BISULFATE 75 MG PO TABS: 75.0000 mg | ORAL_TABLET | Freq: Every day | ORAL | 1 refills | Status: DC

## 2018-11-08 NOTE — Telephone Encounter (Signed)
Patient was seen on 7/22 and was told all medication were sent in but mail order pharmacy called patient and stated they didn't get any thing can you resend all prescriptions please.

## 2018-11-08 NOTE — Telephone Encounter (Signed)
Wife states that pharmacy has changed name and each time the mail order changes names, she has issues with getting their med. Informed patient wife that we had sent in 6 months worth back in June. Informed patient that we would send the meds in again and if any issues to give [place] a call. Pt wife states she is thinking to a Management consultant

## 2019-01-19 ENCOUNTER — Ambulatory Visit: Payer: PPO | Admitting: Family Medicine

## 2019-01-20 ENCOUNTER — Encounter: Payer: Self-pay | Admitting: Family Medicine

## 2019-01-20 ENCOUNTER — Ambulatory Visit (INDEPENDENT_AMBULATORY_CARE_PROVIDER_SITE_OTHER): Payer: PPO | Admitting: Family Medicine

## 2019-01-20 ENCOUNTER — Other Ambulatory Visit: Payer: Self-pay

## 2019-01-20 VITALS — BP 138/90 | Temp 97.4°F | Wt 174.4 lb

## 2019-01-20 DIAGNOSIS — I10 Essential (primary) hypertension: Secondary | ICD-10-CM

## 2019-01-20 NOTE — Progress Notes (Signed)
   Subjective:    Patient ID: [name] [name], male    DOB: [birth year], 84 y.o.   MRN: [id number]  Hypertension This is a chronic problem. Pertinent negatives include no chest pain, headaches or shortness of breath. There are no compliance problems.   Pt is taking Amlodipine 10 mg and Metoprolol 50 mg BID. Patient denies any stroke related symptoms.  His best he knows blood pressure under decent control Patient denies any chest tightness pressure pain shortness of breath  Pt states he has having some joint aches/pain.  Has significant amount of aches and discomforts of joint pains worse with certain movements Review of Systems  Constitutional: Negative for diaphoresis and fatigue.  HENT: Negative for congestion and rhinorrhea.   Respiratory: Negative for cough and shortness of breath.   Cardiovascular: Negative for chest pain and leg swelling.  Gastrointestinal: Negative for abdominal pain and diarrhea.  Skin: Negative for color change and rash.  Neurological: Negative for dizziness and headaches.  Psychiatric/Behavioral: Negative for behavioral problems and confusion.       Objective:   Physical Exam Vitals signs reviewed.  Constitutional:      General: He is not in acute distress. HENT:     Head: Normocephalic and atraumatic.  Eyes:     General:        Right eye: No discharge.        Left eye: No discharge.  Neck:     Trachea: No tracheal deviation.  Cardiovascular:     Rate and Rhythm: Normal rate.     Heart sounds: Normal heart sounds. No murmur.  Pulmonary:     Effort: Pulmonary effort is normal. No respiratory distress.     Breath sounds: Normal breath sounds.  Lymphadenopathy:     Cervical: No cervical adenopathy.  Skin:    General: Skin is warm and dry.  Neurological:     Mental Status: He is alert.     Coordination: Coordination normal.  Psychiatric:        Behavior: Behavior normal.           Assessment & Plan:  Arthritic changes of getting  older recommend for the patient to move forward with using tramadol as needed also told the patient there is really no benefit to doing additional lab work for this.  Patient defers on any flu shot.  Does not want to take this  Blood pressure overall decent.  Stick with current measures.  Increasing blood pressure medicine only increases her risk of falling he does have some mild orthostatic changes when he stands blood pressure drops but he does not get dizzy does not drop into a dangerous level  Follow-up in the spring lab work at that time

## 2019-01-25 ENCOUNTER — Other Ambulatory Visit: Payer: Self-pay | Admitting: Family Medicine

## 2019-01-25 ENCOUNTER — Telehealth: Payer: Self-pay | Admitting: Family Medicine

## 2019-01-25 MED ORDER — TRAMADOL HCL 50 MG PO TABS: 50.0000 mg | ORAL_TABLET | Freq: Three times a day (TID) | ORAL | 1 refills | Status: DC | PRN

## 2019-01-25 NOTE — Telephone Encounter (Signed)
Telephone call no answer 

## 2019-01-25 NOTE — Telephone Encounter (Signed)
Prescription was sent to [facility]

## 2019-01-25 NOTE — Telephone Encounter (Signed)
Pt's mail order pharmacy can't get him his traMADol (ULTRAM) 50 MG tablet before he runs out   The mail order pharmacy told him to get an emergency 30 day supply sent to a local pharmacy  Pt would like an Rx sent to [facility]   Please advise & call pt when done

## 2019-01-26 NOTE — Telephone Encounter (Signed)
Telephone call no answer 

## 2019-01-26 NOTE — Telephone Encounter (Signed)
Patient notified

## 2019-02-21 ENCOUNTER — Other Ambulatory Visit: Payer: Self-pay

## 2019-04-10 ENCOUNTER — Other Ambulatory Visit: Payer: Self-pay | Admitting: *Deleted

## 2019-04-10 ENCOUNTER — Telehealth: Payer: Self-pay | Admitting: Family Medicine

## 2019-04-10 MED ORDER — TRAMADOL HCL 50 MG PO TABS: 50.0000 mg | ORAL_TABLET | Freq: Three times a day (TID) | ORAL | 1 refills | Status: DC | PRN

## 2019-04-10 NOTE — Telephone Encounter (Signed)
Script printed. Await signature and then will call pt after its faxed

## 2019-04-10 NOTE — Telephone Encounter (Signed)
Last med check 10/20

## 2019-04-10 NOTE — Telephone Encounter (Signed)
Patient is requesting refill on tramadol 50 mg to be called into [facility] . He states has been out for three days now was waiting on mail order to come in. Please advise

## 2019-04-10 NOTE — Telephone Encounter (Signed)
Go ahead with 30-day with 1 additional refill

## 2019-04-10 NOTE — Telephone Encounter (Signed)
Med sent to pharm and pt notified.  

## 2019-04-11 ENCOUNTER — Other Ambulatory Visit: Payer: Self-pay

## 2019-04-11 ENCOUNTER — Ambulatory Visit (INDEPENDENT_AMBULATORY_CARE_PROVIDER_SITE_OTHER): Payer: PPO | Admitting: Family Medicine

## 2019-04-11 DIAGNOSIS — N3000 Acute cystitis without hematuria: Secondary | ICD-10-CM | POA: Diagnosis not present

## 2019-04-11 DIAGNOSIS — R350 Frequency of micturition: Secondary | ICD-10-CM

## 2019-04-11 DIAGNOSIS — N401 Enlarged prostate with lower urinary tract symptoms: Secondary | ICD-10-CM | POA: Diagnosis not present

## 2019-04-11 MED ORDER — CEPHALEXIN 500 MG PO CAPS: 500.0000 mg | ORAL_CAPSULE | Freq: Four times a day (QID) | ORAL | 0 refills | Status: DC

## 2019-04-11 NOTE — Progress Notes (Signed)
   Subjective:    Patient ID: [name] [name], male    DOB: Aug 13, 1934, 85 y.o.   MRN: [id number]  HPI Pt is having bladder infection. Pt is having burning while urinating and itching. Pt states he has some odor also. Pt states this has been going on for about 3 weeks. Pt has been taking pain pills to help.  Patient states he has had odor some burning with urination denies severe abdominal pain denies flank pain denies nausea vomiting diarrhea PMH BPH with decreased flow with increased frequency also has history of heart disease denies any heart issues currently Virtual Visit via Telephone Note  I connected with [name] [name] on 04/11/19 at  2:00 PM EST by telephone and verified that I am speaking with the correct person using two identifiers.  Location: Patient: home Provider: office   I discussed the limitations, risks, security and privacy concerns of performing an evaluation and management service by telephone and the availability of in person appointments. I also discussed with the patient that there may be a patient responsible charge related to this service. The patient expressed understanding and agreed to proceed.   History of Present Illness:    Observations/Objective:   Assessment and Plan:   Follow Up Instructions:    I discussed the assessment and treatment plan with the patient. The patient was provided an opportunity to ask questions and all were answered. The patient agreed with the plan and demonstrated an understanding of the instructions.   The patient was advised to call back or seek an in-person evaluation if the symptoms worsen or if the condition fails to improve as anticipated.  I provided 15 minutes of non-face-to-face time during this encounter.   Fall Risk  02/21/2019 01/20/2019 09/14/2016 02/27/2014  Falls in the past year? 0 0 Yes No  Comment Emmi Telephone Survey: data to providers prior to load - - -  Number falls in past yr: - - 2 or more -    Injury with Fall? - - No -  Risk for fall due to : - - Impaired mobility -  Follow up - Falls evaluation completed Education provided -       Review of Systems  Constitutional: Negative for diaphoresis and fatigue.  HENT: Negative for congestion and rhinorrhea.   Respiratory: Negative for cough and shortness of breath.   Cardiovascular: Negative for chest pain and leg swelling.  Gastrointestinal: Negative for abdominal pain and diarrhea.  Genitourinary: Positive for dysuria and frequency.  Skin: Negative for color change and rash.  Neurological: Negative for dizziness and headaches.  Psychiatric/Behavioral: Negative for behavioral problems and confusion.       Objective:   Physical Exam  Today's visit was via telephone Physical exam was not possible for this visit       Assessment & Plan:  Patient has history of BPH also has symptomatology of UTI we will go ahead with antibiotics.  Follow-up if progressive troubles or problems warning signs were discussed I do not feel the patient has pyelonephritis

## 2019-05-25 ENCOUNTER — Other Ambulatory Visit: Payer: Self-pay | Admitting: Family Medicine

## 2019-06-14 ENCOUNTER — Other Ambulatory Visit: Payer: Self-pay

## 2019-06-14 ENCOUNTER — Ambulatory Visit (INDEPENDENT_AMBULATORY_CARE_PROVIDER_SITE_OTHER): Payer: PPO | Admitting: Family Medicine

## 2019-06-14 DIAGNOSIS — E782 Mixed hyperlipidemia: Secondary | ICD-10-CM

## 2019-06-14 DIAGNOSIS — Z79899 Other long term (current) drug therapy: Secondary | ICD-10-CM

## 2019-06-14 DIAGNOSIS — I10 Essential (primary) hypertension: Secondary | ICD-10-CM

## 2019-06-14 MED ORDER — TRAMADOL HCL 50 MG PO TABS: ORAL_TABLET | ORAL | 4 refills | Status: DC

## 2019-06-14 NOTE — Progress Notes (Signed)
   Subjective:    Patient ID: [name] [name], male    DOB: Aug 18, 1934, 85 y.o.   MRN: [id number]  Hypertension This is a chronic problem. Pertinent negatives include no chest pain, headaches or shortness of breath. Risk factors for coronary artery disease include male gender. There are no compliance problems.   pt is taking Amlodipine 10 mg once daily and Metoprolol 50 mg BID. Pt states he does not check blood pressure often but readings have been around 140/80 here recently. Patient does occasionally get dizzy but never to the point of stumbling or falling no passing out. Virtual Visit via Telephone Note  I connected with [name] [name] on 06/14/19 at 10:00 AM EDT by telephone and verified that I am speaking with the correct person using two identifiers.  Location: Patient: home Provider: office   I discussed the limitations, risks, security and privacy concerns of performing an evaluation and management service by telephone and the availability of in person appointments. I also discussed with the patient that there may be a patient responsible charge related to this service. The patient expressed understanding and agreed to proceed.   History of Present Illness:    Observations/Objective:   Assessment and Plan:  Fall Risk  06/14/2019 04/12/2019 02/21/2019 01/20/2019 09/14/2016  Falls in the past year? 1 0 0 0 Yes  Comment - - Emmi Telephone Survey: data to providers prior to load - -  Number falls in past yr: 1 - - - 2 or more  Injury with Fall? 0 - - - No  Risk for fall due to : - - - - Impaired mobility  Follow up Falls evaluation completed;Education provided Falls evaluation completed - Falls evaluation completed Education provided    Follow Up Instructions:    I discussed the assessment and treatment plan with the patient. The patient was provided an opportunity to ask questions and all were answered. The patient agreed with the plan and demonstrated an understanding  of the instructions.   The patient was advised to call back or seek an in-person evaluation if the symptoms worsen or if the condition fails to improve as anticipated.  I provided 22 minutes of non-face-to-face time during this encounter.        Review of Systems  Constitutional: Negative for diaphoresis and fatigue.  HENT: Negative for congestion and rhinorrhea.   Respiratory: Negative for cough and shortness of breath.   Cardiovascular: Negative for chest pain and leg swelling.  Gastrointestinal: Negative for abdominal pain and diarrhea.  Skin: Negative for color change and rash.  Neurological: Negative for dizziness and headaches.  Psychiatric/Behavioral: Negative for behavioral problems and confusion.       Objective:   Physical Exam   Today's visit was via telephone Physical exam was not possible for this visit      Assessment & Plan:  1. Essential hypertension, benign Blood pressure reportedly good control  Denies any major setbacks - Lipid Profile - Hepatic function panel - Basic Metabolic Panel (BMET) - CBC with Differential  2. Mixed hyperlipidemia Patient tries to watch his diet. - Lipid Profile - Hepatic function panel - Basic Metabolic Panel (BMET) - CBC with Differential  3. High risk medication use Patient will do lab work by this mid summer follow-up in person by summer - [place] - Hepatic function panel - Basic Metabolic Panel (BMET) - CBC with Differential Patient does have osteoarthritis refills of tramadol given

## 2019-07-11 ENCOUNTER — Other Ambulatory Visit: Payer: Self-pay

## 2019-07-11 MED ORDER — AMLODIPINE BESYLATE 10 MG PO TABS: 10.0000 mg | ORAL_TABLET | Freq: Every day | ORAL | 1 refills | Status: DC

## 2019-07-13 ENCOUNTER — Ambulatory Visit (INDEPENDENT_AMBULATORY_CARE_PROVIDER_SITE_OTHER): Payer: PPO | Admitting: Family Medicine

## 2019-07-13 ENCOUNTER — Encounter: Payer: Self-pay | Admitting: Family Medicine

## 2019-07-13 ENCOUNTER — Other Ambulatory Visit: Payer: Self-pay

## 2019-07-13 VITALS — BP 146/92 | Temp 98.1°F | Wt 173.6 lb

## 2019-07-13 DIAGNOSIS — R8290 Unspecified abnormal findings in urine: Secondary | ICD-10-CM

## 2019-07-13 DIAGNOSIS — E782 Mixed hyperlipidemia: Secondary | ICD-10-CM | POA: Diagnosis not present

## 2019-07-13 DIAGNOSIS — Z79899 Other long term (current) drug therapy: Secondary | ICD-10-CM | POA: Diagnosis not present

## 2019-07-13 DIAGNOSIS — N401 Enlarged prostate with lower urinary tract symptoms: Secondary | ICD-10-CM

## 2019-07-13 DIAGNOSIS — R350 Frequency of micturition: Secondary | ICD-10-CM

## 2019-07-13 DIAGNOSIS — I10 Essential (primary) hypertension: Secondary | ICD-10-CM | POA: Diagnosis not present

## 2019-07-13 LAB — POCT URINALYSIS DIPSTICK (MANUAL)
Leukocytes, UA: NEGATIVE
Nitrite, UA: NEGATIVE
Poct Blood: NEGATIVE
Spec Grav, UA: <=1.005 — AB (ref 1.010–1.025)
pH, UA: 5 (ref 5.0–8.0)

## 2019-07-13 MED ORDER — TAMSULOSIN HCL 0.4 MG PO CAPS: 0.4000 mg | ORAL_CAPSULE | Freq: Every day | ORAL | 3 refills | Status: DC

## 2019-07-13 NOTE — Progress Notes (Signed)
   Subjective:    Patient ID: [name] [name], male    DOB: 13-Jun-1934, 85 y.o.   MRN: [id number]  HPI Patient comes in today with complaints of foul smelling urine. Patient relates foul-smelling urine denies high fever chills sweats Patient is not having any other symptoms. Patient denies vomiting diarrhea.  Denies flank pain abdominal pain.  Drink some liquids but not a lot.  States he has multiple times get up at night to urinate despite cutting back on fluids Results for orders placed or performed in visit on 07/13/19  POCT Urinalysis Dip Manual  Result Value Ref Range   Spec Grav, UA <=1.005 (A) 1.010 - 1.025   pH, UA 5.0 5.0 - 8.0   Leukocytes, UA Negative Negative   Nitrite, UA Negative Negative   Poct Protein     Poct Glucose     Poct Ketones +++ large (A) Negative   Poct Urobilinogen     Poct Bilirubin + (A) Negative   Poct Blood Negative Negative, trace    Review of Systems See above    Objective:   Physical Exam  Lungs respiratory rate normal no crackles heart regular no murmurs extremities no edema skin warm dry  Urine under the microscope concentrated but no sign of any type of secondary infection    Assessment & Plan:  I believe the patient needs to do a better job drinking liquids we will culture the urine await results In addition to this we discussed use of Flomax to try to help with urinary flow I did caution him that this medication can cause some issues with dizziness he will watch for this He will do his lab work Follow-up in a few months

## 2019-07-14 ENCOUNTER — Other Ambulatory Visit: Payer: Self-pay

## 2019-07-14 LAB — CBC WITH DIFFERENTIAL/PLATELET
Basophils Absolute: 0.1 10*3/uL (ref 0.0–0.2)
Basos: 1 %
EOS (ABSOLUTE): 0.7 10*3/uL — ABNORMAL HIGH (ref 0.0–0.4)
Eos: 7 %
Hematocrit: 50.2 % (ref 37.5–51.0)
Hemoglobin: 16.7 g/dL (ref 13.0–17.7)
Immature Grans (Abs): 0 10*3/uL (ref 0.0–0.1)
Immature Granulocytes: 0 %
Lymphocytes Absolute: 1.7 10*3/uL (ref 0.7–3.1)
Lymphs: 18 %
MCH: 28.7 pg (ref 26.6–33.0)
MCHC: 33.3 g/dL (ref 31.5–35.7)
MCV: 86 fL (ref 79–97)
Monocytes Absolute: 0.8 10*3/uL (ref 0.1–0.9)
Monocytes: 9 %
Neutrophils Absolute: 6.1 10*3/uL (ref 1.4–7.0)
Neutrophils: 65 %
Platelets: 125 10*3/uL — ABNORMAL LOW (ref 150–450)
RBC: 5.81 x10E6/uL — ABNORMAL HIGH (ref 4.14–5.80)
RDW: 14 % (ref 11.6–15.4)
WBC: 9.3 10*3/uL (ref 3.4–10.8)

## 2019-07-14 LAB — BASIC METABOLIC PANEL
BUN/Creatinine Ratio: 17 (ref 10–24)
BUN: 19 mg/dL (ref 8–27)
CO2: 23 mmol/L (ref 20–29)
Calcium: 9.4 mg/dL (ref 8.6–10.2)
Chloride: 102 mmol/L (ref 96–106)
Creatinine, Ser: 1.15 mg/dL (ref 0.76–1.27)
GFR calc Af Amer: 68 mL/min/{1.73_m2} (ref >59)
GFR calc non Af Amer: 59 mL/min/{1.73_m2} — ABNORMAL LOW (ref >59)
Glucose: 96 mg/dL (ref 65–99)
Potassium: 4.8 mmol/L (ref 3.5–5.2)
Sodium: 141 mmol/L (ref 134–144)

## 2019-07-14 LAB — HEPATIC FUNCTION PANEL
ALT: 10 IU/L (ref 0–44)
AST: 16 IU/L (ref 0–40)
Albumin: 4.5 g/dL (ref 3.6–4.6)
Alkaline Phosphatase: 82 IU/L (ref 39–117)
Bilirubin Total: 0.4 mg/dL (ref 0.0–1.2)
Bilirubin, Direct: 0.13 mg/dL (ref 0.00–0.40)
Total Protein: 6.9 g/dL (ref 6.0–8.5)

## 2019-07-14 LAB — LIPID PANEL
Chol/HDL Ratio: 5.6 ratio — ABNORMAL HIGH (ref 0.0–5.0)
Cholesterol, Total: 163 mg/dL (ref 100–199)
HDL: 29 mg/dL — ABNORMAL LOW (ref >39)
LDL Chol Calc (NIH): 95 mg/dL (ref 0–99)
Triglycerides: 230 mg/dL — ABNORMAL HIGH (ref 0–149)
VLDL Cholesterol Cal: 39 mg/dL (ref 5–40)

## 2019-07-14 MED ORDER — CLOPIDOGREL BISULFATE 75 MG PO TABS: 75.0000 mg | ORAL_TABLET | Freq: Every day | ORAL | 1 refills | Status: DC

## 2019-07-16 ENCOUNTER — Encounter: Payer: Self-pay | Admitting: Family Medicine

## 2019-07-18 ENCOUNTER — Other Ambulatory Visit: Payer: Self-pay

## 2019-07-18 ENCOUNTER — Encounter: Payer: Self-pay | Admitting: Cardiovascular Disease

## 2019-07-18 ENCOUNTER — Other Ambulatory Visit: Payer: Self-pay | Admitting: Family Medicine

## 2019-07-18 ENCOUNTER — Ambulatory Visit: Payer: PPO | Admitting: Cardiovascular Disease

## 2019-07-18 DIAGNOSIS — I2102 ST elevation (STEMI) myocardial infarction involving left anterior descending coronary artery: Secondary | ICD-10-CM | POA: Diagnosis not present

## 2019-07-18 DIAGNOSIS — I10 Essential (primary) hypertension: Secondary | ICD-10-CM | POA: Diagnosis not present

## 2019-07-18 DIAGNOSIS — I739 Peripheral vascular disease, unspecified: Secondary | ICD-10-CM

## 2019-07-18 DIAGNOSIS — I714 Abdominal aortic aneurysm, without rupture, unspecified: Secondary | ICD-10-CM

## 2019-07-18 DIAGNOSIS — E782 Mixed hyperlipidemia: Secondary | ICD-10-CM | POA: Diagnosis not present

## 2019-07-18 LAB — URINE CULTURE

## 2019-07-18 MED ORDER — CEPHALEXIN 500 MG PO CAPS: ORAL_CAPSULE | ORAL | 0 refills | Status: DC

## 2019-07-18 MED ORDER — ATORVASTATIN CALCIUM 20 MG PO TABS: 20.0000 mg | ORAL_TABLET | Freq: Every day | ORAL | 3 refills | Status: DC

## 2019-07-18 MED ORDER — LISINOPRIL 5 MG PO TABS: 5.0000 mg | ORAL_TABLET | Freq: Every day | ORAL | 3 refills | Status: DC

## 2019-07-18 MED ORDER — COQ10 200 MG PO CAPS: 200.0000 mg | ORAL_CAPSULE | Freq: Every day | ORAL | 3 refills | Status: DC

## 2019-07-18 NOTE — Progress Notes (Signed)
07/18/2019 [name] [name]   October 19, 1934  [id number]  Primary Physician Kathyrn Drown, MD Primary Cardiologist: Lorretta Harp MD FACP, [place], [place], [name]  HPI:  [name] [name] is a 85 y.o.  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 I last saw him  06/03/2018.  He is accompanied by his wife today... He was admitted on 07/02/13 for Anterior STEMI. Presenting symptoms included severe epigastric burning pain/ indigestion and left arm pain. Initial troponin was >20.0. He underwent emergent LHC and successful aspiration thrombectomy, PCI and stenting of proximal LAD by [name], via the right femoral artery . The overall LVEF was estimated at 50%, without wall motion abnormalities. He tolerated the procedure well and left the cath lab in stable condition. DAPT with ASA and Brilinta was initiated. He was also placed on a BB, ACE-I and statin. His post PCI course was w/o complication. He was discharged home on 07/05/13.  It should also be noted that at time of emergent LHC, he was found to also have significant peripheral vascular disease with an occluded left iliac and high-grade right common iliac artery stenosis, as well as a small AAA. The patient complained of bilateral LEE. [name] ordered for him to undergo bilateral LEAs. The study was performed in our office on 07/18/13. Findings are as follows: ABI 0.95 on Rt, 0.59 on Lt.the patient underwent angiography, diamondback orbital rotational arthrectomy, PTA and stenting of a highly calcified ostial right common iliac artery stenosis. He had excellent angiographic and clinical result. His Dopplers normalized to no longer has right lower extremity claudication. He wishes to proceed with attempt at percutaneous opacification of his left common iliac chronic total occlusion. He is not tolerating his Brilenta because of shortness of breath and we subsequently  transitioned to Plavix. He recently had a left brain stroke thought to be related to a watershed infarct from a hemodynamically significant right carotid stenosis in the setting of an occluded left carotid. He was evaluated by Dr. Erlinda Hong from [facility]. Dr. Erlinda Hong thought he would be a candidate for carotid stenting should he be found have a right internal carotid artery stenosis of greater than 70%.I intubated him on 05/15/14 revealing at most a 40-50% smooth right internal artery stenosis. I suspect his carotid ultrasound overestimated the degree of stenosis because the contralateral occlusion.  Since I saw him a year ago he's remained stable. He denies chest pain, shortness of breath or claudication. He does want to have a left total knee replacement. A pharmacologic Myoview stress test was intermediate risk with scar and peri-infarct ischemia. His EF is in the 45-55% range. Because of progressively worsening Doppler studies a CT scan scan was performed that showed progression of his right internal carotid artery stenosis compared to his prior CTA. He did have some right common carotid origin stenosis as well. He was neurologically asymptomatic. I did angiogram him approximately 18 months ago revealing at most 50-60% right ICA stenosis. I suspected that the Dopplers overestimated the degree of stenosis because of contralateral occlusion. Because of progression of disease he underwent angiography by myself 10/29/15 with the intent to perform right internal carotid artery stenting. Unfortunately, because of the degree of calcification and tortuosity and was never able to safely access the right common carotid artery and therefore aborted the procedure. The patient also underwent uncomplicated right carotid endarterectomy by Dr. Trula Slade 11/27/15 which he has  nicely recovered from.Recent carotid Dopplers performed 01/15/16 revealed a widely patent right carotid endarterectomy site.  Since I saw me year  ago he's remained asymptomatic. Denies chest pain, shortness of breath or claudication. His Dopplers performed in July of last year revealed patent iliac stents with normal ABIs and widely patent right carotid endarterectomy site.  He denies chest pain, shortness of breath or claudication.  His major complaint is of bilateral knee pain with ambulation.  He did have lipid profile performed 07/13/2019 revealing total cholesterol 163, LDL of 95 and HDL 29, not at goal for secondary prevention and not on statin therapy currently.   Current Meds  Medication Sig  . acetaminophen (TYLENOL) 500 MG tablet Take 500 mg by mouth every 6 (six) hours as needed for mild pain or moderate pain.   [name] amLODipine (NORVASC) 10 MG tablet Take 1 tablet (10 mg total) by mouth daily.  [name] aspirin EC 81 MG tablet Take 81 mg by mouth daily.  . clopidogrel (PLAVIX) 75 MG tablet Take 1 tablet (75 mg total) by mouth daily.  . methylcellulose (ARTIFICIAL TEARS) 1 % ophthalmic solution Place 1 drop into both eyes as needed (for dry eyes).  . metoprolol tartrate (LOPRESSOR) 50 MG tablet Take 1 tablet by mouth twice a day  . nitroGLYCERIN (NITROSTAT) 0.4 MG SL tablet Place 1 tablet (0.4 mg total) under the tongue every 5 (five) minutes as needed for chest pain.  . pantoprazole (PROTONIX) 20 MG tablet Take 1 tablet by mouth twice a day  . tamsulosin (FLOMAX) 0.4 MG CAPS capsule Take 1 capsule (0.4 mg total) by mouth daily.  . traMADol (ULTRAM) 50 MG tablet Take one tablet po TID prn     Allergies  Allergen Reactions  . Brilinta [Ticagrelor] Shortness Of Breath and Other (See Comments)    VERTIGO  . Influenza Vaccines Other (See Comments)    UNSPECIFIED REACTION Pt states he has been hospitalized both times he was given flu vaccine as a younger adult while in the [employer] and they told him not to take it again  . Lipitor [Atorvastatin] Other (See Comments)    MYALGIAS, JOINT PAIN  . Fluoride Preparations     Social History    Socioeconomic History  . Marital status: Married    Spouse name: Not on file  . Number of children: 3  . Years of education: ASSOCIATES  . Highest education level: Not on file  Occupational History  . Occupation: Retired  Tobacco Use  . Smoking status: Never Smoker  . Smokeless tobacco: Never Used  Substance and Sexual Activity  . Alcohol use: No    Alcohol/week: 0.0 standard drinks  . Drug use: No  . Sexual activity: Not Currently  Other Topics Concern  . Not on file  Social History Narrative   Patient is married with 3 children.   Patient is right handed.   Patient has his Associates degree.   Patient drinks 2-3 cups daily.   Social Determinants of Health   Financial Resource Strain:   . Difficulty of Paying Living Expenses:   Food Insecurity:   . Worried About Charity fundraiser in the Last Year:   . Arboriculturist in the Last Year:   Transportation Needs:   . Film/video editor (Medical):   [name] Lack of Transportation (Non-Medical):   Physical Activity:   . Days of Exercise per Week:   . Minutes of Exercise per Session:   Stress:   . Feeling  of Stress :   Social Connections:   . Frequency of Communication with Friends and Family:   . Frequency of Social Gatherings with Friends and Family:   . Attends Religious Services:   . Active Member of Clubs or Organizations:   . Attends Archivist Meetings:   [name] Marital Status:   Intimate Partner Violence:   . Fear of Current or Ex-Partner:   . Emotionally Abused:   [name] Physically Abused:   . Sexually Abused:      Review of Systems: General: negative for chills, fever, night sweats or weight changes.  Cardiovascular: negative for chest pain, dyspnea on exertion, edema, orthopnea, palpitations, paroxysmal nocturnal dyspnea or shortness of breath Dermatological: negative for rash Respiratory: negative for cough or wheezing Urologic: negative for hematuria Abdominal: negative for nausea, vomiting,  diarrhea, bright red blood per rectum, melena, or hematemesis Neurologic: negative for visual changes, syncope, or dizziness All other systems reviewed and are otherwise negative except as noted above.    Blood pressure (!) 198/82, pulse (!) 57, height 5\' 8"  (1.727 m), weight 174 lb (78.9 kg), SpO2 98 %.  General appearance: alert and no distress Neck: no adenopathy, no carotid bruit, no JVD, supple, symmetrical, trachea midline and thyroid not enlarged, symmetric, no tenderness/mass/nodules Lungs: clear to auscultation bilaterally Heart: regular rate and rhythm, S1, S2 normal, no murmur, click, rub or gallop Extremities: extremities normal, atraumatic, no cyanosis or edema Pulses: 2+ and symmetric Skin: Skin color, texture, turgor normal. No rashes or lesions Neurologic: Alert and oriented X 3, normal strength and tone. Normal symmetric reflexes. Normal coordination and gait  EKG sinus bradycardia at 58, septal Q waves, left anterior fascicular block and left ventricular hypertrophy with repolarization changes and QRS widening.  I personally reviewed this EKG.  ASSESSMENT AND PLAN:   Hyperlipidemia History of hyperlipidemia currently not on statin therapy with recent lipid profile performed 07/13/2019 revealing a total cholesterol of 163, LDL of 95 and HDL of 29.  I am going to begin him on atorvastatin 20 mg a day and co-Q10 200 mg a day and we will recheck a lipid liver profile in 2 months  Essential hypertension, benign History of essential hypertension a blood pressure measured today at 198/82.  His wife says that he has "whitecoat syndrome".  When his blood pressures measured at home is usually in the 150/70 range.  He is only on metoprolol and amlodipine.  I am going to add low-dose lisinopril 5 mg a day, we will recheck a basic metabolic panel in 2 weeks and have him keep a 30-day blood pressure log.  He will come back and see Kristen in 4 weeks to review and make appropriate  changes.  Occlusion and stenosis of carotid artery with cerebral infarction History of carotid artery disease status post prior stroke and uncomplicated right carotid endarterectomy performed by Dr. Trula Slade 11/27/2015.  His most recent carotid Dopplers performed 10/03/2018 revealed his endarterectomy site to be widely patent.  STEMI (ST elevation myocardial infarction) History of CAD status post anterior STEMI 07/02/2013 treated with aspiration thrombectomy, PCI drug-eluting stenting by myself via the right femoral approach.  His EF was in the 50% range.  He currently denies chest pain or shortness of breath.  He was initially on Brilinta which was transitioned to clopidogrel because of shortness of breath.  Peripheral vascular disease ([place]) History of peripheral arterial disease status post staged right and left common iliac intervention by myself back in 2015.  He had a  high-grade calcified right common iliac artery stenosis which underwent diamondback orbital rotational atherectomy, PTA and stenting in the left iliac CTO excised intervened on in a staged fashion.  He also has a small double aortic aneurysm.  His Doppler studies performed 10/03/2018 revealed widely patent stents with normal ABIs.  He denies claudication.  AAA (abdominal aortic aneurysm) Small abdominal aortic aneurysm measuring 3.3 to 3.5 cm which we follow on annual basis by duplex ultrasound most recently performed 10/03/2018.      Lorretta Harp MD FACP,FACC,FAHA, [facility] 07/18/2019 9:44 AM

## 2019-07-18 NOTE — Assessment & Plan Note (Signed)
History of CAD status post anterior STEMI 07/02/2013 treated with aspiration thrombectomy, PCI drug-eluting stenting by myself via the right femoral approach.  His EF was in the 50% range.  He currently denies chest pain or shortness of breath.  He was initially on Brilinta which was transitioned to clopidogrel because of shortness of breath.

## 2019-07-18 NOTE — Assessment & Plan Note (Signed)
Small abdominal aortic aneurysm measuring 3.3 to 3.5 cm which we follow on annual basis by duplex ultrasound most recently performed 10/03/2018.

## 2019-07-18 NOTE — Assessment & Plan Note (Signed)
History of peripheral arterial disease status post staged right and left common iliac intervention by myself back in 2015.  He had a high-grade calcified right common iliac artery stenosis which underwent diamondback orbital rotational atherectomy, PTA and stenting in the left iliac CTO excised intervened on in a staged fashion.  He also has a small double aortic aneurysm.  His Doppler studies performed 10/03/2018 revealed widely patent stents with normal ABIs.  He denies claudication.

## 2019-07-18 NOTE — Patient Instructions (Addendum)
Medication Instructions:  START lisinopril 5 mg daily START atorvastatin (Lipitor) 20 mg daily START CoQ10 200 mg daily  *If you need a refill on your cardiac medications before your next appointment, please call your pharmacy*   Lab Work: BMET in 2 weeks  Please return for FASTING labs in 2 months (Lipid, LFT)  Our in office lab hours are Monday-Friday 8:00-4:00, closed for lunch 12:45-1:45 pm.  No appointment needed.  If you have labs (blood work) drawn today and your tests are completely normal, you will receive your results only by: [name] MyChart Message (if you have MyChart) OR . A paper copy in the mail If you have any lab test that is abnormal or we need to change your treatment, we will call you to review the results.   Testing/Procedures: Your physician has requested that you have a carotid duplex in [place]. This test is an ultrasound of the carotid arteries in your neck. It looks at blood flow through these arteries that supply the brain with blood. Allow one hour for this exam. There are no restrictions or special instructions.  Your physician has requested that you have a lower extremity arterial duplex in [place]. This test is an ultrasound of the arteries in the legs. It looks at arterial blood flow in the legs. Allow one hour for Lower Arterial scans. There are no restrictions or special instructions  Your physician has requested that you have an aorto-iliac duplex in [place]. During this test, an ultrasound is used to evaluate the aorta and iliac arteries. Do not eat after midnight the day before and avoid carbonated beverages  Follow-Up: At [facility], you and your health needs are our priority.  As part of our continuing mission to provide you with exceptional heart care, we have created designated Provider Care Teams.  These Care Teams include your primary Cardiologist (physician) and Advanced Practice Providers (APPs -  Physician Assistants and Nurse Practitioners) who all  work together to provide you with the care you need, when you need it.  We recommend signing up for the patient portal called "MyChart".  Sign up information is provided on this After Visit Summary.  MyChart is used to connect with patients for Virtual Visits (Telemedicine).  Patients are able to view lab/test results, encounter notes, upcoming appointments, etc.  Non-urgent messages can be sent to your provider as well.   To learn more about what you can do with MyChart, go to [email or web address].    Your next appointment:   1 month with pharmD  (bring blood pressure log with you)  12 months with [name]

## 2019-07-18 NOTE — Assessment & Plan Note (Signed)
History of carotid artery disease status post prior stroke and uncomplicated right carotid endarterectomy performed by Dr. Trula Slade 11/27/2015.  His most recent carotid Dopplers performed 10/03/2018 revealed his endarterectomy site to be widely patent.

## 2019-07-18 NOTE — Assessment & Plan Note (Signed)
History of hyperlipidemia currently not on statin therapy with recent lipid profile performed 07/13/2019 revealing a total cholesterol of 163, LDL of 95 and HDL of 29.  I am going to begin him on atorvastatin 20 mg a day and co-Q10 200 mg a day and we will recheck a lipid liver profile in 2 months

## 2019-07-18 NOTE — Assessment & Plan Note (Signed)
History of essential hypertension a blood pressure measured today at 198/82.  His wife says that he has "whitecoat syndrome".  When his blood pressures measured at home is usually in the 150/70 range.  He is only on metoprolol and amlodipine.  I am going to add low-dose lisinopril 5 mg a day, we will recheck a basic metabolic panel in 2 weeks and have him keep a 30-day blood pressure log.  He will come back and see Kristen in 4 weeks to review and make appropriate changes.

## 2019-08-01 DIAGNOSIS — I10 Essential (primary) hypertension: Secondary | ICD-10-CM | POA: Diagnosis not present

## 2019-08-01 DIAGNOSIS — E782 Mixed hyperlipidemia: Secondary | ICD-10-CM | POA: Diagnosis not present

## 2019-08-02 LAB — BASIC METABOLIC PANEL
BUN/Creatinine Ratio: 14 (ref 10–24)
BUN: 14 mg/dL (ref 8–27)
CO2: 21 mmol/L (ref 20–29)
Calcium: 8.8 mg/dL (ref 8.6–10.2)
Chloride: 104 mmol/L (ref 96–106)
Creatinine, Ser: 0.99 mg/dL (ref 0.76–1.27)
GFR calc Af Amer: 82 mL/min/{1.73_m2} (ref >59)
GFR calc non Af Amer: 71 mL/min/{1.73_m2} (ref >59)
Glucose: 150 mg/dL — ABNORMAL HIGH (ref 65–99)
Potassium: 4.4 mmol/L (ref 3.5–5.2)
Sodium: 141 mmol/L (ref 134–144)

## 2019-08-08 ENCOUNTER — Other Ambulatory Visit: Payer: Self-pay

## 2019-08-08 MED ORDER — METOPROLOL TARTRATE 50 MG PO TABS: 50.0000 mg | ORAL_TABLET | Freq: Two times a day (BID) | ORAL | 1 refills | Status: DC

## 2019-08-08 MED ORDER — PANTOPRAZOLE SODIUM 20 MG PO TBEC: 20.0000 mg | DELAYED_RELEASE_TABLET | Freq: Two times a day (BID) | ORAL | 1 refills | Status: DC

## 2019-08-17 ENCOUNTER — Encounter: Payer: Self-pay | Admitting: Pharmacist Clinician (PhC)/ Clinical Pharmacy Specialist

## 2019-08-17 ENCOUNTER — Other Ambulatory Visit: Payer: Self-pay

## 2019-08-17 ENCOUNTER — Ambulatory Visit (INDEPENDENT_AMBULATORY_CARE_PROVIDER_SITE_OTHER): Payer: PPO | Admitting: Pharmacist Clinician (PhC)/ Clinical Pharmacy Specialist

## 2019-08-17 VITALS — BP 176/86 | HR 53 | Resp 14 | Ht 68.0 in | Wt 176.0 lb

## 2019-08-17 DIAGNOSIS — I10 Essential (primary) hypertension: Secondary | ICD-10-CM

## 2019-08-17 MED ORDER — LISINOPRIL 20 MG PO TABS: 20.0000 mg | ORAL_TABLET | Freq: Every day | ORAL | 3 refills | Status: DC

## 2019-08-17 NOTE — Patient Instructions (Addendum)
Return for a a follow up appointment Thursday July 1 at 8 am  Go to the lab in about 3 weeks - 2 weeks after starting the lisinopril 20 mg dose  Check your blood pressure at home daily (if able) and keep record of the readings.  Take your BP meds as follows:  Take lisinopril 5 mg tonight.  Starting tomorrow take 10 mg of lisinopril each night.  After 1 week (about May 27) increase dose to 20 mg each night  Daily schedule:   AM: amlodipine 10 mg, metoprolol 50 mg   PM: lisinopril 20 mg, metoprolol 50 mg  Bring all of your meds, your BP cuff and your record of home blood pressures to your next appointment.  Exercise as you're able, try to walk approximately 30 minutes per day.  Keep salt intake to a minimum, especially watch canned and prepared boxed foods.  Eat more fresh fruits and vegetables and fewer canned items.  Avoid eating in fast food restaurants.    HOW TO TAKE YOUR BLOOD PRESSURE: . Rest 5 minutes before taking your blood pressure. .  Don't smoke or drink caffeinated beverages for at least 30 minutes before. . Take your blood pressure before (not after) you eat. . Sit comfortably with your back supported and both feet on the floor (don't cross your legs). . Elevate your arm to heart level on a table or a desk. . Use the proper sized cuff. It should fit smoothly and snugly around your bare upper arm. There should be enough room to slip a fingertip under the cuff. The bottom edge of the cuff should be 1 inch above the crease of the elbow. . Ideally, take 3 measurements at one sitting and record the average.

## 2019-08-17 NOTE — Progress Notes (Signed)
08/17/2019 [name] [name] 06/15/1934 [id number]   HPI:  [name] [name] is a 85 y.o. male patient of Dr Gwenlyn Found, with a PMH below who presents today for hypertension clinic evaluation.  He was seen by [name] last month and found to have a blood pressure of 198/82.  At that time he was taking amlodipine and metoprolol.  Patient wife noted that he suffered from "Kysean Sweet coat" hypertension, and that home readings were more in the 150/70 range.  [name] added lisinopril 5 mg and asked that he repeat metabolic panel after 2 weeks.  He was also asked to monitor daily home BP readings and bring those to the office for follow up with PharmD.    He is here today with his wife.  He has difficulty with hearing and she answers most of the questions.  He is adamant about not taking any more medications, as he already has three for his blood pressure.  He has had no side effects or concerns since starting the lisinopril.   Past Medical History: ASCVD 2015Anterior STEMI w/aspiration thrombectomy, PCI and stenting of proximal LAD; carotid endarterectomy 2017; multiple strokes/TIA/s per wife    PAD Occluded left iliac and high grade right common iliac stenoses; diamondback atherectomy, PTA and stenting in right common  hyperlipidemia 4/21:  TC 163, TG 230, HDL 29, LDL 95     Blood Pressure Goal:  130/80  Current Medications: amlodipine 10 mg qd - am, metoprolol 50 mg bid, lisinopril 5 mg daily  Family Hx: mother died at 28, DM, complications after amputation; father died at 102 from stroke, prior CABG; one brother died from MI at 3, another at 34 from probably SCD.  One daughter age 60 with recent onset hypertension.    Social Hx: no tobacco or alchol  Diet: mix of home cooked and take out; does use salt with cooking, but not at table; vegetables are fresh; liver pudding; breakfast is eggs, tomato, toast, grits, [place] sausage or bacon (1 piece only); admits to sweet tooth  Exercise: had  stationary bike, not using much regulary  Home BP readings: home meter about 83 years old - 29 daily readings show average of 161/77 with range of 146-180/65-85.  Heart rate average 59  Intolerances: Brilinta caused SOB - now on clopidogrel  Labs: 4/21:  Na 141, K 4.8, Glu 96, BUN 19, SCr 1.15 CrCl   5/21:  Na 141, K 4.4, Glu 150, BUN 14, SCr 0.99  (after starting lisinopril)  Wt Readings from Last 3 Encounters:  08/17/19 176 lb (79.8 kg)  07/18/19 174 lb (78.9 kg)  07/13/19 173 lb 9.6 oz (78.7 kg)   BP Readings from Last 3 Encounters:  08/17/19 (!) 176/86  07/18/19 (!) 170/72  07/13/19 (!) 146/92   Pulse Readings from Last 3 Encounters:  08/17/19 (!) 53  07/18/19 (!) 57  06/03/18 (!) 57    Current Outpatient Medications  Medication Sig Dispense Refill  . acetaminophen (TYLENOL) 500 MG tablet Take 500 mg by mouth every 6 (six) hours as needed for mild pain or moderate pain.     [name] amLODipine (NORVASC) 10 MG tablet Take 1 tablet (10 mg total) by mouth daily. 90 tablet 1  . aspirin EC 81 MG tablet Take 81 mg by mouth daily.    [name] atorvastatin (LIPITOR) 20 MG tablet Take 1 tablet (20 mg total) by mouth daily. 30 tablet 3  . clopidogrel (PLAVIX) 75 MG tablet Take 1 tablet (75  mg total) by mouth daily. 90 tablet 1  . methylcellulose (ARTIFICIAL TEARS) 1 % ophthalmic solution Place 1 drop into both eyes as needed (for dry eyes).    . metoprolol tartrate (LOPRESSOR) 50 MG tablet Take 1 tablet (50 mg total) by mouth 2 (two) times daily. 180 tablet 1  . nitroGLYCERIN (NITROSTAT) 0.4 MG SL tablet Place 1 tablet (0.4 mg total) under the tongue every 5 (five) minutes as needed for chest pain. 25 tablet 2  . pantoprazole (PROTONIX) 20 MG tablet Take 1 tablet (20 mg total) by mouth 2 (two) times daily. 180 tablet 1  . traMADol (ULTRAM) 50 MG tablet Take one tablet po TID prn 90 tablet 4  . Coenzyme Q10 (COQ10) 200 MG CAPS Take 200 mg by mouth daily. 30 capsule 3  . lisinopril (ZESTRIL) 20 MG  tablet Take 1 tablet (20 mg total) by mouth daily. 90 tablet 3  . tamsulosin (FLOMAX) 0.4 MG CAPS capsule Take 1 capsule (0.4 mg total) by mouth daily. (Patient not taking: Reported on 08/17/2019) 30 capsule 3   No current facility-administered medications for this visit.    Allergies  Allergen Reactions  . Brilinta [Ticagrelor] Shortness Of Breath and Other (See Comments)    VERTIGO  . Influenza Vaccines Other (See Comments)    UNSPECIFIED REACTION Pt states he has been hospitalized both times he was given flu vaccine as a younger adult while in the [employer] and they told him not to take it again  . Lipitor [Atorvastatin] Other (See Comments)    MYALGIAS, JOINT PAIN  . Fluoride Preparations     Past Medical History:  Diagnosis Date  . Arthritis    "all over" (09/04/2013)  . Bleeds easily ([place])    d/t being on Plavix and ASA per pt  . CAD (coronary artery disease) 2015   a. STEMI 2002 s/p stent to PDA. b. Anterior STEMI 06/2013 s/p  asp thrombectomy, DES to prox LAD, EF preserved.  . Carotid artery disease ([place]) 10/2013    Total occlusion of the LICA, Q000111Q stenosis of the RICA  . Enlarged prostate   . GERD (gastroesophageal reflux disease)    takes Pantoprazole daily  . Hard of hearing    no hearing aides  . History of colon polyps    benign  . History of diverticulitis   . History of shingles   . Hyperlipidemia    takes Pravastatin daily  . Hypertension    takes Amlodipine and Metoprolol daily  . Joint pain   . Joint swelling   . Myocardial infarction [facility]) at age 49 and other 07/05/11  . Nocturia   . PVD (peripheral vascular disease) with claudication ([place]) 2015   a. 08/2013: s/p diamondback orbital rotational atherectomy and 8 mm x 30 mm long ICast covered stent to calcified ostial right common iliac artery. b. 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  . Stroke ([place]) ~ 2012    x 2:right side is weaker  . Thin skin   .  Thrombocytopenia ([place])   . Urinary frequency     Blood pressure (!) 176/86, pulse (!) 53, resp. rate 14, height 5\' 8"  (1.727 m), weight 176 lb (79.8 kg), SpO2 93 %.  Essential hypertension, benign Patient with hypertension, currently not well controlled.  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  Will have him increase the  lisinopril to 10 mg for 1 week then to 20 mg daily.  He will need to repeat metabolic panel about 2 weeks after the full dose increase.  Wife voiced understanding of this.  He will continue to monitor home blood pressure readings once or twice daily and we will see him back in the office in about 6 weeks.     Tommy Medal PharmD CPP [facility] [address] [place], [place] [address] [phone]

## 2019-08-17 NOTE — Assessment & Plan Note (Signed)
Patient with hypertension, currently not well controlled.  He attributes much of it to white coat syndrome, but his home readings indicate he is high most of the time.  Will have him increase the lisinopril to 10 mg for 1 week then to 20 mg daily.  He will need to repeat metabolic panel about 2 weeks after the full dose increase.  Wife voiced understanding of this.  He will continue to monitor home blood pressure readings once or twice daily and we will see him back in the office in about 6 weeks.

## 2019-09-18 DIAGNOSIS — I10 Essential (primary) hypertension: Secondary | ICD-10-CM | POA: Diagnosis not present

## 2019-09-19 LAB — BASIC METABOLIC PANEL
BUN/Creatinine Ratio: 19 (ref 10–24)
BUN: 20 mg/dL (ref 8–27)
CO2: 22 mmol/L (ref 20–29)
Calcium: 8.7 mg/dL (ref 8.6–10.2)
Chloride: 106 mmol/L (ref 96–106)
Creatinine, Ser: 1.07 mg/dL (ref 0.76–1.27)
GFR calc Af Amer: 74 mL/min/{1.73_m2} (ref >59)
GFR calc non Af Amer: 64 mL/min/{1.73_m2} (ref >59)
Glucose: 105 mg/dL — ABNORMAL HIGH (ref 65–99)
Potassium: 4.6 mmol/L (ref 3.5–5.2)
Sodium: 141 mmol/L (ref 134–144)

## 2019-09-20 ENCOUNTER — Ambulatory Visit (INDEPENDENT_AMBULATORY_CARE_PROVIDER_SITE_OTHER): Payer: PPO | Admitting: Family Medicine

## 2019-09-20 ENCOUNTER — Other Ambulatory Visit: Payer: Self-pay

## 2019-09-20 ENCOUNTER — Encounter: Payer: Self-pay | Admitting: Family Medicine

## 2019-09-20 VITALS — BP 156/86 | Temp 97.9°F | Wt 176.8 lb

## 2019-09-20 DIAGNOSIS — I10 Essential (primary) hypertension: Secondary | ICD-10-CM | POA: Diagnosis not present

## 2019-09-20 DIAGNOSIS — E782 Mixed hyperlipidemia: Secondary | ICD-10-CM

## 2019-09-20 NOTE — Progress Notes (Signed)
   Subjective:    Patient ID: [name] [name], male    DOB: [birth year], 85 y.o.   MRN: [id number]  Hypertension This is a chronic problem. Pertinent negatives include no chest pain, headaches or shortness of breath. Risk factors for coronary artery disease include male gender. There are no compliance problems.    Wife states BP has been "horrible". Pt is now on Lisinopril 20 mg once a day.  Pt is currently not taking Flomax. Pt states he has a "cat " in his nose; nose makes a squealing noise and pt is unsure if it is due to Flomax.  We talked at length about how nasal cartilage can change over the years and cause noises to occur with breathing  Patient also has severe arthritis makes it very difficult for him to get around he is at risk of falling but he uses a cane We talked about avoiding ladders  Fall Risk  06/14/2019 04/12/2019 02/21/2019 01/20/2019 09/14/2016  Falls in the past year? 1 0 0 0 Yes  Comment - - Emmi Telephone Survey: data to providers prior to load - -  Number falls in past yr: 1 - - - 2 or more  Injury with Fall? 0 - - - No  Risk for fall due to : - - - - Impaired mobility  Follow up Falls evaluation completed;Education provided Falls evaluation completed - Falls evaluation completed Education provided     Review of Systems  Constitutional: Negative for diaphoresis and fatigue.  HENT: Negative for congestion and rhinorrhea.   Respiratory: Negative for cough and shortness of breath.   Cardiovascular: Negative for chest pain and leg swelling.  Gastrointestinal: Negative for abdominal pain and diarrhea.  Skin: Negative for color change and rash.  Neurological: Negative for dizziness and headaches.  Psychiatric/Behavioral: Negative for behavioral problems and confusion.       Objective:   Physical Exam Vitals reviewed.  Constitutional:      General: He is not in acute distress. HENT:     Head: Normocephalic and atraumatic.  Eyes:     General:        Right  eye: No discharge.        Left eye: No discharge.  Neck:     Trachea: No tracheal deviation.  Cardiovascular:     Rate and Rhythm: Normal rate and regular rhythm.     Heart sounds: Normal heart sounds. No murmur heard.   Pulmonary:     Effort: Pulmonary effort is normal. No respiratory distress.     Breath sounds: Normal breath sounds.  Lymphadenopathy:     Cervical: No cervical adenopathy.  Skin:    General: Skin is warm and dry.  Neurological:     Mental Status: He is alert.     Coordination: Coordination normal.  Psychiatric:        Behavior: Behavior normal.    Has very narrow nasal passages bilateral       Assessment & Plan:  Recent lab work reviewed that cardiology did creatinine stable Blood pressure improved but still elevated Patient will be following up with pharmacist with cardiology in the near future Healthy diet regular physical activity discussed. Narrow nasal passages May use saline spray as needed but I do not feel surgery is an option for this patient Follow-up in 4 months no lab work currently

## 2019-09-28 ENCOUNTER — Ambulatory Visit: Payer: PPO | Admitting: Pharmacist

## 2019-09-28 ENCOUNTER — Other Ambulatory Visit: Payer: Self-pay

## 2019-09-28 VITALS — BP 178/100 | HR 58 | Resp 15 | Ht 68.0 in | Wt 176.0 lb

## 2019-09-28 DIAGNOSIS — I10 Essential (primary) hypertension: Secondary | ICD-10-CM

## 2019-09-28 MED ORDER — CARVEDILOL 12.5 MG PO TABS: 12.5000 mg | ORAL_TABLET | Freq: Two times a day (BID) | ORAL | 1 refills | Status: DC

## 2019-09-28 NOTE — Progress Notes (Signed)
HPI:  [name] [name] is a 85 y.o. male patient of Dr Gwenlyn Found, with a PMH below who presents today for hypertension clinic follow up.  Patient wife noted that he suffered from "white coat" hypertension and he has diagnosed with HTN at age 31.. We increased his lisinopril to 20mg  daily and repeat BMET shows stable renal function and electrolytes. He is here today with his wife.  He has difficulty with hearing and she answers most of the questions.  He is adamant about not taking any more medications, as he already has three for his blood pressure.  He has had no side effects or concerns with his therapy at the moment.   Past Medical History: ASCVD 2015Anterior STEMI w/aspiration thrombectomy, PCI and stenting of proximal LAD; carotid endarterectomy 2017;  During last OVmultiple strokes/TIA/s per wife    PAD Occluded left iliac and high grade right common iliac stenoses; diamondback atherectomy, PTA and stenting in right common  hyperlipidemia 4/21:  TC 163, TG 230, HDL 29, LDL 95     Blood Pressure Goal:  130/80 if able to tolerate (140/90 acceptable)  Current Medications:  amlodipine 10 mg qd in mornig metoprolol 50 mg bid lisinopril 20 mg daily  Family Hx: mother died at 23, DM, complications after amputation; father died at 90 from stroke, prior CABG; one brother died from MI at 67, another at 53 from probably SCD.  One daughter age 42 with recent onset hypertension.    Social Hx: no tobacco or alchol  Diet: mix of home cooked and take out; does use salt with cooking, but not at table; vegetables are fresh; liver pudding; breakfast is eggs, tomato, toast, grits; admits to sweet tooth (eats cookies, cakes, and other sweets during the day)  Exercise: had stationary bike -  not using much regulary  Home BP readings: 3 morning readings: average 189/82; HR range 54-65 7 evening readings:average 177/82; HR range 53-64  Intolerances: Brilinta caused SOB - now on clopidogrel  Labs:  4/21:  Na 141, K 4.8, Glu 96, BUN 19, SCr 1.15 CrCl   5/21:  Na 141, K 4.4, Glu 150, BUN 14, SCr 0.99  (after starting lisinopril)  7/21: Na141, K4.6, Glu 105, BUN 20, Scr 1.07(lisinopril 20mg )  Wt Readings from Last 3 Encounters:  09/28/19 176 lb (79.8 kg)  09/20/19 176 lb 12.8 oz (80.2 kg)  08/17/19 176 lb (79.8 kg)   BP Readings from Last 3 Encounters:  09/28/19 (!) 178/100  09/20/19 (!) 156/86  08/17/19 (!) 176/86   Pulse Readings from Last 3 Encounters:  09/28/19 (!) 58  08/17/19 (!) 53  07/18/19 (!) 57    Current Outpatient Medications  Medication Sig Dispense Refill  . acetaminophen (TYLENOL) 500 MG tablet Take 500 mg by mouth every 6 (six) hours as needed for mild pain or moderate pain.     [name] amLODipine (NORVASC) 10 MG tablet Take 1 tablet (10 mg total) by mouth daily. 90 tablet 1  . aspirin EC 81 MG tablet Take 81 mg by mouth daily.    [name] atorvastatin (LIPITOR) 20 MG tablet Take 1 tablet (20 mg total) by mouth daily. 30 tablet 3  . clopidogrel (PLAVIX) 75 MG tablet Take 1 tablet (75 mg total) by mouth daily. 90 tablet 1  . Coenzyme Q10 (COQ10) 200 MG CAPS Take 200 mg by mouth daily. 30 capsule 3  . lisinopril (ZESTRIL) 20 MG tablet Take 1 tablet (20 mg total) by mouth daily. 90 tablet  3  . methylcellulose (ARTIFICIAL TEARS) 1 % ophthalmic solution Place 1 drop into both eyes as needed (for dry eyes).    . nitroGLYCERIN (NITROSTAT) 0.4 MG SL tablet Place 1 tablet (0.4 mg total) under the tongue every 5 (five) minutes as needed for chest pain. 25 tablet 2  . pantoprazole (PROTONIX) 20 MG tablet Take 1 tablet (20 mg total) by mouth 2 (two) times daily. 180 tablet 1  . tamsulosin (FLOMAX) 0.4 MG CAPS capsule Take 1 capsule (0.4 mg total) by mouth daily. 30 capsule 3  . traMADol (ULTRAM) 50 MG tablet Take one tablet po TID prn 90 tablet 4  . carvedilol (COREG) 12.5 MG tablet Take 1 tablet (12.5 mg total) by mouth 2 (two) times daily. 180 tablet 1   No current  facility-administered medications for this visit.    Allergies  Allergen Reactions  . Brilinta [Ticagrelor] Shortness Of Breath and Other (See Comments)    VERTIGO  . Influenza Vaccines Other (See Comments)    UNSPECIFIED REACTION Pt states he has been hospitalized both times he was given flu vaccine as a younger adult while in the [employer] and they told him not to take it again  . Lipitor [Atorvastatin] Other (See Comments)    MYALGIAS, JOINT PAIN  . Fluoride Preparations     Past Medical History:  Diagnosis Date  . Arthritis    "all over" (09/04/2013)  . Bleeds easily ([place])    d/t being on Plavix and ASA per pt  . CAD (coronary artery disease) 2015   a. STEMI 2002 s/p stent to PDA. b. Anterior STEMI 06/2013 s/p  asp thrombectomy, DES to prox LAD, EF preserved.  . Carotid artery disease ([place]) 10/2013    Total occlusion of the LICA, 35-70^ stenosis of the RICA  . Enlarged prostate   . GERD (gastroesophageal reflux disease)    takes Pantoprazole daily  . Hard of hearing    no hearing aides  . History of colon polyps    benign  . History of diverticulitis   . History of shingles   . Hyperlipidemia    takes Pravastatin daily  . Hypertension    takes Amlodipine and Metoprolol daily  . Joint pain   . Joint swelling   . Myocardial infarction [facility]) at age 79 and other 07/05/11  . Nocturia   . PVD (peripheral vascular disease) with claudication ([place]) 2015   a. 08/2013: s/p diamondback orbital rotational atherectomy and 8 mm x 30 mm long ICast covered stent to calcified ostial right common iliac artery. b. 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  . Stroke ([place]) ~ 2012    x 2:right side is weaker  . Thin skin   . Thrombocytopenia ([place])   . Urinary frequency     Blood pressure (!) 178/100, pulse (!) 58, resp. rate 15, height 5\' 8"  (1.727 m), weight 176 lb (79.8 kg), SpO2 94 %.  Essential hypertension, benign Blood pressure remains  above goal and slightly worsen from previous OV. Patient reports compliance with all medication , but spouse not sure if he is taking all medication as prescribed.   Will continue lisinopril, and amlodipine as prescribed. Will replace metoprolol for carvedilol 12.5mg  BID. Plan to follow up in 5-6 weeks, and change lisinopril to valsartan if additional BP control needed.    Carliss Quast Rodriguez-Guzman PharmD, BCPS, [facility] [address] [place] [address] 09/28/2019 4:05 PM

## 2019-09-28 NOTE — Patient Instructions (Signed)
Return for a  follow up appointment in 5-6 weeks  Check your blood pressure at home daily (if able) and keep record of the readings.  Take your BP meds as follows: *STOP taking metoprolol 50mg * *START taking carvedilol 12.5mg  twice daily*  Bring all of your meds, your BP cuff and your record of home blood pressures to your next appointment.  Exercise as you're able, try to walk approximately 30 minutes per day.  Keep salt intake to a minimum, especially watch canned and prepared boxed foods.  Eat more fresh fruits and vegetables and fewer canned items.  Avoid eating in fast food restaurants.    HOW TO TAKE YOUR BLOOD PRESSURE: . Rest 5 minutes before taking your blood pressure. .  Don't smoke or drink caffeinated beverages for at least 30 minutes before. . Take your blood pressure before (not after) you eat. . Sit comfortably with your back supported and both feet on the floor (don't cross your legs). . Elevate your arm to heart level on a table or a desk. . Use the proper sized cuff. It should fit smoothly and snugly around your bare upper arm. There should be enough room to slip a fingertip under the cuff. The bottom edge of the cuff should be 1 inch above the crease of the elbow. . Ideally, take 3 measurements at one sitting and record the average.

## 2019-09-28 NOTE — Assessment & Plan Note (Signed)
Blood pressure remains above goal and slightly worsen from previous OV. Patient reports compliance with all medication , but spouse not sure if he is taking all medication as prescribed.   Will continue lisinopril, and amlodipine as prescribed. Will replace metoprolol for carvedilol 12.5mg  BID. Plan to follow up in 5-6 weeks, and change lisinopril to valsartan if additional BP control needed.

## 2019-10-04 ENCOUNTER — Encounter (HOSPITAL_COMMUNITY): Payer: PPO

## 2019-10-09 ENCOUNTER — Ambulatory Visit (HOSPITAL_BASED_OUTPATIENT_CLINIC_OR_DEPARTMENT_OTHER)
Admission: RE | Admit: 2019-10-09 | Discharge: 2019-10-09 | Disposition: A | Discharge status: Home / Self Care | Payer: PPO | Source: Ambulatory Visit | Attending: Cardiovascular Disease | Admitting: Cardiovascular Disease

## 2019-10-09 ENCOUNTER — Other Ambulatory Visit (HOSPITAL_COMMUNITY): Payer: Self-pay | Admitting: Cardiovascular Disease

## 2019-10-09 ENCOUNTER — Ambulatory Visit (HOSPITAL_COMMUNITY)
Admission: RE | Admit: 2019-10-09 | Discharge: 2019-10-09 | Disposition: A | Discharge status: Home / Self Care | Payer: PPO | Source: Ambulatory Visit | Attending: Cardiovascular Disease | Admitting: Cardiovascular Disease

## 2019-10-09 ENCOUNTER — Other Ambulatory Visit: Payer: Self-pay

## 2019-10-09 DIAGNOSIS — Z9889 Other specified postprocedural states: Secondary | ICD-10-CM

## 2019-10-09 DIAGNOSIS — I739 Peripheral vascular disease, unspecified: Secondary | ICD-10-CM | POA: Insufficient documentation

## 2019-10-09 DIAGNOSIS — Z95828 Presence of other vascular implants and grafts: Secondary | ICD-10-CM | POA: Insufficient documentation

## 2019-10-09 DIAGNOSIS — I6522 Occlusion and stenosis of left carotid artery: Secondary | ICD-10-CM

## 2019-10-09 DIAGNOSIS — I6523 Occlusion and stenosis of bilateral carotid arteries: Secondary | ICD-10-CM

## 2019-10-19 ENCOUNTER — Telehealth: Payer: Self-pay

## 2019-10-19 NOTE — Telephone Encounter (Addendum)
Left a detailed message for the patient of the results and asked to give our office a call if he has any questions.   ----- Message from Lorretta Harp, MD sent at 10/15/2019  3:04 PM EDT ----- No change from prior study. Repeat in 12 months.

## 2019-10-20 ENCOUNTER — Other Ambulatory Visit: Payer: Self-pay

## 2019-10-20 DIAGNOSIS — I739 Peripheral vascular disease, unspecified: Secondary | ICD-10-CM

## 2019-10-20 DIAGNOSIS — Z95828 Presence of other vascular implants and grafts: Secondary | ICD-10-CM

## 2019-10-20 NOTE — Progress Notes (Signed)
vas 

## 2019-10-23 ENCOUNTER — Other Ambulatory Visit: Payer: Self-pay | Admitting: *Deleted

## 2019-10-23 MED ORDER — CLOPIDOGREL BISULFATE 75 MG PO TABS: 75.0000 mg | ORAL_TABLET | Freq: Every day | ORAL | 0 refills | Status: DC

## 2019-11-16 ENCOUNTER — Other Ambulatory Visit: Payer: Self-pay

## 2019-11-16 ENCOUNTER — Ambulatory Visit: Payer: PPO | Admitting: Pharmacist Clinician (PhC)/ Clinical Pharmacy Specialist

## 2019-11-16 DIAGNOSIS — I10 Essential (primary) hypertension: Secondary | ICD-10-CM

## 2019-11-16 MED ORDER — LOSARTAN POTASSIUM 100 MG PO TABS
100.0000 mg | ORAL_TABLET | Freq: Every day | ORAL | 3 refills | Status: DC
Start: 2019-11-16 — End: 2019-12-28

## 2019-11-16 MED ORDER — METOPROLOL TARTRATE 50 MG PO TABS: 50.0000 mg | ORAL_TABLET | Freq: Two times a day (BID) | ORAL | 1 refills | Status: DC

## 2019-11-16 NOTE — Progress Notes (Signed)
HPI:  [name] [name] is a 85 y.o. male patient of Dr Gwenlyn Found, with a PMH below who presents today for hypertension clinic follow up.  Patient wife noted that he suffered from "white coat" hypertension and he was diagnosed with HTN at age 17.. We increased his lisinopril to 20mg  daily and repeat BMET shows stable renal function and electrolytes. He is here today with his wife.  He has difficulty with hearing and she answers most of the questions.  He is adamant about not taking any more medications, as he already has three for his blood pressure.  He has had no side effects or concerns with his therapy at the moment.   At his last visit his blood pressure was actually higher and his wife noted that she was not sure he was taking medications as prescribed.  The metoprolol was switched to carvedilol 12.5 mg and he was asked to return today for follow up.     Past Medical History: ASCVD 2015Anterior STEMI w/aspiration thrombectomy, PCI and stenting of proximal LAD; carotid endarterectomy 2017;  During last OVmultiple strokes/TIA/s per wife    PAD Occluded left iliac and high grade right common iliac stenoses; diamondback atherectomy, PTA and stenting in right common  hyperlipidemia 4/21:  TC 163, TG 230, HDL 29, LDL 95     Blood Pressure Goal:  130/80 if able to tolerate (140/90 acceptable)  Current Medications:  amlodipine 10 mg qd in mornig Carvedilol 12.5 mg bid lisinopril 20 mg daily  Family Hx: mother died at 91, DM, complications after amputation; father died at 68 from stroke, prior CABG; one brother died from MI at 8, another at 7 from probably SCD.  One daughter age 36 with recent onset hypertension.    Social Hx: no tobacco or alchol  Diet: mix of home cooked and take out; does use salt with cooking, but not at table; vegetables are fresh; liver pudding; breakfast is eggs, tomato, toast, grits; admits to sweet tooth (eats cookies, cakes, and other sweets during the  day)  Exercise: had stationary bike -  not using much regulary  Home BP readings: 3 morning readings: average 189/82; HR range 54-65 7 evening readings:average 177/82; HR range 53-64  Intolerances: Brilinta caused SOB - now on clopidogrel  Labs: 4/21:  Na 141, K 4.8, Glu 96, BUN 19, SCr 1.15 CrCl   5/21:  Na 141, K 4.4, Glu 150, BUN 14, SCr 0.99  (after starting lisinopril)  7/21: Na141, K4.6, Glu 105, BUN 20, Scr 1.07(lisinopril 20mg )  Wt Readings from Last 3 Encounters:  09/28/19 176 lb (79.8 kg)  09/20/19 176 lb 12.8 oz (80.2 kg)  08/17/19 176 lb (79.8 kg)   BP Readings from Last 3 Encounters:  09/28/19 (!) 178/100  09/20/19 (!) 156/86  08/17/19 (!) 176/86   Pulse Readings from Last 3 Encounters:  09/28/19 (!) 58  08/17/19 (!) 53  07/18/19 (!) 57    Current Outpatient Medications  Medication Sig Dispense Refill  . acetaminophen (TYLENOL) 500 MG tablet Take 500 mg by mouth every 6 (six) hours as needed for mild pain or moderate pain.     [name] amLODipine (NORVASC) 10 MG tablet Take 1 tablet (10 mg total) by mouth daily. 90 tablet 1  . aspirin EC 81 MG tablet Take 81 mg by mouth daily.    [name] atorvastatin (LIPITOR) 20 MG tablet Take 1 tablet (20 mg total) by mouth daily. 30 tablet 3  . carvedilol (COREG) 12.5 MG  tablet Take 1 tablet (12.5 mg total) by mouth 2 (two) times daily. 180 tablet 1  . clopidogrel (PLAVIX) 75 MG tablet Take 1 tablet (75 mg total) by mouth daily. 90 tablet 0  . Coenzyme Q10 (COQ10) 200 MG CAPS Take 200 mg by mouth daily. 30 capsule 3  . lisinopril (ZESTRIL) 20 MG tablet Take 1 tablet (20 mg total) by mouth daily. 90 tablet 3  . methylcellulose (ARTIFICIAL TEARS) 1 % ophthalmic solution Place 1 drop into both eyes as needed (for dry eyes).    . nitroGLYCERIN (NITROSTAT) 0.4 MG SL tablet Place 1 tablet (0.4 mg total) under the tongue every 5 (five) minutes as needed for chest pain. 25 tablet 2  . pantoprazole (PROTONIX) 20 MG tablet Take 1 tablet (20 mg  total) by mouth 2 (two) times daily. 180 tablet 1  . tamsulosin (FLOMAX) 0.4 MG CAPS capsule Take 1 capsule (0.4 mg total) by mouth daily. 30 capsule 3  . traMADol (ULTRAM) 50 MG tablet Take one tablet po TID prn 90 tablet 4   No current facility-administered medications for this visit.    Allergies  Allergen Reactions  . Brilinta [Ticagrelor] Shortness Of Breath and Other (See Comments)    VERTIGO  . Influenza Vaccines Other (See Comments)    UNSPECIFIED REACTION Pt states he has been hospitalized both times he was given flu vaccine as a younger adult while in the [employer] and they told him not to take it again  . Lipitor [Atorvastatin] Other (See Comments)    MYALGIAS, JOINT PAIN  . Fluoride Preparations     Past Medical History:  Diagnosis Date  . Arthritis    "all over" (09/04/2013)  . Bleeds easily ([place])    d/t being on Plavix and ASA per pt  . CAD (coronary artery disease) 2015   a. STEMI 2002 s/p stent to PDA. b. Anterior STEMI 06/2013 s/p  asp thrombectomy, DES to prox LAD, EF preserved.  . Carotid artery disease ([place]) 10/2013    Total occlusion of the LICA, 09-98^ stenosis of the RICA  . Enlarged prostate   . GERD (gastroesophageal reflux disease)    takes Pantoprazole daily  . Hard of hearing    no hearing aides  . History of colon polyps    benign  . History of diverticulitis   . History of shingles   . Hyperlipidemia    takes Pravastatin daily  . Hypertension    takes Amlodipine and Metoprolol daily  . Joint pain   . Joint swelling   . Myocardial infarction [facility]) at age 66 and other 07/05/11  . Nocturia   . PVD (peripheral vascular disease) with claudication ([place]) 2015   a. 08/2013: s/p diamondback orbital rotational atherectomy and 8 mm x 30 mm long ICast covered stent to calcified ostial right common iliac artery. b. 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  . Stroke ([place]) ~ 2012    x 2:right side is weaker  .  Thin skin   . Thrombocytopenia ([place])   . Urinary frequency     There were no vitals taken for this visit.  No problem-specific Assessment & Plan notes found for this encounter.   Tommy Medal PharmD CPP [facility] [address] [place] [address] 11/16/2019 7:29 AM

## 2019-11-16 NOTE — Progress Notes (Signed)
HPI:  [name] [name] is a 85 y.o. male patient of Dr Gwenlyn Found, with a PMH below who presents today for hypertension clinic follow up.  Patient wife noted that he suffered from "white coat" hypertension and he was diagnosed with HTN at age 89.. We increased his lisinopril to 20mg  daily and repeat BMET shows stable renal function and electrolytes. He is here today with his wife.  He has difficulty with hearing and she answers most of the questions.  He is adamant about not taking any more medications, as he already has three for his blood pressure.  He has had no side effects or concerns with his therapy at the moment.   At his last visit his blood pressure was actually higher and his wife noted that she was not sure he was taking medications as prescribed.  The metoprolol was switched to carvedilol 12.5 mg and he was asked to return today for follow up.   He returns today with his wife.  She answers most of the questions for him.  He is hard of hearing, but answers some and corrects her on others.  They admit he doesn't like to take medications, but with the exception of tamsulosin, he does take all his medications regularly.  He just doesn't feel as though the tamsulosin is helping him.  Since his last visit he has been feeling more tired and off balance, and believes this is due to the carvedilol.  They are also wondering why his blood pressure won't come down and whether he should be taking losartan, as that is what is working well for his daughter and brother.   They forgot his home BP readings, though his wife notes that home readings have been 353-299 range systolic, and this morning was 139/66.     Past Medical History: ASCVD 2015Anterior STEMI w/aspiration thrombectomy, PCI and stenting of proximal LAD; carotid endarterectomy 2017;  During last OVmultiple strokes/TIA/s per wife    PAD Occluded left iliac and high grade right common iliac stenoses; diamondback atherectomy, PTA and stenting in  right common  hyperlipidemia 4/21:  TC 163, TG 230, HDL 29, LDL 95     Blood Pressure Goal:  130/80 if able to tolerate (140/90 acceptable)  Current Medications:  amlodipine 10 mg qd in mornig Carvedilol 12.5 mg bid lisinopril 20 mg daily  Family Hx: mother died at 69, DM, complications after amputation; father died at 57 from stroke, prior CABG; one brother died from MI at 75, another at 5 from probably SCD.  One daughter age 40 with recent onset hypertension.    Social Hx: no tobacco or alchol  Diet: mix of home cooked and take out; does use salt with cooking, but not at table; vegetables are fresh; liver pudding; breakfast is eggs, tomato, toast, grits; admits to sweet tooth (eats cookies, cakes, and other sweets during the day)  Exercise: had stationary bike -  not using much regulary  Home BP readings: no home readings with them  Intolerances: Brilinta caused SOB - now on clopidogrel  Labs: 4/21:  Na 141, K 4.8, Glu 96, BUN 19, SCr 1.15 CrCl   5/21:  Na 141, K 4.4, Glu 150, BUN 14, SCr 0.99  (after starting lisinopril)  7/21: Na141, K4.6, Glu 105, BUN 20, Scr 1.07(lisinopril 20mg )  Wt Readings from Last 3 Encounters:  11/16/19 174 lb (78.9 kg)  09/28/19 176 lb (79.8 kg)  09/20/19 176 lb 12.8 oz (80.2 kg)   BP Readings  from Last 3 Encounters:  11/16/19 (!) 190/90  09/28/19 (!) 178/100  09/20/19 (!) 156/86   Pulse Readings from Last 3 Encounters:  11/16/19 78  09/28/19 (!) 58  08/17/19 (!) 53    Current Outpatient Medications  Medication Sig Dispense Refill   acetaminophen (TYLENOL) 500 MG tablet Take 500 mg by mouth every 6 (six) hours as needed for mild pain or moderate pain.      amLODipine (NORVASC) 10 MG tablet Take 1 tablet (10 mg total) by mouth daily. 90 tablet 1   aspirin EC 81 MG tablet Take 81 mg by mouth daily.     atorvastatin (LIPITOR) 20 MG tablet Take 1 tablet (20 mg total) by mouth daily. 30 tablet 3   carvedilol (COREG) 12.5 MG tablet Take 1  tablet (12.5 mg total) by mouth 2 (two) times daily. 180 tablet 1   clopidogrel (PLAVIX) 75 MG tablet Take 1 tablet (75 mg total) by mouth daily. 90 tablet 0   Coenzyme Q10 (COQ10) 200 MG CAPS Take 200 mg by mouth daily. 30 capsule 3   methylcellulose (ARTIFICIAL TEARS) 1 % ophthalmic solution Place 1 drop into both eyes as needed (for dry eyes).     nitroGLYCERIN (NITROSTAT) 0.4 MG SL tablet Place 1 tablet (0.4 mg total) under the tongue every 5 (five) minutes as needed for chest pain. 25 tablet 2   pantoprazole (PROTONIX) 20 MG tablet Take 1 tablet (20 mg total) by mouth 2 (two) times daily. 180 tablet 1   traMADol (ULTRAM) 50 MG tablet Take one tablet po TID prn 90 tablet 4   losartan (COZAAR) 100 MG tablet Take 1 tablet (100 mg total) by mouth daily. 90 tablet 3   metoprolol tartrate (LOPRESSOR) 50 MG tablet Take 1 tablet (50 mg total) by mouth 2 (two) times daily. 180 tablet 1   tamsulosin (FLOMAX) 0.4 MG CAPS capsule Take 1 capsule (0.4 mg total) by mouth daily. (Patient not taking: Reported on 11/16/2019) 30 capsule 3   No current facility-administered medications for this visit.    Allergies  Allergen Reactions   Brilinta [Ticagrelor] Shortness Of Breath and Other (See Comments)    VERTIGO   Influenza Vaccines Other (See Comments)    UNSPECIFIED REACTION Pt states he has been hospitalized both times he was given flu vaccine as a younger adult while in the [employer] and they told him not to take it again   Lipitor [Atorvastatin] Other (See Comments)    MYALGIAS, JOINT PAIN   Fluoride Preparations     Past Medical History:  Diagnosis Date   Arthritis    "all over" (09/04/2013)   Bleeds easily ([place])    d/t being on Plavix and ASA per pt   CAD (coronary artery disease) 2015   a. STEMI 2002 s/p stent to PDA. b. Anterior STEMI 06/2013 s/p  asp thrombectomy, DES to prox LAD, EF preserved.   Carotid artery disease ([place]) 10/2013    Total occlusion of the LICA, 18-29^ stenosis of the RICA    Enlarged prostate    GERD (gastroesophageal reflux disease)    takes Pantoprazole daily   Hard of hearing    no hearing aides   History of colon polyps    benign   History of diverticulitis    History of shingles    Hyperlipidemia    takes Pravastatin daily   Hypertension    takes Amlodipine and Metoprolol daily   Joint pain    Joint swelling  Myocardial infarction [facility]) at age 83 and other 07/05/11   Nocturia    PVD (peripheral vascular disease) with claudication ([place]) 2015   a. 08/2013: s/p diamondback orbital rotational atherectomy and 8 mm x 30 mm long ICast covered stent to calcified ostial right common iliac artery. b. 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   Stroke [facility]) ~ 2012    x 2:right side is weaker   Thin skin    Thrombocytopenia ([facility])    Urinary frequency     Blood pressure (!) 190/90, pulse 78, resp. rate 18, height 5\' 8"  (1.727 m), weight 174 lb (78.9 kg), SpO2 92 %.  Essential hypertension, benign Patient with essential hypertension, he does not like how he feels since switching from metoprolol to carvedilol.  Will have him stop carvedilol and go back on metoprolol 50 mg bid.  Will also switch lisinopril to losartan 100 mg daily.  He orders medication thru mail order, so was advised to not stop carvedilol and lisinopril until the new medications arrive.  We will see him back in 6 weeks for follow up.  He should continue with regular home BP checks and was given log sheets to record these readings.     Tommy Medal PharmD CPP [facility] [address] [place] [address] 11/16/2019 3:45 PM

## 2019-11-16 NOTE — Patient Instructions (Addendum)
   Check your blood pressure at home daily (if able) and keep record of the readings.  Take your BP meds as follows:  Stop lisinopril and carvedilol - not until you get the new medications  Start losartan 100 mg once daily  Start metoprolol 50 mg twice daily    Continue with all other medications  Bring all of your meds, your BP cuff and your record of home blood pressures to your next appointment.  Exercise as you're able, try to walk approximately 30 minutes per day.  Keep salt intake to a minimum, especially watch canned and prepared boxed foods.  Eat more fresh fruits and vegetables and fewer canned items.  Avoid eating in fast food restaurants.    HOW TO TAKE YOUR BLOOD PRESSURE: . Rest 5 minutes before taking your blood pressure. .  Don't smoke or drink caffeinated beverages for at least 30 minutes before. . Take your blood pressure before (not after) you eat. . Sit comfortably with your back supported and both feet on the floor (don't cross your legs). . Elevate your arm to heart level on a table or a desk. . Use the proper sized cuff. It should fit smoothly and snugly around your bare upper arm. There should be enough room to slip a fingertip under the cuff. The bottom edge of the cuff should be 1 inch above the crease of the elbow. . Ideally, take 3 measurements at one sitting and record the average.

## 2019-11-16 NOTE — Assessment & Plan Note (Signed)
Patient with essential hypertension, he does not like how he feels since switching from metoprolol to carvedilol.  Will have him stop carvedilol and go back on metoprolol 50 mg bid.  Will also switch lisinopril to losartan 100 mg daily.  He orders medication thru mail order, so was advised to not stop carvedilol and lisinopril until the new medications arrive.  We will see him back in 6 weeks for follow up.  He should continue with regular home BP checks and was given log sheets to record these readings.

## 2019-11-18 ENCOUNTER — Other Ambulatory Visit: Payer: Self-pay | Admitting: Cardiovascular Disease

## 2019-11-23 ENCOUNTER — Other Ambulatory Visit: Payer: Self-pay | Admitting: *Deleted

## 2019-11-23 MED ORDER — AMLODIPINE BESYLATE 10 MG PO TABS: 10.0000 mg | ORAL_TABLET | Freq: Every day | ORAL | 0 refills | Status: DC

## 2019-11-28 NOTE — Telephone Encounter (Signed)
error 

## 2019-12-19 ENCOUNTER — Other Ambulatory Visit: Payer: Self-pay | Admitting: *Deleted

## 2019-12-19 MED ORDER — PANTOPRAZOLE SODIUM 20 MG PO TBEC: 20.0000 mg | DELAYED_RELEASE_TABLET | Freq: Two times a day (BID) | ORAL | 0 refills | Status: DC

## 2019-12-20 ENCOUNTER — Other Ambulatory Visit: Payer: Self-pay | Admitting: Family Medicine

## 2019-12-22 ENCOUNTER — Other Ambulatory Visit: Payer: Self-pay | Admitting: *Deleted

## 2019-12-23 MED ORDER — CLOPIDOGREL BISULFATE 75 MG PO TABS: 75.0000 mg | ORAL_TABLET | Freq: Every day | ORAL | 0 refills | Status: DC

## 2019-12-28 ENCOUNTER — Other Ambulatory Visit: Payer: Self-pay

## 2019-12-28 ENCOUNTER — Ambulatory Visit (INDEPENDENT_AMBULATORY_CARE_PROVIDER_SITE_OTHER): Payer: PPO | Admitting: Pharmacist

## 2019-12-28 VITALS — BP 162/100 | HR 61 | Resp 15 | Ht 68.0 in | Wt 177.8 lb

## 2019-12-28 DIAGNOSIS — I10 Essential (primary) hypertension: Secondary | ICD-10-CM | POA: Diagnosis not present

## 2019-12-28 MED ORDER — LISINOPRIL 40 MG PO TABS
40.0000 mg | ORAL_TABLET | Freq: Every day | ORAL | 3 refills | Status: DC
Start: 2019-12-28 — End: 2020-03-07

## 2019-12-28 NOTE — Patient Instructions (Addendum)
Return for a  follow up appointment in 3 months (will schedule in 2 months)  Go to the lab in 1 month by PCP  Check your blood pressure at home daily (if able) and keep record of the readings.  Take your BP meds as follows: *STOP taking losartan* *START taking lisinopril 40mg  daily*   Bring all of your meds, your BP cuff and your record of home blood pressures to your next appointment.  Exercise as you're able, try to walk approximately 30 minutes per day.  Keep salt intake to a minimum, especially watch canned and prepared boxed foods.  Eat more fresh fruits and vegetables and fewer canned items.  Avoid eating in fast food restaurants.    HOW TO TAKE YOUR BLOOD PRESSURE: . Rest 5 minutes before taking your blood pressure. .  Don't smoke or drink caffeinated beverages for at least 30 minutes before. . Take your blood pressure before (not after) you eat. . Sit comfortably with your back supported and both feet on the floor (don't cross your legs). . Elevate your arm to heart level on a table or a desk. . Use the proper sized cuff. It should fit smoothly and snugly around your bare upper arm. There should be enough room to slip a fingertip under the cuff. The bottom edge of the cuff should be 1 inch above the crease of the elbow. . Ideally, take 3 measurements at one sitting and record the average.

## 2019-12-28 NOTE — Progress Notes (Signed)
HPI:  [name] [name] is a 85 y.o. male patient of Dr Gwenlyn Found, with a Patient's wife noted that he suffered from "white coat" hypertension and he was diagnosed with HTN at age 81.  He is adamant about not taking any more medications, as he already has three for his blood pressure.  He has had no side effects or concerns with his therapy at the moment.  Patient was unable to tolerate switch from metoprolol to carvedilol d/t increased fatigue.  Patient wanted to try losartan , as that is what is working well for his daughter and brother.  During last OV his lisinopril was changed to losartan 100mg  and patient presents today for follow up.    Past Medical History: ASCVD 2015Anterior STEMI w/aspiration thrombectomy, PCI and stenting of proximal LAD; carotid endarterectomy 2017;  During last OVmultiple strokes/TIA/s per wife    PAD Occluded left iliac and high grade right common iliac stenoses; diamondback atherectomy, PTA and stenting in right common  hyperlipidemia 4/21:  TC 163, TG 230, HDL 29, LDL 95     Blood Pressure Goal:  < 140/90  Current Medications:  amlodipine 10 mg daily - night  Metoprolol tartrate 50mg  twice daily (8am & 7pm) Losartan 100mg  daily - night  Family Hx: mother died at 68, DM, complications after amputation; father died at 33 from stroke, prior CABG; one brother died from MI at 89, another at 55 from probably SCD.  One daughter age 4 with recent onset hypertension.    Social Hx: no tobacco or alcohol  Diet: mix of home cooked and take out; does use salt with cooking, but not at table; vegetables are fresh; liver pudding; breakfast is eggs, tomato, toast, grits; admits to sweet tooth (eats cookies, cakes, and other sweets during the day)  Exercise: had stationary bike -  not using regulary  Home BP readings:  15 readings; average 172/79; HR range   Intolerances:  Brilinta caused SOB - now on clopidogrel Carvedilol - sleepy, tired, lack energy  Labs:        7/21: Na141, K4.6, Glu 105, BUN 20, Scr 1.07(lisinopril 20mg ) 5/21:  Na 141, K 4.4, Glu 150, BUN 14, SCr 0.99   Wt Readings from Last 3 Encounters:  12/28/19 177 lb 12.8 oz (80.6 kg)  11/16/19 174 lb (78.9 kg)  09/28/19 176 lb (79.8 kg)   BP Readings from Last 3 Encounters:  12/28/19 (!) 162/100  11/16/19 (!) 190/90  09/28/19 (!) 178/100   Pulse Readings from Last 3 Encounters:  12/28/19 61  11/16/19 78  09/28/19 (!) 58    Current Outpatient Medications  Medication Sig Dispense Refill  . acetaminophen (TYLENOL) 500 MG tablet Take 500 mg by mouth every 6 (six) hours as needed for mild pain or moderate pain.     [name] amLODipine (NORVASC) 10 MG tablet Take 1 tablet (10 mg total) by mouth daily. 90 tablet 0  . aspirin EC 81 MG tablet Take 81 mg by mouth daily.    [name] atorvastatin (LIPITOR) 20 MG tablet TAKE 1 TABLET(20 MG) BY MOUTH DAILY 30 tablet 8  . clopidogrel (PLAVIX) 75 MG tablet Take 1 tablet (75 mg total) by mouth daily. 90 tablet 0  . Coenzyme Q10 (COQ10) 200 MG CAPS Take 200 mg by mouth daily. 30 capsule 3  . methylcellulose (ARTIFICIAL TEARS) 1 % ophthalmic solution Place 1 drop into both eyes as needed (for dry eyes).    . metoprolol tartrate (LOPRESSOR) 50 MG tablet  Take 1 tablet (50 mg total) by mouth 2 (two) times daily. 180 tablet 1  . nitroGLYCERIN (NITROSTAT) 0.4 MG SL tablet Place 1 tablet (0.4 mg total) under the tongue every 5 (five) minutes as needed for chest pain. 25 tablet 2  . pantoprazole (PROTONIX) 20 MG tablet Take 1 tablet (20 mg total) by mouth 2 (two) times daily. 180 tablet 0  . tamsulosin (FLOMAX) 0.4 MG CAPS capsule Take 1 capsule (0.4 mg total) by mouth daily. 30 capsule 3  . traMADol (ULTRAM) 50 MG tablet TAKE 1 TABLET BY MOUTH THREE TIMES DAILY AS NEEDED FOR MODERATE PAIN 90 tablet 1  . lisinopril (ZESTRIL) 40 MG tablet Take 1 tablet (40 mg total) by mouth daily. 90 tablet 3   No current facility-administered medications for this visit.     Allergies  Allergen Reactions  . Brilinta [Ticagrelor] Shortness Of Breath and Other (See Comments)    VERTIGO  . Influenza Vaccines Other (See Comments)    UNSPECIFIED REACTION Pt states he has been hospitalized both times he was given flu vaccine as a younger adult while in the [employer] and they told him not to take it again  . Lipitor [Atorvastatin] Other (See Comments)    MYALGIAS, JOINT PAIN  . Fluoride Preparations     Past Medical History:  Diagnosis Date  . Arthritis    "all over" (09/04/2013)  . Bleeds easily ([place])    d/t being on Plavix and ASA per pt  . CAD (coronary artery disease) 2015   a. STEMI 2002 s/p stent to PDA. b. Anterior STEMI 06/2013 s/p  asp thrombectomy, DES to prox LAD, EF preserved.  . Carotid artery disease ([place]) 10/2013    Total occlusion of the LICA, 03-50^ stenosis of the RICA  . Enlarged prostate   . GERD (gastroesophageal reflux disease)    takes Pantoprazole daily  . Hard of hearing    no hearing aides  . History of colon polyps    benign  . History of diverticulitis   . History of shingles   . Hyperlipidemia    takes Pravastatin daily  . Hypertension    takes Amlodipine and Metoprolol daily  . Joint pain   . Joint swelling   . Myocardial infarction [facility]) at age 35 and other 07/05/11  . Nocturia   . PVD (peripheral vascular disease) with claudication ([place]) 2015   a. 08/2013: s/p diamondback orbital rotational atherectomy and 8 mm x 30 mm long ICast covered stent to calcified ostial right common iliac artery. b. 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  . Stroke ([place]) ~ 2012    x 2:right side is weaker  . Thin skin   . Thrombocytopenia ([place])   . Urinary frequency     Blood pressure (!) 162/100, pulse 61, resp. rate 15, height 5\' 8"  (1.727 m), weight 177 lb 12.8 oz (80.6 kg), SpO2 95 %.  Essential hypertension Patient is against adding more medication to his regimen, but is willing to  go back to lisinopril therapy. Will stop losartan, and start lisinopril at 40mg  daily instead of 20mg . Patien will work on medication compliance and return for follow up in12 weeks.   I will follow up with him by pone in 6-8 weeks to assess tolerability and schedule follow up visit. Patient will repeat BMEt at PCP follow up in 3 weeks.    Elnoria Livingston Rodriguez-Guzman PharmD, BCPS, [place] [address] [id number] 01/03/2020 9:40 AM

## 2020-01-03 ENCOUNTER — Encounter: Payer: Self-pay | Admitting: Pharmacist

## 2020-01-03 NOTE — Assessment & Plan Note (Signed)
Patient is against adding more medication to his regimen, but is willing to go back to lisinopril therapy. Will stop losartan, and start lisinopril at 40mg  daily instead of 20mg . Patien will work on medication compliance and return for follow up in12 weeks.   I will follow up with him by pone in 6-8 weeks to assess tolerability and schedule follow up visit. Patient will repeat BMEt at PCP follow up in 3 weeks.

## 2020-01-05 ENCOUNTER — Other Ambulatory Visit: Payer: Self-pay | Admitting: Family Medicine

## 2020-01-19 ENCOUNTER — Encounter: Payer: Self-pay | Admitting: Family Medicine

## 2020-01-19 ENCOUNTER — Other Ambulatory Visit: Payer: Self-pay

## 2020-01-19 ENCOUNTER — Ambulatory Visit (INDEPENDENT_AMBULATORY_CARE_PROVIDER_SITE_OTHER): Payer: PPO | Admitting: Family Medicine

## 2020-01-19 VITALS — BP 122/70 | HR 72 | Temp 97.6°F | Ht 68.0 in | Wt 177.0 lb

## 2020-01-19 DIAGNOSIS — E785 Hyperlipidemia, unspecified: Secondary | ICD-10-CM | POA: Diagnosis not present

## 2020-01-19 DIAGNOSIS — Z79899 Other long term (current) drug therapy: Secondary | ICD-10-CM | POA: Diagnosis not present

## 2020-01-19 DIAGNOSIS — I10 Essential (primary) hypertension: Secondary | ICD-10-CM | POA: Diagnosis not present

## 2020-01-19 NOTE — Progress Notes (Signed)
   Subjective:    Patient ID: [name] [name], male    DOB: [birth year], 85 y.o.   MRN: [id number]  HPImed check up. Takes his medicine regular basis.  Tries watch his diet Follows with cardiology Recently got confused regarding medications was thinking that he should be taking losartan and lisinopril at the same time. Concerns about fatigue since having a stomach virus last week.  Patient has had some fatigue tiredness and stomach virus last week.  Wife is concerned that his electrolytes may be abnormal.  He is urinating well drinking well. Declines flu vaccine.     Review of Systems  Constitutional: Negative for activity change, fatigue and fever.  HENT: Negative for congestion and rhinorrhea.   Respiratory: Negative for cough and shortness of breath.   Cardiovascular: Negative for chest pain and leg swelling.  Gastrointestinal: Negative for abdominal pain, diarrhea and nausea.  Genitourinary: Negative for dysuria and hematuria.  Neurological: Negative for weakness and headaches.  Psychiatric/Behavioral: Negative for agitation and behavioral problems.       Objective:   Physical Exam Vitals reviewed.  Constitutional:      General: He is not in acute distress. HENT:     Head: Normocephalic and atraumatic.  Eyes:     General:        Right eye: No discharge.        Left eye: No discharge.  Neck:     Trachea: No tracheal deviation.  Cardiovascular:     Rate and Rhythm: Normal rate and regular rhythm.     Heart sounds: Normal heart sounds. No murmur heard.   Pulmonary:     Effort: Pulmonary effort is normal. No respiratory distress.     Breath sounds: Normal breath sounds.  Lymphadenopathy:     Cervical: No cervical adenopathy.  Skin:    General: Skin is warm and dry.  Neurological:     Mental Status: He is alert.     Coordination: Coordination normal.  Psychiatric:        Behavior: Behavior normal.           Assessment & Plan:  1. Hyperlipidemia,  unspecified hyperlipidemia type Continue current medication check lipid profile - Lipid panel - Lipid panel  2. High risk medication use Check liver function - Hepatic function panel - Hepatic function panel  3. Essential hypertension, benign Patient was confused taking lisinopril and losartan I told him to cut it back to just lisinopril stop the losartan he will use 40 mg lisinopril daily check lab work to make sure his creatinine is doing okay - Basic metabolic panel Follow-up in 4 to 6 months

## 2020-01-22 DIAGNOSIS — E785 Hyperlipidemia, unspecified: Secondary | ICD-10-CM | POA: Diagnosis not present

## 2020-01-22 DIAGNOSIS — I10 Essential (primary) hypertension: Secondary | ICD-10-CM | POA: Diagnosis not present

## 2020-01-22 DIAGNOSIS — Z79899 Other long term (current) drug therapy: Secondary | ICD-10-CM | POA: Diagnosis not present

## 2020-01-23 ENCOUNTER — Encounter: Payer: Self-pay | Admitting: Family Medicine

## 2020-01-23 LAB — LIPID PANEL
Chol/HDL Ratio: 3.3 ratio (ref 0.0–5.0)
Cholesterol, Total: 91 mg/dL — ABNORMAL LOW (ref 100–199)
HDL: 28 mg/dL — ABNORMAL LOW (ref >39)
LDL Chol Calc (NIH): 41 mg/dL (ref 0–99)
Triglycerides: 117 mg/dL (ref 0–149)
VLDL Cholesterol Cal: 22 mg/dL (ref 5–40)

## 2020-01-23 LAB — BASIC METABOLIC PANEL
BUN/Creatinine Ratio: 15 (ref 10–24)
BUN: 13 mg/dL (ref 8–27)
CO2: 24 mmol/L (ref 20–29)
Calcium: 8.8 mg/dL (ref 8.6–10.2)
Chloride: 104 mmol/L (ref 96–106)
Creatinine, Ser: 0.89 mg/dL (ref 0.76–1.27)
GFR calc Af Amer: 92 mL/min/{1.73_m2} (ref >59)
GFR calc non Af Amer: 80 mL/min/{1.73_m2} (ref >59)
Glucose: 100 mg/dL — ABNORMAL HIGH (ref 65–99)
Potassium: 4.8 mmol/L (ref 3.5–5.2)
Sodium: 141 mmol/L (ref 134–144)

## 2020-01-23 LAB — HEPATIC FUNCTION PANEL
ALT: 12 IU/L (ref 0–44)
AST: 11 IU/L (ref 0–40)
Albumin: 3.6 g/dL (ref 3.6–4.6)
Alkaline Phosphatase: 75 IU/L (ref 44–121)
Bilirubin Total: 0.4 mg/dL (ref 0.0–1.2)
Bilirubin, Direct: 0.13 mg/dL (ref 0.00–0.40)
Total Protein: 6.1 g/dL (ref 6.0–8.5)

## 2020-02-13 ENCOUNTER — Other Ambulatory Visit: Payer: Self-pay

## 2020-02-13 ENCOUNTER — Telehealth: Payer: Self-pay

## 2020-02-13 ENCOUNTER — Ambulatory Visit (INDEPENDENT_AMBULATORY_CARE_PROVIDER_SITE_OTHER): Payer: PPO | Admitting: Family Medicine

## 2020-02-13 DIAGNOSIS — R059 Cough, unspecified: Secondary | ICD-10-CM

## 2020-02-13 MED ORDER — AMOXICILLIN 500 MG PO TABS: 500.0000 mg | ORAL_TABLET | Freq: Three times a day (TID) | ORAL | 0 refills | Status: DC

## 2020-02-13 MED ORDER — PREDNISONE 20 MG PO TABS
ORAL_TABLET | ORAL | 0 refills | Status: DC
Start: 2020-02-13 — End: 2020-02-21

## 2020-02-13 MED ORDER — ALBUTEROL SULFATE HFA 108 (90 BASE) MCG/ACT IN AERS
2.0000 | INHALATION_SPRAY | RESPIRATORY_TRACT | 2 refills | Status: DC | PRN
Start: 2020-02-13 — End: 2020-04-08

## 2020-02-13 NOTE — Telephone Encounter (Signed)
Error

## 2020-02-13 NOTE — Patient Instructions (Signed)
Ravon Your medications were sent into the pharmacy I would recommend amoxicillin 500 mg 1 pill 3 times daily for 10 days for your sinusitis. You may use your inhaler 2 puffs every 4 hours as needed Prednisone is 2 tablets daily for the next 5 days Should you start running high fevers severe shortness of breath or other worrisome symptoms please recheck here or emergency department right away Please call us if any questions or concerns or if you are not getting better over the next few days Thanks-Dr. Nicki Reaper     How to Use a Metered Dose Inhaler A metered dose inhaler is a handheld device for taking medicine that must be breathed into the lungs (inhaled). The device can be used to deliver a variety of inhaled medicines, including:  Quick relief or rescue medicines, such as bronchodilators.  Controller medicines, such as corticosteroids. The medicine is delivered by pushing down on a metal canister to release a preset amount of spray and medicine. Each device contains the amount of medicine that is needed for a preset number of uses (inhalations). Your health care provider may recommend that you use a spacer with your inhaler to help you take the medicine more effectively. A spacer is a plastic tube with a mouthpiece on one end and an opening that connects to the inhaler on the other end. A spacer holds the medicine in a tube for a short time, which allows you to inhale more medicine. What are the risks? If you do not use your inhaler correctly, medicine might not reach your lungs to help you breathe. Inhaler medicine can cause side effects, such as:  Mouth or throat infection.  Cough.  Hoarseness.  Headache.  Nausea and vomiting.  Lung infection (pneumonia) in people who have a lung condition called COPD. How to use a metered dose inhaler without a spacer  1. Remove the cap from the inhaler. 2. If you are using the inhaler for the first time, shake it for 5 seconds, turn it  away from your face, then release 4 puffs into the air. This is called priming. 3. Shake the inhaler for 5 seconds. 4. Position the inhaler so the top of the canister faces up. 5. Put your index finger on the top of the medicine canister. Support the bottom of the inhaler with your thumb. 6. Breathe out normally and as completely as possible, away from the inhaler. 7. Either place the inhaler between your teeth and close your lips tightly around the mouthpiece, or hold the inhaler 1-2 inches (2.5-5 cm) away from your open mouth. Keep your tongue down out of the way. If you are unsure which technique to use, ask your health care provider. 8. Press the canister down with your index finger to release the medicine, then inhale deeply and slowly through your mouth (not your nose) until your lungs are completely filled. Inhaling should take 4-6 seconds. 9. Hold the medicine in your lungs for 5-10 seconds (10 seconds is best). This helps the medicine get into the small airways of your lungs. 10. With your lips in a tight circle (pursed), breathe out slowly. 11. Repeat steps 3-10 until you have taken the number of puffs that your health care provider directed. Wait about 1 minute between puffs or as directed. 12. Put the cap on the inhaler. 13. If you are using a steroid inhaler, rinse your mouth with water, gargle, and spit out the water. Do not swallow the water. How to use a  metered dose inhaler with a spacer  1. Remove the cap from the inhaler. 2. If you are using the inhaler for the first time, shake it for 5 seconds, turn it away from your face, then release 4 puffs into the air. This is called priming. 3. Shake the inhaler for 5 seconds. 4. Place the open end of the spacer onto the inhaler mouthpiece. 5. Position the inhaler so the top of the canister faces up and the spacer mouthpiece faces you. 6. Put your index finger on the top of the medicine canister. Support the bottom of the inhaler and the  spacer with your thumb. 7. Breathe out normally and as completely as possible, away from the spacer. 8. Place the spacer between your teeth and close your lips tightly around it. Keep your tongue down out of the way. 9. Press the canister down with your index finger to release the medicine, then inhale deeply and slowly through your mouth (not your nose) until your lungs are completely filled. Inhaling should take 4-6 seconds. 10. Hold the medicine in your lungs for 5-10 seconds (10 seconds is best). This helps the medicine get into the small airways of your lungs. 11. With your lips in a tight circle (pursed), breathe out slowly. 12. Repeat steps 3-11 until you have taken the number of puffs that your health care provider directed. Wait about 1 minute between puffs or as directed. 13. Remove the spacer from the inhaler and put the cap on the inhaler. 14. If you are using a steroid inhaler, rinse your mouth with water, gargle, and spit out the water. Do not swallow the water. Follow these instructions at home:  Take your inhaled medicine only as told by your health care provider. Do not use the inhaler more than directed by your health care provider.  Keep all follow-up visits as told by your health care provider. This is important.  If your inhaler has a counter, you can check it to determine how full your inhaler is. If your inhaler does not have a counter, ask your health care provider when you will need to refill your inhaler and write the refill date on a calendar or on your inhaler canister. Note that you cannot know when an inhaler is empty by shaking it.  Follow directions on the package insert for care and cleaning of your inhaler and spacer. Contact a health care provider if:  Symptoms are only partially relieved with your inhaler.  You are having trouble using your inhaler.  You have an increase in phlegm.  You have headaches. Get help right away if:  You feel little or no  relief after using your inhaler.  You have dizziness.  You have a fast heart rate.  You have chills or a fever.  You have night sweats.  There is blood in your phlegm. Summary  A metered dose inhaler is a handheld device for taking medicine that must be breathed into the lungs (inhaled).  The medicine is delivered by pushing down on a metal canister to release a preset amount of spray and medicine.  Each device contains the amount of medicine that is needed for a preset number of uses (inhalations). This information is not intended to replace advice given to you by your health care provider. Make sure you discuss any questions you have with your health care provider. Document Revised: 02/26/2017 Document Reviewed: 02/04/2016 Elsevier Patient Education  2020 [employer].

## 2020-02-13 NOTE — Progress Notes (Signed)
   Subjective:    Patient ID: [name] [name], male    DOB: [birth year], 85 y.o.   MRN: [id number]  HPIpt states having sinus congestion for 2 - 3 weeks. Forgot to mention it when he was at his last visit. Now thinks congestion is getting in his chest. Some sob in mornings. Noticed some blood in nasal drainage last night when he blowed his nose. Has not had a covid test since symptoms started. Tried otc meds.   At times relates a little bit of chest congestion with difficulty taking a deep breath denies high fever chills sweats  Review of Systems Please see above    Objective:   Physical Exam  Lungs bronchioles are tight no crackles no respiratory distress HEENT benign      Assessment & Plan:  Acute rhinosinusitis Covid test taken Warning signs discussed Also believe that there is some bronchial component here I do not find evidence of pneumonia Prednisone 2 tablets daily for the next 5 days Antibiotic over the next 10 days Albuterol 2 puffs every 4 hours as needed

## 2020-02-15 LAB — NOVEL CORONAVIRUS, NAA: SARS-CoV-2, NAA: NOT DETECTED

## 2020-02-15 LAB — SARS-COV-2, NAA 2 DAY TAT

## 2020-02-15 LAB — SPECIMEN STATUS REPORT

## 2020-02-19 ENCOUNTER — Inpatient Hospital Stay (HOSPITAL_COMMUNITY)
Admission: EM | Admit: 2020-02-19 | Discharge: 2020-02-21 | DRG: 291 | Disposition: A | Discharge status: Home / Self Care | Payer: PPO | Attending: Internal Medicine | Admitting: Internal Medicine

## 2020-02-19 ENCOUNTER — Encounter: Payer: Self-pay | Admitting: Family Medicine

## 2020-02-19 ENCOUNTER — Other Ambulatory Visit: Payer: Self-pay

## 2020-02-19 ENCOUNTER — Ambulatory Visit (HOSPITAL_COMMUNITY)
Admission: RE | Admit: 2020-02-19 | Discharge: 2020-02-19 | Disposition: A | Discharge status: Home / Self Care | Payer: PPO | Source: Ambulatory Visit | Attending: Family Medicine | Admitting: Family Medicine

## 2020-02-19 ENCOUNTER — Encounter (HOSPITAL_COMMUNITY): Payer: Self-pay | Admitting: Emergency Medicine

## 2020-02-19 ENCOUNTER — Ambulatory Visit (INDEPENDENT_AMBULATORY_CARE_PROVIDER_SITE_OTHER): Payer: PPO | Admitting: Family Medicine

## 2020-02-19 ENCOUNTER — Emergency Department (HOSPITAL_COMMUNITY): Payer: PPO

## 2020-02-19 ENCOUNTER — Other Ambulatory Visit (HOSPITAL_COMMUNITY)
Admission: RE | Admit: 2020-02-19 | Discharge: 2020-02-19 | Disposition: A | Discharge status: Home / Self Care | Payer: PPO | Source: Ambulatory Visit | Attending: Family Medicine | Admitting: Family Medicine

## 2020-02-19 VITALS — HR 83 | Temp 97.7°F | Wt 181.0 lb

## 2020-02-19 DIAGNOSIS — R059 Cough, unspecified: Secondary | ICD-10-CM | POA: Insufficient documentation

## 2020-02-19 DIAGNOSIS — Z8673 Personal history of transient ischemic attack (TIA), and cerebral infarction without residual deficits: Secondary | ICD-10-CM | POA: Diagnosis not present

## 2020-02-19 DIAGNOSIS — Z955 Presence of coronary angioplasty implant and graft: Secondary | ICD-10-CM | POA: Diagnosis not present

## 2020-02-19 DIAGNOSIS — I739 Peripheral vascular disease, unspecified: Secondary | ICD-10-CM | POA: Diagnosis not present

## 2020-02-19 DIAGNOSIS — Z7902 Long term (current) use of antithrombotics/antiplatelets: Secondary | ICD-10-CM

## 2020-02-19 DIAGNOSIS — I10 Essential (primary) hypertension: Secondary | ICD-10-CM | POA: Diagnosis not present

## 2020-02-19 DIAGNOSIS — D6959 Other secondary thrombocytopenia: Secondary | ICD-10-CM | POA: Diagnosis not present

## 2020-02-19 DIAGNOSIS — R0789 Other chest pain: Secondary | ICD-10-CM

## 2020-02-19 DIAGNOSIS — I5032 Chronic diastolic (congestive) heart failure: Secondary | ICD-10-CM | POA: Insufficient documentation

## 2020-02-19 DIAGNOSIS — N400 Enlarged prostate without lower urinary tract symptoms: Secondary | ICD-10-CM | POA: Diagnosis present

## 2020-02-19 DIAGNOSIS — R0600 Dyspnea, unspecified: Secondary | ICD-10-CM | POA: Insufficient documentation

## 2020-02-19 DIAGNOSIS — I2510 Atherosclerotic heart disease of native coronary artery without angina pectoris: Secondary | ICD-10-CM

## 2020-02-19 DIAGNOSIS — D696 Thrombocytopenia, unspecified: Secondary | ICD-10-CM | POA: Diagnosis present

## 2020-02-19 DIAGNOSIS — I509 Heart failure, unspecified: Secondary | ICD-10-CM | POA: Diagnosis not present

## 2020-02-19 DIAGNOSIS — E785 Hyperlipidemia, unspecified: Secondary | ICD-10-CM | POA: Diagnosis present

## 2020-02-19 DIAGNOSIS — Z79899 Other long term (current) drug therapy: Secondary | ICD-10-CM

## 2020-02-19 DIAGNOSIS — J90 Pleural effusion, not elsewhere classified: Secondary | ICD-10-CM | POA: Diagnosis not present

## 2020-02-19 DIAGNOSIS — K219 Gastro-esophageal reflux disease without esophagitis: Secondary | ICD-10-CM | POA: Diagnosis not present

## 2020-02-19 DIAGNOSIS — I5033 Acute on chronic diastolic (congestive) heart failure: Secondary | ICD-10-CM | POA: Diagnosis present

## 2020-02-19 DIAGNOSIS — R079 Chest pain, unspecified: Secondary | ICD-10-CM | POA: Diagnosis not present

## 2020-02-19 DIAGNOSIS — I252 Old myocardial infarction: Secondary | ICD-10-CM

## 2020-02-19 DIAGNOSIS — R0609 Other forms of dyspnea: Secondary | ICD-10-CM | POA: Insufficient documentation

## 2020-02-19 DIAGNOSIS — I160 Hypertensive urgency: Secondary | ICD-10-CM | POA: Diagnosis not present

## 2020-02-19 DIAGNOSIS — R0602 Shortness of breath: Secondary | ICD-10-CM | POA: Insufficient documentation

## 2020-02-19 DIAGNOSIS — I5042 Chronic combined systolic (congestive) and diastolic (congestive) heart failure: Secondary | ICD-10-CM | POA: Insufficient documentation

## 2020-02-19 DIAGNOSIS — R21 Rash and other nonspecific skin eruption: Secondary | ICD-10-CM | POA: Diagnosis not present

## 2020-02-19 DIAGNOSIS — Z20822 Contact with and (suspected) exposure to covid-19: Secondary | ICD-10-CM | POA: Diagnosis not present

## 2020-02-19 DIAGNOSIS — J189 Pneumonia, unspecified organism: Secondary | ICD-10-CM | POA: Diagnosis not present

## 2020-02-19 DIAGNOSIS — Z7982 Long term (current) use of aspirin: Secondary | ICD-10-CM | POA: Diagnosis not present

## 2020-02-19 DIAGNOSIS — D649 Anemia, unspecified: Secondary | ICD-10-CM | POA: Diagnosis present

## 2020-02-19 DIAGNOSIS — I714 Abdominal aortic aneurysm, without rupture: Secondary | ICD-10-CM | POA: Diagnosis present

## 2020-02-19 DIAGNOSIS — I110 Hypertensive heart disease with heart failure: Secondary | ICD-10-CM | POA: Diagnosis not present

## 2020-02-19 DIAGNOSIS — Z9861 Coronary angioplasty status: Secondary | ICD-10-CM

## 2020-02-19 DIAGNOSIS — J9811 Atelectasis: Secondary | ICD-10-CM | POA: Diagnosis not present

## 2020-02-19 DIAGNOSIS — K449 Diaphragmatic hernia without obstruction or gangrene: Secondary | ICD-10-CM | POA: Diagnosis not present

## 2020-02-19 LAB — BASIC METABOLIC PANEL
Anion gap: 9 (ref 5–15)
Anion gap: 13 (ref 5–15)
BUN: 27 mg/dL — ABNORMAL HIGH (ref 8–23)
BUN: 24 mg/dL — ABNORMAL HIGH (ref 8–23)
CO2: 25 mmol/L (ref 22–32)
CO2: 24 mmol/L (ref 22–32)
Calcium: 8.4 mg/dL — ABNORMAL LOW (ref 8.9–10.3)
Calcium: 8.6 mg/dL — ABNORMAL LOW (ref 8.9–10.3)
Chloride: 103 mmol/L (ref 98–111)
Chloride: 103 mmol/L (ref 98–111)
Creatinine, Ser: 1.18 mg/dL (ref 0.61–1.24)
Creatinine, Ser: 1.15 mg/dL (ref 0.61–1.24)
GFR, Estimated: >60 mL/min (ref >60)
GFR, Estimated: >60 mL/min (ref >60)
Glucose, Bld: 110 mg/dL — ABNORMAL HIGH (ref 70–99)
Glucose, Bld: 143 mg/dL — ABNORMAL HIGH (ref 70–99)
Potassium: 4.2 mmol/L (ref 3.5–5.1)
Potassium: 4.1 mmol/L (ref 3.5–5.1)
Sodium: 137 mmol/L (ref 135–145)
Sodium: 140 mmol/L (ref 135–145)

## 2020-02-19 LAB — TROPONIN I (HIGH SENSITIVITY)
Troponin I (High Sensitivity): 20 ng/L — ABNORMAL HIGH (ref <18)
Troponin I (High Sensitivity): 22 ng/L — ABNORMAL HIGH (ref <18)
Troponin I (High Sensitivity): 22 ng/L — ABNORMAL HIGH (ref <18)

## 2020-02-19 LAB — CBC
HCT: 44.4 % (ref 39.0–52.0)
Hemoglobin: 13.9 g/dL (ref 13.0–17.0)
MCH: 28.5 pg (ref 26.0–34.0)
MCHC: 31.3 g/dL (ref 30.0–36.0)
MCV: 91 fL (ref 80.0–100.0)
Platelets: 118 10*3/uL — ABNORMAL LOW (ref 150–400)
RBC: 4.88 MIL/uL (ref 4.22–5.81)
RDW: 14.7 % (ref 11.5–15.5)
WBC: 9.1 10*3/uL (ref 4.0–10.5)
nRBC: 0 % (ref 0.0–0.2)

## 2020-02-19 LAB — RESPIRATORY PANEL BY RT PCR (FLU A&B, COVID)
Influenza A by PCR: NEGATIVE
Influenza B by PCR: NEGATIVE
SARS Coronavirus 2 by RT PCR: NEGATIVE

## 2020-02-19 LAB — D-DIMER, QUANTITATIVE: D-Dimer, Quant: 1.5 ug/mL-FEU — ABNORMAL HIGH (ref 0.00–0.50)

## 2020-02-19 LAB — BRAIN NATRIURETIC PEPTIDE: B Natriuretic Peptide: 871 pg/mL — ABNORMAL HIGH (ref 0.0–100.0)

## 2020-02-19 MED ORDER — METOPROLOL TARTRATE 50 MG PO TABS
50.0000 mg | ORAL_TABLET | Freq: Two times a day (BID) | ORAL | Status: DC
  Administered 2020-02-20 – 2020-02-21 (×3): 50 mg via ORAL
  Filled 2020-02-19 (×3): qty 1

## 2020-02-19 MED ORDER — ASPIRIN EC 81 MG PO TBEC
81.0000 mg | DELAYED_RELEASE_TABLET | Freq: Every day | ORAL | Status: DC
  Administered 2020-02-20: 81 mg via ORAL
  Filled 2020-02-19: qty 1

## 2020-02-19 MED ORDER — IOHEXOL 350 MG/ML SOLN
60.0000 mL | Freq: Once | INTRAVENOUS | Status: AC | PRN
  Administered 2020-02-19: 60 mL via INTRAVENOUS

## 2020-02-19 MED ORDER — ALBUTEROL SULFATE (2.5 MG/3ML) 0.083% IN NEBU: 2.5000 mg | INHALATION_SOLUTION | RESPIRATORY_TRACT | Status: DC | PRN

## 2020-02-19 MED ORDER — ENOXAPARIN SODIUM 40 MG/0.4ML ~~LOC~~ SOLN
40.0000 mg | SUBCUTANEOUS | Status: DC
  Administered 2020-02-20: 40 mg via SUBCUTANEOUS
  Filled 2020-02-19: qty 0.4

## 2020-02-19 MED ORDER — CLOPIDOGREL BISULFATE 75 MG PO TABS
75.0000 mg | ORAL_TABLET | Freq: Every day | ORAL | Status: DC
  Administered 2020-02-20 – 2020-02-21 (×2): 75 mg via ORAL
  Filled 2020-02-19 (×2): qty 1

## 2020-02-19 MED ORDER — SODIUM CHLORIDE 0.9% FLUSH
3.0000 mL | Freq: Two times a day (BID) | INTRAVENOUS | Status: DC
  Administered 2020-02-19 – 2020-02-21 (×3): 3 mL via INTRAVENOUS

## 2020-02-19 MED ORDER — NITROGLYCERIN 0.4 MG SL SUBL: 0.4000 mg | SUBLINGUAL_TABLET | SUBLINGUAL | Status: DC | PRN

## 2020-02-19 MED ORDER — ACETAMINOPHEN 650 MG RE SUPP: 650.0000 mg | Freq: Four times a day (QID) | RECTAL | Status: DC | PRN

## 2020-02-19 MED ORDER — HEPARIN SODIUM (PORCINE) 5000 UNIT/ML IJ SOLN
INTRAMUSCULAR | Status: AC
  Administered 2020-02-19: 5000 [IU]
  Filled 2020-02-19: qty 1

## 2020-02-19 MED ORDER — ATORVASTATIN CALCIUM 10 MG PO TABS
20.0000 mg | ORAL_TABLET | Freq: Every evening | ORAL | Status: DC
  Administered 2020-02-20: 20 mg via ORAL
  Filled 2020-02-19: qty 2

## 2020-02-19 MED ORDER — LISINOPRIL 20 MG PO TABS
20.0000 mg | ORAL_TABLET | Freq: Every day | ORAL | Status: DC
  Administered 2020-02-20: 20 mg via ORAL
  Filled 2020-02-19: qty 1

## 2020-02-19 MED ORDER — PANTOPRAZOLE SODIUM 20 MG PO TBEC
20.0000 mg | DELAYED_RELEASE_TABLET | Freq: Two times a day (BID) | ORAL | Status: DC
  Administered 2020-02-20 – 2020-02-21 (×4): 20 mg via ORAL
  Filled 2020-02-19 (×4): qty 1

## 2020-02-19 MED ORDER — ACETAMINOPHEN 325 MG PO TABS
650.0000 mg | ORAL_TABLET | Freq: Four times a day (QID) | ORAL | Status: DC | PRN
  Administered 2020-02-20: 650 mg via ORAL
  Filled 2020-02-19: qty 2

## 2020-02-19 MED ORDER — POLYVINYL ALCOHOL 1.4 % OP SOLN
1.0000 [drp] | OPHTHALMIC | Status: DC | PRN
  Filled 2020-02-19: qty 15

## 2020-02-19 MED ORDER — ASPIRIN 81 MG PO CHEW
CHEWABLE_TABLET | ORAL | Status: AC
  Administered 2020-02-19: 324 mg
  Filled 2020-02-19: qty 4

## 2020-02-19 MED ORDER — POLYETHYLENE GLYCOL 3350 17 G PO PACK: 17.0000 g | PACK | Freq: Every day | ORAL | Status: DC | PRN

## 2020-02-19 MED ORDER — FUROSEMIDE 10 MG/ML IJ SOLN
40.0000 mg | Freq: Two times a day (BID) | INTRAMUSCULAR | Status: DC
  Administered 2020-02-20 – 2020-02-21 (×3): 40 mg via INTRAVENOUS
  Filled 2020-02-19 (×3): qty 4

## 2020-02-19 MED ORDER — TAMSULOSIN HCL 0.4 MG PO CAPS
0.4000 mg | ORAL_CAPSULE | Freq: Every day | ORAL | Status: DC
  Administered 2020-02-20 – 2020-02-21 (×2): 0.4 mg via ORAL
  Filled 2020-02-19 (×2): qty 1

## 2020-02-19 MED ORDER — AMLODIPINE BESYLATE 10 MG PO TABS
10.0000 mg | ORAL_TABLET | Freq: Every day | ORAL | Status: DC
  Administered 2020-02-20 – 2020-02-21 (×2): 10 mg via ORAL
  Filled 2020-02-19 (×2): qty 1

## 2020-02-19 MED ORDER — FUROSEMIDE 10 MG/ML IJ SOLN
40.0000 mg | Freq: Once | INTRAMUSCULAR | Status: AC
  Administered 2020-02-19: 40 mg via INTRAVENOUS
  Filled 2020-02-19: qty 4

## 2020-02-19 MED ORDER — TRAMADOL HCL 50 MG PO TABS: 50.0000 mg | ORAL_TABLET | Freq: Three times a day (TID) | ORAL | Status: DC | PRN

## 2020-02-19 MED ORDER — NITROGLYCERIN IN D5W 200-5 MCG/ML-% IV SOLN
0.0000 ug/min | INTRAVENOUS | Status: DC
  Administered 2020-02-19: 5 ug/min via INTRAVENOUS
  Administered 2020-02-20: 45 ug/min via INTRAVENOUS
  Administered 2020-02-21: 50 ug/min via INTRAVENOUS
  Filled 2020-02-19 (×3): qty 250

## 2020-02-19 MED ORDER — PREDNISONE 20 MG PO TABS
40.0000 mg | ORAL_TABLET | Freq: Every day | ORAL | Status: AC
  Administered 2020-02-20: 40 mg via ORAL
  Filled 2020-02-19: qty 2

## 2020-02-19 NOTE — ED Triage Notes (Signed)
Patient sent from [facility] for admission , reports SOB with chest pain for 1 week , blood tests and x-ray done at [facility] , no cough or fever .

## 2020-02-19 NOTE — Progress Notes (Signed)
Subjective:    Patient ID: [name] [name], male    DOB: [birth year], 85 y.o.   MRN: [id number]  HPIcontinued cough and increased sob. Had covid test on 11/16 that was negative. Having some tightness in chest that started today.   Patient was seen on the 16th with several day history of head congestion drainage coughing.  Covid test was negative.  Patient relates with tightness when he took a deep breath.  He was placed on a short course of prednisone, antibiotics for sinus, albuterol.  Patient states he did okay until Saturday evening he started noticing some increased shortness of breath on Sunday it got worse he stated it was more so in the morning when he woke up.  He denied PND denied orthopnea.  Denied swelling in the legs.  He states over the weekend had some slight intermittent tightness in his chest when he would try to read.  Denied any true substernal pressure.  Denied high fevers chills and states he could not bring up any phlegm.  Has underlying heart disease issues and vascular disease issues      Review of Systems  Constitutional: Negative for activity change, chills and fever.  HENT: Positive for congestion. Negative for ear pain and rhinorrhea.   Eyes: Negative for discharge.  Respiratory: Positive for cough and shortness of breath. Negative for wheezing.   Cardiovascular: Negative for chest pain.  Gastrointestinal: Negative for nausea and vomiting.  Musculoskeletal: Negative for arthralgias.       Objective:   Physical Exam Neck no masses felt lungs minimal crackles in the base Heart regular extremities no edema skin warm dry EKG no acute changes compared to April 2021        Assessment & Plan:  1. DOE (dyspnea on exertion) Concerning for the possibility of underlying heart disease.  Patient does not want to go to the ER unless it is necessary.  We will go ahead and order chest x-ray lab work stat and await results - PR ELECTROCARDIOGRAM, COMPLETE - DG Chest  2 View - Basic metabolic panel - Brain natriuretic peptide - D-dimer, quantitative (not at [facility]) - Troponin I  2. SOB (shortness of breath) See above.  I do not find evidence of pneumonia on the current exam but we will need to do x-ray - PR ELECTROCARDIOGRAM, COMPLETE - DG Chest 2 View - Basic metabolic panel - Brain natriuretic peptide - D-dimer, quantitative (not at [facility]) - Troponin I  3. Chest tightness Given the symptoms I am concerned about the possibility of underlying angina issues.  We will check lab work and if abnormalities on these labs will refer him on to ER - PR ELECTROCARDIOGRAM, COMPLETE - DG Chest 2 View - Basic metabolic panel - Brain natriuretic peptide - D-dimer, quantitative (not at [facility]) - Troponin I  4. Cough I believe the underlying illness he had a week ago seems to be better negative Covid - PR ELECTROCARDIOGRAM, COMPLETE - DG Chest 2 View - Basic metabolic panel - Brain natriuretic peptide - D-dimer, quantitative (not at [facility]) - Troponin I  Addendum-his BNP came back significantly elevated.  D-dimer significantly elevated.  Troponin slightly elevated.  In addition to this chest x-ray shows interstitial edema and vascular congestion I believe he has underlying CHF that is developing and he needs to go to the ED ASAP.  Family member is with him and they will take him to [facility] ED Triage nurse was spoken with Family was spoken with Family agrees  to take him currently With D-dimer being elevated I cannot rule out the possibility of blood clots contributing to this  We will follow closely and provide close support and follow-up upon discharge Patient does have ongoing relationship with cardiology and they will be accessible if he should be admitted
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
## 2020-02-19 NOTE — ED Notes (Signed)
CALLED [facility] TO ACTIVATE STEMI PER DR. SHELDON

## 2020-02-20 ENCOUNTER — Inpatient Hospital Stay (HOSPITAL_COMMUNITY): Payer: PPO

## 2020-02-20 DIAGNOSIS — I160 Hypertensive urgency: Secondary | ICD-10-CM

## 2020-02-20 DIAGNOSIS — I5033 Acute on chronic diastolic (congestive) heart failure: Secondary | ICD-10-CM

## 2020-02-20 DIAGNOSIS — R0602 Shortness of breath: Secondary | ICD-10-CM

## 2020-02-20 LAB — CBC
HCT: 38.6 % — ABNORMAL LOW (ref 39.0–52.0)
Hemoglobin: 12.6 g/dL — ABNORMAL LOW (ref 13.0–17.0)
MCH: 28.9 pg (ref 26.0–34.0)
MCHC: 32.6 g/dL (ref 30.0–36.0)
MCV: 88.5 fL (ref 80.0–100.0)
Platelets: 115 10*3/uL — ABNORMAL LOW (ref 150–400)
RBC: 4.36 MIL/uL (ref 4.22–5.81)
RDW: 14.6 % (ref 11.5–15.5)
WBC: 8.9 10*3/uL (ref 4.0–10.5)
nRBC: 0 % (ref 0.0–0.2)

## 2020-02-20 LAB — BASIC METABOLIC PANEL
Anion gap: 15 (ref 5–15)
BUN: 25 mg/dL — ABNORMAL HIGH (ref 8–23)
CO2: 24 mmol/L (ref 22–32)
Calcium: 8.1 mg/dL — ABNORMAL LOW (ref 8.9–10.3)
Chloride: 99 mmol/L (ref 98–111)
Creatinine, Ser: 1.1 mg/dL (ref 0.61–1.24)
GFR, Estimated: >60 mL/min (ref >60)
Glucose, Bld: 107 mg/dL — ABNORMAL HIGH (ref 70–99)
Potassium: 4 mmol/L (ref 3.5–5.1)
Sodium: 138 mmol/L (ref 135–145)

## 2020-02-20 LAB — ECHOCARDIOGRAM COMPLETE
Area-P 1/2: 3.42 cm2
Height: 68 in
P 1/2 time: 825 msec
S' Lateral: 3.1 cm
Weight: 2797.2 oz

## 2020-02-20 MED ORDER — ENOXAPARIN SODIUM 40 MG/0.4ML ~~LOC~~ SOLN
40.0000 mg | Freq: Every day | SUBCUTANEOUS | Status: DC
  Administered 2020-02-21: 40 mg via SUBCUTANEOUS
  Filled 2020-02-20: qty 0.4

## 2020-02-20 MED ORDER — LISINOPRIL 40 MG PO TABS
40.0000 mg | ORAL_TABLET | Freq: Every day | ORAL | Status: DC
  Administered 2020-02-21: 40 mg via ORAL
  Filled 2020-02-20: qty 1

## 2020-02-20 MED ORDER — DM-GUAIFENESIN ER 30-600 MG PO TB12
1.0000 | ORAL_TABLET | Freq: Two times a day (BID) | ORAL | Status: DC
  Administered 2020-02-20 – 2020-02-21 (×2): 1 via ORAL
  Filled 2020-02-20 (×2): qty 1

## 2020-02-20 MED ORDER — HYDRALAZINE HCL 25 MG PO TABS: 25.0000 mg | ORAL_TABLET | ORAL | Status: DC | PRN

## 2020-02-20 MED ORDER — LABETALOL HCL 5 MG/ML IV SOLN: 10.0000 mg | INTRAVENOUS | Status: DC | PRN

## 2020-02-20 MED ORDER — LISINOPRIL 20 MG PO TABS
20.0000 mg | ORAL_TABLET | Freq: Once | ORAL | Status: AC
  Administered 2020-02-20: 20 mg via ORAL
  Filled 2020-02-20: qty 1

## 2020-02-20 NOTE — Assessment & Plan Note (Signed)
-  Continue aspirin, Lipitor, Plavix, lisinopril, Lopressor

## 2020-02-20 NOTE — Progress Notes (Signed)
Echocardiogram 2D Echocardiogram has been performed.  [name] [name] 02/20/2020, 8:44 AM

## 2020-02-20 NOTE — Assessment & Plan Note (Addendum)
-  See hypertensive urgency

## 2020-02-20 NOTE — Assessment & Plan Note (Addendum)
-  Systolic blood pressure greater than 200 on admission -Nitroglycerin drip started on admission, continue to taper as able; ultimately able to be titrated off -Continue amlodipine, lisinopril, Lopressor, Lasix -Hydralazine initiated prior to discharge as well

## 2020-02-20 NOTE — Assessment & Plan Note (Signed)
-  Continue Lipitor °

## 2020-02-20 NOTE — Hospital Course (Addendum)
Mr. Makris is an 85 yo male with PMH AAA, anemia, carotid artery disease, BPH, CAD status post STEMI and stents, CVA, claudication, hypertension, GERD, thrombocytopenia who presented with progressive shortness of breath, some intermittent chest pains.   Patient states that he has noticed some slowly increasing edema for about 3 months (but he had not reported this to his provider or family).  He has had about 3 days of slowly progressive shortness of breath worse with exertion.  He saw his PCP for this who had some suspicions for heart failure and checked a chest x-ray which showed mild congestion and edema as well as small bilateral effusions, BMP which was stable, BNP which showed elevation to 871, D-dimer which was mildly elevated at 1.5, and troponin which was 20.  Based on these findings patient was instructed to proceed to the ED for further work-up for heart failure.  Patient has been treated for a URI for the past week.  He denies any significant orthopnea.  He has had a more frequent cough.  He denies fever, tunnel pain, constipation, diarrhea.   ED Course: Vitals in ED significant for blood pressure initially elevated to the 100 200s which has improved to the 150s on nitro drip.  Lab work repeated which was similar to that in ED PCP office with creatinine of 1.18 from baseline of 0.9.  CBC only positive for PLT 118 similar to previous.  Respiratory panel negative for flu or Covid.  Repeat troponin flat at 22.  CTA ruled out PE.  On nitro drip as above and given first dose of IV Lasix 40 mg.  To starting to diurese when seen.  Of note, patient initially called as a STEMI based on initial EKG screen, but on reevaluation STEMI was canceled as this was similar to his previous EKG and troponins were stable.  His blood pressure was able to be slowly controlled on nitroglycerin drip (which was ultimately titrated off) and slow up titration of his BP regimen.  Prior to admission his lisinopril was  increased to 40 mg daily but he had not started taking it yet.  He also required addition of hydralazine during hospitalization which was continued at discharge.  This may need further up titration if blood pressure remains uncontrolled or if blood pressure does come down, this may be able to be discontinued.  Parameters placed on hydralazine prescription for patient education.  He underwent echo on admission which showed grade 2 diastolic dysfunction with normal EF, 50 to 55%.  He diuresed well with IV Lasix and was continued on oral Lasix at discharge which may also need further titration.  He will need a BMP check to ensure adequate electrolytes while on Lasix as well.

## 2020-02-20 NOTE — Assessment & Plan Note (Addendum)
-  Echo obtained on admission: EF 50 to 68%, grade 2 diastolic dysfunction -BNP 871 -CXR and CTA chest performed on admission.  No PE.  Small to moderate bilateral pleural effusions and bibasilar atelectasis -Place incentive spirometer bedside and educate patient -Continue Lasix; prescribed at discharge as well

## 2020-02-20 NOTE — Progress Notes (Signed)
PROGRESS NOTE    KHYREN HING   LFY:[id number]  DOB: 29-Dec-1934  DOA: 02/19/2020     1  PCP: Kathyrn Drown, MD  CC: SOB  Hospital Course: Mr. Guardia is an 85 yo male with PMH AAA, anemia, carotid artery disease, BPH, CAD status post STEMI and stents, CVA, claudication, hypertension, GERD, thrombocytopenia who presented with progressive shortness of breath, some intermittent chest pains.   Patient states that he has noticed some slowly increasing edema for about 3 months (but he had not reported this to his provider or family).  He has had about 3 days of slowly progressive shortness of breath worse with exertion.  He saw his PCP for this who had some suspicions for heart failure and checked a chest x-ray which showed mild congestion and edema as well as small bilateral effusions, BMP which was stable, BNP which showed elevation to 871, D-dimer which was mildly elevated at 1.5, and troponin which was 20.  Based on these findings patient was instructed to proceed to the ED for further work-up for heart failure.  Patient has been treated for a URI for the past week.  He denies any significant orthopnea.  He has had a more frequent cough.  He denies fever, tunnel pain, constipation, diarrhea.   ED Course: Vitals in ED significant for blood pressure initially elevated to the 100 200s which has improved to the 150s on nitro drip.  Lab work repeated which was similar to that in ED PCP office with creatinine of 1.18 from baseline of 0.9.  CBC only positive for PLT 118 similar to previous.  Respiratory panel negative for flu or Covid.  Repeat troponin flat at 22.  CTA ruled out PE.  On nitro drip as above and given first dose of IV Lasix 40 mg.  To starting to diurese when seen.  Of note, patient initially called as a STEMI based on initial EKG screen, but on reevaluation STEMI was canceled as this was similar to his previous EKG and troponins were stable.   Interval History:  Seen this  morning resting in bed more comfortable.  He notes improved breathing after Lasix and improvement of his blood pressure.  Wife also bedside and updated with questions answered.  Old records reviewed in assessment of this patient  ROS: Constitutional: negative for chills and fevers, Respiratory: positive for cough, Cardiovascular: negative for chest pain and Gastrointestinal: negative for abdominal pain  Assessment & Plan: * Acute on chronic diastolic CHF (congestive heart failure) ([facility]) -Echo obtained on admission: EF 50 to 85%, grade 2 diastolic dysfunction -BNP 871 -CXR and CTA chest performed on admission.  No PE.  Small to moderate bilateral pleural effusions and bibasilar atelectasis -Place incentive spirometer bedside and educate patient -Continue Lasix  Hypertensive urgency -Systolic blood pressure greater than 200 on admission -Nitroglycerin drip started on admission, continue to taper as able -Continue amlodipine, lisinopril, Lopressor, Lasix -Will initiate further agents as needed  Essential hypertension -See hypertensive urgency  BPH (benign prostatic hyperplasia) -Continue Flomax  CAD S/P percutaneous coronary angioplasty -Continue aspirin, Lipitor, Plavix, lisinopril, Lopressor  Peripheral vascular disease (HCC) -Continue aspirin and Plavix  Hyperlipidemia -Continue Lipitor    Antimicrobials: None  DVT prophylaxis: Lovenox Code Status: Full Family Communication: Wife bedside Disposition Plan: Status is: Inpatient  Remains inpatient appropriate because:Unsafe d/c plan, IV treatments appropriate due to intensity of illness or inability to take PO and Inpatient level of care appropriate due to severity of illness   Dispo:  The patient is from: Home              Anticipated d/c is to: Home              Anticipated d/c date is: 2 days              Patient currently is not medically stable to d/c.       Objective: Blood pressure (!) 141/73, pulse 64,  temperature 98.3 F (36.8 C), temperature source Oral, resp. rate 18, height 5\' 8"  (1.727 m), weight 79.3 kg, SpO2 95 %.  Examination: General appearance: alert, cooperative and no distress Head: Normocephalic, without obvious abnormality, atraumatic Eyes: EOMI Lungs: Coarse breath sounds bilaterally, no wheezing Heart: regular rate and rhythm and S1, S2 normal Abdomen: normal findings: bowel sounds normal and soft, non-tender Extremities: 1-2+ lower extremity edema Skin: mobility and turgor normal Neurologic: Grossly normal  Consultants:   None  Procedures:   None  Data Reviewed: I have personally reviewed following labs and imaging studies Results for orders placed or performed during the hospital encounter of 02/19/20 (from the past 24 hour(s))  Basic metabolic panel     Status: Abnormal   Collection Time: 02/19/20  7:48 PM  Result Value Ref Range   Sodium 140 135 - 145 mmol/L   Potassium 4.1 3.5 - 5.1 mmol/L   Chloride 103 98 - 111 mmol/L   CO2 24 22 - 32 mmol/L   Glucose, Bld 143 (H) 70 - 99 mg/dL   BUN 24 (H) 8 - 23 mg/dL   Creatinine, Ser 1.15 0.61 - 1.24 mg/dL   Calcium 8.6 (L) 8.9 - 10.3 mg/dL   GFR, Estimated >60 >60 mL/min   Anion gap 13 5 - 15  CBC     Status: Abnormal   Collection Time: 02/19/20  7:48 PM  Result Value Ref Range   WBC 9.1 4.0 - 10.5 K/uL   RBC 4.88 4.22 - 5.81 MIL/uL   Hemoglobin 13.9 13.0 - 17.0 g/dL   HCT 44.4 39 - 52 %   MCV 91.0 80.0 - 100.0 fL   MCH 28.5 26.0 - 34.0 pg   MCHC 31.3 30.0 - 36.0 g/dL   RDW 14.7 11.5 - 15.5 %   Platelets 118 (L) 150 - 400 K/uL   nRBC 0.0 0.0 - 0.2 %  Troponin I (High Sensitivity)     Status: Abnormal   Collection Time: 02/19/20  7:48 PM  Result Value Ref Range   Troponin I (High Sensitivity) 22 (H) <18 ng/L  Troponin I (High Sensitivity)     Status: Abnormal   Collection Time: 02/19/20  8:00 PM  Result Value Ref Range   Troponin I (High Sensitivity) 22 (H) <18 ng/L  Respiratory Panel by RT PCR  (Flu A&B, Covid) - Nasopharyngeal Swab     Status: None   Collection Time: 02/19/20  8:08 PM   Specimen: Nasopharyngeal Swab; Nasopharyngeal(NP) swabs in vial transport medium  Result Value Ref Range   SARS Coronavirus 2 by RT PCR NEGATIVE NEGATIVE   Influenza A by PCR NEGATIVE NEGATIVE   Influenza B by PCR NEGATIVE NEGATIVE  Basic metabolic panel     Status: Abnormal   Collection Time: 02/20/20  2:41 AM  Result Value Ref Range   Sodium 138 135 - 145 mmol/L   Potassium 4.0 3.5 - 5.1 mmol/L   Chloride 99 98 - 111 mmol/L   CO2 24 22 - 32 mmol/L   Glucose, Bld 107 (H)  70 - 99 mg/dL   BUN 25 (H) 8 - 23 mg/dL   Creatinine, Ser 1.10 0.61 - 1.24 mg/dL   Calcium 8.1 (L) 8.9 - 10.3 mg/dL   GFR, Estimated >60 >60 mL/min   Anion gap 15 5 - 15  CBC     Status: Abnormal   Collection Time: 02/20/20  2:41 AM  Result Value Ref Range   WBC 8.9 4.0 - 10.5 K/uL   RBC 4.36 4.22 - 5.81 MIL/uL   Hemoglobin 12.6 (L) 13.0 - 17.0 g/dL   HCT 38.6 (L) 39 - 52 %   MCV 88.5 80.0 - 100.0 fL   MCH 28.9 26.0 - 34.0 pg   MCHC 32.6 30.0 - 36.0 g/dL   RDW 14.6 11.5 - 15.5 %   Platelets 115 (L) 150 - 400 K/uL   nRBC 0.0 0.0 - 0.2 %    Recent Results (from the past 240 hour(s))  Novel Coronavirus, NAA ([facility])     Status: None   Collection Time: 02/13/20 12:00 AM   Specimen: Nasopharyngeal(NP) swabs in vial transport medium   Nasopharynge  Result Value Ref Range Status   SARS-CoV-2, NAA Not Detected Not Detected Final    Comment: This nucleic acid amplification test was developed and its performance characteristics determined by [employer]. Nucleic acid amplification tests include RT-PCR and TMA. This test has not been FDA cleared or approved. This test has been authorized by FDA under an Emergency Use Authorization (EUA). This test is only authorized for the duration of time the declaration that circumstances exist justifying the authorization of the emergency use of in vitro diagnostic tests  for detection of SARS-CoV-2 virus and/or diagnosis of COVID-19 infection under section 564(b)(1) of the Act, 21 U.S.C. 440NUU-7(O) (1), unless the authorization is terminated or revoked sooner. When diagnostic testing is negative, the possibility of a false negative result should be considered in the context of a patient's recent exposures and the presence of clinical signs and symptoms consistent with COVID-19. An individual without symptoms of COVID-19 and who is not shedding SARS-CoV-2 virus wo uld expect to have a negative (not detected) result in this assay.   SARS-COV-2, NAA 2 DAY TAT     Status: None   Collection Time: 02/13/20 12:00 AM   Nasopharynge  Result Value Ref Range Status   SARS-CoV-2, NAA 2 DAY TAT Performed  Final  Respiratory Panel by RT PCR (Flu A&B, Covid) - Nasopharyngeal Swab     Status: None   Collection Time: 02/19/20  8:08 PM   Specimen: Nasopharyngeal Swab; Nasopharyngeal(NP) swabs in vial transport medium  Result Value Ref Range Status   SARS Coronavirus 2 by RT PCR NEGATIVE NEGATIVE Final    Comment: (NOTE) SARS-CoV-2 target nucleic acids are NOT DETECTED.  The SARS-CoV-2 RNA is generally detectable in upper respiratoy specimens during the acute phase of infection. The lowest concentration of SARS-CoV-2 viral copies this assay can detect is 131 copies/mL. A negative result does not preclude SARS-Cov-2 infection and should not be used as the sole basis for treatment or other patient management decisions. A negative result may occur with  improper specimen collection/handling, submission of specimen other than nasopharyngeal swab, presence of viral mutation(s) within the areas targeted by this assay, and inadequate number of viral copies (<131 copies/mL). A negative result must be combined with clinical observations, patient history, and epidemiological information. The expected result is Negative.  Fact Sheet for Patients:    PinkCheek.be  Fact Sheet for Healthcare Providers:  GravelBags.it  This test is no t yet approved or cleared by the [place] and  has been authorized for detection and/or diagnosis of SARS-CoV-2 by FDA under an Emergency Use Authorization (EUA). This EUA will remain  in effect (meaning this test can be used) for the duration of the COVID-19 declaration under Section 564(b)(1) of the Act, 21 U.S.C. section 360bbb-3(b)(1), unless the authorization is terminated or revoked sooner.     Influenza A by PCR NEGATIVE NEGATIVE Final   Influenza B by PCR NEGATIVE NEGATIVE Final    Comment: (NOTE) The Xpert Xpress SARS-CoV-2/FLU/RSV assay is intended as an aid in  the diagnosis of influenza from Nasopharyngeal swab specimens and  should not be used as a sole basis for treatment. Nasal washings and  aspirates are unacceptable for Xpert Xpress SARS-CoV-2/FLU/RSV  testing.  Fact Sheet for Patients: PinkCheek.be  Fact Sheet for Healthcare Providers: GravelBags.it  This test is not yet approved or cleared by the [place] FDA and  has been authorized for detection and/or diagnosis of SARS-CoV-2 by  FDA under an Emergency Use Authorization (EUA). This EUA will remain  in effect (meaning this test can be used) for the duration of the  Covid-19 declaration under Section 564(b)(1) of the Act, 21  U.S.C. section 360bbb-3(b)(1), unless the authorization is  terminated or revoked. Performed at [facility], [place] [address], [place], [place] [address]      Radiology Studies: DG Chest 2 View  Result Date: 02/19/2020 CLINICAL DATA:  85 year old male with chest pain and shortness of breath EXAM: CHEST - 2 VIEW COMPARISON:  Chest radiograph dated 03/26/2014 FINDINGS: There is mild vascular congestion and mild diffuse interstitial prominence, likely edema. There are  small bilateral pleural effusions with minimal bibasilar atelectasis. Pneumonia is not excluded. No pneumothorax. The cardiac silhouette is within limits. Atherosclerotic calcification of the aorta. Coronary vascular stent. No acute osseous pathology. Degenerative changes of the spine and shoulders. IMPRESSION: 1. Mild vascular congestion and interstitial edema. 2. Small bilateral pleural effusions with minimal bibasilar atelectasis. Electronically Signed   By: Anner Crete M.D.   On: 02/19/2020 17:06   CT Angio Chest PE W/Cm &/Or Wo Cm  Result Date: 02/19/2020 CLINICAL DATA:  Elevated D-dimer. EXAM: CT ANGIOGRAPHY CHEST WITH CONTRAST TECHNIQUE: Multidetector CT imaging of the chest was performed using the standard protocol during bolus administration of intravenous contrast. Multiplanar CT image reconstructions and MIPs were obtained to evaluate the vascular anatomy. CONTRAST:  63mL OMNIPAQUE IOHEXOL 350 MG/ML SOLN COMPARISON:  None. FINDINGS: Cardiovascular: There is marked severity calcification of the thoracic aorta. Satisfactory opacification of the pulmonary arteries to the segmental level. No evidence of pulmonary embolism. Normal heart size with marked severity coronary artery calcification. No pericardial effusion. Mediastinum/Nodes: No enlarged mediastinal, hilar, or axillary lymph nodes. Thyroid gland, trachea, and esophagus demonstrate no significant findings. Lungs/Pleura: Mild areas of atelectasis and/or infiltrate are seen within the bilateral lower lobes. Small to moderate size bilateral pleural effusions are seen, right greater than left. No pneumothorax is identified. Upper Abdomen: There is a large hiatal hernia. Musculoskeletal: Multilevel degenerative changes seen throughout the thoracic spine Review of the MIP images confirms the above findings. IMPRESSION: 1. No evidence of pulmonary embolus. 2. Mild bilateral lower lobe atelectasis and/or infiltrate. 3. Small to moderate size  bilateral pleural effusions, right greater than left. 4. Large hiatal hernia. Aortic Atherosclerosis (ICD10-I70.0). Electronically Signed   By: Virgina Norfolk M.D.   On: 02/19/2020 21:16   ECHOCARDIOGRAM  COMPLETE  Result Date: 02/20/2020    ECHOCARDIOGRAM REPORT   Patient Name:   PRISCILLA FINKLEA Date of Exam: 02/20/2020 Medical Rec #:  [id number]         Height:       68.0 in Accession #:    [id number]        Weight:       174.8 lb Date of Birth:  09-Jun-1934        BSA:          1.930 m Patient Age:    57 years          BP:           150/98 mmHg Patient Gender: M                 HR:           64 bpm. Exam Location:  Inpatient Procedure: 2D Echo, Cardiac Doppler and Color Doppler Indications:    Dyspnea 786.09 / R06.00  History:        Patient has prior history of Echocardiogram examinations, most                 recent 04/03/2014. Previous Myocardial Infarction and CAD, Stroke,                 Signs/Symptoms:Dyspnea; Risk Factors:Hypertension and                 Dyslipidemia.  Sonographer:    Bernadene Person RDCS Referring Phys: [id number] [place]  1. Left ventricular ejection fraction, by estimation, is 50 to 55%. The left ventricle has low normal function. The left ventricle demonstrates regional wall motion abnormalities (see scoring diagram/findings for description). Left ventricular diastolic  parameters are consistent with Grade II diastolic dysfunction (pseudonormalization). Elevated left atrial pressure. There is mild hypokinesis of the left ventricular, basal inferoseptal wall and inferior wall.  2. Right ventricular systolic function is normal. The right ventricular size is normal. There is moderately elevated pulmonary artery systolic pressure. The estimated right ventricular systolic pressure is 63.7 mmHg.  3. Left atrial size was severely dilated.  4. The pericardial effusion is anterior to the right ventricle and circumferential. There is no evidence of cardiac tamponade.  5.  The mitral valve is normal in structure. Trivial mitral valve regurgitation.  6. The aortic valve is tricuspid. Aortic valve regurgitation is trivial. Mild aortic valve sclerosis is present, with no evidence of aortic valve stenosis.  7. The inferior vena cava is normal in size with greater than 50% respiratory variability, suggesting right atrial pressure of 3 mmHg. Comparison(s): Prior images unable to be directly viewed, comparison made by report only. FINDINGS  Left Ventricle: Left ventricular ejection fraction, by estimation, is 50 to 55%. The left ventricle has low normal function. The left ventricle demonstrates regional wall motion abnormalities. Mild hypokinesis of the left ventricular, basal inferoseptal  wall and inferior wall. The left ventricular internal cavity size was normal in size. There is no left ventricular hypertrophy. Left ventricular diastolic parameters are consistent with Grade II diastolic dysfunction (pseudonormalization). Elevated left  atrial pressure. Right Ventricle: The right ventricular size is normal. No increase in right ventricular wall thickness. Right ventricular systolic function is normal. There is moderately elevated pulmonary artery systolic pressure. The tricuspid regurgitant velocity is 3.41 m/s, and with an assumed right atrial pressure of 3 mmHg, the estimated right ventricular systolic pressure is 85.8 mmHg. Left Atrium: Left atrial size was severely dilated.  Right Atrium: Right atrial size was normal in size. Pericardium: Trivial pericardial effusion is present. The pericardial effusion is anterior to the right ventricle and circumferential. There is no evidence of cardiac tamponade. Mitral Valve: The mitral valve is normal in structure. Mild mitral annular calcification. Trivial mitral valve regurgitation. Tricuspid Valve: The tricuspid valve is normal in structure. Tricuspid valve regurgitation is trivial. Aortic Valve: The aortic valve is tricuspid. Aortic valve  regurgitation is trivial. Aortic regurgitation PHT measures 825 msec. Mild aortic valve sclerosis is present, with no evidence of aortic valve stenosis. Pulmonic Valve: The pulmonic valve was grossly normal. Pulmonic valve regurgitation is not visualized. Aorta: The aortic root and ascending aorta are structurally normal, with no evidence of dilitation. Venous: The inferior vena cava is normal in size with greater than 50% respiratory variability, suggesting right atrial pressure of 3 mmHg. IAS/Shunts: No atrial level shunt detected by color flow Doppler.  LEFT VENTRICLE PLAX 2D LVIDd:         4.20 cm  Diastology LVIDs:         3.10 cm  LV e' medial:    3.81 cm/s LV PW:         1.10 cm  LV E/e' medial:  28.9 LV IVS:        1.10 cm  LV e' lateral:   6.73 cm/s LVOT diam:     2.00 cm  LV E/e' lateral: 16.3 LV SV:         59 LV SV Index:   31 LVOT Area:     3.14 cm  RIGHT VENTRICLE RV S prime:     11.40 cm/s TAPSE (M-mode): 2.0 cm LEFT ATRIUM              Index       RIGHT ATRIUM           Index LA diam:        4.90 cm  2.54 cm/m  RA Area:     16.70 cm LA Vol (A2C):   82.1 ml  42.54 ml/m RA Volume:   38.30 ml  19.84 ml/m LA Vol (A4C):   131.0 ml 67.87 ml/m LA Biplane Vol: 106.0 ml 54.92 ml/m  AORTIC VALVE LVOT Vmax:   75.20 cm/s LVOT Vmean:  59.400 cm/s LVOT VTI:    0.188 m AI PHT:      825 msec  AORTA Ao Root diam: 3.60 cm Ao Asc diam:  3.50 cm MITRAL VALVE                TRICUSPID VALVE MV Area (PHT): 3.42 cm     TR Peak grad:   46.5 mmHg MV Decel Time: 222 msec     TR Vmax:        341.00 cm/s MV E velocity: 110.00 cm/s MV A velocity: 113.00 cm/s  SHUNTS MV E/A ratio:  0.97         Systemic VTI:  0.19 m                             Systemic Diam: 2.00 cm Dani Gobble Croitoru MD Electronically signed by Sanda Klein MD Signature Date/Time: 02/20/2020/10:01:17 AM    Final    CT Angio Chest PE W/Cm &/Or Wo Cm  Final Result      Scheduled Meds: . amLODipine  10 mg Oral Daily  . aspirin EC  81 mg Oral QHS  .  atorvastatin  20 mg  Oral QPM  . clopidogrel  75 mg Oral Daily  . dextromethorphan-guaiFENesin  1 tablet Oral BID  . enoxaparin (LOVENOX) injection  40 mg Subcutaneous Q24H  . furosemide  40 mg Intravenous BID  . [START ON 02/21/2020] lisinopril  40 mg Oral Daily  . metoprolol tartrate  50 mg Oral BID  . pantoprazole  20 mg Oral BID  . sodium chloride flush  3 mL Intravenous Q12H  . tamsulosin  0.4 mg Oral Daily   PRN Meds: acetaminophen **OR** acetaminophen, albuterol, nitroGLYCERIN, polyethylene glycol, polyvinyl alcohol, traMADol Continuous Infusions: . nitroGLYCERIN 43.333 mcg/min (02/20/20 1401)     LOS: 1 day  Time spent: Greater than 50% of the 35 minute visit was spent in counseling/coordination of care for the patient as laid out in the A&P.   Dwyane Dee, MD [facility] 02/20/2020, 4:47 PM

## 2020-02-20 NOTE — Assessment & Plan Note (Signed)
-  Continue aspirin and Plavix

## 2020-02-20 NOTE — Plan of Care (Signed)
  Problem: Education: Goal: Knowledge of General Education information will improve Description: Including pain rating scale, medication(s)/side effects and non-pharmacologic comfort measures Outcome: Progressing   Problem: Health Behavior/Discharge Planning: Goal: Ability to manage health-related needs will improve Outcome: Progressing   Problem: Clinical Measurements: Goal: Ability to maintain clinical measurements within normal limits will improve Outcome: Progressing Goal: Will remain free from infection Outcome: Progressing Goal: Diagnostic test results will improve Outcome: Progressing Goal: Respiratory complications will improve Outcome: Progressing Goal: Cardiovascular complication will be avoided Outcome: Progressing   Problem: Activity: Goal: Risk for activity intolerance will decrease Outcome: Progressing   Problem: Nutrition: Goal: Adequate nutrition will be maintained Outcome: Progressing   Problem: Coping: Goal: Level of anxiety will decrease Outcome: Progressing   Problem: Elimination: Goal: Will not experience complications related to bowel motility Outcome: Progressing Goal: Will not experience complications related to urinary retention Outcome: Progressing   Problem: Pain Managment: Goal: General experience of comfort will improve Outcome: Progressing   Problem: Safety: Goal: Ability to remain free from injury will improve Outcome: Progressing   Problem: Skin Integrity: Goal: Risk for impaired skin integrity will decrease Outcome: Progressing   Problem: Education: Goal: Understanding of cardiac disease, CV risk reduction, and recovery process will improve Outcome: Progressing Goal: Individualized Educational Video(s) Outcome: Progressing   Problem: Activity: Goal: Ability to tolerate increased activity will improve Outcome: Progressing   Problem: Cardiac: Goal: Ability to achieve and maintain adequate cardiovascular perfusion will  improve Outcome: Progressing   Problem: Health Behavior/Discharge Planning: Goal: Ability to safely manage health-related needs after discharge will improve Outcome: Progressing   Problem: Education: Goal: Ability to demonstrate management of disease process will improve Outcome: Progressing Goal: Ability to verbalize understanding of medication therapies will improve Outcome: Progressing   Problem: Activity: Goal: Capacity to carry out activities will improve Outcome: Progressing   Problem: Cardiac: Goal: Ability to achieve and maintain adequate cardiopulmonary perfusion will improve Outcome: Progressing

## 2020-02-20 NOTE — Assessment & Plan Note (Signed)
-   Continue Flomax 

## 2020-02-21 LAB — CBC WITH DIFFERENTIAL/PLATELET
Abs Immature Granulocytes: 0.06 10*3/uL (ref 0.00–0.07)
Basophils Absolute: 0 10*3/uL (ref 0.0–0.1)
Basophils Relative: 0 %
Eosinophils Absolute: 0.1 10*3/uL (ref 0.0–0.5)
Eosinophils Relative: 1 %
HCT: 39.1 % (ref 39.0–52.0)
Hemoglobin: 12.8 g/dL — ABNORMAL LOW (ref 13.0–17.0)
Immature Granulocytes: 0 %
Lymphocytes Relative: 9 %
Lymphs Abs: 1.2 10*3/uL (ref 0.7–4.0)
MCH: 28.6 pg (ref 26.0–34.0)
MCHC: 32.7 g/dL (ref 30.0–36.0)
MCV: 87.3 fL (ref 80.0–100.0)
Monocytes Absolute: 1.2 10*3/uL — ABNORMAL HIGH (ref 0.1–1.0)
Monocytes Relative: 9 %
Neutro Abs: 11.2 10*3/uL — ABNORMAL HIGH (ref 1.7–7.7)
Neutrophils Relative %: 81 %
Platelets: 119 10*3/uL — ABNORMAL LOW (ref 150–400)
RBC: 4.48 MIL/uL (ref 4.22–5.81)
RDW: 14.4 % (ref 11.5–15.5)
WBC: 13.7 10*3/uL — ABNORMAL HIGH (ref 4.0–10.5)
nRBC: 0 % (ref 0.0–0.2)

## 2020-02-21 LAB — BASIC METABOLIC PANEL
Anion gap: 13 (ref 5–15)
BUN: 30 mg/dL — ABNORMAL HIGH (ref 8–23)
CO2: 26 mmol/L (ref 22–32)
Calcium: 8.6 mg/dL — ABNORMAL LOW (ref 8.9–10.3)
Chloride: 98 mmol/L (ref 98–111)
Creatinine, Ser: 1.28 mg/dL — ABNORMAL HIGH (ref 0.61–1.24)
GFR, Estimated: 56 mL/min — ABNORMAL LOW (ref >60)
Glucose, Bld: 125 mg/dL — ABNORMAL HIGH (ref 70–99)
Potassium: 4.1 mmol/L (ref 3.5–5.1)
Sodium: 137 mmol/L (ref 135–145)

## 2020-02-21 LAB — MAGNESIUM: Magnesium: 2.3 mg/dL (ref 1.7–2.4)

## 2020-02-21 MED ORDER — HYDRALAZINE HCL 25 MG PO TABS
25.0000 mg | ORAL_TABLET | Freq: Three times a day (TID) | ORAL | 3 refills | Status: DC
Start: 2020-02-21 — End: 2020-04-19

## 2020-02-21 MED ORDER — HYDRALAZINE HCL 25 MG PO TABS
25.0000 mg | ORAL_TABLET | Freq: Three times a day (TID) | ORAL | Status: DC
  Administered 2020-02-21: 25 mg via ORAL
  Filled 2020-02-21: qty 1

## 2020-02-21 MED ORDER — FUROSEMIDE 20 MG PO TABS: 20.0000 mg | ORAL_TABLET | Freq: Every day | ORAL | 3 refills | Status: DC

## 2020-02-21 NOTE — Progress Notes (Signed)
D/C instructions given and reviewed. Questions asked and answered but encouraged to call with any further concerns. Tele and IV's removed, tolerated well.

## 2020-02-21 NOTE — Discharge Summary (Signed)
Physician Discharge Summary   [name] [name] YIR:[id number] DOB: 29-Apr-1934 DOA: 02/19/2020  PCP: Kathyrn Drown, MD  Admit date: 02/19/2020 Discharge date: 02/21/2020  Admitted From: home Disposition:  home Discharging physician: Dwyane Dee, MD  Recommendations for Outpatient Follow-up:  1. Repeat BMP as Lasix was prescribed at discharge 2. Titrate blood pressure regimen as needed.  Patient started on hydralazine at discharge.  Also continued on amlodipine, lisinopril, Lopressor, Lasix   Patient discharged to home in Discharge Condition: stable CODE STATUS: Full Diet recommendation:  Diet Orders (From admission, onward)    Start     Ordered   02/21/20 0000  Diet - low sodium heart healthy        02/21/20 1436   02/19/20 2154  Diet Heart Room service appropriate? Yes; Fluid consistency: Thin; Fluid restriction: 1500 mL Fluid  Diet effective now       Question Answer Comment  Room service appropriate? Yes   Fluid consistency: Thin   Fluid restriction: 1500 mL Fluid      02/19/20 2155          Hospital Course: [name] [name] is an 85 yo male with PMH AAA, anemia, carotid artery disease, BPH, CAD status post STEMI and stents, CVA, claudication, hypertension, GERD, thrombocytopenia who presented with progressive shortness of breath, some intermittent chest pains.   Patient states that he has noticed some slowly increasing edema for about 3 months (but he had not reported this to his provider or family).  He has had about 3 days of slowly progressive shortness of breath worse with exertion.  He saw his PCP for this who had some suspicions for heart failure and checked a chest x-ray which showed mild congestion and edema as well as small bilateral effusions, BMP which was stable, BNP which showed elevation to 871, D-dimer which was mildly elevated at 1.5, and troponin which was 20.  Based on these findings patient was instructed to proceed to the ED for further work-up for  heart failure.  Patient has been treated for a URI for the past week.  He denies any significant orthopnea.  He has had a more frequent cough.  He denies fever, tunnel pain, constipation, diarrhea.   ED Course: Vitals in ED significant for blood pressure initially elevated to the 100 200s which has improved to the 150s on nitro drip.  Lab work repeated which was similar to that in ED PCP office with creatinine of 1.18 from baseline of 0.9.  CBC only positive for PLT 118 similar to previous.  Respiratory panel negative for flu or Covid.  Repeat troponin flat at 22.  CTA ruled out PE.  On nitro drip as above and given first dose of IV Lasix 40 mg.  To starting to diurese when seen.  Of note, patient initially called as a STEMI based on initial EKG screen, but on reevaluation STEMI was canceled as this was similar to his previous EKG and troponins were stable.  His blood pressure was able to be slowly controlled on nitroglycerin drip (which was ultimately titrated off) and slow up titration of his BP regimen.  Prior to admission his lisinopril was increased to 40 mg daily but he had not started taking it yet.  He also required addition of hydralazine during hospitalization which was continued at discharge.  This may need further up titration if blood pressure remains uncontrolled or if blood pressure does come down, this may be able to be discontinued.  Parameters placed on hydralazine  prescription for patient education.  He underwent echo on admission which showed grade 2 diastolic dysfunction with normal EF, 50 to 55%.  He diuresed well with IV Lasix and was continued on oral Lasix at discharge which may also need further titration.  He will need a BMP check to ensure adequate electrolytes while on Lasix as well.   * Acute on chronic diastolic CHF (congestive heart failure) (HCC) -Echo obtained on admission: EF 50 to 01%, grade 2 diastolic dysfunction -BNP 871 -CXR and CTA chest performed on admission.   No PE.  Small to moderate bilateral pleural effusions and bibasilar atelectasis -Place incentive spirometer bedside and educate patient -Continue Lasix; prescribed at discharge as well  Hypertensive urgency-resolved as of 75/12/2583 -Systolic blood pressure greater than 200 on admission -Nitroglycerin drip started on admission, continue to taper as able; ultimately able to be titrated off -Continue amlodipine, lisinopril, Lopressor, Lasix -Hydralazine initiated prior to discharge as well  Essential hypertension -See hypertensive urgency  BPH (benign prostatic hyperplasia) -Continue Flomax  CAD S/P percutaneous coronary angioplasty -Continue aspirin, Lipitor, Plavix, lisinopril, Lopressor  Peripheral vascular disease (HCC) -Continue aspirin and Plavix  Hyperlipidemia -Continue Lipitor    The patient's chronic medical conditions were treated accordingly per the patient's home medication regimen except as noted.  On day of discharge, patient was felt deemed stable for discharge. Patient/family member advised to call PCP or come back to ER if needed.   Principal Diagnosis: Acute on chronic diastolic CHF (congestive heart failure) ([place])  Discharge Diagnoses: Active Hospital Problems   Diagnosis Date Noted  . Acute on chronic diastolic CHF (congestive heart failure) ([place]) 02/19/2020    Priority: High  . Thrombocytopenia ([place]) 03/21/2018  . Essential hypertension 04/12/2014  . BPH (benign prostatic hyperplasia) 11/28/2013  . CAD S/P percutaneous coronary angioplasty 07/25/2013  . Peripheral vascular disease ([place]) 07/02/2013  . Hyperlipidemia 09/06/2012    Resolved Hospital Problems   Diagnosis Date Noted Date Resolved  . Hypertensive urgency 02/20/2020 02/21/2020    Priority: High    Discharge Instructions    Diet - low sodium heart healthy   Complete by: As directed    Increase activity slowly   Complete by: As directed      Allergies as of 02/21/2020      Reactions    Brilinta [ticagrelor] Shortness Of Breath, Other (See Comments)   VERTIGO, also   Influenza Vaccines Other (See Comments)   Pt states he has been hospitalized both times he was given flu vaccine as a younger adult while in the [employer] and they told him not to take it again   Lipitor [atorvastatin] Other (See Comments)   JOINT PAIN- tolerates if taking CO-Q 10   Fluoride Preparations Other (See Comments)   Reaction not recalled      Medication List    STOP taking these medications   predniSONE 20 MG tablet Commonly known as: DELTASONE     TAKE these medications   acetaminophen 500 MG tablet Commonly known as: TYLENOL Take 500 mg by mouth every 6 (six) hours as needed for mild pain or moderate pain.   albuterol 108 (90 Base) MCG/ACT inhaler Commonly known as: VENTOLIN HFA Inhale 2 puffs into the lungs every 4 (four) hours as needed for wheezing. What changed: when to take this   amLODipine 10 MG tablet Commonly known as: NORVASC Take 1 tablet (10 mg total) by mouth daily.   aspirin EC 81 MG tablet Take 81 mg by mouth at bedtime.  atorvastatin 20 MG tablet Commonly known as: LIPITOR TAKE 1 TABLET(20 MG) BY MOUTH DAILY What changed: See the new instructions.   clopidogrel 75 MG tablet Commonly known as: PLAVIX Take 1 tablet (75 mg total) by mouth daily.   CoQ10 200 MG Caps Take 200 mg by mouth daily.   furosemide 20 MG tablet Commonly known as: Lasix Take 1 tablet (20 mg total) by mouth daily.   hydrALAZINE 25 MG tablet Commonly known as: APRESOLINE Take 1 tablet (25 mg total) by mouth 3 (three) times daily. Do not take if blood pressure less than 90/60   lisinopril 40 MG tablet Commonly known as: ZESTRIL Take 1 tablet (40 mg total) by mouth daily. What changed: Another medication with the same name was removed. Continue taking this medication, and follow the directions you see here.   methylcellulose 1 % ophthalmic solution Commonly known as: ARTIFICIAL  TEARS Place 1 drop into both eyes as needed (for dry eyes).   metoprolol tartrate 50 MG tablet Commonly known as: LOPRESSOR Take 1 tablet (50 mg total) by mouth 2 (two) times daily.   nitroGLYCERIN 0.4 MG SL tablet Commonly known as: NITROSTAT Place 1 tablet (0.4 mg total) under the tongue every 5 (five) minutes as needed for chest pain.   pantoprazole 20 MG tablet Commonly known as: PROTONIX Take 1 tablet (20 mg total) by mouth 2 (two) times daily.   tamsulosin 0.4 MG Caps capsule Commonly known as: FLOMAX TAKE 1 CAPSULE(0.4 MG) BY MOUTH DAILY What changed: See the new instructions.   traMADol 50 MG tablet Commonly known as: ULTRAM TAKE 1 TABLET BY MOUTH THREE TIMES DAILY AS NEEDED FOR MODERATE PAIN What changed:   how much to take  how to take this  when to take this  reasons to take this  additional instructions       Follow-up Information    Luking, Elayne Snare, MD. Schedule an appointment as soon as possible for a visit in 1 week(s).   Specialty: Family Medicine Contact information: [place] [id number] [phone]        Lorretta Harp, MD .   Specialties: Cardiology, Radiology Contact information: [address] [address] [place] [address] [phone]              Allergies  Allergen Reactions  . Brilinta [Ticagrelor] Shortness Of Breath and Other (See Comments)    VERTIGO, also  . Influenza Vaccines Other (See Comments)    Pt states he has been hospitalized both times he was given flu vaccine as a younger adult while in the [place] and they told him not to take it again  . Lipitor [Atorvastatin] Other (See Comments)    JOINT PAIN- tolerates if taking CO-Q 10  . Fluoride Preparations Other (See Comments)    Reaction not recalled    Consultations: None  Discharge Exam: BP 125/77 (BP Location: Left Arm)   Pulse 63   Temp 98.6 F (37 C) (Oral)   Resp (!) 21   Ht 5\' 8"  (1.727 m)   Wt 75.6 kg   SpO2  97%   BMI 25.35 kg/m  General appearance: alert, cooperative and no distress Head: Normocephalic, without obvious abnormality, atraumatic Eyes: EOMI Lungs: Coarse breath sounds bilaterally, no wheezing Heart: regular rate and rhythm and S1, S2 normal Abdomen: normal findings: bowel sounds normal and soft, non-tender Extremities: 1+ lower extremity edema Skin: mobility and turgor normal Neurologic: Grossly normal  The results of significant  diagnostics from this hospitalization (including imaging, microbiology, ancillary and laboratory) are listed below for reference.   Microbiology: Recent Results (from the past 240 hour(s))  Novel Coronavirus, NAA ([facility])     Status: None   Collection Time: 02/13/20 12:00 AM   Specimen: Nasopharyngeal(NP) swabs in vial transport medium   Nasopharynge  Result Value Ref Range Status   SARS-CoV-2, NAA Not Detected Not Detected Final    Comment: This nucleic acid amplification test was developed and its performance characteristics determined by [employer] and [employer]. Nucleic acid amplification tests include RT-PCR and TMA. This test has not been FDA cleared or approved. This test has been authorized by FDA under an Emergency Use Authorization (EUA). This test is only authorized for the duration of time the declaration that circumstances exist justifying the authorization of the emergency use of in vitro diagnostic tests for detection of SARS-CoV-2 virus and/or diagnosis of COVID-19 infection under section 564(b)(1) of the Act, 21 U.S.C. 458KDX-8(P) (1), unless the authorization is terminated or revoked sooner. When diagnostic testing is negative, the possibility of a false negative result should be considered in the context of a patient's recent exposures and the presence of clinical signs and symptoms consistent with COVID-19. An individual without symptoms of COVID-19 and who is not shedding SARS-CoV-2 virus wo uld expect to have a negative (not  detected) result in this assay.   SARS-COV-2, NAA 2 DAY TAT     Status: None   Collection Time: 02/13/20 12:00 AM   Nasopharynge  Result Value Ref Range Status   SARS-CoV-2, NAA 2 DAY TAT Performed  Final  Respiratory Panel by RT PCR (Flu A&B, Covid) - Nasopharyngeal Swab     Status: None   Collection Time: 02/19/20  8:08 PM   Specimen: Nasopharyngeal Swab; Nasopharyngeal(NP) swabs in vial transport medium  Result Value Ref Range Status   SARS Coronavirus 2 by RT PCR NEGATIVE NEGATIVE Final    Comment: (NOTE) SARS-CoV-2 target nucleic acids are NOT DETECTED.  The SARS-CoV-2 RNA is generally detectable in upper respiratoy specimens during the acute phase of infection. The lowest concentration of SARS-CoV-2 viral copies this assay can detect is 131 copies/mL. A negative result does not preclude SARS-Cov-2 infection and should not be used as the sole basis for treatment or other patient management decisions. A negative result may occur with  improper specimen collection/handling, submission of specimen other than nasopharyngeal swab, presence of viral mutation(s) within the areas targeted by this assay, and inadequate number of viral copies (<131 copies/mL). A negative result must be combined with clinical observations, patient history, and epidemiological information. The expected result is Negative.  Fact Sheet for Patients:  PinkCheek.be  Fact Sheet for Healthcare Providers:  GravelBags.it  This test is no t yet approved or cleared by the [place] FDA and  has been authorized for detection and/or diagnosis of SARS-CoV-2 by FDA under an Emergency Use Authorization (EUA). This EUA will remain  in effect (meaning this test can be used) for the duration of the COVID-19 declaration under Section 564(b)(1) of the Act, 21 U.S.C. section 360bbb-3(b)(1), unless the authorization is terminated or revoked sooner.      Influenza A by PCR NEGATIVE NEGATIVE Final   Influenza B by PCR NEGATIVE NEGATIVE Final    Comment: (NOTE) The Xpert Xpress SARS-CoV-2/FLU/RSV assay is intended as an aid in  the diagnosis of influenza from Nasopharyngeal swab specimens and  should not be used as a sole basis for treatment. Nasal washings and  aspirates  are unacceptable for Xpert Xpress SARS-CoV-2/FLU/RSV  testing.  Fact Sheet for Patients: PinkCheek.be  Fact Sheet for Healthcare Providers: GravelBags.it  This test is not yet approved or cleared by the [place] FDA and  has been authorized for detection and/or diagnosis of SARS-CoV-2 by  FDA under an Emergency Use Authorization (EUA). This EUA will remain  in effect (meaning this test can be used) for the duration of the  Covid-19 declaration under Section 564(b)(1) of the Act, 21  U.S.C. section 360bbb-3(b)(1), unless the authorization is  terminated or revoked. Performed at [facility], [place] [address] [place], [place] [address]      Labs: BNP (last 3 results) Recent Labs    02/19/20 1652  BNP 258.5*   Basic Metabolic Panel: Recent Labs  Lab 02/19/20 1651 02/19/20 1948 02/20/20 0241 02/21/20 0252  NA 137 140 138 137  K 4.2 4.1 4.0 4.1  CL 103 103 99 98  CO2 25 24 24 26   GLUCOSE 110* 143* 107* 125*  BUN 27* 24* 25* 30*  CREATININE 1.18 1.15 1.10 1.28*  CALCIUM 8.4* 8.6* 8.1* 8.6*  MG  --   --   --  2.3   Liver Function Tests: No results for input(s): AST, ALT, ALKPHOS, BILITOT, PROT, ALBUMIN in the last 168 hours. No results for input(s): LIPASE, AMYLASE in the last 168 hours. No results for input(s): AMMONIA in the last 168 hours. CBC: Recent Labs  Lab 02/19/20 1948 02/20/20 0241 02/21/20 0252  WBC 9.1 8.9 13.7*  NEUTROABS  --   --  11.2*  HGB 13.9 12.6* 12.8*  HCT 44.4 38.6* 39.1  MCV 91.0 88.5 87.3  PLT 118* 115* 119*   Cardiac Enzymes: No results for  input(s): CKTOTAL, CKMB, CKMBINDEX, TROPONINI in the last 168 hours. BNP: Invalid input(s): POCBNP CBG: No results for input(s): GLUCAP in the last 168 hours. D-Dimer Recent Labs    02/19/20 1651  DDIMER 1.50*   Hgb A1c No results for input(s): HGBA1C in the last 72 hours. Lipid Profile No results for input(s): CHOL, HDL, LDLCALC, TRIG, CHOLHDL, LDLDIRECT in the last 72 hours. Thyroid function studies No results for input(s): TSH, T4TOTAL, T3FREE, THYROIDAB in the last 72 hours.  Invalid input(s): FREET3 Anemia work up No results for input(s): VITAMINB12, FOLATE, FERRITIN, TIBC, IRON, RETICCTPCT in the last 72 hours. Urinalysis    Component Value Date/Time   COLORURINE YELLOW 11/20/2015 0836   APPEARANCEUR CLEAR 11/20/2015 0836   LABSPEC 1.025 11/20/2015 0836   PHURINE 5.0 11/20/2015 0836   GLUCOSEU NEGATIVE 11/20/2015 0836   HGBUR NEGATIVE 11/20/2015 0836   BILIRUBINUR NEGATIVE 11/20/2015 0836   KETONESUR NEGATIVE 11/20/2015 0836   PROTEINUR 100 (A) 11/20/2015 0836   NITRITE Negative 07/13/2019 1321   NITRITE NEGATIVE 11/20/2015 0836   LEUKOCYTESUR Negative 07/13/2019 1321   Sepsis Labs Invalid input(s): PROCALCITONIN,  WBC,  LACTICIDVEN Microbiology Recent Results (from the past 240 hour(s))  Novel Coronavirus, NAA ([facility])     Status: None   Collection Time: 02/13/20 12:00 AM   Specimen: Nasopharyngeal(NP) swabs in vial transport medium   Nasopharynge  Result Value Ref Range Status   SARS-CoV-2, NAA Not Detected Not Detected Final    Comment: This nucleic acid amplification test was developed and its performance characteristics determined by [employer], [employer] and Company. Nucleic acid amplification tests include RT-PCR and TMA. This test has not been FDA cleared or approved. This test has been authorized by FDA under an Emergency Use Authorization (EUA). This test is  only authorized for the duration of time the declaration that circumstances exist justifying the  authorization of the emergency use of in vitro diagnostic tests for detection of SARS-CoV-2 virus and/or diagnosis of COVID-19 infection under section 564(b)(1) of the Act, 21 U.S.C. 712WPY-0(D) (1), unless the authorization is terminated or revoked sooner. When diagnostic testing is negative, the possibility of a false negative result should be considered in the context of a patient's recent exposures and the presence of clinical signs and symptoms consistent with COVID-19. An individual without symptoms of COVID-19 and who is not shedding SARS-CoV-2 virus wo uld expect to have a negative (not detected) result in this assay.   SARS-COV-2, NAA 2 DAY TAT     Status: None   Collection Time: 02/13/20 12:00 AM   Nasopharynge  Result Value Ref Range Status   SARS-CoV-2, NAA 2 DAY TAT Performed  Final  Respiratory Panel by RT PCR (Flu A&B, Covid) - Nasopharyngeal Swab     Status: None   Collection Time: 02/19/20  8:08 PM   Specimen: Nasopharyngeal Swab; Nasopharyngeal(NP) swabs in vial transport medium  Result Value Ref Range Status   SARS Coronavirus 2 by RT PCR NEGATIVE NEGATIVE Final    Comment: (NOTE) SARS-CoV-2 target nucleic acids are NOT DETECTED.  The SARS-CoV-2 RNA is generally detectable in upper respiratoy specimens during the acute phase of infection. The lowest concentration of SARS-CoV-2 viral copies this assay can detect is 131 copies/mL. A negative result does not preclude SARS-Cov-2 infection and should not be used as the sole basis for treatment or other patient management decisions. A negative result may occur with  improper specimen collection/handling, submission of specimen other than nasopharyngeal swab, presence of viral mutation(s) within the areas targeted by this assay, and inadequate number of viral copies (<131 copies/mL). A negative result must be combined with clinical observations, patient history, and epidemiological information. The expected result is  Negative.  Fact Sheet for Patients:  PinkCheek.be  Fact Sheet for Healthcare Providers:  GravelBags.it  This test is no t yet approved or cleared by the [place] FDA and  has been authorized for detection and/or diagnosis of SARS-CoV-2 by FDA under an Emergency Use Authorization (EUA). This EUA will remain  in effect (meaning this test can be used) for the duration of the COVID-19 declaration under Section 564(b)(1) of the Act, 21 U.S.C. section 360bbb-3(b)(1), unless the authorization is terminated or revoked sooner.     Influenza A by PCR NEGATIVE NEGATIVE Final   Influenza B by PCR NEGATIVE NEGATIVE Final    Comment: (NOTE) The Xpert Xpress SARS-CoV-2/FLU/RSV assay is intended as an aid in  the diagnosis of influenza from Nasopharyngeal swab specimens and  should not be used as a sole basis for treatment. Nasal washings and  aspirates are unacceptable for Xpert Xpress SARS-CoV-2/FLU/RSV  testing.  Fact Sheet for Patients: PinkCheek.be  Fact Sheet for Healthcare Providers: GravelBags.it  This test is not yet approved or cleared by the [place] FDA and  has been authorized for detection and/or diagnosis of SARS-CoV-2 by  FDA under an Emergency Use Authorization (EUA). This EUA will remain  in effect (meaning this test can be used) for the duration of the  Covid-19 declaration under Section 564(b)(1) of the Act, 21  U.S.C. section 360bbb-3(b)(1), unless the authorization is  terminated or revoked. Performed at [facility], [place] [address] [place], [place] [address]     Procedures/Studies: DG Chest 2 View  Result Date: 02/19/2020 CLINICAL  DATA:  85 year old male with chest pain and shortness of breath EXAM: CHEST - 2 VIEW COMPARISON:  Chest radiograph dated 03/26/2014 FINDINGS: There is mild vascular congestion and mild diffuse interstitial  prominence, likely edema. There are small bilateral pleural effusions with minimal bibasilar atelectasis. Pneumonia is not excluded. No pneumothorax. The cardiac silhouette is within limits. Atherosclerotic calcification of the aorta. Coronary vascular stent. No acute osseous pathology. Degenerative changes of the spine and shoulders. IMPRESSION: 1. Mild vascular congestion and interstitial edema. 2. Small bilateral pleural effusions with minimal bibasilar atelectasis. Electronically Signed   By: Anner Crete M.D.   On: 02/19/2020 17:06   CT Angio Chest PE W/Cm &/Or Wo Cm  Result Date: 02/19/2020 CLINICAL DATA:  Elevated D-dimer. EXAM: CT ANGIOGRAPHY CHEST WITH CONTRAST TECHNIQUE: Multidetector CT imaging of the chest was performed using the standard protocol during bolus administration of intravenous contrast. Multiplanar CT image reconstructions and MIPs were obtained to evaluate the vascular anatomy. CONTRAST:  65mL OMNIPAQUE IOHEXOL 350 MG/ML SOLN COMPARISON:  None. FINDINGS: Cardiovascular: There is marked severity calcification of the thoracic aorta. Satisfactory opacification of the pulmonary arteries to the segmental level. No evidence of pulmonary embolism. Normal heart size with marked severity coronary artery calcification. No pericardial effusion. Mediastinum/Nodes: No enlarged mediastinal, hilar, or axillary lymph nodes. Thyroid gland, trachea, and esophagus demonstrate no significant findings. Lungs/Pleura: Mild areas of atelectasis and/or infiltrate are seen within the bilateral lower lobes. Small to moderate size bilateral pleural effusions are seen, right greater than left. No pneumothorax is identified. Upper Abdomen: There is a large hiatal hernia. Musculoskeletal: Multilevel degenerative changes seen throughout the thoracic spine Review of the MIP images confirms the above findings. IMPRESSION: 1. No evidence of pulmonary embolus. 2. Mild bilateral lower lobe atelectasis and/or  infiltrate. 3. Small to moderate size bilateral pleural effusions, right greater than left. 4. Large hiatal hernia. Aortic Atherosclerosis (ICD10-I70.0). Electronically Signed   By: Virgina Norfolk M.D.   On: 02/19/2020 21:16   ECHOCARDIOGRAM COMPLETE  Result Date: 02/20/2020    ECHOCARDIOGRAM REPORT   Patient Name:   [name] [name] Date of Exam: 02/20/2020 Medical Rec #:  [id number]         Height:       68.0 in Accession #:    [id number]        Weight:       174.8 lb Date of Birth:  February 22, 1935        BSA:          1.930 m Patient Age:    85 years          BP:           150/98 mmHg Patient Gender: M                 HR:           64 bpm. Exam Location:  Inpatient Procedure: 2D Echo, Cardiac Doppler and Color Doppler Indications:    Dyspnea 786.09 / R06.00  History:        Patient has prior history of Echocardiogram examinations, most                 recent 04/03/2014. Previous Myocardial Infarction and CAD, Stroke,                 Signs/Symptoms:Dyspnea; Risk Factors:Hypertension and                 Dyslipidemia.  Sonographer:    Bernadene Person RDCS Referring  Phys: [id number] [place]  1. Left ventricular ejection fraction, by estimation, is 50 to 55%. The left ventricle has low normal function. The left ventricle demonstrates regional wall motion abnormalities (see scoring diagram/findings for description). Left ventricular diastolic  parameters are consistent with Grade II diastolic dysfunction (pseudonormalization). Elevated left atrial pressure. There is mild hypokinesis of the left ventricular, basal inferoseptal wall and inferior wall.  2. Right ventricular systolic function is normal. The right ventricular size is normal. There is moderately elevated pulmonary artery systolic pressure. The estimated right ventricular systolic pressure is 40.9 mmHg.  3. Left atrial size was severely dilated.  4. The pericardial effusion is anterior to the right ventricle and circumferential. There is  no evidence of cardiac tamponade.  5. The mitral valve is normal in structure. Trivial mitral valve regurgitation.  6. The aortic valve is tricuspid. Aortic valve regurgitation is trivial. Mild aortic valve sclerosis is present, with no evidence of aortic valve stenosis.  7. The inferior vena cava is normal in size with greater than 50% respiratory variability, suggesting right atrial pressure of 3 mmHg. Comparison(s): Prior images unable to be directly viewed, comparison made by report only. FINDINGS  Left Ventricle: Left ventricular ejection fraction, by estimation, is 50 to 55%. The left ventricle has low normal function. The left ventricle demonstrates regional wall motion abnormalities. Mild hypokinesis of the left ventricular, basal inferoseptal  wall and inferior wall. The left ventricular internal cavity size was normal in size. There is no left ventricular hypertrophy. Left ventricular diastolic parameters are consistent with Grade II diastolic dysfunction (pseudonormalization). Elevated left  atrial pressure. Right Ventricle: The right ventricular size is normal. No increase in right ventricular wall thickness. Right ventricular systolic function is normal. There is moderately elevated pulmonary artery systolic pressure. The tricuspid regurgitant velocity is 3.41 m/s, and with an assumed right atrial pressure of 3 mmHg, the estimated right ventricular systolic pressure is 73.5 mmHg. Left Atrium: Left atrial size was severely dilated. Right Atrium: Right atrial size was normal in size. Pericardium: Trivial pericardial effusion is present. The pericardial effusion is anterior to the right ventricle and circumferential. There is no evidence of cardiac tamponade. Mitral Valve: The mitral valve is normal in structure. Mild mitral annular calcification. Trivial mitral valve regurgitation. Tricuspid Valve: The tricuspid valve is normal in structure. Tricuspid valve regurgitation is trivial. Aortic Valve: The  aortic valve is tricuspid. Aortic valve regurgitation is trivial. Aortic regurgitation PHT measures 825 msec. Mild aortic valve sclerosis is present, with no evidence of aortic valve stenosis. Pulmonic Valve: The pulmonic valve was grossly normal. Pulmonic valve regurgitation is not visualized. Aorta: The aortic root and ascending aorta are structurally normal, with no evidence of dilitation. Venous: The inferior vena cava is normal in size with greater than 50% respiratory variability, suggesting right atrial pressure of 3 mmHg. IAS/Shunts: No atrial level shunt detected by color flow Doppler.  LEFT VENTRICLE PLAX 2D LVIDd:         4.20 cm  Diastology LVIDs:         3.10 cm  LV e' medial:    3.81 cm/s LV PW:         1.10 cm  LV E/e' medial:  28.9 LV IVS:        1.10 cm  LV e' lateral:   6.73 cm/s LVOT diam:     2.00 cm  LV E/e' lateral: 16.3 LV SV:         59 LV SV Index:  31 LVOT Area:     3.14 cm  RIGHT VENTRICLE RV S prime:     11.40 cm/s TAPSE (M-mode): 2.0 cm LEFT ATRIUM              Index       RIGHT ATRIUM           Index LA diam:        4.90 cm  2.54 cm/m  RA Area:     16.70 cm LA Vol (A2C):   82.1 ml  42.54 ml/m RA Volume:   38.30 ml  19.84 ml/m LA Vol (A4C):   131.0 ml 67.87 ml/m LA Biplane Vol: 106.0 ml 54.92 ml/m  AORTIC VALVE LVOT Vmax:   75.20 cm/s LVOT Vmean:  59.400 cm/s LVOT VTI:    0.188 m AI PHT:      825 msec  AORTA Ao Root diam: 3.60 cm Ao Asc diam:  3.50 cm MITRAL VALVE                TRICUSPID VALVE MV Area (PHT): 3.42 cm     TR Peak grad:   46.5 mmHg MV Decel Time: 222 msec     TR Vmax:        341.00 cm/s MV E velocity: 110.00 cm/s MV A velocity: 113.00 cm/s  SHUNTS MV E/A ratio:  0.97         Systemic VTI:  0.19 m                             Systemic Diam: 2.00 cm Dani Gobble Croitoru MD Electronically signed by Sanda Klein MD Signature Date/Time: 02/20/2020/10:01:17 AM    Final      Time coordinating discharge: Over 30 minutes    Dwyane Dee, MD  [facility] 02/21/2020, 4:08 PM

## 2020-02-21 NOTE — Plan of Care (Signed)
  Problem: Education: Goal: Knowledge of General Education information will improve Description: Including pain rating scale, medication(s)/side effects and non-pharmacologic comfort measures Outcome: Adequate for Discharge   Problem: Health Behavior/Discharge Planning: Goal: Ability to manage health-related needs will improve Outcome: Adequate for Discharge   Problem: Clinical Measurements: Goal: Ability to maintain clinical measurements within normal limits will improve Outcome: Adequate for Discharge Goal: Will remain free from infection Outcome: Adequate for Discharge Goal: Diagnostic test results will improve Outcome: Adequate for Discharge Goal: Respiratory complications will improve Outcome: Adequate for Discharge Goal: Cardiovascular complication will be avoided Outcome: Adequate for Discharge   Problem: Activity: Goal: Risk for activity intolerance will decrease Outcome: Adequate for Discharge   Problem: Nutrition: Goal: Adequate nutrition will be maintained Outcome: Adequate for Discharge   Problem: Coping: Goal: Level of anxiety will decrease Outcome: Adequate for Discharge   Problem: Elimination: Goal: Will not experience complications related to bowel motility Outcome: Adequate for Discharge Goal: Will not experience complications related to urinary retention Outcome: Adequate for Discharge   Problem: Pain Managment: Goal: General experience of comfort will improve Outcome: Adequate for Discharge   Problem: Safety: Goal: Ability to remain free from injury will improve Outcome: Adequate for Discharge   Problem: Skin Integrity: Goal: Risk for impaired skin integrity will decrease Outcome: Adequate for Discharge   Problem: Education: Goal: Understanding of cardiac disease, CV risk reduction, and recovery process will improve Outcome: Adequate for Discharge Goal: Individualized Educational Video(s) Outcome: Adequate for Discharge   Problem:  Activity: Goal: Ability to tolerate increased activity will improve Outcome: Adequate for Discharge   Problem: Cardiac: Goal: Ability to achieve and maintain adequate cardiovascular perfusion will improve Outcome: Adequate for Discharge   Problem: Health Behavior/Discharge Planning: Goal: Ability to safely manage health-related needs after discharge will improve Outcome: Adequate for Discharge   Problem: Education: Goal: Ability to demonstrate management of disease process will improve Outcome: Adequate for Discharge Goal: Ability to verbalize understanding of medication therapies will improve Outcome: Adequate for Discharge   Problem: Activity: Goal: Capacity to carry out activities will improve Outcome: Adequate for Discharge   Problem: Cardiac: Goal: Ability to achieve and maintain adequate cardiopulmonary perfusion will improve Outcome: Adequate for Discharge

## 2020-02-23 ENCOUNTER — Telehealth: Payer: Self-pay | Admitting: Family Medicine

## 2020-02-23 NOTE — Telephone Encounter (Signed)
Nurses Patient just recently discharged from the hospital due to CHF He will need to do a metabolic 7 this week  Please make sure that he is following the directions of the discharge summary.  Also patient should bring all of his medication-pill bottles with him to the upcoming office visit  Then he will need to do a follow-up visit the week of December 6 with me  If he is having any significant issues please let us know

## 2020-02-26 ENCOUNTER — Other Ambulatory Visit: Payer: Self-pay | Admitting: Family Medicine

## 2020-02-26 DIAGNOSIS — I5033 Acute on chronic diastolic (congestive) heart failure: Secondary | ICD-10-CM

## 2020-02-26 NOTE — Telephone Encounter (Signed)
Pt wife contacted and verbalized understanding. Lab order placed. Appt schedule for 03/05/20 at 11:00 am. Informed wife to have patient bring pill bottles. Pt wife verbalized understanding.

## 2020-02-28 ENCOUNTER — Other Ambulatory Visit: Payer: Self-pay | Admitting: *Deleted

## 2020-02-28 ENCOUNTER — Encounter: Payer: Self-pay | Admitting: Family Medicine

## 2020-02-28 MED ORDER — PANTOPRAZOLE SODIUM 20 MG PO TBEC
20.0000 mg | DELAYED_RELEASE_TABLET | Freq: Two times a day (BID) | ORAL | 1 refills | Status: DC
Start: 2020-02-28 — End: 2020-07-10

## 2020-03-05 ENCOUNTER — Ambulatory Visit (INDEPENDENT_AMBULATORY_CARE_PROVIDER_SITE_OTHER): Payer: PPO | Admitting: Family Medicine

## 2020-03-05 ENCOUNTER — Other Ambulatory Visit: Payer: Self-pay

## 2020-03-05 ENCOUNTER — Encounter: Payer: Self-pay | Admitting: Family Medicine

## 2020-03-05 VITALS — BP 138/88 | HR 60 | Temp 97.6°F | Wt 178.6 lb

## 2020-03-05 DIAGNOSIS — Z79899 Other long term (current) drug therapy: Secondary | ICD-10-CM | POA: Diagnosis not present

## 2020-03-05 DIAGNOSIS — D72829 Elevated white blood cell count, unspecified: Secondary | ICD-10-CM | POA: Diagnosis not present

## 2020-03-05 DIAGNOSIS — I5033 Acute on chronic diastolic (congestive) heart failure: Secondary | ICD-10-CM

## 2020-03-05 NOTE — Progress Notes (Signed)
   Subjective:    Patient ID: [name] [name], male    DOB: December 19, 1934, 85 y.o.   MRN: [id number]  HPI Pt here for hospital follow up. Pt was in [facility] for CHF from 02/19/20-02/21/20. Doing well since being at home Patient with a history of CHF was recently in the hospital because of possible pneumonia as well as CHF Had his medications adjusted was sent over here today for follow-up has follow-up with cardiology later this month had a significant elevation of blood pressure as an outpatient  Review of Systems  Constitutional: Negative for activity change, fatigue and fever.  HENT: Negative for congestion and rhinorrhea.   Respiratory: Negative for cough and shortness of breath.   Cardiovascular: Negative for chest pain and leg swelling.  Gastrointestinal: Negative for abdominal pain, diarrhea and nausea.  Genitourinary: Negative for dysuria and hematuria.  Neurological: Negative for weakness and headaches.  Psychiatric/Behavioral: Negative for agitation and behavioral problems.       Objective:   Physical Exam Vitals reviewed.  Constitutional:      General: He is not in acute distress. HENT:     Head: Normocephalic and atraumatic.  Eyes:     General:        Right eye: No discharge.        Left eye: No discharge.  Neck:     Trachea: No tracheal deviation.  Cardiovascular:     Rate and Rhythm: Normal rate.     Heart sounds: Normal heart sounds. No murmur heard.   Pulmonary:     Effort: Pulmonary effort is normal. No respiratory distress.     Breath sounds: Normal breath sounds.  Lymphadenopathy:     Cervical: No cervical adenopathy.  Skin:    General: Skin is warm and dry.  Neurological:     Mental Status: He is alert.     Coordination: Coordination normal.  Psychiatric:        Behavior: Behavior normal.     His medications were reviewed one by one we discussed these in detail also when to take these.      Assessment & Plan:  1. Acute on chronic diastolic CHF  (congestive heart failure) ([place]) Patient doing much better now minimal edema in the lower legs lungs sound clear heart rate control blood pressure good continue current measures check electrolytes - CBC with Differential - Basic Metabolic Panel (BMET) - Magnesium  2. Leukocytosis, unspecified type Had leukocytosis when in the hospital we will do a follow-up CBC no fevers currently - CBC with Differential - Basic Metabolic Panel (BMET) - Magnesium  3. Long term current use of diuretic Because of long-term use of diuretic we will go ahead with checking electrolytes as well.  Patient to follow-up in 3 months - CBC with Differential - Basic Metabolic Panel (BMET) - Magnesium

## 2020-03-06 LAB — BASIC METABOLIC PANEL
BUN/Creatinine Ratio: 18 (ref 10–24)
BUN: 29 mg/dL — ABNORMAL HIGH (ref 8–27)
CO2: 23 mmol/L (ref 20–29)
Calcium: 8.8 mg/dL (ref 8.6–10.2)
Chloride: 106 mmol/L (ref 96–106)
Creatinine, Ser: 1.63 mg/dL — ABNORMAL HIGH (ref 0.76–1.27)
GFR calc Af Amer: 44 mL/min/{1.73_m2} — ABNORMAL LOW (ref >59)
GFR calc non Af Amer: 38 mL/min/{1.73_m2} — ABNORMAL LOW (ref >59)
Glucose: 76 mg/dL (ref 65–99)
Potassium: 5.1 mmol/L (ref 3.5–5.2)
Sodium: 142 mmol/L (ref 134–144)

## 2020-03-06 LAB — CBC WITH DIFFERENTIAL/PLATELET
Basophils Absolute: 0 10*3/uL (ref 0.0–0.2)
Basos: 1 %
EOS (ABSOLUTE): 0.3 10*3/uL (ref 0.0–0.4)
Eos: 4 %
Hematocrit: 43.3 % (ref 37.5–51.0)
Hemoglobin: 14 g/dL (ref 13.0–17.7)
Immature Grans (Abs): 0 10*3/uL (ref 0.0–0.1)
Immature Granulocytes: 0 %
Lymphocytes Absolute: 1 10*3/uL (ref 0.7–3.1)
Lymphs: 13 %
MCH: 28.3 pg (ref 26.6–33.0)
MCHC: 32.3 g/dL (ref 31.5–35.7)
MCV: 88 fL (ref 79–97)
Monocytes Absolute: 0.6 10*3/uL (ref 0.1–0.9)
Monocytes: 8 %
Neutrophils Absolute: 5.5 10*3/uL (ref 1.4–7.0)
Neutrophils: 74 %
Platelets: 140 10*3/uL — ABNORMAL LOW (ref 150–450)
RBC: 4.94 x10E6/uL (ref 4.14–5.80)
RDW: 13.6 % (ref 11.6–15.4)
WBC: 7.4 10*3/uL (ref 3.4–10.8)

## 2020-03-06 LAB — MAGNESIUM: Magnesium: 2 mg/dL (ref 1.6–2.3)

## 2020-03-07 ENCOUNTER — Other Ambulatory Visit: Payer: Self-pay | Admitting: *Deleted

## 2020-03-07 DIAGNOSIS — I10 Essential (primary) hypertension: Secondary | ICD-10-CM

## 2020-03-07 MED ORDER — LISINOPRIL 20 MG PO TABS: 20.0000 mg | ORAL_TABLET | Freq: Every day | ORAL | 0 refills | Status: DC

## 2020-03-17 ENCOUNTER — Other Ambulatory Visit: Payer: Self-pay | Admitting: Family Medicine

## 2020-03-19 ENCOUNTER — Other Ambulatory Visit: Payer: Self-pay

## 2020-03-19 ENCOUNTER — Other Ambulatory Visit: Payer: Self-pay | Admitting: Family Medicine

## 2020-03-19 MED ORDER — CLOPIDOGREL BISULFATE 75 MG PO TABS
75.0000 mg | ORAL_TABLET | Freq: Every day | ORAL | 0 refills | Status: DC
Start: 2020-03-19 — End: 2020-07-23

## 2020-03-19 MED ORDER — AMLODIPINE BESYLATE 10 MG PO TABS
10.0000 mg | ORAL_TABLET | Freq: Every day | ORAL | 0 refills | Status: DC
Start: 2020-03-19 — End: 2020-07-23

## 2020-03-26 ENCOUNTER — Encounter: Payer: Self-pay | Admitting: Physician Assistant

## 2020-03-26 ENCOUNTER — Other Ambulatory Visit: Payer: Self-pay

## 2020-03-26 ENCOUNTER — Ambulatory Visit (INDEPENDENT_AMBULATORY_CARE_PROVIDER_SITE_OTHER): Payer: PPO | Admitting: Physician Assistant

## 2020-03-26 VITALS — BP 200/90 | HR 60 | Ht 68.0 in | Wt 182.0 lb

## 2020-03-26 DIAGNOSIS — I739 Peripheral vascular disease, unspecified: Secondary | ICD-10-CM

## 2020-03-26 DIAGNOSIS — I10 Essential (primary) hypertension: Secondary | ICD-10-CM | POA: Diagnosis not present

## 2020-03-26 DIAGNOSIS — I2510 Atherosclerotic heart disease of native coronary artery without angina pectoris: Secondary | ICD-10-CM

## 2020-03-26 DIAGNOSIS — I714 Abdominal aortic aneurysm, without rupture, unspecified: Secondary | ICD-10-CM

## 2020-03-26 DIAGNOSIS — Z8673 Personal history of transient ischemic attack (TIA), and cerebral infarction without residual deficits: Secondary | ICD-10-CM | POA: Diagnosis not present

## 2020-03-26 DIAGNOSIS — E785 Hyperlipidemia, unspecified: Secondary | ICD-10-CM | POA: Diagnosis not present

## 2020-03-26 DIAGNOSIS — N179 Acute kidney failure, unspecified: Secondary | ICD-10-CM

## 2020-03-26 DIAGNOSIS — I6522 Occlusion and stenosis of left carotid artery: Secondary | ICD-10-CM | POA: Diagnosis not present
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
## 2020-03-26 NOTE — Patient Instructions (Addendum)
Medication Instructions:  Your physician recommends that you continue on your current medications as directed. Please refer to the Current Medication list given to you today.  *If you need a refill on your cardiac medications before your next appointment, please call your pharmacy*  Lab Work: Your physician recommends that you return for lab work TODAY:  BMET  If you have labs (blood work) drawn today and your tests are completely normal, you will receive your results only by: [name] MyChart Message (if you have MyChart) OR . A paper copy in the mail If you have any lab test that is abnormal or we need to change your treatment, we will call you to review the results.  Testing/Procedures: NONE ordered at this time of appointment   Follow-Up: At [facility], you and your health needs are our priority.  As part of our continuing mission to provide you with exceptional heart care, we have created designated Provider Care Teams.  These Care Teams include your primary Cardiologist (physician) and Advanced Practice Providers (APPs -  Physician Assistants and Nurse Practitioners) who all work together to provide you with the care you need, when you need it.  We recommend signing up for the patient portal called "MyChart".  Sign up information is provided on this After Visit Summary.  MyChart is used to connect with patients for Virtual Visits (Telemedicine).  Patients are able to view lab/test results, encounter notes, upcoming appointments, etc.  Non-urgent messages can be sent to your provider as well.   To learn more about what you can do with MyChart, go to [email or web address].    Your next appointment:   3 week(s) 3 month(s)  The format for your next appointment:   In Person In Person   Provider:   Azalee Course, PA-C Nanetta Batty, MD  Other Instructions

## 2020-03-27 ENCOUNTER — Encounter: Payer: Self-pay | Admitting: Physician Assistant

## 2020-03-27 LAB — BASIC METABOLIC PANEL
BUN/Creatinine Ratio: 15 (ref 10–24)
BUN: 19 mg/dL (ref 8–27)
CO2: 22 mmol/L (ref 20–29)
Calcium: 9 mg/dL (ref 8.6–10.2)
Chloride: 105 mmol/L (ref 96–106)
Creatinine, Ser: 1.28 mg/dL — ABNORMAL HIGH (ref 0.76–1.27)
GFR calc Af Amer: 59 mL/min/{1.73_m2} — ABNORMAL LOW (ref >59)
GFR calc non Af Amer: 51 mL/min/{1.73_m2} — ABNORMAL LOW (ref >59)
Glucose: 94 mg/dL (ref 65–99)
Potassium: 5 mmol/L (ref 3.5–5.2)
Sodium: 143 mmol/L (ref 134–144)

## 2020-03-28 ENCOUNTER — Other Ambulatory Visit: Payer: Self-pay

## 2020-03-28 MED ORDER — METOPROLOL TARTRATE 50 MG PO TABS
50.0000 mg | ORAL_TABLET | Freq: Two times a day (BID) | ORAL | 3 refills | Status: DC
Start: 2020-03-28 — End: 2020-08-18

## 2020-04-02 ENCOUNTER — Telehealth: Payer: Self-pay | Admitting: Physician Assistant

## 2020-04-02 ENCOUNTER — Other Ambulatory Visit: Payer: Self-pay | Admitting: Cardiovascular Disease

## 2020-04-02 NOTE — Telephone Encounter (Signed)
Spoke to [facility] order pharmacy wanting to verify Lisinopril dose.Advised patient is taking Lisinopril 20 mg daily.

## 2020-04-02 NOTE — Telephone Encounter (Signed)
     Pt c/o medication issue:  1. Name of Medication:   lisinopril (ZESTRIL) 20 MG tablet    2. How are you currently taking this medication (dosage and times per day)? Take 1 tablet (20 mg total) by mouth daily.  3. Are you having a reaction (difficulty breathing--STAT)?   4. What is your medication issue? Muriel with [employer] called, she said she would like to clarify medication dosage for lisinopril, they're seeing 40mg  and 20 mg. They wanted to know which ones they need to refill. She gave ref# [id number]

## 2020-04-08 ENCOUNTER — Ambulatory Visit (INDEPENDENT_AMBULATORY_CARE_PROVIDER_SITE_OTHER): Payer: PPO | Admitting: Family Medicine

## 2020-04-08 ENCOUNTER — Encounter: Payer: Self-pay | Admitting: Family Medicine

## 2020-04-08 VITALS — BP 148/76 | HR 71 | Temp 97.9°F | Ht 68.0 in | Wt 180.0 lb

## 2020-04-08 DIAGNOSIS — I10 Essential (primary) hypertension: Secondary | ICD-10-CM | POA: Diagnosis not present

## 2020-04-08 MED ORDER — TRIAMCINOLONE ACETONIDE 0.1 % EX CREA: TOPICAL_CREAM | CUTANEOUS | 4 refills | Status: DC

## 2020-04-08 NOTE — Progress Notes (Signed)
   Subjective:    Patient ID: [name] [name], male    DOB: 04/22/1934, 86 y.o.   MRN: [id number]  HPIbp check up. Concerns about elevated creatinine on dr berry's bloodwork.  Patient taking his medication as directed. He states he does not want to take a large amount of blood pressure medicines because he states those often make him feel fatigued and feel bad.  He denies any chest tightness pressure pain or headache currently. Results for orders placed or performed in visit on 10/93/23  Basic metabolic panel  Result Value Ref Range   Glucose 94 65 - 99 mg/dL   BUN 19 8 - 27 mg/dL   Creatinine, Ser 1.28 (H) 0.76 - 1.27 mg/dL   GFR calc non Af Amer 51 (L) >59 mL/min/1.73   GFR calc Af Amer 59 (L) >59 mL/min/1.73   BUN/Creatinine Ratio 15 10 - 24   Sodium 143 134 - 144 mmol/L   Potassium 5.0 3.5 - 5.2 mmol/L   Chloride 105 96 - 106 mmol/L   CO2 22 20 - 29 mmol/L   Calcium 9.0 8.6 - 10.2 mg/dL   Thanks Itching all over for a little over a week.     Review of Systems  Constitutional: Negative for activity change, fatigue and fever.  HENT: Negative for congestion and rhinorrhea.   Respiratory: Negative for cough and shortness of breath.   Cardiovascular: Negative for chest pain and leg swelling.  Gastrointestinal: Negative for abdominal pain, diarrhea and nausea.  Genitourinary: Negative for dysuria and hematuria.  Neurological: Negative for weakness and headaches.  Psychiatric/Behavioral: Negative for agitation and behavioral problems.       Objective:   Physical Exam Vitals reviewed.  Constitutional:      General: He is not in acute distress.    Appearance: He is well-nourished.  HENT:     Head: Normocephalic and atraumatic.  Eyes:     General:        Right eye: No discharge.        Left eye: No discharge.  Neck:     Trachea: No tracheal deviation.  Cardiovascular:     Rate and Rhythm: Normal rate and regular rhythm.     Heart sounds: Normal heart sounds. No  murmur heard.   Pulmonary:     Effort: Pulmonary effort is normal. No respiratory distress.     Breath sounds: Normal breath sounds.  Musculoskeletal:        General: No edema.  Lymphadenopathy:     Cervical: No cervical adenopathy.  Skin:    General: Skin is warm and dry.  Neurological:     Mental Status: He is alert.     Coordination: Coordination normal.  Psychiatric:        Mood and Affect: Mood and affect normal.        Behavior: Behavior normal.           Assessment & Plan:  Significant elevation of creatinine is doing somewhat better compared to where it was.  Recheck labs again before follow-up visit in March  Mild blood pressure elevation patient will be following up with cardiology I encouraged the patient that if blood pressure is still elevated that perhaps they can adjust the hydralazine  Family did not want to adjust blood pressure medicine today

## 2020-04-08 NOTE — Patient Instructions (Signed)
Please try to utilize hydralazine as 1 in the morning, 1 after lunch, 1 at 8 PM

## 2020-04-19 ENCOUNTER — Other Ambulatory Visit: Payer: Self-pay

## 2020-04-19 ENCOUNTER — Encounter: Payer: Self-pay | Admitting: Physician Assistant

## 2020-04-19 ENCOUNTER — Ambulatory Visit (INDEPENDENT_AMBULATORY_CARE_PROVIDER_SITE_OTHER): Payer: PPO | Admitting: Physician Assistant

## 2020-04-19 VITALS — BP 160/88 | HR 68 | Temp 97.0°F | Ht 68.0 in | Wt 180.8 lb

## 2020-04-19 DIAGNOSIS — I2510 Atherosclerotic heart disease of native coronary artery without angina pectoris: Secondary | ICD-10-CM

## 2020-04-19 DIAGNOSIS — E785 Hyperlipidemia, unspecified: Secondary | ICD-10-CM

## 2020-04-19 DIAGNOSIS — Z8673 Personal history of transient ischemic attack (TIA), and cerebral infarction without residual deficits: Secondary | ICD-10-CM

## 2020-04-19 DIAGNOSIS — I10 Essential (primary) hypertension: Secondary | ICD-10-CM | POA: Diagnosis not present

## 2020-04-19 DIAGNOSIS — I714 Abdominal aortic aneurysm, without rupture, unspecified: Secondary | ICD-10-CM

## 2020-04-19 DIAGNOSIS — I6522 Occlusion and stenosis of left carotid artery: Secondary | ICD-10-CM | POA: Diagnosis not present

## 2020-04-19 MED ORDER — HYDRALAZINE HCL 50 MG PO TABS
50.0000 mg | ORAL_TABLET | Freq: Three times a day (TID) | ORAL | 3 refills | Status: DC
Start: 2020-04-19 — End: 2020-07-02

## 2020-04-19 NOTE — Patient Instructions (Addendum)
Medication Instructions:   INCREASE Hydralazine to 50 mg 3 times a day  *If you need a refill on your cardiac medications before your next appointment, please call your pharmacy*  Lab Work: NONE ordered at this time of appointment   If you have labs (blood work) drawn today and your tests are completely normal, you will receive your results only by:  [place] (if you have MyChart) OR  A paper copy in the mail If you have any lab test that is abnormal or we need to change your treatment, we will call you to review the results.  Testing/Procedures: NONE ordered at this time of appointment   Follow-Up: At [facility], you and your health needs are our priority.  As part of our continuing mission to provide you with exceptional heart care, we have created designated Provider Care Teams.  These Care Teams include your primary Cardiologist (physician) and Advanced Practice Providers (APPs -  Physician Assistants and Nurse Practitioners) who all work together to provide you with the care you need, when you need it.  We recommend signing up for the patient portal called "MyChart".  Sign up information is provided on this After Visit Summary.  MyChart is used to connect with patients for Virtual Visits (Telemedicine).  Patients are able to view lab/test results, encounter notes, upcoming appointments, etc.  Non-urgent messages can be sent to your provider as well.   To learn more about what you can do with MyChart, go to [email or web address].    Your next appointment:   Follow up as scheduled  06/25/20 at 10:00 AM  The format for your next appointment:   In Person  Provider:   Quay Burow, MD  Other Instructions
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
## 2020-04-21 ENCOUNTER — Encounter: Payer: Self-pay | Admitting: Physician Assistant

## 2020-05-28 DIAGNOSIS — I10 Essential (primary) hypertension: Secondary | ICD-10-CM | POA: Diagnosis not present

## 2020-05-29 LAB — BASIC METABOLIC PANEL
BUN/Creatinine Ratio: 20 (ref 10–24)
BUN: 25 mg/dL (ref 8–27)
CO2: 21 mmol/L (ref 20–29)
Calcium: 9.1 mg/dL (ref 8.6–10.2)
Chloride: 105 mmol/L (ref 96–106)
Creatinine, Ser: 1.23 mg/dL (ref 0.76–1.27)
Glucose: 101 mg/dL — ABNORMAL HIGH (ref 65–99)
Potassium: 5.1 mmol/L (ref 3.5–5.2)
Sodium: 142 mmol/L (ref 134–144)
eGFR: 58 mL/min/{1.73_m2} — ABNORMAL LOW (ref >59)

## 2020-05-29 LAB — MICROALBUMIN / CREATININE URINE RATIO
Creatinine, Urine: 68.1 mg/dL
Microalb/Creat Ratio: 47 mg/g creat — ABNORMAL HIGH (ref 0–29)
Microalbumin, Urine: 32 ug/mL

## 2020-06-03 ENCOUNTER — Other Ambulatory Visit: Payer: Self-pay | Admitting: Family Medicine

## 2020-06-03 ENCOUNTER — Other Ambulatory Visit: Payer: Self-pay | Admitting: Cardiovascular Disease

## 2020-06-04 ENCOUNTER — Other Ambulatory Visit: Payer: Self-pay

## 2020-06-04 ENCOUNTER — Encounter: Payer: Self-pay | Admitting: Family Medicine

## 2020-06-04 ENCOUNTER — Ambulatory Visit (INDEPENDENT_AMBULATORY_CARE_PROVIDER_SITE_OTHER): Payer: PPO | Admitting: Family Medicine

## 2020-06-04 ENCOUNTER — Telehealth: Payer: Self-pay

## 2020-06-04 VITALS — BP 134/84 | Temp 97.4°F | Ht 68.0 in | Wt 175.2 lb

## 2020-06-04 DIAGNOSIS — I10 Essential (primary) hypertension: Secondary | ICD-10-CM | POA: Diagnosis not present

## 2020-06-04 DIAGNOSIS — L299 Pruritus, unspecified: Secondary | ICD-10-CM | POA: Diagnosis not present

## 2020-06-04 MED ORDER — MOMETASONE FUROATE 0.1 % EX CREA: TOPICAL_CREAM | CUTANEOUS | 3 refills | Status: DC

## 2020-06-04 NOTE — Telephone Encounter (Signed)
Seen today and med last sent in under Dr. Dwyane Dee on 02/21/20 with 3 additional refills

## 2020-06-04 NOTE — Progress Notes (Signed)
Subjective:    Patient ID: [name] [name], male    DOB: [birth year], 86 y.o.   MRN: [id number]  Hypertension This is a chronic problem. The current episode started more than 1 year ago. Pertinent negatives include no chest pain, headaches or shortness of breath.  He states he is overall doing well trying to eat relatively healthy take his medicines as directed he states he has not had any falls in the past several months.  Energy level is okay Fall Risk  01/19/2020 09/20/2019 06/14/2019 04/12/2019 02/21/2019  Falls in the past year? 1 0 1 0 0  Comment - - - - [place] Telephone Survey: data to providers prior to load  Number falls in past yr: 0 - 1 - -  Injury with Fall? 1 - 0 - -  Comment hurt arm with fall. healed now. no issues now with arm - - - -  Risk for fall due to : History of fall(s) History of fall(s) - - -  Follow up Falls evaluation completed Falls evaluation completed Falls evaluation completed;Education provided Falls evaluation completed -    Patient would like to discuss recent labs Patient also still having trouble with itching and night itching- scratching sores on body. Results for orders placed or performed in visit on 56/97/94  Basic metabolic panel  Result Value Ref Range   Glucose 101 (H) 65 - 99 mg/dL   BUN 25 8 - 27 mg/dL   Creatinine, Ser 1.23 0.76 - 1.27 mg/dL   eGFR 58 (L) >59 mL/min/1.73   BUN/Creatinine Ratio 20 10 - 24   Sodium 142 134 - 144 mmol/L   Potassium 5.1 3.5 - 5.2 mmol/L   Chloride 105 96 - 106 mmol/L   CO2 21 20 - 29 mmol/L   Calcium 9.1 8.6 - 10.2 mg/dL  Microalbumin / creatinine urine ratio  Result Value Ref Range   Creatinine, Urine 68.1 Not Estab. mg/dL   Microalbumin, Urine 32.0 Not Estab. ug/mL   Microalb/Creat Ratio 47 (H) 0 - 29 mg/g creat   Has tried numerous OTC measures including triamcinolone for itching without help  Review of Systems  Constitutional: Negative for activity change.       Ithching a lot  HENT: Negative for  congestion and rhinorrhea.   Respiratory: Negative for cough and shortness of breath.   Cardiovascular: Negative for chest pain.  Gastrointestinal: Negative for abdominal pain, diarrhea, nausea and vomiting.  Genitourinary: Negative for dysuria and hematuria.  Neurological: Negative for weakness and headaches.  Psychiatric/Behavioral: Negative for behavioral problems and confusion.       Objective:   Physical Exam Vitals reviewed.  Constitutional:      General: He is not in acute distress.    Appearance: He is well-nourished.  HENT:     Head: Normocephalic and atraumatic.  Eyes:     General:        Right eye: No discharge.        Left eye: No discharge.  Neck:     Trachea: No tracheal deviation.  Cardiovascular:     Rate and Rhythm: Normal rate and regular rhythm.     Heart sounds: Normal heart sounds. No murmur heard.   Pulmonary:     Effort: Pulmonary effort is normal. No respiratory distress.     Breath sounds: Normal breath sounds.  Musculoskeletal:        General: No edema.  Lymphadenopathy:     Cervical: No cervical adenopathy.  Skin:  General: Skin is warm and dry.  Neurological:     Mental Status: He is alert.     Coordination: Coordination normal.  Psychiatric:        Mood and Affect: Mood and affect normal.        Behavior: Behavior normal.    Has erythematous areas on both sides of the abdomen from excoriated areas from itching       Assessment & Plan:  Referral to dermatology recommend Elocon cream follow-up here as needed  Blood pressure overall good control continue current measures no additional labs needed follow-up in 5 to 6 months

## 2020-06-04 NOTE — Telephone Encounter (Signed)
Pt called and said Pharmacy said Dr Nicki Reaper would not fill furosemide (LASIX) 20 MG tablet and pt is wanting to know why he he is coming off this medication?  Pt call back [phone]

## 2020-06-05 ENCOUNTER — Other Ambulatory Visit: Payer: Self-pay | Admitting: *Deleted

## 2020-06-05 MED ORDER — FUROSEMIDE 20 MG PO TABS: 20.0000 mg | ORAL_TABLET | Freq: Every day | ORAL | 1 refills | Status: DC

## 2020-06-05 NOTE — Telephone Encounter (Signed)
Pt is returning phone call pt out of meds and concerned

## 2020-06-05 NOTE — Telephone Encounter (Signed)
Left message to return call 

## 2020-06-05 NOTE — Telephone Encounter (Signed)
May have 6 months on furosemide Please make sure there is no other concerns he has currently I think it is perfectly fine for him to continue with his medication.

## 2020-06-05 NOTE — Telephone Encounter (Signed)
Pt wife returned call. Wife was wanting to know if patient should be on med. Informed wife that Dr.Scott states it is fine for pt to continue meds. Refills sent to [facility]. Wife verbalized understanding.

## 2020-06-05 NOTE — Telephone Encounter (Signed)
Please advise. Thank you

## 2020-06-06 ENCOUNTER — Other Ambulatory Visit: Payer: Self-pay | Admitting: Family Medicine

## 2020-06-15 ENCOUNTER — Other Ambulatory Visit: Payer: Self-pay | Admitting: Family Medicine

## 2020-06-18 ENCOUNTER — Ambulatory Visit: Payer: PPO | Admitting: Family Medicine

## 2020-06-19 ENCOUNTER — Other Ambulatory Visit: Payer: Self-pay | Admitting: Family Medicine

## 2020-06-19 NOTE — Telephone Encounter (Signed)
Seen 06/04/20 for htn. Med last prescribed by a different provider

## 2020-06-19 NOTE — Telephone Encounter (Signed)
1. Before we will sign off on this please verify with patient that he is taking this, if he is taking it we can do 90-day with 1 refill

## 2020-06-20 NOTE — Telephone Encounter (Signed)
Pt is taking lisinopril 20mg 

## 2020-06-25 ENCOUNTER — Other Ambulatory Visit: Payer: Self-pay

## 2020-06-25 ENCOUNTER — Encounter: Payer: Self-pay | Admitting: Cardiovascular Disease

## 2020-06-25 ENCOUNTER — Ambulatory Visit: Payer: PPO | Admitting: Cardiovascular Disease

## 2020-06-25 VITALS — BP 156/87 | HR 60 | Ht 69.0 in | Wt 174.6 lb

## 2020-06-25 DIAGNOSIS — I739 Peripheral vascular disease, unspecified: Secondary | ICD-10-CM

## 2020-06-25 DIAGNOSIS — I2102 ST elevation (STEMI) myocardial infarction involving left anterior descending coronary artery: Secondary | ICD-10-CM | POA: Diagnosis not present

## 2020-06-25 DIAGNOSIS — E782 Mixed hyperlipidemia: Secondary | ICD-10-CM | POA: Diagnosis not present

## 2020-06-25 DIAGNOSIS — I5033 Acute on chronic diastolic (congestive) heart failure: Secondary | ICD-10-CM | POA: Diagnosis not present

## 2020-06-25 DIAGNOSIS — I714 Abdominal aortic aneurysm, without rupture, unspecified: Secondary | ICD-10-CM

## 2020-06-25 DIAGNOSIS — I6522 Occlusion and stenosis of left carotid artery: Secondary | ICD-10-CM | POA: Diagnosis not present

## 2020-06-25 NOTE — Assessment & Plan Note (Signed)
History of essential hypertension a blood pressure measured today at 156/87.  Normally his blood pressure at home is in the 140/80 range.  He is on amlodipine, hydralazine and lisinopril.

## 2020-06-25 NOTE — Progress Notes (Signed)
06/25/2020 [name] [name]   01-07-1935  [id number]  Primary Physician Kathyrn Drown, MD Primary Cardiologist: Lorretta Harp MD FACP, [place], [place], [name]  HPI:  [name] [name] is a 86 y.o.   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 I last saw him4/20/2021.  He is accompanied by his wife today... He was admitted on 07/02/13 for Anterior STEMI. Presenting symptoms included severe epigastric burning pain/ indigestion and left arm pain. Initial troponin was >20.0. He underwent emergent LHC and successful aspiration thrombectomy, PCI and stenting of proximal LAD by [name], via the right femoral artery . The overall LVEF was estimated at 50%, without wall motion abnormalities. He tolerated the procedure well and left the cath lab in stable condition. DAPT with ASA and Brilinta was initiated. He was also placed on a BB, ACE-I and statin. His post PCI course was w/o complication. He was discharged home on 07/05/13.  It should also be noted that at time of emergent LHC, he was found to also have significant peripheral vascular disease with an occluded left iliac and high-grade right common iliac artery stenosis, as well as a small AAA. The patient complained of bilateral LEE. [name] ordered for him to undergo bilateral LEAs. The study was performed in our office on 07/18/13. Findings are as follows: ABI 0.95 on Rt, 0.59 on Lt.the patient underwent angiography, diamondback orbital rotational arthrectomy, PTA and stenting of a highly calcified ostial right common iliac artery stenosis. He had excellent angiographic and clinical result. His Dopplers normalized to no longer has right lower extremity claudication. He wishes to proceed with attempt at percutaneous opacification of his left common iliac chronic total occlusion. He is not tolerating his Brilenta because of shortness of breath and we subsequently  transitioned to Plavix. He had a left brain stroke thought to be related to a watershed infarct from a hemodynamically significant right carotid stenosis in the setting of an occluded left carotid. He was evaluated by Dr. Erlinda Hong from [facility]. Dr. Erlinda Hong thought he would be a candidate for carotid stenting should he be found have a right internal carotid artery stenosis of greater than 70%.I intubated him on 05/15/14 revealing at most a 40-50% smooth right internal artery stenosis. I suspect his carotid ultrasound overestimated the degree of stenosis because the contralateral occlusion.  Since I saw him a year ago he's remained stable. He denies chest pain, shortness of breath or claudication. He does want to have a left total knee replacement. A pharmacologic Myoview stress test was intermediate risk with scar and peri-infarct ischemia. His EF is in the 45-55% range. Because of progressively worsening Doppler studies a CT scan scan was performed that showed progression of his right internal carotid artery stenosis compared to his prior CTA. He did have some right common carotid origin stenosis as well. He was neurologically asymptomatic. I did angiogram him approximately 18 months ago revealing at most 50-60% right ICA stenosis. I suspected that the Dopplers overestimated the degree of stenosis because of contralateral occlusion. Because of progression of disease he underwent angiography by myself 10/29/15 with the intent to perform right internal carotid artery stenting. Unfortunately, because of the degree of calcification and tortuosity and was never able to safely access the right common carotid artery and therefore aborted the procedure. The patient also underwent uncomplicated right carotid endarterectomy by Dr. Trula Slade 11/27/15 which he has nicely recovered  from.Recent carotid Dopplers performed 01/15/16 revealed a widely patent right carotid endarterectomy site.  Since I saw me year ago he's  remained stable.  He was admitted for 2 days in November with acute on chronic diastolic heart failure and was diuresed.  He was hypertensive and had chest pain at that time but MI was ruled out.  He currently denies chest pain, shortness of breath or claudication.   Current Meds  Medication Sig  . acetaminophen (TYLENOL) 500 MG tablet Take 500 mg by mouth every 6 (six) hours as needed for mild pain or moderate pain.   [name] amLODipine (NORVASC) 10 MG tablet Take 1 tablet (10 mg total) by mouth daily.  [name] aspirin EC 81 MG tablet Take 81 mg by mouth at bedtime.   [name] atorvastatin (LIPITOR) 20 MG tablet TAKE 1 TABLET(20 MG) BY MOUTH DAILY  . clopidogrel (PLAVIX) 75 MG tablet Take 1 tablet (75 mg total) by mouth daily.  . furosemide (LASIX) 20 MG tablet Take 1 tablet (20 mg total) by mouth daily.  . hydrALAZINE (APRESOLINE) 25 MG tablet TAKE 1 TABLET BY MOUTH 3 TIMES A DAY DO NOT TAKE IF BP LESS THAN 90/60  . hydrALAZINE (APRESOLINE) 50 MG tablet Take 1 tablet (50 mg total) by mouth 3 (three) times daily. Do not take if blood pressure less than 90/60  . lisinopril (ZESTRIL) 20 MG tablet TAKE 1 TABLET(20 MG) BY MOUTH DAILY  . methylcellulose (ARTIFICIAL TEARS) 1 % ophthalmic solution Place 1 drop into both eyes as needed (for dry eyes).  . metoprolol tartrate (LOPRESSOR) 50 MG tablet Take 1 tablet (50 mg total) by mouth 2 (two) times daily.  . mometasone (ELOCON) 0.1 % cream Apply to affected area twice daily  . nitroGLYCERIN (NITROSTAT) 0.4 MG SL tablet Place 1 tablet (0.4 mg total) under the tongue every 5 (five) minutes as needed for chest pain.  . pantoprazole (PROTONIX) 20 MG tablet Take 1 tablet (20 mg total) by mouth 2 (two) times daily.  . tamsulosin (FLOMAX) 0.4 MG CAPS capsule TAKE 1 CAPSULE(0.4 MG) BY MOUTH DAILY  . traMADol (ULTRAM) 50 MG tablet TAKE 1 TABLET BY MOUTH THREE TIMES DAILY AS NEEDED FOR MODERATE PAIN     Allergies  Allergen Reactions  . Brilinta [Ticagrelor] Shortness Of Breath  and Other (See Comments)    VERTIGO, also  . Influenza Vaccines Other (See Comments)    Pt states he has been hospitalized both times he was given flu vaccine as a younger adult while in the [place] and they told him not to take it again  . Lipitor [Atorvastatin] Other (See Comments)    JOINT PAIN- tolerates if taking CO-Q 10  . Fluoride Preparations Other (See Comments)    Reaction not recalled    Social History   Socioeconomic History  . Marital status: Married    Spouse name: Not on file  . Number of children: 3  . Years of education: ASSOCIATES  . Highest education level: Not on file  Occupational History  . Occupation: Retired  Tobacco Use  . Smoking status: Never Smoker  . Smokeless tobacco: Never Used  Substance and Sexual Activity  . Alcohol use: No    Alcohol/week: 0.0 standard drinks  . Drug use: No  . Sexual activity: Not Currently  Other Topics Concern  . Not on file  Social History Narrative   Patient is married with 3 children.   Patient is right handed.   Patient has his Associates degree.  Patient drinks 2-3 cups daily.   Social Determinants of Health   Financial Resource Strain: Not on file  Food Insecurity: Not on file  Transportation Needs: Not on file  Physical Activity: Not on file  Stress: Not on file  Social Connections: Not on file  Intimate Partner Violence: Not on file     Review of Systems: General: negative for chills, fever, night sweats or weight changes.  Cardiovascular: negative for chest pain, dyspnea on exertion, edema, orthopnea, palpitations, paroxysmal nocturnal dyspnea or shortness of breath Dermatological: negative for rash Respiratory: negative for cough or wheezing Urologic: negative for hematuria Abdominal: negative for nausea, vomiting, diarrhea, bright red blood per rectum, melena, or hematemesis Neurologic: negative for visual changes, syncope, or dizziness All other systems reviewed and are otherwise negative except as  noted above.    Blood pressure (!) 156/87, pulse 60, height 5\' 9"  (1.753 m), weight 174 lb 9.6 oz (79.2 kg), SpO2 97 %.  General appearance: alert and no distress Neck: no adenopathy, no carotid bruit, no JVD, supple, symmetrical, trachea midline and thyroid not enlarged, symmetric, no tenderness/mass/nodules Lungs: clear to auscultation bilaterally Heart: regular rate and rhythm, S1, S2 normal, no murmur, click, rub or gallop Extremities: extremities normal, atraumatic, no cyanosis or edema Pulses: 2+ and symmetric Skin: Skin color, texture, turgor normal. No rashes or lesions Neurologic: Alert and oriented X 3, normal strength and tone. Normal symmetric reflexes. Normal coordination and gait  EKG not performed today  ASSESSMENT AND PLAN:   Hyperlipidemia History of hyperlipidemia on statin therapy with lipid profile performed 01/22/2020 revealing a total cholesterol 91, LDL 41 and HDL 28.  Essential hypertension, benign History of essential hypertension a blood pressure measured today at 156/87.  Normally his blood pressure at home is in the 140/80 range.  He is on amlodipine, hydralazine and lisinopril.  Occlusion and stenosis of carotid artery with cerebral infarction History of known carotid disease with an occluded left common carotid status post right carotid endarterectomy with Dr. Trula Slade.  His most recent carotid Doppler studies performed 10/09/2019 revealed a widely patent right carotid endarterectomy site.  This will be repeated on annual basis.  STEMI (ST elevation myocardial infarction) History of anterior STEMI 07/02/2013 presenting with epigastric burning and indigestion with left arm pain.  He underwent successful emergency cath and intervention along with aspiration thrombectomy, PCI and drug-eluting stenting of the proximal LAD by myself via the right femoral approach.  He has been asymptomatic since.  Peripheral vascular disease ([place]) History of peripheral arterial  disease status post bilateral iliac intervention of a high-grade calcified right common iliac artery and left iliac CTO back in 2015.  His most recent Doppler studies performed 10/09/2019 revealed patent iliac stents.  We will repeat this this coming July.  AAA (abdominal aortic aneurysm) Known small abdominal aortic aneurysm.  We will recheck abdominal ultrasound  Acute on chronic diastolic CHF (congestive heart failure) ([place]) Recent admission in November for diastolic heart failure.  She was diuresed 15 to 20 pounds at that time.  He is on oral diuretics and weighs himself on a daily basis.      Lorretta Harp MD FACP,FACC,FAHA, [facility] 06/25/2020 10:26 AM

## 2020-06-25 NOTE — Assessment & Plan Note (Signed)
History of known carotid disease with an occluded left common carotid status post right carotid endarterectomy with Dr. Trula Slade.  His most recent carotid Doppler studies performed 10/09/2019 revealed a widely patent right carotid endarterectomy site.  This will be repeated on annual basis.

## 2020-06-25 NOTE — Assessment & Plan Note (Signed)
History of peripheral arterial disease status post bilateral iliac intervention of a high-grade calcified right common iliac artery and left iliac CTO back in 2015.  His most recent Doppler studies performed 10/09/2019 revealed patent iliac stents.  We will repeat this this coming July.

## 2020-06-25 NOTE — Assessment & Plan Note (Signed)
History of anterior STEMI 07/02/2013 presenting with epigastric burning and indigestion with left arm pain.  He underwent successful emergency cath and intervention along with aspiration thrombectomy, PCI and drug-eluting stenting of the proximal LAD by myself via the right femoral approach.  He has been asymptomatic since.

## 2020-06-25 NOTE — Assessment & Plan Note (Signed)
Recent admission in November for diastolic heart failure.  She was diuresed 15 to 20 pounds at that time.  He is on oral diuretics and weighs himself on a daily basis.

## 2020-06-25 NOTE — Assessment & Plan Note (Signed)
Known small abdominal aortic aneurysm.  We will recheck abdominal ultrasound

## 2020-06-25 NOTE — Patient Instructions (Signed)
Medication Instructions:  Your physician recommends that you continue on your current medications as directed. Please refer to the Current Medication list given to you today.  *If you need a refill on your cardiac medications before your next appointment, please call your pharmacy*   Testing/Procedures: [name] has recommended that you have an Ultrasound of your AORTA/IVC/ILIACS.   To prepare for this test:  . No food after 11PM the night before. Water is OK. (Don't drink liquids if you have been instructed not to for ANOTHER test).  . Avoid foods that produce bowel gas, for 24 hours prior to exam (see below). . No breakfast, no chewing gum, no smoking or carbonated beverages. . Patient may take morning medications with water. . Come in for test at least 15 minutes early to register.  Your physician has requested that you have an ankle brachial index (ABI). During this test an ultrasound and blood pressure cuff are used to evaluate the arteries that supply the arms and legs with blood. Allow thirty minutes for this exam. There are no restrictions or special instructions.  These procedures are done at [facility]. 2nd Floor. To be done July 2022.  Follow-Up: At [facility], you and your health needs are our priority.  As part of our continuing mission to provide you with exceptional heart care, we have created designated Provider Care Teams.  These Care Teams include your primary Cardiologist (physician) and Advanced Practice Providers (APPs -  Physician Assistants and Nurse Practitioners) who all work together to provide you with the care you need, when you need it.  We recommend signing up for the patient portal called "MyChart".  Sign up information is provided on this After Visit Summary.  MyChart is used to connect with patients for Virtual Visits (Telemedicine).  Patients are able to view lab/test results, encounter notes, upcoming appointments, etc.  Non-urgent messages can  be sent to your provider as well.   To learn more about what you can do with MyChart, go to [email or web address].    Your next appointment:   6 month(s)  The format for your next appointment:   In Person  Provider:   You will see one of the following Advanced Practice Providers on your designated Care Team:    Almyra Deforest, PA-C  Then, Quay Burow, MD will plan to see you again in 12 month(s).

## 2020-06-25 NOTE — Assessment & Plan Note (Signed)
History of hyperlipidemia on statin therapy with lipid profile performed 01/22/2020 revealing a total cholesterol 91, LDL 41 and HDL 28.

## 2020-06-28 ENCOUNTER — Telehealth: Payer: Self-pay | Admitting: Cardiovascular Disease

## 2020-06-28 NOTE — Telephone Encounter (Signed)
Pt c/o medication issue:  1. Name of Medication:  hydrALAZINE (APRESOLINE) 25 MG tablet  2. How are you currently taking this medication (dosage and times per day)?  Per [name] [name], patient has been prescribed 1 tablet by mouth 3 times daily unless his BP gets below 90/60  3. Are you having a reaction (difficulty breathing--STAT)? No   4. What is your medication issue?   [name] [name] with [place] would like to ensure that [name] is aware that [name] prescribed hydralazine 25 MG. She states she is aware [name] prescribed 50 MG in the past and she wanted to ensure he is aware prior to distributing.  Phone #: [phone] Reference #: [id number]

## 2020-06-29 NOTE — Telephone Encounter (Signed)
For some reason he has been on both doses according to my last note. I'm OK with the lower dose as long as he tracks his BP at home

## 2020-07-02 NOTE — Telephone Encounter (Signed)
Spoke with Santiago Glad, reference # given and clarified that [name] would like the pt to continue taking the hydralazine 25mg  three times a day and discontinue the 50mg  hydralazine. Santiago Glad verbalizes understanding and will make changes to pts prescriptions.   Spoke with pt's wife, Gottlieb Zuercher (ok per [facility]) to clarify prescription order. Pamala Hurry verifies that pt is taking hydralazine 25mg  three times a day. All questions answered at this time.

## 2020-07-10 ENCOUNTER — Other Ambulatory Visit: Payer: Self-pay

## 2020-07-10 MED ORDER — PANTOPRAZOLE SODIUM 20 MG PO TBEC: 20.0000 mg | DELAYED_RELEASE_TABLET | Freq: Two times a day (BID) | ORAL | 1 refills | Status: DC

## 2020-07-23 ENCOUNTER — Other Ambulatory Visit: Payer: Self-pay

## 2020-07-23 MED ORDER — CLOPIDOGREL BISULFATE 75 MG PO TABS: 75.0000 mg | ORAL_TABLET | Freq: Every day | ORAL | 0 refills | Status: DC

## 2020-07-23 MED ORDER — AMLODIPINE BESYLATE 10 MG PO TABS: 10.0000 mg | ORAL_TABLET | Freq: Every day | ORAL | 0 refills | Status: DC

## 2020-08-15 ENCOUNTER — Inpatient Hospital Stay (HOSPITAL_COMMUNITY)
Admission: EM | Admit: 2020-08-15 | Discharge: 2020-08-18 | DRG: 291 | Disposition: A | Discharge status: Home / Self Care | Payer: PPO | Attending: Internal Medicine | Admitting: Internal Medicine

## 2020-08-15 ENCOUNTER — Emergency Department (HOSPITAL_COMMUNITY): Payer: PPO

## 2020-08-15 ENCOUNTER — Other Ambulatory Visit: Payer: Self-pay

## 2020-08-15 DIAGNOSIS — D696 Thrombocytopenia, unspecified: Secondary | ICD-10-CM | POA: Diagnosis present

## 2020-08-15 DIAGNOSIS — Z888 Allergy status to other drugs, medicaments and biological substances status: Secondary | ICD-10-CM | POA: Diagnosis not present

## 2020-08-15 DIAGNOSIS — H9190 Unspecified hearing loss, unspecified ear: Secondary | ICD-10-CM | POA: Diagnosis not present

## 2020-08-15 DIAGNOSIS — Z955 Presence of coronary angioplasty implant and graft: Secondary | ICD-10-CM

## 2020-08-15 DIAGNOSIS — Z9841 Cataract extraction status, right eye: Secondary | ICD-10-CM

## 2020-08-15 DIAGNOSIS — I5032 Chronic diastolic (congestive) heart failure: Secondary | ICD-10-CM | POA: Diagnosis present

## 2020-08-15 DIAGNOSIS — J9811 Atelectasis: Secondary | ICD-10-CM | POA: Diagnosis not present

## 2020-08-15 DIAGNOSIS — I110 Hypertensive heart disease with heart failure: Secondary | ICD-10-CM | POA: Diagnosis not present

## 2020-08-15 DIAGNOSIS — R0902 Hypoxemia: Secondary | ICD-10-CM | POA: Diagnosis present

## 2020-08-15 DIAGNOSIS — Z8719 Personal history of other diseases of the digestive system: Secondary | ICD-10-CM | POA: Diagnosis not present

## 2020-08-15 DIAGNOSIS — Z833 Family history of diabetes mellitus: Secondary | ICD-10-CM

## 2020-08-15 DIAGNOSIS — Z809 Family history of malignant neoplasm, unspecified: Secondary | ICD-10-CM

## 2020-08-15 DIAGNOSIS — I959 Hypotension, unspecified: Secondary | ICD-10-CM | POA: Diagnosis not present

## 2020-08-15 DIAGNOSIS — I5033 Acute on chronic diastolic (congestive) heart failure: Secondary | ICD-10-CM | POA: Diagnosis present

## 2020-08-15 DIAGNOSIS — I509 Heart failure, unspecified: Secondary | ICD-10-CM | POA: Diagnosis not present

## 2020-08-15 DIAGNOSIS — Z8673 Personal history of transient ischemic attack (TIA), and cerebral infarction without residual deficits: Secondary | ICD-10-CM | POA: Diagnosis not present

## 2020-08-15 DIAGNOSIS — Z20822 Contact with and (suspected) exposure to covid-19: Secondary | ICD-10-CM | POA: Diagnosis not present

## 2020-08-15 DIAGNOSIS — N179 Acute kidney failure, unspecified: Secondary | ICD-10-CM | POA: Diagnosis not present

## 2020-08-15 DIAGNOSIS — N400 Enlarged prostate without lower urinary tract symptoms: Secondary | ICD-10-CM | POA: Diagnosis not present

## 2020-08-15 DIAGNOSIS — Z7902 Long term (current) use of antithrombotics/antiplatelets: Secondary | ICD-10-CM

## 2020-08-15 DIAGNOSIS — I739 Peripheral vascular disease, unspecified: Secondary | ICD-10-CM | POA: Diagnosis not present

## 2020-08-15 DIAGNOSIS — E785 Hyperlipidemia, unspecified: Secondary | ICD-10-CM | POA: Diagnosis not present

## 2020-08-15 DIAGNOSIS — R062 Wheezing: Secondary | ICD-10-CM | POA: Diagnosis not present

## 2020-08-15 DIAGNOSIS — Z83438 Family history of other disorder of lipoprotein metabolism and other lipidemia: Secondary | ICD-10-CM | POA: Diagnosis not present

## 2020-08-15 DIAGNOSIS — Z961 Presence of intraocular lens: Secondary | ICD-10-CM | POA: Diagnosis present

## 2020-08-15 DIAGNOSIS — I252 Old myocardial infarction: Secondary | ICD-10-CM | POA: Diagnosis not present

## 2020-08-15 DIAGNOSIS — R064 Hyperventilation: Secondary | ICD-10-CM | POA: Diagnosis not present

## 2020-08-15 DIAGNOSIS — R0602 Shortness of breath: Secondary | ICD-10-CM | POA: Diagnosis not present

## 2020-08-15 DIAGNOSIS — I2510 Atherosclerotic heart disease of native coronary artery without angina pectoris: Secondary | ICD-10-CM | POA: Diagnosis not present

## 2020-08-15 DIAGNOSIS — I517 Cardiomegaly: Secondary | ICD-10-CM | POA: Diagnosis not present

## 2020-08-15 DIAGNOSIS — Z8249 Family history of ischemic heart disease and other diseases of the circulatory system: Secondary | ICD-10-CM | POA: Diagnosis not present

## 2020-08-15 DIAGNOSIS — I161 Hypertensive emergency: Secondary | ICD-10-CM | POA: Diagnosis not present

## 2020-08-15 DIAGNOSIS — Z7982 Long term (current) use of aspirin: Secondary | ICD-10-CM

## 2020-08-15 DIAGNOSIS — I708 Atherosclerosis of other arteries: Secondary | ICD-10-CM | POA: Diagnosis present

## 2020-08-15 DIAGNOSIS — Z9842 Cataract extraction status, left eye: Secondary | ICD-10-CM | POA: Diagnosis not present

## 2020-08-15 DIAGNOSIS — K219 Gastro-esophageal reflux disease without esophagitis: Secondary | ICD-10-CM | POA: Diagnosis not present

## 2020-08-15 DIAGNOSIS — J811 Chronic pulmonary edema: Secondary | ICD-10-CM | POA: Diagnosis not present

## 2020-08-15 DIAGNOSIS — I5042 Chronic combined systolic (congestive) and diastolic (congestive) heart failure: Secondary | ICD-10-CM | POA: Diagnosis present

## 2020-08-15 DIAGNOSIS — Z79899 Other long term (current) drug therapy: Secondary | ICD-10-CM

## 2020-08-15 LAB — CBC
HCT: 39.1 % (ref 39.0–52.0)
Hemoglobin: 12.2 g/dL — ABNORMAL LOW (ref 13.0–17.0)
MCH: 27.4 pg (ref 26.0–34.0)
MCHC: 31.2 g/dL (ref 30.0–36.0)
MCV: 87.9 fL (ref 80.0–100.0)
Platelets: 117 10*3/uL — ABNORMAL LOW (ref 150–400)
RBC: 4.45 MIL/uL (ref 4.22–5.81)
RDW: 15.1 % (ref 11.5–15.5)
WBC: 8.1 10*3/uL (ref 4.0–10.5)
nRBC: 0 % (ref 0.0–0.2)

## 2020-08-15 LAB — RESP PANEL BY RT-PCR (FLU A&B, COVID) ARPGX2
Influenza A by PCR: NEGATIVE
Influenza B by PCR: NEGATIVE
SARS Coronavirus 2 by RT PCR: NEGATIVE

## 2020-08-15 LAB — COMPREHENSIVE METABOLIC PANEL
ALT: 10 U/L (ref 0–44)
AST: 17 U/L (ref 15–41)
Albumin: 3.4 g/dL — ABNORMAL LOW (ref 3.5–5.0)
Alkaline Phosphatase: 49 U/L (ref 38–126)
Anion gap: 7 (ref 5–15)
BUN: 24 mg/dL — ABNORMAL HIGH (ref 8–23)
CO2: 21 mmol/L — ABNORMAL LOW (ref 22–32)
Calcium: 8.2 mg/dL — ABNORMAL LOW (ref 8.9–10.3)
Chloride: 110 mmol/L (ref 98–111)
Creatinine, Ser: 1.21 mg/dL (ref 0.61–1.24)
GFR, Estimated: 59 mL/min — ABNORMAL LOW (ref >60)
Glucose, Bld: 108 mg/dL — ABNORMAL HIGH (ref 70–99)
Potassium: 4.9 mmol/L (ref 3.5–5.1)
Sodium: 138 mmol/L (ref 135–145)
Total Bilirubin: 0.5 mg/dL (ref 0.3–1.2)
Total Protein: 5.6 g/dL — ABNORMAL LOW (ref 6.5–8.1)

## 2020-08-15 LAB — BRAIN NATRIURETIC PEPTIDE: B Natriuretic Peptide: 1010.2 pg/mL — ABNORMAL HIGH (ref 0.0–100.0)

## 2020-08-15 LAB — TROPONIN I (HIGH SENSITIVITY): Troponin I (High Sensitivity): 18 ng/L — ABNORMAL HIGH (ref <18)

## 2020-08-15 MED ORDER — MOMETASONE FUROATE 0.1 % EX CREA
1.0000 "application " | TOPICAL_CREAM | Freq: Two times a day (BID) | CUTANEOUS | Status: DC
  Administered 2020-08-16 – 2020-08-18 (×5): 1 via TOPICAL
  Filled 2020-08-15 (×2): qty 15

## 2020-08-15 MED ORDER — ONDANSETRON HCL 4 MG/2ML IJ SOLN: 4.0000 mg | Freq: Four times a day (QID) | INTRAMUSCULAR | Status: DC | PRN

## 2020-08-15 MED ORDER — AMLODIPINE BESYLATE 10 MG PO TABS
10.0000 mg | ORAL_TABLET | Freq: Every day | ORAL | Status: DC
  Administered 2020-08-16 – 2020-08-18 (×3): 10 mg via ORAL
  Filled 2020-08-15 (×3): qty 1

## 2020-08-15 MED ORDER — ACETAMINOPHEN 500 MG PO TABS: 500.0000 mg | ORAL_TABLET | Freq: Four times a day (QID) | ORAL | Status: DC | PRN

## 2020-08-15 MED ORDER — TAMSULOSIN HCL 0.4 MG PO CAPS
0.4000 mg | ORAL_CAPSULE | Freq: Every day | ORAL | Status: DC
  Administered 2020-08-16 – 2020-08-18 (×3): 0.4 mg via ORAL
  Filled 2020-08-15 (×3): qty 1

## 2020-08-15 MED ORDER — ACETAMINOPHEN 325 MG PO TABS
650.0000 mg | ORAL_TABLET | ORAL | Status: DC | PRN
  Administered 2020-08-16 (×2): 650 mg via ORAL
  Filled 2020-08-15 (×3): qty 2

## 2020-08-15 MED ORDER — HYDRALAZINE HCL 50 MG PO TABS
50.0000 mg | ORAL_TABLET | Freq: Three times a day (TID) | ORAL | Status: DC
  Administered 2020-08-15 – 2020-08-17 (×5): 50 mg via ORAL
  Filled 2020-08-15: qty 2
  Filled 2020-08-15 (×4): qty 1

## 2020-08-15 MED ORDER — CARVEDILOL 3.125 MG PO TABS
3.1250 mg | ORAL_TABLET | Freq: Two times a day (BID) | ORAL | Status: DC
  Administered 2020-08-15 – 2020-08-17 (×4): 3.125 mg via ORAL
  Filled 2020-08-15 (×5): qty 1

## 2020-08-15 MED ORDER — SODIUM CHLORIDE 0.9 % IV SOLN: 250.0000 mL | INTRAVENOUS | Status: DC | PRN

## 2020-08-15 MED ORDER — NITROGLYCERIN 2 % TD OINT
1.0000 [in_us] | TOPICAL_OINTMENT | Freq: Four times a day (QID) | TRANSDERMAL | Status: DC
  Administered 2020-08-15 – 2020-08-16 (×4): 1 [in_us] via TOPICAL
  Filled 2020-08-15: qty 1
  Filled 2020-08-15: qty 30

## 2020-08-15 MED ORDER — SODIUM CHLORIDE 0.9% FLUSH: 3.0000 mL | INTRAVENOUS | Status: DC | PRN

## 2020-08-15 MED ORDER — FUROSEMIDE 10 MG/ML IJ SOLN
40.0000 mg | Freq: Two times a day (BID) | INTRAMUSCULAR | Status: DC
  Administered 2020-08-15 – 2020-08-16 (×2): 40 mg via INTRAVENOUS
  Filled 2020-08-15 (×2): qty 4

## 2020-08-15 MED ORDER — ASPIRIN EC 81 MG PO TBEC
81.0000 mg | DELAYED_RELEASE_TABLET | Freq: Every day | ORAL | Status: DC
  Administered 2020-08-16 – 2020-08-18 (×3): 81 mg via ORAL
  Filled 2020-08-15 (×3): qty 1

## 2020-08-15 MED ORDER — TRAMADOL HCL 50 MG PO TABS
50.0000 mg | ORAL_TABLET | Freq: Two times a day (BID) | ORAL | Status: DC | PRN
Start: 2020-08-15 — End: 2020-08-18
  Administered 2020-08-17: 50 mg via ORAL
  Filled 2020-08-15: qty 1

## 2020-08-15 MED ORDER — LISINOPRIL 10 MG PO TABS
20.0000 mg | ORAL_TABLET | Freq: Every day | ORAL | Status: DC
  Administered 2020-08-16: 20 mg via ORAL
  Filled 2020-08-15: qty 2

## 2020-08-15 MED ORDER — SODIUM CHLORIDE 0.9% FLUSH
3.0000 mL | Freq: Two times a day (BID) | INTRAVENOUS | Status: DC
  Administered 2020-08-15 – 2020-08-17 (×4): 3 mL via INTRAVENOUS

## 2020-08-15 MED ORDER — FUROSEMIDE 10 MG/ML IJ SOLN
40.0000 mg | Freq: Once | INTRAMUSCULAR | Status: AC
  Administered 2020-08-15: 40 mg via INTRAVENOUS
  Filled 2020-08-15: qty 4

## 2020-08-15 MED ORDER — CLOPIDOGREL BISULFATE 75 MG PO TABS
75.0000 mg | ORAL_TABLET | Freq: Every day | ORAL | Status: DC
  Administered 2020-08-16 – 2020-08-18 (×3): 75 mg via ORAL
  Filled 2020-08-15 (×3): qty 1

## 2020-08-15 MED ORDER — HYDRALAZINE HCL 20 MG/ML IJ SOLN
5.0000 mg | Freq: Four times a day (QID) | INTRAMUSCULAR | Status: DC | PRN
  Administered 2020-08-17 – 2020-08-18 (×3): 5 mg via INTRAVENOUS
  Filled 2020-08-15 (×4): qty 1

## 2020-08-15 MED ORDER — PANTOPRAZOLE SODIUM 20 MG PO TBEC
20.0000 mg | DELAYED_RELEASE_TABLET | Freq: Two times a day (BID) | ORAL | Status: DC
  Administered 2020-08-15 – 2020-08-18 (×6): 20 mg via ORAL
  Filled 2020-08-15 (×6): qty 1

## 2020-08-15 MED ORDER — ENOXAPARIN SODIUM 40 MG/0.4ML IJ SOSY
40.0000 mg | PREFILLED_SYRINGE | INTRAMUSCULAR | Status: DC
  Administered 2020-08-15 – 2020-08-17 (×3): 40 mg via SUBCUTANEOUS
  Filled 2020-08-15 (×3): qty 0.4

## 2020-08-15 MED ORDER — POLYVINYL ALCOHOL 1.4 % OP SOLN
1.0000 [drp] | OPHTHALMIC | Status: DC | PRN
  Filled 2020-08-15 (×2): qty 15

## 2020-08-15 MED ORDER — ATORVASTATIN CALCIUM 10 MG PO TABS
20.0000 mg | ORAL_TABLET | Freq: Every day | ORAL | Status: DC
  Administered 2020-08-16 – 2020-08-18 (×3): 20 mg via ORAL
  Filled 2020-08-15 (×3): qty 2

## 2020-08-15 NOTE — ED Notes (Signed)
Report attempted 
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
## 2020-08-15 NOTE — ED Notes (Signed)
Report given to Tayven Renteria,RN 

## 2020-08-15 NOTE — ED Notes (Signed)
Pt placed into teletracking.

## 2020-08-15 NOTE — ED Notes (Signed)
Dr Tomi Bamberger notified of elevated systolic BP [phone].

## 2020-08-15 NOTE — ED Triage Notes (Signed)
Pt with SOB ongoing over the last month, today became worse with exertion. Pt with rales and wheezes. 2.5mg  albuterol. Denies CP. 20g L hand

## 2020-08-16 ENCOUNTER — Other Ambulatory Visit (HOSPITAL_COMMUNITY): Payer: Self-pay

## 2020-08-16 LAB — BASIC METABOLIC PANEL
Anion gap: 7 (ref 5–15)
BUN: 25 mg/dL — ABNORMAL HIGH (ref 8–23)
CO2: 27 mmol/L (ref 22–32)
Calcium: 8.6 mg/dL — ABNORMAL LOW (ref 8.9–10.3)
Chloride: 103 mmol/L (ref 98–111)
Creatinine, Ser: 1.34 mg/dL — ABNORMAL HIGH (ref 0.61–1.24)
GFR, Estimated: 53 mL/min — ABNORMAL LOW (ref >60)
Glucose, Bld: 132 mg/dL — ABNORMAL HIGH (ref 70–99)
Potassium: 3.8 mmol/L (ref 3.5–5.1)
Sodium: 137 mmol/L (ref 135–145)

## 2020-08-16 MED ORDER — LOSARTAN POTASSIUM 50 MG PO TABS: 100.0000 mg | ORAL_TABLET | Freq: Every day | ORAL | Status: DC

## 2020-08-16 MED ORDER — GUAIFENESIN-DM 100-10 MG/5ML PO SYRP: 5.0000 mL | ORAL_SOLUTION | ORAL | Status: DC | PRN

## 2020-08-16 MED ORDER — ISOSORBIDE MONONITRATE ER 30 MG PO TB24
30.0000 mg | ORAL_TABLET | Freq: Every day | ORAL | Status: DC
  Administered 2020-08-16 – 2020-08-18 (×3): 30 mg via ORAL
  Filled 2020-08-16 (×3): qty 1

## 2020-08-16 NOTE — Progress Notes (Signed)
Mobility Specialist - Progress Note   08/16/20 1432  Mobility  Activity Ambulated in hall  Level of Assistance Standby assist, set-up cues, supervision of patient - no hands on  Assistive Device Front wheel walker  Distance Ambulated (ft) 70 ft  Mobility Ambulated with assistance in hallway  Mobility Response Tolerated well  Mobility performed by Mobility specialist  $Mobility charge 1 Mobility   Distance limited by fatigue. SpO2 was 93% on RA while ambulating. Pt in chair after walk, wife in room.  Pricilla Handler Mobility Specialist Mobility Specialist Phone: [phone]

## 2020-08-16 NOTE — Progress Notes (Signed)
Patient received via stretcher alert and orin. Orin. To room R.N. into assess patient. No complaints voiced

## 2020-08-16 NOTE — Progress Notes (Signed)
Manual then taken by monitor BP taken bilateral arms per MD request for comparison.   Manual Left arm 82/60 /  Monitor BP 82/68  Manual Right arm Manual 162/82 /Monitor BP 162/71  MD notified  Phoebe Sharps, RN

## 2020-08-16 NOTE — Progress Notes (Signed)
Heart Failure Stewardship Pharmacist Progress Note   PCP: Kathyrn Drown, MD PCP-Cardiologist: Quay Burow, MD    HPI:  86 yo M with PMH of CHF, HTN, CAD, GERD, and BPH. He presented to the ED on 5/19 with shortness of breath, LE edema, and orthopnea. CXR with cardiomegaly and pulmonary vascular congestion without overt pulmonary edema. His last ECHO was done on 02/20/20 and LVEF was 50-55%.  Current HF Medications: Furosemide 40 mg IV BID Carvedilol 3.125 mg BID Lisinopril 20 mg daily Hydralazine 50 mg TID  Prior to admission HF Medications: Furosemide 20 mg daily Metoprolol tartrate 50 mg BID Lisinopril 20 mg daily Hydralazine 25 mg TID  Pertinent Lab Values: . Serum creatinine 1.34, BUN 25, Potassium 3.8, Sodium 137, BNP 1010.2  Vital Signs: . Weight: 166 lbs (admission weight: 168 lbs) . Blood pressure: 140-170/90s  . Heart rate: 70s   Medication Assistance / Insurance Benefits Check: Does the patient have prescription insurance?  Yes Type of insurance plan: Medicare [employer]  Does the patient qualify for medication assistance through manufacturers or grants?   Pending . Eligible grants and/or patient assistance programs: pending . Medication assistance applications in progress: none  . Medication assistance applications approved: none Approved medication assistance renewals will be completed by: pending  Outpatient Pharmacy:  Prior to admission outpatient pharmacy: [facility] mail order Is the patient willing to use [facility] at discharge? Yes Is the patient willing to transition their outpatient pharmacy to utilize a [facility] outpatient pharmacy?   No    Assessment: 1. Acute on chronic diastolic CHF (EF 61-95%). NYHA class III symptoms. - Continue furosemide 40 mg IV BID - Continue carvedilol 3.125 mg BID - Consider optimizing lisinopril to [facility] for HFpEF and further BP reduction. Can bridge with losartan for 36h washout period for ACE  > Entresto.  - Consider starting spironolactone 25 mg daily - Consider starting Jardiance 10 mg daily prior to discharge    Plan: 1) Medication changes recommended at this time: - Stop lisinopril and start losartan with plan to optimize to [facility] before discharge  2) Patient assistance: - Jardiance copay $45 per month  - Wilder Glade is not on patient's insurance formulary - Entresto requires prior authorization - this can   3)  Education  - To be completed prior to discharge  Kerby Nora, PharmD, BCPS Heart Failure Cytogeneticist Phone [phone]

## 2020-08-16 NOTE — Progress Notes (Signed)
[facility] Progress Note  Patient: [name] [name]    UYQ:[id number]  DOA: 08/15/2020     Date of Service: the patient was seen and examined on 08/16/2020  Brief hospital course: [name] [name] is a 86 y.o. male with medical history significant of chronic diastolic CHF, refractory HTN, CAD status post stenting, GERD, BPH, presented with increasing shortness of breath. Found to have acute on chronic diastolic CHF.  Also appears to have subclavian stenosis causing discrepancy in blood pressure Currently plan is monitor renal function.  Assessment and Plan: Acute on chronic diastolic CHF decompensation -Secondary to uncontrolled hypertension. -Signs of fluid overload on physical exam and chest x-ray, Treated with IV diuresis. -Echocardiogram not long ago, will not repeat at this time. Due to recurrent admission with heart failure with hypertension patient will benefit from outpatient work-up with cardiology.  Hypertension emergency -Responded to Nitropaste. -Titrate up hydralazine, change metoprolol to Coreg, continue amlodipine, continue ACEI.  Now on room air.  Blood pressure discrepancy in bilateral upper extremity. Likely subclavian stenosis Patient's blood pressure in right upper extremity is 638-756 systolic and blood pressure in the left upper extremity is 80 systolic. Patient does not have any complaint of chest pain or other signs concerning for dissection. Based on the CT scan done in November 2021 it appears that the patient has significant subclavian stenosis. Patient with also known history of PVD with carotid artery stenosis requiring endarterectomy. At present no further work-up needed. This was discussed with cardiology on-call. Outpatient follow-up with PCP.  GERD -On PPI  BPH -Continue Flomax  Diet: Cardiac diet DVT Prophylaxis:   enoxaparin (LOVENOX) injection 40 mg Start: 08/15/20 1800   Advance goals of care discussion: Full code  Family  Communication: family was present at bedside, at the time of interview.  The pt provided permission to discuss medical plan with the family. Opportunity was given to ask question and all questions were answered satisfactorily.   Disposition:  Status is: Inpatient  Remains inpatient appropriate because:Ongoing diagnostic testing needed not appropriate for outpatient work up   Dispo: The patient is from: Home              Anticipated d/c is to: Home              Patient currently is not medically stable to d/c.   Difficult to place patient No  Subjective: Breathing improving.  No nausea no vomiting.  Able to ambulate around the hallway.  No dizziness no lightheadedness.  No chest pain or chest tightness.  Physical Exam:  General: Appear in mild distress, no Rash; Oral Mucosa Clear, moist. no Abnormal Neck Mass Or lumps, Conjunctiva normal  Cardiovascular: S1 and S2 Present, no Murmur, Respiratory: good respiratory effort, Bilateral Air entry present and CTA, no Crackles, no wheezes Abdomen: Bowel Sound present, Soft and no tenderness Extremities: no Pedal edema Neurology: alert and oriented to time, place, and person affect appropriate. no new focal deficit Gait not checked due to patient safety concerns  Vitals:   08/16/20 0854 08/16/20 1242 08/16/20 1339 08/16/20 1601  BP: (!) 154/97 96/69 (!) 162/82 126/85  Pulse:  74 72 73  Resp:  (!) 24 18 18   Temp:  98.5 F (36.9 C)  98.6 F (37 C)  TempSrc:  Oral  Oral  SpO2:   95% 92%  Weight:      Height:        Intake/Output Summary (Last 24 hours) at 08/16/2020 2025 Last data filed at  08/16/2020 1105 Gross per 24 hour  Intake --  Output 2850 ml  Net -2850 ml   Filed Weights   08/15/20 2324 08/16/20 0619  Weight: 76.4 kg 75.5 kg    Data Reviewed: I have personally reviewed and interpreted daily labs, tele strips, imaging. I reviewed all nursing notes, pharmacy notes, vitals, pertinent old records I have discussed plan of  care as described above with RN and patient/family.  CBC: Recent Labs  Lab 08/15/20 1117  WBC 8.1  HGB 12.2*  HCT 39.1  MCV 87.9  PLT 258*   Basic Metabolic Panel: Recent Labs  Lab 08/15/20 1117 08/16/20 0145  NA 138 137  K 4.9 3.8  CL 110 103  CO2 21* 27  GLUCOSE 108* 132*  BUN 24* 25*  CREATININE 1.21 1.34*  CALCIUM 8.2* 8.6*    Studies: No results found.  Scheduled Meds: . amLODipine  10 mg Oral Daily  . aspirin EC  81 mg Oral Daily  . atorvastatin  20 mg Oral Daily  . carvedilol  3.125 mg Oral BID WC  . clopidogrel  75 mg Oral Daily  . enoxaparin (LOVENOX) injection  40 mg Subcutaneous Q24H  . hydrALAZINE  50 mg Oral Q8H  . isosorbide mononitrate  30 mg Oral Daily  . [START ON 08/17/2020] losartan  100 mg Oral Daily  . mometasone  1 application Topical BID  . pantoprazole  20 mg Oral BID  . sodium chloride flush  3 mL Intravenous Q12H  . tamsulosin  0.4 mg Oral Daily   Continuous Infusions: . sodium chloride     PRN Meds: sodium chloride, acetaminophen, guaiFENesin-dextromethorphan, hydrALAZINE, ondansetron (ZOFRAN) IV, polyvinyl alcohol, sodium chloride flush, traMADol  Time spent: 35 minutes  Author: Berle Mull, MD [facility] 08/16/2020 8:25 PM  To reach On-call, see care teams to locate the attending and reach out via [email or web address]. Between 7PM-7AM, please contact night-coverage If you still have difficulty reaching the attending provider, please page the [facility] (Director on Call) for [facility] on amion for assistance.

## 2020-08-16 NOTE — Evaluation (Signed)
Physical Therapy Evaluation Patient Details Name: LATASHA PUSKAS MRN: [id number] DOB: 1935/02/20 Today's Date: 08/16/2020   History of Present Illness  pt is an 86 y/o male presenting 5/19 with increasing SOB, PMYx:  CHF, HTN, CAD and PVD, STEMI x2, stroke x2,  Clinical Impression  Pt admitted with/for SOB.  Pt is at a min guard or better level of mobility and should be back to his baseline, slightly unsteady level of mobility quickly..  Pt currently limited functionally due to the problems listed below.  (see problems list.)  Pt will benefit from PT to maximize function and safety to be able to get home safely with available assist.    Follow Up Recommendations No PT follow up    Equipment Recommendations  None recommended by PT    Recommendations for Other Services       Precautions / Restrictions Precautions Precautions: Fall Restrictions Weight Bearing Restrictions: No      Mobility  Bed Mobility Overal bed mobility: Modified Independent                  Transfers Overall transfer level: Needs assistance   Transfers: Sit to/from Stand;Stand Pivot Transfers Sit to Stand: Supervision Stand pivot transfers: Supervision       General transfer comment: cues for safer use of UE's to stand from lower surfaces and for descent  Ambulation/Gait Ambulation/Gait assistance: Min guard Gait Distance (Feet): 130 Feet Assistive device: Rolling walker (2 wheeled) Gait Pattern/deviations: Step-through pattern Gait velocity: moderate to slower Gait velocity interpretation: 1.31 - 2.62 ft/sec, indicative of limited community ambulator General Gait Details: mildly unsteady gait, drift, mild scissor/stagger in attempts to control for minor balance issues.  moderate speed.  Stairs            Wheelchair Mobility    Modified Rankin (Stroke Patients Only)       Balance Overall balance assessment: Needs assistance Sitting-balance support: No upper extremity  supported;Single extremity supported Sitting balance-Leahy Scale: Good (to fair.) Sitting balance - Comments: able to put his socks on EOB, but does get off balance, throwing his arm (s) out to arrest falling backward.   Standing balance support: Bilateral upper extremity supported;No upper extremity supported;During functional activity Standing balance-Leahy Scale: Fair Standing balance comment: cleaned his rearend in standing with/without hand hold at times.                             Pertinent Vitals/Pain Pain Assessment: Faces Faces Pain Scale: No hurt Pain Intervention(s): Monitored during session    Home Living Family/patient expects to be discharged to:: Private residence Living Arrangements: Spouse/significant other Available Help at Discharge: Family;Available 24 hours/day Type of Home: House Home Access: Stairs to enter Entrance Stairs-Rails: Psychiatric nurse of Steps: 2-3 Home Layout: One level Home Equipment: Walker - 2 wheels;Cane - single point;Bedside commode;Shower seat;Wheelchair - Education administrator (comment) (lift chair)      Prior Function Level of Independence: Independent with assistive device(s)         Comments: Amb with cane, mows with riding mower, drives and runs errands.     Hand Dominance        Extremity/Trunk Assessment   Upper Extremity Assessment Upper Extremity Assessment: Overall WFL for tasks assessed    Lower Extremity Assessment Lower Extremity Assessment: Overall WFL for tasks assessed (mild proximal weaknesses)       Communication   Communication: HOH  Cognition Arousal/Alertness: Awake/alert Behavior During Therapy: [facility]  for tasks assessed/performed;Flat affect Overall Cognitive Status: Within Functional Limits for tasks assessed                                        General Comments General comments (skin integrity, edema, etc.): On RA during activity, SpO2 95 and HR 83 bpm     Exercises     Assessment/Plan    PT Assessment Patient needs continued PT services  PT Problem List Decreased strength;Decreased activity tolerance;Decreased balance;Decreased mobility;Cardiopulmonary status limiting activity       PT Treatment Interventions Gait training;Stair training;Functional mobility training;Therapeutic activities;Balance training;Patient/family education;DME instruction    PT Goals (Current goals can be found in the Care Plan section)  Acute Rehab PT Goals Patient Stated Goal: home when able/approp PT Goal Formulation: With patient Time For Goal Achievement: 08/23/20 Potential to Achieve Goals: Good    Frequency Min 3X/week   Barriers to discharge        Co-evaluation               AM-PAC PT "6 Clicks" Mobility  Outcome Measure Help needed turning from your back to your side while in a flat bed without using bedrails?: None Help needed moving from lying on your back to sitting on the side of a flat bed without using bedrails?: None Help needed moving to and from a bed to a chair (including a wheelchair)?: A Little Help needed standing up from a chair using your arms (e.g., wheelchair or bedside chair)?: A Little Help needed to walk in hospital room?: A Little Help needed climbing 3-5 steps with a railing? : A Little 6 Click Score: 20    End of Session   Activity Tolerance: Patient tolerated treatment well;Other (comment) (mild sob, fatigue) Patient left: in bed;with call bell/phone within reach;with family/visitor present Nurse Communication: Mobility status PT Visit Diagnosis: Unsteadiness on feet (R26.81);Other abnormalities of gait and mobility (R26.89)    Time: 6440-3474 PT Time Calculation (min) (ACUTE ONLY): 29 min   Charges:   PT Evaluation $PT Eval Moderate Complexity: 1 Mod PT Treatments $Gait Training: 8-22 mins        08/16/2020  Ginger Carne., PT Acute Rehabilitation Services [phone]  (pager) [phone]   (office)  Tessie Fass Buffi Ewton 08/16/2020, 11:03 AM

## 2020-08-16 NOTE — Progress Notes (Addendum)
Heart Failure Nurse Navigator Progress Note  Screened for HV TOC readiness. Pt states he wants to follow with [name]. Does not want to be reestablished with the [place] in [place]. As of current, pt does not trust others to care for him, feels like other provider's he has had have just been "pushing new and more pills on him".   Pt declined HV TOC clinic.   Navigator available for resources/education.  Pricilla Holm, RN, BSN Heart Failure Nurse Navigator [phone]

## 2020-08-16 NOTE — Progress Notes (Signed)
Called E.R. nurse for Report.

## 2020-08-17 LAB — BASIC METABOLIC PANEL WITH GFR
Anion gap: 10 (ref 5–15)
BUN: 28 mg/dL — ABNORMAL HIGH (ref 8–23)
CO2: 25 mmol/L (ref 22–32)
Calcium: 9.1 mg/dL (ref 8.9–10.3)
Chloride: 103 mmol/L (ref 98–111)
Creatinine, Ser: 1.55 mg/dL — ABNORMAL HIGH (ref 0.61–1.24)
GFR, Estimated: 44 mL/min — ABNORMAL LOW
Glucose, Bld: 120 mg/dL — ABNORMAL HIGH (ref 70–99)
Potassium: 4 mmol/L (ref 3.5–5.1)
Sodium: 138 mmol/L (ref 135–145)

## 2020-08-17 MED ORDER — CARVEDILOL 6.25 MG PO TABS
6.2500 mg | ORAL_TABLET | Freq: Two times a day (BID) | ORAL | Status: DC
  Administered 2020-08-17 – 2020-08-18 (×2): 6.25 mg via ORAL
  Filled 2020-08-17 (×2): qty 1

## 2020-08-17 MED ORDER — HYDRALAZINE HCL 50 MG PO TABS
100.0000 mg | ORAL_TABLET | Freq: Three times a day (TID) | ORAL | Status: DC
  Administered 2020-08-17 – 2020-08-18 (×4): 100 mg via ORAL
  Filled 2020-08-17 (×4): qty 2

## 2020-08-17 NOTE — Progress Notes (Signed)
[facility] Progress Note  Patient: [name] [name]    IOX:[id number]  DOA: 08/15/2020     Date of Service: the patient was seen and examined on 08/17/2020  Brief hospital course: [name] [name] is a 86 y.o. male with medical history significant of chronic diastolic CHF, refractory HTN, CAD status post stenting, GERD, BPH, presented with increasing shortness of breath. Found to have acute on chronic diastolic CHF.  Also appears to have subclavian stenosis causing discrepancy in blood pressure Currently plan is monitor renal function.  Assessment and Plan: Acute on chronic diastolic CHF decompensation -Secondary to uncontrolled hypertension. -Signs of fluid overload on physical exam and chest x-ray, Treated with IV diuresis.  Appears to have over diuresed. -Echocardiogram not long ago, will not repeat at this time. Due to recurrent admission with heart failure with hypertension patient will benefit from outpatient work-up with cardiology.  Hypertension emergency -Responded to Nitropaste. -Titrate up hydralazine, change metoprolol to Coreg, continue amlodipine, holding ACEI due to renal dysfunction.  Now on room air.  Blood pressure discrepancy in bilateral upper extremity. Likely subclavian stenosis Patient's blood pressure in right upper extremity is 268-341 systolic and blood pressure in the left upper extremity is 80 systolic. Patient does not have any complaint of chest pain or other signs concerning for dissection. Based on the CT scan done in November 2021 it appears that the patient has significant subclavian stenosis. Patient with also known history of PVD with carotid artery stenosis requiring endarterectomy. At present no further work-up needed. This was discussed with cardiology on-call. Outpatient follow-up with PCP.  GERD -On PPI  BPH -Continue Flomax  Acute kidney injury. Baseline serum creatinine around 1.2. Currently serum creatinine around 1.5. In  the setting of uncontrolled blood pressure poses further risk of renal dysfunction therefore recommendation would be to observe overnight. Given recent heart failure not a good candidate for IV fluids. Will liberalize p.o. fluids.  Diet: Cardiac diet DVT Prophylaxis:   enoxaparin (LOVENOX) injection 40 mg Start: 08/15/20 1800   Advance goals of care discussion: Full code  Family Communication: no family was present at bedside, at the time of interview.   Disposition:  Status is: Inpatient  Remains inpatient appropriate because:Ongoing diagnostic testing needed not appropriate for outpatient work up   Dispo: The patient is from: Home              Anticipated d/c is to: Home              Patient currently is not medically stable to d/c.   Difficult to place patient No  Subjective: No acute complaint.  Anxious to go home.  No nausea or vomiting.  Physical Exam: General: Appear in mild distress, no Rash; Oral Mucosa Clear, moist. no Abnormal Neck Mass Or lumps, Conjunctiva normal  Cardiovascular: S1 and S2 Present, no Murmur, Respiratory: good respiratory effort, Bilateral Air entry present and CTA, no Crackles, no wheezes Abdomen: Bowel Sound present, Soft and no tenderness Extremities: no Pedal edema Neurology: alert and oriented to time, place, and person affect anxious. no new focal deficit Gait not checked due to patient safety concerns    Vitals:   08/16/20 2304 08/17/20 0100 08/17/20 0311 08/17/20 1556  BP: 132/87  111/85 (!) 196/75  Pulse: 76  70 85  Resp: (!) 24  20 19   Temp: 97.7 F (36.5 C)  97.7 F (36.5 C) 98.4 F (36.9 C)  TempSrc: Oral  Oral Oral  SpO2: 92%  97% 93%  Weight:  76.1 kg    Height:        Intake/Output Summary (Last 24 hours) at 08/17/2020 1908 Last data filed at 08/16/2020 2031 Gross per 24 hour  Intake --  Output 400 ml  Net -400 ml   Filed Weights   08/15/20 2324 08/16/20 0619 08/17/20 0100  Weight: 76.4 kg 75.5 kg 76.1 kg    Data  Reviewed: I have personally reviewed and interpreted daily labs, tele strips, imaging. I reviewed all nursing notes, pharmacy notes, vitals, pertinent old records I have discussed plan of care as described above with RN and patient/family.  CBC: Recent Labs  Lab 08/15/20 1117  WBC 8.1  HGB 12.2*  HCT 39.1  MCV 87.9  PLT 846*   Basic Metabolic Panel: Recent Labs  Lab 08/15/20 1117 08/16/20 0145 08/17/20 0124  NA 138 137 138  K 4.9 3.8 4.0  CL 110 103 103  CO2 21* 27 25  GLUCOSE 108* 132* 120*  BUN 24* 25* 28*  CREATININE 1.21 1.34* 1.55*  CALCIUM 8.2* 8.6* 9.1    Studies: No results found.  Scheduled Meds: . amLODipine  10 mg Oral Daily  . aspirin EC  81 mg Oral Daily  . atorvastatin  20 mg Oral Daily  . carvedilol  6.25 mg Oral BID WC  . clopidogrel  75 mg Oral Daily  . enoxaparin (LOVENOX) injection  40 mg Subcutaneous Q24H  . hydrALAZINE  100 mg Oral Q8H  . isosorbide mononitrate  30 mg Oral Daily  . mometasone  1 application Topical BID  . pantoprazole  20 mg Oral BID  . sodium chloride flush  3 mL Intravenous Q12H  . tamsulosin  0.4 mg Oral Daily   Continuous Infusions: . sodium chloride     PRN Meds: sodium chloride, acetaminophen, guaiFENesin-dextromethorphan, hydrALAZINE, ondansetron (ZOFRAN) IV, polyvinyl alcohol, sodium chloride flush, traMADol  Time spent: 35 minutes  Author: Berle Mull, MD [facility] 08/17/2020 7:08 PM  To reach On-call, see care teams to locate the attending and reach out via [email or web address]. Between 7PM-7AM, please contact night-coverage If you still have difficulty reaching the attending provider, please page the [facility] (Director on Call) for [facility] on amion for assistance.

## 2020-08-18 ENCOUNTER — Inpatient Hospital Stay (HOSPITAL_COMMUNITY): Payer: PPO

## 2020-08-18 LAB — BASIC METABOLIC PANEL
Anion gap: 9 (ref 5–15)
BUN: 32 mg/dL — ABNORMAL HIGH (ref 8–23)
CO2: 23 mmol/L (ref 22–32)
Calcium: 8.5 mg/dL — ABNORMAL LOW (ref 8.9–10.3)
Chloride: 103 mmol/L (ref 98–111)
Creatinine, Ser: 1.71 mg/dL — ABNORMAL HIGH (ref 0.61–1.24)
GFR, Estimated: 39 mL/min — ABNORMAL LOW (ref >60)
Glucose, Bld: 109 mg/dL — ABNORMAL HIGH (ref 70–99)
Potassium: 4 mmol/L (ref 3.5–5.1)
Sodium: 135 mmol/L (ref 135–145)

## 2020-08-18 LAB — URINALYSIS, COMPLETE (UACMP) WITH MICROSCOPIC
Bilirubin Urine: NEGATIVE
Glucose, UA: NEGATIVE mg/dL
Hgb urine dipstick: NEGATIVE
Ketones, ur: NEGATIVE mg/dL
Nitrite: NEGATIVE
Protein, ur: NEGATIVE mg/dL
Specific Gravity, Urine: 1.009 (ref 1.005–1.030)
WBC, UA: >50 WBC/hpf — ABNORMAL HIGH (ref 0–5)
pH: 5 (ref 5.0–8.0)

## 2020-08-18 LAB — OSMOLALITY, URINE: Osmolality, Ur: 255 mOsm/kg — ABNORMAL LOW (ref 300–900)

## 2020-08-18 LAB — SODIUM, URINE, RANDOM: Sodium, Ur: 33 mmol/L

## 2020-08-18 LAB — CREATININE, URINE, RANDOM: Creatinine, Urine: 91.06 mg/dL

## 2020-08-18 MED ORDER — CARVEDILOL 6.25 MG PO TABS: 6.2500 mg | ORAL_TABLET | Freq: Two times a day (BID) | ORAL | 0 refills | Status: DC

## 2020-08-18 MED ORDER — ISOSORBIDE MONONITRATE ER 30 MG PO TB24: 30.0000 mg | ORAL_TABLET | Freq: Every day | ORAL | 0 refills | Status: DC

## 2020-08-18 MED ORDER — FUROSEMIDE 20 MG PO TABS: 20.0000 mg | ORAL_TABLET | Freq: Every day | ORAL | 1 refills | Status: DC | PRN

## 2020-08-18 MED ORDER — LACTATED RINGERS IV SOLN: INTRAVENOUS | Status: DC

## 2020-08-18 MED ORDER — HYDRALAZINE HCL 100 MG PO TABS: 100.0000 mg | ORAL_TABLET | Freq: Three times a day (TID) | ORAL | 0 refills | Status: DC

## 2020-08-18 NOTE — Plan of Care (Signed)
  Problem: Education: Goal: Knowledge of General Education information will improve Description Including pain rating scale, medication(s)/side effects and non-pharmacologic comfort measures Outcome: Progressing   

## 2020-08-18 NOTE — Progress Notes (Signed)
Pt discharged per MD order. Discharge instructions reviewed with pt and pt's daughter. Questions answered to satisfaction, both pt and pt's daughter verbalized understanding. IV and tele removed. Pt will be transported to personal residence in personal vehicle by his daughter.   Nissa Stannard M

## 2020-08-18 NOTE — Discharge Instructions (Signed)
Heart Failure, Self-Care Heart failure is a serious condition. The following information explains things you need to do to take care of yourself at home. To help you stay as healthy as possible, you may be asked to change your diet, take certain medicines, and make other changes in your life. Your doctor may also give you more specific instructions. If you have problems or questions, call your doctor. What are the risks? Having heart failure makes it more likely for you to have some problems. These problems can get worse if you do not take good care of yourself. Problems may include:  Damage to the kidneys, liver, or lungs.  Malnutrition.  Abnormal heart rhythms.  Blood clotting problems that could cause a stroke. Supplies needed:  Scale for weighing yourself.  Blood pressure monitor.  Notebook.  Medicines. How to care for yourself when you have heart failure Medicines Take over-the-counter and prescription medicines only as told by your doctor. Take your medicines every day.  Do not stop taking your medicine unless your doctor tells you to do so.  Do not skip any medicines.  Get your prescriptions refilled before you run out of medicine. This is important.  Talk with your doctor if you cannot afford your medicines. Eating and drinking  Eat heart-healthy foods. Talk with a diet specialist (dietitian) to create an eating plan.  Limit salt (sodium) if told by your doctor. Ask your diet specialist to tell you which seasonings are healthy for your heart.  Cook in healthy ways instead of frying. Healthy ways of cooking include roasting, grilling, broiling, baking, poaching, steaming, and stir-frying.  Choose foods that: ? Have no trans fat. ? Are low in saturated fat and cholesterol.  Choose healthy foods, such as: ? Fresh or frozen fruits and vegetables. ? Fish. ? Low-fat (lean) meats. ? Legumes, such as beans, peas, and lentils. ? Fat-free or low-fat dairy  products. ? Whole-grain foods. ? High-fiber foods.  Limit how much fluid you drink, if told by your doctor.   Alcohol use  Do not drink alcohol if: ? Your doctor tells you not to drink. ? Your heart was damaged by alcohol, or you have very bad heart failure. ? You are pregnant, may be pregnant, or are planning to become pregnant.  If you drink alcohol: ? Limit how much you have to:  0-1 drink a day for women.  0-2 drinks a day for men. ? Know how much alcohol is in your drink. In the [place] one drink equals one 12 oz bottle of beer (355 mL), one 5 oz glass of wine (148 mL), or one 1 oz glass of hard liquor (44 mL). Lifestyle  Do not smoke or use any products that contain nicotine or tobacco. If you need help quitting, ask your doctor. ? Do not use nicotine gum or patches before talking to your doctor.  Do not use illegal drugs.  Lose weight if told by your doctor.  Do physical activity if told by your doctor. Talk to your doctor before you begin an exercise if: ? You are an older adult. ? You have very bad heart failure.  Learn to manage stress. If you need help, ask your doctor.  Get physical rehab (rehabilitation) to help you stay independent and to help with your quality of life.  Participate in a cardiac rehab program. This program helps you improve your health through exercise, education, and counseling.  Plan time to rest when you get tired.   Check weight   and blood pressure  Weigh yourself every day. This will help you to know if fluid is building up in your body. ? Weigh yourself every morning after you pee (urinate) and before you eat breakfast. ? Wear the same amount of clothing each time. ? Write down your daily weight. Give your record to your doctor.  Check and write down your blood pressure as told by your doctor.  Check your pulse as told by your doctor.   Dealing with very hot and very cold weather  If it is very hot: ? Avoid activities that take a  lot of energy. ? Use air conditioning or fans, or find a cooler place. ? Avoid caffeine and alcohol. ? Wear clothing that is loose-fitting, lightweight, and light-colored.  If it is very cold: ? Avoid activities that take a lot of energy. ? Layer your clothes. ? Wear mittens or gloves, a hat, and a face covering when you go outside. ? Avoid alcohol. Follow these instructions at home:  Stay up to date with shots (vaccines). Get pneumococcal and flu (influenza) shots.  Keep all follow-up visits. Contact a doctor if:  You gain 2-3 lb (1-1.4 kg) in 24 hours or 5 lb (2.3 kg) in a week.  You have increasing shortness of breath.  You cannot do your normal activities.  You get tired easily.  You cough a lot.  You do not feel like eating or feel like you may vomit (nauseous).  You have swelling in your hands, feet, ankles, or belly (abdomen).  You cannot sleep well because it is hard to breathe.  You feel like your heart is beating fast (palpitations).  You get dizzy when you stand up.  You feel depressed or sad. Get help right away if:  You have trouble breathing.  You or someone else notices a change in your behavior, such as having trouble staying awake.  You have chest pain or discomfort.  You pass out (faint). These symptoms may be an emergency. Get help right away. Call your local emergency services (911 in the [place]  Do not wait to see if the symptoms will go away.  Do not drive yourself to the hospital. Summary  Heart failure is a serious condition. To care for yourself, you may have to change your diet, take medicines, and make other lifestyle changes.  Take your medicines every day. Do not stop taking them unless your doctor tells you to do so.  Limit salt and eat heart-healthy foods.  Ask your doctor if you can drink alcohol. You may have to stop alcohol use if you have very bad heart failure.  Contact your doctor if you gain weight quickly or feel  that your heart is beating too fast. Get help right away if you pass out or have chest pain or trouble breathing. This information is not intended to replace advice given to you by your health care provider. Make sure you discuss any questions you have with your health care provider. Document Revised: 10/07/2019 Document Reviewed: 10/07/2019 Elsevier Patient Education  2021 [employer]  

## 2020-08-19 LAB — UREA NITROGEN, URINE: Urea Nitrogen, Ur: 350 mg/dL

## 2020-08-20 ENCOUNTER — Telehealth: Payer: Self-pay | Admitting: Family Medicine

## 2020-08-20 NOTE — Telephone Encounter (Signed)
Transition Care Management Follow-up Telephone Call  Date of discharge and from where: 08/18/2020 from [facility]   How have you been since you were released from the hospital? Patient states he is doing well  Any questions or concerns? No  Items Reviewed:  Did the pt receive and understand the discharge instructions provided? Yes   Medications obtained and verified? Yes   Other? No   Any new allergies since your discharge? No   Dietary orders reviewed? Yes  Do you have support at home? Yes   Home Care and Equipment/Supplies: Were home health services ordered? no If so, what is the name of the agency? n/a  Has the agency set up a time to come to the patient's home? not applicable Were any new equipment or medical supplies ordered?  No What is the name of the medical supply agency? n/a Were you able to get the supplies/equipment? no Do you have any questions related to the use of the equipment or supplies? No  Functional Questionnaire: (I = Independent and D = Dependent) ADLs: I  Bathing/Dressing- I  Meal Prep- I  Eating- I  Maintaining continence- I  Transferring/Ambulation- I  Managing Meds- I  Follow up appointments reviewed:   PCP Hospital f/u appt confirmed? Yes  Scheduled to see Dr. Sallee Lange  on 08/21/2020 @ 3:50 PM.  [facility] f/u appt confirmed? Yes  Scheduled to see [name]  on 08/22/2020 @ 1:30 PM.  Are transportation arrangements needed? No   If their condition worsens, is the pt aware to call PCP or go to the Emergency Dept.? Yes  Was the patient provided with contact information for the PCP's office or ED? Yes  Was to pt encouraged to call back with questions or concerns? Yes

## 2020-08-20 NOTE — Telephone Encounter (Signed)
Patient has follow-up visit coming

## 2020-08-20 NOTE — Discharge Summary (Signed)
[facility] Discharge Summary   Patient: [name] [name] EYC:[id number]  PCP: Kathyrn Drown, MD  Date of admission: 08/15/2020   Date of discharge: 08/18/2020     Discharge Diagnoses:  Principal diagnosis Acute on chronic diastolic CHF with hypertensive urgency Active Problems:   Acute on chronic diastolic CHF (congestive heart failure) (HCC)   CHF (congestive heart failure) ([place]) Subclavian stenosis Acute kidney injury  Admitted From: Home Disposition:  Home   Recommendations for Outpatient Follow-up:  1. PCP: Follow-up with PCP in 1 week with renal function 2. Follow up LABS/TEST: BMP   Follow-up Information    Kathyrn Drown, MD. Schedule an appointment as soon as possible for a visit in 1 week(s).   Specialty: Family Medicine Why: get blood work-BMP in 1 week (ideally wednesday). discuss about resuming diuretic and ACE-inhibitor medicine.  Contact information: [place] [id number] [phone]        Lorretta Harp, MD. Schedule an appointment as soon as possible for a visit in 1 month(s).   Specialties: Cardiology, Radiology Contact information: [address] [place] [address] [phone]              Discharge Instructions    Diet - low sodium heart healthy   Complete by: As directed    Increase activity slowly   Complete by: As directed       Diet recommendation: Cardiac diet  Activity: The patient is advised to gradually reintroduce usual activities, as tolerated  Discharge Condition: stable  Code Status: Full code   History of present illness: As per the H and P dictated on admission, "[name] [name] is a 86 y.o. male with medical history significant of chronic diastolic CHF, refractory HTN, CAD status post stenting, GERD, BPH, presented with increasing shortness of breath.  Symptoms started about 1 month ago, gradually getting worse, initially was exertional dyspnea, but last night,  patient experienced orthopnea when he could not lie flat and sleep, denies any cough, no chest pains no fever chills.  Family also noticed increasing swelling of the ankle area.  No recent adjustment of his BP medications.  ED Course: Blood pressure significantly elevated, chest x-ray showed fluid overload.  Received 40 mg IV Lasix and symptom somewhat improved."  Hospital Course:  Summary of his active problems in the hospital is as following.   Acute on chronic diastolic CHF decompensation -Secondary to uncontrolled hypertension. -Signs of fluid overload on physical exam and chest x-ray, Treated with IV diuresis.  Appears to have over diuresed. -Echocardiogram not long ago, will not repeat at this time. Due to recurrent admission with heart failure with hypertension patient will benefit from outpatient work-up with cardiology. On discharge Lasix will be As needed only due to AKI.  Hypertension emergency For now we will continue current regimen.  Hold lisinopril.  Change Lasix to as needed.  Blood pressure discrepancy in bilateral upper extremity. Likely subclavian stenosis Patient's blood pressure in right upper extremity is [id number] systolic and blood pressure in the left upper extremity is 80 systolic. Patient does not have any complaint of chest pain or other signs concerning for dissection. Based on the CT scan done in November 2021 it appears that the patient has significant subclavian stenosis. Patient with also known history of PVD with carotid artery stenosis requiring endarterectomy. At present no further work-up needed. This was discussed with cardiology on-call. Outpatient follow-up with PCP.  GERD -On PPI  BPH -Continue Flomax  Acute kidney injury. Baseline serum creatinine around 1.2. Serum creatinine worsened to around 1.71. FENa consistent with prerenal etiology likely from overdiuresis. Ultrasound renal negative. Urine analysis not significant. Patient  does not want to stay in the hospital until renal function improves or stabilizes. He is contracting that he will hydrate himself. Patient eager to go home.  Will recommend early follow-up with PCP.  Patient was seen by physical therapy, who recommended No therapy needed on discharge,. On the day of the discharge the patient's vitals were stable, and no other new acute medical condition were reported. The patient was felt safe to be discharge at Home with no therapy needed on discharge.  Consultants: none Procedures: none  DISCHARGE MEDICATION: Allergies as of 08/18/2020      Reactions   Brilinta [ticagrelor] Shortness Of Breath, Other (See Comments)   VERTIGO, also   Influenza Vaccines Other (See Comments)   Pt states he has been hospitalized both times he was given flu vaccine as a younger adult while in the [place] and they told him not to take it again   Lipitor [atorvastatin] Other (See Comments)   JOINT PAIN- tolerates if taking CO-Q 10   Fluoride Preparations Other (See Comments)   Reaction not recalled      Medication List    STOP taking these medications   lisinopril 20 MG tablet Commonly known as: ZESTRIL   metoprolol tartrate 50 MG tablet Commonly known as: LOPRESSOR     TAKE these medications   acetaminophen 500 MG tablet Commonly known as: TYLENOL Take 500 mg by mouth every 6 (six) hours as needed for mild pain or moderate pain.   amLODipine 10 MG tablet Commonly known as: NORVASC Take 1 tablet (10 mg total) by mouth daily.   aspirin EC 81 MG tablet Take 81 mg by mouth daily.   atorvastatin 20 MG tablet Commonly known as: LIPITOR TAKE 1 TABLET(20 MG) BY MOUTH DAILY What changed: See the new instructions.   carvedilol 6.25 MG tablet Commonly known as: COREG Take 1 tablet (6.25 mg total) by mouth 2 (two) times daily with a meal.   clopidogrel 75 MG tablet Commonly known as: PLAVIX Take 1 tablet (75 mg total) by mouth daily.   furosemide 20 MG  tablet Commonly known as: Lasix Take 1 tablet (20 mg total) by mouth daily as needed (weight gain of 3Lbs in 1 day or 5lbs in 2 days.). What changed:   when to take this  reasons to take this   hydrALAZINE 100 MG tablet Commonly known as: APRESOLINE Take 1 tablet (100 mg total) by mouth 3 (three) times daily. What changed:   medication strength  See the new instructions.   isosorbide mononitrate 30 MG 24 hr tablet Commonly known as: IMDUR Take 1 tablet (30 mg total) by mouth daily.   methylcellulose 1 % ophthalmic solution Commonly known as: ARTIFICIAL TEARS Place 1 drop into both eyes as needed (for dry eyes).   mometasone 0.1 % cream Commonly known as: Elocon Apply to affected area twice daily What changed:   how much to take  how to take this  when to take this  additional instructions   nitroGLYCERIN 0.4 MG SL tablet Commonly known as: NITROSTAT Place 1 tablet (0.4 mg total) under the tongue every 5 (five) minutes as needed for chest pain.   pantoprazole 20 MG tablet Commonly known as: PROTONIX Take 1 tablet (20 mg total) by mouth 2 (two) times daily.   tamsulosin 0.4 MG  Caps capsule Commonly known as: FLOMAX TAKE 1 CAPSULE(0.4 MG) BY MOUTH DAILY What changed: See the new instructions.   traMADol 50 MG tablet Commonly known as: ULTRAM TAKE 1 TABLET BY MOUTH THREE TIMES DAILY AS NEEDED FOR MODERATE PAIN What changed: See the new instructions.       Discharge Exam: Filed Weights   08/16/20 6568 08/17/20 0100 08/18/20 0410  Weight: 75.5 kg 76.1 kg 75.4 kg   Vitals:   08/18/20 0730 08/18/20 0735  BP: (!) 175/77   Pulse: 84 84  Resp: 19   Temp: 98.7 F (37.1 C)   SpO2: 94%    General: Appear in mild distress, no Rash; Oral Mucosa Clear, moist. no Abnormal Neck Mass Or lumps, Conjunctiva normal  Cardiovascular: S1 and S2 Present, no Murmur, Respiratory: good respiratory effort, Bilateral Air entry present and CTA, no Crackles, no  wheezes Abdomen: Bowel Sound present, Soft and no tenderness Extremities: no Pedal edema Neurology: alert and oriented to time, place, and person affect appropriate. no new focal deficit Gait not checked due to patient safety concerns    The results of significant diagnostics from this hospitalization (including imaging, microbiology, ancillary and laboratory) are listed below for reference.    Significant Diagnostic Studies: US RENAL  Result Date: 08/18/2020 CLINICAL DATA:  Acute kidney injury EXAM: RENAL / URINARY TRACT ULTRASOUND COMPLETE COMPARISON:  None. FINDINGS: Right Kidney: Renal measurements: 9.3 x 4.5 x 4.7 cm = volume: 104 mL. Echogenicity within normal limits. No mass or hydronephrosis visualized. Left Kidney: Renal measurements: 9.8 x 5.4 x 6.0 cm = volume: 166 mL. Echogenicity within normal limits. No mass or hydronephrosis visualized. Bladder: Appears normal for degree of bladder distention. Other: Mildly prominent prostate. IMPRESSION: No acute findings.  No hydronephrosis. Electronically Signed   By: Rolm Baptise M.D.   On: 08/18/2020 15:20   DG Chest Portable 1 View  Result Date: 08/15/2020 CLINICAL DATA:  CHF. EXAM: PORTABLE CHEST 1 VIEW COMPARISON:  February 19, 2020. FINDINGS: Enlarged cardiac silhouette. Calcific atherosclerosis. Central pulmonary vascular congestion. Streaky bibasilar opacities. Possible small left pleural effusion. No visible pneumothorax on this limited single AP portable semi erect radiograph. Scattered calcific pleural plaque, better characterized on prior CT chest. Bilateral shoulder degenerative change. Mildly displaced right lower lateral rib fractures. IMPRESSION: 1. Cardiomegaly and pulmonary vascular congestion without overt pulmonary edema. Possible small left pleural effusion, with limited evaluation on this single AP semi-erect radiograph. 2. Streaky bibasilar opacities, favor atelectasis. Infection is not excluded. 3. Mildly displaced right  lower lateral rib fractures, possibly present on the prior radiograph but appearing more displaced on today's study. Recommend correlation with a history of trauma or tenderness. 4. Scattered calcific pleural plaque, better characterized on prior CT chest. Electronically Signed   By: Margaretha Sheffield MD   On: 08/15/2020 12:51    Microbiology: Recent Results (from the past 240 hour(s))  Resp Panel by RT-PCR (Flu A&B, Covid) Nasopharyngeal Swab     Status: None   Collection Time: 08/15/20 11:18 AM   Specimen: Nasopharyngeal Swab; Nasopharyngeal(NP) swabs in vial transport medium  Result Value Ref Range Status   SARS Coronavirus 2 by RT PCR NEGATIVE NEGATIVE Final    Comment: (NOTE) SARS-CoV-2 target nucleic acids are NOT DETECTED.  The SARS-CoV-2 RNA is generally detectable in upper respiratory specimens during the acute phase of infection. The lowest concentration of SARS-CoV-2 viral copies this assay can detect is 138 copies/mL. A negative result does not preclude SARS-Cov-2 infection and should not be  used as the sole basis for treatment or other patient management decisions. A negative result may occur with  improper specimen collection/handling, submission of specimen other than nasopharyngeal swab, presence of viral mutation(s) within the areas targeted by this assay, and inadequate number of viral copies(<138 copies/mL). A negative result must be combined with clinical observations, patient history, and epidemiological information. The expected result is Negative.  Fact Sheet for Patients:  EntrepreneurPulse.com.au  Fact Sheet for Healthcare Providers:  IncredibleEmployment.be  This test is no t yet approved or cleared by the [place] FDA and  has been authorized for detection and/or diagnosis of SARS-CoV-2 by FDA under an Emergency Use Authorization (EUA). This EUA will remain  in effect (meaning this test can be used) for the duration  of the COVID-19 declaration under Section 564(b)(1) of the Act, 21 U.S.C.section 360bbb-3(b)(1), unless the authorization is terminated  or revoked sooner.       Influenza A by PCR NEGATIVE NEGATIVE Final   Influenza B by PCR NEGATIVE NEGATIVE Final    Comment: (NOTE) The Xpert Xpress SARS-CoV-2/FLU/RSV plus assay is intended as an aid in the diagnosis of influenza from Nasopharyngeal swab specimens and should not be used as a sole basis for treatment. Nasal washings and aspirates are unacceptable for Xpert Xpress SARS-CoV-2/FLU/RSV testing.  Fact Sheet for Patients: EntrepreneurPulse.com.au  Fact Sheet for Healthcare Providers: IncredibleEmployment.be  This test is not yet approved or cleared by the [place] FDA and has been authorized for detection and/or diagnosis of SARS-CoV-2 by FDA under an Emergency Use Authorization (EUA). This EUA will remain in effect (meaning this test can be used) for the duration of the COVID-19 declaration under Section 564(b)(1) of the Act, 21 U.S.C. section 360bbb-3(b)(1), unless the authorization is terminated or revoked.  Performed at [facility], [place] [name], [place], [place] [address]      Labs: CBC: Recent Labs  Lab 08/15/20 1117  WBC 8.1  HGB 12.2*  HCT 39.1  MCV 87.9  PLT 177*   Basic Metabolic Panel: Recent Labs  Lab 08/15/20 1117 08/16/20 0145 08/17/20 0124 08/18/20 0221  NA 138 137 138 135  K 4.9 3.8 4.0 4.0  CL 110 103 103 103  CO2 21* 27 25 23   GLUCOSE 108* 132* 120* 109*  BUN 24* 25* 28* 32*  CREATININE 1.21 1.34* 1.55* 1.71*  CALCIUM 8.2* 8.6* 9.1 8.5*   Liver Function Tests: Recent Labs  Lab 08/15/20 1117  AST 17  ALT 10  ALKPHOS 49  BILITOT 0.5  PROT 5.6*  ALBUMIN 3.4*   CBG: No results for input(s): GLUCAP in the last 168 hours.  Time spent: 35 minutes  Signed:  Berle Mull  [facility] 08/18/2020 11:52 PM

## 2020-08-21 ENCOUNTER — Other Ambulatory Visit: Payer: Self-pay

## 2020-08-21 ENCOUNTER — Ambulatory Visit (INDEPENDENT_AMBULATORY_CARE_PROVIDER_SITE_OTHER): Payer: PPO | Admitting: Family Medicine

## 2020-08-21 ENCOUNTER — Encounter: Payer: Self-pay | Admitting: Family Medicine

## 2020-08-21 VITALS — BP 210/86 | HR 79 | Temp 97.7°F | Wt 174.2 lb

## 2020-08-21 DIAGNOSIS — R600 Localized edema: Secondary | ICD-10-CM

## 2020-08-21 DIAGNOSIS — I509 Heart failure, unspecified: Secondary | ICD-10-CM

## 2020-08-21 NOTE — Progress Notes (Signed)
   Subjective:    Patient ID: [name] [name], male    DOB: 1934/09/30, 86 y.o.   MRN: [id number]  HPI Pt here for follow up on hospital stay. Pt released from hospital on Sunday. Pt has 3 hydralazine doses at home 25 50 and 100; wife not sure how they got that. Pt went to hospital for CHF. Arms and legs numb and not feeling right after taking Carvedilol and Isosorbide this morning.  Patient relates he is feeling odd intermittent numbness in both arms and legs.  Denies any weakness.  States he feels somewhat funny from all the new medicine and relates the isosorbide mononitrate is giving him a headache denies any double vision denies vomiting denies unilateral numbness or weakness  Hospital notes were reviewed  Review of Systems     Objective:   Physical Exam  Lungs are clear heart regular pulse normal extremities no edema skin warm dry no neurofocal weakness noted      Assessment & Plan:  Severe elevation of blood pressure worse on the right than the left Patient skipped his hydralazine at lunchtime today I recommend that he resume the 100 mg 3 times daily Encourage patient to stick with the carvedilol Also encouraged him to stick with the isosorbide mononitrate 30 mg daily and hopefully the headache will gradually get better If he should start having vomiting blurred vision double vision unilateral numbness or weakness to immediately go to the ER for further evaluation  I am concerned that he could get back into congestive heart failure I would recommend initiating the Lasix Monday, Wednesday, Friday and utilize on the other days if significant weight gain or swelling  Patient has appointment with cardiology tomorrow I recommend a follow-up with [place] next week to recheck blood pressure  We will do lab work if creatinine is improved he may be able to restart his lisinopril  We will send a copy of this to his cardiologist Dr.Berry

## 2020-08-22 ENCOUNTER — Ambulatory Visit: Payer: PPO | Admitting: Cardiovascular Disease

## 2020-08-22 ENCOUNTER — Encounter: Payer: Self-pay | Admitting: Cardiovascular Disease

## 2020-08-22 VITALS — BP 118/72 | HR 74 | Ht 68.0 in | Wt 171.6 lb

## 2020-08-22 DIAGNOSIS — I10 Essential (primary) hypertension: Secondary | ICD-10-CM

## 2020-08-22 DIAGNOSIS — I6529 Occlusion and stenosis of unspecified carotid artery: Secondary | ICD-10-CM

## 2020-08-22 DIAGNOSIS — I6522 Occlusion and stenosis of left carotid artery: Secondary | ICD-10-CM

## 2020-08-22 DIAGNOSIS — I5032 Chronic diastolic (congestive) heart failure: Secondary | ICD-10-CM | POA: Diagnosis not present

## 2020-08-22 DIAGNOSIS — I771 Stricture of artery: Secondary | ICD-10-CM | POA: Diagnosis not present

## 2020-08-22 LAB — COMPREHENSIVE METABOLIC PANEL
ALT: 18 IU/L (ref 0–44)
AST: 21 IU/L (ref 0–40)
Albumin/Globulin Ratio: 1.6 (ref 1.2–2.2)
Albumin: 4.5 g/dL (ref 3.6–4.6)
Alkaline Phosphatase: 87 IU/L (ref 44–121)
BUN/Creatinine Ratio: 23 (ref 10–24)
BUN: 29 mg/dL — ABNORMAL HIGH (ref 8–27)
Bilirubin Total: 0.3 mg/dL (ref 0.0–1.2)
CO2: 21 mmol/L (ref 20–29)
Calcium: 9 mg/dL (ref 8.6–10.2)
Chloride: 99 mmol/L (ref 96–106)
Creatinine, Ser: 1.27 mg/dL (ref 0.76–1.27)
Globulin, Total: 2.8 g/dL (ref 1.5–4.5)
Glucose: 104 mg/dL — ABNORMAL HIGH (ref 65–99)
Potassium: 5.3 mmol/L — ABNORMAL HIGH (ref 3.5–5.2)
Sodium: 137 mmol/L (ref 134–144)
Total Protein: 7.3 g/dL (ref 6.0–8.5)
eGFR: 56 mL/min/{1.73_m2} — ABNORMAL LOW (ref >59)

## 2020-08-22 NOTE — Patient Instructions (Signed)
Medication Instructions:  Your physician recommends that you continue on your current medications as directed. Please refer to the Current Medication list given to you today.  *If you need a refill on your cardiac medications before your next appointment, please call your pharmacy*   Testing/Procedures: Your physician has requested that you have a carotid duplex. This test is an ultrasound of the carotid arteries in your neck. It looks at blood flow through these arteries that supply the brain with blood. Allow one hour for this exam. There are no restrictions or special instructions. This procedure is done at [facility]. 2nd Floor.   Follow-Up: At [facility], you and your health needs are our priority.  As part of our continuing mission to provide you with exceptional heart care, we have created designated Provider Care Teams.  These Care Teams include your primary Cardiologist (physician) and Advanced Practice Providers (APPs -  Physician Assistants and Nurse Practitioners) who all work together to provide you with the care you need, when you need it.  We recommend signing up for the patient portal called "MyChart".  Sign up information is provided on this After Visit Summary.  MyChart is used to connect with patients for Virtual Visits (Telemedicine).  Patients are able to view lab/test results, encounter notes, upcoming appointments, etc.  Non-urgent messages can be sent to your provider as well.   To learn more about what you can do with MyChart, go to [email or web address].    Your next appointment:   8 week(s)  The format for your next appointment:   In Person  Provider:   You will see one of the following Advanced Practice Providers on your designated Care Team:    Sande Rives, PA-C  Coletta Memos, FNP  Then, Quay Burow, MD will plan to see you again in 3 month(s).    Other Instructions Please keep at 30 day blood pressure log and return to review with  a PharmD for medication titration.

## 2020-08-22 NOTE — Assessment & Plan Note (Signed)
[name] [name] has at least an 80 mmHg upper extremity blood pressure differential right higher than left.  He does have a known left common carotid occlusion status post right carotid endarterectomy.  In the past his carotid Dopplers that showed symmetric blood pressures.  I suspect he is had progression of disease in his left subclavian artery although he is asymptomatic from this.  I am going to check carotid Doppler studies to further evaluate

## 2020-08-22 NOTE — Assessment & Plan Note (Signed)
History of essential hypertension with blood pressure measured today in the left arm of 118/72 and the right arm 201/75.  In the past he has had equal blood pressures and no evidence of subclavian stenosis.  He does have a left subclavian bruit.  He he is on hydralazine 100 mg p.o. 3 times daily which was just recently uptitrated by his PCP as well as high-dose amlodipine and carvedilol.  He was on lisinopril in the past but because of his renal insufficiency this no longer is an option.  I am going to have him keep a blood pressure log for 30 days and have a Pharm.D. review in the next 4 weeks.

## 2020-08-22 NOTE — Assessment & Plan Note (Signed)
[name] [name] was just seen in the ER by Dr. Tomi Bamberger for heart failure.  His BNP was approximately thousand.  He was diuresed.  His serum creatinine did elevate up to 1.7 with a baseline of 1.2.  He currently has trace lower extremity edema.  His breathing is improved.  We talked about the importance of salt avoidance.  He is weighing himself every day and treated himself with Lasix 20 mg p.o. as needed weight gain.

## 2020-08-22 NOTE — Progress Notes (Signed)
08/22/2020 [name] [name]   Mar 31, 1934  [id number]  Primary Physician Kathyrn Drown, MD Primary Cardiologist: Lorretta Harp MD FACP, [place], [place], [name]  HPI:  [name] [name] is a 86 y.o.  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 I last saw him in the office 06/17/2020. He is accompanied by his wife today... He was admitted on 07/02/13 for Anterior STEMI. Presenting symptoms included severe epigastric burning pain/ indigestion and left arm pain. Initial troponin was >20.0. He underwent emergent LHC and successful aspiration thrombectomy, PCI and stenting of proximal LAD by [name], via the right femoral artery . The overall LVEF was estimated at 50%, without wall motion abnormalities. He tolerated the procedure well and left the cath lab in stable condition. DAPT with ASA and Brilinta was initiated. He was also placed on a BB, ACE-I and statin. His post PCI course was w/o complication. He was discharged home on 07/05/13.  It should also be noted that at time of emergent LHC, he was found to also have significant peripheral vascular disease with an occluded left iliac and high-grade right common iliac artery stenosis, as well as a small AAA. The patient complained of bilateral LEE. [name] ordered for him to undergo bilateral LEAs. The study was performed in our office on 07/18/13. Findings are as follows: ABI 0.95 on Rt, 0.59 on Lt.the patient underwent angiography, diamondback orbital rotational arthrectomy, PTA and stenting of a highly calcified ostial right common iliac artery stenosis. He had excellent angiographic and clinical result. His Dopplers normalized to no longer has right lower extremity claudication. He wishes to proceed with attempt at percutaneous opacification of his left common iliac chronic total occlusion. He is not tolerating his Brilenta because of shortness of breath and we  subsequently transitioned to Plavix. He had a left brain stroke thought to be related to a watershed infarct from a hemodynamically significant right carotid stenosis in the setting of an occluded left carotid. He was evaluated by Dr. Erlinda Hong from [facility]. Dr. Erlinda Hong thought he would be a candidate for carotid stenting should he be found have a right internal carotid artery stenosis of greater than 70%.I intubated him on 05/15/14 revealing at most a 40-50% smooth right internal artery stenosis. I suspect his carotid ultrasound overestimated the degree of stenosis because the contralateral occlusion.  Since I saw him a year ago he's remained stable. He denies chest pain, shortness of breath or claudication. He does want to have a left total knee replacement. A pharmacologic Myoview stress test was intermediate risk with scar and peri-infarct ischemia. His EF is in the 45-55% range. Because of progressively worsening Doppler studies a CT scan scan was performed that showed progression of his right internal carotid artery stenosis compared to his prior CTA. He did have some right common carotid origin stenosis as well. He was neurologically asymptomatic. I did angiogram him approximately 18 months ago revealing at most 50-60% right ICA stenosis. I suspected that the Dopplers overestimated the degree of stenosis because of contralateral occlusion. Because of progression of disease he underwent angiography by myself 10/29/15 with the intent to perform right internal carotid artery stenting. Unfortunately, because of the degree of calcification and tortuosity and was never able to safely access the right common carotid artery and therefore aborted the procedure. The patient also underwent uncomplicated right carotid endarterectomy by Dr. Trula Slade 11/27/15 which he has  nicely recovered from.Recent carotid Dopplers performed 01/15/16 revealed a widely patent right carotid endarterectomy site.  He was admitted  for 2 days in November with acute on chronic diastolic heart failure and was diuresed.  He was hypertensive and had chest pain at that time but MI was ruled out.   Since I saw him 2 months ago he was seen in the ER by Dr. Tomi Bamberger for congestive heart failure.  His BNP is 1000.  He was diuresed.  He also saw [name], his PCP, in the office yesterday with elevated blood pressures.  He does have a significant blood pressure differential in his upper extremities right much higher than left.  I suspect he has a left subclavian artery stenosis Gwenlyn Found we talked about importance of salt avoidance and taking his Lasix as needed.  He denies chest pain and his shortness of breath has improved.   Current Meds  Medication Sig  . acetaminophen (TYLENOL) 500 MG tablet Take 500 mg by mouth every 6 (six) hours as needed for mild pain or moderate pain.   [name] amLODipine (NORVASC) 10 MG tablet Take 1 tablet (10 mg total) by mouth daily.  [name] aspirin EC 81 MG tablet Take 81 mg by mouth daily.  [name] atorvastatin (LIPITOR) 20 MG tablet TAKE 1 TABLET(20 MG) BY MOUTH DAILY (Patient taking differently: Take 20 mg by mouth daily.)  . carvedilol (COREG) 6.25 MG tablet Take 1 tablet (6.25 mg total) by mouth 2 (two) times daily with a meal.  . clopidogrel (PLAVIX) 75 MG tablet Take 1 tablet (75 mg total) by mouth daily.  . furosemide (LASIX) 20 MG tablet Take 1 tablet (20 mg total) by mouth daily as needed (weight gain of 3Lbs in 1 day or 5lbs in 2 days.).  [name] hydrALAZINE (APRESOLINE) 100 MG tablet Take 1 tablet (100 mg total) by mouth 3 (three) times daily.  . isosorbide mononitrate (IMDUR) 30 MG 24 hr tablet Take 1 tablet (30 mg total) by mouth daily.  . methylcellulose (ARTIFICIAL TEARS) 1 % ophthalmic solution Place 1 drop into both eyes as needed (for dry eyes).  . mometasone (ELOCON) 0.1 % cream Apply to affected area twice daily (Patient taking differently: Apply 1 application topically in the morning and at bedtime.)  .  nitroGLYCERIN (NITROSTAT) 0.4 MG SL tablet Place 1 tablet (0.4 mg total) under the tongue every 5 (five) minutes as needed for chest pain.  . pantoprazole (PROTONIX) 20 MG tablet Take 1 tablet (20 mg total) by mouth 2 (two) times daily.  . tamsulosin (FLOMAX) 0.4 MG CAPS capsule TAKE 1 CAPSULE(0.4 MG) BY MOUTH DAILY (Patient taking differently: Take 0.4 mg by mouth daily.)  . traMADol (ULTRAM) 50 MG tablet TAKE 1 TABLET BY MOUTH THREE TIMES DAILY AS NEEDED FOR MODERATE PAIN (Patient taking differently: Take 50 mg by mouth 2 (two) times daily as needed for moderate pain.)     Allergies  Allergen Reactions  . Brilinta [Ticagrelor] Shortness Of Breath and Other (See Comments)    VERTIGO, also  . Influenza Vaccines Other (See Comments)    Pt states he has been hospitalized both times he was given flu vaccine as a younger adult while in the [place] and they told him not to take it again  . Lipitor [Atorvastatin] Other (See Comments)    JOINT PAIN- tolerates if taking CO-Q 10  . Fluoride Preparations Other (See Comments)    Reaction not recalled    Social History   Socioeconomic History  .  Marital status: Married    Spouse name: Not on file  . Number of children: 3  . Years of education: ASSOCIATES  . Highest education level: Not on file  Occupational History  . Occupation: Retired  Tobacco Use  . Smoking status: Never Smoker  . Smokeless tobacco: Never Used  Substance and Sexual Activity  . Alcohol use: No    Alcohol/week: 0.0 standard drinks  . Drug use: No  . Sexual activity: Not Currently  Other Topics Concern  . Not on file  Social History Narrative   Patient is married with 3 children.   Patient is right handed.   Patient has his Associates degree.   Patient drinks 2-3 cups daily.   Social Determinants of Health   Financial Resource Strain: Not on file  Food Insecurity: Not on file  Transportation Needs: Not on file  Physical Activity: Not on file  Stress: Not on file   Social Connections: Not on file  Intimate Partner Violence: Not on file     Review of Systems: General: negative for chills, fever, night sweats or weight changes.  Cardiovascular: negative for chest pain, dyspnea on exertion, edema, orthopnea, palpitations, paroxysmal nocturnal dyspnea or shortness of breath Dermatological: negative for rash Respiratory: negative for cough or wheezing Urologic: negative for hematuria Abdominal: negative for nausea, vomiting, diarrhea, bright red blood per rectum, melena, or hematemesis Neurologic: negative for visual changes, syncope, or dizziness All other systems reviewed and are otherwise negative except as noted above.    Blood pressure 118/72, pulse 74, height 5\' 8"  (1.727 m), weight 171 lb 9.6 oz (77.8 kg).  General appearance: alert and no distress Neck: no adenopathy, no JVD, supple, symmetrical, trachea midline, thyroid not enlarged, symmetric, no tenderness/mass/nodules and Left carotid and subclavian bruit Lungs: clear to auscultation bilaterally Heart: regular rate and rhythm, S1, S2 normal, no murmur, click, rub or gallop Extremities: Trace bilateral lower extremity edema Pulses: 2+ and symmetric Skin: Skin color, texture, turgor normal. No rashes or lesions Neurologic: Alert and oriented X 3, normal strength and tone. Normal symmetric reflexes. Normal coordination and gait  EKG not performed today  ASSESSMENT AND PLAN:   Essential hypertension, benign History of essential hypertension with blood pressure measured today in the left arm of 118/72 and the right arm 201/75.  In the past he has had equal blood pressures and no evidence of subclavian stenosis.  He does have a left subclavian bruit.  He he is on hydralazine 100 mg p.o. 3 times daily which was just recently uptitrated by his PCP as well as high-dose amlodipine and carvedilol.  He was on lisinopril in the past but because of his renal insufficiency this no longer is an option.   I am going to have him keep a blood pressure log for 30 days and have a Pharm.D. review in the next 4 weeks.  CHF (congestive heart failure) [facility]) Mr. Connaughton was just seen in the ER by Dr. Tomi Bamberger for heart failure.  His BNP was approximately thousand.  He was diuresed.  His serum creatinine did elevate up to 1.7 with a baseline of 1.2.  He currently has trace lower extremity edema.  His breathing is improved.  We talked about the importance of salt avoidance.  He is weighing himself every day and treated himself with Lasix 20 mg p.o. as needed weight gain.  Stenosis of left subclavian artery [facility]) Mr. Damian has at least an 80 mmHg upper extremity blood pressure differential right higher than  left.  He does have a known left common carotid occlusion status post right carotid endarterectomy.  In the past his carotid Dopplers that showed symmetric blood pressures.  I suspect he is had progression of disease in his left subclavian artery although he is asymptomatic from this.  I am going to check carotid Doppler studies to further evaluate      Lorretta Harp MD [facility], [facility] 08/22/2020 2:02 PM

## 2020-08-27 ENCOUNTER — Other Ambulatory Visit: Payer: Self-pay

## 2020-08-27 ENCOUNTER — Ambulatory Visit (INDEPENDENT_AMBULATORY_CARE_PROVIDER_SITE_OTHER): Payer: PPO | Admitting: Family Medicine

## 2020-08-27 VITALS — BP 147/94 | Ht 68.0 in | Wt 171.0 lb

## 2020-08-27 DIAGNOSIS — I10 Essential (primary) hypertension: Secondary | ICD-10-CM | POA: Diagnosis not present

## 2020-08-27 MED ORDER — LISINOPRIL 5 MG PO TABS: 5.0000 mg | ORAL_TABLET | Freq: Every day | ORAL | 0 refills | Status: DC

## 2020-08-27 NOTE — Progress Notes (Signed)
   Subjective:    Patient ID: [name] [name], male    DOB: [birth year], 86 y.o.   MRN: [id number]  HPI  Patient arrives for a follow up on blood pressure. Patient states the cardiology put him on makes him feel bad. Patient relates that he feels bad with the medicine.  I am sympathetic to him but patient has significantly elevated blood pressure more so in the right arm than the left arm.  Also has significant arterial stenosis on the left side will be seen pharmacist with cardiology soon for adjustment of medicines Review of Systems     Objective:   Physical Exam  Lungs clear heart regular pulse normal blood pressure significantly elevated on the right side 184/94 on the left side 138/86      Assessment & Plan:  HTN On subpar control Having side effects from medications Will be seeing pharmacist with cardiology to adjust his medicines Follow-up here in 4 to 6 weeks Restart lisinopril 5 mg daily Check met 7 in 2 weeks to recheck blood pressure at that time

## 2020-08-30 ENCOUNTER — Telehealth: Payer: Self-pay

## 2020-08-30 ENCOUNTER — Other Ambulatory Visit: Payer: Self-pay | Admitting: Family Medicine

## 2020-08-30 MED ORDER — MOLNUPIRAVIR EUA 200MG CAPSULE: 4.0000 | ORAL_CAPSULE | Freq: Two times a day (BID) | ORAL | 0 refills | Status: AC

## 2020-08-30 NOTE — Telephone Encounter (Signed)
[name] [name] called back, nurse was not available.  I read Dr. Bary Leriche note to her and she is going to check with the pharmacy for the rx.  She said [name] [name] was doing really good so far and she is aware of the signs to watch for and if he gets worse over the weekend she knows to take him to urgent care or the ER.  No need for call back.

## 2020-08-30 NOTE — Telephone Encounter (Signed)
Please see other message.

## 2020-08-30 NOTE — Telephone Encounter (Signed)
In Aqib's situation several of his medications interact with Paxlovid and could increase risk of significant side effects therefore it is recommended to use  Molnupiravir 4 capsules twice daily for 5 days Most people tolerate the medicine well but a small number might have nausea with the medicine if it causes severe nausea may stop the medicine.  Watch for any warning signs regarding COVID including severe shortness of breath high fevers troubles etc. please notify us if any problems go to ER if any urgent or emergent issues medication was sent to [facility] drug in [place]

## 2020-08-30 NOTE — Telephone Encounter (Signed)
Message from [place]: Pamala Hurry called back, nurse was not available.  I read Dr. Bary Leriche note to her and she is going to check with the pharmacy for the rx.  She said [name] [name] was doing really good so far and she is aware of the signs to watch for and if he gets worse over the weekend she knows to take him to urgent care or the ER.  No need for call back.

## 2020-08-30 NOTE — Telephone Encounter (Signed)
Please advise patient is Covid positive -- Pamala Hurry calling because now Ed has tested positive for covid this morning.  She said so far he seems to be ok, slight fever.  She wants to know if Dr. Nicki Reaper will call in the same medicine he called in for her.  She said she thinks [facility] was the only place that had it.

## 2020-08-30 NOTE — Telephone Encounter (Signed)
Pamala Hurry calling because now [name] [name] has tested positive for covid this morning.  She said so far he seems to be ok, slight fever.  She wants to know if Dr. Nicki Reaper will call in the same medicine he called in for her.  She said she thinks [facility] was the only place that had it.

## 2020-09-16 ENCOUNTER — Encounter (HOSPITAL_COMMUNITY): Payer: Self-pay | Admitting: Emergency Medicine

## 2020-09-16 ENCOUNTER — Observation Stay (HOSPITAL_COMMUNITY)
Admission: EM | Admit: 2020-09-16 | Discharge: 2020-09-17 | Disposition: A | Discharge status: Home / Self Care | Payer: PPO | Attending: Internal Medicine | Admitting: Internal Medicine

## 2020-09-16 ENCOUNTER — Ambulatory Visit (INDEPENDENT_AMBULATORY_CARE_PROVIDER_SITE_OTHER): Payer: PPO | Admitting: Family Medicine

## 2020-09-16 ENCOUNTER — Other Ambulatory Visit: Payer: Self-pay

## 2020-09-16 ENCOUNTER — Encounter: Payer: Self-pay | Admitting: Family Medicine

## 2020-09-16 VITALS — BP 100/71 | HR 67 | Temp 97.2°F | Ht 68.0 in | Wt 169.0 lb

## 2020-09-16 DIAGNOSIS — I5033 Acute on chronic diastolic (congestive) heart failure: Secondary | ICD-10-CM | POA: Diagnosis not present

## 2020-09-16 DIAGNOSIS — E782 Mixed hyperlipidemia: Secondary | ICD-10-CM

## 2020-09-16 DIAGNOSIS — Z7982 Long term (current) use of aspirin: Secondary | ICD-10-CM | POA: Diagnosis not present

## 2020-09-16 DIAGNOSIS — D62 Acute posthemorrhagic anemia: Secondary | ICD-10-CM | POA: Diagnosis not present

## 2020-09-16 DIAGNOSIS — D649 Anemia, unspecified: Secondary | ICD-10-CM | POA: Insufficient documentation

## 2020-09-16 DIAGNOSIS — I2510 Atherosclerotic heart disease of native coronary artery without angina pectoris: Secondary | ICD-10-CM | POA: Diagnosis not present

## 2020-09-16 DIAGNOSIS — K922 Gastrointestinal hemorrhage, unspecified: Secondary | ICD-10-CM | POA: Diagnosis not present

## 2020-09-16 DIAGNOSIS — I10 Essential (primary) hypertension: Secondary | ICD-10-CM

## 2020-09-16 DIAGNOSIS — Z79899 Other long term (current) drug therapy: Secondary | ICD-10-CM | POA: Diagnosis not present

## 2020-09-16 DIAGNOSIS — K5790 Diverticulosis of intestine, part unspecified, without perforation or abscess without bleeding: Secondary | ICD-10-CM

## 2020-09-16 DIAGNOSIS — I5042 Chronic combined systolic (congestive) and diastolic (congestive) heart failure: Secondary | ICD-10-CM

## 2020-09-16 DIAGNOSIS — Z9861 Coronary angioplasty status: Secondary | ICD-10-CM | POA: Diagnosis not present

## 2020-09-16 DIAGNOSIS — K921 Melena: Secondary | ICD-10-CM | POA: Diagnosis not present

## 2020-09-16 DIAGNOSIS — I110 Hypertensive heart disease with heart failure: Secondary | ICD-10-CM | POA: Insufficient documentation

## 2020-09-16 DIAGNOSIS — I5032 Chronic diastolic (congestive) heart failure: Secondary | ICD-10-CM | POA: Diagnosis not present

## 2020-09-16 DIAGNOSIS — D696 Thrombocytopenia, unspecified: Secondary | ICD-10-CM | POA: Diagnosis present

## 2020-09-16 DIAGNOSIS — I739 Peripheral vascular disease, unspecified: Secondary | ICD-10-CM | POA: Diagnosis present

## 2020-09-16 DIAGNOSIS — K625 Hemorrhage of anus and rectum: Secondary | ICD-10-CM | POA: Diagnosis not present

## 2020-09-16 DIAGNOSIS — U071 COVID-19: Secondary | ICD-10-CM | POA: Insufficient documentation

## 2020-09-16 DIAGNOSIS — I639 Cerebral infarction, unspecified: Secondary | ICD-10-CM | POA: Diagnosis present

## 2020-09-16 DIAGNOSIS — E785 Hyperlipidemia, unspecified: Secondary | ICD-10-CM | POA: Diagnosis present

## 2020-09-16 LAB — TYPE AND SCREEN
ABO/RH(D): A POS
Antibody Screen: NEGATIVE

## 2020-09-16 LAB — POCT HEMOGLOBIN: Hemoglobin: 8.9 g/dL — AB (ref 11–14.6)

## 2020-09-16 LAB — BASIC METABOLIC PANEL
Anion gap: 7 (ref 5–15)
BUN: 29 mg/dL — ABNORMAL HIGH (ref 8–23)
CO2: 22 mmol/L (ref 22–32)
Calcium: 8.3 mg/dL — ABNORMAL LOW (ref 8.9–10.3)
Chloride: 106 mmol/L (ref 98–111)
Creatinine, Ser: 1.08 mg/dL (ref 0.61–1.24)
GFR, Estimated: >60 mL/min (ref >60)
Glucose, Bld: 121 mg/dL — ABNORMAL HIGH (ref 70–99)
Potassium: 5.8 mmol/L — ABNORMAL HIGH (ref 3.5–5.1)
Sodium: 135 mmol/L (ref 135–145)

## 2020-09-16 LAB — COMPREHENSIVE METABOLIC PANEL
ALT: 13 U/L (ref 0–44)
AST: 13 U/L — ABNORMAL LOW (ref 15–41)
Albumin: 3.6 g/dL (ref 3.5–5.0)
Alkaline Phosphatase: 63 U/L (ref 38–126)
Anion gap: 7 (ref 5–15)
BUN: 29 mg/dL — ABNORMAL HIGH (ref 8–23)
CO2: 22 mmol/L (ref 22–32)
Calcium: 8.2 mg/dL — ABNORMAL LOW (ref 8.9–10.3)
Chloride: 105 mmol/L (ref 98–111)
Creatinine, Ser: 1.12 mg/dL (ref 0.61–1.24)
GFR, Estimated: >60 mL/min (ref >60)
Glucose, Bld: 122 mg/dL — ABNORMAL HIGH (ref 70–99)
Potassium: 5.8 mmol/L — ABNORMAL HIGH (ref 3.5–5.1)
Sodium: 134 mmol/L — ABNORMAL LOW (ref 135–145)
Total Bilirubin: 0.6 mg/dL (ref 0.3–1.2)
Total Protein: 5.9 g/dL — ABNORMAL LOW (ref 6.5–8.1)

## 2020-09-16 LAB — RESP PANEL BY RT-PCR (FLU A&B, COVID) ARPGX2
Influenza A by PCR: NEGATIVE
Influenza B by PCR: NEGATIVE
SARS Coronavirus 2 by RT PCR: POSITIVE — AB

## 2020-09-16 LAB — CBC WITH DIFFERENTIAL/PLATELET
Abs Immature Granulocytes: 0.03 10*3/uL (ref 0.00–0.07)
Basophils Absolute: 0 10*3/uL (ref 0.0–0.1)
Basophils Relative: 0 %
Eosinophils Absolute: 0.2 10*3/uL (ref 0.0–0.5)
Eosinophils Relative: 2 %
HCT: 32.7 % — ABNORMAL LOW (ref 39.0–52.0)
Hemoglobin: 10.3 g/dL — ABNORMAL LOW (ref 13.0–17.0)
Immature Granulocytes: 0 %
Lymphocytes Relative: 8 %
Lymphs Abs: 0.6 10*3/uL — ABNORMAL LOW (ref 0.7–4.0)
MCH: 27.5 pg (ref 26.0–34.0)
MCHC: 31.5 g/dL (ref 30.0–36.0)
MCV: 87.2 fL (ref 80.0–100.0)
Monocytes Absolute: 0.4 10*3/uL (ref 0.1–1.0)
Monocytes Relative: 5 %
Neutro Abs: 7 10*3/uL (ref 1.7–7.7)
Neutrophils Relative %: 85 %
Platelets: 119 10*3/uL — ABNORMAL LOW (ref 150–400)
RBC: 3.75 MIL/uL — ABNORMAL LOW (ref 4.22–5.81)
RDW: 15.1 % (ref 11.5–15.5)
WBC: 8.3 10*3/uL (ref 4.0–10.5)
nRBC: 0 % (ref 0.0–0.2)

## 2020-09-16 LAB — HEMOGLOBIN AND HEMATOCRIT, BLOOD
HCT: 29.3 % — ABNORMAL LOW (ref 39.0–52.0)
Hemoglobin: 9.1 g/dL — ABNORMAL LOW (ref 13.0–17.0)

## 2020-09-16 LAB — PROTIME-INR
INR: 1.1 (ref 0.8–1.2)
Prothrombin Time: 14.1 seconds (ref 11.4–15.2)

## 2020-09-16 MED ORDER — ISOSORBIDE MONONITRATE ER 60 MG PO TB24
30.0000 mg | ORAL_TABLET | Freq: Every day | ORAL | Status: DC
  Administered 2020-09-17: 30 mg via ORAL
  Filled 2020-09-16: qty 1

## 2020-09-16 MED ORDER — TRAMADOL HCL 50 MG PO TABS
50.0000 mg | ORAL_TABLET | Freq: Two times a day (BID) | ORAL | Status: DC | PRN
Start: 2020-09-16 — End: 2020-09-17

## 2020-09-16 MED ORDER — LISINOPRIL 5 MG PO TABS
5.0000 mg | ORAL_TABLET | Freq: Every day | ORAL | Status: DC
  Administered 2020-09-17: 5 mg via ORAL
  Filled 2020-09-16: qty 1

## 2020-09-16 MED ORDER — ATORVASTATIN CALCIUM 20 MG PO TABS
20.0000 mg | ORAL_TABLET | Freq: Every day | ORAL | Status: DC
  Filled 2020-09-16: qty 1

## 2020-09-16 MED ORDER — SODIUM ZIRCONIUM CYCLOSILICATE 5 G PO PACK
5.0000 g | PACK | ORAL | Status: AC
  Administered 2020-09-16: 5 g via ORAL
  Filled 2020-09-16: qty 1

## 2020-09-16 MED ORDER — ACETAMINOPHEN 325 MG PO TABS: 650.0000 mg | ORAL_TABLET | Freq: Four times a day (QID) | ORAL | Status: DC | PRN

## 2020-09-16 MED ORDER — PANTOPRAZOLE SODIUM 40 MG IV SOLR
40.0000 mg | INTRAVENOUS | Status: DC
  Administered 2020-09-17: 40 mg via INTRAVENOUS
  Filled 2020-09-16: qty 40

## 2020-09-16 MED ORDER — TAMSULOSIN HCL 0.4 MG PO CAPS
0.4000 mg | ORAL_CAPSULE | Freq: Every day | ORAL | Status: DC
  Administered 2020-09-17: 0.4 mg via ORAL
  Filled 2020-09-16: qty 1

## 2020-09-16 MED ORDER — PANTOPRAZOLE SODIUM 40 MG IV SOLR
40.0000 mg | INTRAVENOUS | Status: AC
  Administered 2020-09-16: 40 mg via INTRAVENOUS
  Filled 2020-09-16: qty 40

## 2020-09-16 MED ORDER — HYDRALAZINE HCL 25 MG PO TABS
100.0000 mg | ORAL_TABLET | Freq: Three times a day (TID) | ORAL | Status: DC
  Administered 2020-09-16 – 2020-09-17 (×2): 100 mg via ORAL
  Filled 2020-09-16 (×2): qty 4

## 2020-09-16 MED ORDER — CARVEDILOL 3.125 MG PO TABS
6.2500 mg | ORAL_TABLET | Freq: Two times a day (BID) | ORAL | Status: DC
  Administered 2020-09-17: 6.25 mg via ORAL
  Filled 2020-09-16: qty 2

## 2020-09-16 MED ORDER — ACETAMINOPHEN 650 MG RE SUPP: 650.0000 mg | Freq: Four times a day (QID) | RECTAL | Status: DC | PRN

## 2020-09-16 MED ORDER — AMLODIPINE BESYLATE 5 MG PO TABS
10.0000 mg | ORAL_TABLET | Freq: Every day | ORAL | Status: DC
  Administered 2020-09-17: 10 mg via ORAL
  Filled 2020-09-16: qty 2

## 2020-09-16 MED ORDER — FUROSEMIDE 20 MG PO TABS: 20.0000 mg | ORAL_TABLET | Freq: Every day | ORAL | Status: DC | PRN

## 2020-09-16 NOTE — ED Notes (Signed)
Date and time results received: 09/16/20 1930 (use smartphrase ".now" to insert current time)  Test: covid Critical Value: positive  Name of Provider Notified: Dr Marily Memos  Orders Received? Or Actions Taken?: Actions Taken: no orders received

## 2020-09-16 NOTE — Progress Notes (Signed)
   Subjective:    Patient ID: [name] [name], male    DOB: [birth year], 86 y.o.   MRN: [id number]  HPIfollow up on restarting lisinopril.   Noticed rectal bleeding this morning. Pt thinks it is from taking aleve one bid.  The patient states that he been taking Aleve recently Relates rectal bleeding 3 times last night Denies any severe abdominal pain States feels weak.   Review of Systems     Objective:   Physical Exam  Lungs are clear heart rate controlled abdomen is soft rectal exam frank blood      Assessment & Plan:   Hematochezia Hemoglobin low at 8.9 This is a significant drop compared to previous Will need consultation emergently with GI In the past has been seen in [facility] elects to go to [facility] for further evaluation Family will transport

## 2020-09-16 NOTE — ED Notes (Signed)
GI np at bedside

## 2020-09-16 NOTE — ED Triage Notes (Signed)
Pt c/o rectal bleeding since last night.
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
## 2020-09-17 DIAGNOSIS — I6350 Cerebral infarction due to unspecified occlusion or stenosis of unspecified cerebral artery: Secondary | ICD-10-CM

## 2020-09-17 DIAGNOSIS — I5032 Chronic diastolic (congestive) heart failure: Secondary | ICD-10-CM | POA: Diagnosis not present

## 2020-09-17 DIAGNOSIS — D62 Acute posthemorrhagic anemia: Secondary | ICD-10-CM | POA: Diagnosis not present

## 2020-09-17 DIAGNOSIS — I739 Peripheral vascular disease, unspecified: Secondary | ICD-10-CM | POA: Diagnosis not present

## 2020-09-17 DIAGNOSIS — K922 Gastrointestinal hemorrhage, unspecified: Secondary | ICD-10-CM | POA: Diagnosis not present

## 2020-09-17 DIAGNOSIS — K5790 Diverticulosis of intestine, part unspecified, without perforation or abscess without bleeding: Secondary | ICD-10-CM | POA: Diagnosis not present

## 2020-09-17 DIAGNOSIS — K625 Hemorrhage of anus and rectum: Secondary | ICD-10-CM | POA: Diagnosis not present

## 2020-09-17 DIAGNOSIS — D649 Anemia, unspecified: Secondary | ICD-10-CM | POA: Diagnosis not present

## 2020-09-17 DIAGNOSIS — D696 Thrombocytopenia, unspecified: Secondary | ICD-10-CM

## 2020-09-17 DIAGNOSIS — I10 Essential (primary) hypertension: Secondary | ICD-10-CM

## 2020-09-17 DIAGNOSIS — E782 Mixed hyperlipidemia: Secondary | ICD-10-CM | POA: Diagnosis not present

## 2020-09-17 DIAGNOSIS — Z9861 Coronary angioplasty status: Secondary | ICD-10-CM | POA: Diagnosis not present

## 2020-09-17 DIAGNOSIS — I2510 Atherosclerotic heart disease of native coronary artery without angina pectoris: Secondary | ICD-10-CM | POA: Diagnosis not present

## 2020-09-17 LAB — COMPREHENSIVE METABOLIC PANEL
ALT: 11 U/L (ref 0–44)
AST: 12 U/L — ABNORMAL LOW (ref 15–41)
Albumin: 3.2 g/dL — ABNORMAL LOW (ref 3.5–5.0)
Alkaline Phosphatase: 56 U/L (ref 38–126)
Anion gap: 6 (ref 5–15)
BUN: 24 mg/dL — ABNORMAL HIGH (ref 8–23)
CO2: 24 mmol/L (ref 22–32)
Calcium: 8.4 mg/dL — ABNORMAL LOW (ref 8.9–10.3)
Chloride: 109 mmol/L (ref 98–111)
Creatinine, Ser: 1 mg/dL (ref 0.61–1.24)
GFR, Estimated: >60 mL/min (ref >60)
Glucose, Bld: 91 mg/dL (ref 70–99)
Potassium: 4.2 mmol/L (ref 3.5–5.1)
Sodium: 139 mmol/L (ref 135–145)
Total Bilirubin: 0.6 mg/dL (ref 0.3–1.2)
Total Protein: 5.3 g/dL — ABNORMAL LOW (ref 6.5–8.1)

## 2020-09-17 LAB — HEMOGLOBIN AND HEMATOCRIT, BLOOD
HCT: 27.8 % — ABNORMAL LOW (ref 39.0–52.0)
HCT: 29.1 % — ABNORMAL LOW (ref 39.0–52.0)
HCT: 33.6 % — ABNORMAL LOW (ref 39.0–52.0)
Hemoglobin: 10.3 g/dL — ABNORMAL LOW (ref 13.0–17.0)
Hemoglobin: 8.8 g/dL — ABNORMAL LOW (ref 13.0–17.0)
Hemoglobin: 9 g/dL — ABNORMAL LOW (ref 13.0–17.0)

## 2020-09-17 MED ORDER — PANTOPRAZOLE SODIUM 40 MG PO TBEC: 40.0000 mg | DELAYED_RELEASE_TABLET | Freq: Two times a day (BID) | ORAL | 1 refills | Status: DC

## 2020-09-17 NOTE — Progress Notes (Signed)
Subjective: Doing well today.  No BRBPR in the last 24 hours.  No melena.  Denies abdominal pain, nausea, vomiting.  Would like to eat.  Again states he is not interested in any sort of endoscopic evaluation unless absolutely necessary.  He does not want a follow-up with GI outpatient.  States he wants to follow-up with [name] and go from there.   Objective: Vital signs in last 24 hours: Temp:  [97.2 F (36.2 C)-98.5 F (36.9 C)] 97.4 F (36.3 C) (06/21 0502) Pulse Rate:  [59-80] 80 (06/21 0850) Resp:  [15-24] 18 (06/21 0502) BP: (100-210)/(71-99) 117/81 (06/21 0850) SpO2:  [95 %-100 %] 97 % (06/21 0850) Weight:  [73 kg-76.7 kg] 73 kg (06/20 1254) Last BM Date: 09/16/20 General:   Alert and oriented, pleasant Head:  Normocephalic and atraumatic. Eyes:  No icterus, sclera clear. Conjuctiva pink.  Abdomen:  Bowel sounds present, soft, non-tender, non-distended. No HSM or hernias noted. No rebound or guarding. No masses appreciated  Msk:  Symmetrical without gross deformities. Normal posture. Extremities:  Without edema. Neurologic:  Alert and  oriented x4;  grossly normal neurologically. Skin:  Warm and dry, intact without significant lesions.  Psych:  Normal mood and affect.  Intake/Output from previous day: No intake/output data recorded. Intake/Output this shift: No intake/output data recorded.  Lab Results: Recent Labs    09/16/20 1408 09/16/20 1913 09/17/20 0017 09/17/20 0434 09/17/20 0800  WBC 8.3  --   --   --   --   HGB 10.3*   < > 8.8* 9.0* 10.3*  HCT 32.7*   < > 27.8* 29.1* 33.6*  PLT 119*  --   --   --   --    < > = values in this interval not displayed.   BMET Recent Labs    09/16/20 1408 09/16/20 1453 09/17/20 0434  NA 134* 135 139  K 5.8* 5.8* 4.2  CL 105 106 109  CO2 22 22 24   GLUCOSE 122* 121* 91  BUN 29* 29* 24*  CREATININE 1.12 1.08 1.00  CALCIUM 8.2* 8.3* 8.4*   LFT Recent Labs    09/16/20 1408 09/17/20 0434  PROT 5.9* 5.3*   ALBUMIN 3.6 3.2*  AST 13* 12*  ALT 13 11  ALKPHOS 63 56  BILITOT 0.6 0.6   PT/INR Recent Labs    09/16/20 1408  LABPROT 14.1  INR 1.1     Assessment: 86 year old male who presented to the emergency room from PCP office after presenting for evaluation of new onset painless hematochezia and found to have hemoglobin 8.9 as outpatient, down from hemoglobin of 12.2 in May 2022.  Hemoglobin 10.3 on admission.  Clinical presentation suspicious for diverticular bleed in light of NSAID use (Aleve twice daily for several weeks) and Plavix.  Known severe sigmoid diverticulosis on last colonoscopy in 2016.  Unable to rule out other etiology including colonic polyps, hemorrhoids, AVMs, malignancy.  Encouragingly, rectal bleeding has stopped.  Hemoglobin declined to 8.9 on 6/21, improved to 10.3 today.  Offered colonoscopy for further evaluation yesterday, but patient prefers not to have colonoscopy unless absolutely necessary.  He confirmed this again today.  Also does not want to follow-up outpatient with GI ([facility]).  Prefers to see PCP for follow-up and go from there.  Plan: As rectal bleeding has resolved and hemoglobin is improved, anticipate discharge in the next 24 hours. No colonoscopy inpatient per patient's request.  Continue to monitor H&H and for overt GI bleeding.  Recommended outpatient follow-up with primary GI ([facility]) for consideration of colonoscopy. Patient declining. States he will follow-up with PCP first and go from there. Recommended returning to the emergency room if any recurrent rectal bleeding. Avoid all NSAID products. Continue PPI Advance to heart healthy diet.      LOS: 1 day    09/17/2020, 11:02 AM   Aliene Altes, PA-C [facility]

## 2020-09-17 NOTE — Care Management CC44 (Signed)
Condition Code 44 Documentation Completed  Patient Details  Name: [name] [name] MRN: [id number] Date of Birth: 22-Feb-1935   Condition Code 44 given:  Yes Patient signature on Condition Code 44 notice:  Yes Documentation of 2 MD's agreement:  Yes Code 44 added to claim:  Yes    Ihor Gully, LCSW 09/17/2020, 1:18 PM

## 2020-09-17 NOTE — Care Management Obs Status (Signed)
[place] NOTIFICATION   Patient Details  Name: [name] [name] MRN: [id number] Date of Birth: 1934-10-17   Medicare Observation Status Notification Given:  Yes    Ihor Gully, LCSW 09/17/2020, 1:18 PM

## 2020-09-17 NOTE — Discharge Summary (Signed)
Physician Discharge Summary  [name] [name] SEG:[id number] DOB: May 22, 1934 DOA: 09/16/2020  PCP: Kathyrn Drown, MD  Admit date: 09/16/2020 Discharge date: 09/17/2020  Time spent: 35 minutes  Recommendations for Outpatient Follow-up:  Repeat CBC to follow hemoglobin trend Reassess blood pressure and adjust antihypertensive regimen as needed.  Discharge Diagnoses:  Active Problems:   Hyperlipidemia   Hypertension   Peripheral vascular disease (HCC)   CAD S/P percutaneous coronary angioplasty   Cerebral infarction (HCC)   Thrombocytopenia (HCC)   Chronic diastolic CHF (congestive heart failure) ([place])   GI bleed   Diverticulosis   Acute blood loss anemia   Discharge Condition: Stable and improved.  Discharged home with instructions to follow-up with PCP in 10 days.  CODE STATUS: Full code.  Diet recommendation: Heart healthy diet.  Filed Weights   09/16/20 1254  Weight: 73 kg    History of present illness:  As per H&P written by Dr. Marily Memos on 09/16/2020 [name] [name] is a 86 y.o. male with medical history significant of Thrombocytopenia, HTN, CAD/MI s/p DES 2002, GERD, Diverticulosis, HLD, PVD w/ Stent in L common iliac, and CVA w/ residual R side weakness, and Diastolic CHF.    PT was in his nml state of health until approximately 20:00 on 09/15/20 when he felt a sudden urge to have a BM. BM was described as bloody diarrhea. Pt endorsed another similar BM later that evening and then again at 04:30 on 09/16/20. 3 Total bloody BMs. Each associated w/ acute crampy lower abd sensation. Denies other abd discomfort, n/v, HA, fatigue, weakness, fever, bloating. States for the past week he's been taking 1-2 Aleve a day which is not his usual. This has been due MSK pain that developed after contracting COVID last week.   Of note pt DC from [facility] on 08/18/20 after admission for Acute on Chronic diastolic CHF exacerbation and HTN urgency.      ED Course: objective findings below.  GI consulted. Pt hemodynamically stable   Hospital Course:  Lower GI bleed/acute blood loss anemia -Baseline hemoglobin around 12; hemoglobin check at PCP office around 8.9. -Patient presentation for description for diverticulosis -Self-limited episode; no further bleeding reported in the last 24 hours since admission. -No nausea, no vomiting, no abdominal pain. -Case discussed with gastroenterology service and at this moment decision has been made for outpatient follow-up. -Hemoglobin at discharge 10.3 -Advised to stop the use of NSAIDs -Safe to resume aspirin and Plavix. -Repeat CBC to follow hemoglobin trend at follow-up visit.  Hypertension -Stable and well-controlled -Continue home antihypertensive agents including hydralazine, Norvasc, Imdur and lisinopril. -Heart healthy diet has been encouraged.  History of coronary artery disease -Status post stent placement -Continue aspirin, Plavix, heart healthy diet, Imdur, statin, hydralazine and lisinopril -Outpatient follow-up with cardiology service recommended.  Gastroesophageal flux disease -Continue PPI  Hyperlipidemia -Continue statin. -Heart healthy diet encouraged.  History of CVA -Continue aspirin and Plavix for secondary prevention -No new focal deficits or complaints.   Procedures: See below for x-ray reports.  Consultations: GI service.  Discharge Exam: Vitals:   09/17/20 0502 09/17/20 0850  BP: 113/76 117/81  Pulse: 71 80  Resp: 18   Temp: (!) 97.4 F (36.3 C)   SpO2: 100% 97%    General: Afebrile, no chest pain, no nausea, no vomiting, no abdominal pain.  No bleeding reported in the last 24 hours.  Patient denies melena. Cardiovascular: S1 and S2, no rubs, no gallops, no JVD.   Respiratory: Clear to auscultation  bilaterally; no using accessory muscles. Abdomen: Soft, nontender, positive bowel sounds Extremities: No cyanosis or clubbing.  Discharge Instructions   Discharge Instructions      Discharge instructions   Complete by: As directed    Take medications as prescribed  Maintain adequate hydration -Avoid the use of NSAID's Follow heart healthy diet Check your weight on daily basis.      Allergies as of 09/17/2020       Reactions   Brilinta [ticagrelor] Shortness Of Breath, Other (See Comments)   VERTIGO, also   Influenza Vaccines Other (See Comments)   Pt states he has been hospitalized both times he was given flu vaccine as a younger adult while in the [place] and they told him not to take it again   Lipitor [atorvastatin] Other (See Comments)   JOINT PAIN- tolerates if taking CO-Q 10   Fluoride Preparations Other (See Comments)   Reaction not recalled        Medication List     STOP taking these medications    mometasone 0.1 % cream Commonly known as: Elocon       TAKE these medications    acetaminophen 500 MG tablet Commonly known as: TYLENOL Take 500 mg by mouth every 6 (six) hours as needed for mild pain or moderate pain.   amLODipine 10 MG tablet Commonly known as: NORVASC Take 1 tablet (10 mg total) by mouth daily.   aspirin EC 81 MG tablet Take 81 mg by mouth daily.   atorvastatin 20 MG tablet Commonly known as: LIPITOR TAKE 1 TABLET(20 MG) BY MOUTH DAILY What changed: See the new instructions.   carvedilol 6.25 MG tablet Commonly known as: COREG Take 1 tablet (6.25 mg total) by mouth 2 (two) times daily with a meal.   clopidogrel 75 MG tablet Commonly known as: PLAVIX Take 1 tablet (75 mg total) by mouth daily.   furosemide 20 MG tablet Commonly known as: Lasix Take 1 tablet (20 mg total) by mouth daily as needed (weight gain of 3Lbs in 1 day or 5lbs in 2 days.).   hydrALAZINE 100 MG tablet Commonly known as: APRESOLINE Take 1 tablet (100 mg total) by mouth 3 (three) times daily.   isosorbide mononitrate 30 MG 24 hr tablet Commonly known as: IMDUR Take 1 tablet (30 mg total) by mouth daily.   lisinopril 5 MG  tablet Commonly known as: ZESTRIL Take 1 tablet (5 mg total) by mouth daily.   methylcellulose 1 % ophthalmic solution Commonly known as: ARTIFICIAL TEARS Place 1 drop into both eyes as needed (for dry eyes).   nitroGLYCERIN 0.4 MG SL tablet Commonly known as: NITROSTAT Place 1 tablet (0.4 mg total) under the tongue every 5 (five) minutes as needed for chest pain.   pantoprazole 40 MG tablet Commonly known as: PROTONIX Take 1 tablet (40 mg total) by mouth 2 (two) times daily. What changed:  medication strength how much to take   tamsulosin 0.4 MG Caps capsule Commonly known as: FLOMAX TAKE 1 CAPSULE(0.4 MG) BY MOUTH DAILY What changed: See the new instructions.   traMADol 50 MG tablet Commonly known as: ULTRAM TAKE 1 TABLET BY MOUTH THREE TIMES DAILY AS NEEDED FOR MODERATE PAIN What changed: See the new instructions.       Allergies  Allergen Reactions   Brilinta [Ticagrelor] Shortness Of Breath and Other (See Comments)    VERTIGO, also   Influenza Vaccines Other (See Comments)    Pt states he has been hospitalized both times  he was given flu vaccine as a younger adult while in the [place] and they told him not to take it again   Lipitor [Atorvastatin] Other (See Comments)    JOINT PAIN- tolerates if taking CO-Q 10   Fluoride Preparations Other (See Comments)    Reaction not recalled    Follow-up Information     Luking, Elayne Snare, MD. Schedule an appointment as soon as possible for a visit.   Specialty: Family Medicine Contact information: [place] [address] [phone]         Lorretta Harp, MD .   Specialties: Cardiology, Radiology Contact information: [address] [place] [address] [phone]                 The results of significant diagnostics from this hospitalization (including imaging, microbiology, ancillary and laboratory) are listed below for reference.    Significant Diagnostic  Studies: US RENAL  Result Date: 08/18/2020 CLINICAL DATA:  Acute kidney injury EXAM: RENAL / URINARY TRACT ULTRASOUND COMPLETE COMPARISON:  None. FINDINGS: Right Kidney: Renal measurements: 9.3 x 4.5 x 4.7 cm = volume: 104 mL. Echogenicity within normal limits. No mass or hydronephrosis visualized. Left Kidney: Renal measurements: 9.8 x 5.4 x 6.0 cm = volume: 166 mL. Echogenicity within normal limits. No mass or hydronephrosis visualized. Bladder: Appears normal for degree of bladder distention. Other: Mildly prominent prostate. IMPRESSION: No acute findings.  No hydronephrosis. Electronically Signed   By: Rolm Baptise M.D.   On: 08/18/2020 15:20    Microbiology: Recent Results (from the past 240 hour(s))  Resp Panel by RT-PCR (Flu A&B, Covid) Nasopharyngeal Swab     Status: Abnormal   Collection Time: 09/16/20  4:50 PM   Specimen: Nasopharyngeal Swab; Nasopharyngeal(NP) swabs in vial transport medium  Result Value Ref Range Status   SARS Coronavirus 2 by RT PCR POSITIVE (A) NEGATIVE Final    Comment: RESULT CALLED TO, READ BACK BY AND VERIFIED WITH: CHRISTY BELCHAN 09/16/20 @ 1930 BY S BEARD (NOTE) SARS-CoV-2 target nucleic acids are DETECTED.  The SARS-CoV-2 RNA is generally detectable in upper respiratory specimens during the acute phase of infection. Positive results are indicative of the presence of the identified virus, but do not rule out bacterial infection or co-infection with other pathogens not detected by the test. Clinical correlation with patient history and other diagnostic information is necessary to determine patient infection status. The expected result is Negative.  Fact Sheet for Patients: EntrepreneurPulse.com.au  Fact Sheet for Healthcare Providers: IncredibleEmployment.be  This test is not yet approved or cleared by the [place] FDA and  has been authorized for detection and/or diagnosis of SARS-CoV-2 by FDA under an  Emergency Use Authorization (EUA).  This EUA will remain in effect (meaning this tes t can be used) for the duration of  the COVID-19 declaration under Section 564(b)(1) of the Act, 21 U.S.C. section 360bbb-3(b)(1), unless the authorization is terminated or revoked sooner.     Influenza A by PCR NEGATIVE NEGATIVE Final   Influenza B by PCR NEGATIVE NEGATIVE Final    Comment: (NOTE) The Xpert Xpress SARS-CoV-2/FLU/RSV plus assay is intended as an aid in the diagnosis of influenza from Nasopharyngeal swab specimens and should not be used as a sole basis for treatment. Nasal washings and aspirates are unacceptable for Xpert Xpress SARS-CoV-2/FLU/RSV testing.  Fact Sheet for Patients: EntrepreneurPulse.com.au  Fact Sheet for Healthcare Providers: IncredibleEmployment.be  This test is not yet approved or cleared by the  [place] Architectural technologist and has been authorized for detection and/or diagnosis of SARS-CoV-2 by FDA under an Print production planner (EUA). This EUA will remain in effect (meaning this test can be used) for the duration of the COVID-19 declaration under Section 564(b)(1) of the Act, 21 U.S.C. section 360bbb-3(b)(1), unless the authorization is terminated or revoked.  Performed at [facility], [address] [place], [place] [address]      Labs: Basic Metabolic Panel: Recent Labs  Lab 09/16/20 1408 09/16/20 1453 09/17/20 0434  NA 134* 135 139  K 5.8* 5.8* 4.2  CL 105 106 109  CO2 22 22 24   GLUCOSE 122* 121* 91  BUN 29* 29* 24*  CREATININE 1.12 1.08 1.00  CALCIUM 8.2* 8.3* 8.4*   Liver Function Tests: Recent Labs  Lab 09/16/20 1408 09/17/20 0434  AST 13* 12*  ALT 13 11  ALKPHOS 63 56  BILITOT 0.6 0.6  PROT 5.9* 5.3*  ALBUMIN 3.6 3.2*    CBC: Recent Labs  Lab 09/16/20 1408 09/16/20 1913 09/17/20 0017 09/17/20 0434 09/17/20 0800  WBC 8.3  --   --   --   --   NEUTROABS 7.0  --   --   --   --   HGB 10.3*  9.1* 8.8* 9.0* 10.3*  HCT 32.7* 29.3* 27.8* 29.1* 33.6*  MCV 87.2  --   --   --   --   PLT 119*  --   --   --   --    BNP (last 3 results) Recent Labs    02/19/20 1652 08/15/20 1117  BNP 871.0* 1,010.2*     Signed:  Barton Dubois MD.  [facility] 09/17/2020, 1:18 PM

## 2020-09-17 NOTE — Progress Notes (Signed)
Nsg Discharge Note  Admit Date:  09/16/2020 Discharge date: 09/17/2020   [name] to be D/C'd Home per MD order.  AVS completed.  Copy for chart, and copy for patient signed, and dated. Patient/caregiver able to verbalize understanding.  Discharge Medication: Allergies as of 09/17/2020       Reactions   Brilinta [ticagrelor] Shortness Of Breath, Other (See Comments)   VERTIGO, also   Influenza Vaccines Other (See Comments)   Pt states he has been hospitalized both times he was given flu vaccine as a younger adult while in the [place] and they told him not to take it again   Lipitor [atorvastatin] Other (See Comments)   JOINT PAIN- tolerates if taking CO-Q 10   Fluoride Preparations Other (See Comments)   Reaction not recalled        Medication List     STOP taking these medications    mometasone 0.1 % cream Commonly known as: Elocon       TAKE these medications    acetaminophen 500 MG tablet Commonly known as: TYLENOL Take 500 mg by mouth every 6 (six) hours as needed for mild pain or moderate pain.   amLODipine 10 MG tablet Commonly known as: NORVASC Take 1 tablet (10 mg total) by mouth daily.   aspirin EC 81 MG tablet Take 81 mg by mouth daily.   atorvastatin 20 MG tablet Commonly known as: LIPITOR TAKE 1 TABLET(20 MG) BY MOUTH DAILY What changed: See the new instructions.   carvedilol 6.25 MG tablet Commonly known as: COREG Take 1 tablet (6.25 mg total) by mouth 2 (two) times daily with a meal.   clopidogrel 75 MG tablet Commonly known as: PLAVIX Take 1 tablet (75 mg total) by mouth daily.   furosemide 20 MG tablet Commonly known as: Lasix Take 1 tablet (20 mg total) by mouth daily as needed (weight gain of 3Lbs in 1 day or 5lbs in 2 days.).   hydrALAZINE 100 MG tablet Commonly known as: APRESOLINE Take 1 tablet (100 mg total) by mouth 3 (three) times daily.   isosorbide mononitrate 30 MG 24 hr tablet Commonly known as: IMDUR Take 1 tablet  (30 mg total) by mouth daily.   lisinopril 5 MG tablet Commonly known as: ZESTRIL Take 1 tablet (5 mg total) by mouth daily.   methylcellulose 1 % ophthalmic solution Commonly known as: ARTIFICIAL TEARS Place 1 drop into both eyes as needed (for dry eyes).   nitroGLYCERIN 0.4 MG SL tablet Commonly known as: NITROSTAT Place 1 tablet (0.4 mg total) under the tongue every 5 (five) minutes as needed for chest pain.   pantoprazole 40 MG tablet Commonly known as: PROTONIX Take 1 tablet (40 mg total) by mouth 2 (two) times daily. What changed:  medication strength how much to take   tamsulosin 0.4 MG Caps capsule Commonly known as: FLOMAX TAKE 1 CAPSULE(0.4 MG) BY MOUTH DAILY What changed: See the new instructions.   traMADol 50 MG tablet Commonly known as: ULTRAM TAKE 1 TABLET BY MOUTH THREE TIMES DAILY AS NEEDED FOR MODERATE PAIN What changed: See the new instructions.        Discharge Assessment: Vitals:   09/17/20 0502 09/17/20 0850  BP: 113/76 117/81  Pulse: 71 80  Resp: 18   Temp: (!) 97.4 F (36.3 C)   SpO2: 100% 97%   Skin clean, dry and intact without evidence of skin break down, no evidence of skin tears noted. IV catheter discontinued intact. Site without signs  and symptoms of complications - no redness or edema noted at insertion site, patient denies c/o pain - only slight tenderness at site.  Dressing with slight pressure applied.  D/c Instructions-Education: Discharge instructions given to patient/family with verbalized understanding. D/c education completed with patient/family including follow up instructions, medication list, d/c activities limitations if indicated, with other d/c instructions as indicated by MD - patient able to verbalize understanding, all questions fully answered. Patient instructed to return to ED, call 911, or call MD for any changes in condition.  Patient escorted via [place], and D/C home via private auto.  Dorcas Mcmurray,  LPN [phone] 8:24 PM

## 2020-09-19 ENCOUNTER — Ambulatory Visit: Payer: PPO

## 2020-09-19 ENCOUNTER — Other Ambulatory Visit: Payer: Self-pay | Admitting: Family Medicine

## 2020-09-19 ENCOUNTER — Telehealth: Payer: Self-pay

## 2020-09-19 NOTE — Telephone Encounter (Signed)
Call received from [place] mail order pharmacy about pantoprazole 40 mg prescription recently filled at local pharmacy. Would patient be increasing dosage or is this a one time dose. Pt has an upcoming appt on 10/03/20

## 2020-09-19 NOTE — Telephone Encounter (Signed)
This is a one-time direction on this medicine until he follows up for his recheck

## 2020-09-20 NOTE — Telephone Encounter (Addendum)
Notified Systems developer

## 2020-09-24 ENCOUNTER — Other Ambulatory Visit: Payer: Self-pay

## 2020-09-24 MED ORDER — AMLODIPINE BESYLATE 10 MG PO TABS: 10.0000 mg | ORAL_TABLET | Freq: Every day | ORAL | 0 refills | Status: DC

## 2020-09-24 MED ORDER — CLOPIDOGREL BISULFATE 75 MG PO TABS: 75.0000 mg | ORAL_TABLET | Freq: Every day | ORAL | 0 refills | Status: DC

## 2020-10-01 ENCOUNTER — Ambulatory Visit (HOSPITAL_COMMUNITY)
Admission: RE | Admit: 2020-10-01 | Discharge: 2020-10-01 | Disposition: A | Discharge status: Home / Self Care | Payer: PPO | Source: Ambulatory Visit | Attending: Cardiovascular Disease | Admitting: Cardiovascular Disease

## 2020-10-01 ENCOUNTER — Ambulatory Visit (HOSPITAL_BASED_OUTPATIENT_CLINIC_OR_DEPARTMENT_OTHER)
Admission: RE | Admit: 2020-10-01 | Discharge: 2020-10-01 | Disposition: A | Discharge status: Home / Self Care | Payer: PPO | Source: Ambulatory Visit | Attending: Cardiovascular Disease | Admitting: Cardiovascular Disease

## 2020-10-01 ENCOUNTER — Other Ambulatory Visit: Payer: Self-pay

## 2020-10-01 ENCOUNTER — Other Ambulatory Visit (HOSPITAL_COMMUNITY): Payer: Self-pay | Admitting: Cardiovascular Disease

## 2020-10-01 DIAGNOSIS — Z95820 Peripheral vascular angioplasty status with implants and grafts: Secondary | ICD-10-CM | POA: Diagnosis not present

## 2020-10-01 DIAGNOSIS — I739 Peripheral vascular disease, unspecified: Secondary | ICD-10-CM

## 2020-10-01 DIAGNOSIS — E782 Mixed hyperlipidemia: Secondary | ICD-10-CM

## 2020-10-01 DIAGNOSIS — Z95828 Presence of other vascular implants and grafts: Secondary | ICD-10-CM

## 2020-10-01 DIAGNOSIS — Z9889 Other specified postprocedural states: Secondary | ICD-10-CM

## 2020-10-01 DIAGNOSIS — I6522 Occlusion and stenosis of left carotid artery: Secondary | ICD-10-CM

## 2020-10-01 DIAGNOSIS — I2102 ST elevation (STEMI) myocardial infarction involving left anterior descending coronary artery: Secondary | ICD-10-CM

## 2020-10-03 ENCOUNTER — Encounter: Payer: Self-pay | Admitting: Family Medicine

## 2020-10-03 ENCOUNTER — Ambulatory Visit (INDEPENDENT_AMBULATORY_CARE_PROVIDER_SITE_OTHER): Payer: PPO | Admitting: Family Medicine

## 2020-10-03 ENCOUNTER — Other Ambulatory Visit: Payer: Self-pay

## 2020-10-03 VITALS — BP 126/76 | Temp 98.2°F | Wt 167.2 lb

## 2020-10-03 DIAGNOSIS — Z79899 Other long term (current) drug therapy: Secondary | ICD-10-CM

## 2020-10-03 DIAGNOSIS — I10 Essential (primary) hypertension: Secondary | ICD-10-CM | POA: Diagnosis not present

## 2020-10-03 DIAGNOSIS — R634 Abnormal weight loss: Secondary | ICD-10-CM

## 2020-10-03 DIAGNOSIS — K921 Melena: Secondary | ICD-10-CM

## 2020-10-03 DIAGNOSIS — D509 Iron deficiency anemia, unspecified: Secondary | ICD-10-CM

## 2020-10-03 NOTE — Progress Notes (Signed)
   Subjective:    Patient ID: [name] [name], male    DOB: Dec 03, 1934, 86 y.o.   MRN: [id number]  HPI Pt here for HTN follow up. Pt has brought in blood pressure readings. No issues with blood pressure.  Pt was in hospital on 09/16/20 for rectal bleeding. Pt contracted COVID after getting out of hospital.  Actually patient contracted COVID at the end of May. Patient's weight has been going down some.  He is trying to eat relatively healthy I have encouraged him to keep his weight between 165 and 175. Patient's blood pressure readings have been good at home higher here in the office Review of Systems     Objective:   Physical Exam  General-in no acute distress Eyes-no discharge Lungs-respiratory rate normal, CTA CV-no murmurs,RRR Extremities skin warm dry no edema Neuro grossly normal Behavior normal, alert       Assessment & Plan:  1. Iron deficiency anemia, unspecified iron deficiency anemia type Check lab work check stool test if stool test positive recommend colonoscopy patient deferred colonoscopy while in the hospital for rectal bleeding - IFOBT POC (occult bld, rslt in office); Future - CBC with Differential - Ferritin - Iron Binding Cap (TIBC)([facility]) - Hepatic function panel - Basic Metabolic Panel (BMET)  2. Weight loss If this continues will need CAT scan.  Patient to follow-up in 3 months.  Healthy diet recommended - IFOBT POC (occult bld, rslt in office); Future - CBC with Differential - Ferritin - Iron Binding Cap (TIBC)([facility]) - Hepatic function panel - Basic Metabolic Panel (BMET)  3. Hematochezia No reoccurrence at this point await labs and stool test for blood - IFOBT POC (occult bld, rslt in office); Future - CBC with Differential - Ferritin - Iron Binding Cap (TIBC)([facility]) - Hepatic function panel - Basic Metabolic Panel (BMET)  4. High risk medication use Lab work ordered - CBC with Differential -  Ferritin - Iron Binding Cap (TIBC)([facility]) - Hepatic function panel - Basic Metabolic Panel (BMET)  5. Essential hypertension, benign Blood pressure decent control continue current measures - CBC with Differential - Ferritin - Iron Binding Cap (TIBC)([facility]) - Hepatic function panel - Basic Metabolic Panel (BMET)  Follow-up sooner if any problems otherwise follow-up 3 months  May need CT scan if weight loss continues  May need GI with colonoscopy if positive for blood or significant iron deficient anemia

## 2020-10-04 ENCOUNTER — Other Ambulatory Visit: Payer: Self-pay

## 2020-10-04 DIAGNOSIS — R634 Abnormal weight loss: Secondary | ICD-10-CM

## 2020-10-04 DIAGNOSIS — K921 Melena: Secondary | ICD-10-CM

## 2020-10-04 DIAGNOSIS — D509 Iron deficiency anemia, unspecified: Secondary | ICD-10-CM

## 2020-10-04 LAB — BASIC METABOLIC PANEL
BUN/Creatinine Ratio: 19 (ref 10–24)
BUN: 22 mg/dL (ref 8–27)
CO2: 19 mmol/L — ABNORMAL LOW (ref 20–29)
Calcium: 8.8 mg/dL (ref 8.6–10.2)
Chloride: 104 mmol/L (ref 96–106)
Creatinine, Ser: 1.18 mg/dL (ref 0.76–1.27)
Glucose: 93 mg/dL (ref 65–99)
Potassium: 4.9 mmol/L (ref 3.5–5.2)
Sodium: 139 mmol/L (ref 134–144)
eGFR: 61 mL/min/{1.73_m2} (ref >59)

## 2020-10-04 LAB — CBC WITH DIFFERENTIAL/PLATELET
Basophils Absolute: 0.1 10*3/uL (ref 0.0–0.2)
Basos: 1 %
EOS (ABSOLUTE): 0.5 10*3/uL — ABNORMAL HIGH (ref 0.0–0.4)
Eos: 6 %
Hematocrit: 32.5 % — ABNORMAL LOW (ref 37.5–51.0)
Hemoglobin: 10.2 g/dL — ABNORMAL LOW (ref 13.0–17.7)
Immature Grans (Abs): 0 10*3/uL (ref 0.0–0.1)
Immature Granulocytes: 0 %
Lymphocytes Absolute: 0.8 10*3/uL (ref 0.7–3.1)
Lymphs: 10 %
MCH: 26 pg — ABNORMAL LOW (ref 26.6–33.0)
MCHC: 31.4 g/dL — ABNORMAL LOW (ref 31.5–35.7)
MCV: 83 fL (ref 79–97)
Monocytes Absolute: 0.8 10*3/uL (ref 0.1–0.9)
Monocytes: 9 %
Neutrophils Absolute: 6.5 10*3/uL (ref 1.4–7.0)
Neutrophils: 74 %
Platelets: 161 10*3/uL (ref 150–450)
RBC: 3.93 x10E6/uL — ABNORMAL LOW (ref 4.14–5.80)
RDW: 14.5 % (ref 11.6–15.4)
WBC: 8.7 10*3/uL (ref 3.4–10.8)

## 2020-10-04 LAB — IFOBT (OCCULT BLOOD): IFOBT: POSITIVE

## 2020-10-04 LAB — IRON AND TIBC
Iron Saturation: 4 % — CL (ref 15–55)
Iron: 15 ug/dL — ABNORMAL LOW (ref 38–169)
Total Iron Binding Capacity: 383 ug/dL (ref 250–450)
UIBC: 368 ug/dL — ABNORMAL HIGH (ref 111–343)

## 2020-10-04 LAB — HEPATIC FUNCTION PANEL
ALT: 9 IU/L (ref 0–44)
AST: 15 IU/L (ref 0–40)
Albumin: 4.5 g/dL (ref 3.6–4.6)
Alkaline Phosphatase: 79 IU/L (ref 44–121)
Bilirubin Total: 0.3 mg/dL (ref 0.0–1.2)
Bilirubin, Direct: 0.1 mg/dL (ref 0.00–0.40)
Total Protein: 6.5 g/dL (ref 6.0–8.5)

## 2020-10-04 LAB — FERRITIN: Ferritin: 32 ng/mL (ref 30–400)
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
## 2020-10-07 ENCOUNTER — Telehealth: Payer: Self-pay

## 2020-10-07 NOTE — Telephone Encounter (Signed)
-----   Message from Yetta Flock, MD sent at 10/07/2020  2:59 PM EDT ----- Herbert Seta can you please reach out to this patient, can book a follow up with me or APP, next available? Thanks  ----- Message ----- From: Kathyrn Drown, MD Sent: 10/06/2020   7:05 AM EDT To: Yetta Flock, MD  Hi Mellody Life is a patient of our practice.  You have seen him before.  Recently he had hematochezia and we referred him to the hospital for admission.  During this admission he deferred doing a colonoscopy.  On follow-up he claims no further hematochezia.  His lab work shows iron deficient anemia as well as a IFBOT was positive.  We have placed a referral to your office.  I am concerned that his iron deficient anemia and positive stool test reflects an issue other than a diverticular bleed.  I have encouraged the patient to be open minded when he sees you regarding the potential to have to go through further testing.  Appreciate your consultation and expertise.  Thanks-Scott [facility]

## 2020-10-07 NOTE — Telephone Encounter (Signed)
Lm on vm for patient to return call to schedule an appointment.

## 2020-10-08 NOTE — Telephone Encounter (Signed)
Patient returned call and has been scheduled for an appointment with Dr. Havery Moros on Friday, 11/08/20 at 10:30 AM.

## 2020-10-17 ENCOUNTER — Other Ambulatory Visit: Payer: Self-pay

## 2020-10-17 ENCOUNTER — Encounter: Payer: Self-pay | Admitting: Student

## 2020-10-17 ENCOUNTER — Ambulatory Visit (INDEPENDENT_AMBULATORY_CARE_PROVIDER_SITE_OTHER): Payer: PPO | Admitting: Student

## 2020-10-17 ENCOUNTER — Telehealth: Payer: Self-pay | Admitting: Family Medicine

## 2020-10-17 VITALS — BP 113/74 | HR 93 | Ht 68.0 in | Wt 169.6 lb

## 2020-10-17 DIAGNOSIS — I771 Stricture of artery: Secondary | ICD-10-CM

## 2020-10-17 DIAGNOSIS — E785 Hyperlipidemia, unspecified: Secondary | ICD-10-CM

## 2020-10-17 DIAGNOSIS — I739 Peripheral vascular disease, unspecified: Secondary | ICD-10-CM

## 2020-10-17 DIAGNOSIS — I10 Essential (primary) hypertension: Secondary | ICD-10-CM | POA: Diagnosis not present

## 2020-10-17 DIAGNOSIS — R011 Cardiac murmur, unspecified: Secondary | ICD-10-CM | POA: Diagnosis not present

## 2020-10-17 DIAGNOSIS — I2510 Atherosclerotic heart disease of native coronary artery without angina pectoris: Secondary | ICD-10-CM | POA: Diagnosis not present

## 2020-10-17 DIAGNOSIS — I5032 Chronic diastolic (congestive) heart failure: Secondary | ICD-10-CM

## 2020-10-17 DIAGNOSIS — D509 Iron deficiency anemia, unspecified: Secondary | ICD-10-CM

## 2020-10-17 DIAGNOSIS — I779 Disorder of arteries and arterioles, unspecified: Secondary | ICD-10-CM | POA: Diagnosis not present

## 2020-10-17 NOTE — Telephone Encounter (Signed)
Phone message created per provider: Please send me a phone message regarding patient having appointment with GI specialist regarding positive stool, that way it will serve as a reminder tocall him next week thank you

## 2020-10-17 NOTE — Patient Instructions (Signed)
Medication Instructions:  No Changes *If you need a refill on your cardiac medications before your next appointment, please call your pharmacy*   Lab Work: No Labs If you have labs (blood work) drawn today and your tests are completely normal, you will receive your results only by: [place] (if you have MyChart) OR A paper copy in the mail If you have any lab test that is abnormal or we need to change your treatment, we will call you to review the results.   Testing/Procedures: No Testing    Follow-Up: At [facility], you and your health needs are our priority.  As part of our continuing mission to provide you with exceptional heart care, we have created designated Provider Care Teams.  These Care Teams include your primary Cardiologist (physician) and Advanced Practice Providers (APPs -  Physician Assistants and Nurse Practitioners) who all work together to provide you with the care you need, when you need it.  We recommend signing up for the patient portal called "MyChart".  Sign up information is provided on this After Visit Summary.  MyChart is used to connect with patients for Virtual Visits (Telemedicine).  Patients are able to view lab/test results, encounter notes, upcoming appointments, etc.  Non-urgent messages can be sent to your provider as well.   To learn more about what you can do with MyChart, go to [email or web address].    Your next appointment:   November 27, 2020 10 AM  The format for your next appointment:   In Person  Provider:   Quay Burow, MD

## 2020-11-04 ENCOUNTER — Other Ambulatory Visit: Payer: Self-pay

## 2020-11-04 MED ORDER — LISINOPRIL 5 MG PO TABS: 5.0000 mg | ORAL_TABLET | Freq: Every day | ORAL | 1 refills | Status: DC

## 2020-11-06 ENCOUNTER — Encounter: Payer: Self-pay | Admitting: *Deleted

## 2020-11-08 ENCOUNTER — Encounter: Payer: Self-pay | Admitting: Gastroenterology

## 2020-11-08 ENCOUNTER — Other Ambulatory Visit: Payer: Self-pay

## 2020-11-08 ENCOUNTER — Ambulatory Visit: Payer: PPO | Admitting: Gastroenterology

## 2020-11-08 ENCOUNTER — Other Ambulatory Visit (INDEPENDENT_AMBULATORY_CARE_PROVIDER_SITE_OTHER): Payer: PPO

## 2020-11-08 VITALS — BP 128/74 | HR 66 | Ht 68.0 in | Wt 166.0 lb

## 2020-11-08 DIAGNOSIS — D509 Iron deficiency anemia, unspecified: Secondary | ICD-10-CM

## 2020-11-08 DIAGNOSIS — Z7902 Long term (current) use of antithrombotics/antiplatelets: Secondary | ICD-10-CM

## 2020-11-08 DIAGNOSIS — R195 Other fecal abnormalities: Secondary | ICD-10-CM | POA: Diagnosis not present

## 2020-11-08 LAB — CBC WITH DIFFERENTIAL/PLATELET
Basophils Absolute: 0 10*3/uL (ref 0.0–0.1)
Basophils Relative: 0.6 % (ref 0.0–3.0)
Eosinophils Absolute: 0.3 10*3/uL (ref 0.0–0.7)
Eosinophils Relative: 4.2 % (ref 0.0–5.0)
HCT: 37.8 % — ABNORMAL LOW (ref 39.0–52.0)
Hemoglobin: 11.6 g/dL — ABNORMAL LOW (ref 13.0–17.0)
Lymphocytes Relative: 10.4 % — ABNORMAL LOW (ref 12.0–46.0)
Lymphs Abs: 0.7 10*3/uL (ref 0.7–4.0)
MCHC: 30.8 g/dL (ref 30.0–36.0)
MCV: 79.5 fl (ref 78.0–100.0)
Monocytes Absolute: 0.5 10*3/uL (ref 0.1–1.0)
Monocytes Relative: 7.9 % (ref 3.0–12.0)
Neutro Abs: 5.1 10*3/uL (ref 1.4–7.7)
Neutrophils Relative %: 76.9 % (ref 43.0–77.0)
Platelets: 121 10*3/uL — ABNORMAL LOW (ref 150.0–400.0)
RBC: 4.75 Mil/uL (ref 4.22–5.81)
RDW: 16.5 % — ABNORMAL HIGH (ref 11.5–15.5)
WBC: 6.7 10*3/uL (ref 4.0–10.5)

## 2020-11-08 NOTE — Patient Instructions (Signed)
If you are age 84 or older, your body mass index should be between 23-30. Your Body mass index is 25.24 kg/m. If this is out of the aforementioned range listed, please consider follow up with your Primary Care Provider.  If you are age 36 or younger, your body mass index should be between 19-25. Your Body mass index is 25.24 kg/m. If this is out of the aformentioned range listed, please consider follow up with your Primary Care Provider.   __________________________________________________________  The [facility] providers would like to encourage you to use [facility] to communicate with providers for non-urgent requests or questions.  Due to long hold times on the telephone, sending your provider a message by [facility] may be a faster and more efficient way to get a response.  Please allow 48 business hours for a response.  Please remember that this is for non-urgent requests.   Please go to the lab in the basement of our building to have lab work done as you leave today. Hit "B" for basement when you get on the elevator.  When the doors open the lab is on your left.  We will call you with the results. Thank you.  It has been recommended to you by your physician that you have a(n) EGD and colonoscopy completed. Per your request, we did not schedule the procedure(s) today. Please contact our office at [phone] should you decide to have the procedure completed. You will be scheduled for a pre-visit and procedure at that time.  Continue oral iron.  Thank you for entrusting me with your care and for choosing [facility], Dr. Hingham Cellar
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
## 2020-11-18 ENCOUNTER — Other Ambulatory Visit: Payer: Self-pay

## 2020-11-18 ENCOUNTER — Ambulatory Visit (HOSPITAL_COMMUNITY)
Admission: RE | Admit: 2020-11-18 | Discharge: 2020-11-18 | Disposition: A | Discharge status: Home / Self Care | Payer: PPO | Source: Ambulatory Visit | Attending: Gastroenterology | Admitting: Gastroenterology

## 2020-11-18 DIAGNOSIS — K449 Diaphragmatic hernia without obstruction or gangrene: Secondary | ICD-10-CM | POA: Diagnosis not present

## 2020-11-18 DIAGNOSIS — R590 Localized enlarged lymph nodes: Secondary | ICD-10-CM | POA: Insufficient documentation

## 2020-11-18 DIAGNOSIS — I700 Atherosclerosis of aorta: Secondary | ICD-10-CM | POA: Diagnosis not present

## 2020-11-18 DIAGNOSIS — Z7902 Long term (current) use of antithrombotics/antiplatelets: Secondary | ICD-10-CM | POA: Diagnosis not present

## 2020-11-18 DIAGNOSIS — K769 Liver disease, unspecified: Secondary | ICD-10-CM | POA: Diagnosis not present

## 2020-11-18 DIAGNOSIS — R195 Other fecal abnormalities: Secondary | ICD-10-CM | POA: Diagnosis not present

## 2020-11-18 DIAGNOSIS — K5790 Diverticulosis of intestine, part unspecified, without perforation or abscess without bleeding: Secondary | ICD-10-CM | POA: Insufficient documentation

## 2020-11-18 DIAGNOSIS — D509 Iron deficiency anemia, unspecified: Secondary | ICD-10-CM | POA: Insufficient documentation

## 2020-11-18 DIAGNOSIS — I2510 Atherosclerotic heart disease of native coronary artery without angina pectoris: Secondary | ICD-10-CM | POA: Insufficient documentation

## 2020-11-18 DIAGNOSIS — I714 Abdominal aortic aneurysm, without rupture: Secondary | ICD-10-CM | POA: Diagnosis not present

## 2020-11-18 LAB — POCT I-STAT CREATININE: Creatinine, Ser: 1.2 mg/dL (ref 0.61–1.24)

## 2020-11-18 MED ORDER — IOHEXOL 350 MG/ML SOLN
80.0000 mL | Freq: Once | INTRAVENOUS | Status: AC | PRN
  Administered 2020-11-18: 80 mL via INTRAVENOUS

## 2020-11-20 ENCOUNTER — Other Ambulatory Visit: Payer: Self-pay

## 2020-11-20 ENCOUNTER — Other Ambulatory Visit (INDEPENDENT_AMBULATORY_CARE_PROVIDER_SITE_OTHER): Payer: PPO

## 2020-11-20 DIAGNOSIS — D509 Iron deficiency anemia, unspecified: Secondary | ICD-10-CM | POA: Diagnosis not present

## 2020-11-20 DIAGNOSIS — Z7902 Long term (current) use of antithrombotics/antiplatelets: Secondary | ICD-10-CM

## 2020-11-20 DIAGNOSIS — R195 Other fecal abnormalities: Secondary | ICD-10-CM

## 2020-11-20 DIAGNOSIS — K769 Liver disease, unspecified: Secondary | ICD-10-CM | POA: Diagnosis not present

## 2020-11-20 LAB — CBC WITH DIFFERENTIAL/PLATELET
Basophils Absolute: 0.1 10*3/uL (ref 0.0–0.1)
Basophils Relative: 0.8 % (ref 0.0–3.0)
Eosinophils Absolute: 0.3 10*3/uL (ref 0.0–0.7)
Eosinophils Relative: 4.7 % (ref 0.0–5.0)
HCT: 37.3 % — ABNORMAL LOW (ref 39.0–52.0)
Hemoglobin: 11.8 g/dL — ABNORMAL LOW (ref 13.0–17.0)
Lymphocytes Relative: 10.1 % — ABNORMAL LOW (ref 12.0–46.0)
Lymphs Abs: 0.7 10*3/uL (ref 0.7–4.0)
MCHC: 31.5 g/dL (ref 30.0–36.0)
MCV: 78.1 fl (ref 78.0–100.0)
Monocytes Absolute: 0.6 10*3/uL (ref 0.1–1.0)
Monocytes Relative: 8.3 % (ref 3.0–12.0)
Neutro Abs: 5.2 10*3/uL (ref 1.4–7.7)
Neutrophils Relative %: 76.1 % (ref 43.0–77.0)
Platelets: 130 10*3/uL — ABNORMAL LOW (ref 150.0–400.0)
RBC: 4.78 Mil/uL (ref 4.22–5.81)
RDW: 16.9 % — ABNORMAL HIGH (ref 11.5–15.5)
WBC: 6.9 10*3/uL (ref 4.0–10.5)

## 2020-11-20 LAB — HEPATIC FUNCTION PANEL
ALT: 10 U/L (ref 0–53)
AST: 14 U/L (ref 0–37)
Albumin: 4.1 g/dL (ref 3.5–5.2)
Alkaline Phosphatase: 68 U/L (ref 39–117)
Bilirubin, Direct: 0.1 mg/dL (ref 0.0–0.3)
Total Bilirubin: 0.4 mg/dL (ref 0.2–1.2)
Total Protein: 6.7 g/dL (ref 6.0–8.3)

## 2020-11-21 ENCOUNTER — Other Ambulatory Visit: Payer: Self-pay | Admitting: *Deleted

## 2020-11-21 LAB — CEA: CEA: 0.9 ng/mL

## 2020-11-21 LAB — AFP TUMOR MARKER: AFP-Tumor Marker: 2 ng/mL (ref <6.1)

## 2020-11-21 MED ORDER — PANTOPRAZOLE SODIUM 20 MG PO TBEC: 20.0000 mg | DELAYED_RELEASE_TABLET | Freq: Two times a day (BID) | ORAL | 0 refills | Status: DC

## 2020-11-22 ENCOUNTER — Ambulatory Visit (HOSPITAL_BASED_OUTPATIENT_CLINIC_OR_DEPARTMENT_OTHER)
Admission: RE | Admit: 2020-11-22 | Discharge: 2020-11-22 | Disposition: A | Discharge status: Home / Self Care | Payer: PPO | Source: Ambulatory Visit | Attending: Gastroenterology | Admitting: Gastroenterology

## 2020-11-22 ENCOUNTER — Other Ambulatory Visit: Payer: Self-pay

## 2020-11-22 ENCOUNTER — Other Ambulatory Visit (HOSPITAL_COMMUNITY): Payer: Self-pay

## 2020-11-22 DIAGNOSIS — I2510 Atherosclerotic heart disease of native coronary artery without angina pectoris: Secondary | ICD-10-CM | POA: Insufficient documentation

## 2020-11-22 DIAGNOSIS — Z7902 Long term (current) use of antithrombotics/antiplatelets: Secondary | ICD-10-CM | POA: Insufficient documentation

## 2020-11-22 DIAGNOSIS — R590 Localized enlarged lymph nodes: Secondary | ICD-10-CM | POA: Diagnosis not present

## 2020-11-22 DIAGNOSIS — D509 Iron deficiency anemia, unspecified: Secondary | ICD-10-CM | POA: Diagnosis not present

## 2020-11-22 DIAGNOSIS — I700 Atherosclerosis of aorta: Secondary | ICD-10-CM | POA: Diagnosis not present

## 2020-11-22 DIAGNOSIS — K769 Liver disease, unspecified: Secondary | ICD-10-CM | POA: Insufficient documentation

## 2020-11-22 DIAGNOSIS — R195 Other fecal abnormalities: Secondary | ICD-10-CM | POA: Insufficient documentation

## 2020-11-22 MED ORDER — IOHEXOL 350 MG/ML SOLN
50.0000 mL | Freq: Once | INTRAVENOUS | Status: AC | PRN
  Administered 2020-11-22: 50 mL via INTRAVENOUS

## 2020-11-26 ENCOUNTER — Ambulatory Visit: Payer: PPO | Admitting: Nurse Practitioner

## 2020-11-27 ENCOUNTER — Ambulatory Visit: Payer: PPO | Admitting: Cardiovascular Disease

## 2020-11-27 ENCOUNTER — Ambulatory Visit: Payer: PPO | Admitting: Gastroenterology

## 2020-11-27 ENCOUNTER — Other Ambulatory Visit: Payer: Self-pay

## 2020-11-27 ENCOUNTER — Encounter: Payer: Self-pay | Admitting: Cardiovascular Disease

## 2020-11-27 DIAGNOSIS — I739 Peripheral vascular disease, unspecified: Secondary | ICD-10-CM | POA: Diagnosis not present

## 2020-11-27 DIAGNOSIS — I5032 Chronic diastolic (congestive) heart failure: Secondary | ICD-10-CM | POA: Diagnosis not present

## 2020-11-27 DIAGNOSIS — I2102 ST elevation (STEMI) myocardial infarction involving left anterior descending coronary artery: Secondary | ICD-10-CM

## 2020-11-27 DIAGNOSIS — I10 Essential (primary) hypertension: Secondary | ICD-10-CM

## 2020-11-27 DIAGNOSIS — E782 Mixed hyperlipidemia: Secondary | ICD-10-CM

## 2020-11-27 NOTE — Assessment & Plan Note (Signed)
History of essential hypertension a blood pressure measured today 130/87.  He is on amlodipine, carvedilol, hydralazine and lisinopril.

## 2020-11-27 NOTE — Assessment & Plan Note (Signed)
History of carotid artery disease status post elective left carotid endarterectomy performed by Dr. Trula Slade 11/27/2015 which was followed by duplex ultrasound on annual basis.

## 2020-11-27 NOTE — Progress Notes (Signed)
11/27/2020 MANTAJ KOGLIN   04-18-1934  [id number]  Primary Physician Kathyrn Drown, MD Primary Cardiologist: Lorretta Harp MD [name], [place], [name]  HPI:  [name] [name] is a 86 y.o.  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 I last saw him in the office 08/22/2020.  He is accompanied by his wife today... He was admitted on 07/02/13 for Anterior STEMI. Presenting symptoms included severe epigastric burning pain/ indigestion and left arm pain. Initial troponin was > 20.0. He underwent emergent LHC and successful aspiration thrombectomy, PCI and stenting of proximal LAD by [name], via the right femoral artery . The overall LVEF was estimated at 50%, without wall motion abnormalities. He tolerated the procedure well and left the cath lab in stable condition. DAPT with ASA and Brilinta was initiated. He was also placed on a BB, ACE-I and statin. His post PCI course was w/o complication. He was discharged home on 07/05/13.   It should also be noted that at time of emergent LHC, he was found to also have significant peripheral vascular disease with an occluded left iliac and high-grade right common iliac artery stenosis, as well as a small AAA. The patient complained of bilateral LEE. [name] ordered for him to undergo bilateral LEAs. The study was performed in our office on 07/18/13. Findings are as follows: ABI 0.95 on Rt, 0.59 on Lt.the patient underwent angiography, diamondback orbital rotational arthrectomy, PTA and stenting of a highly calcified ostial right common iliac artery stenosis. He had excellent angiographic and clinical result. His Dopplers normalized to no longer has right lower extremity claudication. He wishes to proceed with attempt at percutaneous opacification of his left common iliac chronic total occlusion. He is not tolerating his Brilenta because of shortness of breath and we  subsequently transitioned to Plavix. He had a left brain stroke thought to be related to a watershed infarct from a hemodynamically significant right carotid stenosis in the setting of an occluded left carotid. He was evaluated by Dr. Erlinda Hong from [facility]. Dr. Erlinda Hong thought he would be a candidate for carotid stenting should he be found have a right internal carotid artery stenosis of greater than 70%.I intubated him on 05/15/14 revealing at most a 40-50% smooth right internal artery stenosis. I suspect his carotid ultrasound overestimated the degree of stenosis because the contralateral occlusion.  Since I saw him a year ago he's remained stable. He denies chest pain, shortness of breath or claudication. He does want to have a left total knee replacement. A pharmacologic Myoview stress test was intermediate risk with scar and peri-infarct ischemia. His EF is in the 45-55% range. Because of progressively worsening Doppler studies a CT scan scan was performed that showed progression of his right internal carotid artery stenosis compared to his prior CTA. He did have some right common carotid origin stenosis as well. He was neurologically asymptomatic. I did angiogram him approximately 18 months ago revealing at most 50-60% right ICA stenosis. I suspected that the Dopplers overestimated the degree of stenosis because of contralateral occlusion. Because of progression of disease he underwent angiography by myself 10/29/15 with the intent to perform right internal carotid artery stenting. Unfortunately, because of the degree of calcification and tortuosity and was never able to safely access the right common carotid artery and therefore aborted the procedure. The patient also underwent uncomplicated right carotid endarterectomy by Dr. Trula Slade  11/27/15 which he has nicely recovered from. Recent carotid Dopplers performed 01/15/16 revealed a widely patent right carotid endarterectomy site.   He was admitted  for 2 days in November with acute on chronic diastolic heart failure and was diuresed.  He was hypertensive and had chest pain at that time but MI was ruled out.    Since I saw him in the office 3 months ago he has done well.  He does apparently have new lesions on his liver and is seen by Dr. Havery Moros for GI evaluation as well as Dr. Marin Olp.  Colonoscopy is planned which I think he can undergo safely from a cardiac point of view.  He does complain of claudication although his recent Doppler showed widely patent right iliac stent.  He is aware of salt restriction and is on diuretics.   Current Meds  Medication Sig   acetaminophen (TYLENOL) 500 MG tablet Take 500 mg by mouth every 6 (six) hours as needed for mild pain or moderate pain.    amLODipine (NORVASC) 10 MG tablet Take 1 tablet (10 mg total) by mouth daily.   aspirin EC 81 MG tablet Take 81 mg by mouth daily.   atorvastatin (LIPITOR) 20 MG tablet TAKE 1 TABLET(20 MG) BY MOUTH DAILY   carvedilol (COREG) 6.25 MG tablet Take 1 tablet (6.25 mg total) by mouth 2 (two) times daily with a meal.   clopidogrel (PLAVIX) 75 MG tablet Take 1 tablet (75 mg total) by mouth daily.   ferrous sulfate 325 (65 FE) MG tablet Take 325 mg by mouth daily with breakfast.   furosemide (LASIX) 20 MG tablet Take 1 tablet (20 mg total) by mouth daily as needed (weight gain of 3Lbs in 1 day or 5lbs in 2 days.).   hydrALAZINE (APRESOLINE) 25 MG tablet Take 25 mg by mouth 3 (three) times daily.   isosorbide mononitrate (IMDUR) 30 MG 24 hr tablet Take 1 tablet (30 mg total) by mouth daily.   lisinopril (ZESTRIL) 5 MG tablet Take 1 tablet (5 mg total) by mouth daily.   methylcellulose (ARTIFICIAL TEARS) 1 % ophthalmic solution Place 1 drop into both eyes as needed (for dry eyes).   nitroGLYCERIN (NITROSTAT) 0.4 MG SL tablet Place 1 tablet (0.4 mg total) under the tongue every 5 (five) minutes as needed for chest pain.   pantoprazole (PROTONIX) 20 MG tablet Take 1  tablet (20 mg total) by mouth 2 (two) times daily.   tamsulosin (FLOMAX) 0.4 MG CAPS capsule TAKE 1 CAPSULE(0.4 MG) BY MOUTH DAILY   traMADol (ULTRAM) 50 MG tablet TAKE 1 TABLET BY MOUTH THREE TIMES DAILY AS NEEDED FOR MODERATE PAIN     Allergies  Allergen Reactions   Brilinta [Ticagrelor] Shortness Of Breath and Other (See Comments)    VERTIGO, also   Influenza Vaccines Other (See Comments)    Pt states he has been hospitalized both times he was given flu vaccine as a younger adult while in the [employer] and they told him not to take it again   Fluoride Preparations Other (See Comments)    Reaction not recalled    Social History   Socioeconomic History   Marital status: Married    Spouse name: Not on file   Number of children: 3   Years of education: ASSOCIATES   Highest education level: Not on file  Occupational History   Occupation: Retired  Tobacco Use   Smoking status: Never   Smokeless tobacco: Never  Substance and Sexual Activity  Alcohol use: No    Alcohol/week: 0.0 standard drinks   Drug use: No   Sexual activity: Not Currently  Other Topics Concern   Not on file  Social History Narrative   Patient is married with 3 children.   Patient is right handed.   Patient has his Associates degree.   Patient drinks 2-3 cups daily.   Social Determinants of Health   Financial Resource Strain: Not on file  Food Insecurity: Not on file  Transportation Needs: Not on file  Physical Activity: Not on file  Stress: Not on file  Social Connections: Not on file  Intimate Partner Violence: Not on file     Review of Systems: General: negative for chills, fever, night sweats or weight changes.  Cardiovascular: negative for chest pain, dyspnea on exertion, edema, orthopnea, palpitations, paroxysmal nocturnal dyspnea or shortness of breath Dermatological: negative for rash Respiratory: negative for cough or wheezing Urologic: negative for hematuria Abdominal: negative for nausea,  vomiting, diarrhea, bright red blood per rectum, melena, or hematemesis Neurologic: negative for visual changes, syncope, or dizziness All other systems reviewed and are otherwise negative except as noted above.    Blood pressure 130/87, pulse 76, height '5\' 8"'$  (1.727 m), weight 172 lb 3.2 oz (78.1 kg), SpO2 98 %.  General appearance: alert and no distress Neck: no adenopathy, no carotid bruit, no JVD, supple, symmetrical, trachea midline, and thyroid not enlarged, symmetric, no tenderness/mass/nodules Lungs: clear to auscultation bilaterally Heart: regular rate and rhythm, S1, S2 normal, no murmur, click, rub or gallop Extremities: extremities normal, atraumatic, no cyanosis or edema Pulses: 2+ and symmetric Skin: Skin color, texture, turgor normal. No rashes or lesions Neurologic: Grossly normal  EKG not performed today  ASSESSMENT AND PLAN:   Hyperlipidemia History of hyperlipidemia on statin therapy with lipid profile performed 01/22/2020 revealing total cholesterol of 91, LDL 41 HDL 28.  Hypertension History of essential hypertension a blood pressure measured today 130/87.  He is on amlodipine, carvedilol, hydralazine and lisinopril.  Occlusion and stenosis of carotid artery with cerebral infarction History of carotid artery disease status post elective left carotid endarterectomy performed by Dr. Trula Slade 11/27/2015 which was followed by duplex ultrasound on annual basis.  STEMI (ST elevation myocardial infarction) ([place]) History of CAD status post anterior STEMI 07/02/2013 with aspiration thrombectomy, PCI drug-eluting stenting of the proximal LAD via the right femoral approach.  His EF at that time was 50%.  He denies chest pain or shortness of breath.  Peripheral vascular disease ([name]) History of peripheral arterial disease with a known small distal abdominal aortic aneurysm, occluded left common iliac artery status post right common iliac orbital atherectomy, PTA and covered  stenting by myself 09/04/2013.  His most recent Doppler studies performed 10/01/2020 revealed this to be widely patent.  His abdominal aorta measures 4 cm.  CHF (congestive heart failure) (HCC) History of chronic diastolic heart failure with several admissions for this probably related to dietary indiscretion on oral diuretics.     Lorretta Harp MD FACP,FACC,FAHA, [facility] 11/27/2020 10:50 AM

## 2020-11-27 NOTE — Assessment & Plan Note (Signed)
History of CAD status post anterior STEMI 07/02/2013 with aspiration thrombectomy, PCI drug-eluting stenting of the proximal LAD via the right femoral approach.  His EF at that time was 50%.  He denies chest pain or shortness of breath.

## 2020-11-27 NOTE — Assessment & Plan Note (Signed)
History of hyperlipidemia on statin therapy with lipid profile performed 01/22/2020 revealing total cholesterol of 91, LDL 41 HDL 28.

## 2020-11-27 NOTE — Assessment & Plan Note (Signed)
History of peripheral arterial disease with a known small distal abdominal aortic aneurysm, occluded left common iliac artery status post right common iliac orbital atherectomy, PTA and covered stenting by myself 09/04/2013.  His most recent Doppler studies performed 10/01/2020 revealed this to be widely patent.  His abdominal aorta measures 4 cm.

## 2020-11-27 NOTE — Assessment & Plan Note (Signed)
History of chronic diastolic heart failure with several admissions for this probably related to dietary indiscretion on oral diuretics.

## 2020-11-27 NOTE — Patient Instructions (Addendum)

## 2020-11-29 ENCOUNTER — Inpatient Hospital Stay: Payer: PPO | Attending: Nurse Practitioner | Admitting: Oncology

## 2020-11-29 ENCOUNTER — Encounter (HOSPITAL_COMMUNITY): Payer: Self-pay | Admitting: Radiology

## 2020-11-29 ENCOUNTER — Other Ambulatory Visit: Payer: Self-pay

## 2020-11-29 VITALS — BP 132/93 | HR 87 | Temp 97.8°F | Resp 18 | Ht 68.0 in | Wt 171.4 lb

## 2020-11-29 DIAGNOSIS — C8518 Unspecified B-cell lymphoma, lymph nodes of multiple sites: Secondary | ICD-10-CM | POA: Insufficient documentation

## 2020-11-29 DIAGNOSIS — K769 Liver disease, unspecified: Secondary | ICD-10-CM

## 2020-11-29 DIAGNOSIS — D509 Iron deficiency anemia, unspecified: Secondary | ICD-10-CM | POA: Insufficient documentation

## 2020-11-29 DIAGNOSIS — I739 Peripheral vascular disease, unspecified: Secondary | ICD-10-CM | POA: Insufficient documentation

## 2020-11-29 DIAGNOSIS — R590 Localized enlarged lymph nodes: Secondary | ICD-10-CM

## 2020-11-29 DIAGNOSIS — I2510 Atherosclerotic heart disease of native coronary artery without angina pectoris: Secondary | ICD-10-CM | POA: Insufficient documentation

## 2020-11-29 DIAGNOSIS — I110 Hypertensive heart disease with heart failure: Secondary | ICD-10-CM | POA: Insufficient documentation

## 2020-11-29 DIAGNOSIS — I509 Heart failure, unspecified: Secondary | ICD-10-CM | POA: Insufficient documentation
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
## 2020-11-29 NOTE — Progress Notes (Signed)
Patient Name  [name] [name], [name] [name] Legal Sex  Male DOB  1934-08-08 SSN  [id number] Address  [address] [place] [address] Phone  [phone] (Home)    RE: US BIOPSY (LIVER) Received: Today Arne Cleveland, MD  Jillyn Hidden [place]   CT core L retroperitoneal LAN   DDH        Previous Messages   ----- Message -----  From: Garth Bigness D  Sent: 11/29/2020   2:19 PM EDT  To: Ir Procedure Requests  Subject: US BIOPSY (LIVER)                               Procedure:  US BIOPSY (LIVER)   Reason:  Please biopsy liver lesion, if cannot see liver lesion, then retroperitoneal node, Iron deficiency anemia, unspecified iron deficiency anemia type   History:  CT in computer   Provider:  Ladell Pier   Provider Contact:  [phone]

## 2020-11-30 ENCOUNTER — Other Ambulatory Visit: Payer: Self-pay | Admitting: Family Medicine

## 2020-12-04 ENCOUNTER — Encounter (HOSPITAL_COMMUNITY): Payer: Self-pay | Admitting: Radiology

## 2020-12-04 NOTE — Progress Notes (Signed)
Patient Name  [name] [name], [place] Legal Sex  Male DOB  1934-10-06 SSN  [id number] Address  [address] [place] [address] Phone  [phone] (Home)    RE: US BIOPSY (LIVER) Received: Today Kathyrn Drown, MD  Garth Bigness D In this situation it is necessary for him to get this biopsy so therefore with holding the Plavix for 5 days would be reasonable.  His interventional cardiologist is Dr. Quay Burow if you need a more detailed answer thanks-[facility]        Previous Messages   ----- Message -----  From: Garth Bigness D  Sent: 11/29/2020   2:21 PM EDT  To: Kathyrn Drown, MD  Subject: FW: US BIOPSY (LIVER)                           Good afternoon, [name], there is an order placed for [name] to have a biopsy done. Per protocol patient will need to hold plavix for 5 days prior. Please advise if okay to hold.  Thanks Aniceto Boss  ----- Message -----  From: Garth Bigness D  Sent: 11/29/2020   2:19 PM EDT  To: [place] Procedure Requests  Subject: US BIOPSY (LIVER)                               Procedure:  US BIOPSY (LIVER)   Reason:  Please biopsy liver lesion, if cannot see liver lesion, then retroperitoneal node, Iron deficiency anemia, unspecified iron deficiency anemia type   History:  CT in computer   Provider:  Ladell Pier   Provider Contact:  [phone]

## 2020-12-04 NOTE — Progress Notes (Signed)
Patient Name  [name], [name] Legal Sex  Male DOB  10-Apr-1934 SSN  [id number] Address  [address]  [place] [address] Phone  [phone] (Home)    RE: Biopsy/ Plavix Received: Today Lorretta Harp, MD  Garth Bigness D That's fine to hold plavix for 5 days.   JJB        Previous Messages   ----- Message -----  From: Garth Bigness D  Sent: 12/04/2020   8:44 AM EDT  To: Lorretta Harp, MD  Subject: Biopsy/ Plavix                                 Good morning, Dr. Benay Spice has requested that Mr. Maslowski has a biopsy done. I noticed that patient is on Plavix and will need to hold for 5 days prior to his Biopsy. Please advise if okay to hold. Thanks Aniceto Boss

## 2020-12-07 ENCOUNTER — Ambulatory Visit (INDEPENDENT_AMBULATORY_CARE_PROVIDER_SITE_OTHER): Payer: PPO | Admitting: Family Medicine

## 2020-12-07 DIAGNOSIS — Z0000 Encounter for general adult medical examination without abnormal findings: Secondary | ICD-10-CM
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
## 2020-12-11 ENCOUNTER — Other Ambulatory Visit: Payer: Self-pay | Admitting: Physician Assistant

## 2020-12-11 NOTE — Progress Notes (Signed)
I connected with [name] [name] on 12/07/20 by telephone and verified that I am speaking with the correct person using two identifiers. I spoke to patient for 45 minutes

## 2020-12-12 ENCOUNTER — Encounter (HOSPITAL_COMMUNITY): Payer: Self-pay

## 2020-12-12 ENCOUNTER — Inpatient Hospital Stay: Payer: PPO | Admitting: Oncology

## 2020-12-12 ENCOUNTER — Ambulatory Visit (HOSPITAL_COMMUNITY)
Admission: RE | Admit: 2020-12-12 | Discharge: 2020-12-12 | Disposition: A | Discharge status: Home / Self Care | Payer: PPO | Source: Ambulatory Visit | Attending: Oncology | Admitting: Oncology

## 2020-12-12 ENCOUNTER — Other Ambulatory Visit: Payer: Self-pay

## 2020-12-12 DIAGNOSIS — I509 Heart failure, unspecified: Secondary | ICD-10-CM | POA: Insufficient documentation

## 2020-12-12 DIAGNOSIS — K769 Liver disease, unspecified: Secondary | ICD-10-CM | POA: Diagnosis not present

## 2020-12-12 DIAGNOSIS — I2510 Atherosclerotic heart disease of native coronary artery without angina pectoris: Secondary | ICD-10-CM | POA: Diagnosis not present

## 2020-12-12 DIAGNOSIS — Z79899 Other long term (current) drug therapy: Secondary | ICD-10-CM | POA: Diagnosis not present

## 2020-12-12 DIAGNOSIS — I714 Abdominal aortic aneurysm, without rupture: Secondary | ICD-10-CM | POA: Diagnosis not present

## 2020-12-12 DIAGNOSIS — D509 Iron deficiency anemia, unspecified: Secondary | ICD-10-CM | POA: Insufficient documentation

## 2020-12-12 DIAGNOSIS — I110 Hypertensive heart disease with heart failure: Secondary | ICD-10-CM | POA: Insufficient documentation

## 2020-12-12 DIAGNOSIS — I739 Peripheral vascular disease, unspecified: Secondary | ICD-10-CM | POA: Diagnosis not present

## 2020-12-12 DIAGNOSIS — C8333 Diffuse large B-cell lymphoma, intra-abdominal lymph nodes: Secondary | ICD-10-CM | POA: Diagnosis not present

## 2020-12-12 DIAGNOSIS — R590 Localized enlarged lymph nodes: Secondary | ICD-10-CM | POA: Diagnosis not present

## 2020-12-12 DIAGNOSIS — Z7982 Long term (current) use of aspirin: Secondary | ICD-10-CM | POA: Insufficient documentation

## 2020-12-12 DIAGNOSIS — Z8673 Personal history of transient ischemic attack (TIA), and cerebral infarction without residual deficits: Secondary | ICD-10-CM | POA: Insufficient documentation

## 2020-12-12 DIAGNOSIS — K5790 Diverticulosis of intestine, part unspecified, without perforation or abscess without bleeding: Secondary | ICD-10-CM | POA: Insufficient documentation

## 2020-12-12 DIAGNOSIS — K449 Diaphragmatic hernia without obstruction or gangrene: Secondary | ICD-10-CM | POA: Insufficient documentation

## 2020-12-12 DIAGNOSIS — I700 Atherosclerosis of aorta: Secondary | ICD-10-CM | POA: Insufficient documentation

## 2020-12-12 DIAGNOSIS — Z7902 Long term (current) use of antithrombotics/antiplatelets: Secondary | ICD-10-CM | POA: Diagnosis not present

## 2020-12-12 DIAGNOSIS — Z7901 Long term (current) use of anticoagulants: Secondary | ICD-10-CM | POA: Insufficient documentation

## 2020-12-12 DIAGNOSIS — K219 Gastro-esophageal reflux disease without esophagitis: Secondary | ICD-10-CM | POA: Diagnosis not present

## 2020-12-12 DIAGNOSIS — I252 Old myocardial infarction: Secondary | ICD-10-CM | POA: Diagnosis not present

## 2020-12-12 DIAGNOSIS — J948 Other specified pleural conditions: Secondary | ICD-10-CM | POA: Diagnosis not present

## 2020-12-12 LAB — CBC
HCT: 40.4 % (ref 39.0–52.0)
Hemoglobin: 12.2 g/dL — ABNORMAL LOW (ref 13.0–17.0)
MCH: 24.3 pg — ABNORMAL LOW (ref 26.0–34.0)
MCHC: 30.2 g/dL (ref 30.0–36.0)
MCV: 80.3 fL (ref 80.0–100.0)
Platelets: 139 10*3/uL — ABNORMAL LOW (ref 150–400)
RBC: 5.03 MIL/uL (ref 4.22–5.81)
RDW: 16.7 % — ABNORMAL HIGH (ref 11.5–15.5)
WBC: 7.7 10*3/uL (ref 4.0–10.5)
nRBC: 0 % (ref 0.0–0.2)

## 2020-12-12 LAB — PROTIME-INR
INR: 1 (ref 0.8–1.2)
Prothrombin Time: 13.4 seconds (ref 11.4–15.2)

## 2020-12-12 MED ORDER — MIDAZOLAM HCL 2 MG/2ML IJ SOLN
INTRAMUSCULAR | Status: DC | PRN
  Administered 2020-12-12: 1 mg via INTRAVENOUS

## 2020-12-12 MED ORDER — LIDOCAINE HCL (PF) 1 % IJ SOLN
INTRAMUSCULAR | Status: DC | PRN
  Administered 2020-12-12: 10 mL via INTRADERMAL

## 2020-12-12 MED ORDER — FENTANYL CITRATE (PF) 100 MCG/2ML IJ SOLN
INTRAMUSCULAR | Status: DC | PRN
  Administered 2020-12-12: 50 ug via INTRAVENOUS

## 2020-12-12 MED ORDER — FENTANYL CITRATE (PF) 100 MCG/2ML IJ SOLN
INTRAMUSCULAR | Status: AC
  Filled 2020-12-12: qty 2

## 2020-12-12 MED ORDER — FLUMAZENIL 0.5 MG/5ML IV SOLN
INTRAVENOUS | Status: AC
  Filled 2020-12-12: qty 5

## 2020-12-12 MED ORDER — MIDAZOLAM HCL 2 MG/2ML IJ SOLN
INTRAMUSCULAR | Status: AC
  Filled 2020-12-12: qty 4

## 2020-12-12 MED ORDER — SODIUM CHLORIDE 0.9 % IV SOLN: INTRAVENOUS | Status: DC

## 2020-12-12 MED ORDER — NALOXONE HCL 0.4 MG/ML IJ SOLN
INTRAMUSCULAR | Status: AC
  Filled 2020-12-12: qty 1

## 2020-12-12 NOTE — Discharge Instructions (Signed)
Please call Interventional Radiology clinic [phone] with any questions or concerns.   You may remove your dressing and shower tomorrow, Friday after 12:00pm.   Liver Biopsy, Care After After a liver biopsy, it is common to have these things in the area where the biopsy was done. You may: Have pain. Feel sore. Have bruising. You may also feel tired for a few days. Follow these instructions at home: Medicines Take over-the-counter and prescription medicines only as told by your doctor. If you were prescribed an antibiotic medicine, take it as told by your doctor. Do not stop taking the antibiotic, even if you start to feel better. Do not take medicines that may thin your blood. These medicines include aspirin and ibuprofen. Take them only if your doctor tells you to. If told, take steps to prevent problems with pooping (constipation). You may need to: Drink enough fluid to keep your pee (urine) pale yellow. Take medicines. You will be told what medicines to take. Eat foods that are high in fiber. These include beans, whole grains, and fresh fruits and vegetables. Limit foods that are high in fat and sugar. These include fried or sweet foods. Ask your doctor if you should avoid driving or using machines while you are taking your medicine. Caring for your incision Follow instructions from your doctor about how to take care of your cut from surgery (incisions). Make sure you: Wash your hands with soap and water for at least 20 seconds before and after you change your bandage. If you cannot use soap and water, use hand sanitizer. Change your bandage. Leavestitches or skin glue in place for at least two weeks. Leave tape strips alone unless you are told to take them off. You may trim the edges of the tape strips if they curl up. Check your incision every day for signs of infection. Check for: Redness, swelling, or more pain. Fluid or blood. Warmth. Pus or a bad smell. Do not take  baths, swim, or use a hot tub. Ask your doctor about taking showers or sponge baths. Activity Rest at home for 1-2 days, or as told by your doctor. Get up to take short walks every 1 to 2 hours. Ask for help if you feel weak or unsteady. Do not lift anything that is heavier than 10 lb (4.5 kg), or the limit that you are told. Do not play contact sports for 2 weeks after the procedure. Return to your normal activities as told by your doctor. Ask what activities are safe for you. General instructions  Do not drink alcohol in the first week after the procedure. Plan to have a responsible adult care for you for the time you are told after you leave the hospital or clinic. This is important. It is up to you to get the results of your procedure. Ask how to get your results when they are ready. Keep all follow-up visits.   Contact a doctor if: You have more bleeding in your incision. Your incision swells, or is red and more painful. You have fluid that comes from your incision. You develop a rash. You have fever or chills. Get help right away if: You have swelling, bloating, or pain in your belly (abdomen). You get dizzy or faint. You vomit or you feel like vomiting. You have trouble breathing or feel short of breath. You have chest pain. You have problems talking or seeing. You have trouble with your balance or moving your arms or legs. These symptoms may be an  emergency. Get help right away. Call your local emergency services (911 in the [place] Do not wait to see if the symptoms will go away. Do not drive yourself to the hospital. Summary After the procedure, it is common to have pain, soreness, bruising, and tiredness. Your doctor will tell you how to take care of yourself at home. Change your bandage, take your medicines, and limit your activities as told by your doctor. Call your doctor if you have symptoms of infection. Get help right away if your belly swells, your cut bleeds a lot,  or you have trouble talking or breathing. This information is not intended to replace advice given to you by your health care provider. Make sure you discuss any questions you have with your healthcare provider. Document Revised: 01/29/2020 Document Reviewed: 01/29/2020 Elsevier Patient Education  2022 [place].     Moderate Conscious Sedation, Adult, Care After This sheet gives you information about how to care for yourself after your procedure. Your health care provider may also give you more specific instructions. If you have problems or questions, contact your health care provider. What can I expect after the procedure? After the procedure, it is common to have: Sleepiness for several hours. Impaired judgment for several hours. Difficulty with balance. Vomiting if you eat too soon. Follow these instructions at home: For the time period you were told by your health care provider: Rest. Do not participate in activities where you could fall or become injured. Do not drive or use machinery. Do not drink alcohol. Do not take sleeping pills or medicines that cause drowsiness. Do not make important decisions or sign legal documents. Do not take care of children on your own.        Eating and drinking Follow the diet recommended by your health care provider. Drink enough fluid to keep your urine pale yellow. If you vomit: Drink water, juice, or soup when you can drink without vomiting. Make sure you have little or no nausea before eating solid foods.    General instructions Take over-the-counter and prescription medicines only as told by your health care provider. Have a responsible adult stay with you for the time you are told. It is important to have someone help care for you until you are awake and alert. Do not smoke. Keep all follow-up visits as told by your health care provider. This is important. Contact a health care provider if: You are still sleepy or having  trouble with balance after 24 hours. You feel light-headed. You keep feeling nauseous or you keep vomiting. You develop a rash. You have a fever. You have redness or swelling around the IV site. Get help right away if: You have trouble breathing. You have new-onset confusion at home. Summary After the procedure, it is common to feel sleepy, have impaired judgment, or feel nauseous if you eat too soon. Rest after you get home. Know the things you should not do after the procedure. Follow the diet recommended by your health care provider and drink enough fluid to keep your urine pale yellow. Get help right away if you have trouble breathing or new-onset confusion at home. This information is not intended to replace advice given to you by your health care provider. Make sure you discuss any questions you have with your health care provider. Document Revised: 07/14/2019 Document Reviewed: 02/09/2019 Elsevier Patient Education  2021 [employer].

## 2020-12-12 NOTE — Procedures (Signed)
Interventional Radiology Procedure Note  Procedure: CT Guided Biopsy of left retroperitoneal lymph node  Complications: None  Estimated Blood Loss: < 10 mL  Findings: 106 G core biopsy of left para-aortic RP LN performed under CT guidance.  Three core samples obtained and sent to Pathology.  Venetia Night. Kathlene Cote, M.D Pager:  [phone]
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
## 2020-12-17 ENCOUNTER — Other Ambulatory Visit: Payer: Self-pay | Admitting: *Deleted

## 2020-12-17 MED ORDER — TAMSULOSIN HCL 0.4 MG PO CAPS: ORAL_CAPSULE | ORAL | 0 refills | Status: DC

## 2020-12-18 LAB — SURGICAL PATHOLOGY

## 2020-12-19 ENCOUNTER — Telehealth: Payer: Self-pay | Admitting: Family Medicine

## 2020-12-19 ENCOUNTER — Other Ambulatory Visit: Payer: Self-pay

## 2020-12-19 ENCOUNTER — Inpatient Hospital Stay: Payer: PPO | Admitting: Oncology

## 2020-12-19 VITALS — BP 138/86 | HR 84 | Temp 98.3°F | Resp 18 | Ht 68.0 in | Wt 168.7 lb

## 2020-12-19 DIAGNOSIS — D509 Iron deficiency anemia, unspecified: Secondary | ICD-10-CM | POA: Diagnosis not present

## 2020-12-19 DIAGNOSIS — I110 Hypertensive heart disease with heart failure: Secondary | ICD-10-CM | POA: Diagnosis not present

## 2020-12-19 DIAGNOSIS — C8518 Unspecified B-cell lymphoma, lymph nodes of multiple sites: Secondary | ICD-10-CM | POA: Diagnosis not present

## 2020-12-19 DIAGNOSIS — I2510 Atherosclerotic heart disease of native coronary artery without angina pectoris: Secondary | ICD-10-CM | POA: Diagnosis not present

## 2020-12-19 DIAGNOSIS — I739 Peripheral vascular disease, unspecified: Secondary | ICD-10-CM | POA: Diagnosis not present

## 2020-12-19 DIAGNOSIS — I509 Heart failure, unspecified: Secondary | ICD-10-CM | POA: Diagnosis not present

## 2020-12-19 NOTE — Progress Notes (Signed)
  [facility] OFFICE PROGRESS NOTE   Diagnosis: Non-Hodgkin's lymphoma  INTERVAL HISTORY:   [name] [name] returns as scheduled.  He feels well.  No fever or night sweats.  No bleeding.  Good appetite.  No complaint.  He is here today with his wife and daughter.  He underwent a CT-guided biopsy of the left retroperitoneal lymph node on 12/12/2020.  The pathology revealed large B-cell lymphoma, CD5, CD20 positive.  CD10 negative.  The KIA-6 7 returned at 70-80%.  A FISH panel is pending.  Objective:  Vital signs in last 24 hours:  Blood pressure 138/86, pulse 84, temperature 98.3 F (36.8 C), temperature source Oral, resp. rate 18, height 5\' 8"  (1.727 m), weight 168 lb 11.2 oz (76.5 kg), SpO2 97 %.    Lymphatics: No cervical, supraclavicular, or inguinal nodes.  Soft mobile 2 cm right axillary node versus a prominent fat pad, prominent left axillary fat pad Resp: Lungs clear bilaterally Cardio: Regular rate and rhythm GI: No hepatosplenomegaly, no mass, nontender Vascular: No leg edema   Lab Results:  Lab Results  Component Value Date   WBC 7.7 12/12/2020   HGB 12.2 (L) 12/12/2020   HCT 40.4 12/12/2020   MCV 80.3 12/12/2020   PLT 139 (L) 12/12/2020   NEUTROABS 5.2 11/20/2020    CMP  Lab Results  Component Value Date   NA 139 10/03/2020   K 4.9 10/03/2020   CL 104 10/03/2020   CO2 19 (L) 10/03/2020   GLUCOSE 93 10/03/2020   BUN 22 10/03/2020   CREATININE 1.20 11/18/2020   CALCIUM 8.8 10/03/2020   PROT 6.7 11/20/2020   ALBUMIN 4.1 11/20/2020   AST 14 11/20/2020   ALT 10 11/20/2020   ALKPHOS 68 11/20/2020   BILITOT 0.4 11/20/2020   GFRNONAA >60 09/17/2020   GFRAA 59 (L) 03/26/2020    Lab Results  Component Value Date   CEA 0.9 11/20/2020    Medications: I have reviewed the patient's current medications.   Assessment/Plan: Large B-cell lymphoma CT abdomen/pelvis 11/18/2020-2.4 x 2.3 segment 7 lesion, multiple additional subcentimeter lesions,  some but not all present in 2016 CT chest 11/23/2020-11 mm right retrocrural node, asbestosis related pleural disease CT-guided biopsy of the left periaortic lymph node 12/12/2020-large B-cell lymphoma, CD5 positive, CD20 positive Iron deficiency anemia Coronary artery disease Peripheral vascular disease CHF Hypertension Carotid artery disease CVA    Disposition: [name] [name] appears stable.  He has been diagnosed with large B-cell lymphoma.  I discussed the diagnosis and treatment options with [name] [name] and his family.  It is unclear whether the rectal bleeding he experienced earlier this summer is related to lymphoma.  It is also unclear whether the liver is involved with lymphoma.  He will undergo a staging PET scan.  He will return for an office visit, baseline labs, and a chemotherapy teaching class on 01/01/2021.  We will most likely recommend Bendamustine/rituximab if he is confirmed to have advanced stage lymphoma.    Betsy Coder, MD  12/19/2020  8:51 AM

## 2020-12-19 NOTE — Telephone Encounter (Signed)
Pamala Hurry (wife, 01/02/1936) called to verify appointments and stated that [name] was diagnosed with lymphoma this morning. Stated that he would be having a lot of blood work done and inquired about labs for upcoming appointment. She wanted to get hose done before their appointment. FYI.  CB#  [phone]

## 2020-12-20 NOTE — Telephone Encounter (Signed)
Pt wife contacted and verbalized understanding.  

## 2020-12-20 NOTE — Telephone Encounter (Signed)
The blood work that was ordered by Dr.Sherrill overall looks good and appropriate.  Definitely go forward with any blood work that they have ordered I do not have additional blood work to add to that at this time.  We look forward to [name] [name]'s visit.  I am aware of his recent visits and testing with Dr.Sherrill-sorry to hear that he is having to deal with a diagnosis of lymphoma but I am hopeful that there will be good treatments that can be done.  We will talk soon thanks

## 2020-12-27 ENCOUNTER — Encounter (HOSPITAL_COMMUNITY): Payer: Self-pay | Admitting: Oncology

## 2021-01-01 ENCOUNTER — Inpatient Hospital Stay: Payer: PPO | Admitting: Oncology

## 2021-01-01 ENCOUNTER — Inpatient Hospital Stay: Payer: PPO

## 2021-01-01 ENCOUNTER — Ambulatory Visit (HOSPITAL_COMMUNITY)
Admission: RE | Admit: 2021-01-01 | Discharge: 2021-01-01 | Disposition: A | Discharge status: Home / Self Care | Payer: PPO | Source: Ambulatory Visit | Attending: Oncology | Admitting: Oncology

## 2021-01-01 ENCOUNTER — Other Ambulatory Visit: Payer: Self-pay

## 2021-01-01 DIAGNOSIS — D509 Iron deficiency anemia, unspecified: Secondary | ICD-10-CM | POA: Diagnosis not present

## 2021-01-01 DIAGNOSIS — C787 Secondary malignant neoplasm of liver and intrahepatic bile duct: Secondary | ICD-10-CM | POA: Diagnosis not present

## 2021-01-01 DIAGNOSIS — R590 Localized enlarged lymph nodes: Secondary | ICD-10-CM | POA: Insufficient documentation

## 2021-01-01 DIAGNOSIS — I714 Abdominal aortic aneurysm, without rupture, unspecified: Secondary | ICD-10-CM | POA: Insufficient documentation

## 2021-01-01 DIAGNOSIS — C8330 Diffuse large B-cell lymphoma, unspecified site: Secondary | ICD-10-CM | POA: Diagnosis not present

## 2021-01-01 LAB — GLUCOSE, CAPILLARY: Glucose-Capillary: 98 mg/dL (ref 70–99)

## 2021-01-01 MED ORDER — FLUDEOXYGLUCOSE F - 18 (FDG) INJECTION
8.4000 | Freq: Once | INTRAVENOUS | Status: AC | PRN
  Administered 2021-01-01: 8.3 via INTRAVENOUS

## 2021-01-06 ENCOUNTER — Other Ambulatory Visit: Payer: Self-pay

## 2021-01-06 ENCOUNTER — Encounter: Payer: Self-pay | Admitting: Family Medicine

## 2021-01-06 ENCOUNTER — Ambulatory Visit: Payer: PPO | Admitting: Family Medicine

## 2021-01-06 ENCOUNTER — Ambulatory Visit (INDEPENDENT_AMBULATORY_CARE_PROVIDER_SITE_OTHER): Payer: PPO | Admitting: Family Medicine

## 2021-01-06 VITALS — BP 146/92 | HR 81 | Temp 98.1°F | Wt 171.0 lb

## 2021-01-06 DIAGNOSIS — I7143 Infrarenal abdominal aortic aneurysm, without rupture: Secondary | ICD-10-CM | POA: Diagnosis not present

## 2021-01-06 DIAGNOSIS — G609 Hereditary and idiopathic neuropathy, unspecified: Secondary | ICD-10-CM

## 2021-01-06 DIAGNOSIS — I10 Essential (primary) hypertension: Secondary | ICD-10-CM

## 2021-01-06 MED ORDER — TRAMADOL HCL 50 MG PO TABS: ORAL_TABLET | ORAL | 5 refills | Status: DC

## 2021-01-06 NOTE — Patient Instructions (Signed)
Please get your B12 level drawn at the oncologist  Please follow-up here in 4 months  Thanks-Dr. Nicki Reaper

## 2021-01-06 NOTE — Progress Notes (Signed)
   Subjective:    Patient ID: [name] [name], male    DOB: [birth year], 86 y.o.   MRN: [id number]  HPI Pt here for 3 month follow up. Pt recently diagnosed Lymphoma. Last week pt had PET scan. Blood pressure had been good on left arm. No issues. Taking meds as prescribed. Currently not taking Plavix.   He is taking his blood pressure medicine and his cholesterol medicine. Recently had a CAT scan which showed a infra renal abdominal aortic dilation is recommended to have another scan in 2 years or ultrasound.  Patient states this is being followed by Dr. Alvester Chou Patient relates urination doing okay with tamsulosin Has arthritic discomforts for which he takes a tramadol on a regular basis but no more than twice per day  Review of Systems     Objective:   Physical Exam  Lungs clear heart regular extremities no edema      Assessment & Plan:  Lymphoma-follow through with oncology  Blood pressure good control continue current measures  Hyperlipidemia taking medicine tolerating well continue these  Peripheral neuropathy check vitamin B12 level he will get this drawn through hematology  BPH doing well  Chronic joint pain discomfort tramadol refills given  Follow-up 4 months

## 2021-01-07 NOTE — Progress Notes (Signed)
Message placed in reminder file

## 2021-01-08 ENCOUNTER — Inpatient Hospital Stay: Payer: PPO

## 2021-01-08 ENCOUNTER — Inpatient Hospital Stay: Payer: PPO | Attending: Oncology

## 2021-01-08 ENCOUNTER — Other Ambulatory Visit: Payer: Self-pay | Admitting: *Deleted

## 2021-01-08 ENCOUNTER — Other Ambulatory Visit (HOSPITAL_COMMUNITY): Payer: Self-pay

## 2021-01-08 ENCOUNTER — Other Ambulatory Visit (HOSPITAL_BASED_OUTPATIENT_CLINIC_OR_DEPARTMENT_OTHER): Payer: Self-pay

## 2021-01-08 ENCOUNTER — Encounter: Payer: Self-pay | Admitting: *Deleted

## 2021-01-08 ENCOUNTER — Other Ambulatory Visit: Payer: Self-pay

## 2021-01-08 ENCOUNTER — Inpatient Hospital Stay: Payer: PPO | Admitting: Oncology

## 2021-01-08 VITALS — BP 138/91 | HR 86 | Temp 97.8°F | Resp 18 | Ht 68.0 in | Wt 169.1 lb

## 2021-01-08 DIAGNOSIS — Z5111 Encounter for antineoplastic chemotherapy: Secondary | ICD-10-CM | POA: Insufficient documentation

## 2021-01-08 DIAGNOSIS — Z5112 Encounter for antineoplastic immunotherapy: Secondary | ICD-10-CM | POA: Diagnosis not present

## 2021-01-08 DIAGNOSIS — C8580 Other specified types of non-Hodgkin lymphoma, unspecified site: Secondary | ICD-10-CM

## 2021-01-08 DIAGNOSIS — C8518 Unspecified B-cell lymphoma, lymph nodes of multiple sites: Secondary | ICD-10-CM | POA: Diagnosis not present

## 2021-01-08 DIAGNOSIS — C8510 Unspecified B-cell lymphoma, unspecified site: Secondary | ICD-10-CM

## 2021-01-08 DIAGNOSIS — D509 Iron deficiency anemia, unspecified: Secondary | ICD-10-CM

## 2021-01-08 LAB — CBC WITH DIFFERENTIAL (CANCER CENTER ONLY)
Abs Immature Granulocytes: 0.03 10*3/uL (ref 0.00–0.07)
Basophils Absolute: 0.1 10*3/uL (ref 0.0–0.1)
Basophils Relative: 1 %
Eosinophils Absolute: 0.5 10*3/uL (ref 0.0–0.5)
Eosinophils Relative: 5 %
HCT: 42.1 % (ref 39.0–52.0)
Hemoglobin: 12.9 g/dL — ABNORMAL LOW (ref 13.0–17.0)
Immature Granulocytes: 0 %
Lymphocytes Relative: 8 %
Lymphs Abs: 0.7 10*3/uL (ref 0.7–4.0)
MCH: 24.5 pg — ABNORMAL LOW (ref 26.0–34.0)
MCHC: 30.6 g/dL (ref 30.0–36.0)
MCV: 79.9 fL — ABNORMAL LOW (ref 80.0–100.0)
Monocytes Absolute: 0.7 10*3/uL (ref 0.1–1.0)
Monocytes Relative: 8 %
Neutro Abs: 6.8 10*3/uL (ref 1.7–7.7)
Neutrophils Relative %: 78 %
Platelet Count: 121 10*3/uL — ABNORMAL LOW (ref 150–400)
RBC: 5.27 MIL/uL (ref 4.22–5.81)
RDW: 20.2 % — ABNORMAL HIGH (ref 11.5–15.5)
WBC Count: 8.7 10*3/uL (ref 4.0–10.5)
nRBC: 0 % (ref 0.0–0.2)

## 2021-01-08 LAB — CMP (CANCER CENTER ONLY)
ALT: 10 U/L (ref 0–44)
AST: 14 U/L — ABNORMAL LOW (ref 15–41)
Albumin: 4.1 g/dL (ref 3.5–5.0)
Alkaline Phosphatase: 71 U/L (ref 38–126)
Anion gap: 8 (ref 5–15)
BUN: 23 mg/dL (ref 8–23)
CO2: 25 mmol/L (ref 22–32)
Calcium: 8.9 mg/dL (ref 8.9–10.3)
Chloride: 107 mmol/L (ref 98–111)
Creatinine: 1.16 mg/dL (ref 0.61–1.24)
GFR, Estimated: >60 mL/min (ref >60)
Glucose, Bld: 101 mg/dL — ABNORMAL HIGH (ref 70–99)
Potassium: 4.4 mmol/L (ref 3.5–5.1)
Sodium: 140 mmol/L (ref 135–145)
Total Bilirubin: 0.4 mg/dL (ref 0.3–1.2)
Total Protein: 6.5 g/dL (ref 6.5–8.1)

## 2021-01-08 LAB — LACTATE DEHYDROGENASE: LDH: 209 U/L — ABNORMAL HIGH (ref 98–192)

## 2021-01-08 LAB — HEPATITIS B CORE ANTIBODY, TOTAL: Hep B Core Total Ab: NONREACTIVE

## 2021-01-08 LAB — URIC ACID: Uric Acid, Serum: 6.2 mg/dL (ref 3.7–8.6)

## 2021-01-08 LAB — HEPATITIS B SURFACE ANTIGEN: Hepatitis B Surface Ag: NONREACTIVE

## 2021-01-08 MED ORDER — PROCHLORPERAZINE MALEATE 10 MG PO TABS
10.0000 mg | ORAL_TABLET | Freq: Four times a day (QID) | ORAL | 0 refills | Status: DC | PRN
  Filled 2021-01-08: qty 30, 8d supply, fill #0

## 2021-01-08 MED ORDER — ETOPOSIDE 50 MG PO CAPS
150.0000 mg | ORAL_CAPSULE | Freq: Every day | ORAL | 0 refills | Status: DC
  Filled 2021-01-08 – 2021-01-09 (×2): qty 6, 2d supply, fill #0

## 2021-01-08 MED ORDER — LIDOCAINE-PRILOCAINE 2.5-2.5 % EX CREA
1.0000 "application " | TOPICAL_CREAM | CUTANEOUS | 0 refills | Status: DC | PRN
  Filled 2021-01-08: qty 30, 30d supply, fill #0

## 2021-01-08 MED ORDER — ONDANSETRON HCL 8 MG PO TABS
4.0000 mg | ORAL_TABLET | Freq: Three times a day (TID) | ORAL | 0 refills | Status: DC | PRN
  Filled 2021-01-08: qty 20, 14d supply, fill #0

## 2021-01-08 NOTE — Addendum Note (Signed)
Addended by: Lenox Ponds E on: 01/08/2021 04:17 PM   Modules accepted: Orders

## 2021-01-08 NOTE — Progress Notes (Signed)
[facility] OFFICE PROGRESS NOTE   Diagnosis: Non-Hodgkin's lymphoma  INTERVAL HISTORY:   Mr. Maffeo returns as scheduled.  He is here today with his wife and daughter.  No recurrent rectal bleeding.  No complaint.  No fever, anorexia, or night sweats.  Objective:  Vital signs in last 24 hours:  Blood pressure (!) 138/91, pulse 86, temperature 97.8 F (36.6 C), temperature source Oral, resp. rate 18, height 5\' 8"  (1.727 m), weight 169 lb 1.6 oz (76.7 kg), SpO2 98 %.   Lymphatics: No cervical, supraclavicular, axillary, or inguinal nodes Resp: Lungs clear bilaterally Cardio: Regular rate and rhythm GI: No hepatosplenomegaly, no mass, nontender Vascular: No leg edema Neuro: Alert and oriented   Lab Results:  Lab Results  Component Value Date   WBC 8.7 01/08/2021   HGB 12.9 (L) 01/08/2021   HCT 42.1 01/08/2021   MCV 79.9 (L) 01/08/2021   PLT 121 (L) 01/08/2021   NEUTROABS 6.8 01/08/2021    CMP  Lab Results  Component Value Date   NA 139 10/03/2020   K 4.9 10/03/2020   CL 104 10/03/2020   CO2 19 (L) 10/03/2020   GLUCOSE 93 10/03/2020   BUN 22 10/03/2020   CREATININE 1.20 11/18/2020   CALCIUM 8.8 10/03/2020   PROT 6.7 11/20/2020   ALBUMIN 4.1 11/20/2020   AST 14 11/20/2020   ALT 10 11/20/2020   ALKPHOS 68 11/20/2020   BILITOT 0.4 11/20/2020   GFRNONAA >60 09/17/2020   GFRAA 59 (L) 03/26/2020       Imaging: PET images from 01/01/2021 reviewed with Mr. Sole and his family   Medications: I have reviewed the patient's current medications.   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 CT abdomen/pelvis 11/18/2020-2.4 x 2.3 segment 7 lesion, multiple additional subcentimeter lesions, some but not all present in 2016 CT chest 11/23/2020-11 mm right retrocrural node, asbestosis related pleural disease CT-guided biopsy of the left periaortic lymph node 12/12/2020-large B-cell lymphoma, CD5 positive, CD20  positive, FISH panel-BCL6 gene rearrangement positive, MYC negative PET 01/01/2021-hypermetabolic left supraclavicular and left upper mediastinal nodes, bulky hypermetabolic periaortic retroperitoneal nodes, single hypermetabolic metastasis in the right liver, multiple hypermetabolic spleen lesions Iron deficiency anemia Coronary artery disease Peripheral vascular disease CHF Hypertension Carotid artery disease CVA Peripheral neuropathy    Disposition: Mr. Briseno has been diagnosed with advanced stage large B-cell lymphoma.  He is asymptomatic.  The molecular panel returned and he does not appear to have a "double hit "lymphoma.  I discussed treatment options with Mr. Potash and his family.  He has multiple comorbid conditions including congestive heart failure.  He has been admitted within the past year due to CHF.  He also has neuropathy in the hands and feet.  This affects his ability to ambulate.  I Recommend Treatment with CEOP-rituximab.  He is not a candidate for treatment with doxorubicin and vincristine will be held secondary to neuropathy.  I reviewed potential toxicities associated with the Cytoxan, etoposide, and prednisone regimen.  He understands the potential for nausea, mucositis, alopecia, and hematologic toxicity.  We discussed the chance of secondary leukemia.  We reviewed the allergic reaction, atypical infections, CNS toxicity, reactivation of hepatitis, and pneumonitis associated with rituximab.  He agrees to proceed.  He will attend a chemotherapy teaching class.  Mr. Raineri will be referred for Port-A-Cath placement with the plan to initiate systemic therapy on 01/14/2021.  He will return for a nadir CBC on 01/22/2021.  A chemotherapy plan was entered  today.   Betsy Coder, MD  01/08/2021  8:29 AM

## 2021-01-08 NOTE — Progress Notes (Signed)
Met with Mr Derousse and his wife and daughter [name], we discussed starting treatment of CEP Rituxan on 10/18. Will PAC placed on Friday 10/14 at [facility] at 10:00, instructions for NPO after midnight, Driver and someone to stay overnight with him reviewed.  Prescriptions for EMLA, Compazine and Ondansetron called into [facility] outpatient pharmacy. Patient will also take Prednisone at home on Days 2-5 of each cycle and ETOPOSIDE at home on Days 2&3 per Dr Benay Spice.  Gave family contact information as well

## 2021-01-08 NOTE — Progress Notes (Signed)
START ON PATHWAY REGIMEN - Lymphoma and CLL     A cycle is every 21 days:     Prednisone      Rituximab-xxxx      Cyclophosphamide      Etoposide      Vincristine      Etoposide   **Always confirm dose/schedule in your pharmacy ordering system**  Patient Characteristics: Diffuse Large B-Cell Lymphoma or Follicular Lymphoma, Grade 3B, First Line, Stage III and IV Disease Type: Not Applicable Disease Type: Diffuse Large B-Cell Lymphoma Disease Type: Not Applicable Line of therapy: First Line Intent of Therapy: Curative Intent, Discussed with Patient

## 2021-01-08 NOTE — Progress Notes (Signed)
Pt to begin mini R, PORT ordered and will be done at [facility] IR on Friday 1014

## 2021-01-09 ENCOUNTER — Other Ambulatory Visit (HOSPITAL_COMMUNITY): Payer: Self-pay

## 2021-01-09 ENCOUNTER — Other Ambulatory Visit (HOSPITAL_COMMUNITY): Payer: Self-pay | Admitting: Physician Assistant

## 2021-01-09 ENCOUNTER — Other Ambulatory Visit (HOSPITAL_BASED_OUTPATIENT_CLINIC_OR_DEPARTMENT_OTHER): Payer: Self-pay

## 2021-01-09 ENCOUNTER — Telehealth: Payer: Self-pay | Admitting: Pharmacist

## 2021-01-09 ENCOUNTER — Other Ambulatory Visit: Payer: Self-pay | Admitting: Radiology

## 2021-01-09 NOTE — Progress Notes (Signed)
Pharmacist Chemotherapy Monitoring - Initial Assessment    Anticipated start date: 01/14/21   The following has been reviewed per standard work regarding the patient's treatment regimen: The patient's diagnosis, treatment plan and drug doses, and organ/hematologic function Lab orders and baseline tests specific to treatment regimen  The treatment plan start date, drug sequencing, and pre-medications Prior authorization status  Patient's documented medication list, including drug-drug interaction screen and prescriptions for anti-emetics and supportive care specific to the treatment regimen The drug concentrations, fluid compatibility, administration routes, and timing of the medications to be used The patient's access for treatment and lifetime cumulative dose history, if applicable  The patient's medication allergies and previous infusion related reactions, if applicable   Changes made to treatment plan:  N/A  Follow up needed:  N/A   Philomena Course, [place], 01/09/2021  3:28 PM

## 2021-01-09 NOTE — Telephone Encounter (Signed)
Oral Oncology Pharmacist Encounter  Received new prescription for etoposide PO for the treatment of advanced stage large B-cell lymphoma in conjunction with cyclophosamide and rituxamab, planned duration until disease control or unacceptable drug toxicity. He will receive IV etoposide on treatment day 1 then take PO etoposide on days 2 and 3.  CMP from 01/08/21 assessed, no relevant lab abnormalities. Patient has a CrCl of ~47 mL/min his dose has been reduced appropriately.  Prescription dose and frequency assessed.   Contacted office Manuela Schwartz, RN) about prednisone prescription.   Current medication list in Epic reviewed, one DDIs with etoposide identified: Carvedilol: carvedilol may increase the serum concentration of etoposide. Monitor for increased etoposide toxicities. No baseline dose adjustment needed.  Evaluated chart and no patient barriers to medication adherence identified.   Prescription has been e-scribed to the [facility] for benefits analysis and approval.  Oral Oncology Clinic will continue to follow for insurance authorization, copayment issues, initial counseling and start date.  Patient agreed to treatment on 01/08/21 per MD documentation.  Darl Pikes, PharmD, BCPS, BCOP, CPP Hematology/Oncology Clinical Pharmacist Practitioner [facility] [phone]  01/09/2021 1:13 PM

## 2021-01-10 ENCOUNTER — Ambulatory Visit (HOSPITAL_COMMUNITY)
Admission: RE | Admit: 2021-01-10 | Discharge: 2021-01-10 | Disposition: A | Discharge status: Home / Self Care | Payer: PPO | Source: Ambulatory Visit | Attending: Oncology | Admitting: Oncology

## 2021-01-10 ENCOUNTER — Other Ambulatory Visit: Payer: Self-pay

## 2021-01-10 ENCOUNTER — Telehealth: Payer: Self-pay | Admitting: Pharmacy Technician

## 2021-01-10 ENCOUNTER — Other Ambulatory Visit (HOSPITAL_COMMUNITY): Payer: Self-pay

## 2021-01-10 DIAGNOSIS — Z955 Presence of coronary angioplasty implant and graft: Secondary | ICD-10-CM | POA: Insufficient documentation

## 2021-01-10 DIAGNOSIS — I739 Peripheral vascular disease, unspecified: Secondary | ICD-10-CM | POA: Diagnosis not present

## 2021-01-10 DIAGNOSIS — C8510 Unspecified B-cell lymphoma, unspecified site: Secondary | ICD-10-CM | POA: Insufficient documentation

## 2021-01-10 DIAGNOSIS — I2510 Atherosclerotic heart disease of native coronary artery without angina pectoris: Secondary | ICD-10-CM | POA: Insufficient documentation

## 2021-01-10 DIAGNOSIS — Z452 Encounter for adjustment and management of vascular access device: Secondary | ICD-10-CM | POA: Diagnosis not present

## 2021-01-10 DIAGNOSIS — I509 Heart failure, unspecified: Secondary | ICD-10-CM | POA: Diagnosis not present

## 2021-01-10 DIAGNOSIS — I110 Hypertensive heart disease with heart failure: Secondary | ICD-10-CM | POA: Insufficient documentation

## 2021-01-10 DIAGNOSIS — Z79899 Other long term (current) drug therapy: Secondary | ICD-10-CM | POA: Diagnosis not present

## 2021-01-10 DIAGNOSIS — Z888 Allergy status to other drugs, medicaments and biological substances status: Secondary | ICD-10-CM | POA: Insufficient documentation

## 2021-01-10 DIAGNOSIS — E785 Hyperlipidemia, unspecified: Secondary | ICD-10-CM | POA: Insufficient documentation

## 2021-01-10 DIAGNOSIS — C8330 Diffuse large B-cell lymphoma, unspecified site: Secondary | ICD-10-CM | POA: Diagnosis not present

## 2021-01-10 DIAGNOSIS — Z887 Allergy status to serum and vaccine status: Secondary | ICD-10-CM | POA: Insufficient documentation

## 2021-01-10 DIAGNOSIS — Z7982 Long term (current) use of aspirin: Secondary | ICD-10-CM | POA: Diagnosis not present

## 2021-01-10 DIAGNOSIS — K219 Gastro-esophageal reflux disease without esophagitis: Secondary | ICD-10-CM | POA: Diagnosis not present

## 2021-01-10 HISTORY — PX: IR IMAGING GUIDED PORT INSERTION: IMG5740

## 2021-01-10 MED ORDER — MIDAZOLAM HCL 2 MG/2ML IJ SOLN
INTRAMUSCULAR | Status: DC | PRN
  Administered 2021-01-10 (×2): 1 mg via INTRAVENOUS

## 2021-01-10 MED ORDER — LIDOCAINE HCL (PF) 1 % IJ SOLN
INTRAMUSCULAR | Status: DC | PRN
  Administered 2021-01-10: 20 mL

## 2021-01-10 MED ORDER — SODIUM CHLORIDE 0.9 % IV SOLN: INTRAVENOUS | Status: DC | PRN

## 2021-01-10 MED ORDER — LIDOCAINE-EPINEPHRINE 1 %-1:100000 IJ SOLN
INTRAMUSCULAR | Status: AC
  Administered 2021-01-10: 10 mL
  Filled 2021-01-10: qty 1

## 2021-01-10 MED ORDER — MIDAZOLAM HCL 2 MG/2ML IJ SOLN
INTRAMUSCULAR | Status: AC
  Filled 2021-01-10: qty 2

## 2021-01-10 MED ORDER — HEPARIN SOD (PORK) LOCK FLUSH 100 UNIT/ML IV SOLN
INTRAVENOUS | Status: AC
  Administered 2021-01-10: 500 [IU]
  Filled 2021-01-10: qty 5

## 2021-01-10 MED ORDER — FENTANYL CITRATE (PF) 100 MCG/2ML IJ SOLN
INTRAMUSCULAR | Status: DC | PRN
  Administered 2021-01-10 (×2): 50 ug via INTRAVENOUS

## 2021-01-10 MED ORDER — FENTANYL CITRATE (PF) 100 MCG/2ML IJ SOLN
INTRAMUSCULAR | Status: AC
  Filled 2021-01-10: qty 2

## 2021-01-10 NOTE — Telephone Encounter (Signed)
Oral Oncology Patient Advocate Encounter  After completing a benefits investigation, prior authorization for Etoposide is not required at this time through [employer] ([employer]  Patient's copay is $97.95.  [place] Patient [place] Phone [phone] Fax [phone] 01/10/2021 10:03 AM

## 2021-01-10 NOTE — Progress Notes (Signed)
NS bolus started d/t BP

## 2021-01-10 NOTE — Procedures (Signed)
Interventional Radiology Procedure Note  Procedure: Placement of a right IJ approach single lumen PowerPort.  Tip is positioned at the superior cavoatrial junction and catheter is ready for immediate use.  Complications: None Recommendations:  - Ok to shower tomorrow - Do not submerge for 7 days - Routine line care   Signed,  Elanah Osmanovic S. Henretter Piekarski, DO   
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
## 2021-01-10 NOTE — Progress Notes (Signed)
D/C instructions given to Th[place] Headings pt 's daughter.  Voiced understanding

## 2021-01-12 ENCOUNTER — Other Ambulatory Visit: Payer: Self-pay | Admitting: Oncology

## 2021-01-13 ENCOUNTER — Other Ambulatory Visit (HOSPITAL_COMMUNITY): Payer: Self-pay

## 2021-01-13 ENCOUNTER — Telehealth: Payer: Self-pay | Admitting: *Deleted

## 2021-01-13 DIAGNOSIS — C8580 Other specified types of non-Hodgkin lymphoma, unspecified site: Secondary | ICD-10-CM

## 2021-01-13 MED ORDER — PREDNISONE 20 MG PO TABS
60.0000 mg | ORAL_TABLET | Freq: Every day | ORAL | 4 refills | Status: DC
  Filled 2021-01-13: qty 12, 4d supply, fill #0
  Filled 2021-02-04: qty 12, 4d supply, fill #1

## 2021-01-13 NOTE — Telephone Encounter (Signed)
Informed wife of script for Prednisone 60 mg daily x 4 days beginning on day 2 of each chemotherapy cycle. Confirmed she requests it be sent to [facility] since daughter is picking up etoposide there today as well. Wife reports patient seems depressed and quiet and "not himself". He usually has good humor, but not now. He won't talk about things, but she agrees it would be helpful for her to do so. Placed referral to [facility] for wife for counseling.

## 2021-01-13 NOTE — Telephone Encounter (Signed)
Oral Oncology Patient Advocate Encounter  Alyson, oral chemo pharmacist, spoke with Mrs Cromwell this morning to schedule first fill of Etoposide from [facility].  Patients daughter will pick up 01/13/21.    [place] will call 7-10 days before next refill is due to complete adherence call and set up delivery of medication.     [place] Patient [place] Phone [phone] Fax [phone] 01/13/2021 10:59 AM

## 2021-01-13 NOTE — Telephone Encounter (Signed)
Oral Chemotherapy Pharmacist Encounter  Patient's daughter [name] [name] plans on picking up etoposide and prednisone from [place] this afternoon.  Patient Education I spoke with patient's wife [name] [name] for overview of new oral chemotherapy medication: etoposide PO for the treatment of advanced stage large B-cell lymphoma in conjunction with cyclophosamide and rituxamab, planned duration until disease control or unacceptable drug toxicity. He will receive IV etoposide on treatment day 1 then take PO etoposide on days 2 and 3.  [name] on administration, dosing, side effects, monitoring, drug-food interactions, safe handling, storage, and disposal. Patient will take  Etoposide po: Take 3 capsules (150 mg total) by mouth daily for 2 days. Prednisone: Take 3 tablets (60 mg total) by mouth daily with breakfast. Start on day 2 of each chemo cycle and take x 4 days  Side effects include but not limited to: N/V, decreased wbc/hgb/plt. Nausea: [name] [name] has picked up his ondansetron and prochlorperazine prescriptions. Reviewed the instructions for use of both medications.     Reviewed with [name] [name] importance of keeping a medication schedule and plan for any missed doses.  After discussion with [name] [name] no patient barriers to medication adherence identified.   [name] [name] voiced understanding and appreciation. All questions answered. Medication handout provided.  Provided [facility] with [facility] phone number. [name] [name] knows to call the office with questions or concerns. Oral Chemotherapy Navigation Clinic will continue to follow.  [name] [name], PharmD, BCPS, BCOP, CPP Hematology/Oncology Clinical Pharmacist Practitioner [facility] [phone]  01/13/2021 10:32 AM

## 2021-01-14 ENCOUNTER — Encounter: Payer: Self-pay | Admitting: *Deleted

## 2021-01-14 ENCOUNTER — Inpatient Hospital Stay: Payer: PPO

## 2021-01-14 ENCOUNTER — Other Ambulatory Visit: Payer: Self-pay

## 2021-01-14 VITALS — BP 140/87 | HR 71 | Temp 98.2°F | Resp 18 | Wt 171.0 lb

## 2021-01-14 DIAGNOSIS — C8580 Other specified types of non-Hodgkin lymphoma, unspecified site: Secondary | ICD-10-CM

## 2021-01-14 DIAGNOSIS — D509 Iron deficiency anemia, unspecified: Secondary | ICD-10-CM

## 2021-01-14 DIAGNOSIS — Z5112 Encounter for antineoplastic immunotherapy: Secondary | ICD-10-CM | POA: Diagnosis not present

## 2021-01-14 LAB — HEPATITIS B SURFACE ANTIGEN: Hepatitis B Surface Ag: NONREACTIVE

## 2021-01-14 LAB — HEPATITIS B CORE ANTIBODY, TOTAL: Hep B Core Total Ab: NONREACTIVE

## 2021-01-14 MED ORDER — SODIUM CHLORIDE 0.9 % IV SOLN
375.0000 mg/m2 | Freq: Once | INTRAVENOUS | Status: AC
  Administered 2021-01-14: 700 mg via INTRAVENOUS
  Filled 2021-01-14: qty 50

## 2021-01-14 MED ORDER — SODIUM CHLORIDE 0.9 % IV SOLN
10.0000 mg | Freq: Once | INTRAVENOUS | Status: AC
  Administered 2021-01-14: 10 mg via INTRAVENOUS
  Filled 2021-01-14: qty 1

## 2021-01-14 MED ORDER — ACETAMINOPHEN 325 MG PO TABS
650.0000 mg | ORAL_TABLET | Freq: Once | ORAL | Status: AC
  Administered 2021-01-14: 650 mg via ORAL
  Filled 2021-01-14: qty 2

## 2021-01-14 MED ORDER — SODIUM CHLORIDE 0.9 % IV SOLN
600.0000 mg/m2 | Freq: Once | INTRAVENOUS | Status: AC
  Administered 2021-01-14: 1160 mg via INTRAVENOUS
  Filled 2021-01-14: qty 58

## 2021-01-14 MED ORDER — DIPHENHYDRAMINE HCL 25 MG PO CAPS
50.0000 mg | ORAL_CAPSULE | Freq: Once | ORAL | Status: AC
  Administered 2021-01-14: 50 mg via ORAL
  Filled 2021-01-14: qty 2

## 2021-01-14 MED ORDER — SODIUM CHLORIDE 0.9 % IV SOLN
Freq: Once | INTRAVENOUS | Status: AC
Start: 2021-01-14 — End: 2021-01-14

## 2021-01-14 MED ORDER — SODIUM CHLORIDE 0.9% FLUSH
10.0000 mL | INTRAVENOUS | Status: DC | PRN
  Administered 2021-01-14: 10 mL

## 2021-01-14 MED ORDER — PALONOSETRON HCL INJECTION 0.25 MG/5ML
0.2500 mg | Freq: Once | INTRAVENOUS | Status: AC
  Administered 2021-01-14: 0.25 mg via INTRAVENOUS
  Filled 2021-01-14: qty 5

## 2021-01-14 MED ORDER — HEPARIN SOD (PORK) LOCK FLUSH 100 UNIT/ML IV SOLN
500.0000 [IU] | Freq: Once | INTRAVENOUS | Status: AC | PRN
  Administered 2021-01-14: 500 [IU]

## 2021-01-14 MED ORDER — SODIUM CHLORIDE 0.9 % IV SOLN
50.0000 mg/m2 | Freq: Once | INTRAVENOUS | Status: AC
  Administered 2021-01-14: 100 mg via INTRAVENOUS
  Filled 2021-01-14: qty 5

## 2021-01-14 NOTE — Progress Notes (Signed)
Patient presents for treatment. RN assessment completed along with the following:  Labs/vitals reviewed - Yes, and within treatment parameters.   Weight within 10% of previous measurement - Yes Oncology Treatment Attestation completed for current therapy- Yes, on date 01/08/21 Informed consent completed and reflects current therapy/intent - Yes, on date 01/14/21             Provider progress note reviewed - Patient not seen by provider today. Most recent note dated 01/08/21 reviewed. Treatment/Antibody/Supportive plan reviewed - Yes, and there are no adjustments needed for today's treatment. S&H and other orders reviewed - Yes, and there are no additional orders identified. Previous treatment date reviewed - Yes, and the appropriate amount of time has elapsed between treatments.  Patient to proceed with treatment.

## 2021-01-14 NOTE — Progress Notes (Signed)
PATIENT NAVIGATOR PROGRESS NOTE  Name: [name] [name] Date: 01/14/2021 MRN: [id number]  DOB: [birth year]   Reason for visit:  First treatment  Comments:  Met with [name] during first treatment, reviewed at home medications and gave him a chart on how to take them. Encouraged him to call with any issues or questions    Time spent counseling/coordinating care: 15-30 minutes

## 2021-01-14 NOTE — Patient Instructions (Signed)
Implanted Port Home Guide An implanted port is a device that is placed under the skin. It is usually placed in the chest. The device can be used to give IV medicine, to take blood, or for dialysis. You may have an implanted port if: You need IV medicine that would be irritating to the small veins in your hands or arms. You need IV medicines, such as antibiotics, for a long period of time. You need IV nutrition for a long period of time. You need dialysis. When you have a port, your health care provider can choose to use the port instead of veins in your arms for these procedures. You may have fewer limitations when using a port than you would if you used other types of long-term IVs, and you will likely be able to return to normal activities after your incision heals. An implanted port has two main parts: Reservoir. The reservoir is the part where a needle is inserted to give medicines or draw blood. The reservoir is round. After it is placed, it appears as a small, raised area under your skin. Catheter. The catheter is a thin, flexible tube that connects the reservoir to a vein. Medicine that is inserted into the reservoir goes into the catheter and then into the vein. How is my port accessed? To access your port: A numbing cream may be placed on the skin over the port site. Your health care provider will put on a mask and sterile gloves. The skin over your port will be cleaned carefully with a germ-killing soap and allowed to dry. Your health care provider will gently pinch the port and insert a needle into it. Your health care provider will check for a blood return to make sure the port is in the vein and is not clogged. If your port needs to remain accessed to get medicine continuously (constant infusion), your health care provider will place a clear bandage (dressing) over the needle site. The dressing and needle will need to be changed every week, or as told by your health care provider. What  is flushing? Flushing helps keep the port from getting clogged. Follow instructions from your health care provider about how and when to flush the port. Ports are usually flushed with saline solution or a medicine called heparin. The need for flushing will depend on how the port is used: If the port is only used from time to time to give medicines or draw blood, the port may need to be flushed: Before and after medicines have been given. Before and after blood has been drawn. As part of routine maintenance. Flushing may be recommended every 4-6 weeks. If a constant infusion is running, the port may not need to be flushed. Throw away any syringes in a disposal container that is meant for sharp items (sharps container). You can buy a sharps container from a pharmacy, or you can make one by using an empty hard plastic bottle with a cover. How long will my port stay implanted? The port can stay in for as long as your health care provider thinks it is needed. When it is time for the port to come out, a surgery will be done to remove it. The surgery will be similar to the procedure that was done to put the port in. Follow these instructions at home:  Flush your port as told by your health care provider. If you need an infusion over several days, follow instructions from your health care provider about how   to take care of your port site. Make sure you: Wash your hands with soap and water before you change your dressing. If soap and water are not available, use alcohol-based hand sanitizer. Change your dressing as told by your health care provider. Place any used dressings or infusion bags into a plastic bag. Throw that bag in the trash. Keep the dressing that covers the needle clean and dry. Do not get it wet. Do not use scissors or sharp objects near the tube. Keep the tube clamped, unless it is being used. Check your port site every day for signs of infection. Check for: Redness, swelling, or  pain. Fluid or blood. Pus or a bad smell. Protect the skin around the port site. Avoid wearing bra straps that rub or irritate the site. Protect the skin around your port from seat belts. Place a soft pad over your chest if needed. Bathe or shower as told by your health care provider. The site may get wet as long as you are not actively receiving an infusion. Return to your normal activities as told by your health care provider. Ask your health care provider what activities are safe for you. Carry a medical alert card or wear a medical alert bracelet at all times. This will let health care providers know that you have an implanted port in case of an emergency. Get help right away if: You have redness, swelling, or pain at the port site. You have fluid or blood coming from your port site. You have pus or a bad smell coming from the port site. You have a fever. Summary Implanted ports are usually placed in the chest for long-term IV access. Follow instructions from your health care provider about flushing the port and changing bandages (dressings). Take care of the area around your port by avoiding clothing that puts pressure on the area, and by watching for signs of infection. Protect the skin around your port from seat belts. Place a soft pad over your chest if needed. Get help right away if you have a fever or you have redness, swelling, pain, drainage, or a bad smell at the port site. This information is not intended to replace advice given to you by your health care provider. Make sure you discuss any questions you have with your health care provider. Document Revised: 06/05/2020 Document Reviewed: 07/31/2019 [employer] Patient Education  2022 [employer]  

## 2021-01-14 NOTE — Patient Instructions (Signed)
[facility]  Discharge Instructions: Thank you for choosing [facility] to provide your oncology and hematology care.   If you have a lab appointment with the [facility], please go directly to the [facility] and check in at the registration area.   Wear comfortable clothing and clothing appropriate for easy access to any Portacath or PICC line.   We strive to give you quality time with your provider. You may need to reschedule your appointment if you arrive late (15 or more minutes).  Arriving late affects you and other patients whose appointments are after yours.  Also, if you miss three or more appointments without notifying the office, you may be dismissed from the clinic at the provider's discretion.      For prescription refill requests, have your pharmacy contact our office and allow 72 hours for refills to be completed.    Today you received the following chemotherapy and/or immunotherapy agents: cyclophosphamide, etoposide, rituximab      To help prevent nausea and vomiting after your treatment, we encourage you to take your nausea medication as directed.  BELOW ARE SYMPTOMS THAT SHOULD BE REPORTED IMMEDIATELY: *FEVER GREATER THAN 100.4 F (38 C) OR HIGHER *CHILLS OR SWEATING *NAUSEA AND VOMITING THAT IS NOT CONTROLLED WITH YOUR NAUSEA MEDICATION *UNUSUAL SHORTNESS OF BREATH *UNUSUAL BRUISING OR BLEEDING *URINARY PROBLEMS (pain or burning when urinating, or frequent urination) *BOWEL PROBLEMS (unusual diarrhea, constipation, pain near the anus) TENDERNESS IN MOUTH AND THROAT WITH OR WITHOUT PRESENCE OF ULCERS (sore throat, sores in mouth, or a toothache) UNUSUAL RASH, SWELLING OR PAIN  UNUSUAL VAGINAL DISCHARGE OR ITCHING   Items with * indicate a potential emergency and should be followed up as soon as possible or go to the Emergency Department if any problems should occur.  Please show the CHEMOTHERAPY ALERT CARD or IMMUNOTHERAPY  ALERT CARD at check-in to the Emergency Department and triage nurse.  Should you have questions after your visit or need to cancel or reschedule your appointment, please contact [facility]  Dept: [phone]  and follow the prompts.  Office hours are 8:00 a.m. to 4:30 p.m. Monday - Friday. Please note that voicemails left after 4:00 p.m. may not be returned until the following business day.  We are closed weekends and major holidays. You have access to a nurse at all times for urgent questions. Please call the main number to the clinic Dept: [phone] and follow the prompts.   For any non-urgent questions, you may also contact your provider using MyChart. We now offer e-Visits for anyone 34 and older to request care online for non-urgent symptoms. For details visit [email or web address].   Also download the MyChart app! Go to the app store, search "MyChart", open the app, select [facility], and log in with your MyChart username and password.  Due to Covid, a mask is required upon entering the hospital/clinic. If you do not have a mask, one will be given to you upon arrival. For doctor visits, patients may have 1 support person aged 12 or older with them. For treatment visits, patients cannot have anyone with them due to current Covid guidelines and our immunocompromised population.   Cyclophosphamide Injection What is this medication? CYCLOPHOSPHAMIDE (sye kloe FOSS fa mide) is a chemotherapy drug. It slows the growth of cancer cells. This medicine is used to treat many types of cancer like lymphoma, myeloma, leukemia, breast cancer, and ovarian cancer, to name a few. This  medicine may be used for other purposes; ask your health care provider or pharmacist if you have questions. COMMON BRAND NAME(S): Cytoxan, Neosar What should I tell my care team before I take this medication? They need to know if you have any of these conditions: heart disease history of  irregular heartbeat infection kidney disease liver disease low blood counts, like white cells, platelets, or red blood cells on hemodialysis recent or ongoing radiation therapy scarring or thickening of the lungs trouble passing urine an unusual or allergic reaction to cyclophosphamide, other medicines, foods, dyes, or preservatives pregnant or trying to get pregnant breast-feeding How should I use this medication? This drug is usually given as an injection into a vein or muscle or by infusion into a vein. It is administered in a hospital or clinic by a specially trained health care professional. Talk to your pediatrician regarding the use of this medicine in children. Special care may be needed. Overdosage: If you think you have taken too much of this medicine contact a poison control center or emergency room at once. NOTE: This medicine is only for you. Do not share this medicine with others. What if I miss a dose? It is important not to miss your dose. Call your doctor or health care professional if you are unable to keep an appointment. What may interact with this medication? amphotericin B azathioprine certain antivirals for HIV or hepatitis certain medicines for blood pressure, heart disease, irregular heart beat certain medicines that treat or prevent blood clots like warfarin certain other medicines for cancer cyclosporine etanercept indomethacin medicines that relax muscles for surgery medicines to increase blood counts metronidazole This list may not describe all possible interactions. Give your health care provider a list of all the medicines, herbs, non-prescription drugs, or dietary supplements you use. Also tell them if you smoke, drink alcohol, or use illegal drugs. Some items may interact with your medicine. What should I watch for while using this medication? Your condition will be monitored carefully while you are receiving this medicine. You may need blood work  done while you are taking this medicine. Drink water or other fluids as directed. Urinate often, even at night. Some products may contain alcohol. Ask your health care professional if this medicine contains alcohol. Be sure to tell all health care professionals you are taking this medicine. Certain medicines, like metronidazole and disulfiram, can cause an unpleasant reaction when taken with alcohol. The reaction includes flushing, headache, nausea, vomiting, sweating, and increased thirst. The reaction can last from 30 minutes to several hours. Do not become pregnant while taking this medicine or for 1 year after stopping it. Women should inform their health care professional if they wish to become pregnant or think they might be pregnant. Men should not father a child while taking this medicine and for 4 months after stopping it. There is potential for serious side effects to an unborn child. Talk to your health care professional for more information. Do not breast-feed an infant while taking this medicine or for 1 week after stopping it. This medicine has caused ovarian failure in some women. This medicine may make it more difficult to get pregnant. Talk to your health care professional if you are concerned about your fertility. This medicine has caused decreased sperm counts in some men. This may make it more difficult to father a child. Talk to your health care professional if you are concerned about your fertility. Call your health care professional for advice if you get  a fever, chills, or sore throat, or other symptoms of a cold or flu. Do not treat yourself. This medicine decreases your body's ability to fight infections. Try to avoid being around people who are sick. Avoid taking medicines that contain aspirin, acetaminophen, ibuprofen, naproxen, or ketoprofen unless instructed by your health care professional. These medicines may hide a fever. Talk to your health care professional about your risk  of cancer. You may be more at risk for certain types of cancer if you take this medicine. If you are going to need surgery or other procedure, tell your health care professional that you are using this medicine. Be careful brushing or flossing your teeth or using a toothpick because you may get an infection or bleed more easily. If you have any dental work done, tell your dentist you are receiving this medicine. What side effects may I notice from receiving this medication? Side effects that you should report to your doctor or health care professional as soon as possible: allergic reactions like skin rash, itching or hives, swelling of the face, lips, or tongue breathing problems nausea, vomiting signs and symptoms of bleeding such as bloody or black, tarry stools; red or dark brown urine; spitting up blood or brown material that looks like coffee grounds; red spots on the skin; unusual bruising or bleeding from the eyes, gums, or nose signs and symptoms of heart failure like fast, irregular heartbeat, sudden weight gain; swelling of the ankles, feet, hands signs and symptoms of infection like fever; chills; cough; sore throat; pain or trouble passing urine signs and symptoms of kidney injury like trouble passing urine or change in the amount of urine signs and symptoms of liver injury like dark yellow or brown urine; general ill feeling or flu-like symptoms; light-colored stools; loss of appetite; nausea; right upper belly pain; unusually weak or tired; yellowing of the eyes or skin Side effects that usually do not require medical attention (report to your doctor or health care professional if they continue or are bothersome): confusion decreased hearing diarrhea facial flushing hair loss headache loss of appetite missed menstrual periods signs and symptoms of low red blood cells or anemia such as unusually weak or tired; feeling faint or lightheaded; falls skin discoloration This list may  not describe all possible side effects. Call your doctor for medical advice about side effects. You may report side effects to FDA at 1-800-FDA-1088. Where should I keep my medication? This drug is given in a hospital or clinic and will not be stored at home. NOTE: This sheet is a summary. It may not cover all possible information. If you have questions about this medicine, talk to your doctor, pharmacist, or health care provider.  2022 Elsevier/Gold Standard (2018-12-19 09:53:29)  Etoposide, VP-16 injection What is this medication? ETOPOSIDE, VP-16 (e toe POE side) is a chemotherapy drug. It is used to treat testicular cancer, lung cancer, and other cancers. This medicine may be used for other purposes; ask your health care provider or pharmacist if you have questions. COMMON BRAND NAME(S): Etopophos, Toposar, VePesid What should I tell my care team before I take this medication? They need to know if you have any of these conditions: infection kidney disease liver disease low blood counts, like low white cell, platelet, or red cell counts an unusual or allergic reaction to etoposide, other medicines, foods, dyes, or preservatives pregnant or trying to get pregnant breast-feeding How should I use this medication? This medicine is for infusion into a vein. It  is administered in a hospital or clinic by a specially trained health care professional. Talk to your pediatrician regarding the use of this medicine in children. Special care may be needed. Overdosage: If you think you have taken too much of this medicine contact a poison control center or emergency room at once. NOTE: This medicine is only for you. Do not share this medicine with others. What if I miss a dose? It is important not to miss your dose. Call your doctor or health care professional if you are unable to keep an appointment. What may interact with this medication? This medicine may interact with the following  medications: warfarin This list may not describe all possible interactions. Give your health care provider a list of all the medicines, herbs, non-prescription drugs, or dietary supplements you use. Also tell them if you smoke, drink alcohol, or use illegal drugs. Some items may interact with your medicine. What should I watch for while using this medication? Visit your doctor for checks on your progress. This drug may make you feel generally unwell. This is not uncommon, as chemotherapy can affect healthy cells as well as cancer cells. Report any side effects. Continue your course of treatment even though you feel ill unless your doctor tells you to stop. In some cases, you may be given additional medicines to help with side effects. Follow all directions for their use. Call your doctor or health care professional for advice if you get a fever, chills or sore throat, or other symptoms of a cold or flu. Do not treat yourself. This drug decreases your body's ability to fight infections. Try to avoid being around people who are sick. This medicine may increase your risk to bruise or bleed. Call your doctor or health care professional if you notice any unusual bleeding. Talk to your doctor about your risk of cancer. You may be more at risk for certain types of cancers if you take this medicine. Do not become pregnant while taking this medicine or for at least 6 months after stopping it. Women should inform their doctor if they wish to become pregnant or think they might be pregnant. Women of child-bearing potential will need to have a negative pregnancy test before starting this medicine. There is a potential for serious side effects to an unborn child. Talk to your health care professional or pharmacist for more information. Do not breast-feed an infant while taking this medicine. Men must use a latex condom during sexual contact with a woman while taking this medicine and for at least 4 months after  stopping it. A latex condom is needed even if you have had a vasectomy. Contact your doctor right away if your partner becomes pregnant. Do not donate sperm while taking this medicine and for at least 4 months after you stop taking this medicine. Men should inform their doctors if they wish to father a child. This medicine may lower sperm counts. What side effects may I notice from receiving this medication? Side effects that you should report to your doctor or health care professional as soon as possible: allergic reactions like skin rash, itching or hives, swelling of the face, lips, or tongue low blood counts - this medicine may decrease the number of white blood cells, red blood cells, and platelets. You may be at increased risk for infections and bleeding nausea, vomiting redness, blistering, peeling or loosening of the skin, including inside the mouth signs and symptoms of infection like fever; chills; cough; sore throat; pain  or trouble passing urine signs and symptoms of low red blood cells or anemia such as unusually weak or tired; feeling faint or lightheaded; falls; breathing problems unusual bruising or bleeding Side effects that usually do not require medical attention (report to your doctor or health care professional if they continue or are bothersome): changes in taste diarrhea hair loss loss of appetite mouth sores This list may not describe all possible side effects. Call your doctor for medical advice about side effects. You may report side effects to FDA at 1-800-FDA-1088. Where should I keep my medication? This drug is given in a hospital or clinic and will not be stored at home. NOTE: This sheet is a summary. It may not cover all possible information. If you have questions about this medicine, talk to your doctor, pharmacist, or health care provider.  2022 Elsevier/Gold Standard (2018-05-11 16:57:15)  Rituximab Injection What is this medication? RITUXIMAB (ri TUX i mab)  is a monoclonal antibody. It is used to treat certain types of cancer like non-Hodgkin lymphoma and chronic lymphocytic leukemia. It is also used to treat rheumatoid arthritis, granulomatosis with polyangiitis, microscopic polyangiitis, and pemphigus vulgaris. This medicine may be used for other purposes; ask your health care provider or pharmacist if you have questions. COMMON BRAND NAME(S): RIABNI, Rituxan, RUXIENCE What should I tell my care team before I take this medication? They need to know if you have any of these conditions: chest pain heart disease infection especially a viral infection such as chickenpox, cold sores, hepatitis B, or herpes immune system problems irregular heartbeat or rhythm kidney disease low blood counts (white cells, platelets, or red cells) lung disease recent or upcoming vaccine an unusual or allergic reaction to rituximab, other medicines, foods, dyes, or preservatives pregnant or trying to get pregnant breast-feeding How should I use this medication? This medicine is injected into a vein. It is given by a health care provider in a hospital or clinic setting. A special MedGuide will be given to you before each treatment. Be sure to read this information carefully each time. Talk to your health care provider about the use of this medicine in children. While this drug may be prescribed for children as young as 6 months for selected conditions, precautions do apply. Overdosage: If you think you have taken too much of this medicine contact a poison control center or emergency room at once. NOTE: This medicine is only for you. Do not share this medicine with others. What if I miss a dose? Keep appointments for follow-up doses. It is important not to miss your dose. Call your health care provider if you are unable to keep an appointment. What may interact with this medication? Do not take this medicine with any of the following medicines: live vaccines This  medicine may also interact with the following medicines: cisplatin This list may not describe all possible interactions. Give your health care provider a list of all the medicines, herbs, non-prescription drugs, or dietary supplements you use. Also tell them if you smoke, drink alcohol, or use illegal drugs. Some items may interact with your medicine. What should I watch for while using this medication? Your condition will be monitored carefully while you are receiving this medicine. You may need blood work done while you are taking this medicine. This medicine can cause serious infusion reactions. To reduce the risk your health care provider may give you other medicines to take before receiving this one. Be sure to follow the directions from your health  care provider. This medicine may increase your risk of getting an infection. Call your health care provider for advice if you get a fever, chills, sore throat, or other symptoms of a cold or flu. Do not treat yourself. Try to avoid being around people who are sick. Call your health care provider if you are around anyone with measles, chickenpox, or if you develop sores or blisters that do not heal properly. Avoid taking medicines that contain aspirin, acetaminophen, ibuprofen, naproxen, or ketoprofen unless instructed by your health care provider. These medicines may hide a fever. This medicine may cause serious skin reactions. They can happen weeks to months after starting the medicine. Contact your health care provider right away if you notice fevers or flu-like symptoms with a rash. The rash may be red or purple and then turn into blisters or peeling of the skin. Or, you might notice a red rash with swelling of the face, lips or lymph nodes in your neck or under your arms. In some patients, this medicine may cause a serious brain infection that may cause death. If you have any problems seeing, thinking, speaking, walking, or standing, tell your  healthcare professional right away. If you cannot reach your healthcare professional, urgently seek other source of medical care. Do not become pregnant while taking this medicine or for at least 12 months after stopping it. Women should inform their health care provider if they wish to become pregnant or think they might be pregnant. There is potential for serious harm to an unborn child. Talk to your health care provider for more information. Women should use a reliable form of birth control while taking this medicine and for 12 months after stopping it. Do not breast-feed while taking this medicine or for at least 6 months after stopping it. What side effects may I notice from receiving this medication? Side effects that you should report to your health care provider as soon as possible: allergic reactions (skin rash, itching or hives; swelling of the face, lips, or tongue) diarrhea edema (sudden weight gain; swelling of the ankles, feet, hands or other unusual swelling; trouble breathing) fast, irregular heartbeat heart attack (trouble breathing; pain or tightness in the chest, neck, back or arms; unusually weak or tired) infection (fever, chills, cough, sore throat, pain or trouble passing urine) kidney injury (trouble passing urine or change in the amount of urine) liver injury (dark yellow or brown urine; general ill feeling or flu-like symptoms; loss of appetite, right upper belly pain; unusually weak or tired, yellowing of the eyes or skin) low blood pressure (dizziness; feeling faint or lightheaded, falls; unusually weak or tired) low red blood cell counts (trouble breathing; feeling faint; lightheaded, falls; unusually weak or tired) mouth sores redness, blistering, peeling, or loosening of the skin, including inside the mouth stomach pain unusual bruising or bleeding wheezing (trouble breathing with loud or whistling sounds) vomiting Side effects that usually do not require medical  attention (report to your health care provider if they continue or are bothersome): headache joint pain muscle cramps, pain nausea This list may not describe all possible side effects. Call your doctor for medical advice about side effects. You may report side effects to FDA at 1-800-FDA-1088. Where should I keep my medication? This medicine is given in a hospital or clinic. It will not be stored at home. NOTE: This sheet is a summary. It may not cover all possible information. If you have questions about this medicine, talk to your doctor, pharmacist, or  health care provider.  2022 Elsevier/Gold Standard (2020-03-07 15:47:26)

## 2021-01-15 ENCOUNTER — Other Ambulatory Visit (HOSPITAL_BASED_OUTPATIENT_CLINIC_OR_DEPARTMENT_OTHER): Payer: PPO

## 2021-01-15 ENCOUNTER — Encounter: Payer: Self-pay | Admitting: *Deleted

## 2021-01-15 NOTE — Progress Notes (Signed)
Spoke with patient's wife, patient tolerated treatment well, slept well, eating and drinking well this am. Taking medications appropriately. Encouraged to call with any issues or questions

## 2021-01-16 ENCOUNTER — Encounter: Payer: Self-pay | Admitting: General Practice

## 2021-01-16 NOTE — Progress Notes (Signed)
[facility] CSW Progress Notes  Call to check in with patient and wife.  Reports he is doing quite well, has significant family support and "a bunch of friends."  Husband admits that he is the more calm member of the couple, wife tends to be more anxious.    Spoke w wife "I am handling it, but I don't handle things well, especially in the beginning."  His diagnosis of cancer is troubling for her - they have been married for 64 years and it is difficult to see him with this diagnosis.  Both of them have had multiple health challenges but continue to live independently.  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  She relies on her faith for stability, she is an active church member.  Has much support from family, pastor, friends.    Affirmed her use of faith resources for coping, Her sister in law is a great support and she feels better after talking with sister in Sports coach.  Will mail her information packet on [facility], encouraged her to reach out as needed for support/resources.  Edwyna Shell, LCSW Clinical Social Worker Phone:  [phone]

## 2021-01-22 ENCOUNTER — Inpatient Hospital Stay: Payer: PPO

## 2021-01-22 ENCOUNTER — Other Ambulatory Visit (HOSPITAL_COMMUNITY): Payer: Self-pay

## 2021-01-22 ENCOUNTER — Other Ambulatory Visit: Payer: Self-pay

## 2021-01-22 VITALS — BP 126/81 | HR 79 | Temp 98.7°F | Resp 18

## 2021-01-22 DIAGNOSIS — Z95828 Presence of other vascular implants and grafts: Secondary | ICD-10-CM

## 2021-01-22 DIAGNOSIS — Z5112 Encounter for antineoplastic immunotherapy: Secondary | ICD-10-CM | POA: Diagnosis not present

## 2021-01-22 DIAGNOSIS — C8580 Other specified types of non-Hodgkin lymphoma, unspecified site: Secondary | ICD-10-CM

## 2021-01-22 LAB — CBC WITH DIFFERENTIAL (CANCER CENTER ONLY)
Abs Immature Granulocytes: 0.06 10*3/uL (ref 0.00–0.07)
Basophils Absolute: 0 10*3/uL (ref 0.0–0.1)
Basophils Relative: 1 %
Eosinophils Absolute: 0.2 10*3/uL (ref 0.0–0.5)
Eosinophils Relative: 5 %
HCT: 38.3 % — ABNORMAL LOW (ref 39.0–52.0)
Hemoglobin: 11.8 g/dL — ABNORMAL LOW (ref 13.0–17.0)
Immature Granulocytes: 1 %
Lymphocytes Relative: 6 %
Lymphs Abs: 0.3 10*3/uL — ABNORMAL LOW (ref 0.7–4.0)
MCH: 24.8 pg — ABNORMAL LOW (ref 26.0–34.0)
MCHC: 30.8 g/dL (ref 30.0–36.0)
MCV: 80.5 fL (ref 80.0–100.0)
Monocytes Absolute: 0.2 10*3/uL (ref 0.1–1.0)
Monocytes Relative: 5 %
Neutro Abs: 3.5 10*3/uL (ref 1.7–7.7)
Neutrophils Relative %: 82 %
Platelet Count: 122 10*3/uL — ABNORMAL LOW (ref 150–400)
RBC: 4.76 MIL/uL (ref 4.22–5.81)
RDW: 19.1 % — ABNORMAL HIGH (ref 11.5–15.5)
WBC Count: 4.3 10*3/uL (ref 4.0–10.5)
nRBC: 0 % (ref 0.0–0.2)

## 2021-01-22 MED ORDER — HEPARIN SOD (PORK) LOCK FLUSH 100 UNIT/ML IV SOLN: 500.0000 [IU] | Freq: Once | INTRAVENOUS | Status: DC

## 2021-01-22 MED ORDER — SODIUM CHLORIDE 0.9% FLUSH: 10.0000 mL | Freq: Once | INTRAVENOUS | Status: DC

## 2021-01-26 ENCOUNTER — Telehealth: Payer: Self-pay | Admitting: Family Medicine

## 2021-01-26 NOTE — Telephone Encounter (Signed)
Nurses Patient's B12 level for October 12 blood draw came back at 214 which is slightly low. #1 see if he is taking any B12 supplement If he is not taking B12 supplement I recommend the following 1000 mcg B12 orally daily  If he is taking B12 supplement please let me know how much-

## 2021-01-27 NOTE — Telephone Encounter (Signed)
Pt wife contacted and verbalized understanding.   Pt wife states that pt did good with first chemo treatment. This week he is having depression. Pt not sleeping good due to depression. Pt taking treatment every 3 weeks. No pain, not feeling sick, just feeling down. Wont talk and just want to set in chair, looks off into space like he is in deep thought. No suicidal thoughts and no thoughts of harming himself or other. Pt wife states that they have been married 60+ years and she had not ever seen him like this. Pt usually laid back and calm but is now belligerent by saying "No, don't do that". Wife concerned and wanting to know if this is the chemo or what it could be. Please advise. Thank you.

## 2021-01-27 NOTE — Telephone Encounter (Signed)
I would recommend vitamin B12 1000 mcg daily, discuss further with oncology

## 2021-01-27 NOTE — Telephone Encounter (Signed)
Wife contacted. Pt is not currently taking B12 due to taking chemo. Per wife oncology has not said anything to patient regarding taking B12 supplements. Pt sees oncology for another treatment on 02/04/21 and may speak with pt regarding B12 supplements. Please advise. Thank you.

## 2021-01-28 NOTE — Telephone Encounter (Signed)
Patient should do a follow-up visit with me in November please After working with setting this up with him please also forward this message back to me so I can do a phone call to the family  Please let Pamala Hurry know we would like to see him in November and I will also touch base with them later in the week thank you

## 2021-01-29 NOTE — Telephone Encounter (Signed)
Spoke with wife Pamala Hurry. Wife states pt will not admit he is depressed and will not want to talk about himself. Barbara placed on schedule for 02/17/21 at 1:10 pm. Wife states patient is doing well this morning; he has ate breakfast and is now in the sunroom working a puzzle. Pt informed that provider will also touch base later this week with them. Pt wife verbalized understanding.

## 2021-02-01 ENCOUNTER — Other Ambulatory Visit: Payer: Self-pay | Admitting: Family Medicine

## 2021-02-02 ENCOUNTER — Other Ambulatory Visit: Payer: Self-pay | Admitting: Oncology

## 2021-02-03 ENCOUNTER — Telehealth: Payer: Self-pay

## 2021-02-03 ENCOUNTER — Other Ambulatory Visit: Payer: Self-pay

## 2021-02-03 ENCOUNTER — Encounter: Payer: Self-pay | Admitting: Oncology

## 2021-02-03 ENCOUNTER — Other Ambulatory Visit: Payer: Self-pay | Admitting: Family Medicine

## 2021-02-03 MED ORDER — CLOPIDOGREL BISULFATE 75 MG PO TABS: 75.0000 mg | ORAL_TABLET | Freq: Every day | ORAL | 0 refills | Status: DC

## 2021-02-03 MED ORDER — AMLODIPINE BESYLATE 10 MG PO TABS: 10.0000 mg | ORAL_TABLET | Freq: Every day | ORAL | 0 refills | Status: DC

## 2021-02-03 NOTE — Telephone Encounter (Signed)
Patient called needs refills on amlodipine and clopidogrel ph# [phone]

## 2021-02-03 NOTE — Telephone Encounter (Signed)
Spoke with patient's wife and sent prescriptions in to pharmacies requested.

## 2021-02-04 ENCOUNTER — Inpatient Hospital Stay: Payer: PPO | Attending: Oncology

## 2021-02-04 ENCOUNTER — Inpatient Hospital Stay: Payer: PPO

## 2021-02-04 ENCOUNTER — Inpatient Hospital Stay: Payer: PPO | Admitting: Oncology

## 2021-02-04 ENCOUNTER — Other Ambulatory Visit: Payer: Self-pay

## 2021-02-04 ENCOUNTER — Other Ambulatory Visit (HOSPITAL_COMMUNITY): Payer: Self-pay

## 2021-02-04 VITALS — BP 140/92 | HR 89 | Temp 98.1°F | Resp 18 | Ht 68.0 in | Wt 168.2 lb

## 2021-02-04 VITALS — BP 144/92 | HR 78 | Temp 97.8°F | Resp 18

## 2021-02-04 DIAGNOSIS — C8580 Other specified types of non-Hodgkin lymphoma, unspecified site: Secondary | ICD-10-CM | POA: Diagnosis not present

## 2021-02-04 DIAGNOSIS — C8518 Unspecified B-cell lymphoma, lymph nodes of multiple sites: Secondary | ICD-10-CM | POA: Insufficient documentation

## 2021-02-04 DIAGNOSIS — Z5111 Encounter for antineoplastic chemotherapy: Secondary | ICD-10-CM | POA: Insufficient documentation

## 2021-02-04 DIAGNOSIS — Z5112 Encounter for antineoplastic immunotherapy: Secondary | ICD-10-CM | POA: Insufficient documentation

## 2021-02-04 LAB — LACTATE DEHYDROGENASE: LDH: 169 U/L (ref 98–192)

## 2021-02-04 LAB — CBC WITH DIFFERENTIAL (CANCER CENTER ONLY)
Abs Immature Granulocytes: 0.02 10*3/uL (ref 0.00–0.07)
Basophils Absolute: 0.1 10*3/uL (ref 0.0–0.1)
Basophils Relative: 1 %
Eosinophils Absolute: 0.2 10*3/uL (ref 0.0–0.5)
Eosinophils Relative: 3 %
HCT: 41.9 % (ref 39.0–52.0)
Hemoglobin: 12.9 g/dL — ABNORMAL LOW (ref 13.0–17.0)
Immature Granulocytes: 0 %
Lymphocytes Relative: 11 %
Lymphs Abs: 0.6 10*3/uL — ABNORMAL LOW (ref 0.7–4.0)
MCH: 24.7 pg — ABNORMAL LOW (ref 26.0–34.0)
MCHC: 30.8 g/dL (ref 30.0–36.0)
MCV: 80.3 fL (ref 80.0–100.0)
Monocytes Absolute: 0.8 10*3/uL (ref 0.1–1.0)
Monocytes Relative: 14 %
Neutro Abs: 4.1 10*3/uL (ref 1.7–7.7)
Neutrophils Relative %: 71 %
Platelet Count: 181 10*3/uL (ref 150–400)
RBC: 5.22 MIL/uL (ref 4.22–5.81)
RDW: 19.4 % — ABNORMAL HIGH (ref 11.5–15.5)
WBC Count: 5.7 10*3/uL (ref 4.0–10.5)
nRBC: 0 % (ref 0.0–0.2)

## 2021-02-04 LAB — CMP (CANCER CENTER ONLY)
ALT: 11 U/L (ref 0–44)
AST: 13 U/L — ABNORMAL LOW (ref 15–41)
Albumin: 4.3 g/dL (ref 3.5–5.0)
Alkaline Phosphatase: 70 U/L (ref 38–126)
Anion gap: 8 (ref 5–15)
BUN: 34 mg/dL — ABNORMAL HIGH (ref 8–23)
CO2: 25 mmol/L (ref 22–32)
Calcium: 8.8 mg/dL — ABNORMAL LOW (ref 8.9–10.3)
Chloride: 105 mmol/L (ref 98–111)
Creatinine: 1.55 mg/dL — ABNORMAL HIGH (ref 0.61–1.24)
GFR, Estimated: 44 mL/min — ABNORMAL LOW (ref >60)
Glucose, Bld: 97 mg/dL (ref 70–99)
Potassium: 5.3 mmol/L — ABNORMAL HIGH (ref 3.5–5.1)
Sodium: 138 mmol/L (ref 135–145)
Total Bilirubin: 0.4 mg/dL (ref 0.3–1.2)
Total Protein: 6.7 g/dL (ref 6.5–8.1)

## 2021-02-04 MED ORDER — SODIUM CHLORIDE 0.9% FLUSH
10.0000 mL | INTRAVENOUS | Status: DC | PRN
  Administered 2021-02-04: 10 mL

## 2021-02-04 MED ORDER — SODIUM CHLORIDE 0.9 % IV SOLN
50.0000 mg/m2 | Freq: Once | INTRAVENOUS | Status: AC
  Administered 2021-02-04: 100 mg via INTRAVENOUS
  Filled 2021-02-04: qty 5

## 2021-02-04 MED ORDER — DIPHENHYDRAMINE HCL 25 MG PO CAPS
50.0000 mg | ORAL_CAPSULE | Freq: Once | ORAL | Status: AC
  Administered 2021-02-04: 50 mg via ORAL
  Filled 2021-02-04: qty 2

## 2021-02-04 MED ORDER — SODIUM CHLORIDE 0.9 % IV SOLN
375.0000 mg/m2 | Freq: Once | INTRAVENOUS | Status: AC
  Administered 2021-02-04: 700 mg via INTRAVENOUS
  Filled 2021-02-04: qty 50

## 2021-02-04 MED ORDER — PREDNISONE 20 MG PO TABS
60.0000 mg | ORAL_TABLET | Freq: Every day | ORAL | 4 refills | Status: DC
  Filled 2021-02-04: qty 12, 4d supply, fill #0
  Filled 2021-02-18: qty 12, 4d supply, fill #1

## 2021-02-04 MED ORDER — PALONOSETRON HCL INJECTION 0.25 MG/5ML
0.2500 mg | Freq: Once | INTRAVENOUS | Status: AC
  Administered 2021-02-04: 0.25 mg via INTRAVENOUS
  Filled 2021-02-04: qty 5

## 2021-02-04 MED ORDER — ETOPOSIDE 50 MG PO CAPS
150.0000 mg | ORAL_CAPSULE | Freq: Every day | ORAL | 0 refills | Status: DC
  Filled 2021-02-04: qty 6, 2d supply, fill #0

## 2021-02-04 MED ORDER — SODIUM CHLORIDE 0.9 % IV SOLN
600.0000 mg/m2 | Freq: Once | INTRAVENOUS | Status: AC
  Administered 2021-02-04: 1160 mg via INTRAVENOUS
  Filled 2021-02-04: qty 58

## 2021-02-04 MED ORDER — SODIUM CHLORIDE 0.9 % IV SOLN: Freq: Once | INTRAVENOUS | Status: AC

## 2021-02-04 MED ORDER — SODIUM CHLORIDE 0.9 % IV SOLN
10.0000 mg | Freq: Once | INTRAVENOUS | Status: AC
  Administered 2021-02-04: 10 mg via INTRAVENOUS
  Filled 2021-02-04: qty 1

## 2021-02-04 MED ORDER — HEPARIN SOD (PORK) LOCK FLUSH 100 UNIT/ML IV SOLN
500.0000 [IU] | Freq: Once | INTRAVENOUS | Status: AC | PRN
  Administered 2021-02-04: 500 [IU]

## 2021-02-04 MED ORDER — ACETAMINOPHEN 325 MG PO TABS
650.0000 mg | ORAL_TABLET | Freq: Once | ORAL | Status: AC
  Administered 2021-02-04: 650 mg via ORAL
  Filled 2021-02-04: qty 2

## 2021-02-04 NOTE — Patient Instructions (Signed)

## 2021-02-04 NOTE — Progress Notes (Signed)
Per Provider  Etoposide 50 mg cap take 3 cap x 2 days starting Feb 05 2021 Refill sent to [facility] per family

## 2021-02-04 NOTE — Progress Notes (Signed)
Patient presents for treatment. RN assessment completed along with the following:  Labs/vitals reviewed - Yes, and Per Dr. Benay Spice, ok to treat with Scr 1.55. Per secure chat with Dr. Benay Spice, ask him to hold lisinopril, return for bmet 11/11, push fluids. Pt made aware and verbalized understanding.   Weight within 10% of previous measurement - Yes Oncology Treatment Attestation completed for current therapy- Yes, on date 01/08/21 Informed consent completed and reflects current therapy/intent - Yes, on date 01/14/21             Provider progress note reviewed - Yes, today's provider note was reviewed. Treatment/Antibody/Supportive plan reviewed - Yes, and there are no adjustments needed for today's treatment. S&H and other orders reviewed - Yes, and there are no additional orders identified. Previous treatment date reviewed - Yes, and the appropriate amount of time has elapsed between treatments.   Patient to proceed with treatment.

## 2021-02-04 NOTE — Patient Instructions (Addendum)
**Per Dr. Benay Spice, hold off on taking your lisinopril.  Make sure you are drinking plenty of fluids.  You will come in on 11/11 to have your labs checked. [facility]  [place]  Discharge Instructions: Thank you for choosing [facility] to provide your oncology and hematology care.   If you have a lab appointment with the [facility], please go directly to the [facility] and check in at the registration area.   Wear comfortable clothing and clothing appropriate for easy access to any Portacath or PICC line.   We strive to give you quality time with your provider. You may need to reschedule your appointment if you arrive late (15 or more minutes).  Arriving late affects you and other patients whose appointments are after yours.  Also, if you miss three or more appointments without notifying the office, you may be dismissed from the clinic at the provider's discretion.      For prescription refill requests, have your pharmacy contact our office and allow 72 hours for refills to be completed.    Today you received the following chemotherapy and/or immunotherapy agents: cyclophosphamide, etoposide, rituximab      To help prevent nausea and vomiting after your treatment, we encourage you to take your nausea medication as directed.  BELOW ARE SYMPTOMS THAT SHOULD BE REPORTED IMMEDIATELY: *FEVER GREATER THAN 100.4 F (38 C) OR HIGHER *CHILLS OR SWEATING *NAUSEA AND VOMITING THAT IS NOT CONTROLLED WITH YOUR NAUSEA MEDICATION *UNUSUAL SHORTNESS OF BREATH *UNUSUAL BRUISING OR BLEEDING *URINARY PROBLEMS (pain or burning when urinating, or frequent urination) *BOWEL PROBLEMS (unusual diarrhea, constipation, pain near the anus) TENDERNESS IN MOUTH AND THROAT WITH OR WITHOUT PRESENCE OF ULCERS (sore throat, sores in mouth, or a toothache) UNUSUAL RASH, SWELLING OR PAIN  UNUSUAL VAGINAL DISCHARGE OR ITCHING   Items with * indicate a potential emergency and  should be followed up as soon as possible or go to the Emergency Department if any problems should occur.  Please show the CHEMOTHERAPY ALERT CARD or IMMUNOTHERAPY ALERT CARD at check-in to the Emergency Department and triage nurse.  Should you have questions after your visit or need to cancel or reschedule your appointment, please contact [facility]  Dept: [phone]  and follow the prompts.  Office hours are 8:00 a.m. to 4:30 p.m. Monday - Friday. Please note that voicemails left after 4:00 p.m. may not be returned until the following business day.  We are closed weekends and major holidays. You have access to a nurse at all times for urgent questions. Please call the main number to the clinic Dept: [phone] and follow the prompts.   For any non-urgent questions, you may also contact your provider using MyChart. We now offer e-Visits for anyone 65 and older to request care online for non-urgent symptoms. For details visit [email or web address].   Also download the MyChart app! Go to the app store, search "MyChart", open the app, select [facility], and log in with your MyChart username and password.  Due to Covid, a mask is required upon entering the hospital/clinic. If you do not have a mask, one will be given to you upon arrival. For doctor visits, patients may have 1 support person aged 99 or older with them. For treatment visits, patients cannot have anyone with them due to current Covid guidelines and our immunocompromised population.   Cyclophosphamide Injection What is this medication? CYCLOPHOSPHAMIDE (sye kloe FOSS fa mide) is a chemotherapy  drug. It slows the growth of cancer cells. This medicine is used to treat many types of cancer like lymphoma, myeloma, leukemia, breast cancer, and ovarian cancer, to name a few. This medicine may be used for other purposes; ask your health care provider or pharmacist if you have questions. COMMON BRAND NAME(S):  Cytoxan, Neosar What should I tell my care team before I take this medication? They need to know if you have any of these conditions: heart disease history of irregular heartbeat infection kidney disease liver disease low blood counts, like white cells, platelets, or red blood cells on hemodialysis recent or ongoing radiation therapy scarring or thickening of the lungs trouble passing urine an unusual or allergic reaction to cyclophosphamide, other medicines, foods, dyes, or preservatives pregnant or trying to get pregnant breast-feeding How should I use this medication? This drug is usually given as an injection into a vein or muscle or by infusion into a vein. It is administered in a hospital or clinic by a specially trained health care professional. Talk to your pediatrician regarding the use of this medicine in children. Special care may be needed. Overdosage: If you think you have taken too much of this medicine contact a poison control center or emergency room at once. NOTE: This medicine is only for you. Do not share this medicine with others. What if I miss a dose? It is important not to miss your dose. Call your doctor or health care professional if you are unable to keep an appointment. What may interact with this medication? amphotericin B azathioprine certain antivirals for HIV or hepatitis certain medicines for blood pressure, heart disease, irregular heart beat certain medicines that treat or prevent blood clots like warfarin certain other medicines for cancer cyclosporine etanercept indomethacin medicines that relax muscles for surgery medicines to increase blood counts metronidazole This list may not describe all possible interactions. Give your health care provider a list of all the medicines, herbs, non-prescription drugs, or dietary supplements you use. Also tell them if you smoke, drink alcohol, or use illegal drugs. Some items may interact with your  medicine. What should I watch for while using this medication? Your condition will be monitored carefully while you are receiving this medicine. You may need blood work done while you are taking this medicine. Drink water or other fluids as directed. Urinate often, even at night. Some products may contain alcohol. Ask your health care professional if this medicine contains alcohol. Be sure to tell all health care professionals you are taking this medicine. Certain medicines, like metronidazole and disulfiram, can cause an unpleasant reaction when taken with alcohol. The reaction includes flushing, headache, nausea, vomiting, sweating, and increased thirst. The reaction can last from 30 minutes to several hours. Do not become pregnant while taking this medicine or for 1 year after stopping it. Women should inform their health care professional if they wish to become pregnant or think they might be pregnant. Men should not father a child while taking this medicine and for 4 months after stopping it. There is potential for serious side effects to an unborn child. Talk to your health care professional for more information. Do not breast-feed an infant while taking this medicine or for 1 week after stopping it. This medicine has caused ovarian failure in some women. This medicine may make it more difficult to get pregnant. Talk to your health care professional if you are concerned about your fertility. This medicine has caused decreased sperm counts in some men. This  may make it more difficult to father a child. Talk to your health care professional if you are concerned about your fertility. Call your health care professional for advice if you get a fever, chills, or sore throat, or other symptoms of a cold or flu. Do not treat yourself. This medicine decreases your body's ability to fight infections. Try to avoid being around people who are sick. Avoid taking medicines that contain aspirin, acetaminophen,  ibuprofen, naproxen, or ketoprofen unless instructed by your health care professional. These medicines may hide a fever. Talk to your health care professional about your risk of cancer. You may be more at risk for certain types of cancer if you take this medicine. If you are going to need surgery or other procedure, tell your health care professional that you are using this medicine. Be careful brushing or flossing your teeth or using a toothpick because you may get an infection or bleed more easily. If you have any dental work done, tell your dentist you are receiving this medicine. What side effects may I notice from receiving this medication? Side effects that you should report to your doctor or health care professional as soon as possible: allergic reactions like skin rash, itching or hives, swelling of the face, lips, or tongue breathing problems nausea, vomiting signs and symptoms of bleeding such as bloody or black, tarry stools; red or dark brown urine; spitting up blood or brown material that looks like coffee grounds; red spots on the skin; unusual bruising or bleeding from the eyes, gums, or nose signs and symptoms of heart failure like fast, irregular heartbeat, sudden weight gain; swelling of the ankles, feet, hands signs and symptoms of infection like fever; chills; cough; sore throat; pain or trouble passing urine signs and symptoms of kidney injury like trouble passing urine or change in the amount of urine signs and symptoms of liver injury like dark yellow or brown urine; general ill feeling or flu-like symptoms; light-colored stools; loss of appetite; nausea; right upper belly pain; unusually weak or tired; yellowing of the eyes or skin Side effects that usually do not require medical attention (report to your doctor or health care professional if they continue or are bothersome): confusion decreased hearing diarrhea facial flushing hair loss headache loss of appetite missed  menstrual periods signs and symptoms of low red blood cells or anemia such as unusually weak or tired; feeling faint or lightheaded; falls skin discoloration This list may not describe all possible side effects. Call your doctor for medical advice about side effects. You may report side effects to FDA at 1-800-FDA-1088. Where should I keep my medication? This drug is given in a hospital or clinic and will not be stored at home. NOTE: This sheet is a summary. It may not cover all possible information. If you have questions about this medicine, talk to your doctor, pharmacist, or health care provider.  2022 Elsevier/Gold Standard (2018-12-19 09:53:29)  Etoposide, VP-16 injection What is this medication? ETOPOSIDE, VP-16 (e toe POE side) is a chemotherapy drug. It is used to treat testicular cancer, lung cancer, and other cancers. This medicine may be used for other purposes; ask your health care provider or pharmacist if you have questions. COMMON BRAND NAME(S): Etopophos, Toposar, VePesid What should I tell my care team before I take this medication? They need to know if you have any of these conditions: infection kidney disease liver disease low blood counts, like low white cell, platelet, or red cell counts an unusual or  allergic reaction to etoposide, other medicines, foods, dyes, or preservatives pregnant or trying to get pregnant breast-feeding How should I use this medication? This medicine is for infusion into a vein. It is administered in a hospital or clinic by a specially trained health care professional. Talk to your pediatrician regarding the use of this medicine in children. Special care may be needed. Overdosage: If you think you have taken too much of this medicine contact a poison control center or emergency room at once. NOTE: This medicine is only for you. Do not share this medicine with others. What if I miss a dose? It is important not to miss your dose. Call your  doctor or health care professional if you are unable to keep an appointment. What may interact with this medication? This medicine may interact with the following medications: warfarin This list may not describe all possible interactions. Give your health care provider a list of all the medicines, herbs, non-prescription drugs, or dietary supplements you use. Also tell them if you smoke, drink alcohol, or use illegal drugs. Some items may interact with your medicine. What should I watch for while using this medication? Visit your doctor for checks on your progress. This drug may make you feel generally unwell. This is not uncommon, as chemotherapy can affect healthy cells as well as cancer cells. Report any side effects. Continue your course of treatment even though you feel ill unless your doctor tells you to stop. In some cases, you may be given additional medicines to help with side effects. Follow all directions for their use. Call your doctor or health care professional for advice if you get a fever, chills or sore throat, or other symptoms of a cold or flu. Do not treat yourself. This drug decreases your body's ability to fight infections. Try to avoid being around people who are sick. This medicine may increase your risk to bruise or bleed. Call your doctor or health care professional if you notice any unusual bleeding. Talk to your doctor about your risk of cancer. You may be more at risk for certain types of cancers if you take this medicine. Do not become pregnant while taking this medicine or for at least 6 months after stopping it. Women should inform their doctor if they wish to become pregnant or think they might be pregnant. Women of child-bearing potential will need to have a negative pregnancy test before starting this medicine. There is a potential for serious side effects to an unborn child. Talk to your health care professional or pharmacist for more information. Do not breast-feed an  infant while taking this medicine. Men must use a latex condom during sexual contact with a woman while taking this medicine and for at least 4 months after stopping it. A latex condom is needed even if you have had a vasectomy. Contact your doctor right away if your partner becomes pregnant. Do not donate sperm while taking this medicine and for at least 4 months after you stop taking this medicine. Men should inform their doctors if they wish to father a child. This medicine may lower sperm counts. What side effects may I notice from receiving this medication? Side effects that you should report to your doctor or health care professional as soon as possible: allergic reactions like skin rash, itching or hives, swelling of the face, lips, or tongue low blood counts - this medicine may decrease the number of white blood cells, red blood cells, and platelets. You may be at  increased risk for infections and bleeding nausea, vomiting redness, blistering, peeling or loosening of the skin, including inside the mouth signs and symptoms of infection like fever; chills; cough; sore throat; pain or trouble passing urine signs and symptoms of low red blood cells or anemia such as unusually weak or tired; feeling faint or lightheaded; falls; breathing problems unusual bruising or bleeding Side effects that usually do not require medical attention (report to your doctor or health care professional if they continue or are bothersome): changes in taste diarrhea hair loss loss of appetite mouth sores This list may not describe all possible side effects. Call your doctor for medical advice about side effects. You may report side effects to FDA at 1-800-FDA-1088. Where should I keep my medication? This drug is given in a hospital or clinic and will not be stored at home. NOTE: This sheet is a summary. It may not cover all possible information. If you have questions about this medicine, talk to your doctor,  pharmacist, or health care provider.  2022 Elsevier/Gold Standard (2018-05-11 16:57:15)  Rituximab Injection What is this medication? RITUXIMAB (ri TUX i mab) is a monoclonal antibody. It is used to treat certain types of cancer like non-Hodgkin lymphoma and chronic lymphocytic leukemia. It is also used to treat rheumatoid arthritis, granulomatosis with polyangiitis, microscopic polyangiitis, and pemphigus vulgaris. This medicine may be used for other purposes; ask your health care provider or pharmacist if you have questions. COMMON BRAND NAME(S): RIABNI, Rituxan, RUXIENCE What should I tell my care team before I take this medication? They need to know if you have any of these conditions: chest pain heart disease infection especially a viral infection such as chickenpox, cold sores, hepatitis B, or herpes immune system problems irregular heartbeat or rhythm kidney disease low blood counts (white cells, platelets, or red cells) lung disease recent or upcoming vaccine an unusual or allergic reaction to rituximab, other medicines, foods, dyes, or preservatives pregnant or trying to get pregnant breast-feeding How should I use this medication? This medicine is injected into a vein. It is given by a health care provider in a hospital or clinic setting. A special MedGuide will be given to you before each treatment. Be sure to read this information carefully each time. Talk to your health care provider about the use of this medicine in children. While this drug may be prescribed for children as young as 6 months for selected conditions, precautions do apply. Overdosage: If you think you have taken too much of this medicine contact a poison control center or emergency room at once. NOTE: This medicine is only for you. Do not share this medicine with others. What if I miss a dose? Keep appointments for follow-up doses. It is important not to miss your dose. Call your health care provider if you  are unable to keep an appointment. What may interact with this medication? Do not take this medicine with any of the following medicines: live vaccines This medicine may also interact with the following medicines: cisplatin This list may not describe all possible interactions. Give your health care provider a list of all the medicines, herbs, non-prescription drugs, or dietary supplements you use. Also tell them if you smoke, drink alcohol, or use illegal drugs. Some items may interact with your medicine. What should I watch for while using this medication? Your condition will be monitored carefully while you are receiving this medicine. You may need blood work done while you are taking this medicine. This medicine can  cause serious infusion reactions. To reduce the risk your health care provider may give you other medicines to take before receiving this one. Be sure to follow the directions from your health care provider. This medicine may increase your risk of getting an infection. Call your health care provider for advice if you get a fever, chills, sore throat, or other symptoms of a cold or flu. Do not treat yourself. Try to avoid being around people who are sick. Call your health care provider if you are around anyone with measles, chickenpox, or if you develop sores or blisters that do not heal properly. Avoid taking medicines that contain aspirin, acetaminophen, ibuprofen, naproxen, or ketoprofen unless instructed by your health care provider. These medicines may hide a fever. This medicine may cause serious skin reactions. They can happen weeks to months after starting the medicine. Contact your health care provider right away if you notice fevers or flu-like symptoms with a rash. The rash may be red or purple and then turn into blisters or peeling of the skin. Or, you might notice a red rash with swelling of the face, lips or lymph nodes in your neck or under your arms. In some patients, this  medicine may cause a serious brain infection that may cause death. If you have any problems seeing, thinking, speaking, walking, or standing, tell your healthcare professional right away. If you cannot reach your healthcare professional, urgently seek other source of medical care. Do not become pregnant while taking this medicine or for at least 12 months after stopping it. Women should inform their health care provider if they wish to become pregnant or think they might be pregnant. There is potential for serious harm to an unborn child. Talk to your health care provider for more information. Women should use a reliable form of birth control while taking this medicine and for 12 months after stopping it. Do not breast-feed while taking this medicine or for at least 6 months after stopping it. What side effects may I notice from receiving this medication? Side effects that you should report to your health care provider as soon as possible: allergic reactions (skin rash, itching or hives; swelling of the face, lips, or tongue) diarrhea edema (sudden weight gain; swelling of the ankles, feet, hands or other unusual swelling; trouble breathing) fast, irregular heartbeat heart attack (trouble breathing; pain or tightness in the chest, neck, back or arms; unusually weak or tired) infection (fever, chills, cough, sore throat, pain or trouble passing urine) kidney injury (trouble passing urine or change in the amount of urine) liver injury (dark yellow or brown urine; general ill feeling or flu-like symptoms; loss of appetite, right upper belly pain; unusually weak or tired, yellowing of the eyes or skin) low blood pressure (dizziness; feeling faint or lightheaded, falls; unusually weak or tired) low red blood cell counts (trouble breathing; feeling faint; lightheaded, falls; unusually weak or tired) mouth sores redness, blistering, peeling, or loosening of the skin, including inside the mouth stomach  pain unusual bruising or bleeding wheezing (trouble breathing with loud or whistling sounds) vomiting Side effects that usually do not require medical attention (report to your health care provider if they continue or are bothersome): headache joint pain muscle cramps, pain nausea This list may not describe all possible side effects. Call your doctor for medical advice about side effects. You may report side effects to FDA at 1-800-FDA-1088. Where should I keep my medication? This medicine is given in a hospital or clinic. It  will not be stored at home. NOTE: This sheet is a summary. It may not cover all possible information. If you have questions about this medicine, talk to your doctor, pharmacist, or health care provider.  2022 Elsevier/Gold Standard (2020-03-07 15:47:26)

## 2021-02-04 NOTE — Progress Notes (Signed)
Patient seen by Dr. Sherrill today ? ?Vitals are within treatment parameters. ? ?Labs reviewed by Dr. Sherrill and are within treatment parameters. ? ?Per physician team, patient is ready for treatment and there are NO modifications to the treatment plan.  ?

## 2021-02-04 NOTE — Progress Notes (Signed)
  [place] OFFICE PROGRESS NOTE   Diagnosis: Non-Hodgkin's lymphoma  INTERVAL HISTORY:   [name] [name] returns as scheduled.  He completed cycle 1 chemotherapy on 01/14/2021.  He reports tolerating the treatment well.  No nausea, mouth sores, diarrhea, or symptoms of an allergic reaction.  He feels well.  No complaint.  Objective:  Vital signs in last 24 hours:  Blood pressure (!) 140/92, pulse 89, temperature 98.1 F (36.7 C), temperature source Oral, resp. rate 18, height 5\' 8"  (1.727 m), weight 168 lb 3.2 oz (76.3 kg), SpO2 99 %.    HEENT: No thrush or ulcers Resp: Lungs clear bilaterally Cardio: Regular rate and rhythm GI: No hepatosplenomegaly, nontender Vascular: No leg edema   Portacath/PICC-without e a surrounding ecchymosis-resolving  Lab Results:  Lab Results  Component Value Date   WBC 5.7 02/04/2021   HGB 12.9 (L) 02/04/2021   HCT 41.9 02/04/2021   MCV 80.3 02/04/2021   PLT 181 02/04/2021   NEUTROABS 4.1 02/04/2021    CMP  Lab Results  Component Value Date   NA 140 01/08/2021   K 4.4 01/08/2021   CL 107 01/08/2021   CO2 25 01/08/2021   GLUCOSE 101 (H) 01/08/2021   BUN 23 01/08/2021   CREATININE 1.16 01/08/2021   CALCIUM 8.9 01/08/2021   PROT 6.5 01/08/2021   ALBUMIN 4.1 01/08/2021   AST 14 (L) 01/08/2021   ALT 10 01/08/2021   ALKPHOS 71 01/08/2021   BILITOT 0.4 01/08/2021   GFRNONAA >60 01/08/2021   GFRAA 59 (L) 03/26/2020    Lab Results  Component Value Date   CEA 0.9 11/20/2020    Medications: I have reviewed the patient's current medications.   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 CT abdomen/pelvis 11/18/2020-2.4 x 2.3 segment 7 lesion, multiple additional subcentimeter lesions, some but not all present in 2016 CT chest 11/23/2020-11 mm right retrocrural node, asbestosis related pleural disease CT-guided biopsy of the left periaortic lymph node 12/12/2020-large B-cell lymphoma, CD5  positive, CD20 positive, FISH panel-BCL6 gene rearrangement positive, MYC negative PET 01/01/2021-hypermetabolic left supraclavicular and left upper mediastinal nodes, bulky hypermetabolic periaortic retroperitoneal nodes, single hypermetabolic metastasis in the right liver, multiple hypermetabolic spleen lesions Cycle 1 CEOP-Rituxan 01/14/2021 Cycle 2 CEOP-Rituxan 02/04/2021 Iron deficiency anemia Coronary artery disease Peripheral vascular disease CHF Hypertension Carotid artery disease CVA Peripheral neuropathy   Disposition: [name] [name] tolerated the first cycle chemotherapy well.  He will complete cycle 2 today.  He will return for an office visit and cycle 3 chemotherapy in 3 weeks.  He declines influenza and COVID-19 vaccines.  He will be scheduled for a restaging PET scan after cycle 4.  The creatinine is more elevated today.  The potassium is mildly elevated.  We will ask him to hold lisinopril.  He will return for a repeat chemistry panel on 02/07/2021.  Betsy Coder, MD  02/04/2021  8:34 AM

## 2021-02-05 ENCOUNTER — Other Ambulatory Visit (HOSPITAL_COMMUNITY): Payer: Self-pay

## 2021-02-07 ENCOUNTER — Telehealth: Payer: Self-pay | Admitting: Nurse Practitioner

## 2021-02-07 ENCOUNTER — Inpatient Hospital Stay: Payer: PPO

## 2021-02-07 ENCOUNTER — Other Ambulatory Visit: Payer: Self-pay

## 2021-02-07 ENCOUNTER — Other Ambulatory Visit: Payer: Self-pay | Admitting: Nurse Practitioner

## 2021-02-07 VITALS — BP 132/88 | HR 84 | Temp 98.2°F | Resp 20

## 2021-02-07 DIAGNOSIS — Z5112 Encounter for antineoplastic immunotherapy: Secondary | ICD-10-CM | POA: Diagnosis not present

## 2021-02-07 DIAGNOSIS — C8580 Other specified types of non-Hodgkin lymphoma, unspecified site: Secondary | ICD-10-CM

## 2021-02-07 DIAGNOSIS — E875 Hyperkalemia: Secondary | ICD-10-CM

## 2021-02-07 DIAGNOSIS — Z95828 Presence of other vascular implants and grafts: Secondary | ICD-10-CM

## 2021-02-07 LAB — BASIC METABOLIC PANEL - CANCER CENTER ONLY
Anion gap: 9 (ref 5–15)
BUN: 40 mg/dL — ABNORMAL HIGH (ref 8–23)
CO2: 22 mmol/L (ref 22–32)
Calcium: 8.7 mg/dL — ABNORMAL LOW (ref 8.9–10.3)
Chloride: 103 mmol/L (ref 98–111)
Creatinine: 1.32 mg/dL — ABNORMAL HIGH (ref 0.61–1.24)
GFR, Estimated: 54 mL/min — ABNORMAL LOW (ref >60)
Glucose, Bld: 169 mg/dL — ABNORMAL HIGH (ref 70–99)
Potassium: 5.4 mmol/L — ABNORMAL HIGH (ref 3.5–5.1)
Sodium: 134 mmol/L — ABNORMAL LOW (ref 135–145)

## 2021-02-07 MED ORDER — HEPARIN SOD (PORK) LOCK FLUSH 100 UNIT/ML IV SOLN
500.0000 [IU] | Freq: Once | INTRAVENOUS | Status: AC
  Administered 2021-02-07: 500 [IU] via INTRAVENOUS

## 2021-02-07 MED ORDER — SODIUM CHLORIDE 0.9% FLUSH
10.0000 mL | Freq: Once | INTRAVENOUS | Status: AC
  Administered 2021-02-07: 10 mL via INTRAVENOUS

## 2021-02-07 MED ORDER — SODIUM POLYSTYRENE SULFONATE 15 GM/60ML PO SUSP: 30.0000 g | Freq: Once | ORAL | 0 refills | Status: AC

## 2021-02-07 NOTE — Telephone Encounter (Signed)
I notified [name] potassium remains elevated.  He confirms he has stopped taking lisinopril.  Dr. Benay Spice recommends Kayexalate 30 g p.o. x1.  He agrees with this.  Prescription sent to his pharmacy.

## 2021-02-10 ENCOUNTER — Inpatient Hospital Stay: Payer: PPO

## 2021-02-10 ENCOUNTER — Other Ambulatory Visit: Payer: Self-pay

## 2021-02-10 ENCOUNTER — Telehealth: Payer: Self-pay | Admitting: Family Medicine

## 2021-02-10 VITALS — BP 109/71 | HR 75 | Temp 98.6°F | Resp 20

## 2021-02-10 DIAGNOSIS — E875 Hyperkalemia: Secondary | ICD-10-CM

## 2021-02-10 DIAGNOSIS — Z5112 Encounter for antineoplastic immunotherapy: Secondary | ICD-10-CM | POA: Diagnosis not present

## 2021-02-10 DIAGNOSIS — Z95828 Presence of other vascular implants and grafts: Secondary | ICD-10-CM

## 2021-02-10 DIAGNOSIS — Z79899 Other long term (current) drug therapy: Secondary | ICD-10-CM

## 2021-02-10 DIAGNOSIS — C8580 Other specified types of non-Hodgkin lymphoma, unspecified site: Secondary | ICD-10-CM

## 2021-02-10 DIAGNOSIS — I10 Essential (primary) hypertension: Secondary | ICD-10-CM

## 2021-02-10 LAB — BASIC METABOLIC PANEL - CANCER CENTER ONLY
Anion gap: 8 (ref 5–15)
BUN: 33 mg/dL — ABNORMAL HIGH (ref 8–23)
CO2: 27 mmol/L (ref 22–32)
Calcium: 8.3 mg/dL — ABNORMAL LOW (ref 8.9–10.3)
Chloride: 104 mmol/L (ref 98–111)
Creatinine: 1.26 mg/dL — ABNORMAL HIGH (ref 0.61–1.24)
GFR, Estimated: 56 mL/min — ABNORMAL LOW (ref >60)
Glucose, Bld: 96 mg/dL (ref 70–99)
Potassium: 4.4 mmol/L (ref 3.5–5.1)
Sodium: 139 mmol/L (ref 135–145)

## 2021-02-10 MED ORDER — SODIUM CHLORIDE 0.9% FLUSH
10.0000 mL | Freq: Once | INTRAVENOUS | Status: AC
  Administered 2021-02-10: 10 mL via INTRAVENOUS

## 2021-02-10 MED ORDER — HEPARIN SOD (PORK) LOCK FLUSH 100 UNIT/ML IV SOLN
500.0000 [IU] | Freq: Once | INTRAVENOUS | Status: AC
  Administered 2021-02-10: 500 [IU] via INTRAVENOUS

## 2021-02-10 NOTE — Telephone Encounter (Signed)
Please go ahead with metabolic 7 and magnesium Diagnoses hypertension high risk med

## 2021-02-10 NOTE — Telephone Encounter (Signed)
Patient's wife called and stated that Dr. Breck Coons, oncologist wanted pt to get his potassium checked as soon as possible by Dr. Nicki Reaper. Has appointment 12/5, could not schedule sooner. Mrs. [name] [name] asked if Dr. Nicki Reaper could just order labs for the potassium check?? Please advise.   Wife#: [phone]

## 2021-02-10 NOTE — Telephone Encounter (Signed)
Wife (DPR) notified and verbalized understanding. 

## 2021-02-10 NOTE — Patient Instructions (Signed)

## 2021-02-10 NOTE — Telephone Encounter (Signed)
Blood work ordered in Epic. Left message to return call 

## 2021-02-11 DIAGNOSIS — I10 Essential (primary) hypertension: Secondary | ICD-10-CM | POA: Diagnosis not present

## 2021-02-11 DIAGNOSIS — Z79899 Other long term (current) drug therapy: Secondary | ICD-10-CM | POA: Diagnosis not present

## 2021-02-12 ENCOUNTER — Encounter: Payer: Self-pay | Admitting: Oncology

## 2021-02-12 LAB — BASIC METABOLIC PANEL
BUN/Creatinine Ratio: 20 (ref 10–24)
BUN: 24 mg/dL (ref 8–27)
CO2: 23 mmol/L (ref 20–29)
Calcium: 8.6 mg/dL (ref 8.6–10.2)
Chloride: 103 mmol/L (ref 96–106)
Creatinine, Ser: 1.2 mg/dL (ref 0.76–1.27)
Glucose: 116 mg/dL — ABNORMAL HIGH (ref 70–99)
Potassium: 4.8 mmol/L (ref 3.5–5.2)
Sodium: 139 mmol/L (ref 134–144)
eGFR: 60 mL/min/{1.73_m2} (ref >59)

## 2021-02-12 LAB — MAGNESIUM: Magnesium: 2 mg/dL (ref 1.6–2.3)

## 2021-02-13 NOTE — Progress Notes (Signed)
Left message to return call 

## 2021-02-17 ENCOUNTER — Ambulatory Visit (INDEPENDENT_AMBULATORY_CARE_PROVIDER_SITE_OTHER): Payer: PPO | Admitting: Family Medicine

## 2021-02-17 ENCOUNTER — Other Ambulatory Visit: Payer: Self-pay

## 2021-02-17 DIAGNOSIS — F439 Reaction to severe stress, unspecified: Secondary | ICD-10-CM

## 2021-02-17 NOTE — Progress Notes (Signed)
   Subjective:    Patient ID: [name] [name], male    DOB: 04-02-1934, 86 y.o.   MRN: [id number]  HPI  Wife arrives for a consult on her husband we had a good discussion about how he was doing.  He has underlying cancer and going through chemo treatments.  They are concerned about bringing him inside so therefore I went outside to see him  Overall he states he is hanging in there denies feeling depressed at times he does relate he gets stressed.  His energy level is okay appetite is okay we did recent blood work which showed potassium looks good he does relate intermittent cramps in his hands.  His wife wonders if he needs to be on antidepressant or antianxiety medicine patient does not feel he needs to be  Review of Systems     Objective:   Physical Exam  Exam was deferred today in our full time was spent discussing stress and how he is handling the cancer      Assessment & Plan:  Stress related issues Continue cancer infusions Follow-up as planned in approximately 2 weeks We are willing to do a car visit if he wants to stay away from being inside around other people  Hold off on any depression medicine or anxiety medicine currently

## 2021-02-17 NOTE — Telephone Encounter (Signed)
Patient spoken to today

## 2021-02-18 ENCOUNTER — Other Ambulatory Visit: Payer: Self-pay | Admitting: Oncology

## 2021-02-18 ENCOUNTER — Other Ambulatory Visit (HOSPITAL_COMMUNITY): Payer: Self-pay

## 2021-02-18 MED ORDER — ETOPOSIDE 50 MG PO CAPS
150.0000 mg | ORAL_CAPSULE | Freq: Every day | ORAL | 0 refills | Status: DC
Start: 2021-02-18 — End: 2021-03-18
  Filled 2021-02-18: qty 6, 2d supply, fill #0

## 2021-02-19 ENCOUNTER — Other Ambulatory Visit (HOSPITAL_COMMUNITY): Payer: Self-pay

## 2021-02-23 ENCOUNTER — Other Ambulatory Visit: Payer: Self-pay | Admitting: Oncology

## 2021-02-24 ENCOUNTER — Other Ambulatory Visit (HOSPITAL_COMMUNITY): Payer: Self-pay

## 2021-02-25 ENCOUNTER — Inpatient Hospital Stay: Payer: PPO

## 2021-02-25 ENCOUNTER — Inpatient Hospital Stay: Payer: PPO | Admitting: Oncology

## 2021-02-25 ENCOUNTER — Other Ambulatory Visit: Payer: Self-pay

## 2021-02-25 VITALS — BP 126/87 | HR 68 | Temp 98.1°F | Resp 18

## 2021-02-25 VITALS — BP 129/90 | HR 78 | Temp 97.8°F | Resp 18 | Ht 68.0 in | Wt 174.4 lb

## 2021-02-25 DIAGNOSIS — C8580 Other specified types of non-Hodgkin lymphoma, unspecified site: Secondary | ICD-10-CM | POA: Diagnosis not present

## 2021-02-25 DIAGNOSIS — Z5112 Encounter for antineoplastic immunotherapy: Secondary | ICD-10-CM | POA: Diagnosis not present

## 2021-02-25 LAB — CMP (CANCER CENTER ONLY)
ALT: 13 U/L (ref 0–44)
AST: 13 U/L — ABNORMAL LOW (ref 15–41)
Albumin: 4 g/dL (ref 3.5–5.0)
Alkaline Phosphatase: 62 U/L (ref 38–126)
Anion gap: 7 (ref 5–15)
BUN: 20 mg/dL (ref 8–23)
CO2: 26 mmol/L (ref 22–32)
Calcium: 9 mg/dL (ref 8.9–10.3)
Chloride: 108 mmol/L (ref 98–111)
Creatinine: 1.19 mg/dL (ref 0.61–1.24)
GFR, Estimated: >60 mL/min (ref >60)
Glucose, Bld: 90 mg/dL (ref 70–99)
Potassium: 4.6 mmol/L (ref 3.5–5.1)
Sodium: 141 mmol/L (ref 135–145)
Total Bilirubin: 0.3 mg/dL (ref 0.3–1.2)
Total Protein: 6 g/dL — ABNORMAL LOW (ref 6.5–8.1)

## 2021-02-25 LAB — CBC WITH DIFFERENTIAL (CANCER CENTER ONLY)
Abs Immature Granulocytes: 0.02 10*3/uL (ref 0.00–0.07)
Basophils Absolute: 0.1 10*3/uL (ref 0.0–0.1)
Basophils Relative: 1 %
Eosinophils Absolute: 0.3 10*3/uL (ref 0.0–0.5)
Eosinophils Relative: 5 %
HCT: 38.2 % — ABNORMAL LOW (ref 39.0–52.0)
Hemoglobin: 11.8 g/dL — ABNORMAL LOW (ref 13.0–17.0)
Immature Granulocytes: 0 %
Lymphocytes Relative: 10 %
Lymphs Abs: 0.5 10*3/uL — ABNORMAL LOW (ref 0.7–4.0)
MCH: 24.6 pg — ABNORMAL LOW (ref 26.0–34.0)
MCHC: 30.9 g/dL (ref 30.0–36.0)
MCV: 79.6 fL — ABNORMAL LOW (ref 80.0–100.0)
Monocytes Absolute: 0.9 10*3/uL (ref 0.1–1.0)
Monocytes Relative: 16 %
Neutro Abs: 3.8 10*3/uL (ref 1.7–7.7)
Neutrophils Relative %: 68 %
Platelet Count: 161 10*3/uL (ref 150–400)
RBC: 4.8 MIL/uL (ref 4.22–5.81)
RDW: 18.3 % — ABNORMAL HIGH (ref 11.5–15.5)
WBC Count: 5.5 10*3/uL (ref 4.0–10.5)
nRBC: 0 % (ref 0.0–0.2)

## 2021-02-25 LAB — LACTATE DEHYDROGENASE: LDH: 170 U/L (ref 98–192)

## 2021-02-25 MED ORDER — SODIUM CHLORIDE 0.9 % IV SOLN
50.0000 mg/m2 | Freq: Once | INTRAVENOUS | Status: AC
  Administered 2021-02-25: 100 mg via INTRAVENOUS
  Filled 2021-02-25: qty 5

## 2021-02-25 MED ORDER — SODIUM CHLORIDE 0.9% FLUSH
10.0000 mL | INTRAVENOUS | Status: DC | PRN
  Administered 2021-02-25: 10 mL

## 2021-02-25 MED ORDER — SODIUM CHLORIDE 0.9 % IV SOLN
375.0000 mg/m2 | Freq: Once | INTRAVENOUS | Status: AC
  Administered 2021-02-25: 700 mg via INTRAVENOUS
  Filled 2021-02-25: qty 50

## 2021-02-25 MED ORDER — SODIUM CHLORIDE 0.9 % IV SOLN: Freq: Once | INTRAVENOUS | Status: AC

## 2021-02-25 MED ORDER — ACETAMINOPHEN 325 MG PO TABS
650.0000 mg | ORAL_TABLET | Freq: Once | ORAL | Status: AC
  Administered 2021-02-25: 650 mg via ORAL

## 2021-02-25 MED ORDER — PALONOSETRON HCL INJECTION 0.25 MG/5ML
0.2500 mg | Freq: Once | INTRAVENOUS | Status: AC
  Administered 2021-02-25: 0.25 mg via INTRAVENOUS

## 2021-02-25 MED ORDER — SODIUM CHLORIDE 0.9 % IV SOLN
600.0000 mg/m2 | Freq: Once | INTRAVENOUS | Status: AC
  Administered 2021-02-25: 1160 mg via INTRAVENOUS
  Filled 2021-02-25: qty 58

## 2021-02-25 MED ORDER — DIPHENHYDRAMINE HCL 25 MG PO CAPS
50.0000 mg | ORAL_CAPSULE | Freq: Once | ORAL | Status: AC
  Administered 2021-02-25: 50 mg via ORAL

## 2021-02-25 MED ORDER — HEPARIN SOD (PORK) LOCK FLUSH 100 UNIT/ML IV SOLN
500.0000 [IU] | Freq: Once | INTRAVENOUS | Status: AC | PRN
  Administered 2021-02-25: 500 [IU]

## 2021-02-25 MED ORDER — SODIUM CHLORIDE 0.9 % IV SOLN
10.0000 mg | Freq: Once | INTRAVENOUS | Status: AC
  Administered 2021-02-25: 10 mg via INTRAVENOUS
  Filled 2021-02-25: qty 1

## 2021-02-25 NOTE — Progress Notes (Signed)
  [name] [name] OFFICE PROGRESS NOTE   Diagnosis: Non-Hodgkin's lymphoma  INTERVAL HISTORY:   [name] [name] completed another cycle of chemotherapy beginning 02/04/2021.  No nausea/vomiting, mouth sores, or symptoms of an allergic reaction.  He reports increased peripheral neuropathy symptoms.  He has malaise after discontinuing prednisone.  Objective:  Vital signs in last 24 hours:  Blood pressure 129/90, pulse 78, temperature 97.8 F (36.6 C), temperature source Oral, resp. rate 18, height 5\' 8"  (1.727 m), weight 174 lb 6.4 oz (79.1 kg), SpO2 98 %.    HEENT: No thrush or ulcers Resp: Lungs clear bilaterally Cardio: Regular rate and rhythm GI: No hepatosplenomegaly Vascular: Trace ankle edema bilaterally   Portacath/PICC-without erythema  Lab Results:  Lab Results  Component Value Date   WBC 5.5 02/25/2021   HGB 11.8 (L) 02/25/2021   HCT 38.2 (L) 02/25/2021   MCV 79.6 (L) 02/25/2021   PLT 161 02/25/2021   NEUTROABS 3.8 02/25/2021    CMP  Lab Results  Component Value Date   NA 141 02/25/2021   K 4.6 02/25/2021   CL 108 02/25/2021   CO2 26 02/25/2021   GLUCOSE 90 02/25/2021   BUN 20 02/25/2021   CREATININE 1.19 02/25/2021   CALCIUM 9.0 02/25/2021   PROT 6.0 (L) 02/25/2021   ALBUMIN 4.0 02/25/2021   AST 13 (L) 02/25/2021   ALT 13 02/25/2021   ALKPHOS 62 02/25/2021   BILITOT 0.3 02/25/2021   GFRNONAA >60 02/25/2021   GFRAA 59 (L) 03/26/2020    Medications: I have reviewed the patient's current medications.   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 CT abdomen/pelvis 11/18/2020-2.4 x 2.3 segment 7 lesion, multiple additional subcentimeter lesions, some but not all present in 2016 CT chest 11/23/2020-11 mm right retrocrural node, asbestosis related pleural disease CT-guided biopsy of the left periaortic lymph node 12/12/2020-large B-cell lymphoma, CD5 positive, CD20 positive, FISH panel-BCL6 gene rearrangement  positive, MYC negative PET 01/01/2021-hypermetabolic left supraclavicular and left upper mediastinal nodes, bulky hypermetabolic periaortic retroperitoneal nodes, single hypermetabolic metastasis in the right liver, multiple hypermetabolic spleen lesions Cycle 1 CEOP-Rituxan 01/14/2021 Cycle 2 CEOP-Rituxan 02/04/2021 Cycle 3 CEOP-Rituxan 02/25/2021 Iron deficiency anemia Coronary artery disease Peripheral vascular disease CHF Hypertension Carotid artery disease CVA Peripheral neuropathy    Disposition: [name] [name] appears stable.  He appears to be tolerating the chemotherapy well.  He will complete another cycle beginning today.  Vincristine will remain on hold secondary to neuropathy symptoms.  He will return for an office visit and cycle 4 chemotherapy in 3 weeks.  [name] [name] will be referred for a restaging PET scan after cycle 4.  Betsy Coder, MD  02/25/2021  9:20 AM

## 2021-02-25 NOTE — Progress Notes (Signed)
Patient presents for treatment. RN assessment completed along with the following:  Labs/vitals reviewed - Yes, and within treatment parameters.   Weight within 10% of previous measurement - Yes Oncology Treatment Attestation completed for current therapy- Yes, on date 01/08/21 Informed consent completed and reflects current therapy/intent - Yes, on date 01/14/21             Provider progress note reviewed - Today's provider note is not yet available. I reviewed the most recent oncology provider progress note in chart dated 02/07/21. Treatment/Antibody/Supportive plan reviewed - Yes, and there are no adjustments needed for today's treatment. S&H and other orders reviewed - Yes, and there are no additional orders identified. Previous treatment date reviewed - Yes, and the appropriate amount of time has elapsed between treatments. Clinic Hand Off Received from - Doristine Section, RN  Patient to proceed with treatment.

## 2021-02-25 NOTE — Progress Notes (Signed)
Patient seen by Dr. Sherrill today ? ?Vitals are within treatment parameters. ? ?Labs reviewed by Dr. Sherrill and are within treatment parameters. ? ?Per physician team, patient is ready for treatment and there are NO modifications to the treatment plan.  ?

## 2021-02-25 NOTE — Patient Instructions (Signed)
[place]   Discharge Instructions: Thank you for choosing [facility] to provide your oncology and hematology care.   If you have a lab appointment with the [facility], please go directly to the [facility] and check in at the registration area.   Wear comfortable clothing and clothing appropriate for easy access to any Portacath or PICC line.   We strive to give you quality time with your provider. You may need to reschedule your appointment if you arrive late (15 or more minutes).  Arriving late affects you and other patients whose appointments are after yours.  Also, if you miss three or more appointments without notifying the office, you may be dismissed from the clinic at the provider's discretion.      For prescription refill requests, have your pharmacy contact our office and allow 72 hours for refills to be completed.    Today you received the following chemotherapy and/or immunotherapy agents cytoxan, etoposide, rituximab-pvvr      To help prevent nausea and vomiting after your treatment, we encourage you to take your nausea medication as directed.  BELOW ARE SYMPTOMS THAT SHOULD BE REPORTED IMMEDIATELY: *FEVER GREATER THAN 100.4 F (38 C) OR HIGHER *CHILLS OR SWEATING *NAUSEA AND VOMITING THAT IS NOT CONTROLLED WITH YOUR NAUSEA MEDICATION *UNUSUAL SHORTNESS OF BREATH *UNUSUAL BRUISING OR BLEEDING *URINARY PROBLEMS (pain or burning when urinating, or frequent urination) *BOWEL PROBLEMS (unusual diarrhea, constipation, pain near the anus) TENDERNESS IN MOUTH AND THROAT WITH OR WITHOUT PRESENCE OF ULCERS (sore throat, sores in mouth, or a toothache) UNUSUAL RASH, SWELLING OR PAIN  UNUSUAL VAGINAL DISCHARGE OR ITCHING   Items with * indicate a potential emergency and should be followed up as soon as possible or go to the Emergency Department if any problems should occur.  Please show the CHEMOTHERAPY ALERT CARD or IMMUNOTHERAPY  ALERT CARD at check-in to the Emergency Department and triage nurse.  Should you have questions after your visit or need to cancel or reschedule your appointment, please contact [facility]  Dept: [phone]  and follow the prompts.  Office hours are 8:00 a.m. to 4:30 p.m. Monday - Friday. Please note that voicemails left after 4:00 p.m. may not be returned until the following business day.  We are closed weekends and major holidays. You have access to a nurse at all times for urgent questions. Please call the main number to the clinic Dept: [phone] and follow the prompts.   For any non-urgent questions, you may also contact your provider using MyChart. We now offer e-Visits for anyone 33 and older to request care online for non-urgent symptoms. For details visit [email or web address].   Also download the MyChart app! Go to the app store, search "MyChart", open the app, select [facility], and log in with your MyChart username and password.  Due to Covid, a mask is required upon entering the hospital/clinic. If you do not have a mask, one will be given to you upon arrival. For doctor visits, patients may have 1 support person aged 21 or older with them. For treatment visits, patients cannot have anyone with them due to current Covid guidelines and our immunocompromised population.   Cyclophosphamide Injection What is this medication? CYCLOPHOSPHAMIDE (sye kloe FOSS fa mide) is a chemotherapy drug. It slows the growth of cancer cells. This medicine is used to treat many types of cancer like lymphoma, myeloma, leukemia, breast cancer, and ovarian cancer, to name a few.  This medicine may be used for other purposes; ask your health care provider or pharmacist if you have questions. COMMON BRAND NAME(S): Cytoxan, Neosar What should I tell my care team before I take this medication? They need to know if you have any of these conditions: heart disease history of  irregular heartbeat infection kidney disease liver disease low blood counts, like white cells, platelets, or red blood cells on hemodialysis recent or ongoing radiation therapy scarring or thickening of the lungs trouble passing urine an unusual or allergic reaction to cyclophosphamide, other medicines, foods, dyes, or preservatives pregnant or trying to get pregnant breast-feeding How should I use this medication? This drug is usually given as an injection into a vein or muscle or by infusion into a vein. It is administered in a hospital or clinic by a specially trained health care professional. Talk to your pediatrician regarding the use of this medicine in children. Special care may be needed. Overdosage: If you think you have taken too much of this medicine contact a poison control center or emergency room at once. NOTE: This medicine is only for you. Do not share this medicine with others. What if I miss a dose? It is important not to miss your dose. Call your doctor or health care professional if you are unable to keep an appointment. What may interact with this medication? amphotericin B azathioprine certain antivirals for HIV or hepatitis certain medicines for blood pressure, heart disease, irregular heart beat certain medicines that treat or prevent blood clots like warfarin certain other medicines for cancer cyclosporine etanercept indomethacin medicines that relax muscles for surgery medicines to increase blood counts metronidazole This list may not describe all possible interactions. Give your health care provider a list of all the medicines, herbs, non-prescription drugs, or dietary supplements you use. Also tell them if you smoke, drink alcohol, or use illegal drugs. Some items may interact with your medicine. What should I watch for while using this medication? Your condition will be monitored carefully while you are receiving this medicine. You may need blood work  done while you are taking this medicine. Drink water or other fluids as directed. Urinate often, even at night. Some products may contain alcohol. Ask your health care professional if this medicine contains alcohol. Be sure to tell all health care professionals you are taking this medicine. Certain medicines, like metronidazole and disulfiram, can cause an unpleasant reaction when taken with alcohol. The reaction includes flushing, headache, nausea, vomiting, sweating, and increased thirst. The reaction can last from 30 minutes to several hours. Do not become pregnant while taking this medicine or for 1 year after stopping it. Women should inform their health care professional if they wish to become pregnant or think they might be pregnant. Men should not father a child while taking this medicine and for 4 months after stopping it. There is potential for serious side effects to an unborn child. Talk to your health care professional for more information. Do not breast-feed an infant while taking this medicine or for 1 week after stopping it. This medicine has caused ovarian failure in some women. This medicine may make it more difficult to get pregnant. Talk to your health care professional if you are concerned about your fertility. This medicine has caused decreased sperm counts in some men. This may make it more difficult to father a child. Talk to your health care professional if you are concerned about your fertility. Call your health care professional for advice if you  get a fever, chills, or sore throat, or other symptoms of a cold or flu. Do not treat yourself. This medicine decreases your body's ability to fight infections. Try to avoid being around people who are sick. Avoid taking medicines that contain aspirin, acetaminophen, ibuprofen, naproxen, or ketoprofen unless instructed by your health care professional. These medicines may hide a fever. Talk to your health care professional about your risk  of cancer. You may be more at risk for certain types of cancer if you take this medicine. If you are going to need surgery or other procedure, tell your health care professional that you are using this medicine. Be careful brushing or flossing your teeth or using a toothpick because you may get an infection or bleed more easily. If you have any dental work done, tell your dentist you are receiving this medicine. What side effects may I notice from receiving this medication? Side effects that you should report to your doctor or health care professional as soon as possible: allergic reactions like skin rash, itching or hives, swelling of the face, lips, or tongue breathing problems nausea, vomiting signs and symptoms of bleeding such as bloody or black, tarry stools; red or dark brown urine; spitting up blood or brown material that looks like coffee grounds; red spots on the skin; unusual bruising or bleeding from the eyes, gums, or nose signs and symptoms of heart failure like fast, irregular heartbeat, sudden weight gain; swelling of the ankles, feet, hands signs and symptoms of infection like fever; chills; cough; sore throat; pain or trouble passing urine signs and symptoms of kidney injury like trouble passing urine or change in the amount of urine signs and symptoms of liver injury like dark yellow or brown urine; general ill feeling or flu-like symptoms; light-colored stools; loss of appetite; nausea; right upper belly pain; unusually weak or tired; yellowing of the eyes or skin Side effects that usually do not require medical attention (report to your doctor or health care professional if they continue or are bothersome): confusion decreased hearing diarrhea facial flushing hair loss headache loss of appetite missed menstrual periods signs and symptoms of low red blood cells or anemia such as unusually weak or tired; feeling faint or lightheaded; falls skin discoloration This list may  not describe all possible side effects. Call your doctor for medical advice about side effects. You may report side effects to FDA at 1-800-FDA-1088. Where should I keep my medication? This drug is given in a hospital or clinic and will not be stored at home. NOTE: This sheet is a summary. It may not cover all possible information. If you have questions about this medicine, talk to your doctor, pharmacist, or health care provider.  2022 Elsevier/Gold Standard (2020-12-03 00:00:00)  Etoposide, VP-16 injection What is this medication? ETOPOSIDE, VP-16 (e toe POE side) is a chemotherapy drug. It is used to treat testicular cancer, lung cancer, and other cancers. This medicine may be used for other purposes; ask your health care provider or pharmacist if you have questions. COMMON BRAND NAME(S): Etopophos, Toposar, VePesid What should I tell my care team before I take this medication? They need to know if you have any of these conditions: infection kidney disease liver disease low blood counts, like low white cell, platelet, or red cell counts an unusual or allergic reaction to etoposide, other medicines, foods, dyes, or preservatives pregnant or trying to get pregnant breast-feeding How should I use this medication? This medicine is for infusion into a vein.  It is administered in a hospital or clinic by a specially trained health care professional. Talk to your pediatrician regarding the use of this medicine in children. Special care may be needed. Overdosage: If you think you have taken too much of this medicine contact a poison control center or emergency room at once. NOTE: This medicine is only for you. Do not share this medicine with others. What if I miss a dose? It is important not to miss your dose. Call your doctor or health care professional if you are unable to keep an appointment. What may interact with this medication? This medicine may interact with the following  medications: warfarin This list may not describe all possible interactions. Give your health care provider a list of all the medicines, herbs, non-prescription drugs, or dietary supplements you use. Also tell them if you smoke, drink alcohol, or use illegal drugs. Some items may interact with your medicine. What should I watch for while using this medication? Visit your doctor for checks on your progress. This drug may make you feel generally unwell. This is not uncommon, as chemotherapy can affect healthy cells as well as cancer cells. Report any side effects. Continue your course of treatment even though you feel ill unless your doctor tells you to stop. In some cases, you may be given additional medicines to help with side effects. Follow all directions for their use. Call your doctor or health care professional for advice if you get a fever, chills or sore throat, or other symptoms of a cold or flu. Do not treat yourself. This drug decreases your body's ability to fight infections. Try to avoid being around people who are sick. This medicine may increase your risk to bruise or bleed. Call your doctor or health care professional if you notice any unusual bleeding. Talk to your doctor about your risk of cancer. You may be more at risk for certain types of cancers if you take this medicine. Do not become pregnant while taking this medicine or for at least 6 months after stopping it. Women should inform their doctor if they wish to become pregnant or think they might be pregnant. Women of child-bearing potential will need to have a negative pregnancy test before starting this medicine. There is a potential for serious side effects to an unborn child. Talk to your health care professional or pharmacist for more information. Do not breast-feed an infant while taking this medicine. Men must use a latex condom during sexual contact with a woman while taking this medicine and for at least 4 months after  stopping it. A latex condom is needed even if you have had a vasectomy. Contact your doctor right away if your partner becomes pregnant. Do not donate sperm while taking this medicine and for at least 4 months after you stop taking this medicine. Men should inform their doctors if they wish to father a child. This medicine may lower sperm counts. What side effects may I notice from receiving this medication? Side effects that you should report to your doctor or health care professional as soon as possible: allergic reactions like skin rash, itching or hives, swelling of the face, lips, or tongue low blood counts - this medicine may decrease the number of white blood cells, red blood cells, and platelets. You may be at increased risk for infections and bleeding nausea, vomiting redness, blistering, peeling or loosening of the skin, including inside the mouth signs and symptoms of infection like fever; chills; cough; sore throat;  pain or trouble passing urine signs and symptoms of low red blood cells or anemia such as unusually weak or tired; feeling faint or lightheaded; falls; breathing problems unusual bruising or bleeding Side effects that usually do not require medical attention (report to your doctor or health care professional if they continue or are bothersome): changes in taste diarrhea hair loss loss of appetite mouth sores This list may not describe all possible side effects. Call your doctor for medical advice about side effects. You may report side effects to FDA at 1-800-FDA-1088. Where should I keep my medication? This drug is given in a hospital or clinic and will not be stored at home. NOTE: This sheet is a summary. It may not cover all possible information. If you have questions about this medicine, talk to your doctor, pharmacist, or health care provider.  2022 Elsevier/Gold Standard (2020-12-03 00:00:00)  Rituximab Injection What is this medication? RITUXIMAB (ri TUX i mab)  is a monoclonal antibody. It is used to treat certain types of cancer like non-Hodgkin lymphoma and chronic lymphocytic leukemia. It is also used to treat rheumatoid arthritis, granulomatosis with polyangiitis, microscopic polyangiitis, and pemphigus vulgaris. This medicine may be used for other purposes; ask your health care provider or pharmacist if you have questions. COMMON BRAND NAME(S): RIABNI, Rituxan, RUXIENCE What should I tell my care team before I take this medication? They need to know if you have any of these conditions: chest pain heart disease infection especially a viral infection such as chickenpox, cold sores, hepatitis B, or herpes immune system problems irregular heartbeat or rhythm kidney disease low blood counts (white cells, platelets, or red cells) lung disease recent or upcoming vaccine an unusual or allergic reaction to rituximab, other medicines, foods, dyes, or preservatives pregnant or trying to get pregnant breast-feeding How should I use this medication? This medicine is injected into a vein. It is given by a health care provider in a hospital or clinic setting. A special MedGuide will be given to you before each treatment. Be sure to read this information carefully each time. Talk to your health care provider about the use of this medicine in children. While this drug may be prescribed for children as young as 6 months for selected conditions, precautions do apply. Overdosage: If you think you have taken too much of this medicine contact a poison control center or emergency room at once. NOTE: This medicine is only for you. Do not share this medicine with others. What if I miss a dose? Keep appointments for follow-up doses. It is important not to miss your dose. Call your health care provider if you are unable to keep an appointment. What may interact with this medication? Do not take this medicine with any of the following medicines: live vaccines This  medicine may also interact with the following medicines: cisplatin This list may not describe all possible interactions. Give your health care provider a list of all the medicines, herbs, non-prescription drugs, or dietary supplements you use. Also tell them if you smoke, drink alcohol, or use illegal drugs. Some items may interact with your medicine. What should I watch for while using this medication? Your condition will be monitored carefully while you are receiving this medicine. You may need blood work done while you are taking this medicine. This medicine can cause serious infusion reactions. To reduce the risk your health care provider may give you other medicines to take before receiving this one. Be sure to follow the directions from your  health care provider. This medicine may increase your risk of getting an infection. Call your health care provider for advice if you get a fever, chills, sore throat, or other symptoms of a cold or flu. Do not treat yourself. Try to avoid being around people who are sick. Call your health care provider if you are around anyone with measles, chickenpox, or if you develop sores or blisters that do not heal properly. Avoid taking medicines that contain aspirin, acetaminophen, ibuprofen, naproxen, or ketoprofen unless instructed by your health care provider. These medicines may hide a fever. This medicine may cause serious skin reactions. They can happen weeks to months after starting the medicine. Contact your health care provider right away if you notice fevers or flu-like symptoms with a rash. The rash may be red or purple and then turn into blisters or peeling of the skin. Or, you might notice a red rash with swelling of the face, lips or lymph nodes in your neck or under your arms. In some patients, this medicine may cause a serious brain infection that may cause death. If you have any problems seeing, thinking, speaking, walking, or standing, tell your  healthcare professional right away. If you cannot reach your healthcare professional, urgently seek other source of medical care. Do not become pregnant while taking this medicine or for at least 12 months after stopping it. Women should inform their health care provider if they wish to become pregnant or think they might be pregnant. There is potential for serious harm to an unborn child. Talk to your health care provider for more information. Women should use a reliable form of birth control while taking this medicine and for 12 months after stopping it. Do not breast-feed while taking this medicine or for at least 6 months after stopping it. What side effects may I notice from receiving this medication? Side effects that you should report to your health care provider as soon as possible: allergic reactions (skin rash, itching or hives; swelling of the face, lips, or tongue) diarrhea edema (sudden weight gain; swelling of the ankles, feet, hands or other unusual swelling; trouble breathing) fast, irregular heartbeat heart attack (trouble breathing; pain or tightness in the chest, neck, back or arms; unusually weak or tired) infection (fever, chills, cough, sore throat, pain or trouble passing urine) kidney injury (trouble passing urine or change in the amount of urine) liver injury (dark yellow or brown urine; general ill feeling or flu-like symptoms; loss of appetite, right upper belly pain; unusually weak or tired, yellowing of the eyes or skin) low blood pressure (dizziness; feeling faint or lightheaded, falls; unusually weak or tired) low red blood cell counts (trouble breathing; feeling faint; lightheaded, falls; unusually weak or tired) mouth sores redness, blistering, peeling, or loosening of the skin, including inside the mouth stomach pain unusual bruising or bleeding wheezing (trouble breathing with loud or whistling sounds) vomiting Side effects that usually do not require medical  attention (report to your health care provider if they continue or are bothersome): headache joint pain muscle cramps, pain nausea This list may not describe all possible side effects. Call your doctor for medical advice about side effects. You may report side effects to FDA at 1-800-FDA-1088. Where should I keep my medication? This medicine is given in a hospital or clinic. It will not be stored at home. NOTE: This sheet is a summary. It may not cover all possible information. If you have questions about this medicine, talk to your doctor, pharmacist,  or health care provider.  2022 Elsevier/Gold Standard (2020-03-18 00:00:00)

## 2021-03-03 ENCOUNTER — Ambulatory Visit (INDEPENDENT_AMBULATORY_CARE_PROVIDER_SITE_OTHER): Payer: PPO | Admitting: Family Medicine

## 2021-03-03 ENCOUNTER — Other Ambulatory Visit: Payer: Self-pay

## 2021-03-03 VITALS — BP 136/80 | HR 97 | Temp 97.3°F | Ht 68.0 in | Wt 175.0 lb

## 2021-03-03 DIAGNOSIS — C8580 Other specified types of non-Hodgkin lymphoma, unspecified site: Secondary | ICD-10-CM

## 2021-03-03 DIAGNOSIS — F439 Reaction to severe stress, unspecified: Secondary | ICD-10-CM | POA: Diagnosis not present

## 2021-03-03 NOTE — Progress Notes (Signed)
   Subjective:    Patient ID: [name] [name], male    DOB: 1934/09/19, 86 y.o.   MRN: [id number]  HPI Routine health maintenance check - cancer check  Depression / anxiety  Going through cancer treatments Trying to hold up as best he can States at times he does get stressed sometimes feels down but he tries to rely on his religion to help him through He feels like his appetite is doing okay no breathing troubles or chest pain or abdominal pain Has several more cancer treatments yet to go  Review of Systems     Objective:   Physical Exam  General-in no acute distress Eyes-no discharge Lungs-respiratory rate normal, CTA CV-no murmurs,RRR Extremities skin warm dry no edema Neuro grossly normal Behavior normal, alert       Assessment & Plan:  Stress Some cancer reelated anxiety Followed closely by oncology Recheck in 6 weeks  Encouragement given patient does not want to start any type of antidepressant currently

## 2021-03-05 ENCOUNTER — Telehealth: Payer: Self-pay | Admitting: Pharmacy Technician

## 2021-03-06 ENCOUNTER — Other Ambulatory Visit (HOSPITAL_COMMUNITY): Payer: Self-pay

## 2021-03-06 ENCOUNTER — Encounter: Payer: Self-pay | Admitting: Oncology

## 2021-03-06 NOTE — Telephone Encounter (Signed)
Oral Oncology Patient Advocate Encounter   Was successful in securing patient a $ 5000 grant from [employer] to provide copayment coverage for his Etoposide.  This will keep the out of pocket expense at $0.     I left a voicemail for his daughter, Thayer Headings, to return my call about the grant approval.   The billing information is as follows and has been shared with [employer].   Member ID: [id number] Group ID: [id number] RxBin: [id number] Dates of Eligibility: 12/05/20 through 03/05/22  Fund:  [facility] [place] Phone [phone] Fax [phone] 03/06/2021 1:04 PM

## 2021-03-10 ENCOUNTER — Other Ambulatory Visit: Payer: Self-pay | Admitting: Family Medicine

## 2021-03-13 ENCOUNTER — Other Ambulatory Visit (HOSPITAL_COMMUNITY): Payer: Self-pay

## 2021-03-13 ENCOUNTER — Other Ambulatory Visit: Payer: Self-pay | Admitting: Family Medicine

## 2021-03-16 ENCOUNTER — Other Ambulatory Visit: Payer: Self-pay | Admitting: Oncology

## 2021-03-18 ENCOUNTER — Inpatient Hospital Stay: Payer: PPO

## 2021-03-18 ENCOUNTER — Inpatient Hospital Stay: Payer: PPO | Attending: Oncology | Admitting: Nurse Practitioner

## 2021-03-18 ENCOUNTER — Encounter: Payer: Self-pay | Admitting: Nurse Practitioner

## 2021-03-18 ENCOUNTER — Other Ambulatory Visit (HOSPITAL_COMMUNITY): Payer: Self-pay

## 2021-03-18 ENCOUNTER — Other Ambulatory Visit: Payer: Self-pay

## 2021-03-18 VITALS — BP 124/91 | HR 80 | Temp 98.1°F | Resp 18 | Ht 68.0 in | Wt 175.8 lb

## 2021-03-18 VITALS — BP 129/91 | HR 71 | Temp 98.2°F | Resp 18

## 2021-03-18 DIAGNOSIS — C8580 Other specified types of non-Hodgkin lymphoma, unspecified site: Secondary | ICD-10-CM

## 2021-03-18 DIAGNOSIS — Z5111 Encounter for antineoplastic chemotherapy: Secondary | ICD-10-CM | POA: Diagnosis not present

## 2021-03-18 DIAGNOSIS — C8518 Unspecified B-cell lymphoma, lymph nodes of multiple sites: Secondary | ICD-10-CM | POA: Diagnosis not present

## 2021-03-18 DIAGNOSIS — Z5112 Encounter for antineoplastic immunotherapy: Secondary | ICD-10-CM | POA: Insufficient documentation

## 2021-03-18 DIAGNOSIS — C8510 Unspecified B-cell lymphoma, unspecified site: Secondary | ICD-10-CM

## 2021-03-18 LAB — CMP (CANCER CENTER ONLY)
ALT: 13 U/L (ref 0–44)
AST: 19 U/L (ref 15–41)
Albumin: 3.9 g/dL (ref 3.5–5.0)
Alkaline Phosphatase: 64 U/L (ref 38–126)
Anion gap: 8 (ref 5–15)
BUN: 19 mg/dL (ref 8–23)
CO2: 26 mmol/L (ref 22–32)
Calcium: 8.4 mg/dL — ABNORMAL LOW (ref 8.9–10.3)
Chloride: 106 mmol/L (ref 98–111)
Creatinine: 1.19 mg/dL (ref 0.61–1.24)
GFR, Estimated: >60 mL/min (ref >60)
Glucose, Bld: 91 mg/dL (ref 70–99)
Potassium: 4.7 mmol/L (ref 3.5–5.1)
Sodium: 140 mmol/L (ref 135–145)
Total Bilirubin: 0.4 mg/dL (ref 0.3–1.2)
Total Protein: 6.1 g/dL — ABNORMAL LOW (ref 6.5–8.1)

## 2021-03-18 LAB — CBC WITH DIFFERENTIAL (CANCER CENTER ONLY)
Abs Immature Granulocytes: 0.15 10*3/uL — ABNORMAL HIGH (ref 0.00–0.07)
Basophils Absolute: 0 10*3/uL (ref 0.0–0.1)
Basophils Relative: 1 %
Eosinophils Absolute: 0.3 10*3/uL (ref 0.0–0.5)
Eosinophils Relative: 5 %
HCT: 37.9 % — ABNORMAL LOW (ref 39.0–52.0)
Hemoglobin: 11.7 g/dL — ABNORMAL LOW (ref 13.0–17.0)
Immature Granulocytes: 3 %
Lymphocytes Relative: 12 %
Lymphs Abs: 0.7 10*3/uL (ref 0.7–4.0)
MCH: 24.8 pg — ABNORMAL LOW (ref 26.0–34.0)
MCHC: 30.9 g/dL (ref 30.0–36.0)
MCV: 80.5 fL (ref 80.0–100.0)
Monocytes Absolute: 0.8 10*3/uL (ref 0.1–1.0)
Monocytes Relative: 14 %
Neutro Abs: 3.7 10*3/uL (ref 1.7–7.7)
Neutrophils Relative %: 65 %
Platelet Count: 168 10*3/uL (ref 150–400)
RBC: 4.71 MIL/uL (ref 4.22–5.81)
RDW: 18.7 % — ABNORMAL HIGH (ref 11.5–15.5)
WBC Count: 5.6 10*3/uL (ref 4.0–10.5)
nRBC: 0 % (ref 0.0–0.2)

## 2021-03-18 LAB — LACTATE DEHYDROGENASE: LDH: 308 U/L — ABNORMAL HIGH (ref 98–192)

## 2021-03-18 IMAGING — CT CT ANGIO CHEST
2 of 6 series · 19 of 36 positions shown · IV contrast (omnipaque)
Comparison: None.

CLINICAL DATA: Elevated D-dimer.

EXAM:
CT ANGIOGRAPHY CHEST WITH CONTRAST
TECHNIQUE: Multidetector CT imaging of the chest was performed using the
standard protocol during bolus administration of intravenous
contrast. Multiplanar CT image reconstructions and MIPs were
obtained to evaluate the vascular anatomy.
CONTRAST:  60mL OMNIPAQUE IOHEXOL 350 MG/ML SOLN

[Series 7: pe thins · axial · 0.98mm/px · z∈[+342,+618]mm · 18 of 439 slices shown]
[im 22/439  lung]
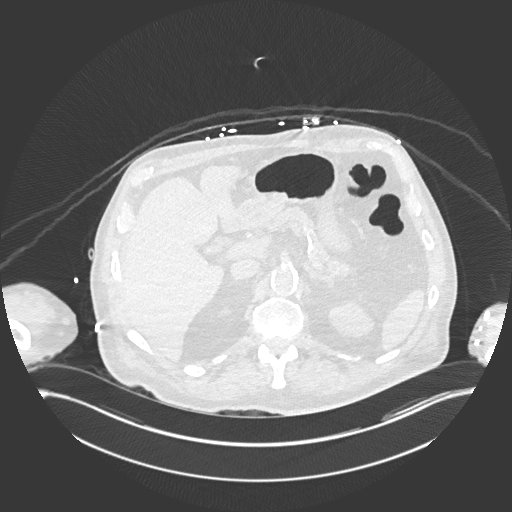
[im 44/439  mediastinal]
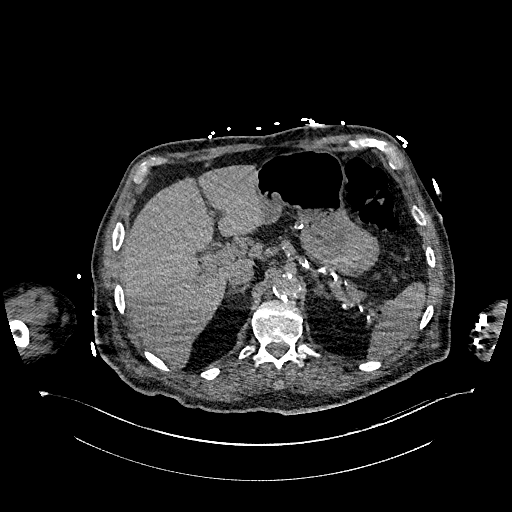
[im 66/439  lung]
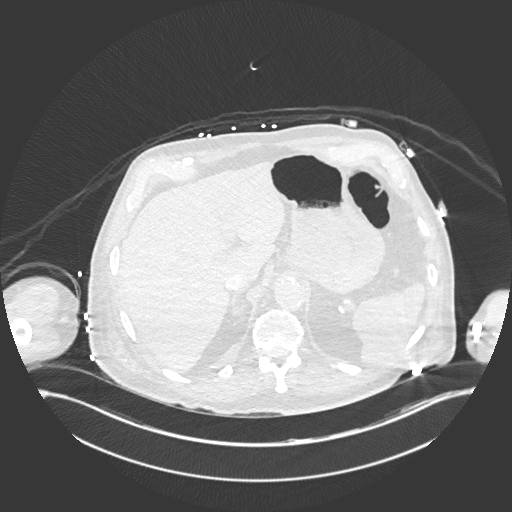
[im 88/439  mediastinal]
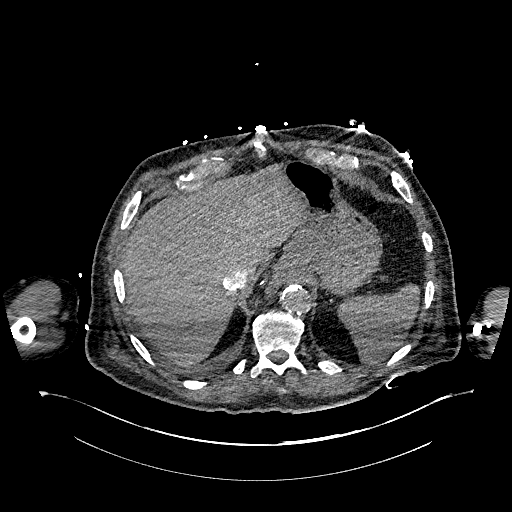
[im 110/439  lung]
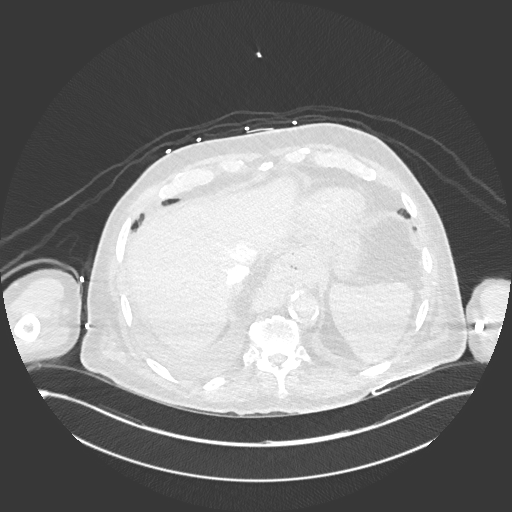
[im 132/439  mediastinal]
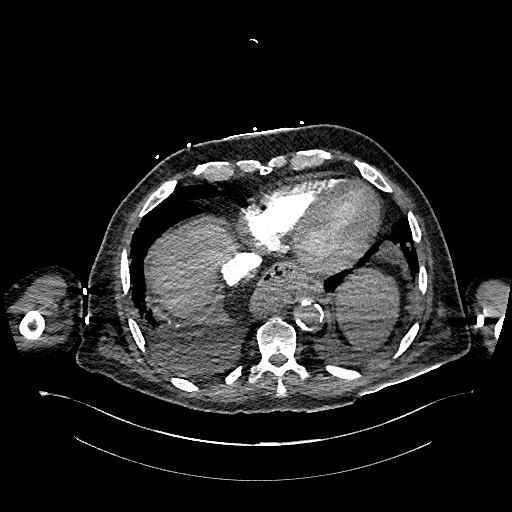
[im 154/439  lung]
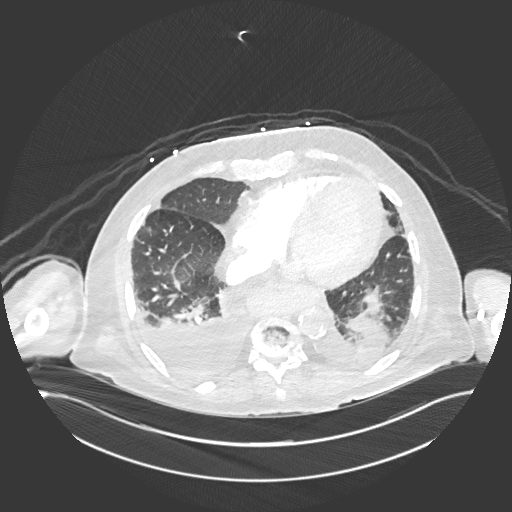
[im 176/439  mediastinal]
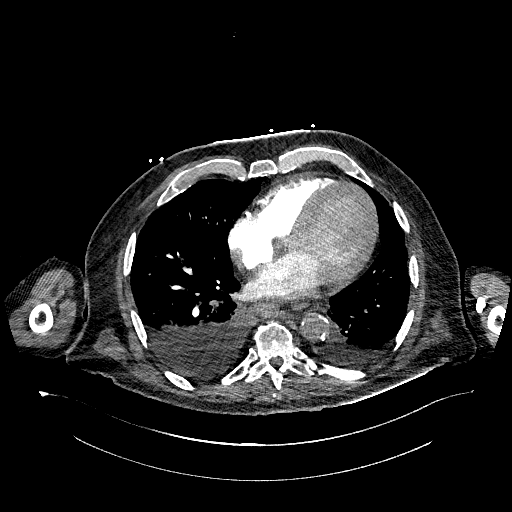
[im 198/439  lung]
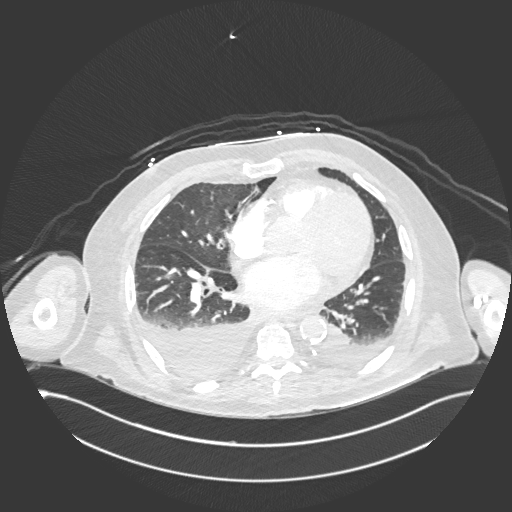
[im 241/439  mediastinal]
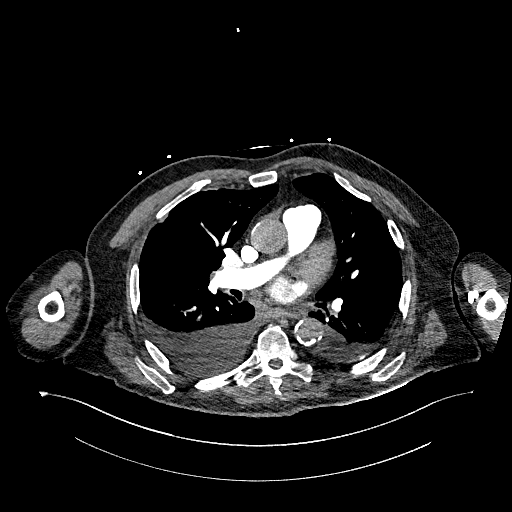
[im 263/439  lung]
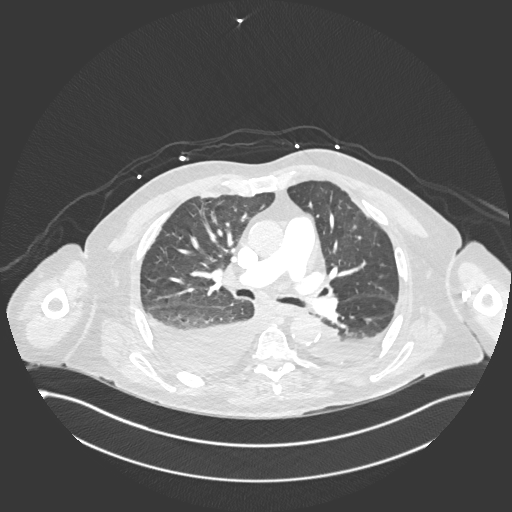
[im 285/439  mediastinal]
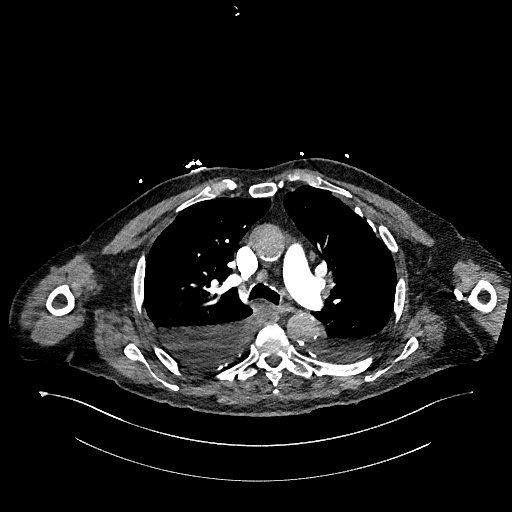
[im 307/439  lung]
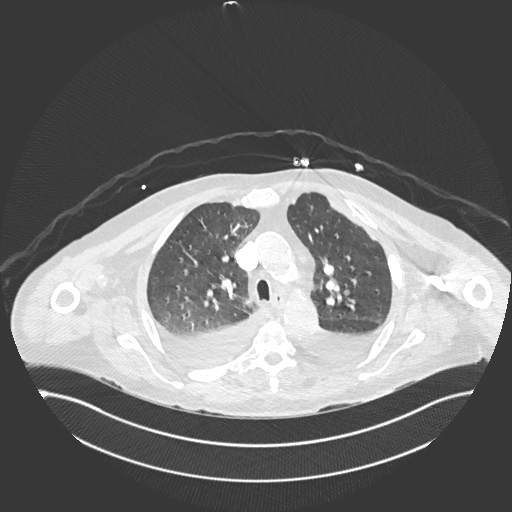
[im 329/439  mediastinal]
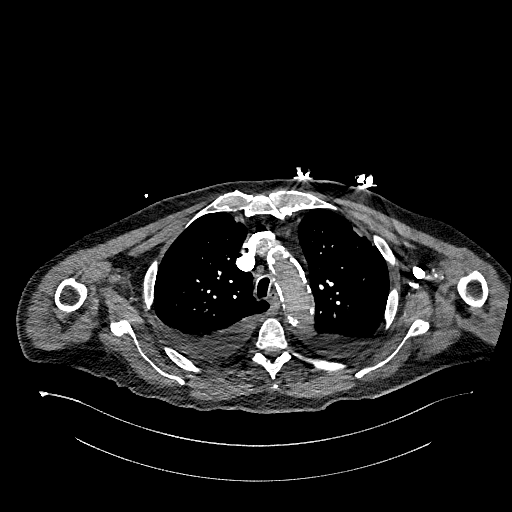
[im 351/439  lung]
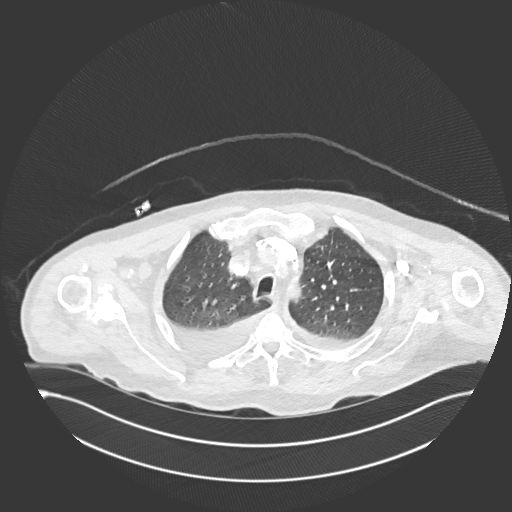
[im 373/439  mediastinal]
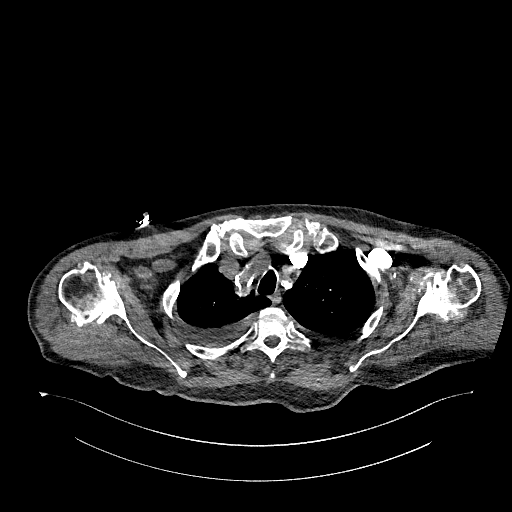
[im 395/439  lung]
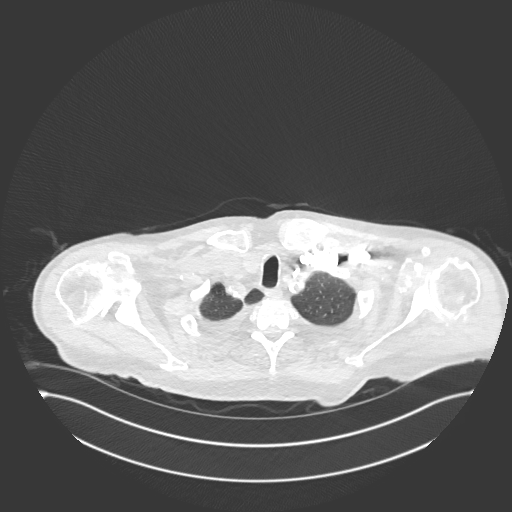
[im 417/439  mediastinal]
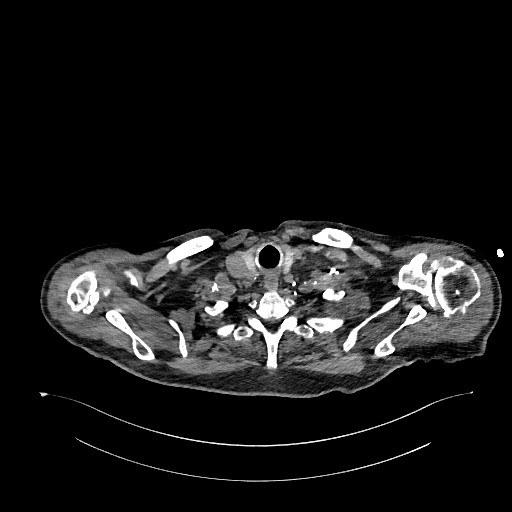

[Series 8: pe 2mm cor · coronal · 0.60mm/px · 1 of 151 slices shown]
[im 76/151  mediastinal]
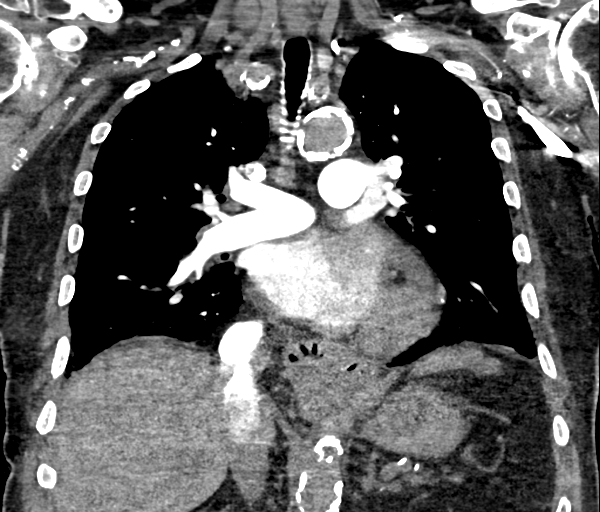

[19 of 36 positions shown; findings below may reference images not displayed]

FINDINGS: Cardiovascular: There is marked severity calcification of the
thoracic aorta. Satisfactory opacification of the pulmonary arteries
to the segmental level. No evidence of pulmonary embolism. Normal
heart size with marked severity coronary artery calcification. No
pericardial effusion.

Mediastinum/Nodes: No enlarged mediastinal, hilar, or axillary lymph
nodes. Thyroid gland, trachea, and esophagus demonstrate no
significant findings.

Lungs/Pleura: Mild areas of atelectasis and/or infiltrate are seen
within the bilateral lower lobes.

Small to moderate size bilateral pleural effusions are seen, right
greater than left.

No pneumothorax is identified.

Upper Abdomen: There is a large hiatal hernia.

Musculoskeletal: Multilevel degenerative changes seen throughout the
thoracic spine

Review of the MIP images confirms the above findings.
IMPRESSION: 1. No evidence of pulmonary embolus.
2. Mild bilateral lower lobe atelectasis and/or infiltrate.
3. Small to moderate size bilateral pleural effusions, right greater
than left.
4. Large hiatal hernia.

Aortic Atherosclerosis ([id number]).

## 2021-03-18 IMAGING — DX DG CHEST 2V
2 series · 2 of 2 positions shown · non-contrast
Comparison: Chest radiograph dated 03/26/2014

CLINICAL DATA: 85-year-old male with chest pain and shortness of
breath

EXAM:
CHEST - 2 VIEW

[chest pa]
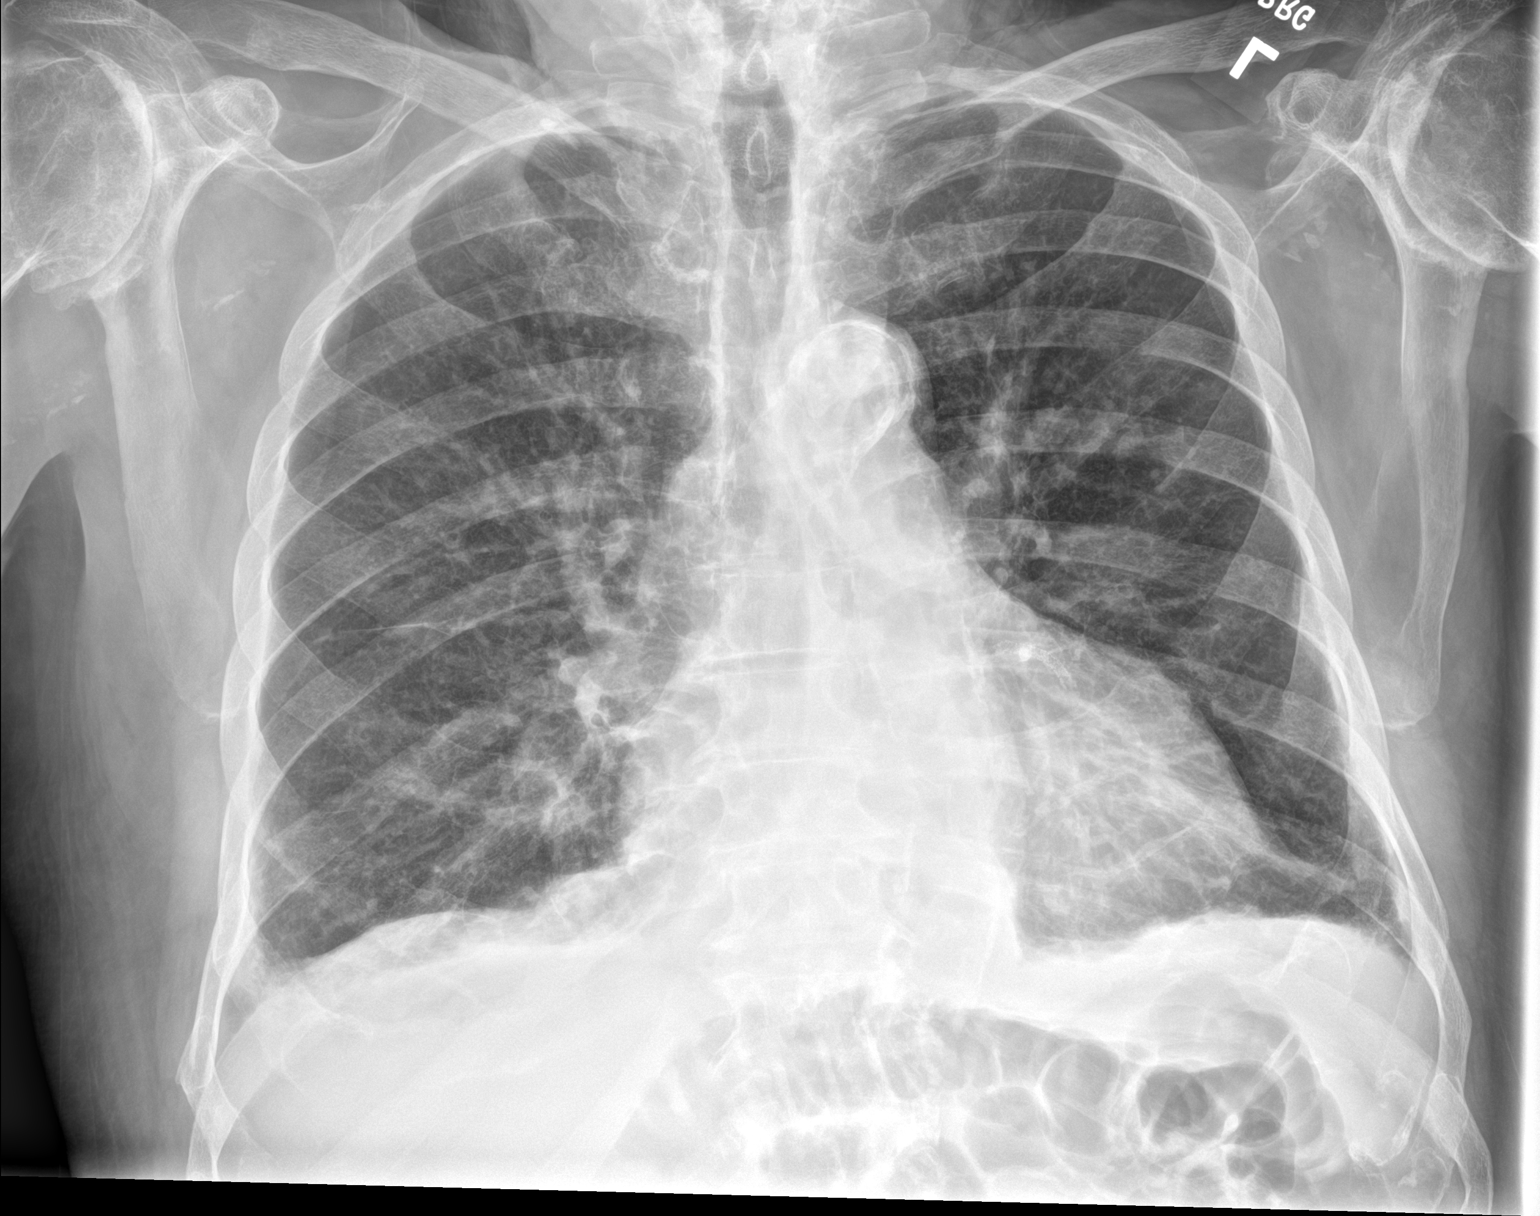

[chest lat]
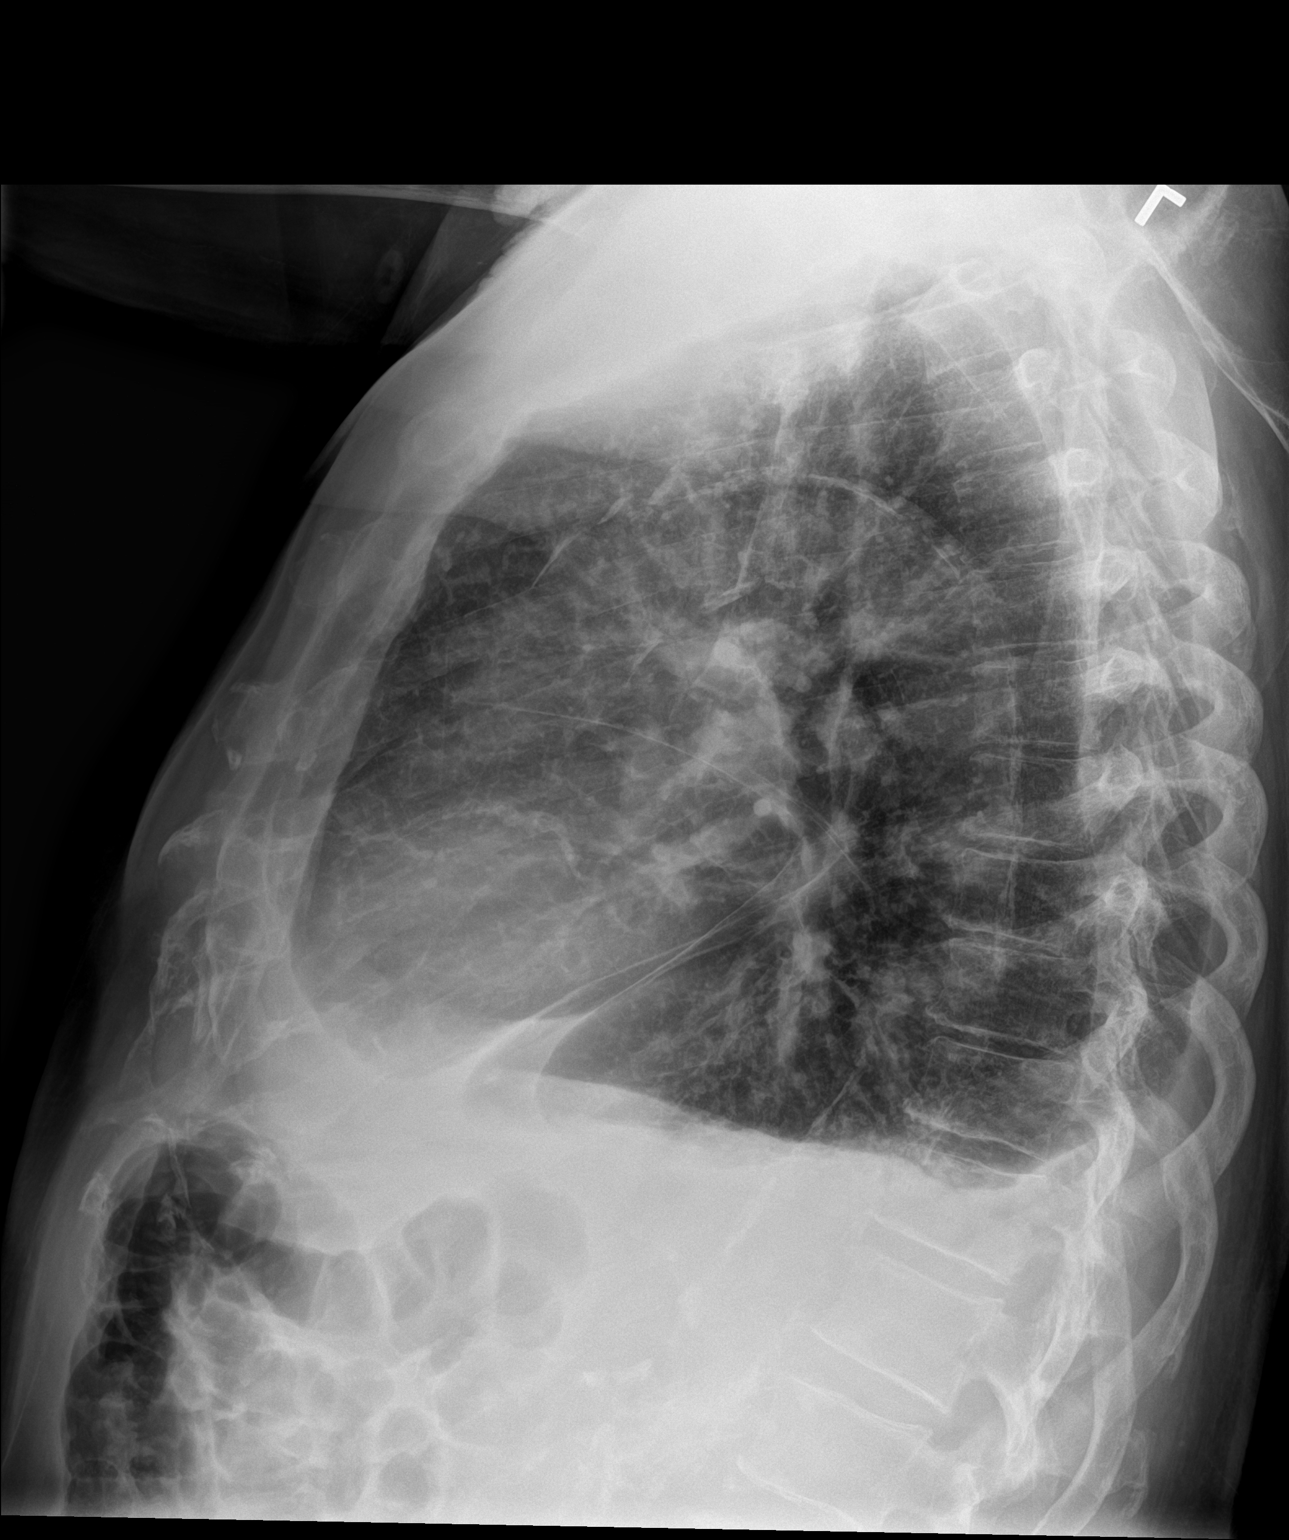

[2 of 2 positions shown; findings below may reference images not displayed]

FINDINGS: There is mild vascular congestion and mild diffuse interstitial
prominence, likely edema. There are small bilateral pleural
effusions with minimal bibasilar atelectasis. Pneumonia is not
excluded. No pneumothorax. The cardiac silhouette is within limits.
Atherosclerotic calcification of the aorta. Coronary vascular stent.
No acute osseous pathology. Degenerative changes of the spine and
shoulders.
IMPRESSION: 1. Mild vascular congestion and interstitial edema.
2. Small bilateral pleural effusions with minimal bibasilar
atelectasis.

## 2021-03-18 MED ORDER — SODIUM CHLORIDE 0.9 % IV SOLN: Freq: Once | INTRAVENOUS | Status: AC

## 2021-03-18 MED ORDER — DIPHENHYDRAMINE HCL 25 MG PO CAPS
50.0000 mg | ORAL_CAPSULE | Freq: Once | ORAL | Status: AC
  Administered 2021-03-18: 10:00:00 50 mg via ORAL
  Filled 2021-03-18: qty 2

## 2021-03-18 MED ORDER — ETOPOSIDE 50 MG PO CAPS
150.0000 mg | ORAL_CAPSULE | Freq: Every day | ORAL | 0 refills | Status: AC
  Filled 2021-03-18: qty 6, 2d supply, fill #0

## 2021-03-18 MED ORDER — SODIUM CHLORIDE 0.9 % IV SOLN
50.0000 mg/m2 | Freq: Once | INTRAVENOUS | Status: AC
  Administered 2021-03-18: 11:00:00 100 mg via INTRAVENOUS
  Filled 2021-03-18: qty 5

## 2021-03-18 MED ORDER — SODIUM CHLORIDE 0.9% FLUSH
10.0000 mL | INTRAVENOUS | Status: DC | PRN
  Administered 2021-03-18: 15:00:00 10 mL

## 2021-03-18 MED ORDER — PALONOSETRON HCL INJECTION 0.25 MG/5ML
0.2500 mg | Freq: Once | INTRAVENOUS | Status: AC
  Administered 2021-03-18: 10:00:00 0.25 mg via INTRAVENOUS
  Filled 2021-03-18: qty 5

## 2021-03-18 MED ORDER — HEPARIN SOD (PORK) LOCK FLUSH 100 UNIT/ML IV SOLN
500.0000 [IU] | Freq: Once | INTRAVENOUS | Status: AC | PRN
  Administered 2021-03-18: 15:00:00 500 [IU]

## 2021-03-18 MED ORDER — PREDNISONE 20 MG PO TABS
60.0000 mg | ORAL_TABLET | Freq: Every day | ORAL | 2 refills | Status: DC
  Filled 2021-03-18: qty 12, 4d supply, fill #0
  Filled 2021-04-09: qty 12, 4d supply, fill #1

## 2021-03-18 MED ORDER — SODIUM CHLORIDE 0.9 % IV SOLN
10.0000 mg | Freq: Once | INTRAVENOUS | Status: AC
  Administered 2021-03-18: 10:00:00 10 mg via INTRAVENOUS
  Filled 2021-03-18: qty 1

## 2021-03-18 MED ORDER — SODIUM CHLORIDE 0.9 % IV SOLN
600.0000 mg/m2 | Freq: Once | INTRAVENOUS | Status: AC
  Administered 2021-03-18: 11:00:00 1160 mg via INTRAVENOUS
  Filled 2021-03-18: qty 58

## 2021-03-18 MED ORDER — SODIUM CHLORIDE 0.9 % IV SOLN
375.0000 mg/m2 | Freq: Once | INTRAVENOUS | Status: AC
  Administered 2021-03-18: 12:00:00 700 mg via INTRAVENOUS
  Filled 2021-03-18: qty 50

## 2021-03-18 MED ORDER — ACETAMINOPHEN 325 MG PO TABS
650.0000 mg | ORAL_TABLET | Freq: Once | ORAL | Status: AC
  Administered 2021-03-18: 10:00:00 650 mg via ORAL
  Filled 2021-03-18: qty 2

## 2021-03-18 NOTE — Progress Notes (Signed)
PET scheduled for 04/02/21 at 1230/1300. Patient notified of appointment and to check in at admitting department. Nothing except water for 6 hours prior to scan.

## 2021-03-18 NOTE — Patient Instructions (Signed)
[place]   Discharge Instructions: Thank you for choosing [facility] to provide your oncology and hematology care.   If you have a lab appointment with the [facility], please go directly to the [facility] and check in at the registration area.   Wear comfortable clothing and clothing appropriate for easy access to any Portacath or PICC line.   We strive to give you quality time with your provider. You may need to reschedule your appointment if you arrive late (15 or more minutes).  Arriving late affects you and other patients whose appointments are after yours.  Also, if you miss three or more appointments without notifying the office, you may be dismissed from the clinic at the providers discretion.      For prescription refill requests, have your pharmacy contact our office and allow 72 hours for refills to be completed.    Today you received the following chemotherapy and/or immunotherapy agents cytoxan, etoposide, rituximab-pvvr      To help prevent nausea and vomiting after your treatment, we encourage you to take your nausea medication as directed.  BELOW ARE SYMPTOMS THAT SHOULD BE REPORTED IMMEDIATELY: *FEVER GREATER THAN 100.4 F (38 C) OR HIGHER *CHILLS OR SWEATING *NAUSEA AND VOMITING THAT IS NOT CONTROLLED WITH YOUR NAUSEA MEDICATION *UNUSUAL SHORTNESS OF BREATH *UNUSUAL BRUISING OR BLEEDING *URINARY PROBLEMS (pain or burning when urinating, or frequent urination) *BOWEL PROBLEMS (unusual diarrhea, constipation, pain near the anus) TENDERNESS IN MOUTH AND THROAT WITH OR WITHOUT PRESENCE OF ULCERS (sore throat, sores in mouth, or a toothache) UNUSUAL RASH, SWELLING OR PAIN  UNUSUAL VAGINAL DISCHARGE OR ITCHING   Items with * indicate a potential emergency and should be followed up as soon as possible or go to the Emergency Department if any problems should occur.  Please show the CHEMOTHERAPY ALERT CARD or IMMUNOTHERAPY  ALERT CARD at check-in to the Emergency Department and triage nurse.  Should you have questions after your visit or need to cancel or reschedule your appointment, please contact [facility]  Dept: [phone]  and follow the prompts.  Office hours are 8:00 a.m. to 4:30 p.m. Monday - Friday. Please note that voicemails left after 4:00 p.m. may not be returned until the following business day.  We are closed weekends and major holidays. You have access to a nurse at all times for urgent questions. Please call the main number to the clinic Dept: [phone] and follow the prompts.   For any non-urgent questions, you may also contact your provider using MyChart. We now offer e-Visits for anyone 60 and older to request care online for non-urgent symptoms. For details visit [email or web address].   Also download the MyChart app! Go to the app store, search "MyChart", open the app, select [facility], and log in with your MyChart username and password.  Due to Covid, a mask is required upon entering the hospital/clinic. If you do not have a mask, one will be given to you upon arrival. For doctor visits, patients may have 1 support person aged 68 or older with them. For treatment visits, patients cannot have anyone with them due to current Covid guidelines and our immunocompromised population.   Cyclophosphamide Injection What is this medication? CYCLOPHOSPHAMIDE (sye kloe FOSS fa mide) is a chemotherapy drug. It slows the growth of cancer cells. This medicine is used to treat many types of cancer like lymphoma, myeloma, leukemia, breast cancer, and ovarian cancer, to name a few.  This medicine may be used for other purposes; ask your health care provider or pharmacist if you have questions. COMMON BRAND NAME(S): Cytoxan, Neosar What should I tell my care team before I take this medication? They need to know if you have any of these conditions: heart disease history of  irregular heartbeat infection kidney disease liver disease low blood counts, like white cells, platelets, or red blood cells on hemodialysis recent or ongoing radiation therapy scarring or thickening of the lungs trouble passing urine an unusual or allergic reaction to cyclophosphamide, other medicines, foods, dyes, or preservatives pregnant or trying to get pregnant breast-feeding How should I use this medication? This drug is usually given as an injection into a vein or muscle or by infusion into a vein. It is administered in a hospital or clinic by a specially trained health care professional. Talk to your pediatrician regarding the use of this medicine in children. Special care may be needed. Overdosage: If you think you have taken too much of this medicine contact a poison control center or emergency room at once. NOTE: This medicine is only for you. Do not share this medicine with others. What if I miss a dose? It is important not to miss your dose. Call your doctor or health care professional if you are unable to keep an appointment. What may interact with this medication? amphotericin B azathioprine certain antivirals for HIV or hepatitis certain medicines for blood pressure, heart disease, irregular heart beat certain medicines that treat or prevent blood clots like warfarin certain other medicines for cancer cyclosporine etanercept indomethacin medicines that relax muscles for surgery medicines to increase blood counts metronidazole This list may not describe all possible interactions. Give your health care provider a list of all the medicines, herbs, non-prescription drugs, or dietary supplements you use. Also tell them if you smoke, drink alcohol, or use illegal drugs. Some items may interact with your medicine. What should I watch for while using this medication? Your condition will be monitored carefully while you are receiving this medicine. You may need blood work  done while you are taking this medicine. Drink water or other fluids as directed. Urinate often, even at night. Some products may contain alcohol. Ask your health care professional if this medicine contains alcohol. Be sure to tell all health care professionals you are taking this medicine. Certain medicines, like metronidazole and disulfiram, can cause an unpleasant reaction when taken with alcohol. The reaction includes flushing, headache, nausea, vomiting, sweating, and increased thirst. The reaction can last from 30 minutes to several hours. Do not become pregnant while taking this medicine or for 1 year after stopping it. Women should inform their health care professional if they wish to become pregnant or think they might be pregnant. Men should not father a child while taking this medicine and for 4 months after stopping it. There is potential for serious side effects to an unborn child. Talk to your health care professional for more information. Do not breast-feed an infant while taking this medicine or for 1 week after stopping it. This medicine has caused ovarian failure in some women. This medicine may make it more difficult to get pregnant. Talk to your health care professional if you are concerned about your fertility. This medicine has caused decreased sperm counts in some men. This may make it more difficult to father a child. Talk to your health care professional if you are concerned about your fertility. Call your health care professional for advice if you  get a fever, chills, or sore throat, or other symptoms of a cold or flu. Do not treat yourself. This medicine decreases your body's ability to fight infections. Try to avoid being around people who are sick. Avoid taking medicines that contain aspirin, acetaminophen, ibuprofen, naproxen, or ketoprofen unless instructed by your health care professional. These medicines may hide a fever. Talk to your health care professional about your risk  of cancer. You may be more at risk for certain types of cancer if you take this medicine. If you are going to need surgery or other procedure, tell your health care professional that you are using this medicine. Be careful brushing or flossing your teeth or using a toothpick because you may get an infection or bleed more easily. If you have any dental work done, tell your dentist you are receiving this medicine. What side effects may I notice from receiving this medication? Side effects that you should report to your doctor or health care professional as soon as possible: allergic reactions like skin rash, itching or hives, swelling of the face, lips, or tongue breathing problems nausea, vomiting signs and symptoms of bleeding such as bloody or black, tarry stools; red or dark brown urine; spitting up blood or brown material that looks like coffee grounds; red spots on the skin; unusual bruising or bleeding from the eyes, gums, or nose signs and symptoms of heart failure like fast, irregular heartbeat, sudden weight gain; swelling of the ankles, feet, hands signs and symptoms of infection like fever; chills; cough; sore throat; pain or trouble passing urine signs and symptoms of kidney injury like trouble passing urine or change in the amount of urine signs and symptoms of liver injury like dark yellow or brown urine; general ill feeling or flu-like symptoms; light-colored stools; loss of appetite; nausea; right upper belly pain; unusually weak or tired; yellowing of the eyes or skin Side effects that usually do not require medical attention (report to your doctor or health care professional if they continue or are bothersome): confusion decreased hearing diarrhea facial flushing hair loss headache loss of appetite missed menstrual periods signs and symptoms of low red blood cells or anemia such as unusually weak or tired; feeling faint or lightheaded; falls skin discoloration This list may  not describe all possible side effects. Call your doctor for medical advice about side effects. You may report side effects to FDA at 1-800-FDA-1088. Where should I keep my medication? This drug is given in a hospital or clinic and will not be stored at home. NOTE: This sheet is a summary. It may not cover all possible information. If you have questions about this medicine, talk to your doctor, pharmacist, or health care provider.  2022 Elsevier/Gold Standard (2020-12-03 00:00:00)  Etoposide, VP-16 injection What is this medication? ETOPOSIDE, VP-16 (e toe POE side) is a chemotherapy drug. It is used to treat testicular cancer, lung cancer, and other cancers. This medicine may be used for other purposes; ask your health care provider or pharmacist if you have questions. COMMON BRAND NAME(S): Etopophos, Toposar, VePesid What should I tell my care team before I take this medication? They need to know if you have any of these conditions: infection kidney disease liver disease low blood counts, like low white cell, platelet, or red cell counts an unusual or allergic reaction to etoposide, other medicines, foods, dyes, or preservatives pregnant or trying to get pregnant breast-feeding How should I use this medication? This medicine is for infusion into a vein.  It is administered in a hospital or clinic by a specially trained health care professional. Talk to your pediatrician regarding the use of this medicine in children. Special care may be needed. Overdosage: If you think you have taken too much of this medicine contact a poison control center or emergency room at once. NOTE: This medicine is only for you. Do not share this medicine with others. What if I miss a dose? It is important not to miss your dose. Call your doctor or health care professional if you are unable to keep an appointment. What may interact with this medication? This medicine may interact with the following  medications: warfarin This list may not describe all possible interactions. Give your health care provider a list of all the medicines, herbs, non-prescription drugs, or dietary supplements you use. Also tell them if you smoke, drink alcohol, or use illegal drugs. Some items may interact with your medicine. What should I watch for while using this medication? Visit your doctor for checks on your progress. This drug may make you feel generally unwell. This is not uncommon, as chemotherapy can affect healthy cells as well as cancer cells. Report any side effects. Continue your course of treatment even though you feel ill unless your doctor tells you to stop. In some cases, you may be given additional medicines to help with side effects. Follow all directions for their use. Call your doctor or health care professional for advice if you get a fever, chills or sore throat, or other symptoms of a cold or flu. Do not treat yourself. This drug decreases your body's ability to fight infections. Try to avoid being around people who are sick. This medicine may increase your risk to bruise or bleed. Call your doctor or health care professional if you notice any unusual bleeding. Talk to your doctor about your risk of cancer. You may be more at risk for certain types of cancers if you take this medicine. Do not become pregnant while taking this medicine or for at least 6 months after stopping it. Women should inform their doctor if they wish to become pregnant or think they might be pregnant. Women of child-bearing potential will need to have a negative pregnancy test before starting this medicine. There is a potential for serious side effects to an unborn child. Talk to your health care professional or pharmacist for more information. Do not breast-feed an infant while taking this medicine. Men must use a latex condom during sexual contact with a woman while taking this medicine and for at least 4 months after  stopping it. A latex condom is needed even if you have had a vasectomy. Contact your doctor right away if your partner becomes pregnant. Do not donate sperm while taking this medicine and for at least 4 months after you stop taking this medicine. Men should inform their doctors if they wish to father a child. This medicine may lower sperm counts. What side effects may I notice from receiving this medication? Side effects that you should report to your doctor or health care professional as soon as possible: allergic reactions like skin rash, itching or hives, swelling of the face, lips, or tongue low blood counts - this medicine may decrease the number of white blood cells, red blood cells, and platelets. You may be at increased risk for infections and bleeding nausea, vomiting redness, blistering, peeling or loosening of the skin, including inside the mouth signs and symptoms of infection like fever; chills; cough; sore throat;  pain or trouble passing urine signs and symptoms of low red blood cells or anemia such as unusually weak or tired; feeling faint or lightheaded; falls; breathing problems unusual bruising or bleeding Side effects that usually do not require medical attention (report to your doctor or health care professional if they continue or are bothersome): changes in taste diarrhea hair loss loss of appetite mouth sores This list may not describe all possible side effects. Call your doctor for medical advice about side effects. You may report side effects to FDA at 1-800-FDA-1088. Where should I keep my medication? This drug is given in a hospital or clinic and will not be stored at home. NOTE: This sheet is a summary. It may not cover all possible information. If you have questions about this medicine, talk to your doctor, pharmacist, or health care provider.  2022 Elsevier/Gold Standard (2020-12-03 00:00:00)  Rituximab Injection What is this medication? RITUXIMAB (ri TUX i mab)  is a monoclonal antibody. It is used to treat certain types of cancer like non-Hodgkin lymphoma and chronic lymphocytic leukemia. It is also used to treat rheumatoid arthritis, granulomatosis with polyangiitis, microscopic polyangiitis, and pemphigus vulgaris. This medicine may be used for other purposes; ask your health care provider or pharmacist if you have questions. COMMON BRAND NAME(S): RIABNI, Rituxan, RUXIENCE What should I tell my care team before I take this medication? They need to know if you have any of these conditions: chest pain heart disease infection especially a viral infection such as chickenpox, cold sores, hepatitis B, or herpes immune system problems irregular heartbeat or rhythm kidney disease low blood counts (white cells, platelets, or red cells) lung disease recent or upcoming vaccine an unusual or allergic reaction to rituximab, other medicines, foods, dyes, or preservatives pregnant or trying to get pregnant breast-feeding How should I use this medication? This medicine is injected into a vein. It is given by a health care provider in a hospital or clinic setting. A special MedGuide will be given to you before each treatment. Be sure to read this information carefully each time. Talk to your health care provider about the use of this medicine in children. While this drug may be prescribed for children as young as 6 months for selected conditions, precautions do apply. Overdosage: If you think you have taken too much of this medicine contact a poison control center or emergency room at once. NOTE: This medicine is only for you. Do not share this medicine with others. What if I miss a dose? Keep appointments for follow-up doses. It is important not to miss your dose. Call your health care provider if you are unable to keep an appointment. What may interact with this medication? Do not take this medicine with any of the following medicines: live vaccines This  medicine may also interact with the following medicines: cisplatin This list may not describe all possible interactions. Give your health care provider a list of all the medicines, herbs, non-prescription drugs, or dietary supplements you use. Also tell them if you smoke, drink alcohol, or use illegal drugs. Some items may interact with your medicine. What should I watch for while using this medication? Your condition will be monitored carefully while you are receiving this medicine. You may need blood work done while you are taking this medicine. This medicine can cause serious infusion reactions. To reduce the risk your health care provider may give you other medicines to take before receiving this one. Be sure to follow the directions from your  health care provider. This medicine may increase your risk of getting an infection. Call your health care provider for advice if you get a fever, chills, sore throat, or other symptoms of a cold or flu. Do not treat yourself. Try to avoid being around people who are sick. Call your health care provider if you are around anyone with measles, chickenpox, or if you develop sores or blisters that do not heal properly. Avoid taking medicines that contain aspirin, acetaminophen, ibuprofen, naproxen, or ketoprofen unless instructed by your health care provider. These medicines may hide a fever. This medicine may cause serious skin reactions. They can happen weeks to months after starting the medicine. Contact your health care provider right away if you notice fevers or flu-like symptoms with a rash. The rash may be red or purple and then turn into blisters or peeling of the skin. Or, you might notice a red rash with swelling of the face, lips or lymph nodes in your neck or under your arms. In some patients, this medicine may cause a serious brain infection that may cause death. If you have any problems seeing, thinking, speaking, walking, or standing, tell your  healthcare professional right away. If you cannot reach your healthcare professional, urgently seek other source of medical care. Do not become pregnant while taking this medicine or for at least 12 months after stopping it. Women should inform their health care provider if they wish to become pregnant or think they might be pregnant. There is potential for serious harm to an unborn child. Talk to your health care provider for more information. Women should use a reliable form of birth control while taking this medicine and for 12 months after stopping it. Do not breast-feed while taking this medicine or for at least 6 months after stopping it. What side effects may I notice from receiving this medication? Side effects that you should report to your health care provider as soon as possible: allergic reactions (skin rash, itching or hives; swelling of the face, lips, or tongue) diarrhea edema (sudden weight gain; swelling of the ankles, feet, hands or other unusual swelling; trouble breathing) fast, irregular heartbeat heart attack (trouble breathing; pain or tightness in the chest, neck, back or arms; unusually weak or tired) infection (fever, chills, cough, sore throat, pain or trouble passing urine) kidney injury (trouble passing urine or change in the amount of urine) liver injury (dark yellow or brown urine; general ill feeling or flu-like symptoms; loss of appetite, right upper belly pain; unusually weak or tired, yellowing of the eyes or skin) low blood pressure (dizziness; feeling faint or lightheaded, falls; unusually weak or tired) low red blood cell counts (trouble breathing; feeling faint; lightheaded, falls; unusually weak or tired) mouth sores redness, blistering, peeling, or loosening of the skin, including inside the mouth stomach pain unusual bruising or bleeding wheezing (trouble breathing with loud or whistling sounds) vomiting Side effects that usually do not require medical  attention (report to your health care provider if they continue or are bothersome): headache joint pain muscle cramps, pain nausea This list may not describe all possible side effects. Call your doctor for medical advice about side effects. You may report side effects to FDA at 1-800-FDA-1088. Where should I keep my medication? This medicine is given in a hospital or clinic. It will not be stored at home. NOTE: This sheet is a summary. It may not cover all possible information. If you have questions about this medicine, talk to your doctor, pharmacist,  or health care provider.  2022 Elsevier/Gold Standard (2020-03-18 00:00:00)

## 2021-03-18 NOTE — Progress Notes (Signed)
Patient seen by Lisa Thomas NP today  Vitals are within treatment parameters.  Labs reviewed by Lisa Thomas NP and are within treatment parameters.  Per physician team, patient is ready for treatment and there are NO modifications to the treatment plan.     

## 2021-03-18 NOTE — Progress Notes (Signed)
Patient presents for treatment. RN assessment completed along with the following:  Labs/vitals reviewed - Yes, and within treatment parameters.   Weight within 10% of previous measurement - Yes Oncology Treatment Attestation completed for current therapy- Yes, on date 01/08/21 Informed consent completed and reflects current therapy/intent - Yes, on date 01/14/21             Provider progress note reviewed - Yes, today's provider note was reviewed. Treatment/Antibody/Supportive plan reviewed - Yes, and there are no adjustments needed for today's treatment. S&H and other orders reviewed - Yes, and there are no additional orders identified. Previous treatment date reviewed - Yes, and the appropriate amount of time has elapsed between treatments.   Patient to proceed with treatment.

## 2021-03-18 NOTE — Progress Notes (Signed)
°  [place] OFFICE PROGRESS NOTE   Diagnosis:  Non-Hodgkin's lymphoma  INTERVAL HISTORY:   [name] [name] returns as scheduled. He completed a cycle of CEOP-Rituxan 02/25/2021.  He denies nausea/vomiting.  No mouth sores.  No diarrhea or constipation.  Stable neuropathy symptoms.  Objective:  Vital signs in last 24 hours:  Blood pressure (!) 124/91, pulse 80, temperature 98.1 F (36.7 C), temperature source Oral, resp. rate 18, height 5\' 8"  (1.727 m), weight 175 lb 12.8 oz (79.7 kg), SpO2 98 %.    HEENT: No thrush or ulcers. Resp: Lungs clear bilaterally. Cardio: Regular rate and rhythm. GI: Abdomen soft and nontender.  No hepatosplenomegaly. Vascular: Trace lower leg edema bilaterally. Neuro: Vibratory sense moderately decreased over the fingertips per tuning fork exam. Port-A-Cath without erythema.  Lab Results:  Lab Results  Component Value Date   WBC 5.5 02/25/2021   HGB 11.8 (L) 02/25/2021   HCT 38.2 (L) 02/25/2021   MCV 79.6 (L) 02/25/2021   PLT 161 02/25/2021   NEUTROABS 3.8 02/25/2021    Imaging:  No results found.  Medications: I have reviewed the patient's current medications.  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 CT abdomen/pelvis 11/18/2020-2.4 x 2.3 segment 7 lesion, multiple additional subcentimeter lesions, some but not all present in 2016 CT chest 11/23/2020-11 mm right retrocrural node, asbestosis related pleural disease CT-guided biopsy of the left periaortic lymph node 12/12/2020-large B-cell lymphoma, CD5 positive, CD20 positive, FISH panel-BCL6 gene rearrangement positive, MYC negative PET 01/01/2021-hypermetabolic left supraclavicular and left upper mediastinal nodes, bulky hypermetabolic periaortic retroperitoneal nodes, single hypermetabolic metastasis in the right liver, multiple hypermetabolic spleen lesions Cycle 1 CEOP-Rituxan 01/14/2021 Cycle 2 CEOP-Rituxan 02/04/2021 Cycle 3 CEOP-Rituxan  02/25/2021 Cycle 4 CEOP-Rituxan 03/18/2021 Iron deficiency anemia Coronary artery disease Peripheral vascular disease CHF Hypertension Carotid artery disease CVA Peripheral neuropathy  Disposition: [name] [name] appears stable.  He has completed 3 cycles of CEOP-Rituxan.  He continues to tolerate treatment well.  Plan to proceed with cycle 4 today as scheduled.  Restaging PET scan prior to next office visit.  CBC and chemistry panel from today reviewed.  Labs adequate to proceed as above.  He will return for follow-up in 3 weeks.  He will contact the office in the interim with any problems.  Patient seen with Dr. Benay Spice.    Ned Card ANP/GNP-BC   03/18/2021  8:24 AM  This was a shared visit with Ned Card.  [name] [name] appears stable.  He will complete another cycle of chemotherapy today.  He appears to be tolerating the treatment well.  We discussed the plan for a restaging PET with [name] [name] and his daughter.  I was present for greater than 50% of today's visit.  I performed medical stage making.  Julieanne Manson, MD

## 2021-03-28 ENCOUNTER — Other Ambulatory Visit (HOSPITAL_COMMUNITY): Payer: Self-pay

## 2021-04-02 ENCOUNTER — Other Ambulatory Visit: Payer: Self-pay | Admitting: Family Medicine

## 2021-04-02 ENCOUNTER — Other Ambulatory Visit: Payer: Self-pay

## 2021-04-02 ENCOUNTER — Ambulatory Visit (HOSPITAL_COMMUNITY)
Admission: RE | Admit: 2021-04-02 | Discharge: 2021-04-02 | Disposition: A | Discharge status: Home / Self Care | Payer: PPO | Source: Ambulatory Visit | Attending: Nurse Practitioner | Admitting: Nurse Practitioner

## 2021-04-02 DIAGNOSIS — C8510 Unspecified B-cell lymphoma, unspecified site: Secondary | ICD-10-CM | POA: Diagnosis not present

## 2021-04-02 DIAGNOSIS — C8330 Diffuse large B-cell lymphoma, unspecified site: Secondary | ICD-10-CM | POA: Diagnosis not present

## 2021-04-02 LAB — GLUCOSE, CAPILLARY: Glucose-Capillary: 108 mg/dL — ABNORMAL HIGH (ref 70–99)

## 2021-04-02 MED ORDER — FLUDEOXYGLUCOSE F - 18 (FDG) INJECTION
8.2000 | Freq: Once | INTRAVENOUS | Status: AC
  Administered 2021-04-02: 9.2 via INTRAVENOUS

## 2021-04-02 MED ORDER — LISINOPRIL 5 MG PO TABS: 5.0000 mg | ORAL_TABLET | Freq: Every day | ORAL | 1 refills | Status: DC

## 2021-04-06 ENCOUNTER — Other Ambulatory Visit: Payer: Self-pay | Admitting: Oncology

## 2021-04-08 ENCOUNTER — Encounter: Payer: Self-pay | Admitting: Oncology

## 2021-04-08 ENCOUNTER — Inpatient Hospital Stay: Payer: PPO | Admitting: Oncology

## 2021-04-08 ENCOUNTER — Other Ambulatory Visit (HOSPITAL_COMMUNITY): Payer: Self-pay

## 2021-04-08 ENCOUNTER — Inpatient Hospital Stay: Payer: PPO

## 2021-04-08 ENCOUNTER — Inpatient Hospital Stay: Payer: PPO | Attending: Oncology

## 2021-04-08 ENCOUNTER — Other Ambulatory Visit: Payer: Self-pay | Admitting: *Deleted

## 2021-04-08 ENCOUNTER — Other Ambulatory Visit: Payer: Self-pay

## 2021-04-08 VITALS — BP 117/83 | HR 73 | Temp 97.7°F | Resp 18 | Ht 68.0 in | Wt 174.6 lb

## 2021-04-08 VITALS — BP 117/79 | HR 67 | Temp 97.8°F | Resp 18

## 2021-04-08 DIAGNOSIS — C8580 Other specified types of non-Hodgkin lymphoma, unspecified site: Secondary | ICD-10-CM

## 2021-04-08 DIAGNOSIS — Z5111 Encounter for antineoplastic chemotherapy: Secondary | ICD-10-CM | POA: Insufficient documentation

## 2021-04-08 DIAGNOSIS — Z5112 Encounter for antineoplastic immunotherapy: Secondary | ICD-10-CM | POA: Insufficient documentation

## 2021-04-08 DIAGNOSIS — C8518 Unspecified B-cell lymphoma, lymph nodes of multiple sites: Secondary | ICD-10-CM | POA: Diagnosis not present

## 2021-04-08 DIAGNOSIS — C8510 Unspecified B-cell lymphoma, unspecified site: Secondary | ICD-10-CM

## 2021-04-08 LAB — CBC WITH DIFFERENTIAL (CANCER CENTER ONLY)
Abs Immature Granulocytes: 0.06 10*3/uL (ref 0.00–0.07)
Basophils Absolute: 0.1 10*3/uL (ref 0.0–0.1)
Basophils Relative: 1 %
Eosinophils Absolute: 0.4 10*3/uL (ref 0.0–0.5)
Eosinophils Relative: 7 %
HCT: 36.7 % — ABNORMAL LOW (ref 39.0–52.0)
Hemoglobin: 11.3 g/dL — ABNORMAL LOW (ref 13.0–17.0)
Immature Granulocytes: 1 %
Lymphocytes Relative: 9 %
Lymphs Abs: 0.5 10*3/uL — ABNORMAL LOW (ref 0.7–4.0)
MCH: 24.9 pg — ABNORMAL LOW (ref 26.0–34.0)
MCHC: 30.8 g/dL (ref 30.0–36.0)
MCV: 80.8 fL (ref 80.0–100.0)
Monocytes Absolute: 1 10*3/uL (ref 0.1–1.0)
Monocytes Relative: 18 %
Neutro Abs: 3.4 10*3/uL (ref 1.7–7.7)
Neutrophils Relative %: 64 %
Platelet Count: 157 10*3/uL (ref 150–400)
RBC: 4.54 MIL/uL (ref 4.22–5.81)
RDW: 18.3 % — ABNORMAL HIGH (ref 11.5–15.5)
WBC Count: 5.3 10*3/uL (ref 4.0–10.5)
nRBC: 0 % (ref 0.0–0.2)

## 2021-04-08 LAB — CMP (CANCER CENTER ONLY)
ALT: 8 U/L (ref 0–44)
AST: 15 U/L (ref 15–41)
Albumin: 4 g/dL (ref 3.5–5.0)
Alkaline Phosphatase: 61 U/L (ref 38–126)
Anion gap: 8 (ref 5–15)
BUN: 23 mg/dL (ref 8–23)
CO2: 26 mmol/L (ref 22–32)
Calcium: 8.7 mg/dL — ABNORMAL LOW (ref 8.9–10.3)
Chloride: 104 mmol/L (ref 98–111)
Creatinine: 1.29 mg/dL — ABNORMAL HIGH (ref 0.61–1.24)
GFR, Estimated: 55 mL/min — ABNORMAL LOW (ref >60)
Glucose, Bld: 128 mg/dL — ABNORMAL HIGH (ref 70–99)
Potassium: 4.4 mmol/L (ref 3.5–5.1)
Sodium: 138 mmol/L (ref 135–145)
Total Bilirubin: 0.5 mg/dL (ref 0.3–1.2)
Total Protein: 6.4 g/dL — ABNORMAL LOW (ref 6.5–8.1)

## 2021-04-08 LAB — LACTATE DEHYDROGENASE: LDH: 254 U/L — ABNORMAL HIGH (ref 98–192)

## 2021-04-08 MED ORDER — SODIUM CHLORIDE 0.9 % IV SOLN
10.0000 mg | Freq: Once | INTRAVENOUS | Status: AC
  Administered 2021-04-08: 10 mg via INTRAVENOUS
  Filled 2021-04-08: qty 10

## 2021-04-08 MED ORDER — PALONOSETRON HCL INJECTION 0.25 MG/5ML
0.2500 mg | Freq: Once | INTRAVENOUS | Status: AC
  Administered 2021-04-08: 0.25 mg via INTRAVENOUS
  Filled 2021-04-08: qty 5

## 2021-04-08 MED ORDER — ETOPOSIDE 50 MG PO CAPS
150.0000 mg | ORAL_CAPSULE | Freq: Every day | ORAL | 0 refills | Status: DC
  Filled 2021-04-08 (×2): qty 6, 2d supply, fill #0
  Filled 2021-04-08: qty 6, 21d supply, fill #0

## 2021-04-08 MED ORDER — ACETAMINOPHEN 325 MG PO TABS
650.0000 mg | ORAL_TABLET | Freq: Once | ORAL | Status: AC
  Administered 2021-04-08: 650 mg via ORAL
  Filled 2021-04-08: qty 2

## 2021-04-08 MED ORDER — SODIUM CHLORIDE 0.9 % IV SOLN
50.0000 mg/m2 | Freq: Once | INTRAVENOUS | Status: AC
  Administered 2021-04-08: 100 mg via INTRAVENOUS
  Filled 2021-04-08: qty 5

## 2021-04-08 MED ORDER — SODIUM CHLORIDE 0.9% FLUSH
10.0000 mL | INTRAVENOUS | Status: DC | PRN
  Administered 2021-04-08: 10 mL

## 2021-04-08 MED ORDER — DIPHENHYDRAMINE HCL 25 MG PO CAPS
50.0000 mg | ORAL_CAPSULE | Freq: Once | ORAL | Status: AC
  Administered 2021-04-08: 50 mg via ORAL
  Filled 2021-04-08: qty 2

## 2021-04-08 MED ORDER — HEPARIN SOD (PORK) LOCK FLUSH 100 UNIT/ML IV SOLN
500.0000 [IU] | Freq: Once | INTRAVENOUS | Status: AC | PRN
  Administered 2021-04-08: 500 [IU]

## 2021-04-08 MED ORDER — SODIUM CHLORIDE 0.9 % IV SOLN
375.0000 mg/m2 | Freq: Once | INTRAVENOUS | Status: AC
  Administered 2021-04-08: 700 mg via INTRAVENOUS
  Filled 2021-04-08: qty 20

## 2021-04-08 MED ORDER — SODIUM CHLORIDE 0.9 % IV SOLN
600.0000 mg/m2 | Freq: Once | INTRAVENOUS | Status: AC
  Administered 2021-04-08: 1160 mg via INTRAVENOUS
  Filled 2021-04-08: qty 58

## 2021-04-08 MED ORDER — SODIUM CHLORIDE 0.9 % IV SOLN: Freq: Once | INTRAVENOUS | Status: AC

## 2021-04-08 NOTE — Patient Instructions (Signed)

## 2021-04-08 NOTE — Progress Notes (Signed)
Refilled Etoposide to [facility] per MD request.

## 2021-04-08 NOTE — Progress Notes (Signed)
Patient presents for treatment. RN assessment completed along with the following:  Labs/vitals reviewed - Yes, and within treatment parameters.   Weight within 10% of previous measurement - Yes Oncology Treatment Attestation completed for current therapy- Yes, on date 01/08/21 Informed consent completed and reflects current therapy/intent - Yes, on date 01/14/21             Provider progress note reviewed - Today's provider note is not yet available. I reviewed the most recent oncology provider progress note in chart dated 03/18/21. Treatment/Antibody/Supportive plan reviewed - Yes, and there are no adjustments needed for today's treatment. S&H and other orders reviewed - Yes, and there are no additional orders identified. Previous treatment date reviewed - Yes, and the appropriate amount of time has elapsed between treatments. Clinic Hand Off Received from - Merceda Elks, RN  Patient to proceed with treatment.

## 2021-04-08 NOTE — Progress Notes (Signed)
Patient seen by Dr. Sherrill today ? ?Vitals are within treatment parameters. ? ?Labs reviewed by Dr. Sherrill and are within treatment parameters. ? ?Per physician team, patient is ready for treatment and there are NO modifications to the treatment plan.  ?

## 2021-04-08 NOTE — Progress Notes (Signed)
°  [place] OFFICE PROGRESS NOTE   Diagnosis: Non-Hodgkin's lymphoma  INTERVAL HISTORY:   [name] [name] completed another cycle of chemotherapy beginning 02/25/2021.  No nausea/vomiting, mouth sores, or rash.  Good appetite.  He has chronic exertional dyspnea.  No new complaint.  Objective:  Vital signs in last 24 hours:  Blood pressure 117/83, pulse 73, temperature 97.7 F (36.5 C), temperature source Oral, resp. rate 18, height 5\' 8"  (1.727 m), weight 174 lb 9.6 oz (79.2 kg), SpO2 98 %.    HEENT: Small ecchymosis of the left buccal mucosa, no thrush Resp: End inspiratory rhonchi at the left posterior base, no respiratory distress Cardio: Regular rate and rhythm GI: No hepatosplenomegaly Vascular: Trace pretibial and ankle edema at the right greater than left lower leg, no erythema or tenderness Skin: Palms without erythema  Portacath/PICC-without erythema  Lab Results:  Lab Results  Component Value Date   WBC 5.3 04/08/2021   HGB 11.3 (L) 04/08/2021   HCT 36.7 (L) 04/08/2021   MCV 80.8 04/08/2021   PLT 157 04/08/2021   NEUTROABS 3.4 04/08/2021    CMP  Lab Results  Component Value Date   NA 138 04/08/2021   K 4.4 04/08/2021   CL 104 04/08/2021   CO2 26 04/08/2021   GLUCOSE 128 (H) 04/08/2021   BUN 23 04/08/2021   CREATININE 1.29 (H) 04/08/2021   CALCIUM 8.7 (L) 04/08/2021   PROT 6.4 (L) 04/08/2021   ALBUMIN 4.0 04/08/2021   AST 15 04/08/2021   ALT 8 04/08/2021   ALKPHOS 61 04/08/2021   BILITOT 0.5 04/08/2021   GFRNONAA 55 (L) 04/08/2021   GFRAA 59 (L) 03/26/2020     Medications: I have reviewed the patient's current medications.   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 CT abdomen/pelvis 11/18/2020-2.4 x 2.3 segment 7 lesion, multiple additional subcentimeter lesions, some but not all present in 2016 CT chest 11/23/2020-11 mm right retrocrural node, asbestosis related pleural disease CT-guided biopsy  of the left periaortic lymph node 12/12/2020-large B-cell lymphoma, CD5 positive, CD20 positive, FISH panel-BCL6 gene rearrangement positive, MYC negative PET 01/01/2021-hypermetabolic left supraclavicular and left upper mediastinal nodes, bulky hypermetabolic periaortic retroperitoneal nodes, single hypermetabolic metastasis in the right liver, multiple hypermetabolic spleen lesions Cycle 1 CEOP-Rituxan 01/14/2021 Cycle 2 CEOP-Rituxan 02/04/2021 Cycle 3 CEOP-Rituxan 02/25/2021 Cycle 4 CEOP-Rituxan 03/18/2021 PET 04/02/2021-resolution of lymphadenopathy in the left neck, supraclavicular region, left mediastinum.  Resolution of hypermetabolic abdominal lymphadenopathy and hypermetabolic disease posterior to the liver and spleen, no new or progressive hypermetabolism, stable 4 cm abdominal aortic aneurysm Cycle 5 CEOP-Rituxan 04/08/2021 Iron deficiency anemia Coronary artery disease Peripheral vascular disease CHF Hypertension Carotid artery disease CVA Peripheral neuropathy    Disposition: [name] [name] has completed 4 cycles of systemic therapy.  He has tolerated the treatment well.  The restaging PET scan is consistent with a complete response.  I reviewed the CT/PET images with [name] [name] and his daughter.  We discussed the imaging findings.  The plan is to complete another cycle today.  He will return for an office visit and chemotherapy in 3 weeks.  The plan is to complete a total of 6 cycles of systemic therapy followed by observation.  Betsy Coder, MD  04/08/2021  9:36 AM

## 2021-04-08 NOTE — Patient Instructions (Signed)
[place]   Discharge Instructions: Thank you for choosing [facility] to provide your oncology and hematology care.   If you have a lab appointment with the [facility], please go directly to the [facility] and check in at the registration area.   Wear comfortable clothing and clothing appropriate for easy access to any Portacath or PICC line.   We strive to give you quality time with your provider. You may need to reschedule your appointment if you arrive late (15 or more minutes).  Arriving late affects you and other patients whose appointments are after yours.  Also, if you miss three or more appointments without notifying the office, you may be dismissed from the clinic at the providers discretion.      For prescription refill requests, have your pharmacy contact our office and allow 72 hours for refills to be completed.    Today you received the following chemotherapy and/or immunotherapy agents cytoxan, etoposide, rituximab-pvvr      To help prevent nausea and vomiting after your treatment, we encourage you to take your nausea medication as directed.  BELOW ARE SYMPTOMS THAT SHOULD BE REPORTED IMMEDIATELY: *FEVER GREATER THAN 100.4 F (38 C) OR HIGHER *CHILLS OR SWEATING *NAUSEA AND VOMITING THAT IS NOT CONTROLLED WITH YOUR NAUSEA MEDICATION *UNUSUAL SHORTNESS OF BREATH *UNUSUAL BRUISING OR BLEEDING *URINARY PROBLEMS (pain or burning when urinating, or frequent urination) *BOWEL PROBLEMS (unusual diarrhea, constipation, pain near the anus) TENDERNESS IN MOUTH AND THROAT WITH OR WITHOUT PRESENCE OF ULCERS (sore throat, sores in mouth, or a toothache) UNUSUAL RASH, SWELLING OR PAIN  UNUSUAL VAGINAL DISCHARGE OR ITCHING   Items with * indicate a potential emergency and should be followed up as soon as possible or go to the Emergency Department if any problems should occur.  Please show the CHEMOTHERAPY ALERT CARD or IMMUNOTHERAPY  ALERT CARD at check-in to the Emergency Department and triage nurse.  Should you have questions after your visit or need to cancel or reschedule your appointment, please contact [facility]  Dept: [phone]  and follow the prompts.  Office hours are 8:00 a.m. to 4:30 p.m. Monday - Friday. Please note that voicemails left after 4:00 p.m. may not be returned until the following business day.  We are closed weekends and major holidays. You have access to a nurse at all times for urgent questions. Please call the main number to the clinic Dept: [phone] and follow the prompts.   For any non-urgent questions, you may also contact your provider using MyChart. We now offer e-Visits for anyone 57 and older to request care online for non-urgent symptoms. For details visit [email or web address].   Also download the MyChart app! Go to the app store, search "MyChart", open the app, select [facility], and log in with your MyChart username and password.  Due to Covid, a mask is required upon entering the hospital/clinic. If you do not have a mask, one will be given to you upon arrival. For doctor visits, patients may have 1 support person aged 24 or older with them. For treatment visits, patients cannot have anyone with them due to current Covid guidelines and our immunocompromised population.   Cyclophosphamide Injection What is this medication? CYCLOPHOSPHAMIDE (sye kloe FOSS fa mide) is a chemotherapy drug. It slows the growth of cancer cells. This medicine is used to treat many types of cancer like lymphoma, myeloma, leukemia, breast cancer, and ovarian cancer, to name a few.  This medicine may be used for other purposes; ask your health care provider or pharmacist if you have questions. COMMON BRAND NAME(S): Cytoxan, Neosar What should I tell my care team before I take this medication? They need to know if you have any of these conditions: heart disease history of  irregular heartbeat infection kidney disease liver disease low blood counts, like white cells, platelets, or red blood cells on hemodialysis recent or ongoing radiation therapy scarring or thickening of the lungs trouble passing urine an unusual or allergic reaction to cyclophosphamide, other medicines, foods, dyes, or preservatives pregnant or trying to get pregnant breast-feeding How should I use this medication? This drug is usually given as an injection into a vein or muscle or by infusion into a vein. It is administered in a hospital or clinic by a specially trained health care professional. Talk to your pediatrician regarding the use of this medicine in children. Special care may be needed. Overdosage: If you think you have taken too much of this medicine contact a poison control center or emergency room at once. NOTE: This medicine is only for you. Do not share this medicine with others. What if I miss a dose? It is important not to miss your dose. Call your doctor or health care professional if you are unable to keep an appointment. What may interact with this medication? amphotericin B azathioprine certain antivirals for HIV or hepatitis certain medicines for blood pressure, heart disease, irregular heart beat certain medicines that treat or prevent blood clots like warfarin certain other medicines for cancer cyclosporine etanercept indomethacin medicines that relax muscles for surgery medicines to increase blood counts metronidazole This list may not describe all possible interactions. Give your health care provider a list of all the medicines, herbs, non-prescription drugs, or dietary supplements you use. Also tell them if you smoke, drink alcohol, or use illegal drugs. Some items may interact with your medicine. What should I watch for while using this medication? Your condition will be monitored carefully while you are receiving this medicine. You may need blood work  done while you are taking this medicine. Drink water or other fluids as directed. Urinate often, even at night. Some products may contain alcohol. Ask your health care professional if this medicine contains alcohol. Be sure to tell all health care professionals you are taking this medicine. Certain medicines, like metronidazole and disulfiram, can cause an unpleasant reaction when taken with alcohol. The reaction includes flushing, headache, nausea, vomiting, sweating, and increased thirst. The reaction can last from 30 minutes to several hours. Do not become pregnant while taking this medicine or for 1 year after stopping it. Women should inform their health care professional if they wish to become pregnant or think they might be pregnant. Men should not father a child while taking this medicine and for 4 months after stopping it. There is potential for serious side effects to an unborn child. Talk to your health care professional for more information. Do not breast-feed an infant while taking this medicine or for 1 week after stopping it. This medicine has caused ovarian failure in some women. This medicine may make it more difficult to get pregnant. Talk to your health care professional if you are concerned about your fertility. This medicine has caused decreased sperm counts in some men. This may make it more difficult to father a child. Talk to your health care professional if you are concerned about your fertility. Call your health care professional for advice if you  get a fever, chills, or sore throat, or other symptoms of a cold or flu. Do not treat yourself. This medicine decreases your body's ability to fight infections. Try to avoid being around people who are sick. Avoid taking medicines that contain aspirin, acetaminophen, ibuprofen, naproxen, or ketoprofen unless instructed by your health care professional. These medicines may hide a fever. Talk to your health care professional about your risk  of cancer. You may be more at risk for certain types of cancer if you take this medicine. If you are going to need surgery or other procedure, tell your health care professional that you are using this medicine. Be careful brushing or flossing your teeth or using a toothpick because you may get an infection or bleed more easily. If you have any dental work done, tell your dentist you are receiving this medicine. What side effects may I notice from receiving this medication? Side effects that you should report to your doctor or health care professional as soon as possible: allergic reactions like skin rash, itching or hives, swelling of the face, lips, or tongue breathing problems nausea, vomiting signs and symptoms of bleeding such as bloody or black, tarry stools; red or dark brown urine; spitting up blood or brown material that looks like coffee grounds; red spots on the skin; unusual bruising or bleeding from the eyes, gums, or nose signs and symptoms of heart failure like fast, irregular heartbeat, sudden weight gain; swelling of the ankles, feet, hands signs and symptoms of infection like fever; chills; cough; sore throat; pain or trouble passing urine signs and symptoms of kidney injury like trouble passing urine or change in the amount of urine signs and symptoms of liver injury like dark yellow or brown urine; general ill feeling or flu-like symptoms; light-colored stools; loss of appetite; nausea; right upper belly pain; unusually weak or tired; yellowing of the eyes or skin Side effects that usually do not require medical attention (report to your doctor or health care professional if they continue or are bothersome): confusion decreased hearing diarrhea facial flushing hair loss headache loss of appetite missed menstrual periods signs and symptoms of low red blood cells or anemia such as unusually weak or tired; feeling faint or lightheaded; falls skin discoloration This list may  not describe all possible side effects. Call your doctor for medical advice about side effects. You may report side effects to FDA at 1-800-FDA-1088. Where should I keep my medication? This drug is given in a hospital or clinic and will not be stored at home. NOTE: This sheet is a summary. It may not cover all possible information. If you have questions about this medicine, talk to your doctor, pharmacist, or health care provider.  2022 Elsevier/Gold Standard (2020-12-03 00:00:00)  Etoposide, VP-16 injection What is this medication? ETOPOSIDE, VP-16 (e toe POE side) is a chemotherapy drug. It is used to treat testicular cancer, lung cancer, and other cancers. This medicine may be used for other purposes; ask your health care provider or pharmacist if you have questions. COMMON BRAND NAME(S): Etopophos, Toposar, VePesid What should I tell my care team before I take this medication? They need to know if you have any of these conditions: infection kidney disease liver disease low blood counts, like low white cell, platelet, or red cell counts an unusual or allergic reaction to etoposide, other medicines, foods, dyes, or preservatives pregnant or trying to get pregnant breast-feeding How should I use this medication? This medicine is for infusion into a vein.  It is administered in a hospital or clinic by a specially trained health care professional. Talk to your pediatrician regarding the use of this medicine in children. Special care may be needed. Overdosage: If you think you have taken too much of this medicine contact a poison control center or emergency room at once. NOTE: This medicine is only for you. Do not share this medicine with others. What if I miss a dose? It is important not to miss your dose. Call your doctor or health care professional if you are unable to keep an appointment. What may interact with this medication? This medicine may interact with the following  medications: warfarin This list may not describe all possible interactions. Give your health care provider a list of all the medicines, herbs, non-prescription drugs, or dietary supplements you use. Also tell them if you smoke, drink alcohol, or use illegal drugs. Some items may interact with your medicine. What should I watch for while using this medication? Visit your doctor for checks on your progress. This drug may make you feel generally unwell. This is not uncommon, as chemotherapy can affect healthy cells as well as cancer cells. Report any side effects. Continue your course of treatment even though you feel ill unless your doctor tells you to stop. In some cases, you may be given additional medicines to help with side effects. Follow all directions for their use. Call your doctor or health care professional for advice if you get a fever, chills or sore throat, or other symptoms of a cold or flu. Do not treat yourself. This drug decreases your body's ability to fight infections. Try to avoid being around people who are sick. This medicine may increase your risk to bruise or bleed. Call your doctor or health care professional if you notice any unusual bleeding. Talk to your doctor about your risk of cancer. You may be more at risk for certain types of cancers if you take this medicine. Do not become pregnant while taking this medicine or for at least 6 months after stopping it. Women should inform their doctor if they wish to become pregnant or think they might be pregnant. Women of child-bearing potential will need to have a negative pregnancy test before starting this medicine. There is a potential for serious side effects to an unborn child. Talk to your health care professional or pharmacist for more information. Do not breast-feed an infant while taking this medicine. Men must use a latex condom during sexual contact with a woman while taking this medicine and for at least 4 months after  stopping it. A latex condom is needed even if you have had a vasectomy. Contact your doctor right away if your partner becomes pregnant. Do not donate sperm while taking this medicine and for at least 4 months after you stop taking this medicine. Men should inform their doctors if they wish to father a child. This medicine may lower sperm counts. What side effects may I notice from receiving this medication? Side effects that you should report to your doctor or health care professional as soon as possible: allergic reactions like skin rash, itching or hives, swelling of the face, lips, or tongue low blood counts - this medicine may decrease the number of white blood cells, red blood cells, and platelets. You may be at increased risk for infections and bleeding nausea, vomiting redness, blistering, peeling or loosening of the skin, including inside the mouth signs and symptoms of infection like fever; chills; cough; sore throat;  pain or trouble passing urine signs and symptoms of low red blood cells or anemia such as unusually weak or tired; feeling faint or lightheaded; falls; breathing problems unusual bruising or bleeding Side effects that usually do not require medical attention (report to your doctor or health care professional if they continue or are bothersome): changes in taste diarrhea hair loss loss of appetite mouth sores This list may not describe all possible side effects. Call your doctor for medical advice about side effects. You may report side effects to FDA at 1-800-FDA-1088. Where should I keep my medication? This drug is given in a hospital or clinic and will not be stored at home. NOTE: This sheet is a summary. It may not cover all possible information. If you have questions about this medicine, talk to your doctor, pharmacist, or health care provider.  2022 Elsevier/Gold Standard (2020-12-03 00:00:00)  Rituximab Injection What is this medication? RITUXIMAB (ri TUX i mab)  is a monoclonal antibody. It is used to treat certain types of cancer like non-Hodgkin lymphoma and chronic lymphocytic leukemia. It is also used to treat rheumatoid arthritis, granulomatosis with polyangiitis, microscopic polyangiitis, and pemphigus vulgaris. This medicine may be used for other purposes; ask your health care provider or pharmacist if you have questions. COMMON BRAND NAME(S): RIABNI, Rituxan, RUXIENCE What should I tell my care team before I take this medication? They need to know if you have any of these conditions: chest pain heart disease infection especially a viral infection such as chickenpox, cold sores, hepatitis B, or herpes immune system problems irregular heartbeat or rhythm kidney disease low blood counts (white cells, platelets, or red cells) lung disease recent or upcoming vaccine an unusual or allergic reaction to rituximab, other medicines, foods, dyes, or preservatives pregnant or trying to get pregnant breast-feeding How should I use this medication? This medicine is injected into a vein. It is given by a health care provider in a hospital or clinic setting. A special MedGuide will be given to you before each treatment. Be sure to read this information carefully each time. Talk to your health care provider about the use of this medicine in children. While this drug may be prescribed for children as young as 6 months for selected conditions, precautions do apply. Overdosage: If you think you have taken too much of this medicine contact a poison control center or emergency room at once. NOTE: This medicine is only for you. Do not share this medicine with others. What if I miss a dose? Keep appointments for follow-up doses. It is important not to miss your dose. Call your health care provider if you are unable to keep an appointment. What may interact with this medication? Do not take this medicine with any of the following medicines: live vaccines This  medicine may also interact with the following medicines: cisplatin This list may not describe all possible interactions. Give your health care provider a list of all the medicines, herbs, non-prescription drugs, or dietary supplements you use. Also tell them if you smoke, drink alcohol, or use illegal drugs. Some items may interact with your medicine. What should I watch for while using this medication? Your condition will be monitored carefully while you are receiving this medicine. You may need blood work done while you are taking this medicine. This medicine can cause serious infusion reactions. To reduce the risk your health care provider may give you other medicines to take before receiving this one. Be sure to follow the directions from your  health care provider. This medicine may increase your risk of getting an infection. Call your health care provider for advice if you get a fever, chills, sore throat, or other symptoms of a cold or flu. Do not treat yourself. Try to avoid being around people who are sick. Call your health care provider if you are around anyone with measles, chickenpox, or if you develop sores or blisters that do not heal properly. Avoid taking medicines that contain aspirin, acetaminophen, ibuprofen, naproxen, or ketoprofen unless instructed by your health care provider. These medicines may hide a fever. This medicine may cause serious skin reactions. They can happen weeks to months after starting the medicine. Contact your health care provider right away if you notice fevers or flu-like symptoms with a rash. The rash may be red or purple and then turn into blisters or peeling of the skin. Or, you might notice a red rash with swelling of the face, lips or lymph nodes in your neck or under your arms. In some patients, this medicine may cause a serious brain infection that may cause death. If you have any problems seeing, thinking, speaking, walking, or standing, tell your  healthcare professional right away. If you cannot reach your healthcare professional, urgently seek other source of medical care. Do not become pregnant while taking this medicine or for at least 12 months after stopping it. Women should inform their health care provider if they wish to become pregnant or think they might be pregnant. There is potential for serious harm to an unborn child. Talk to your health care provider for more information. Women should use a reliable form of birth control while taking this medicine and for 12 months after stopping it. Do not breast-feed while taking this medicine or for at least 6 months after stopping it. What side effects may I notice from receiving this medication? Side effects that you should report to your health care provider as soon as possible: allergic reactions (skin rash, itching or hives; swelling of the face, lips, or tongue) diarrhea edema (sudden weight gain; swelling of the ankles, feet, hands or other unusual swelling; trouble breathing) fast, irregular heartbeat heart attack (trouble breathing; pain or tightness in the chest, neck, back or arms; unusually weak or tired) infection (fever, chills, cough, sore throat, pain or trouble passing urine) kidney injury (trouble passing urine or change in the amount of urine) liver injury (dark yellow or brown urine; general ill feeling or flu-like symptoms; loss of appetite, right upper belly pain; unusually weak or tired, yellowing of the eyes or skin) low blood pressure (dizziness; feeling faint or lightheaded, falls; unusually weak or tired) low red blood cell counts (trouble breathing; feeling faint; lightheaded, falls; unusually weak or tired) mouth sores redness, blistering, peeling, or loosening of the skin, including inside the mouth stomach pain unusual bruising or bleeding wheezing (trouble breathing with loud or whistling sounds) vomiting Side effects that usually do not require medical  attention (report to your health care provider if they continue or are bothersome): headache joint pain muscle cramps, pain nausea This list may not describe all possible side effects. Call your doctor for medical advice about side effects. You may report side effects to FDA at 1-800-FDA-1088. Where should I keep my medication? This medicine is given in a hospital or clinic. It will not be stored at home. NOTE: This sheet is a summary. It may not cover all possible information. If you have questions about this medicine, talk to your doctor, pharmacist,  or health care provider.  2022 Elsevier/Gold Standard (2020-03-18 00:00:00)

## 2021-04-09 ENCOUNTER — Encounter: Payer: Self-pay | Admitting: Oncology

## 2021-04-09 ENCOUNTER — Other Ambulatory Visit (HOSPITAL_COMMUNITY): Payer: Self-pay

## 2021-04-10 ENCOUNTER — Other Ambulatory Visit (HOSPITAL_COMMUNITY): Payer: Self-pay

## 2021-04-15 ENCOUNTER — Other Ambulatory Visit (HOSPITAL_COMMUNITY): Payer: Self-pay

## 2021-04-22 ENCOUNTER — Other Ambulatory Visit (HOSPITAL_COMMUNITY): Payer: Self-pay

## 2021-04-24 ENCOUNTER — Other Ambulatory Visit (HOSPITAL_COMMUNITY): Payer: Self-pay

## 2021-04-27 ENCOUNTER — Other Ambulatory Visit: Payer: Self-pay | Admitting: Oncology

## 2021-04-28 ENCOUNTER — Telehealth: Payer: Self-pay | Admitting: *Deleted

## 2021-04-28 ENCOUNTER — Other Ambulatory Visit (HOSPITAL_COMMUNITY): Payer: Self-pay

## 2021-04-28 ENCOUNTER — Other Ambulatory Visit: Payer: Self-pay | Admitting: Family Medicine

## 2021-04-28 NOTE — Telephone Encounter (Signed)
Patient is requesting refill on tramadol called into [facility]

## 2021-04-28 NOTE — Telephone Encounter (Signed)
Wife had called answering service over weekend for reported SOB w/exertion in the morning and they were anxious. Was told to go to ER for evaluation, but patient declined. Today, he is at his baseline. Does have some sinus drainage. No fever or swelling or chest pain. Will f/u tomorrow in office as scheduled.

## 2021-04-29 ENCOUNTER — Inpatient Hospital Stay (HOSPITAL_BASED_OUTPATIENT_CLINIC_OR_DEPARTMENT_OTHER): Payer: PPO | Admitting: Oncology

## 2021-04-29 ENCOUNTER — Inpatient Hospital Stay: Payer: PPO

## 2021-04-29 ENCOUNTER — Other Ambulatory Visit: Payer: Self-pay

## 2021-04-29 ENCOUNTER — Other Ambulatory Visit: Payer: Self-pay | Admitting: Oncology

## 2021-04-29 ENCOUNTER — Other Ambulatory Visit (HOSPITAL_COMMUNITY): Payer: Self-pay

## 2021-04-29 VITALS — BP 122/80 | HR 83 | Temp 98.0°F | Resp 18 | Ht 68.0 in | Wt 172.0 lb

## 2021-04-29 VITALS — BP 111/80 | HR 80 | Temp 98.5°F | Resp 18

## 2021-04-29 DIAGNOSIS — C8580 Other specified types of non-Hodgkin lymphoma, unspecified site: Secondary | ICD-10-CM

## 2021-04-29 DIAGNOSIS — C8510 Unspecified B-cell lymphoma, unspecified site: Secondary | ICD-10-CM

## 2021-04-29 DIAGNOSIS — Z5112 Encounter for antineoplastic immunotherapy: Secondary | ICD-10-CM | POA: Diagnosis not present

## 2021-04-29 LAB — CMP (CANCER CENTER ONLY)
ALT: 14 U/L (ref 0–44)
AST: 18 U/L (ref 15–41)
Albumin: 3.6 g/dL (ref 3.5–5.0)
Alkaline Phosphatase: 53 U/L (ref 38–126)
Anion gap: 10 (ref 5–15)
BUN: 27 mg/dL — ABNORMAL HIGH (ref 8–23)
CO2: 24 mmol/L (ref 22–32)
Calcium: 8.3 mg/dL — ABNORMAL LOW (ref 8.9–10.3)
Chloride: 105 mmol/L (ref 98–111)
Creatinine: 1.35 mg/dL — ABNORMAL HIGH (ref 0.61–1.24)
GFR, Estimated: 52 mL/min — ABNORMAL LOW (ref >60)
Glucose, Bld: 111 mg/dL — ABNORMAL HIGH (ref 70–99)
Potassium: 4.3 mmol/L (ref 3.5–5.1)
Sodium: 139 mmol/L (ref 135–145)
Total Bilirubin: 0.4 mg/dL (ref 0.3–1.2)
Total Protein: 6.1 g/dL — ABNORMAL LOW (ref 6.5–8.1)

## 2021-04-29 LAB — CBC WITH DIFFERENTIAL (CANCER CENTER ONLY)
Abs Immature Granulocytes: 0.04 10*3/uL (ref 0.00–0.07)
Basophils Absolute: 0.1 10*3/uL (ref 0.0–0.1)
Basophils Relative: 1 %
Eosinophils Absolute: 0.9 10*3/uL — ABNORMAL HIGH (ref 0.0–0.5)
Eosinophils Relative: 15 %
HCT: 33.2 % — ABNORMAL LOW (ref 39.0–52.0)
Hemoglobin: 10.3 g/dL — ABNORMAL LOW (ref 13.0–17.0)
Immature Granulocytes: 1 %
Lymphocytes Relative: 7 %
Lymphs Abs: 0.4 10*3/uL — ABNORMAL LOW (ref 0.7–4.0)
MCH: 24.8 pg — ABNORMAL LOW (ref 26.0–34.0)
MCHC: 31 g/dL (ref 30.0–36.0)
MCV: 80 fL (ref 80.0–100.0)
Monocytes Absolute: 1 10*3/uL (ref 0.1–1.0)
Monocytes Relative: 16 %
Neutro Abs: 3.6 10*3/uL (ref 1.7–7.7)
Neutrophils Relative %: 60 %
Platelet Count: 178 10*3/uL (ref 150–400)
RBC: 4.15 MIL/uL — ABNORMAL LOW (ref 4.22–5.81)
RDW: 18.6 % — ABNORMAL HIGH (ref 11.5–15.5)
WBC Count: 6 10*3/uL (ref 4.0–10.5)
nRBC: 0 % (ref 0.0–0.2)

## 2021-04-29 LAB — LACTATE DEHYDROGENASE: LDH: 299 U/L — ABNORMAL HIGH (ref 98–192)

## 2021-04-29 MED ORDER — PREDNISONE 20 MG PO TABS
60.0000 mg | ORAL_TABLET | Freq: Every day | ORAL | 2 refills | Status: DC
  Filled 2021-04-29 (×2): qty 12, 4d supply, fill #0

## 2021-04-29 MED ORDER — ETOPOSIDE 50 MG PO CAPS
150.0000 mg | ORAL_CAPSULE | Freq: Every day | ORAL | 0 refills | Status: AC
  Filled 2021-04-29 (×3): qty 6, 2d supply, fill #0

## 2021-04-29 MED ORDER — SODIUM CHLORIDE 0.9 % IV SOLN
10.0000 mg | Freq: Once | INTRAVENOUS | Status: AC
  Administered 2021-04-29: 10 mg via INTRAVENOUS
  Filled 2021-04-29: qty 1

## 2021-04-29 MED ORDER — DIPHENHYDRAMINE HCL 25 MG PO CAPS
50.0000 mg | ORAL_CAPSULE | Freq: Once | ORAL | Status: AC
  Administered 2021-04-29: 50 mg via ORAL
  Filled 2021-04-29: qty 2

## 2021-04-29 MED ORDER — TRAMADOL HCL 50 MG PO TABS: ORAL_TABLET | ORAL | 1 refills | Status: DC

## 2021-04-29 MED ORDER — SODIUM CHLORIDE 0.9 % IV SOLN
375.0000 mg/m2 | Freq: Once | INTRAVENOUS | Status: AC
  Administered 2021-04-29: 700 mg via INTRAVENOUS
  Filled 2021-04-29: qty 20

## 2021-04-29 MED ORDER — SODIUM CHLORIDE 0.9 % IV SOLN
50.0000 mg/m2 | Freq: Once | INTRAVENOUS | Status: AC
  Administered 2021-04-29: 100 mg via INTRAVENOUS
  Filled 2021-04-29: qty 5

## 2021-04-29 MED ORDER — SODIUM CHLORIDE 0.9 % IV SOLN: Freq: Once | INTRAVENOUS | Status: AC

## 2021-04-29 MED ORDER — PALONOSETRON HCL INJECTION 0.25 MG/5ML
0.2500 mg | Freq: Once | INTRAVENOUS | Status: AC
  Administered 2021-04-29: 0.25 mg via INTRAVENOUS
  Filled 2021-04-29: qty 5

## 2021-04-29 MED ORDER — ACETAMINOPHEN 325 MG PO TABS
650.0000 mg | ORAL_TABLET | Freq: Once | ORAL | Status: AC
  Administered 2021-04-29: 650 mg via ORAL
  Filled 2021-04-29: qty 2

## 2021-04-29 MED ORDER — SODIUM CHLORIDE 0.9 % IV SOLN
600.0000 mg/m2 | Freq: Once | INTRAVENOUS | Status: AC
  Administered 2021-04-29: 1160 mg via INTRAVENOUS
  Filled 2021-04-29: qty 58

## 2021-04-29 MED ORDER — HEPARIN SOD (PORK) LOCK FLUSH 100 UNIT/ML IV SOLN
500.0000 [IU] | Freq: Once | INTRAVENOUS | Status: AC | PRN
  Administered 2021-04-29: 500 [IU]

## 2021-04-29 MED ORDER — SODIUM CHLORIDE 0.9% FLUSH
10.0000 mL | INTRAVENOUS | Status: DC | PRN
  Administered 2021-04-29: 10 mL

## 2021-04-29 NOTE — Progress Notes (Signed)
Patient seen by Dr. Benay Spice today  Vitals are within treatment parameters.  Labs reviewed by Dr. Benay Spice and are not all within treatment parameters. Creatinine 1.35 BUN 27 ok to treat   Per physician team, patient is ready for treatment and there are NO modifications to the treatment plan.

## 2021-04-29 NOTE — Patient Instructions (Signed)
[facility]   Discharge Instructions: Thank you for choosing [facility] to provide your oncology and hematology care.   If you have a lab appointment with the [facility], please go directly to the [facility] and check in at the registration area.   Wear comfortable clothing and clothing appropriate for easy access to any Portacath or PICC line.   We strive to give you quality time with your provider. You may need to reschedule your appointment if you arrive late (15 or more minutes).  Arriving late affects you and other patients whose appointments are after yours.  Also, if you miss three or more appointments without notifying the office, you may be dismissed from the clinic at the providers discretion.      For prescription refill requests, have your pharmacy contact our office and allow 72 hours for refills to be completed.    Today you received the following chemotherapy and/or immunotherapy agents cytoxan, etoposide, rituximab-pvvr      To help prevent nausea and vomiting after your treatment, we encourage you to take your nausea medication as directed.  BELOW ARE SYMPTOMS THAT SHOULD BE REPORTED IMMEDIATELY: *FEVER GREATER THAN 100.4 F (38 C) OR HIGHER *CHILLS OR SWEATING *NAUSEA AND VOMITING THAT IS NOT CONTROLLED WITH YOUR NAUSEA MEDICATION *UNUSUAL SHORTNESS OF BREATH *UNUSUAL BRUISING OR BLEEDING *URINARY PROBLEMS (pain or burning when urinating, or frequent urination) *BOWEL PROBLEMS (unusual diarrhea, constipation, pain near the anus) TENDERNESS IN MOUTH AND THROAT WITH OR WITHOUT PRESENCE OF ULCERS (sore throat, sores in mouth, or a toothache) UNUSUAL RASH, SWELLING OR PAIN  UNUSUAL VAGINAL DISCHARGE OR ITCHING   Items with * indicate a potential emergency and should be followed up as soon as possible or go to the Emergency Department if any problems should occur.  Please show the CHEMOTHERAPY ALERT CARD or IMMUNOTHERAPY  ALERT CARD at check-in to the Emergency Department and triage nurse.  Should you have questions after your visit or need to cancel or reschedule your appointment, please contact [facility]  Dept: [phone]  and follow the prompts.  Office hours are 8:00 a.m. to 4:30 p.m. Monday - Friday. Please note that voicemails left after 4:00 p.m. may not be returned until the following business day.  We are closed weekends and major holidays. You have access to a nurse at all times for urgent questions. Please call the main number to the clinic Dept: [phone] and follow the prompts.   For any non-urgent questions, you may also contact your provider using MyChart. We now offer e-Visits for anyone 39 and older to request care online for non-urgent symptoms. For details visit [email or web address].   Also download the MyChart app! Go to the app store, search "MyChart", open the app, select [facility], and log in with your MyChart username and password.  Due to Covid, a mask is required upon entering the hospital/clinic. If you do not have a mask, one will be given to you upon arrival. For doctor visits, patients may have 1 support person aged 5 or older with them. For treatment visits, patients cannot have anyone with them due to current Covid guidelines and our immunocompromised population.   Cyclophosphamide Injection What is this medication? CYCLOPHOSPHAMIDE (sye kloe FOSS fa mide) is a chemotherapy drug. It slows the growth of cancer cells. This medicine is used to treat many types of cancer like lymphoma, myeloma, leukemia, breast cancer, and ovarian cancer, to name a few.  This medicine may be used for other purposes; ask your health care provider or pharmacist if you have questions. COMMON BRAND NAME(S): Cytoxan, Neosar What should I tell my care team before I take this medication? They need to know if you have any of these conditions: heart disease history of  irregular heartbeat infection kidney disease liver disease low blood counts, like white cells, platelets, or red blood cells on hemodialysis recent or ongoing radiation therapy scarring or thickening of the lungs trouble passing urine an unusual or allergic reaction to cyclophosphamide, other medicines, foods, dyes, or preservatives pregnant or trying to get pregnant breast-feeding How should I use this medication? This drug is usually given as an injection into a vein or muscle or by infusion into a vein. It is administered in a hospital or clinic by a specially trained health care professional. Talk to your pediatrician regarding the use of this medicine in children. Special care may be needed. Overdosage: If you think you have taken too much of this medicine contact a poison control center or emergency room at once. NOTE: This medicine is only for you. Do not share this medicine with others. What if I miss a dose? It is important not to miss your dose. Call your doctor or health care professional if you are unable to keep an appointment. What may interact with this medication? amphotericin B azathioprine certain antivirals for HIV or hepatitis certain medicines for blood pressure, heart disease, irregular heart beat certain medicines that treat or prevent blood clots like warfarin certain other medicines for cancer cyclosporine etanercept indomethacin medicines that relax muscles for surgery medicines to increase blood counts metronidazole This list may not describe all possible interactions. Give your health care provider a list of all the medicines, herbs, non-prescription drugs, or dietary supplements you use. Also tell them if you smoke, drink alcohol, or use illegal drugs. Some items may interact with your medicine. What should I watch for while using this medication? Your condition will be monitored carefully while you are receiving this medicine. You may need blood work  done while you are taking this medicine. Drink water or other fluids as directed. Urinate often, even at night. Some products may contain alcohol. Ask your health care professional if this medicine contains alcohol. Be sure to tell all health care professionals you are taking this medicine. Certain medicines, like metronidazole and disulfiram, can cause an unpleasant reaction when taken with alcohol. The reaction includes flushing, headache, nausea, vomiting, sweating, and increased thirst. The reaction can last from 30 minutes to several hours. Do not become pregnant while taking this medicine or for 1 year after stopping it. Women should inform their health care professional if they wish to become pregnant or think they might be pregnant. Men should not father a child while taking this medicine and for 4 months after stopping it. There is potential for serious side effects to an unborn child. Talk to your health care professional for more information. Do not breast-feed an infant while taking this medicine or for 1 week after stopping it. This medicine has caused ovarian failure in some women. This medicine may make it more difficult to get pregnant. Talk to your health care professional if you are concerned about your fertility. This medicine has caused decreased sperm counts in some men. This may make it more difficult to father a child. Talk to your health care professional if you are concerned about your fertility. Call your health care professional for advice if you  get a fever, chills, or sore throat, or other symptoms of a cold or flu. Do not treat yourself. This medicine decreases your body's ability to fight infections. Try to avoid being around people who are sick. Avoid taking medicines that contain aspirin, acetaminophen, ibuprofen, naproxen, or ketoprofen unless instructed by your health care professional. These medicines may hide a fever. Talk to your health care professional about your risk  of cancer. You may be more at risk for certain types of cancer if you take this medicine. If you are going to need surgery or other procedure, tell your health care professional that you are using this medicine. Be careful brushing or flossing your teeth or using a toothpick because you may get an infection or bleed more easily. If you have any dental work done, tell your dentist you are receiving this medicine. What side effects may I notice from receiving this medication? Side effects that you should report to your doctor or health care professional as soon as possible: allergic reactions like skin rash, itching or hives, swelling of the face, lips, or tongue breathing problems nausea, vomiting signs and symptoms of bleeding such as bloody or black, tarry stools; red or dark brown urine; spitting up blood or brown material that looks like coffee grounds; red spots on the skin; unusual bruising or bleeding from the eyes, gums, or nose signs and symptoms of heart failure like fast, irregular heartbeat, sudden weight gain; swelling of the ankles, feet, hands signs and symptoms of infection like fever; chills; cough; sore throat; pain or trouble passing urine signs and symptoms of kidney injury like trouble passing urine or change in the amount of urine signs and symptoms of liver injury like dark yellow or brown urine; general ill feeling or flu-like symptoms; light-colored stools; loss of appetite; nausea; right upper belly pain; unusually weak or tired; yellowing of the eyes or skin Side effects that usually do not require medical attention (report to your doctor or health care professional if they continue or are bothersome): confusion decreased hearing diarrhea facial flushing hair loss headache loss of appetite missed menstrual periods signs and symptoms of low red blood cells or anemia such as unusually weak or tired; feeling faint or lightheaded; falls skin discoloration This list may  not describe all possible side effects. Call your doctor for medical advice about side effects. You may report side effects to FDA at 1-800-FDA-1088. Where should I keep my medication? This drug is given in a hospital or clinic and will not be stored at home. NOTE: This sheet is a summary. It may not cover all possible information. If you have questions about this medicine, talk to your doctor, pharmacist, or health care provider.  2022 Elsevier/Gold Standard (2020-12-03 00:00:00)  Etoposide, VP-16 injection What is this medication? ETOPOSIDE, VP-16 (e toe POE side) is a chemotherapy drug. It is used to treat testicular cancer, lung cancer, and other cancers. This medicine may be used for other purposes; ask your health care provider or pharmacist if you have questions. COMMON BRAND NAME(S): Etopophos, Toposar, VePesid What should I tell my care team before I take this medication? They need to know if you have any of these conditions: infection kidney disease liver disease low blood counts, like low white cell, platelet, or red cell counts an unusual or allergic reaction to etoposide, other medicines, foods, dyes, or preservatives pregnant or trying to get pregnant breast-feeding How should I use this medication? This medicine is for infusion into a vein.  It is administered in a hospital or clinic by a specially trained health care professional. Talk to your pediatrician regarding the use of this medicine in children. Special care may be needed. Overdosage: If you think you have taken too much of this medicine contact a poison control center or emergency room at once. NOTE: This medicine is only for you. Do not share this medicine with others. What if I miss a dose? It is important not to miss your dose. Call your doctor or health care professional if you are unable to keep an appointment. What may interact with this medication? This medicine may interact with the following  medications: warfarin This list may not describe all possible interactions. Give your health care provider a list of all the medicines, herbs, non-prescription drugs, or dietary supplements you use. Also tell them if you smoke, drink alcohol, or use illegal drugs. Some items may interact with your medicine. What should I watch for while using this medication? Visit your doctor for checks on your progress. This drug may make you feel generally unwell. This is not uncommon, as chemotherapy can affect healthy cells as well as cancer cells. Report any side effects. Continue your course of treatment even though you feel ill unless your doctor tells you to stop. In some cases, you may be given additional medicines to help with side effects. Follow all directions for their use. Call your doctor or health care professional for advice if you get a fever, chills or sore throat, or other symptoms of a cold or flu. Do not treat yourself. This drug decreases your body's ability to fight infections. Try to avoid being around people who are sick. This medicine may increase your risk to bruise or bleed. Call your doctor or health care professional if you notice any unusual bleeding. Talk to your doctor about your risk of cancer. You may be more at risk for certain types of cancers if you take this medicine. Do not become pregnant while taking this medicine or for at least 6 months after stopping it. Women should inform their doctor if they wish to become pregnant or think they might be pregnant. Women of child-bearing potential will need to have a negative pregnancy test before starting this medicine. There is a potential for serious side effects to an unborn child. Talk to your health care professional or pharmacist for more information. Do not breast-feed an infant while taking this medicine. Men must use a latex condom during sexual contact with a woman while taking this medicine and for at least 4 months after  stopping it. A latex condom is needed even if you have had a vasectomy. Contact your doctor right away if your partner becomes pregnant. Do not donate sperm while taking this medicine and for at least 4 months after you stop taking this medicine. Men should inform their doctors if they wish to father a child. This medicine may lower sperm counts. What side effects may I notice from receiving this medication? Side effects that you should report to your doctor or health care professional as soon as possible: allergic reactions like skin rash, itching or hives, swelling of the face, lips, or tongue low blood counts - this medicine may decrease the number of white blood cells, red blood cells, and platelets. You may be at increased risk for infections and bleeding nausea, vomiting redness, blistering, peeling or loosening of the skin, including inside the mouth signs and symptoms of infection like fever; chills; cough; sore throat;  pain or trouble passing urine signs and symptoms of low red blood cells or anemia such as unusually weak or tired; feeling faint or lightheaded; falls; breathing problems unusual bruising or bleeding Side effects that usually do not require medical attention (report to your doctor or health care professional if they continue or are bothersome): changes in taste diarrhea hair loss loss of appetite mouth sores This list may not describe all possible side effects. Call your doctor for medical advice about side effects. You may report side effects to FDA at 1-800-FDA-1088. Where should I keep my medication? This drug is given in a hospital or clinic and will not be stored at home. NOTE: This sheet is a summary. It may not cover all possible information. If you have questions about this medicine, talk to your doctor, pharmacist, or health care provider.  2022 Elsevier/Gold Standard (2020-12-03 00:00:00)  Rituximab Injection What is this medication? RITUXIMAB (ri TUX i mab)  is a monoclonal antibody. It is used to treat certain types of cancer like non-Hodgkin lymphoma and chronic lymphocytic leukemia. It is also used to treat rheumatoid arthritis, granulomatosis with polyangiitis, microscopic polyangiitis, and pemphigus vulgaris. This medicine may be used for other purposes; ask your health care provider or pharmacist if you have questions. COMMON BRAND NAME(S): RIABNI, Rituxan, RUXIENCE What should I tell my care team before I take this medication? They need to know if you have any of these conditions: chest pain heart disease infection especially a viral infection such as chickenpox, cold sores, hepatitis B, or herpes immune system problems irregular heartbeat or rhythm kidney disease low blood counts (white cells, platelets, or red cells) lung disease recent or upcoming vaccine an unusual or allergic reaction to rituximab, other medicines, foods, dyes, or preservatives pregnant or trying to get pregnant breast-feeding How should I use this medication? This medicine is injected into a vein. It is given by a health care provider in a hospital or clinic setting. A special MedGuide will be given to you before each treatment. Be sure to read this information carefully each time. Talk to your health care provider about the use of this medicine in children. While this drug may be prescribed for children as young as 6 months for selected conditions, precautions do apply. Overdosage: If you think you have taken too much of this medicine contact a poison control center or emergency room at once. NOTE: This medicine is only for you. Do not share this medicine with others. What if I miss a dose? Keep appointments for follow-up doses. It is important not to miss your dose. Call your health care provider if you are unable to keep an appointment. What may interact with this medication? Do not take this medicine with any of the following medicines: live vaccines This  medicine may also interact with the following medicines: cisplatin This list may not describe all possible interactions. Give your health care provider a list of all the medicines, herbs, non-prescription drugs, or dietary supplements you use. Also tell them if you smoke, drink alcohol, or use illegal drugs. Some items may interact with your medicine. What should I watch for while using this medication? Your condition will be monitored carefully while you are receiving this medicine. You may need blood work done while you are taking this medicine. This medicine can cause serious infusion reactions. To reduce the risk your health care provider may give you other medicines to take before receiving this one. Be sure to follow the directions from your  health care provider. This medicine may increase your risk of getting an infection. Call your health care provider for advice if you get a fever, chills, sore throat, or other symptoms of a cold or flu. Do not treat yourself. Try to avoid being around people who are sick. Call your health care provider if you are around anyone with measles, chickenpox, or if you develop sores or blisters that do not heal properly. Avoid taking medicines that contain aspirin, acetaminophen, ibuprofen, naproxen, or ketoprofen unless instructed by your health care provider. These medicines may hide a fever. This medicine may cause serious skin reactions. They can happen weeks to months after starting the medicine. Contact your health care provider right away if you notice fevers or flu-like symptoms with a rash. The rash may be red or purple and then turn into blisters or peeling of the skin. Or, you might notice a red rash with swelling of the face, lips or lymph nodes in your neck or under your arms. In some patients, this medicine may cause a serious brain infection that may cause death. If you have any problems seeing, thinking, speaking, walking, or standing, tell your  healthcare professional right away. If you cannot reach your healthcare professional, urgently seek other source of medical care. Do not become pregnant while taking this medicine or for at least 12 months after stopping it. Women should inform their health care provider if they wish to become pregnant or think they might be pregnant. There is potential for serious harm to an unborn child. Talk to your health care provider for more information. Women should use a reliable form of birth control while taking this medicine and for 12 months after stopping it. Do not breast-feed while taking this medicine or for at least 6 months after stopping it. What side effects may I notice from receiving this medication? Side effects that you should report to your health care provider as soon as possible: allergic reactions (skin rash, itching or hives; swelling of the face, lips, or tongue) diarrhea edema (sudden weight gain; swelling of the ankles, feet, hands or other unusual swelling; trouble breathing) fast, irregular heartbeat heart attack (trouble breathing; pain or tightness in the chest, neck, back or arms; unusually weak or tired) infection (fever, chills, cough, sore throat, pain or trouble passing urine) kidney injury (trouble passing urine or change in the amount of urine) liver injury (dark yellow or brown urine; general ill feeling or flu-like symptoms; loss of appetite, right upper belly pain; unusually weak or tired, yellowing of the eyes or skin) low blood pressure (dizziness; feeling faint or lightheaded, falls; unusually weak or tired) low red blood cell counts (trouble breathing; feeling faint; lightheaded, falls; unusually weak or tired) mouth sores redness, blistering, peeling, or loosening of the skin, including inside the mouth stomach pain unusual bruising or bleeding wheezing (trouble breathing with loud or whistling sounds) vomiting Side effects that usually do not require medical  attention (report to your health care provider if they continue or are bothersome): headache joint pain muscle cramps, pain nausea This list may not describe all possible side effects. Call your doctor for medical advice about side effects. You may report side effects to FDA at 1-800-FDA-1088. Where should I keep my medication? This medicine is given in a hospital or clinic. It will not be stored at home. NOTE: This sheet is a summary. It may not cover all possible information. If you have questions about this medicine, talk to your doctor, pharmacist,  or health care provider.  2022 Elsevier/Gold Standard (2020-03-18 00:00:00)

## 2021-04-29 NOTE — Progress Notes (Signed)
Patient presents for treatment. RN assessment completed along with the following:  Labs/vitals reviewed - Yes, and ok to treat with Creat 1.35  Bun 28 per note from Danae Orleans, LPN    Weight within 10% of previous measurement - Yes Informed consent completed and reflects current therapy/intent - Yes, on date 01/14/21             Provider progress note reviewed - Yes, today's provider note was reviewed. Treatment/Antibody/Supportive plan reviewed - Yes, and there are no adjustments needed for today's treatment. S&H and other orders reviewed - Yes, and there are no additional orders identified. Previous treatment date reviewed - Yes, and the appropriate amount of time has elapsed between treatments. Clinic Hand Off Received from - Danae Orleans, LPN   Patient to proceed with treatment.

## 2021-04-29 NOTE — Progress Notes (Signed)
°  [name] [name]   Diagnosis: Non-Hodgkin's lymphoma  INTERVAL HISTORY:   [name] [name] completed another cycle of chemotherapy beginning 04/08/2021.  No nausea/vomiting, mouth sores, or rash.  He reports increased malaise following the cycle of chemotherapy.  He had CHF symptoms that improved with furosemide.  No fever, cough, or chest pain.  Objective:  Vital signs in last 24 hours:  Blood pressure 122/80, pulse 83, temperature 98 F (36.7 C), temperature source Oral, resp. rate 18, height 5\' 8"  (1.727 m), weight 172 lb (78 kg), SpO2 95 %.    HEENT: No thrush or ulcer Resp: Inspiratory rales at the left greater than right posterior base, no respiratory distress Cardio: Regular rate and rhythm GI: No hepatosplenomegaly, nontender Vascular: Trace pitting edema at the right greater than left lower leg and ankle    Portacath/PICC-without erythema  Lab Results:  Lab Results  Component Value Date   WBC 6.0 04/29/2021   HGB 10.3 (L) 04/29/2021   HCT 33.2 (L) 04/29/2021   MCV 80.0 04/29/2021   PLT 178 04/29/2021   NEUTROABS 3.6 04/29/2021    CMP  Lab Results  Component Value Date   NA 138 04/08/2021   K 4.4 04/08/2021   CL 104 04/08/2021   CO2 26 04/08/2021   GLUCOSE 128 (H) 04/08/2021   BUN 23 04/08/2021   CREATININE 1.29 (H) 04/08/2021   CALCIUM 8.7 (L) 04/08/2021   PROT 6.4 (L) 04/08/2021   ALBUMIN 4.0 04/08/2021   AST 15 04/08/2021   ALT 8 04/08/2021   ALKPHOS 61 04/08/2021   BILITOT 0.5 04/08/2021   GFRNONAA 55 (L) 04/08/2021   GFRAA 59 (L) 03/26/2020     Medications: I have reviewed the patient's current medications.   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 CT abdomen/pelvis 11/18/2020-2.4 x 2.3 segment 7 lesion, multiple additional subcentimeter lesions, some but not all present in 2016 CT chest 11/23/2020-11 mm right retrocrural node, asbestosis related pleural disease CT-guided  biopsy of the left periaortic lymph node 12/12/2020-large B-cell lymphoma, CD5 positive, CD20 positive, FISH panel-BCL6 gene rearrangement positive, MYC negative PET 01/01/2021-hypermetabolic left supraclavicular and left upper mediastinal nodes, bulky hypermetabolic periaortic retroperitoneal nodes, single hypermetabolic metastasis in the right liver, multiple hypermetabolic spleen lesions Cycle 1 CEOP-Rituxan 01/14/2021 Cycle 2 CEOP-Rituxan 02/04/2021 Cycle 3 CEOP-Rituxan 02/25/2021 Cycle 4 CEOP-Rituxan 03/18/2021 PET 04/02/2021-resolution of lymphadenopathy in the left neck, supraclavicular region, left mediastinum.  Resolution of hypermetabolic abdominal lymphadenopathy and hypermetabolic disease posterior to the liver and spleen, no new or progressive hypermetabolism, stable 4 cm abdominal aortic aneurysm Cycle 5 CEOP-Rituxan 04/08/2021 Cycle 6 CEOP-Rituxan 04/29/2021 Iron deficiency anemia Coronary artery disease Peripheral vascular disease CHF Hypertension Carotid artery disease CVA Peripheral neuropathy      Disposition: [name] [name] has completed 5 cycles of systemic therapy for treatment of advanced stage non-Hodgkin's involvement.  He has tolerated the chemotherapy well.  He will complete a final planned cycle of chemotherapy today.  He will return for an office and lab visit in 1 month.  The increased dyspnea over the past several weeks may be related to CHF.  He reports improvement with furosemide.  He will continue furosemide as needed.  Betsy Coder, MD  04/29/2021  8:45 AM

## 2021-05-02 ENCOUNTER — Telehealth: Payer: Self-pay | Admitting: Family Medicine

## 2021-05-02 DIAGNOSIS — E782 Mixed hyperlipidemia: Secondary | ICD-10-CM

## 2021-05-02 NOTE — Telephone Encounter (Signed)
Blood work ordered in Epic. Wife (DPR) notified. 

## 2021-05-02 NOTE — Telephone Encounter (Signed)
Pt wife called and asked if Nassir needed bllod work done to please place the order. They have an appt with Dr Nicki Reaper 05/12/21. She stated he is having blood work done at the cancer center but she isnt sure of he needs different blood work for this visit. She would like a call to know if he does.   [phone]

## 2021-05-02 NOTE — Telephone Encounter (Signed)
Patient has orders for oncology for : Lactate, BMP, CBC

## 2021-05-02 NOTE — Telephone Encounter (Signed)
He is due for lipid profile.  This can be ordered.  Family can follow-up with cancer center to see if they are willing to do the lipid profile as part of the blood draw.  Otherwise the labs that were ordered by hematology oncology are sufficient for [place]

## 2021-05-05 DIAGNOSIS — E782 Mixed hyperlipidemia: Secondary | ICD-10-CM | POA: Diagnosis not present

## 2021-05-06 LAB — LIPID PANEL
Chol/HDL Ratio: 2.3 ratio (ref 0.0–5.0)
Cholesterol, Total: 91 mg/dL — ABNORMAL LOW (ref 100–199)
HDL: 40 mg/dL (ref >39)
LDL Chol Calc (NIH): 35 mg/dL (ref 0–99)
Triglycerides: 77 mg/dL (ref 0–149)
VLDL Cholesterol Cal: 16 mg/dL (ref 5–40)

## 2021-05-10 ENCOUNTER — Other Ambulatory Visit: Payer: Self-pay | Admitting: Family Medicine

## 2021-05-12 ENCOUNTER — Other Ambulatory Visit: Payer: Self-pay

## 2021-05-12 ENCOUNTER — Ambulatory Visit (INDEPENDENT_AMBULATORY_CARE_PROVIDER_SITE_OTHER): Payer: PPO | Admitting: Family Medicine

## 2021-05-12 VITALS — BP 121/85 | HR 73 | Temp 98.1°F | Ht 68.0 in | Wt 175.0 lb

## 2021-05-12 DIAGNOSIS — I10 Essential (primary) hypertension: Secondary | ICD-10-CM | POA: Diagnosis not present

## 2021-05-12 MED ORDER — AMLODIPINE BESYLATE 10 MG PO TABS: 10.0000 mg | ORAL_TABLET | Freq: Every day | ORAL | 1 refills | Status: DC

## 2021-05-12 MED ORDER — TAMSULOSIN HCL 0.4 MG PO CAPS: ORAL_CAPSULE | ORAL | 1 refills | Status: DC

## 2021-05-12 MED ORDER — PANTOPRAZOLE SODIUM 20 MG PO TBEC: 20.0000 mg | DELAYED_RELEASE_TABLET | Freq: Two times a day (BID) | ORAL | 1 refills | Status: DC

## 2021-05-12 MED ORDER — CLOPIDOGREL BISULFATE 75 MG PO TABS
75.0000 mg | ORAL_TABLET | Freq: Every day | ORAL | 1 refills | Status: DC
Start: 2021-05-12 — End: 2021-12-26

## 2021-05-12 NOTE — Progress Notes (Signed)
° °  Subjective:    Patient ID: [name] [name], male    DOB: 12-Dec-1934, 87 y.o.   MRN: [id number]  HPI Patient here for 4 month follow up. Wife states patient is cancer free.  Patient is short of breath.  Patient presents here with follow-up of his chronic health issues including cardiac as well as hypertension and also follow-up from his chemotherapy Overall doing very well but he does get out of breath with activity but he states he has been fatigued ever since going through 6 rounds of chemo he denies any chest pressure or pain denies PND denies orthopnea  Review of Systems     Objective:   Physical Exam General-in no acute distress Eyes-no discharge Lungs-respiratory rate normal, CTA CV-no murmurs,RRR Extremities skin warm dry no edema Neuro grossly normal Behavior normal, alert        Assessment & Plan:  1. Primary hypertension Blood pressure decent control continue current medications  Has gone through treatments for his cancer doing very well and they will monitor closely  Patient will follow-up here in 4 months I do not feel he needs to have echo or other intervention currently but if he does get worse we will get him back into with cardiology

## 2021-05-27 ENCOUNTER — Ambulatory Visit (HOSPITAL_BASED_OUTPATIENT_CLINIC_OR_DEPARTMENT_OTHER)
Admission: RE | Admit: 2021-05-27 | Discharge: 2021-05-27 | Disposition: A | Discharge status: Home / Self Care | Payer: PPO | Source: Ambulatory Visit | Attending: Nurse Practitioner | Admitting: Nurse Practitioner

## 2021-05-27 ENCOUNTER — Inpatient Hospital Stay: Payer: PPO | Attending: Oncology

## 2021-05-27 ENCOUNTER — Inpatient Hospital Stay: Payer: PPO | Admitting: Nurse Practitioner

## 2021-05-27 ENCOUNTER — Other Ambulatory Visit: Payer: Self-pay

## 2021-05-27 ENCOUNTER — Encounter: Payer: Self-pay | Admitting: Nurse Practitioner

## 2021-05-27 ENCOUNTER — Inpatient Hospital Stay: Payer: PPO

## 2021-05-27 VITALS — BP 110/84 | HR 74 | Temp 97.8°F | Resp 20 | Ht 68.0 in | Wt 172.8 lb

## 2021-05-27 DIAGNOSIS — C8518 Unspecified B-cell lymphoma, lymph nodes of multiple sites: Secondary | ICD-10-CM | POA: Insufficient documentation

## 2021-05-27 DIAGNOSIS — R0602 Shortness of breath: Secondary | ICD-10-CM | POA: Diagnosis not present

## 2021-05-27 DIAGNOSIS — I517 Cardiomegaly: Secondary | ICD-10-CM | POA: Diagnosis not present

## 2021-05-27 DIAGNOSIS — C8510 Unspecified B-cell lymphoma, unspecified site: Secondary | ICD-10-CM | POA: Insufficient documentation

## 2021-05-27 DIAGNOSIS — R7402 Elevation of levels of lactic acid dehydrogenase (LDH): Secondary | ICD-10-CM | POA: Insufficient documentation

## 2021-05-27 DIAGNOSIS — C8580 Other specified types of non-Hodgkin lymphoma, unspecified site: Secondary | ICD-10-CM

## 2021-05-27 DIAGNOSIS — R079 Chest pain, unspecified: Secondary | ICD-10-CM | POA: Diagnosis not present

## 2021-05-27 DIAGNOSIS — R0609 Other forms of dyspnea: Secondary | ICD-10-CM | POA: Diagnosis not present

## 2021-05-27 DIAGNOSIS — D509 Iron deficiency anemia, unspecified: Secondary | ICD-10-CM | POA: Diagnosis not present

## 2021-05-27 LAB — CBC WITH DIFFERENTIAL (CANCER CENTER ONLY)
Abs Immature Granulocytes: 0.04 10*3/uL (ref 0.00–0.07)
Basophils Absolute: 0.1 10*3/uL (ref 0.0–0.1)
Basophils Relative: 1 %
Eosinophils Absolute: 0.4 10*3/uL (ref 0.0–0.5)
Eosinophils Relative: 5 %
HCT: 34.5 % — ABNORMAL LOW (ref 39.0–52.0)
Hemoglobin: 10.3 g/dL — ABNORMAL LOW (ref 13.0–17.0)
Immature Granulocytes: 1 %
Lymphocytes Relative: 5 %
Lymphs Abs: 0.4 10*3/uL — ABNORMAL LOW (ref 0.7–4.0)
MCH: 24.3 pg — ABNORMAL LOW (ref 26.0–34.0)
MCHC: 29.9 g/dL — ABNORMAL LOW (ref 30.0–36.0)
MCV: 81.4 fL (ref 80.0–100.0)
Monocytes Absolute: 0.8 10*3/uL (ref 0.1–1.0)
Monocytes Relative: 11 %
Neutro Abs: 5.9 10*3/uL (ref 1.7–7.7)
Neutrophils Relative %: 77 %
Platelet Count: 184 10*3/uL (ref 150–400)
RBC: 4.24 MIL/uL (ref 4.22–5.81)
RDW: 19.9 % — ABNORMAL HIGH (ref 11.5–15.5)
WBC Count: 7.6 10*3/uL (ref 4.0–10.5)
nRBC: 0 % (ref 0.0–0.2)

## 2021-05-27 LAB — CMP (CANCER CENTER ONLY)
ALT: 7 U/L (ref 0–44)
AST: 13 U/L — ABNORMAL LOW (ref 15–41)
Albumin: 4 g/dL (ref 3.5–5.0)
Alkaline Phosphatase: 56 U/L (ref 38–126)
Anion gap: 8 (ref 5–15)
BUN: 25 mg/dL — ABNORMAL HIGH (ref 8–23)
CO2: 24 mmol/L (ref 22–32)
Calcium: 8.7 mg/dL — ABNORMAL LOW (ref 8.9–10.3)
Chloride: 105 mmol/L (ref 98–111)
Creatinine: 1.22 mg/dL (ref 0.61–1.24)
GFR, Estimated: 58 mL/min — ABNORMAL LOW (ref >60)
Glucose, Bld: 114 mg/dL — ABNORMAL HIGH (ref 70–99)
Potassium: 4.4 mmol/L (ref 3.5–5.1)
Sodium: 137 mmol/L (ref 135–145)
Total Bilirubin: 0.5 mg/dL (ref 0.3–1.2)
Total Protein: 6 g/dL — ABNORMAL LOW (ref 6.5–8.1)

## 2021-05-27 LAB — LACTATE DEHYDROGENASE: LDH: 263 U/L — ABNORMAL HIGH (ref 98–192)

## 2021-05-27 NOTE — Progress Notes (Signed)
°  [place] OFFICE PROGRESS NOTE   Diagnosis: Non-Hodgkin's lymphoma  INTERVAL HISTORY:   [name] [name] returns as scheduled.  He completed cycle 6 CEOP/Rituxan 04/29/2021.  He has noticed dyspnea on exertion since the last 2 chemotherapy treatments.  This has worsened over the past few weeks.  He has an occasional cough.  No leg swelling.  No fever.  He denies chest pain.  He is taking Lasix 20 mg daily.  He denies orthopnea.  No weight gain.   Objective:  Vital signs in last 24 hours:  Blood pressure 110/84, pulse 74, temperature 97.8 F (36.6 C), temperature source Oral, resp. rate 20, height 5\' 8"  (1.727 m), weight 172 lb 12.8 oz (78.4 kg), SpO2 96 %.    HEENT: No thrush or ulcers. Lymphatics: No palpable cervical, supraclavicular, axillary or inguinal lymph nodes. Resp: Lungs clear bilaterally. Cardio: Regular rate and rhythm.  No JVD. GI: Abdomen soft and nontender.  No hepatosplenomegaly. Vascular: Trace bilateral pretibial and ankle edema right greater than left. Port-A-Cath without erythema.  Lab Results:  Lab Results  Component Value Date   WBC 7.6 05/27/2021   HGB 10.3 (L) 05/27/2021   HCT 34.5 (L) 05/27/2021   MCV 81.4 05/27/2021   PLT 184 05/27/2021   NEUTROABS 5.9 05/27/2021    Imaging:  No results found.  Medications: I have reviewed the patient's current medications.  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 CT abdomen/pelvis 11/18/2020-2.4 x 2.3 segment 7 lesion, multiple additional subcentimeter lesions, some but not all present in 2016 CT chest 11/23/2020-11 mm right retrocrural node, asbestosis related pleural disease CT-guided biopsy of the left periaortic lymph node 12/12/2020-large B-cell lymphoma, CD5 positive, CD20 positive, FISH panel-BCL6 gene rearrangement positive, MYC negative PET 01/01/2021-hypermetabolic left supraclavicular and left upper mediastinal nodes, bulky hypermetabolic periaortic  retroperitoneal nodes, single hypermetabolic metastasis in the right liver, multiple hypermetabolic spleen lesions Cycle 1 CEOP-Rituxan 01/14/2021 Cycle 2 CEOP-Rituxan 02/04/2021 Cycle 3 CEOP-Rituxan 02/25/2021 Cycle 4 CEOP-Rituxan 03/18/2021 PET 04/02/2021-resolution of lymphadenopathy in the left neck, supraclavicular region, left mediastinum.  Resolution of hypermetabolic abdominal lymphadenopathy and hypermetabolic disease posterior to the liver and spleen, no new or progressive hypermetabolism, stable 4 cm abdominal aortic aneurysm Cycle 5 CEOP-Rituxan 04/08/2021 Cycle 6 CEOP-Rituxan 04/29/2021 Iron deficiency anemia Coronary artery disease Peripheral vascular disease CHF Hypertension Carotid artery disease CVA Peripheral neuropathy  Disposition: [name] [name] appears stable.  He completed the final cycle of CEOP/Rituxan 1 month ago.  He is in clinical remission from non-Hodgkin's lymphoma.  We reviewed the CBC, chemistry panel and LDH from today.  He has stable mild anemia.  LDH remains mildly elevated.  He has had increased dyspnea on exertion for the past few months.  This may be related to CHF.  We are obtaining a chest x-ray today and recommend he follow-up with [name].  I spoke to his daughter in the lobby after the visit.  She feels the dyspnea on exertion is very similar to a previous CHF exacerbation.  She plans to make an appointment with [name].  He will return for lab, port flush and follow-up in 4 weeks.  We are available to see him sooner if needed.  Plan reviewed with Dr. Benay Spice.   Ned Card ANP/GNP-BC   05/27/2021  9:26 AM

## 2021-05-29 ENCOUNTER — Telehealth: Payer: Self-pay

## 2021-05-29 NOTE — Telephone Encounter (Signed)
Called and spoke with the patient's daughter to let her know the chest x-ray is consistent with CHF, and recommend he follow-up with cardiology.  Patient's daughter request a referral to a cardiology. Patient voiced understanding of instructional and had no further questions or concerns at this time. ?

## 2021-05-29 NOTE — Telephone Encounter (Signed)
-----   Message from [name] Shark, NP sent at 05/29/2021  8:27 AM EST ----- ?Please let his daughter know the chest x-ray is consistent with CHF.  Recommend he follow-up with cardiology. ? ?

## 2021-06-06 ENCOUNTER — Telehealth: Payer: Self-pay | Admitting: Family Medicine

## 2021-06-06 NOTE — Telephone Encounter (Signed)
Patients wife called asking for refills  ?CB# [phone] ?traMADol (ULTRAM) 50 MG tablet [[id number]]  ?  Order Details ?Dose, Route, Frequency: As Directed  ?Dispense Quantity: 90 tablet Refills: 1   ?     ?Sig: TAKE 1 TABLET BY MOUTH THREE TIMES DAILY AS NEEDED FOR MODERATE PAIN  ?furosemide (LASIX) 20 MG tablet [[id number]]  ?  Order Details ?Dose: 20 mg Route: Oral Frequency: Daily PRN for weight gain of 3Lbs in 1 day or 5lbs in 2 days.  ?Dispense Quantity: 90 tablet Refills: 1   ?     ?Sig: Take 1 tablet (20 mg total) by mouth daily as needed (weight gain of 3Lbs in 1 day or 5lbs in 2 days.).  ? ?

## 2021-06-08 ENCOUNTER — Other Ambulatory Visit: Payer: Self-pay | Admitting: Family Medicine

## 2021-06-08 MED ORDER — TRAMADOL HCL 50 MG PO TABS: ORAL_TABLET | ORAL | 4 refills | Status: DC

## 2021-06-10 ENCOUNTER — Telehealth: Payer: Self-pay | Admitting: Family Medicine

## 2021-06-10 ENCOUNTER — Other Ambulatory Visit: Payer: Self-pay

## 2021-06-10 ENCOUNTER — Other Ambulatory Visit: Payer: Self-pay | Admitting: Cardiovascular Disease

## 2021-06-10 ENCOUNTER — Other Ambulatory Visit: Payer: Self-pay | Admitting: Family Medicine

## 2021-06-10 MED ORDER — ATORVASTATIN CALCIUM 20 MG PO TABS: ORAL_TABLET | ORAL | 3 refills | Status: DC

## 2021-06-10 MED ORDER — FUROSEMIDE 20 MG PO TABS: 20.0000 mg | ORAL_TABLET | Freq: Every day | ORAL | 1 refills | Status: DC | PRN

## 2021-06-10 MED ORDER — CARVEDILOL 6.25 MG PO TABS: 6.2500 mg | ORAL_TABLET | Freq: Two times a day (BID) | ORAL | 4 refills | Status: DC

## 2021-06-10 NOTE — Telephone Encounter (Signed)
Patient has been informed per drs refills and recommendations.  ?

## 2021-06-10 NOTE — Telephone Encounter (Signed)
Wife has some questions about patient medication furosemide 20 mg.and carvedilol 6.25 which our provider doesn't prescribe and patient is out of medication ,tried to explain that  it didn't come from our office different provider for both medication. Call you please contact patient.She wants [facility] going [facility]. ?

## 2021-06-10 NOTE — Telephone Encounter (Signed)
Refills were sent please keep all follow-up visits ?

## 2021-06-11 ENCOUNTER — Other Ambulatory Visit: Payer: Self-pay | Admitting: Family Medicine

## 2021-06-24 ENCOUNTER — Other Ambulatory Visit: Payer: Self-pay

## 2021-06-24 ENCOUNTER — Inpatient Hospital Stay: Payer: PPO

## 2021-06-24 ENCOUNTER — Encounter: Payer: Self-pay | Admitting: *Deleted

## 2021-06-24 ENCOUNTER — Inpatient Hospital Stay: Payer: PPO | Attending: Oncology

## 2021-06-24 ENCOUNTER — Inpatient Hospital Stay: Payer: PPO | Admitting: Oncology

## 2021-06-24 VITALS — BP 120/85 | HR 73 | Temp 97.8°F | Resp 18 | Ht 68.0 in | Wt 168.2 lb

## 2021-06-24 DIAGNOSIS — I2510 Atherosclerotic heart disease of native coronary artery without angina pectoris: Secondary | ICD-10-CM | POA: Diagnosis not present

## 2021-06-24 DIAGNOSIS — Z95828 Presence of other vascular implants and grafts: Secondary | ICD-10-CM

## 2021-06-24 DIAGNOSIS — D649 Anemia, unspecified: Secondary | ICD-10-CM | POA: Insufficient documentation

## 2021-06-24 DIAGNOSIS — I110 Hypertensive heart disease with heart failure: Secondary | ICD-10-CM | POA: Diagnosis not present

## 2021-06-24 DIAGNOSIS — C8510 Unspecified B-cell lymphoma, unspecified site: Secondary | ICD-10-CM

## 2021-06-24 DIAGNOSIS — I739 Peripheral vascular disease, unspecified: Secondary | ICD-10-CM | POA: Diagnosis not present

## 2021-06-24 DIAGNOSIS — C8518 Unspecified B-cell lymphoma, lymph nodes of multiple sites: Secondary | ICD-10-CM | POA: Diagnosis not present

## 2021-06-24 DIAGNOSIS — I509 Heart failure, unspecified: Secondary | ICD-10-CM | POA: Diagnosis not present

## 2021-06-24 DIAGNOSIS — G629 Polyneuropathy, unspecified: Secondary | ICD-10-CM | POA: Diagnosis not present

## 2021-06-24 DIAGNOSIS — D509 Iron deficiency anemia, unspecified: Secondary | ICD-10-CM | POA: Diagnosis not present

## 2021-06-24 DIAGNOSIS — D696 Thrombocytopenia, unspecified: Secondary | ICD-10-CM | POA: Diagnosis not present

## 2021-06-24 LAB — CBC WITH DIFFERENTIAL (CANCER CENTER ONLY)
Abs Immature Granulocytes: 0.02 10*3/uL (ref 0.00–0.07)
Basophils Absolute: 0 10*3/uL (ref 0.0–0.1)
Basophils Relative: 1 %
Eosinophils Absolute: 0.3 10*3/uL (ref 0.0–0.5)
Eosinophils Relative: 6 %
HCT: 35.2 % — ABNORMAL LOW (ref 39.0–52.0)
Hemoglobin: 10.6 g/dL — ABNORMAL LOW (ref 13.0–17.0)
Immature Granulocytes: 1 %
Lymphocytes Relative: 10 %
Lymphs Abs: 0.4 10*3/uL — ABNORMAL LOW (ref 0.7–4.0)
MCH: 23.9 pg — ABNORMAL LOW (ref 26.0–34.0)
MCHC: 30.1 g/dL (ref 30.0–36.0)
MCV: 79.5 fL — ABNORMAL LOW (ref 80.0–100.0)
Monocytes Absolute: 0.8 10*3/uL (ref 0.1–1.0)
Monocytes Relative: 18 %
Neutro Abs: 2.7 10*3/uL (ref 1.7–7.7)
Neutrophils Relative %: 64 %
Platelet Count: 113 10*3/uL — ABNORMAL LOW (ref 150–400)
RBC: 4.43 MIL/uL (ref 4.22–5.81)
RDW: 17.9 % — ABNORMAL HIGH (ref 11.5–15.5)
WBC Count: 4.2 10*3/uL (ref 4.0–10.5)
nRBC: 0 % (ref 0.0–0.2)

## 2021-06-24 LAB — LACTATE DEHYDROGENASE: LDH: 255 U/L — ABNORMAL HIGH (ref 98–192)

## 2021-06-24 LAB — CMP (CANCER CENTER ONLY)
ALT: 8 U/L (ref 0–44)
AST: 15 U/L (ref 15–41)
Albumin: 4.2 g/dL (ref 3.5–5.0)
Alkaline Phosphatase: 56 U/L (ref 38–126)
Anion gap: 11 (ref 5–15)
BUN: 24 mg/dL — ABNORMAL HIGH (ref 8–23)
CO2: 21 mmol/L — ABNORMAL LOW (ref 22–32)
Calcium: 8.7 mg/dL — ABNORMAL LOW (ref 8.9–10.3)
Chloride: 106 mmol/L (ref 98–111)
Creatinine: 1.27 mg/dL — ABNORMAL HIGH (ref 0.61–1.24)
GFR, Estimated: 56 mL/min — ABNORMAL LOW (ref >60)
Glucose, Bld: 82 mg/dL (ref 70–99)
Potassium: 4 mmol/L (ref 3.5–5.1)
Sodium: 138 mmol/L (ref 135–145)
Total Bilirubin: 0.6 mg/dL (ref 0.3–1.2)
Total Protein: 6.5 g/dL (ref 6.5–8.1)

## 2021-06-24 MED ORDER — SODIUM CHLORIDE 0.9% FLUSH
10.0000 mL | Freq: Once | INTRAVENOUS | Status: AC
  Administered 2021-06-24: 10 mL via INTRAVENOUS

## 2021-06-24 MED ORDER — HEPARIN SOD (PORK) LOCK FLUSH 100 UNIT/ML IV SOLN
500.0000 [IU] | Freq: Once | INTRAVENOUS | Status: AC
  Administered 2021-06-24: 500 [IU] via INTRAVENOUS

## 2021-06-24 NOTE — Progress Notes (Signed)
?  [name] [name] ?OFFICE PROGRESS NOTE ? ? ?Diagnosis: Non-Hodgkin's lymphoma ? ?INTERVAL HISTORY:  ? ?[name] returns as scheduled.  He is here with his daughter.  Good appetite.  No fever or night sweats.  No pain aside from arthralgias.  He has exertional dyspnea.  He has not seen cardiology since the last office visit here.  He would like to have the Port-A-Cath removed. ? ?Objective: ? ?Vital signs in last 24 hours: ? ?Blood pressure 120/85, pulse 73, temperature 97.8 ?F (36.6 ?C), temperature source Oral, resp. rate 18, height '5\' 8"'$  (1.727 m), weight 168 lb 3.2 oz (76.3 kg), SpO2 95 %. ?  ? ?Lymphatics: No cervical, supraclavicular, axillary, or inguinal nodes ?Resp: Lungs clear bilaterally ?Cardio: Regular rate and rhythm ?GI: No hepatosplenomegaly ?Vascular: No leg edema ?  ? ?Portacath/PICC-without erythema ? ?Lab Results: ? ?Lab Results  ?Component Value Date  ? WBC 4.2 06/24/2021  ? HGB 10.6 (L) 06/24/2021  ? HCT 35.2 (L) 06/24/2021  ? MCV 79.5 (L) 06/24/2021  ? PLT 113 (L) 06/24/2021  ? NEUTROABS 2.7 06/24/2021  ? ? ?CMP  ?Lab Results  ?Component Value Date  ? NA 138 06/24/2021  ? K 4.0 06/24/2021  ? CL 106 06/24/2021  ? CO2 21 (L) 06/24/2021  ? GLUCOSE 82 06/24/2021  ? BUN 24 (H) 06/24/2021  ? CREATININE 1.27 (H) 06/24/2021  ? CALCIUM 8.7 (L) 06/24/2021  ? PROT 6.5 06/24/2021  ? ALBUMIN 4.2 06/24/2021  ? AST 15 06/24/2021  ? ALT 8 06/24/2021  ? ALKPHOS 56 06/24/2021  ? BILITOT 0.6 06/24/2021  ? GFRNONAA 56 (L) 06/24/2021  ? GFRAA 59 (L) 03/26/2020  ? ? ?Lab Results  ?Component Value Date  ? CEA 0.9 11/20/2020  ? ? ? ?Medications: I have reviewed the patient's current medications. ? ? 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 ?CT abdomen/pelvis 11/18/2020-2.4 x 2.3 segment 7 lesion, multiple additional subcentimeter lesions, some but not all present in 2016 ?CT chest 11/23/2020-11 mm right retrocrural node, asbestosis related pleural disease ?CT-guided  biopsy of the left periaortic lymph node 12/12/2020-large B-cell lymphoma, CD5 positive, CD20 positive, FISH panel-BCL6 gene rearrangement positive, MYC negative ?PET 01/01/2021-hypermetabolic left supraclavicular and left upper mediastinal nodes, bulky hypermetabolic periaortic retroperitoneal nodes, single hypermetabolic metastasis in the right liver, multiple hypermetabolic spleen lesions ?Cycle 1 CEOP-Rituxan 01/14/2021 ?Cycle 2 CEOP-Rituxan 02/04/2021 ?Cycle 3 CEOP-Rituxan 02/25/2021 ?Cycle 4 CEOP-Rituxan 03/18/2021 ?PET 04/02/2021-resolution of lymphadenopathy in the left neck, supraclavicular region, left mediastinum.  Resolution of hypermetabolic abdominal lymphadenopathy and hypermetabolic disease posterior to the liver and spleen, no new or progressive hypermetabolism, stable 4 cm abdominal aortic aneurysm ?Cycle 5 CEOP-Rituxan 04/08/2021 ?Cycle 6 CEOP-Rituxan 04/29/2021 ?Iron deficiency anemia ?Coronary artery disease ?Peripheral vascular disease ?CHF ?Hypertension ?Carotid artery disease ?CVA ?Peripheral neuropathy ? ? ? ?Disposition: ?[name] is in clinical remission from [place].  He has persistent mild anemia/thrombocytopenia, likely a residual from the course of chemotherapy.  He will follow-up with Dr. Alvester Chou for evaluation and management of dyspnea.  A chest x-ray last month revealed evidence of CHF. ?He would like to begin physical therapy.  We will make a referral.  He request Port-A-Cath removal.  I made a referral to interventional radiology. ? ?[name] will return for an office visit in 3 months. ? ?Betsy Coder, MD ? ?06/24/2021  ?10:31 AM ? ? ?

## 2021-06-24 NOTE — Progress Notes (Signed)
Left VM for scheduler at [facility] requesting port removal early April.  ?

## 2021-06-25 ENCOUNTER — Telehealth: Payer: Self-pay | Admitting: Cardiovascular Disease

## 2021-06-25 ENCOUNTER — Other Ambulatory Visit: Payer: Self-pay

## 2021-06-25 NOTE — Telephone Encounter (Signed)
Did not need this encounter °

## 2021-06-26 ENCOUNTER — Encounter: Payer: Self-pay | Admitting: Physician Assistant

## 2021-06-26 ENCOUNTER — Ambulatory Visit: Payer: PPO | Admitting: Physician Assistant

## 2021-06-26 VITALS — BP 122/82 | HR 79 | Ht 68.0 in | Wt 169.2 lb

## 2021-06-26 DIAGNOSIS — Z79899 Other long term (current) drug therapy: Secondary | ICD-10-CM | POA: Diagnosis not present

## 2021-06-26 DIAGNOSIS — R0609 Other forms of dyspnea: Secondary | ICD-10-CM

## 2021-06-26 DIAGNOSIS — I714 Abdominal aortic aneurysm, without rupture, unspecified: Secondary | ICD-10-CM

## 2021-06-26 DIAGNOSIS — I2510 Atherosclerotic heart disease of native coronary artery without angina pectoris: Secondary | ICD-10-CM | POA: Diagnosis not present

## 2021-06-26 DIAGNOSIS — E785 Hyperlipidemia, unspecified: Secondary | ICD-10-CM | POA: Diagnosis not present

## 2021-06-26 DIAGNOSIS — I10 Essential (primary) hypertension: Secondary | ICD-10-CM

## 2021-06-26 DIAGNOSIS — Z8673 Personal history of transient ischemic attack (TIA), and cerebral infarction without residual deficits: Secondary | ICD-10-CM

## 2021-06-26 DIAGNOSIS — I779 Disorder of arteries and arterioles, unspecified: Secondary | ICD-10-CM | POA: Diagnosis not present

## 2021-06-26 MED ORDER — FUROSEMIDE 20 MG PO TABS: 20.0000 mg | ORAL_TABLET | Freq: Every day | ORAL | 1 refills | Status: DC

## 2021-06-26 NOTE — Patient Instructions (Addendum)
Medication Instructions:  ?INCREASE Lasix to 20 mg daily ? ?*If you need a refill on your cardiac medications before your next appointment, please call your pharmacy* ? ?Lab Work: ?Your physician recommends that you return for lab work in 2 weeks:  ?BMET  ? ?If you have labs (blood work) drawn today and your tests are completely normal, you will receive your results only by: ?MyChart Message (if you have MyChart) OR ?A paper copy in the mail ?If you have any lab test that is abnormal or we need to change your treatment, we will call you to review the results. ? ?Testing/Procedures: ?Your physician has requested that you have an echocardiogram. Echocardiography is a painless test that uses sound waves to create images of your heart. It provides your doctor with information about the size and shape of your heart and how well your heart?s chambers and valves are working. This procedure takes approximately one hour. There are no restrictions for this procedure. ? ?Please schedule for 2 weeks at the [facility]  ? ?Follow-Up: ?At [facility], you and your health needs are our priority.  As part of our continuing mission to provide you with exceptional heart care, we have created designated Provider Care Teams.  These Care Teams include your primary Cardiologist (physician) and Advanced Practice Providers (APPs -  Physician Assistants and Nurse Practitioners) who all work together to provide you with the care you need, when you need it. ? ?We recommend signing up for the patient portal called "MyChart".  Sign up information is provided on this After Visit Summary.  MyChart is used to connect with patients for Virtual Visits (Telemedicine).  Patients are able to view lab/test results, encounter notes, upcoming appointments, etc.  Non-urgent messages can be sent to your provider as well.   ?To learn more about what you can do with MyChart, go to [email or web address].   ? ?Your next appointment:   ?2-3  month(s) ? ?The format for your next appointment:   ?In Person ? ?Provider:   ?Quay Burow, MD or Almyra Deforest, PA-C   ? ?Other Instructions ? ?

## 2021-06-26 NOTE — Progress Notes (Signed)
?Cardiology Office Note:   ? ?Date:  06/26/2021  ? ?ID:  [name] [name], DOB 01/12/1935, MRN [id number] ? ?PCP:  Kathyrn Drown, MD ?  ?[facility] Providers ?Cardiologist:  Quay Burow, MD    ? ?Referring MD: Kathyrn Drown, MD  ? ?Chief Complaint  ?Patient presents with  ? Follow-up  ? Shortness of Breath  ?  Seen for [name]  ? ? ?History of Present Illness:   ? ?[name] [name] is a 87 y.o. male with a hx of CAD, carotid artery disease with occluded left carotid artery, PAD, AAA, history of CVA, HTN and HLD.  He has a history of Q-wave infarction in 2002 with stent placed in PDA.  He was readmitted again April 2015 with anterior STEMI.  Presenting symptoms include a severe epigastric burning pain, indigestion and left arm pain.  He underwent aspiration thrombectomy, PCI and stenting of proximal LAD, EF 30%. At the time of emergent cardiac cath, he was also found to have significant PAD with occluded left iliac, high-grade right common iliac artery stenosis and small AAA. He underwent lower extremity angiography in 2015 with PTA and stenting of right common iliac artery stenosis.  He had a left sided CVA related to watershed infarct from hemodynamically significant right carotid artery stenosis in the setting of occluded left carotid artery.  Attempt was made to stent the right internal carotid artery in August 2017, however procedure was aborted due to degree of calcification and tortuosity.  He eventually underwent uncomplicated right carotid enterectomy by Dr. Trula Slade in August 2017.  ? ?Patient was admitted in November 2021 with progressive shortness of breath and intermittent chest discomfort.  Code STEMI was initially called however upon further evaluation, EKG does not appears to have changed.  Therefore code STEMI was canceled.  Echocardiogram obtained during the admission showed EF 50 to 55%, grade 2 DD.  Patient underwent IV diuresis due to sign volume overload.  His right arm pressures  tend to be 30 mmHg higher than the left side.  I suspected he had some degree of left subclavian artery stenosis.  He was readmitted in May 2022 for acute on chronic systolic heart failure in the setting of hypertensive emergency.  He underwent diuresis however developed AKI, therefore he was discharged on as needed Lasix.  Carotid Doppler obtained in July 2022 demonstrated a chronically occluded left ICA, slight progression of the right ICA stenosis, disturbed bilateral subclavian artery flow.  1 year repeat study was recommended.  ABI and a lower extremity Doppler in July 2022 demonstrated mild increasing abdominal aortic aneurysm up to 4 cm, greater than 50% stenosis seen bilateral iliac artery. ? ?CT of abdomen pelvis in August 2022 revealed a 2.4 x 2.3 cm mass.  CT of the chest revealed a 11 mm right retrocrural node.  CT-guided biopsy of the left periaortic lymph node revealed a large B-cell lymphoma.  PET scan in October 2022 revealed left supraclavicular and left upper mediastinal nodes, bulky hypermetabolic periaortic retroperitoneal nodes, single hypermetabolic metastasis in the liver, multiple hypermetabolic spleen lesion.  He has been treated with chemotherapy.  Repeat PET scan in January 2023 revealed resolution of lymphadenopathy in the left neck, supraclavicular region, left mediastinum, liver and the spleen lesion.  He is being followed by Dr. Benay Spice of oncology service.  He has mild anemia/thrombocytopenia that was felt to be due to chemotherapy.  He has been having increasing dyspnea based on the most recent oncology note on 3/28, he  was advised to follow-up with cardiology service.  Chest x-ray obtained last month revealed evidence of CHF. ? ?Patient presents today for evaluation of worsening dyspnea.  Patient's dyspnea on exertion has been going on for the past month.  He did take Lasix 20 mg twice daily instead of PRN as prescribed for the past month, however this did not help with his  worsening dyspnea.  On physical exam, he only has trace amount of lower extremity edema.  He does not have significant crackles or wheezing on physical exam.  I think he is only mildly volume overloaded, however his dyspnea on exertion is clearly out of proportion to the degree of volume overload.  I suspect his dyspnea has more related to deconditioning and recent cancer treatment.  I ambulated the patient in the hallway, after only walking 9 to 12 feet, his O2 saturation quickly dropped from 92% at rest down to 80% with exertion.  We put him on 2 L nasal cannula, O2 saturation stayed up near the high 90s.  I recommend home oxygen for this patient.  He will also need a complete echocardiogram.  I will place him on low-dose Lasix 20 mg daily.  He will need a 2-week basic metabolic panel.  He can follow-up in 2 to 3 months.  I suspect he will require oxygen for at least the next 6 months.  He will need oxygen on a as needed basis at night and during ambulation.  O2 saturation seems to be stable on the lower end of normal at rest. ? ? ?SATURATION QUALIFICATIONS: (This note is used to comply with regulatory documentation for home oxygen) ? ?Patient Saturations on Room Air at Rest = 92% ? ?Patient Saturations on Room Air while Ambulating = 80% ? ?Patient Saturations on 2 Liters of oxygen while Ambulating = 97% ? ?Please briefly explain why patient needs home oxygen: ?Significant drop in O2 sat after walking short distance ? ?Past Medical History:  ?Diagnosis Date  ? Arthritis   ? "all over" (09/04/2013)  ? Bleeds easily ([place])   ? d/t being on Plavix and ASA per pt  ? CAD (coronary artery disease) 2015  ? a. STEMI 2002 s/p stent to PDA. b. Anterior STEMI 06/2013 s/p  asp thrombectomy, DES to prox LAD, EF preserved.  ? Carotid artery disease ([place]) 10/2013  ?  Total occlusion of the LICA, 00-76^ stenosis of the RICA  ? Enlarged prostate   ? GERD (gastroesophageal reflux disease)   ? takes Pantoprazole daily  ? Hard of hearing    ? no hearing aides  ? History of colon polyps   ? benign  ? History of diverticulitis   ? History of shingles   ? Hyperlipidemia   ? takes Pravastatin daily  ? Hypertension   ? takes Amlodipine and Metoprolol daily  ? Joint pain   ? Joint swelling   ? Myocardial infarction [facility]) at age 60 and other 07/05/11  ? Nocturia   ? PVD (peripheral vascular disease) with claudication ([place]) 2015  ? a. 08/2013: s/p diamondback orbital rotational atherectomy and 8 mm x 30 mm long ICast covered stent to calcified ostial right common iliac artery. b. 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  ? Stroke [facility]) ~ 2012  ?  x 2:right side is weaker  ? Thin skin   ? Thrombocytopenia ([place])   ? Urinary frequency   ? ? ?Past Surgical History:  ?Procedure Laterality  Date  ? CAROTID ENDARTERECTOMY    ? CATARACT EXTRACTION W/ INTRAOCULAR LENS  IMPLANT, BILATERAL Bilateral ~ 2010  ? COLONOSCOPY    ? COLONOSCOPY  2016  ? multiple small adenomas, severe sigmoid diverticulosis.  ? CORONARY ANGIOPLASTY WITH STENT PLACEMENT  06/2013  ? "1"4  ? ENDARTERECTOMY Right 11/27/2015  ? Procedure: RIGHT  CAROTID ARTERY ENDARTERECTOMY;  Surgeon: Serafina Mitchell, MD;  Location: [facility] OR;  Service: Vascular;  Laterality: Right;  ? ESOPHAGOGASTRODUODENOSCOPY  2016  ? large 6 cm hiatal hernia with suspected mild Cameron lesion, erythematous gastritis, duodenal diverticulum. + H pylori gastritis. Treated with amixocillin, flagyl, clarithromycin. H.pylori stool antigen negative.  ? ILIAC ARTERY STENT Right 09/04/2013  ? 8 mm x 30 mm long ICast covered stent  ? ILIAC ARTERY STENT  09/2013  ? PTA and stenting of a left common iliac artery chronic total occlusion using a Viance CTO catheter and an ICast Covered stent  ? IR IMAGING GUIDED PORT INSERTION  01/10/2021  ? LEFT HEART CATHETERIZATION WITH CORONARY ANGIOGRAM N/A 07/02/2013  ? Procedure: LEFT HEART CATHETERIZATION WITH CORONARY ANGIOGRAM;  Surgeon: Lorretta Harp, MD;  Location: [facility] CATH LAB;  Service: Cardiovascular;  Laterality: N/A;  ? PATCH ANGIOPLASTY Right 11/27/2015  ? Procedure: WITH 1CM X 6CM XENOSURE BIOLOGIC PATCH ANGIOPLASTY;  Surgeon: Serafina Mitchell, Jerilynn Mages

## 2021-07-01 ENCOUNTER — Ambulatory Visit (HOSPITAL_COMMUNITY)
Admission: RE | Admit: 2021-07-01 | Discharge: 2021-07-01 | Disposition: A | Discharge status: Home / Self Care | Payer: PPO | Source: Ambulatory Visit | Attending: Oncology | Admitting: Oncology

## 2021-07-01 DIAGNOSIS — C8330 Diffuse large B-cell lymphoma, unspecified site: Secondary | ICD-10-CM | POA: Diagnosis not present

## 2021-07-01 DIAGNOSIS — C8510 Unspecified B-cell lymphoma, unspecified site: Secondary | ICD-10-CM

## 2021-07-01 DIAGNOSIS — Z452 Encounter for adjustment and management of vascular access device: Secondary | ICD-10-CM | POA: Insufficient documentation

## 2021-07-01 HISTORY — PX: IR REMOVAL TUN ACCESS W/ PORT W/O FL MOD SED: IMG2290

## 2021-07-01 MED ORDER — LIDOCAINE-EPINEPHRINE 1 %-1:100000 IJ SOLN
INTRAMUSCULAR | Status: AC
  Filled 2021-07-01: qty 1

## 2021-07-01 MED ORDER — LIDOCAINE-EPINEPHRINE 1 %-1:100000 IJ SOLN: 10.0000 mL | Freq: Once | INTRAMUSCULAR | Status: DC

## 2021-07-01 NOTE — Procedures (Signed)
Interventional Radiology Procedure Note ? ?Procedure: Port removal ? ?Complications: None ? ?Estimated Blood Loss: < 10 mL ? ?Findings: ?Right chest port removed utilizing 1% lidocaine for local anesthesia, sharp and blunt dissection. Entire port and catheter removed. Pocket closed with dual layer closure and Dermabond applied.  ? ?Eulas Post T. Kathlene Cote, M.D ?Pager:  [phone] ? ?  ?

## 2021-07-08 ENCOUNTER — Other Ambulatory Visit (HOSPITAL_BASED_OUTPATIENT_CLINIC_OR_DEPARTMENT_OTHER): Payer: PPO

## 2021-07-10 ENCOUNTER — Ambulatory Visit (INDEPENDENT_AMBULATORY_CARE_PROVIDER_SITE_OTHER): Payer: PPO

## 2021-07-10 DIAGNOSIS — R0609 Other forms of dyspnea: Secondary | ICD-10-CM

## 2021-07-10 LAB — ECHOCARDIOGRAM COMPLETE
AR max vel: 2.35 cm2
AV Area VTI: 2.19 cm2
AV Area mean vel: 2.08 cm2
AV Mean grad: 3 mmHg
AV Peak grad: 4.8 mmHg
AV Vena cont: 0.12 cm
Ao pk vel: 1.1 m/s
Area-P 1/2: 3.85 cm2
Calc EF: 38 %
MV VTI: 1.42 cm2
S' Lateral: 3.56 cm
Single Plane A2C EF: 40 %
Single Plane A4C EF: 35.3 %

## 2021-07-11 ENCOUNTER — Other Ambulatory Visit: Payer: Self-pay

## 2021-07-11 MED ORDER — FUROSEMIDE 20 MG PO TABS: 40.0000 mg | ORAL_TABLET | Freq: Every day | ORAL | 1 refills | Status: DC

## 2021-07-18 ENCOUNTER — Ambulatory Visit: Payer: PPO | Admitting: Physician Assistant

## 2021-07-18 VITALS — BP 179/88 | HR 72 | Ht 68.0 in | Wt 168.0 lb

## 2021-07-18 DIAGNOSIS — I2510 Atherosclerotic heart disease of native coronary artery without angina pectoris: Secondary | ICD-10-CM

## 2021-07-18 DIAGNOSIS — I10 Essential (primary) hypertension: Secondary | ICD-10-CM

## 2021-07-18 DIAGNOSIS — I5021 Acute systolic (congestive) heart failure: Secondary | ICD-10-CM | POA: Diagnosis not present

## 2021-07-18 DIAGNOSIS — E785 Hyperlipidemia, unspecified: Secondary | ICD-10-CM | POA: Diagnosis not present

## 2021-07-18 DIAGNOSIS — Z79899 Other long term (current) drug therapy: Secondary | ICD-10-CM | POA: Diagnosis not present

## 2021-07-18 MED ORDER — ENTRESTO 49-51 MG PO TABS
1.0000 | ORAL_TABLET | Freq: Two times a day (BID) | ORAL | 2 refills | Status: DC
Start: 2021-07-18 — End: 2021-09-29

## 2021-07-18 MED ORDER — FUROSEMIDE 20 MG PO TABS: 20.0000 mg | ORAL_TABLET | Freq: Every day | ORAL | 3 refills | Status: DC

## 2021-07-18 NOTE — Patient Instructions (Addendum)
Medication Instructions:  ?STOP Amlodipine  ?START Entresto 49-51 mg 2 times a day  ?DECREASE Lasix to 20 mg daily ?*If you need a refill on your cardiac medications before your next appointment, please call your pharmacy* ? ?Lab Work: ?Your physician recommends that you return for lab work TODAY:  ?BMET  ? ?Your physician recommends that you return for lab work in 1 week:  ?BMET  ? ?If you have labs (blood work) drawn today and your tests are completely normal, you will receive your results only by: ?MyChart Message (if you have MyChart) OR ?A paper copy in the mail ?If you have any lab test that is abnormal or we need to change your treatment, we will call you to review the results. ? ?Testing/Procedures: ?NONE ordered at this time of appointment  ? ?Follow-Up: ?At [facility], you and your health needs are our priority.  As part of our continuing mission to provide you with exceptional heart care, we have created designated Provider Care Teams.  These Care Teams include your primary Cardiologist (physician) and Advanced Practice Providers (APPs -  Physician Assistants and Nurse Practitioners) who all work together to provide you with the care you need, when you need it. ? ?We recommend signing up for the patient portal called "MyChart".  Sign up information is provided on this After Visit Summary.  MyChart is used to connect with patients for Virtual Visits (Telemedicine).  Patients are able to view lab/test results, encounter notes, upcoming appointments, etc.  Non-urgent messages can be sent to your provider as well.   ?To learn more about what you can do with MyChart, go to [email or web address].   ? ?Your next appointment:   ?3-4 week(s) ? ?The format for your next appointment:   ?In Person ? ?Provider:   ?Almyra Deforest, PA-C    Then, Quay Burow, MD will plan to see you again in 2 month(s).  ? ?Other Instructions ? ? ?Important Information About Sugar ? ? ? ? ? ? ?

## 2021-07-18 NOTE — Progress Notes (Signed)
?Cardiology Office Note:   ? ?Date:  07/20/2021  ? ?ID:  [name] [name], DOB April 23, 1934, MRN [id number] ? ?PCP:  Kathyrn Drown, MD ?  ?[facility] Providers ?Cardiologist:  Quay Burow, MD    ? ?Referring MD: Kathyrn Drown, MD  ? ?Chief Complaint  ?Patient presents with  ? Follow-up  ?  Seen for [name]  ? ? ?History of Present Illness:   ? ?[name] [name] is a 87 y.o. male with a hx of CAD, carotid artery disease with occluded left carotid artery, PAD, AAA, history of CVA, HTN and HLD.  He has a history of Q-wave infarction in 2002 with stent placed in PDA.  He was readmitted again April 2015 with anterior STEMI.  Presenting symptoms include a severe epigastric burning pain, indigestion and left arm pain.  He underwent aspiration thrombectomy, PCI and stenting of proximal LAD, EF 30%. At the time of emergent cardiac cath, he was also found to have significant PAD with occluded left iliac, high-grade right common iliac artery stenosis and small AAA. He underwent lower extremity angiography in 2015 with PTA and stenting of right common iliac artery stenosis.  He had a left sided CVA related to watershed infarct from hemodynamically significant right carotid artery stenosis in the setting of occluded left carotid artery.  Attempt was made to stent the right internal carotid artery in August 2017, however procedure was aborted due to degree of calcification and tortuosity.  He eventually underwent uncomplicated right carotid enterectomy by Dr. Trula Slade in August 2017.  ?  ?Patient was admitted in November 2021 with progressive shortness of breath and intermittent chest discomfort.  Code STEMI was initially called however upon further evaluation, EKG does not appears to have changed.  Therefore code STEMI was canceled.  Echocardiogram obtained during the admission showed EF 50 to 55%, grade 2 DD.  Patient underwent IV diuresis due to sign volume overload.  His right arm pressures tend to be 30 mmHg  higher than the left side.  I suspected he had some degree of left subclavian artery stenosis.  He was readmitted in May 2022 for acute on chronic systolic heart failure in the setting of hypertensive emergency.  He underwent diuresis however developed AKI, therefore he was discharged on as needed Lasix.  Carotid Doppler obtained in July 2022 demonstrated a chronically occluded left ICA, slight progression of the right ICA stenosis, disturbed bilateral subclavian artery flow.  1 year repeat study was recommended.  ABI and a lower extremity Doppler in July 2022 demonstrated mild increasing abdominal aortic aneurysm up to 4 cm, greater than 50% stenosis seen bilateral iliac artery. ?  ?CT of abdomen pelvis in August 2022 revealed a 2.4 x 2.3 cm mass.  CT of the chest revealed a 11 mm right retrocrural node.  CT-guided biopsy of the left periaortic lymph node revealed a large B-cell lymphoma.  PET scan in October 2022 revealed left supraclavicular and left upper mediastinal nodes, bulky hypermetabolic periaortic retroperitoneal nodes, single hypermetabolic metastasis in the liver, multiple hypermetabolic spleen lesion.  He has been treated with chemotherapy.  Repeat PET scan in January 2023 revealed resolution of lymphadenopathy in the left neck, supraclavicular region, left mediastinum, liver and the spleen lesion.  He is being followed by Dr. Benay Spice of oncology service.  He has mild anemia/thrombocytopenia that was felt to be due to chemotherapy.  He has been having increasing dyspnea based on the most recent oncology note on 3/28, he was advised to  follow-up with cardiology service.  Chest x-ray obtained last month revealed evidence of CHF. ? ?I last saw the patient on 06/26/2021 for worsening dyspnea for the past month.  Instead of as needed Lasix, he has been taking Lasix daily.  On physical exam, he had trace amount of lower extremity edema.  I ambulated the patient in the hallway, O2 saturation dropped down to  80% with minimal exertion.  We started the patient on home oxygen.  I instructed him to continue on the low-dose 20 mg Lasix on a daily basis.  Repeat echocardiogram obtained on 07/10/2021 demonstrated EF dropped down to 25 to 30%, severe asymmetric LVH, grade 3 diastolic dysfunction, trivial MR, moderately reduced RVEF, dilated aortic root at 42 mm, dilated ascending aorta at 46 mm, patient has large pleural effusion on the both left and the right side.  Based on the finding from the echocardiogram, I did increase his Lasix to 40 mg daily. ? ?Patient presents today along with his daughter.  He denies any recent chest discomfort that is reminiscent of the previous angina.  Lower extremity edema has completely resolved.  He is breathing is better after going on the higher dose of Lasix as well.  I discussed the case with [name], he is not a good cath candidate, given lack of chest discomfort, we recommended up titration of heart failure therapy first and repeat echocardiogram in 3 months.  I discontinued his amlodipine and switched him to Entresto 49-51 mg twice a day.  I will decrease his Lasix to 20 mg daily with addition of Entresto.  He will get a basic metabolic panel today and also in 1 week.  I we will discontinue amlodipine to add Entresto.  I plan to see the patient back in a few weeks to further uptitrate the Entresto.  I initially also wanted to add Wilder Glade, however we will hold off on doing so until the next visit.  If he has enough blood pressure room, I likely will uptitrate the Entresto and potentially add a spironolactone as well.  On physical exam, he has diminished breath sound in the left base but clear right lung.  Was heart failure therapy is fully titrated, I plan to repeat echocardiogram in 3 months. ? ? ?Past Medical History:  ?Diagnosis Date  ? Arthritis   ? "all over" (09/04/2013)  ? Bleeds easily ([place])   ? d/t being on Plavix and ASA per pt  ? CAD (coronary artery disease) 2015  ? a. STEMI  2002 s/p stent to PDA. b. Anterior STEMI 06/2013 s/p  asp thrombectomy, DES to prox LAD, EF preserved.  ? Carotid artery disease ([place]) 10/2013  ?  Total occlusion of the LICA, 59-16^ stenosis of the RICA  ? Enlarged prostate   ? GERD (gastroesophageal reflux disease)   ? takes Pantoprazole daily  ? Hard of hearing   ? no hearing aides  ? History of colon polyps   ? benign  ? History of diverticulitis   ? History of shingles   ? Hyperlipidemia   ? takes Pravastatin daily  ? Hypertension   ? takes Amlodipine and Metoprolol daily  ? Joint pain   ? Joint swelling   ? Myocardial infarction [facility]) at age 77 and other 07/05/11  ? Nocturia   ? PVD (peripheral vascular disease) with claudication ([place]) 2015  ? a. 08/2013: s/p diamondback orbital rotational atherectomy and 8 mm x 30 mm long ICast covered stent to calcified ostial right common iliac  artery. b. 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  ? Stroke [facility]) ~ 2012  ?  x 2:right side is weaker  ? Thin skin   ? Thrombocytopenia ([place])   ? Urinary frequency   ? ? ?Past Surgical History:  ?Procedure Laterality Date  ? CAROTID ENDARTERECTOMY    ? CATARACT EXTRACTION W/ INTRAOCULAR LENS  IMPLANT, BILATERAL Bilateral ~ 2010  ? COLONOSCOPY    ? COLONOSCOPY  2016  ? multiple small adenomas, severe sigmoid diverticulosis.  ? CORONARY ANGIOPLASTY WITH STENT PLACEMENT  06/2013  ? "1"4  ? ENDARTERECTOMY Right 11/27/2015  ? Procedure: RIGHT  CAROTID ARTERY ENDARTERECTOMY;  Surgeon: Serafina Mitchell, MD;  Location: [facility] OR;  Service: Vascular;  Laterality: Right;  ? ESOPHAGOGASTRODUODENOSCOPY  2016  ? large 6 cm hiatal hernia with suspected mild Cameron lesion, erythematous gastritis, duodenal diverticulum. + H pylori gastritis. Treated with amixocillin, flagyl, clarithromycin. H.pylori stool antigen negative.  ? ILIAC ARTERY STENT Right 09/04/2013  ? 8 mm x 30 mm long ICast covered stent  ? ILIAC ARTERY STENT  09/2013  ? PTA and stenting  of a left common iliac artery chronic total occlusion using a Viance CTO catheter and an ICast Covered stent  ? IR IMAGING GUIDED PORT INSERTION  01/10/2021  ? IR REMOVAL TUN ACCESS W/ PORT W/O FL MOD SED

## 2021-07-19 ENCOUNTER — Encounter: Payer: Self-pay | Admitting: Oncology

## 2021-07-19 LAB — BASIC METABOLIC PANEL
BUN/Creatinine Ratio: 16 (ref 10–24)
BUN: 19 mg/dL (ref 8–27)
CO2: 23 mmol/L (ref 20–29)
Calcium: 8.8 mg/dL (ref 8.6–10.2)
Chloride: 102 mmol/L (ref 96–106)
Creatinine, Ser: 1.17 mg/dL (ref 0.76–1.27)
Glucose: 91 mg/dL (ref 70–99)
Potassium: 4.5 mmol/L (ref 3.5–5.2)
Sodium: 140 mmol/L (ref 134–144)
eGFR: 61 mL/min/{1.73_m2} (ref >59)

## 2021-07-20 ENCOUNTER — Encounter: Payer: Self-pay | Admitting: Physician Assistant

## 2021-07-24 NOTE — Progress Notes (Signed)
Stable renal function and electrolyte

## 2021-07-25 ENCOUNTER — Encounter: Payer: Self-pay | Admitting: *Deleted

## 2021-07-29 DIAGNOSIS — R0609 Other forms of dyspnea: Secondary | ICD-10-CM | POA: Diagnosis not present

## 2021-08-22 ENCOUNTER — Ambulatory Visit: Payer: PPO | Admitting: Physician Assistant
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
## 2021-08-26 ENCOUNTER — Encounter: Payer: Self-pay | Admitting: Physician Assistant

## 2021-08-26 ENCOUNTER — Ambulatory Visit: Payer: PPO | Admitting: Physician Assistant

## 2021-08-26 VITALS — BP 137/80 | HR 60 | Ht 68.0 in | Wt 165.8 lb

## 2021-08-26 DIAGNOSIS — I2510 Atherosclerotic heart disease of native coronary artery without angina pectoris: Secondary | ICD-10-CM | POA: Diagnosis not present

## 2021-08-26 DIAGNOSIS — I10 Essential (primary) hypertension: Secondary | ICD-10-CM | POA: Diagnosis not present

## 2021-08-26 DIAGNOSIS — Z9861 Coronary angioplasty status: Secondary | ICD-10-CM | POA: Diagnosis not present

## 2021-08-26 DIAGNOSIS — I5042 Chronic combined systolic (congestive) and diastolic (congestive) heart failure: Secondary | ICD-10-CM

## 2021-08-26 DIAGNOSIS — E785 Hyperlipidemia, unspecified: Secondary | ICD-10-CM | POA: Diagnosis not present

## 2021-08-26 MED ORDER — DAPAGLIFLOZIN PROPANEDIOL 10 MG PO TABS: 10.0000 mg | ORAL_TABLET | Freq: Every day | ORAL | 3 refills | Status: DC

## 2021-08-26 NOTE — Patient Instructions (Addendum)
Medication Instructions:  Decrease Hydralazine 25 mg ( Take 1 Tablet Twice Daily). Start Farxiga 10 ( 1 Tablet Daily). *If you need a refill on your cardiac medications before your next appointment, please call your pharmacy*   Lab Work: BMET If you have labs (blood work) drawn today and your tests are completely normal, you will receive your results only by: [place] (if you have MyChart) OR A paper copy in the mail If you have any lab test that is abnormal or we need to change your treatment, we will call you to review the results.   Testing/Procedures: [address], [address]. Your physician has requested that you have an echocardiogram. Echocardiography is a painless test that uses sound waves to create images of your heart. It provides your doctor with information about the size and shape of your heart and how well your heart's chambers and valves are working. This procedure takes approximately one hour. There are no restrictions for this procedure.    Follow-Up: At [facility], you and your health needs are our priority.  As part of our continuing mission to provide you with exceptional heart care, we have created designated Provider Care Teams.  These Care Teams include your primary Cardiologist (physician) and Advanced Practice Providers (APPs -  Physician Assistants and Nurse Practitioners) who all work together to provide you with the care you need, when you need it.  We recommend signing up for the patient portal called "MyChart".  Sign up information is provided on this After Visit Summary.  MyChart is used to connect with patients for Virtual Visits (Telemedicine).  Patients are able to view lab/test results, encounter notes, upcoming appointments, etc.  Non-urgent messages can be sent to your provider as well.   To learn more about what you can do with MyChart, go to [email or web address].    Your next appointment:   Keep Scheduled Appointment  The  format for your next appointment:   In Person  Provider:   Quay Burow, MD       Important Information About Sugar

## 2021-08-27 ENCOUNTER — Encounter: Payer: Self-pay | Admitting: Oncology

## 2021-08-27 LAB — BASIC METABOLIC PANEL
BUN/Creatinine Ratio: 20 (ref 10–24)
BUN: 34 mg/dL — ABNORMAL HIGH (ref 8–27)
CO2: 19 mmol/L — ABNORMAL LOW (ref 20–29)
Calcium: 8.8 mg/dL (ref 8.6–10.2)
Chloride: 105 mmol/L (ref 96–106)
Creatinine, Ser: 1.67 mg/dL — ABNORMAL HIGH (ref 0.76–1.27)
Glucose: 111 mg/dL — ABNORMAL HIGH (ref 70–99)
Potassium: 5.1 mmol/L (ref 3.5–5.2)
Sodium: 138 mmol/L (ref 134–144)
eGFR: 40 mL/min/{1.73_m2} — ABNORMAL LOW (ref >59)

## 2021-09-03 ENCOUNTER — Ambulatory Visit: Payer: PPO | Admitting: Cardiovascular Disease

## 2021-09-06 ENCOUNTER — Other Ambulatory Visit: Payer: Self-pay | Admitting: Family Medicine

## 2021-09-10 ENCOUNTER — Ambulatory Visit: Payer: PPO | Admitting: Family Medicine

## 2021-09-17 ENCOUNTER — Ambulatory Visit (INDEPENDENT_AMBULATORY_CARE_PROVIDER_SITE_OTHER): Payer: PPO | Admitting: Family Medicine

## 2021-09-17 VITALS — BP 118/76 | Ht 68.0 in | Wt 167.8 lb

## 2021-09-17 DIAGNOSIS — I10 Essential (primary) hypertension: Secondary | ICD-10-CM | POA: Diagnosis not present

## 2021-09-17 NOTE — Progress Notes (Signed)
   Subjective:    Patient ID: [name] [name], male    DOB: [name] 31, 1935, 87 y.o.   MRN: [id number]  Hypertension This is a chronic problem. Risk factors for coronary artery disease include dyslipidemia and male gender. Treatments tried: coreg, lasix, apresoline. There are no compliance problems.   Patient has underlying vascular issues Also sees cancer specialists for recent lymphoma issues Doing well overall Eating okay energy level okay no recent falls denies being depressed    Review of Systems     Objective:   Physical Exam General-in no acute distress Eyes-no discharge Lungs-respiratory rate normal, CTA CV-no murmurs,RRR Extremities skin warm dry no edema Neuro grossly normal Behavior normal, alert        Assessment & Plan:  Blood pressure amazingly good continue current measures Healthy diet recommended Stay physically active Follow-up by fall time

## 2021-09-18 ENCOUNTER — Inpatient Hospital Stay: Payer: PPO | Admitting: Oncology

## 2021-09-18 ENCOUNTER — Inpatient Hospital Stay: Payer: PPO | Attending: Oncology

## 2021-09-18 VITALS — BP 130/92 | HR 74 | Temp 98.2°F | Resp 18 | Ht 68.0 in | Wt 166.0 lb

## 2021-09-18 DIAGNOSIS — L989 Disorder of the skin and subcutaneous tissue, unspecified: Secondary | ICD-10-CM | POA: Diagnosis not present

## 2021-09-18 DIAGNOSIS — C8510 Unspecified B-cell lymphoma, unspecified site: Secondary | ICD-10-CM

## 2021-09-18 DIAGNOSIS — I2510 Atherosclerotic heart disease of native coronary artery without angina pectoris: Secondary | ICD-10-CM | POA: Insufficient documentation

## 2021-09-18 DIAGNOSIS — I110 Hypertensive heart disease with heart failure: Secondary | ICD-10-CM | POA: Insufficient documentation

## 2021-09-18 DIAGNOSIS — D509 Iron deficiency anemia, unspecified: Secondary | ICD-10-CM | POA: Diagnosis not present

## 2021-09-18 DIAGNOSIS — I509 Heart failure, unspecified: Secondary | ICD-10-CM | POA: Diagnosis not present

## 2021-09-18 DIAGNOSIS — I739 Peripheral vascular disease, unspecified: Secondary | ICD-10-CM | POA: Insufficient documentation

## 2021-09-18 DIAGNOSIS — C8338 Diffuse large B-cell lymphoma, lymph nodes of multiple sites: Secondary | ICD-10-CM | POA: Insufficient documentation

## 2021-09-18 DIAGNOSIS — G629 Polyneuropathy, unspecified: Secondary | ICD-10-CM | POA: Insufficient documentation

## 2021-09-18 LAB — CMP (CANCER CENTER ONLY)
ALT: 8 U/L (ref 0–44)
AST: 11 U/L — ABNORMAL LOW (ref 15–41)
Albumin: 4 g/dL (ref 3.5–5.0)
Alkaline Phosphatase: 65 U/L (ref 38–126)
Anion gap: 10 (ref 5–15)
BUN: 33 mg/dL — ABNORMAL HIGH (ref 8–23)
CO2: 21 mmol/L — ABNORMAL LOW (ref 22–32)
Calcium: 8.7 mg/dL — ABNORMAL LOW (ref 8.9–10.3)
Chloride: 108 mmol/L (ref 98–111)
Creatinine: 1.55 mg/dL — ABNORMAL HIGH (ref 0.61–1.24)
GFR, Estimated: 44 mL/min — ABNORMAL LOW (ref >60)
Glucose, Bld: 106 mg/dL — ABNORMAL HIGH (ref 70–99)
Potassium: 5.1 mmol/L (ref 3.5–5.1)
Sodium: 139 mmol/L (ref 135–145)
Total Bilirubin: 0.3 mg/dL (ref 0.3–1.2)
Total Protein: 6.4 g/dL — ABNORMAL LOW (ref 6.5–8.1)

## 2021-09-18 LAB — CBC WITH DIFFERENTIAL (CANCER CENTER ONLY)
Abs Immature Granulocytes: 0.02 10*3/uL (ref 0.00–0.07)
Basophils Absolute: 0 10*3/uL (ref 0.0–0.1)
Basophils Relative: 1 %
Eosinophils Absolute: 0.4 10*3/uL (ref 0.0–0.5)
Eosinophils Relative: 5 %
HCT: 39.1 % (ref 39.0–52.0)
Hemoglobin: 11.8 g/dL — ABNORMAL LOW (ref 13.0–17.0)
Immature Granulocytes: 0 %
Lymphocytes Relative: 8 %
Lymphs Abs: 0.6 10*3/uL — ABNORMAL LOW (ref 0.7–4.0)
MCH: 23 pg — ABNORMAL LOW (ref 26.0–34.0)
MCHC: 30.2 g/dL (ref 30.0–36.0)
MCV: 76.1 fL — ABNORMAL LOW (ref 80.0–100.0)
Monocytes Absolute: 0.7 10*3/uL (ref 0.1–1.0)
Monocytes Relative: 10 %
Neutro Abs: 6 10*3/uL (ref 1.7–7.7)
Neutrophils Relative %: 76 %
Platelet Count: 109 10*3/uL — ABNORMAL LOW (ref 150–400)
RBC: 5.14 MIL/uL (ref 4.22–5.81)
RDW: 21.8 % — ABNORMAL HIGH (ref 11.5–15.5)
WBC Count: 7.8 10*3/uL (ref 4.0–10.5)
nRBC: 0 % (ref 0.0–0.2)

## 2021-09-18 LAB — LACTATE DEHYDROGENASE: LDH: 179 U/L (ref 98–192)

## 2021-09-25 ENCOUNTER — Emergency Department (HOSPITAL_COMMUNITY): Payer: PPO

## 2021-09-25 ENCOUNTER — Other Ambulatory Visit: Payer: Self-pay

## 2021-09-25 ENCOUNTER — Inpatient Hospital Stay (HOSPITAL_COMMUNITY)
Admission: EM | Admit: 2021-09-25 | Discharge: 2021-09-29 | DRG: 872 | Disposition: A | Discharge status: Home Health | Payer: PPO | Attending: Internal Medicine | Admitting: Internal Medicine

## 2021-09-25 DIAGNOSIS — R531 Weakness: Secondary | ICD-10-CM

## 2021-09-25 DIAGNOSIS — Z79899 Other long term (current) drug therapy: Secondary | ICD-10-CM | POA: Diagnosis not present

## 2021-09-25 DIAGNOSIS — R6510 Systemic inflammatory response syndrome (SIRS) of non-infectious origin without acute organ dysfunction: Secondary | ICD-10-CM | POA: Diagnosis not present

## 2021-09-25 DIAGNOSIS — Z83438 Family history of other disorder of lipoprotein metabolism and other lipidemia: Secondary | ICD-10-CM

## 2021-09-25 DIAGNOSIS — Z833 Family history of diabetes mellitus: Secondary | ICD-10-CM | POA: Diagnosis not present

## 2021-09-25 DIAGNOSIS — Z7902 Long term (current) use of antithrombotics/antiplatelets: Secondary | ICD-10-CM | POA: Diagnosis not present

## 2021-09-25 DIAGNOSIS — A419 Sepsis, unspecified organism: Secondary | ICD-10-CM | POA: Diagnosis present

## 2021-09-25 DIAGNOSIS — I252 Old myocardial infarction: Secondary | ICD-10-CM

## 2021-09-25 DIAGNOSIS — R000 Tachycardia, unspecified: Secondary | ICD-10-CM | POA: Diagnosis not present

## 2021-09-25 DIAGNOSIS — N390 Urinary tract infection, site not specified: Secondary | ICD-10-CM | POA: Diagnosis present

## 2021-09-25 DIAGNOSIS — D6959 Other secondary thrombocytopenia: Secondary | ICD-10-CM | POA: Diagnosis not present

## 2021-09-25 DIAGNOSIS — I739 Peripheral vascular disease, unspecified: Secondary | ICD-10-CM | POA: Diagnosis not present

## 2021-09-25 DIAGNOSIS — Z809 Family history of malignant neoplasm, unspecified: Secondary | ICD-10-CM | POA: Diagnosis not present

## 2021-09-25 DIAGNOSIS — R0689 Other abnormalities of breathing: Secondary | ICD-10-CM | POA: Diagnosis not present

## 2021-09-25 DIAGNOSIS — R6889 Other general symptoms and signs: Principal | ICD-10-CM

## 2021-09-25 DIAGNOSIS — Z7982 Long term (current) use of aspirin: Secondary | ICD-10-CM

## 2021-09-25 DIAGNOSIS — Z955 Presence of coronary angioplasty implant and graft: Secondary | ICD-10-CM

## 2021-09-25 DIAGNOSIS — N3000 Acute cystitis without hematuria: Secondary | ICD-10-CM | POA: Diagnosis present

## 2021-09-25 DIAGNOSIS — J9811 Atelectasis: Secondary | ICD-10-CM | POA: Diagnosis not present

## 2021-09-25 DIAGNOSIS — E782 Mixed hyperlipidemia: Secondary | ICD-10-CM | POA: Diagnosis not present

## 2021-09-25 DIAGNOSIS — N3001 Acute cystitis with hematuria: Secondary | ICD-10-CM

## 2021-09-25 DIAGNOSIS — K219 Gastro-esophageal reflux disease without esophagitis: Secondary | ICD-10-CM | POA: Diagnosis not present

## 2021-09-25 DIAGNOSIS — N179 Acute kidney failure, unspecified: Secondary | ICD-10-CM | POA: Diagnosis present

## 2021-09-25 DIAGNOSIS — I2510 Atherosclerotic heart disease of native coronary artery without angina pectoris: Secondary | ICD-10-CM | POA: Diagnosis present

## 2021-09-25 DIAGNOSIS — R509 Fever, unspecified: Secondary | ICD-10-CM | POA: Diagnosis not present

## 2021-09-25 DIAGNOSIS — I5082 Biventricular heart failure: Secondary | ICD-10-CM | POA: Diagnosis present

## 2021-09-25 DIAGNOSIS — I5042 Chronic combined systolic (congestive) and diastolic (congestive) heart failure: Secondary | ICD-10-CM | POA: Diagnosis present

## 2021-09-25 DIAGNOSIS — N1832 Chronic kidney disease, stage 3b: Secondary | ICD-10-CM | POA: Diagnosis not present

## 2021-09-25 DIAGNOSIS — E785 Hyperlipidemia, unspecified: Secondary | ICD-10-CM | POA: Diagnosis not present

## 2021-09-25 DIAGNOSIS — Z87891 Personal history of nicotine dependence: Secondary | ICD-10-CM

## 2021-09-25 DIAGNOSIS — Z8572 Personal history of non-Hodgkin lymphomas: Secondary | ICD-10-CM

## 2021-09-25 DIAGNOSIS — I255 Ischemic cardiomyopathy: Secondary | ICD-10-CM | POA: Diagnosis present

## 2021-09-25 DIAGNOSIS — J811 Chronic pulmonary edema: Secondary | ICD-10-CM | POA: Diagnosis not present

## 2021-09-25 DIAGNOSIS — A4151 Sepsis due to Escherichia coli [E. coli]: Principal | ICD-10-CM | POA: Diagnosis present

## 2021-09-25 DIAGNOSIS — Z8249 Family history of ischemic heart disease and other diseases of the circulatory system: Secondary | ICD-10-CM

## 2021-09-25 DIAGNOSIS — N400 Enlarged prostate without lower urinary tract symptoms: Secondary | ICD-10-CM | POA: Diagnosis present

## 2021-09-25 DIAGNOSIS — Z8673 Personal history of transient ischemic attack (TIA), and cerebral infarction without residual deficits: Secondary | ICD-10-CM | POA: Diagnosis not present

## 2021-09-25 DIAGNOSIS — Z9221 Personal history of antineoplastic chemotherapy: Secondary | ICD-10-CM

## 2021-09-25 DIAGNOSIS — I700 Atherosclerosis of aorta: Secondary | ICD-10-CM | POA: Diagnosis not present

## 2021-09-25 DIAGNOSIS — Z20822 Contact with and (suspected) exposure to covid-19: Secondary | ICD-10-CM | POA: Diagnosis present

## 2021-09-25 DIAGNOSIS — I7143 Infrarenal abdominal aortic aneurysm, without rupture: Secondary | ICD-10-CM

## 2021-09-25 DIAGNOSIS — I714 Abdominal aortic aneurysm, without rupture, unspecified: Secondary | ICD-10-CM | POA: Diagnosis present

## 2021-09-25 DIAGNOSIS — I130 Hypertensive heart and chronic kidney disease with heart failure and stage 1 through stage 4 chronic kidney disease, or unspecified chronic kidney disease: Secondary | ICD-10-CM | POA: Diagnosis present

## 2021-09-25 DIAGNOSIS — R5381 Other malaise: Secondary | ICD-10-CM | POA: Diagnosis present

## 2021-09-25 DIAGNOSIS — J189 Pneumonia, unspecified organism: Secondary | ICD-10-CM | POA: Diagnosis not present

## 2021-09-25 DIAGNOSIS — I10 Essential (primary) hypertension: Secondary | ICD-10-CM | POA: Diagnosis not present

## 2021-09-25 LAB — URINALYSIS, ROUTINE W REFLEX MICROSCOPIC
Bilirubin Urine: NEGATIVE
Glucose, UA: NEGATIVE mg/dL
Ketones, ur: NEGATIVE mg/dL
Nitrite: NEGATIVE
Protein, ur: 30 mg/dL — AB
Specific Gravity, Urine: 1.016 (ref 1.005–1.030)
pH: 5 (ref 5.0–8.0)

## 2021-09-25 LAB — BRAIN NATRIURETIC PEPTIDE: B Natriuretic Peptide: 2061 pg/mL — ABNORMAL HIGH (ref 0.0–100.0)

## 2021-09-25 LAB — COMPREHENSIVE METABOLIC PANEL
ALT: 10 U/L (ref 0–44)
AST: 17 U/L (ref 15–41)
Albumin: 3.7 g/dL (ref 3.5–5.0)
Alkaline Phosphatase: 71 U/L (ref 38–126)
Anion gap: 11 (ref 5–15)
BUN: 40 mg/dL — ABNORMAL HIGH (ref 8–23)
CO2: 19 mmol/L — ABNORMAL LOW (ref 22–32)
Calcium: 8.6 mg/dL — ABNORMAL LOW (ref 8.9–10.3)
Chloride: 108 mmol/L (ref 98–111)
Creatinine, Ser: 1.76 mg/dL — ABNORMAL HIGH (ref 0.61–1.24)
GFR, Estimated: 38 mL/min — ABNORMAL LOW (ref >60)
Glucose, Bld: 146 mg/dL — ABNORMAL HIGH (ref 70–99)
Potassium: 4.7 mmol/L (ref 3.5–5.1)
Sodium: 138 mmol/L (ref 135–145)
Total Bilirubin: 0.3 mg/dL (ref 0.3–1.2)
Total Protein: 6.3 g/dL — ABNORMAL LOW (ref 6.5–8.1)

## 2021-09-25 LAB — CBC WITH DIFFERENTIAL/PLATELET
Abs Immature Granulocytes: 0.07 10*3/uL (ref 0.00–0.07)
Basophils Absolute: 0 10*3/uL (ref 0.0–0.1)
Basophils Relative: 0 %
Eosinophils Absolute: 0.1 10*3/uL (ref 0.0–0.5)
Eosinophils Relative: 1 %
HCT: 43.2 % (ref 39.0–52.0)
Hemoglobin: 13.5 g/dL (ref 13.0–17.0)
Immature Granulocytes: 1 %
Lymphocytes Relative: 3 %
Lymphs Abs: 0.2 10*3/uL — ABNORMAL LOW (ref 0.7–4.0)
MCH: 24.4 pg — ABNORMAL LOW (ref 26.0–34.0)
MCHC: 31.3 g/dL (ref 30.0–36.0)
MCV: 78 fL — ABNORMAL LOW (ref 80.0–100.0)
Monocytes Absolute: 0.1 10*3/uL (ref 0.1–1.0)
Monocytes Relative: 1 %
Neutro Abs: 6.4 10*3/uL (ref 1.7–7.7)
Neutrophils Relative %: 94 %
Platelets: 87 10*3/uL — ABNORMAL LOW (ref 150–400)
RBC: 5.54 MIL/uL (ref 4.22–5.81)
RDW: 22.3 % — ABNORMAL HIGH (ref 11.5–15.5)
WBC: 6.8 10*3/uL (ref 4.0–10.5)
nRBC: 0 % (ref 0.0–0.2)

## 2021-09-25 LAB — PROTIME-INR
INR: 1.2 (ref 0.8–1.2)
Prothrombin Time: 14.7 seconds (ref 11.4–15.2)

## 2021-09-25 LAB — LACTIC ACID, PLASMA: Lactic Acid, Venous: 1.5 mmol/L (ref 0.5–1.9)

## 2021-09-25 LAB — APTT: aPTT: 29 seconds (ref 24–36)

## 2021-09-25 LAB — SARS CORONAVIRUS 2 BY RT PCR: SARS Coronavirus 2 by RT PCR: NEGATIVE

## 2021-09-25 MED ORDER — ACETAMINOPHEN 325 MG PO TABS
650.0000 mg | ORAL_TABLET | Freq: Four times a day (QID) | ORAL | Status: DC | PRN
  Administered 2021-09-26: 650 mg via ORAL
  Filled 2021-09-25: qty 2

## 2021-09-25 MED ORDER — SODIUM CHLORIDE 0.9 % IV SOLN: 1.0000 g | INTRAVENOUS | Status: DC

## 2021-09-25 MED ORDER — SODIUM CHLORIDE 0.9 % IV SOLN
1.0000 g | Freq: Once | INTRAVENOUS | Status: AC
  Administered 2021-09-25: 1 g via INTRAVENOUS
  Filled 2021-09-25: qty 10

## 2021-09-25 MED ORDER — LACTATED RINGERS IV BOLUS (SEPSIS)
500.0000 mL | Freq: Once | INTRAVENOUS | Status: AC
  Administered 2021-09-25: 500 mL via INTRAVENOUS

## 2021-09-25 MED ORDER — ACETAMINOPHEN 650 MG RE SUPP: 650.0000 mg | Freq: Four times a day (QID) | RECTAL | Status: DC | PRN

## 2021-09-25 MED ORDER — FUROSEMIDE 10 MG/ML IJ SOLN
20.0000 mg | Freq: Once | INTRAMUSCULAR | Status: AC
  Administered 2021-09-25: 20 mg via INTRAVENOUS
  Filled 2021-09-25: qty 2

## 2021-09-25 NOTE — ED Triage Notes (Signed)
Ems called for "tremors". Upon arrival, rr 30 hr 110, fever 101. Remission of Hoskins lymphoma. Took tylenol this morning. 20 LAC.

## 2021-09-25 NOTE — ED Provider Notes (Signed)
[facility] EMERGENCY DEPARTMENT Provider Note   CSN: [id number] Arrival date & time: 09/25/21  1639     History  Chief Complaint  Patient presents with   Fever    [name] [name] is a 87 y.o. male.  He has a history of multiple medical problems including coronary disease stroke and lymphoma.  He just recently had his Port-A-Cath removed as he is in remission.  He is here by ambulance after complaint of shaking chills for the last 5 hours at home.  He denies any illness symptoms including no cough vomiting diarrhea or urinary symptoms.  He did have a fever of 101 at home.  Took some Tylenol.  No sick contacts or recent travel.  He endorses generalized weakness but he says this is all the time.  The history is provided by the patient and the EMS personnel.  Fever Max temp prior to arrival:  101 Duration:  1 day Progression:  Improving Chronicity:  New Relieved by:  Acetaminophen Worsened by:  Nothing Ineffective treatments:  None tried Associated symptoms: chills   Associated symptoms: no chest pain, no confusion, no cough, no diarrhea, no dysuria, no headaches, no myalgias, no nausea, no rhinorrhea, no sore throat and no vomiting   Risk factors: hx of cancer   Risk factors: no recent travel and no sick contacts        Home Medications Prior to Admission medications   Medication Sig Start Date End Date Taking? Authorizing Provider  acetaminophen (TYLENOL) 500 MG tablet Take 500 mg by mouth every 6 (six) hours as needed for mild pain or moderate pain.     [provider]  aspirin EC 81 MG tablet Take 81 mg by mouth daily.    [provider]  atorvastatin (LIPITOR) 20 MG tablet TAKE 1 TABLET(20 MG) BY MOUTH DAILY 06/10/21   Kathyrn Drown, MD  carvedilol (COREG) 6.25 MG tablet TAKE 1 TABLET BY MOUTH 2 TIMES DAILY WITH A MEAL. 09/08/21   Kathyrn Drown, MD  clopidogrel (PLAVIX) 75 MG tablet Take 1 tablet (75 mg total) by mouth daily. 05/12/21    Kathyrn Drown, MD  dapagliflozin propanediol (FARXIGA) 10 MG TABS tablet Take 1 tablet (10 mg total) by mouth daily before breakfast. 08/26/21   [facility], Tami Lin, PA  ferrous sulfate 325 (65 FE) MG tablet Take 325 mg by mouth daily with breakfast.    [provider]  furosemide (LASIX) 20 MG tablet Take 1 tablet (20 mg total) by mouth daily. 07/18/21 07/18/22  Almyra Deforest, PA  hydrALAZINE (APRESOLINE) 25 MG tablet TAKE 1 TABLET BY MOUTH 3 TIMES A DAY DO NOT TAKE IF BLOOD PRESSURE LESS THAN 90/60 Patient taking differently: Take 25 mg by mouth in the morning and at bedtime. 05/12/21   Kathyrn Drown, MD  isosorbide mononitrate (IMDUR) 30 MG 24 hr tablet Take 1 tablet (30 mg total) by mouth daily. 08/19/20   Lavina Hamman, MD  lidocaine-prilocaine (EMLA) cream Apply 1 application topically as needed. Patient not taking: Reported on 08/26/2021 01/08/21   Ladell Pier, MD  methylcellulose (ARTIFICIAL TEARS) 1 % ophthalmic solution Place 1 drop into both eyes daily.    [provider]  nitroGLYCERIN (NITROSTAT) 0.4 MG SL tablet Place 1 tablet (0.4 mg total) under the tongue every 5 (five) minutes as needed for chest pain. Patient not taking: Reported on 08/26/2021 07/05/13   Lyda Jester M, PA-C  ondansetron (ZOFRAN) 8 MG tablet Take  1/2 tablet (4 mg total) by mouth every 8 (eight) hours as needed for nausea or vomiting (As needed starting on Day 4 after Day 1 chemo). Patient not taking: Reported on 08/26/2021 01/08/21   Ladell Pier, MD  pantoprazole (PROTONIX) 20 MG tablet Take 1 tablet (20 mg total) by mouth 2 (two) times daily. 05/12/21   Kathyrn Drown, MD  predniSONE (DELTASONE) 20 MG tablet Take 3 tablets by mouth daily with breakfast. Start on day 2 of each chemo cycle and take for 4 days Patient not taking: Reported on 08/26/2021 04/29/21   Ladell Pier, MD  prochlorperazine (COMPAZINE) 10 MG tablet Take 1 tablet (10 mg total) by mouth every 6 (six) hours as needed  for nausea or vomiting. Patient not taking: Reported on 08/26/2021 01/08/21   Ladell Pier, MD  sacubitril-valsartan (ENTRESTO) 49-51 MG Take 1 tablet by mouth 2 (two) times daily. 07/18/21   Almyra Deforest, PA  tamsulosin (FLOMAX) 0.4 MG CAPS capsule TAKE 1 CAPSULE(0.4 MG) BY MOUTH DAILY 06/11/21   Kathyrn Drown, MD  traMADol (ULTRAM) 50 MG tablet TAKE 1 TABLET BY MOUTH THREE TIMES DAILY AS NEEDED FOR MODERATE PAIN 06/08/21   Kathyrn Drown, MD      Allergies    Brilinta [ticagrelor], Influenza vaccines, and Fluoride preparations    Review of Systems   Review of Systems  Constitutional:  Positive for chills and fever.  HENT:  Negative for rhinorrhea and sore throat.   Eyes:  Negative for visual disturbance.  Respiratory:  Negative for cough.   Cardiovascular:  Negative for chest pain.  Gastrointestinal:  Negative for diarrhea, nausea and vomiting.  Genitourinary:  Negative for dysuria.  Musculoskeletal:  Negative for myalgias.  Neurological:  Negative for headaches.  Psychiatric/Behavioral:  Negative for confusion.     Physical Exam Updated Vital Signs BP (!) 147/113 (BP Location: Left Arm)   Pulse 96   Temp 99.6 F (37.6 C) (Oral)   Resp 20   SpO2 94%  Physical Exam Vitals and nursing note reviewed.  Constitutional:      General: He is not in acute distress.    Appearance: Normal appearance. He is well-developed.  HENT:     Head: Normocephalic and atraumatic.  Eyes:     Conjunctiva/sclera: Conjunctivae normal.  Cardiovascular:     Rate and Rhythm: Normal rate and regular rhythm.     Heart sounds: No murmur heard. Pulmonary:     Effort: Pulmonary effort is normal. No respiratory distress.     Breath sounds: Normal breath sounds.  Abdominal:     Palpations: Abdomen is soft.     Tenderness: There is no abdominal tenderness. There is no guarding or rebound.  Musculoskeletal:     Cervical back: Neck supple.     Right lower leg: No edema.     Left lower leg: No edema.   Skin:    General: Skin is warm and dry.     Capillary Refill: Capillary refill takes less than 2 seconds.  Neurological:     General: No focal deficit present.     Mental Status: He is alert.     ED Results / Procedures / Treatments   Labs (all labs ordered are listed, but only abnormal results are displayed) Labs Reviewed  COMPREHENSIVE METABOLIC PANEL - Abnormal; Notable for the following components:      Result Value   CO2 19 (*)    Glucose, Bld 146 (*)    BUN 40 (*)  Creatinine, Ser 1.76 (*)    Calcium 8.6 (*)    Total Protein 6.3 (*)    GFR, Estimated 38 (*)    All other components within normal limits  CBC WITH DIFFERENTIAL/PLATELET - Abnormal; Notable for the following components:   MCV 78.0 (*)    MCH 24.4 (*)    RDW 22.3 (*)    Platelets 87 (*)    Lymphs Abs 0.2 (*)    All other components within normal limits  URINALYSIS, ROUTINE W REFLEX MICROSCOPIC - Abnormal; Notable for the following components:   APPearance CLOUDY (*)    Hgb urine dipstick SMALL (*)    Protein, ur 30 (*)    Leukocytes,Ua MODERATE (*)    Bacteria, UA MANY (*)    All other components within normal limits  BRAIN NATRIURETIC PEPTIDE - Abnormal; Notable for the following components:   B Natriuretic Peptide 2,061.0 (*)    All other components within normal limits  CBC WITH DIFFERENTIAL/PLATELET - Abnormal; Notable for the following components:   Hemoglobin 12.1 (*)    MCV 76.3 (*)    MCH 23.5 (*)    RDW 21.9 (*)    Platelets 73 (*)    Neutro Abs 8.9 (*)    Lymphs Abs 0.2 (*)    Abs Immature Granulocytes 0.09 (*)    All other components within normal limits  COMPREHENSIVE METABOLIC PANEL - Abnormal; Notable for the following components:   CO2 20 (*)    Glucose, Bld 117 (*)    BUN 36 (*)    Creatinine, Ser 1.58 (*)    Calcium 8.7 (*)    Total Protein 6.0 (*)    Albumin 3.4 (*)    GFR, Estimated 43 (*)    All other components within normal limits  CBG MONITORING, ED - Abnormal;  Notable for the following components:   Glucose-Capillary 145 (*)    All other components within normal limits  SARS CORONAVIRUS 2 BY RT PCR  CULTURE, BLOOD (ROUTINE X 2)  CULTURE, BLOOD (ROUTINE X 2)  URINE CULTURE  LACTIC ACID, PLASMA  PROTIME-INR  APTT  MAGNESIUM  TSH  MAGNESIUM    EKG EKG Interpretation  Date/Time:  Thursday September 25 2021 16:44:01 EDT Ventricular Rate:  101 PR Interval:  202 QRS Duration: 118 QT Interval:  348 QTC Calculation: 452 R Axis:   -52 Text Interpretation: Sinus tachycardia Probable left atrial enlargement LAD, consider left anterior fascicular block LVH with secondary repolarization abnormality Anterior infarct, old increased rate and ischemic changes from prior 6/22 Confirmed by Aletta Edouard [phone]) on 09/25/2021 4:45:07 PM  Radiology CT CHEST WO CONTRAST  Result Date: 09/25/2021 CLINICAL DATA:  Pneumonia.  History of B-cell lymphoma. EXAM: CT CHEST WITHOUT CONTRAST TECHNIQUE: Multidetector CT imaging of the chest was performed following the standard protocol without IV contrast. RADIATION DOSE REDUCTION: This exam was performed according to the departmental dose-optimization program which includes automated exposure control, adjustment of the mA and/or kV according to patient size and/or use of iterative reconstruction technique. COMPARISON:  PET-CT 04/02/2021 FINDINGS: Cardiovascular: Heart is moderately enlarged, unchanged. There is no pericardial effusion. Thoracic aorta is normal in size. There are severe atherosclerotic calcifications of the aorta and coronary arteries. Abdominal aortic aneurysm measuring up to 4.7 cm is incompletely imaged and unchanged. Note is made that this is incompletely evaluated on this study. Mediastinum/Nodes: There is a moderate-sized hiatal hernia similar to the prior study. Visualized thyroid gland and esophagus are within normal limits. There are  no enlarged mediastinal or hilar lymph nodes. Lungs/Pleura: There are  bilateral calcified pleural plaques similar to the prior study. There is minimal atelectasis in the lung bases. There is no pleural effusion or pneumothorax. Upper Abdomen: No acute findings. Musculoskeletal: No acute fractures are seen. Degenerative changes affect the shoulders. IMPRESSION: 1. No acute cardiopulmonary process identified. 2. Stable cardiomegaly. 3. Stable calcified pleural plaques. 4. Stable moderate-sized hiatal hernia. 5. 4.7 cm abdominal aortic aneurysm. Recommend follow-up every 6 months and vascular consultation. Reference: J Am Coll Radiol 0932;35:573-220. Aortic Atherosclerosis (ICD10-I70.0). Electronically Signed   By: Ronney Asters M.D.   On: 09/25/2021 20:48   DG Chest Port 1 View  Result Date: 09/25/2021 CLINICAL DATA:  Questionable sepsis - evaluate for abnormality EXAM: PORTABLE CHEST 1 VIEW COMPARISON:  Radiograph 05/27/2021 FINDINGS: Enlarged cardiac silhouette with mild pulmonary vascular congestion. Right chest port has been removed. Faint lower lung airspace opacities bilaterally. Unchanged left-sided calcified pleural plaque. Bibasilar subsegmental atelectasis. No pleural effusion. No pneumothorax. 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 No acute osseous abnormality. Thoracic spondylosis. IMPRESSION: Enlarged cardiac silhouette with central pulmonary vascular congestion. Faint lower lung opacities which could be atelectasis or developing infection. Electronically Signed   By: Maurine Simmering M.D.   On: 09/25/2021 17:07    Procedures .Critical Care  Performed by: Hayden Rasmussen, MD Authorized by: Hayden Rasmussen, MD   Critical care provider statement:    Critical care time (minutes):  45   Critical care time was exclusive of:  Separately billable procedures and treating other patients   Critical care was necessary to treat or prevent imminent or life-threatening deterioration of the following conditions:  Sepsis   Critical care was time spent  personally by me on the following activities:  Development of treatment plan with patient or surrogate, discussions with consultants, evaluation of patient's response to treatment, examination of patient, obtaining history from patient or surrogate, ordering and performing treatments and interventions, ordering and review of laboratory studies, ordering and review of radiographic studies, pulse oximetry, re-evaluation of patient's condition and review of old charts   I assumed direction of critical care for this patient from another provider in my specialty: no       Medications Ordered in ED Medications  acetaminophen (TYLENOL) tablet 650 mg (650 mg Oral Given 09/26/21 0729)    Or  acetaminophen (TYLENOL) suppository 650 mg ( Rectal See Alternative 09/26/21 0729)  cefTRIAXone (ROCEPHIN) 1 g in sodium chloride 0.9 % 100 mL IVPB (has no administration in time range)  aspirin EC tablet 81 mg (81 mg Oral Given 09/26/21 0919)  atorvastatin (LIPITOR) tablet 20 mg (20 mg Oral Given 09/26/21 0919)  carvedilol (COREG) tablet 6.25 mg (6.25 mg Oral Given 09/26/21 0729)  clopidogrel (PLAVIX) tablet 75 mg (75 mg Oral Given 09/26/21 0919)  ferrous sulfate tablet 325 mg (325 mg Oral Given 09/26/21 0729)  furosemide (LASIX) tablet 20 mg (20 mg Oral Given 09/26/21 0923)  pantoprazole (PROTONIX) EC tablet 20 mg (20 mg Oral Given 09/26/21 0919)  sacubitril-valsartan (ENTRESTO) 49-51 mg per tablet (1 tablet Oral Given 09/26/21 0919)  lactated ringers bolus 500 mL (0 mLs Intravenous Stopped 09/25/21 1959)  furosemide (LASIX) injection 20 mg (20 mg Intravenous Given 09/25/21 2212)  cefTRIAXone (ROCEPHIN) 1 g in sodium chloride 0.9 % 100 mL IVPB (0 g Intravenous Stopped 09/25/21 2245)    ED Course/ Medical Decision Making/ A&P Clinical Course as of 09/26/21 1005  Thu Sep 25, 2021  1714 Chest x-ray  interpreted by me as possible right lower lobe infiltrate awaiting radiology reading. [MB]  2109 Nurses telling me that every  time the patient moves around he drops his sats and increases his heart rate.  CT Noncon chest does not show any acute infiltrate. [MB]  2203 Discussed with Dr. Velia Meyer [facility] hospitalist who will evaluate the patient for admission.  Biotic started for possible UTI. [MB]    Clinical Course User Index [MB] Hayden Rasmussen, MD                           Medical Decision Making Amount and/or Complexity of Data Reviewed Labs: ordered. Radiology: ordered.  Risk Prescription drug management. Decision regarding hospitalization.  This patient complains of rigors fever weakness; this involves an extensive number of treatment Options and is a complaint that carries with it a high risk of complications and morbidity. The differential includes sepsis, Sirs, UTI, COVID, flu  I ordered, reviewed and interpreted labs, which included CBC with normal white count, hemoglobin slightly higher than priors possibly reflecting some dehydration, chemistries with low bicarb elevated creatinine, lactate normal, urinalysis concerning for infection sent for culture, blood culture sent I ordered medication IV fluids and antibiotics, IV Lasix and reviewed PMP when indicated. I ordered imaging studies which included chest x-ray, CT noncontrast and I independently    visualized and interpreted imaging which showed no acute infiltrates Additional history obtained from EMS Previous records obtained and reviewed in epic no recent admissions, did see oncology last week I consulted Dr. Velia Meyer [facility] hospitalist and discussed lab and imaging findings and discussed disposition.  Cardiac monitoring reviewed, initially sinus tachycardia down to normal sinus rhythm Social determinants considered, patient not very physically active Critical Interventions: Work-up and management of patient's rigors possible bacteremia/sepsis with IV antibiotics  After the interventions stated above, I reevaluated the patient and found patient  be still rather weak appearing although hemodynamically stable. Admission and further testing considered, he would benefit from admission for further management.  Patient in agreement with plan.          Final Clinical Impression(s) / ED Diagnoses Final diagnoses:  Rigors  Lower urinary tract infectious disease  SIRS (systemic inflammatory response syndrome) (HCC)  General weakness    Rx / DC Orders ED Discharge Orders     None         Hayden Rasmussen, MD 09/26/21 1011

## 2021-09-26 ENCOUNTER — Encounter (HOSPITAL_COMMUNITY): Payer: Self-pay | Admitting: Internal Medicine

## 2021-09-26 DIAGNOSIS — N3000 Acute cystitis without hematuria: Secondary | ICD-10-CM | POA: Diagnosis present

## 2021-09-26 DIAGNOSIS — K219 Gastro-esophageal reflux disease without esophagitis: Secondary | ICD-10-CM | POA: Diagnosis present

## 2021-09-26 DIAGNOSIS — A419 Sepsis, unspecified organism: Secondary | ICD-10-CM | POA: Diagnosis not present

## 2021-09-26 DIAGNOSIS — R531 Weakness: Secondary | ICD-10-CM | POA: Diagnosis not present

## 2021-09-26 DIAGNOSIS — I5042 Chronic combined systolic (congestive) and diastolic (congestive) heart failure: Secondary | ICD-10-CM | POA: Diagnosis not present

## 2021-09-26 DIAGNOSIS — I7143 Infrarenal abdominal aortic aneurysm, without rupture: Secondary | ICD-10-CM | POA: Diagnosis not present

## 2021-09-26 DIAGNOSIS — N390 Urinary tract infection, site not specified: Secondary | ICD-10-CM | POA: Diagnosis present

## 2021-09-26 LAB — BLOOD CULTURE ID PANEL (REFLEXED) - BCID2

## 2021-09-26 LAB — COMPREHENSIVE METABOLIC PANEL
ALT: 11 U/L (ref 0–44)
AST: 21 U/L (ref 15–41)
Albumin: 3.4 g/dL — ABNORMAL LOW (ref 3.5–5.0)
Alkaline Phosphatase: 56 U/L (ref 38–126)
Anion gap: 9 (ref 5–15)
BUN: 36 mg/dL — ABNORMAL HIGH (ref 8–23)
CO2: 20 mmol/L — ABNORMAL LOW (ref 22–32)
Calcium: 8.7 mg/dL — ABNORMAL LOW (ref 8.9–10.3)
Chloride: 110 mmol/L (ref 98–111)
Creatinine, Ser: 1.58 mg/dL — ABNORMAL HIGH (ref 0.61–1.24)
GFR, Estimated: 43 mL/min — ABNORMAL LOW (ref >60)
Glucose, Bld: 117 mg/dL — ABNORMAL HIGH (ref 70–99)
Potassium: 4.2 mmol/L (ref 3.5–5.1)
Sodium: 139 mmol/L (ref 135–145)
Total Bilirubin: 0.7 mg/dL (ref 0.3–1.2)
Total Protein: 6 g/dL — ABNORMAL LOW (ref 6.5–8.1)

## 2021-09-26 LAB — CBC WITH DIFFERENTIAL/PLATELET
Abs Immature Granulocytes: 0.09 10*3/uL — ABNORMAL HIGH (ref 0.00–0.07)
Basophils Absolute: 0 10*3/uL (ref 0.0–0.1)
Basophils Relative: 0 %
Eosinophils Absolute: 0 10*3/uL (ref 0.0–0.5)
Eosinophils Relative: 0 %
HCT: 39.2 % (ref 39.0–52.0)
Hemoglobin: 12.1 g/dL — ABNORMAL LOW (ref 13.0–17.0)
Immature Granulocytes: 1 %
Lymphocytes Relative: 2 %
Lymphs Abs: 0.2 10*3/uL — ABNORMAL LOW (ref 0.7–4.0)
MCH: 23.5 pg — ABNORMAL LOW (ref 26.0–34.0)
MCHC: 30.9 g/dL (ref 30.0–36.0)
MCV: 76.3 fL — ABNORMAL LOW (ref 80.0–100.0)
Monocytes Absolute: 0.7 10*3/uL (ref 0.1–1.0)
Monocytes Relative: 7 %
Neutro Abs: 8.9 10*3/uL — ABNORMAL HIGH (ref 1.7–7.7)
Neutrophils Relative %: 90 %
Platelets: 73 10*3/uL — ABNORMAL LOW (ref 150–400)
RBC: 5.14 MIL/uL (ref 4.22–5.81)
RDW: 21.9 % — ABNORMAL HIGH (ref 11.5–15.5)
Smear Review: NORMAL
WBC: 9.9 10*3/uL (ref 4.0–10.5)
nRBC: 0 % (ref 0.0–0.2)

## 2021-09-26 LAB — TSH: TSH: 0.911 u[IU]/mL (ref 0.350–4.500)

## 2021-09-26 LAB — CBG MONITORING, ED: Glucose-Capillary: 145 mg/dL — ABNORMAL HIGH (ref 70–99)

## 2021-09-26 LAB — MAGNESIUM: Magnesium: 1.8 mg/dL (ref 1.7–2.4)

## 2021-09-26 MED ORDER — SODIUM CHLORIDE 0.9 % IV SOLN
2.0000 g | INTRAVENOUS | Status: DC
  Administered 2021-09-26 – 2021-09-28 (×3): 2 g via INTRAVENOUS
  Filled 2021-09-26 (×3): qty 20

## 2021-09-26 MED ORDER — FUROSEMIDE 20 MG PO TABS
20.0000 mg | ORAL_TABLET | Freq: Every day | ORAL | Status: DC
  Administered 2021-09-26: 20 mg via ORAL
  Filled 2021-09-26 (×2): qty 1

## 2021-09-26 MED ORDER — SACUBITRIL-VALSARTAN 49-51 MG PO TABS
1.0000 | ORAL_TABLET | Freq: Two times a day (BID) | ORAL | Status: DC
  Administered 2021-09-26 (×2): 1 via ORAL
  Filled 2021-09-26 (×4): qty 1

## 2021-09-26 MED ORDER — CARVEDILOL 6.25 MG PO TABS
6.2500 mg | ORAL_TABLET | Freq: Two times a day (BID) | ORAL | Status: DC
  Administered 2021-09-26 (×2): 6.25 mg via ORAL
  Filled 2021-09-26 (×2): qty 2

## 2021-09-26 MED ORDER — ASPIRIN 81 MG PO TBEC
81.0000 mg | DELAYED_RELEASE_TABLET | Freq: Every day | ORAL | Status: DC
  Administered 2021-09-26 – 2021-09-27 (×2): 81 mg via ORAL
  Filled 2021-09-26 (×3): qty 1

## 2021-09-26 MED ORDER — ATORVASTATIN CALCIUM 10 MG PO TABS
20.0000 mg | ORAL_TABLET | Freq: Every day | ORAL | Status: DC
  Administered 2021-09-26 – 2021-09-29 (×4): 20 mg via ORAL
  Filled 2021-09-26 (×4): qty 2

## 2021-09-26 MED ORDER — HYDRALAZINE HCL 25 MG PO TABS
25.0000 mg | ORAL_TABLET | Freq: Three times a day (TID) | ORAL | Status: DC
  Administered 2021-09-26 – 2021-09-27 (×3): 25 mg via ORAL
  Filled 2021-09-26 (×3): qty 1

## 2021-09-26 MED ORDER — ENOXAPARIN SODIUM 40 MG/0.4ML IJ SOSY
40.0000 mg | PREFILLED_SYRINGE | Freq: Every day | INTRAMUSCULAR | Status: DC
  Administered 2021-09-26 – 2021-09-27 (×2): 40 mg via SUBCUTANEOUS
  Filled 2021-09-26 (×2): qty 0.4

## 2021-09-26 MED ORDER — ISOSORBIDE MONONITRATE ER 30 MG PO TB24
30.0000 mg | ORAL_TABLET | Freq: Every day | ORAL | Status: DC
  Administered 2021-09-26 – 2021-09-29 (×4): 30 mg via ORAL
  Filled 2021-09-26 (×4): qty 1

## 2021-09-26 MED ORDER — FERROUS SULFATE 325 (65 FE) MG PO TABS
325.0000 mg | ORAL_TABLET | Freq: Every day | ORAL | Status: DC
  Administered 2021-09-26 – 2021-09-29 (×4): 325 mg via ORAL
  Filled 2021-09-26 (×4): qty 1

## 2021-09-26 MED ORDER — PANTOPRAZOLE SODIUM 20 MG PO TBEC
20.0000 mg | DELAYED_RELEASE_TABLET | Freq: Two times a day (BID) | ORAL | Status: DC
  Administered 2021-09-26 – 2021-09-29 (×7): 20 mg via ORAL
  Filled 2021-09-26 (×7): qty 1

## 2021-09-26 MED ORDER — TAMSULOSIN HCL 0.4 MG PO CAPS
0.4000 mg | ORAL_CAPSULE | Freq: Every day | ORAL | Status: DC
  Administered 2021-09-26 – 2021-09-29 (×4): 0.4 mg via ORAL
  Filled 2021-09-26 (×4): qty 1

## 2021-09-26 MED ORDER — CLOPIDOGREL BISULFATE 75 MG PO TABS
75.0000 mg | ORAL_TABLET | Freq: Every day | ORAL | Status: DC
  Administered 2021-09-26 – 2021-09-29 (×4): 75 mg via ORAL
  Filled 2021-09-26 (×4): qty 1

## 2021-09-26 NOTE — Assessment & Plan Note (Signed)
#)   Hyperlipidemia: documented h/o such. On atorvastatin as outpatient.  ?  ?  ?Plan: continue home statin.  ?  ?

## 2021-09-26 NOTE — Assessment & Plan Note (Signed)
  #)   Chronic abdominal aortic aneurysm: Today CT chest showed evidence of 4.7 cm abdominal aortic aneurysm, with now evidence for associated dissection, with size of this aneurysm appearing unchanged relative to results of PET scan from January 2023.   Plan: Per associated radiology recommendations, recommend every 6 month imaging follow-up.

## 2021-09-26 NOTE — ED Notes (Signed)
PT at bedside.

## 2021-09-26 NOTE — Assessment & Plan Note (Signed)
 #)   Chronic systolic/diastolic heart failure as well as chronic biventricular failure: Documented history of such, with most recent echocardiogram in April 2023 notable for LVEF 25 to 09%, grade 3 diastolic dysfunction, moderately reduced right ventricular systolic function.  Given the presence of chronic right-sided heart failure as well as associated echocardiogram also showing evidence of pericardial effusion, there may be an element of preload dependence associated with the patient's physiology.  Consequently, this presentation is associated with a delicate fluid balance.  He does not appear to be overtly volume overloaded at this time while noting that he received a small fluid bolus in dose of IV Lasix in the ED.  We will refrain from additional IV fluids and IV diuresis at this time, closely monitoring ensuing volume status given the aforementioned delicate fluid balance,    Plan: Monitor strict I's and O's and daily weights. resume home Lasix in the morning.  Continue home Coreg and Entresto.  In the context of presenting sepsis due to UTI, will hold home Imdur and hydralazine for now, with close monitoring of ensuing blood pressure to assist with guiding of timing of resumption of these hypertensive medications.  In the setting of presenting urinary tract infection, will hold home dapagliflozin for now.
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
## 2021-09-26 NOTE — ED Notes (Signed)
Pt is NSR w/BBB on monitor 

## 2021-09-26 NOTE — Evaluation (Signed)
Physical Therapy Evaluation Patient Details Name: [name] [name] MRN: [id number] DOB: [birth year] Today's Date: 09/26/2021  History of Present Illness  [name] [name] is a 87 y.o. male with medical history significant for history of large cell lymphoma, reportedly now in remission, chronic systolic/diastolic heart failure, chronic right-sided heart failure, hypertension, hyperlipidemia, who is admitted to [facility] on 09/25/2021 with sepsis due to acute cystitis after presenting from home to [facility] ED complaining of fever.   Clinical Impression  Patient received on stretcher in ED sleeping. Required multiple attempts to rouse him adequately to participate. Patient is pleasant and requires min A with bed mobility, min A for transfers and ambulation in room with SPC. He will continue to benefit from skilled PT while here to improve functional independence, strength and safety.           Recommendations for follow up therapy are one component of a multi-disciplinary discharge planning process, led by the attending physician.  Recommendations may be updated based on patient status, additional functional criteria and insurance authorization.  Follow Up Recommendations Home health PT      Assistance Recommended at Discharge Intermittent Supervision/Assistance  Patient can return home with the following  Assist for transportation;Help with stairs or ramp for entrance;A little help with walking and/or transfers;A little help with bathing/dressing/bathroom    Equipment Recommendations None recommended by PT  Recommendations for Other Services       Functional Status Assessment Patient has had a recent decline in their functional status and demonstrates the ability to make significant improvements in function in a reasonable and predictable amount of time.     Precautions / Restrictions Precautions Precautions: Fall Restrictions Weight Bearing Restrictions: No      Mobility  Bed  Mobility Overal bed mobility: Needs Assistance Bed Mobility: Supine to Sit, Sit to Supine     Supine to sit: Min assist Sit to supine: Modified independent (Device/Increase time)   General bed mobility comments: assist needed to raise trun k to seated position on stretcher.    Transfers Overall transfer level: Needs assistance Equipment used: Straight cane Transfers: Sit to/from Stand Sit to Stand: Min guard                Ambulation/Gait Ambulation/Gait assistance: Herbalist (Feet): 10 Feet Assistive device: Straight cane Gait Pattern/deviations: Step-through pattern Gait velocity: decr     General Gait Details: patient able to ambulate short distance in room with min A and SPC  Stairs            Wheelchair Mobility    Modified Rankin (Stroke Patients Only)       Balance Overall balance assessment: Needs assistance Sitting-balance support: Feet supported Sitting balance-Leahy Scale: Fair     Standing balance support: Bilateral upper extremity supported, During functional activity, Reliant on assistive device for balance Standing balance-Leahy Scale: Fair Standing balance comment: patient using cane, but reaching for counter in room to assist with stabilization                             Pertinent Vitals/Pain Pain Assessment Pain Assessment: No/denies pain    Home Living Family/patient expects to be discharged to:: Private residence Living Arrangements: Spouse/significant other Available Help at Discharge: Family;Available 24 hours/day Type of Home: House Home Access: Stairs to enter Entrance Stairs-Rails: Psychiatric nurse of Steps: 3   Home Layout: One level Home Equipment: Conservation officer, nature (2 wheels);Cane -  single point      Prior Function Prior Level of Function : Independent/Modified Independent;Driving             Mobility Comments: uses SPC at baseline, drives, shops ADLs Comments:  independent     Hand Dominance   Dominant Hand: Right    Extremity/Trunk Assessment   Upper Extremity Assessment Upper Extremity Assessment: Overall WFL for tasks assessed    Lower Extremity Assessment Lower Extremity Assessment: Overall WFL for tasks assessed    Cervical / Trunk Assessment Cervical / Trunk Assessment: Normal  Communication   Communication: HOH  Cognition Arousal/Alertness: Lethargic Behavior During Therapy: WFL for tasks assessed/performed Overall Cognitive Status: Within Functional Limits for tasks assessed                                 General Comments: sleeping on arrival, took a while to get him to wake to participate.        General Comments      Exercises     Assessment/Plan    PT Assessment Patient needs continued PT services  PT Problem List Decreased strength;Decreased mobility;Decreased activity tolerance;Decreased balance;Decreased safety awareness       PT Treatment Interventions DME instruction;Therapeutic exercise;Gait training;Balance training;Stair training;Functional mobility training;Therapeutic activities;Patient/family education    PT Goals (Current goals can be found in the Care Plan section)  Acute Rehab PT Goals Patient Stated Goal: to return home PT Goal Formulation: With patient Time For Goal Achievement: 10/10/21 Potential to Achieve Goals: Good    Frequency Min 3X/week     Co-evaluation               AM-PAC PT "6 Clicks" Mobility  Outcome Measure Help needed turning from your back to your side while in a flat bed without using bedrails?: A Little Help needed moving from lying on your back to sitting on the side of a flat bed without using bedrails?: A Little Help needed moving to and from a bed to a chair (including a wheelchair)?: A Little Help needed standing up from a chair using your arms (e.g., wheelchair or bedside chair)?: A Little Help needed to walk in hospital room?: A  Little Help needed climbing 3-5 steps with a railing? : A Little 6 Click Score: 18    End of Session Equipment Utilized During Treatment: Gait belt Activity Tolerance: Patient tolerated treatment well Patient left: in bed;with call bell/phone within reach Nurse Communication: Mobility status PT Visit Diagnosis: Unsteadiness on feet (R26.81);Muscle weakness (generalized) (M62.81);Difficulty in walking, not elsewhere classified (R26.2)    Time: 4696-2952 PT Time Calculation (min) (ACUTE ONLY): 12 min   Charges:   PT Evaluation $PT Eval Low Complexity: 1 Low         Anquan Azzarello, PT, GCS 09/26/21,10:53 AM

## 2021-09-26 NOTE — Assessment & Plan Note (Signed)
 #)   Generalized weakness:  1-2 day duration of generalized weakness, in the absence of any evidence of acute focal neurologic deficits, including no evidence of acute focal weakness.  Suspect contribution from physiologic stress stemming from presenting sepsis due to urinary tract infection, as above.    In terms of other considered etiologies, acute ischemic CVA is felt to be less likely at this time in absence of any associated acute focal neuro deficits. Will further eval for any additional contributions from endocrine/metabolic sources, as detailed below.    Plan: work-up and management of presenting sepsis due to UTI, as described above. PT/OT consults ordered for the AM. Fall precautions. CMP/CBC in the AM. Check TSH, serum magnesium level.

## 2021-09-26 NOTE — Progress Notes (Addendum)
PROGRESS NOTE        PATIENT DETAILS Name: [name] [name] Age: 87 y.o. Sex: male Date of Birth: 10-03-1934 Admit Date: 09/25/2021 Admitting Physician Evalee Mutton Kristeen Mans, MD SNK:[id number], Elayne Snare, MD  Brief Summary: Patient is a 87 y.o.  male with history of large cell lymphoma (in remission), chronic systolic/diastolic heart failure, HTN, HLD-who presented with fever/chills-and was found to have sepsis in the setting of complicated UTI.  See below for further details.    Significant events: 6/29>> admit to [facility] for sepsis due to complicated UTI.  Significant studies: 4/23>> Echo: EF 34-19%, RV systolic function moderately reduced, grade 3 diastolic dysfunction. 6/29>> CXR: No obvious PNA. 6/29>> CT chest: No PNA-4.7 cm abdominal aortic aneurysm.  Significant microbiology data: 6/29>> blood culture: Pending 6/29>> urine culture: Pending  Procedures: None  Consults: None  Subjective: Lying comfortably in bed-denies any chest pain or shortness of breath.  Feels better than on initial presentation.  No further chills.  Objective: Vitals: Blood pressure (!) 180/86, pulse 70, temperature 98.6 F (37 C), temperature source Oral, resp. rate 19, SpO2 96 %.   Exam: Gen Exam:Alert awake-not in any distress HEENT:atraumatic, normocephalic Chest: B/L clear to auscultation anteriorly CVS:S1S2 regular Abdomen:soft non tender, non distended Extremities:no edema Neurology: Non focal Skin: no rash  Pertinent Labs/Radiology:    Latest Ref Rng & Units 09/26/2021    4:32 AM 09/25/2021    4:57 PM 09/18/2021    9:24 AM  CBC  WBC 4.0 - 10.5 K/uL 9.9  6.8  7.8   Hemoglobin 13.0 - 17.0 g/dL 12.1  13.5  11.8   Hematocrit 39.0 - 52.0 % 39.2  43.2  39.1   Platelets 150 - 400 K/uL 73  87  109     Lab Results  Component Value Date   NA 139 09/26/2021   K 4.2 09/26/2021   CL 110 09/26/2021   CO2 20 (L) 09/26/2021      Assessment/Plan: Sepsis due to  complicated UTI (POA): Sepsis physiology has resolved-feels better-no further fever overnight-continue IV Rocephin-awaiting culture data.  Chronic combined systolic/diastolic heart failure: Volume status stable-continue Entresto/Coreg/furosemide.  History of CAD-s/p PCI in 2015: Currently no anginal symptoms-continue aspirin/Plavix/beta-blocker/statin  History of PAD-s/p PCI to iliac artery in 2015-s/p right carotid endarterectomy 2017-known occluded left carotid artery: Continue antiplatelet/statin  HTN: BP on the higher side-continue Coreg/Entresto-resume hydralazine/nitrates-follow and adjust.  HLD: Continue statin  GERD: Continue PPI  History of large B-cell lymphoma: Followed by oncology-completed 6 cycles of chemo January 2023.  Currently in remission.    Thrombocytopenia: Mild-appears to have developed since March-unclear etiology-watch closely-if it decreases further-May need to stop prophylactic Lovenox.  BPH: Resume Flomax  4.7 cm abdominal aortic aneurysm: Repeat imaging every 6 months.  Debility/deconditioning: Await PT/OT eval. BMI Estimated body mass index is 25.24 kg/m as calculated from the following:   Height as of 09/18/21: '5\' 8"'$  (1.727 m).   Weight as of 09/18/21: 75.3 kg.   Code status:   Code Status: Full Code   DVT Prophylaxis: enoxaparin (LOVENOX) injection 40 mg Start: 09/26/21 1200 SCDs Start: 09/25/21 2208   Family Communication: Daughter-Janice-[phone] updated over the phone on 6/30   Disposition Plan: Status is: Inpatient Remains inpatient appropriate because:    Planned Discharge Destination:Home health   Diet: Diet Order  Diet regular Room service appropriate? Yes; Fluid consistency: Thin  Diet effective now                     Antimicrobial agents: Anti-infectives (From admission, onward)    Start     Dose/Rate Route Frequency Ordered Stop   09/26/21 1600  cefTRIAXone (ROCEPHIN) 1 g in sodium chloride 0.9 %  100 mL IVPB        1 g 200 mL/hr over 30 Minutes Intravenous Every 24 hours 09/25/21 2208     09/25/21 2200  cefTRIAXone (ROCEPHIN) 1 g in sodium chloride 0.9 % 100 mL IVPB        1 g 200 mL/hr over 30 Minutes Intravenous  Once 09/25/21 2153 09/25/21 2245        MEDICATIONS: Scheduled Meds:  aspirin EC  81 mg Oral Daily   atorvastatin  20 mg Oral Daily   carvedilol  6.25 mg Oral BID WC   clopidogrel  75 mg Oral Daily   enoxaparin (LOVENOX) injection  40 mg Subcutaneous Q24H   ferrous sulfate  325 mg Oral Q breakfast   furosemide  20 mg Oral Daily   pantoprazole  20 mg Oral BID   sacubitril-valsartan  1 tablet Oral BID   Continuous Infusions:  cefTRIAXone (ROCEPHIN)  IV     PRN Meds:.acetaminophen **OR** acetaminophen   I have personally reviewed following labs and imaging studies  LABORATORY DATA: CBC: Recent Labs  Lab 09/25/21 1657 09/26/21 0432  WBC 6.8 9.9  NEUTROABS 6.4 8.9*  HGB 13.5 12.1*  HCT 43.2 39.2  MCV 78.0* 76.3*  PLT 87* 73*    Basic Metabolic Panel: Recent Labs  Lab 09/25/21 1657 09/26/21 0432  NA 138 139  K 4.7 4.2  CL 108 110  CO2 19* 20*  GLUCOSE 146* 117*  BUN 40* 36*  CREATININE 1.76* 1.58*  CALCIUM 8.6* 8.7*  MG  --  1.8    GFR: Estimated Creatinine Clearance: 33.7 mL/min (A) (by C-G formula based on SCr of 1.58 mg/dL (H)).  Liver Function Tests: Recent Labs  Lab 09/25/21 1657 09/26/21 0432  AST 17 21  ALT 10 11  ALKPHOS 71 56  BILITOT 0.3 0.7  PROT 6.3* 6.0*  ALBUMIN 3.7 3.4*   No results for input(s): "LIPASE", "AMYLASE" in the last 168 hours. No results for input(s): "AMMONIA" in the last 168 hours.  Coagulation Profile: Recent Labs  Lab 09/25/21 1657  INR 1.2    Cardiac Enzymes: No results for input(s): "CKTOTAL", "CKMB", "CKMBINDEX", "TROPONINI" in the last 168 hours.  BNP (last 3 results) No results for input(s): "PROBNP" in the last 8760 hours.  Lipid Profile: No results for input(s): "CHOL",  "HDL", "LDLCALC", "TRIG", "CHOLHDL", "LDLDIRECT" in the last 72 hours.  Thyroid Function Tests: Recent Labs    09/26/21 0432  TSH 0.911    Anemia Panel: No results for input(s): "VITAMINB12", "FOLATE", "FERRITIN", "TIBC", "IRON", "RETICCTPCT" in the last 72 hours.  Urine analysis:    Component Value Date/Time   COLORURINE YELLOW 09/25/2021 2125   APPEARANCEUR CLOUDY (A) 09/25/2021 2125   LABSPEC 1.016 09/25/2021 2125   PHURINE 5.0 09/25/2021 2125   GLUCOSEU NEGATIVE 09/25/2021 2125   HGBUR SMALL (A) 09/25/2021 2125   BILIRUBINUR NEGATIVE 09/25/2021 2125   [place] NEGATIVE 09/25/2021 2125   PROTEINUR 30 (A) 09/25/2021 2125   NITRITE NEGATIVE 09/25/2021 2125   LEUKOCYTESUR MODERATE (A) 09/25/2021 2125    Sepsis Labs: Lactic Acid, Venous  Component Value Date/Time   LATICACIDVEN 1.5 09/25/2021 1659    MICROBIOLOGY: Recent Results (from the past 240 hour(s))  SARS Coronavirus 2 by RT PCR (hospital order, performed in [facility] hospital lab) *cepheid single result test* Anterior Nasal Swab     Status: None   Collection Time: 09/25/21  4:53 PM   Specimen: Anterior Nasal Swab  Result Value Ref Range Status   SARS Coronavirus 2 by RT PCR NEGATIVE NEGATIVE Final    Comment: (NOTE) SARS-CoV-2 target nucleic acids are NOT DETECTED.  The SARS-CoV-2 RNA is generally detectable in upper and lower respiratory specimens during the acute phase of infection. The lowest concentration of SARS-CoV-2 viral copies this assay can detect is 250 copies / mL. A negative result does not preclude SARS-CoV-2 infection and should not be used as the sole basis for treatment or other patient management decisions.  A negative result may occur with improper specimen collection / handling, submission of specimen other than nasopharyngeal swab, presence of viral mutation(s) within the areas targeted by this assay, and inadequate number of viral copies (<250 copies / mL). A negative result must  be combined with clinical observations, patient history, and epidemiological information.  Fact Sheet for Patients:   https://www.patel.info/  Fact Sheet for Healthcare Providers: https://hall.com/  This test is not yet approved or  cleared by the [place] FDA and has been authorized for detection and/or diagnosis of SARS-CoV-2 by FDA under an Emergency Use Authorization (EUA).  This EUA will remain in effect (meaning this test can be used) for the duration of the COVID-19 declaration under Section 564(b)(1) of the Act, 21 U.S.C. section 360bbb-3(b)(1), unless the authorization is terminated or revoked sooner.  Performed at [facility], [place] [address], [place], [place] [address]     RADIOLOGY STUDIES/RESULTS: CT CHEST WO CONTRAST  Result Date: 09/25/2021 CLINICAL DATA:  Pneumonia.  History of B-cell lymphoma. EXAM: CT CHEST WITHOUT CONTRAST TECHNIQUE: Multidetector CT imaging of the chest was performed following the standard protocol without IV contrast. RADIATION DOSE REDUCTION: This exam was performed according to the departmental dose-optimization program which includes automated exposure control, adjustment of the mA and/or kV according to patient size and/or use of iterative reconstruction technique. COMPARISON:  PET-CT 04/02/2021 FINDINGS: Cardiovascular: Heart is moderately enlarged, unchanged. There is no pericardial effusion. Thoracic aorta is normal in size. There are severe atherosclerotic calcifications of the aorta and coronary arteries. Abdominal aortic aneurysm measuring up to 4.7 cm is incompletely imaged and unchanged. Note is made that this is incompletely evaluated on this study. Mediastinum/Nodes: There is a moderate-sized hiatal hernia similar to the prior study. Visualized thyroid gland and esophagus are within normal limits. There are no enlarged mediastinal or hilar lymph nodes. Lungs/Pleura: There are bilateral  calcified pleural plaques similar to the prior study. There is minimal atelectasis in the lung bases. There is no pleural effusion or pneumothorax. Upper Abdomen: No acute findings. Musculoskeletal: No acute fractures are seen. Degenerative changes affect the shoulders. IMPRESSION: 1. No acute cardiopulmonary process identified. 2. Stable cardiomegaly. 3. Stable calcified pleural plaques. 4. Stable moderate-sized hiatal hernia. 5. 4.7 cm abdominal aortic aneurysm. Recommend follow-up every 6 months and vascular consultation. Reference: J Am Coll Radiol 2409;[id number]. Aortic Atherosclerosis (ICD10-I70.0). Electronically Signed   By: Ronney Asters M.D.   On: 09/25/2021 20:48   DG Chest Port 1 View  Result Date: 09/25/2021 CLINICAL DATA:  Questionable sepsis - evaluate for abnormality EXAM: PORTABLE CHEST 1 VIEW COMPARISON:  Radiograph 05/27/2021  FINDINGS: Enlarged cardiac silhouette with mild pulmonary vascular congestion. Right chest port has been removed. Faint lower lung airspace opacities bilaterally. Unchanged left-sided calcified pleural plaque. Bibasilar subsegmental atelectasis. No pleural effusion. No pneumothorax. 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 No acute osseous abnormality. Thoracic spondylosis. IMPRESSION: Enlarged cardiac silhouette with central pulmonary vascular congestion. Faint lower lung opacities which could be atelectasis or developing infection. Electronically Signed   By: Maurine Simmering M.D.   On: 09/25/2021 17:07     LOS: 1 day   Oren Binet, MD  [facility]    To contact the attending provider between 7A-7P or the covering provider during after hours 7P-7A, please log into the web site [email or web address] and access using universal [place] password for that web site. If you do not have the password, please call the hospital operator.  09/26/2021, 11:52 AM

## 2021-09-26 NOTE — Progress Notes (Signed)
PHARMACY - PHYSICIAN COMMUNICATION CRITICAL VALUE ALERT - BLOOD CULTURE IDENTIFICATION (BCID)  [name] [name] is an 87 y.o. male who presented to [facility] on 09/25/2021 with a chief complaint of UTI/sepsis  Name of physician (or Provider) Contacted: Dr. Cyd Silence  Current antibiotics: Ceftriaxone   Changes to prescribed antibiotics recommended:  Patient is on recommended antibiotics - No changes needed  Results for orders placed or performed during the hospital encounter of 09/25/21  Blood Culture ID Panel (Reflexed) (Collected: 09/25/2021  4:49 PM)  Result Value Ref Range   Enterococcus faecalis NOT DETECTED NOT DETECTED   Enterococcus Faecium NOT DETECTED NOT DETECTED   Listeria monocytogenes NOT DETECTED NOT DETECTED   Staphylococcus species NOT DETECTED NOT DETECTED   Staphylococcus aureus (BCID) NOT DETECTED NOT DETECTED   Staphylococcus epidermidis NOT DETECTED NOT DETECTED   Staphylococcus lugdunensis NOT DETECTED NOT DETECTED   Streptococcus species NOT DETECTED NOT DETECTED   Streptococcus agalactiae NOT DETECTED NOT DETECTED   Streptococcus pneumoniae NOT DETECTED NOT DETECTED   Streptococcus pyogenes NOT DETECTED NOT DETECTED   A.calcoaceticus-baumannii NOT DETECTED NOT DETECTED   Bacteroides fragilis NOT DETECTED NOT DETECTED   Enterobacterales DETECTED (A) NOT DETECTED   Enterobacter cloacae complex NOT DETECTED NOT DETECTED   Escherichia coli DETECTED (A) NOT DETECTED   Klebsiella aerogenes NOT DETECTED NOT DETECTED   Klebsiella oxytoca NOT DETECTED NOT DETECTED   Klebsiella pneumoniae NOT DETECTED NOT DETECTED   Proteus species NOT DETECTED NOT DETECTED   Salmonella species NOT DETECTED NOT DETECTED   Serratia marcescens NOT DETECTED NOT DETECTED   Haemophilus influenzae NOT DETECTED NOT DETECTED   Neisseria meningitidis NOT DETECTED NOT DETECTED   Pseudomonas aeruginosa NOT DETECTED NOT DETECTED   Stenotrophomonas maltophilia NOT DETECTED NOT DETECTED    Candida albicans NOT DETECTED NOT DETECTED   Candida auris NOT DETECTED NOT DETECTED   Candida glabrata NOT DETECTED NOT DETECTED   Candida krusei NOT DETECTED NOT DETECTED   Candida parapsilosis NOT DETECTED NOT DETECTED   Candida tropicalis NOT DETECTED NOT DETECTED   Cryptococcus neoformans/gattii NOT DETECTED NOT DETECTED   CTX-M ESBL NOT DETECTED NOT DETECTED   Carbapenem resistance IMP NOT DETECTED NOT DETECTED   Carbapenem resistance KPC NOT DETECTED NOT DETECTED   Carbapenem resistance NDM NOT DETECTED NOT DETECTED   Carbapenem resist OXA 48 LIKE NOT DETECTED NOT DETECTED   Carbapenem resistance VIM NOT DETECTED NOT DETECTED    Narda Bonds 09/26/2021  11:32 PM

## 2021-09-26 NOTE — Assessment & Plan Note (Signed)
 #)   Sepsis due to acute cystitis: In the setting of patient's report of 1 day of objective fever, temperature max of 101 at home associated with full body rigors and chills, increasing risk for associated bacteremia, SIRS criteria also noted to be positive for tachycardia, tachypnea, while urinalysis appears suggestive of UTI in the setting of significant pyuria, many bacteria, moderate leukocyte esterase, and this cloudy appearing specimen without any associated squamous epithelial cells to suggest a contaminated specimen.  Of note, in the absence of any evidence of end organ damage, including a non-elevated presenting LA of 1.5, pt's sepsis does not meet criteria to be considered severe in nature. Also, in the absence of LA level greater than or equal to 4.0, and in the absence of any associated hypotension refractory to IVF's, there are no indications for administration of a 30 mL/kg IVF bolus at this time.   Additional ED work-up/management notable for: Collection of blood cultures x2 as well as urine culture prior to initiation of Rocephin, which will be continued for period.  [place] urinary tract infection.   No e/o additional infectious process at this time, including COVID-19 PCR negative, We will CT chest shows no evidence of acute cardiopulmonary process, including no evidence of infiltrate.   Plan: CBC w/ diff and CMP in AM. Follow for results of blood cx's x 2 as well as urine culture. Abx: Continue Rocephin.  Hold home dapagliflozin for now in the setting of urinary tract infection.

## 2021-09-26 NOTE — Evaluation (Signed)
Occupational Therapy Evaluation Patient Details Name: [name] [name] MRN: [id number] DOB: Aug 20, 1934 Today's Date: 09/26/2021   History of Present Illness [name] is a 87 y.o. male with medical history significant for history of large cell lymphoma, reportedly now in remission, chronic systolic/diastolic heart failure, chronic right-sided heart failure, hypertension, hyperlipidemia, who is admitted to [facility] on 09/25/2021 with sepsis due to acute cystitis after presenting from home to [facility] ED complaining of fever.   Clinical Impression   Pt is currently min assist level for simulated selfcare tasks sit to stand and for functional transfers with use of his single point cane.  Feel he will benefit from acute care OT to help progress back to a modified independent/supervision level for ADLs in order to return home with his spouse.  Will continue to follow while inpatient.      Recommendations for follow up therapy are one component of a multi-disciplinary discharge planning process, led by the attending physician.  Recommendations may be updated based on patient status, additional functional criteria and insurance authorization.   Follow Up Recommendations  Home health OT    Assistance Recommended at Discharge Frequent or constant Supervision/Assistance  Patient can return home with the following A little help with walking and/or transfers;A little help with bathing/dressing/bathroom;Assistance with cooking/housework;Assist for transportation;Help with stairs or ramp for entrance    Functional Status Assessment  Patient has had a recent decline in their functional status and demonstrates the ability to make significant improvements in function in a reasonable and predictable amount of time.  Equipment Recommendations  None recommended by OT       Precautions / Restrictions Precautions Precautions: Fall Restrictions Weight Bearing Restrictions: No      Mobility  Bed Mobility Overal bed mobility: Needs Assistance Bed Mobility: Supine to Sit, Sit to Supine     Supine to sit: Min assist Sit to supine: Supervision   General bed mobility comments: Pt needed assist for bringing trunk up to sitting but managed his LEs without assistance.    Transfers Overall transfer level: Needs assistance Equipment used: Straight cane Transfers: Sit to/from Stand, Bed to chair/wheelchair/BSC Sit to Stand: Min assist     Step pivot transfers: Min assist     General transfer comment: One LOB to the left noted with initial stepping with use of the single point cane.  Pt with decreased step length on the left, only stepping up to the right leg during ambulation with use of the cane.      Balance Overall balance assessment: Needs assistance Sitting-balance support: Feet supported Sitting balance-Leahy Scale: Fair     Standing balance support: During functional activity, Reliant on assistive device for balance Standing balance-Leahy Scale: Fair Standing balance comment: Pt needs UE support during dynamic balance tasks or mobility.                           ADL either performed or assessed with clinical judgement   ADL Overall ADL's : Needs assistance/impaired     Grooming: Wash/dry hands;Wash/dry face;Set up;Sitting       Lower Body Bathing: Minimal assistance;Sit to/from stand   Upper Body Dressing : Minimal assistance;Sitting   Lower Body Dressing: Minimal assistance;Sit to/from stand   Toilet Transfer: Minimal assistance;BSC/3in1 (stand pivot)   Toileting- Clothing Manipulation and Hygiene: Minimal assistance;Sit to/from stand       Functional mobility during ADLs: Minimal assistance;Cane General ADL Comments: Pt with increased balance deficits  compared to baseline with pt being aware of them as well.  Reports spouse at home who is in decent health as well as daughter and son in the area as needed.     Vision Baseline  Vision/History: 1 Wears glasses Ability to See in Adequate Light: 0 Adequate Patient Visual Report: No change from baseline Vision Assessment?: No apparent visual deficits     Perception Perception Perception: Within Functional Limits   Praxis Praxis Praxis: Intact    Pertinent Vitals/Pain Pain Assessment Pain Assessment: No/denies pain     Hand Dominance Right   Extremity/Trunk Assessment Upper Extremity Assessment Upper Extremity Assessment: RUE deficits/detail;LUE deficits/detail RUE Deficits / Details: AROM shoulder flexion 0-110 degrees with decreased internal rotation also noted when attempting to reach behind him.  All other joints AROM WFLS with strength 3+/5 RUE: Shoulder pain with ROM RUE Sensation: WNL RUE Coordination: decreased fine motor LUE Deficits / Details: AROM shoulder flexion 0-50 degrees with decreased internal rotation also noted when attempting to reach behind him.  All other joints AROM WFLS with strength 3+/5 LUE: Shoulder pain with ROM LUE Sensation: WNL LUE Coordination: decreased fine motor   Lower Extremity Assessment Lower Extremity Assessment: Defer to PT evaluation   Cervical / Trunk Assessment Cervical / Trunk Assessment: Normal   Communication Communication Communication: HOH   Cognition Arousal/Alertness: Awake/alert Behavior During Therapy: WFL for tasks assessed/performed Overall Cognitive Status: No family/caregiver present to determine baseline cognitive functioning                                 General Comments: Pt able to follow directions well and was oriented to place time and situation.  He did only remember 1/3 words after 5 min delay.                Home Living Family/patient expects to be discharged to:: Private residence Living Arrangements: Spouse/significant other Available Help at Discharge: Family;Available 24 hours/day Type of Home: House Home Access: Stairs to enter [employer]  of Steps: 3 Entrance Stairs-Rails: Right;Left Home Layout: One level     Bathroom Shower/Tub: Occupational psychologist: Handicapped height Bathroom Accessibility: Yes   Home Equipment: Conservation officer, nature (2 wheels);Cane - single point;Shower seat          Prior Functioning/Environment Prior Level of Function : Independent/Modified Independent;Driving             Mobility Comments: uses SPC at baseline, drives, shops ADLs Comments: independent        OT Problem List: Decreased range of motion;Decreased strength;Impaired balance (sitting and/or standing);Decreased activity tolerance;Decreased knowledge of use of DME or AE      OT Treatment/Interventions: Self-care/ADL training;Therapeutic exercise;Balance training;Therapeutic activities;Energy conservation;DME and/or AE instruction;Patient/family education;Cognitive remediation/compensation    OT Goals(Current goals can be found in the care plan section) Acute Rehab OT Goals Patient Stated Goal: Pt did not state during session but agreeable to working with OT. OT Goal Formulation: With patient Time For Goal Achievement: 10/10/21 Potential to Achieve Goals: Good  OT Frequency: Min 2X/week       AM-PAC OT "6 Clicks" Daily Activity     Outcome Measure Help from another person eating meals?: None Help from another person taking care of personal grooming?: A Little Help from another person toileting, which includes using toliet, bedpan, or urinal?: A Little Help from another person bathing (including washing, rinsing, drying)?: A Little Help from another person  to put on and taking off regular upper body clothing?: A Little Help from another person to put on and taking off regular lower body clothing?: A Little 6 Click Score: 19   End of Session Equipment Utilized During Treatment: Gait belt;Other (comment) (single point cane) Nurse Communication: Mobility status  Activity Tolerance: Patient tolerated treatment  well Patient left: in bed;with call bell/phone within reach;with nursing/sitter in room  OT Visit Diagnosis: Unsteadiness on feet (R26.81);Muscle weakness (generalized) (M62.81)                Time: 2182-8833 OT Time Calculation (min): 30 min Charges:  OT General Charges $OT Visit: 1 Visit OT Evaluation $OT Eval Moderate Complexity: 1 Mod OT Treatments $Therapeutic Activity: 8-22 mins  Braydn Carneiro OTR/L 09/26/2021, 12:23 PM

## 2021-09-26 NOTE — Assessment & Plan Note (Signed)
 #)   GERD: documented h/o such; on Protonix as outpatient.   Plan: continue home PPI.

## 2021-09-26 NOTE — Assessment & Plan Note (Signed)
(  please see "sepsis")

## 2021-09-27 DIAGNOSIS — R531 Weakness: Secondary | ICD-10-CM | POA: Diagnosis not present

## 2021-09-27 DIAGNOSIS — A419 Sepsis, unspecified organism: Secondary | ICD-10-CM | POA: Diagnosis not present

## 2021-09-27 DIAGNOSIS — I5042 Chronic combined systolic (congestive) and diastolic (congestive) heart failure: Secondary | ICD-10-CM | POA: Diagnosis not present

## 2021-09-27 DIAGNOSIS — I7143 Infrarenal abdominal aortic aneurysm, without rupture: Secondary | ICD-10-CM | POA: Diagnosis not present

## 2021-09-27 LAB — CBC
HCT: 35.4 % — ABNORMAL LOW (ref 39.0–52.0)
Hemoglobin: 11.3 g/dL — ABNORMAL LOW (ref 13.0–17.0)
MCH: 23.9 pg — ABNORMAL LOW (ref 26.0–34.0)
MCHC: 31.9 g/dL (ref 30.0–36.0)
MCV: 75 fL — ABNORMAL LOW (ref 80.0–100.0)
Platelets: 65 10*3/uL — ABNORMAL LOW (ref 150–400)
RBC: 4.72 MIL/uL (ref 4.22–5.81)
RDW: 21.9 % — ABNORMAL HIGH (ref 11.5–15.5)
WBC: 4.8 10*3/uL (ref 4.0–10.5)
nRBC: 0 % (ref 0.0–0.2)

## 2021-09-27 LAB — MAGNESIUM: Magnesium: 1.6 mg/dL — ABNORMAL LOW (ref 1.7–2.4)

## 2021-09-27 LAB — BASIC METABOLIC PANEL
Anion gap: 11 (ref 5–15)
BUN: 49 mg/dL — ABNORMAL HIGH (ref 8–23)
CO2: 20 mmol/L — ABNORMAL LOW (ref 22–32)
Calcium: 8.2 mg/dL — ABNORMAL LOW (ref 8.9–10.3)
Chloride: 109 mmol/L (ref 98–111)
Creatinine, Ser: 2.23 mg/dL — ABNORMAL HIGH (ref 0.61–1.24)
GFR, Estimated: 28 mL/min — ABNORMAL LOW (ref >60)
Glucose, Bld: 117 mg/dL — ABNORMAL HIGH (ref 70–99)
Potassium: 4.2 mmol/L (ref 3.5–5.1)
Sodium: 140 mmol/L (ref 135–145)

## 2021-09-27 MED ORDER — SODIUM CHLORIDE 0.9 % IV SOLN: INTRAVENOUS | Status: AC

## 2021-09-27 MED ORDER — HYDRALAZINE HCL 50 MG PO TABS
50.0000 mg | ORAL_TABLET | Freq: Three times a day (TID) | ORAL | Status: DC
  Administered 2021-09-27 (×2): 50 mg via ORAL
  Filled 2021-09-27 (×3): qty 1

## 2021-09-27 MED ORDER — MAGNESIUM SULFATE 2 GM/50ML IV SOLN
2.0000 g | Freq: Once | INTRAVENOUS | Status: AC
  Administered 2021-09-27: 2 g via INTRAVENOUS
  Filled 2021-09-27: qty 50

## 2021-09-27 MED ORDER — CARVEDILOL 25 MG PO TABS: 25.0000 mg | ORAL_TABLET | Freq: Two times a day (BID) | ORAL | Status: DC

## 2021-09-27 MED ORDER — LABETALOL HCL 5 MG/ML IV SOLN
20.0000 mg | INTRAVENOUS | Status: DC | PRN
  Administered 2021-09-27: 20 mg via INTRAVENOUS
  Filled 2021-09-27: qty 4

## 2021-09-27 MED ORDER — CARVEDILOL 12.5 MG PO TABS
12.5000 mg | ORAL_TABLET | Freq: Two times a day (BID) | ORAL | Status: DC
  Administered 2021-09-27: 12.5 mg via ORAL
  Filled 2021-09-27: qty 1

## 2021-09-27 MED ORDER — HYDRALAZINE HCL 20 MG/ML IJ SOLN
10.0000 mg | Freq: Four times a day (QID) | INTRAMUSCULAR | Status: DC | PRN
  Administered 2021-09-27: 10 mg via INTRAVENOUS
  Filled 2021-09-27: qty 1

## 2021-09-27 NOTE — Progress Notes (Addendum)
PROGRESS NOTE        PATIENT DETAILS Name: [name] [name] Age: 87 y.o. Sex: male Date of Birth: 08-30-1934 Admit Date: 09/25/2021 Admitting Physician Evalee Mutton Kristeen Mans, MD [id number], Elayne Snare, MD  Brief Summary: Patient is a 87 y.o.  male with history of large cell lymphoma (in remission), chronic systolic/diastolic heart failure, HTN, HLD-who presented with fever/chills-was ultimately found to have sepsis due to E. coli bacteremia/UTI  Significant events: 6/29>> admit to [facility] for sepsis due to complicated UTI.  Significant studies: 4/23>> Echo: EF 63-33%, RV systolic function moderately reduced, grade 3 diastolic dysfunction. 6/29>> CXR: No obvious PNA. 6/29>> CT chest: No PNA-4.7 cm abdominal aortic aneurysm.  Significant microbiology data: 6/29>> blood culture: E. coli 6/29>> urine culture: Gram-negative rods  Procedures: None  Consults: None  Subjective: Wants to go home-no complaints.  Blood pressure was significantly elevated last night and given as needed-on the lower side this morning.  Objective: Vitals: Blood pressure (!) 115/97, pulse 78, temperature 97.9 F (36.6 C), temperature source Oral, resp. rate 16, height '5\' 8"'$  (1.727 m), SpO2 96 %.   Exam: Gen Exam:Alert awake-not in any distress HEENT:atraumatic, normocephalic Chest: B/L clear to auscultation anteriorly CVS:S1S2 regular Abdomen:soft non tender, non distended Extremities:no edema Neurology: Non focal Skin: no rash  Pertinent Labs/Radiology:    Latest Ref Rng & Units 09/27/2021    2:31 AM 09/26/2021    4:32 AM 09/25/2021    4:57 PM  CBC  WBC 4.0 - 10.5 K/uL 4.8  9.9  6.8   Hemoglobin 13.0 - 17.0 g/dL 11.3  12.1  13.5   Hematocrit 39.0 - 52.0 % 35.4  39.2  43.2   Platelets 150 - 400 K/uL 65  73  87     Lab Results  Component Value Date   NA 140 09/27/2021   K 4.2 09/27/2021   CL 109 09/27/2021   CO2 20 (L) 09/27/2021      Assessment/Plan: Sepsis due to  E. coli bacteremia and E. coli complicated UTI (POA): Sepsis physiology has resolved-feels better-no further fever overnight-continue IV Rocephin.  AKI on CKD stage IIIb: Slight jump in creatinine overnight-suspect multifactorial etiology-from underlying sepsis/bacteremia/transient hypotension this morning.  Stopping Entresto/Lasix today-we will hydrate for a few hours-repeat electrolytes tomorrow.  Allow some amount of permissive hypertension.  Chronic combined systolic/diastolic heart failure: Volume status stable-holding Entresto/Lasix-see above-continue Coreg/hydralazine/Imdur.    History of CAD-s/p PCI in 2015: Currently no anginal symptoms-continue aspirin/Plavix/beta-blocker/statin  History of PAD-s/p PCI to iliac artery in 2015-s/p right carotid endarterectomy 2017-known occluded left carotid artery: Continue antiplatelet/statin  HTN: BP fluctuating quite a bit-significantly elevated last night given IV antihypertensives-on the softer side this morning-stopping Entresto/Lasix due to AKI-continue Coreg/hydralazine/Imdur-follow closely.  HLD: Continue statin  GERD: Continue PPI  History of large B-cell lymphoma: Followed by oncology-completed 6 cycles of chemo January 2023.  Apparently in remission but if platelet counts do not improve-in spite of antimicrobial therapy-May need hematology reevaluation at some point.  Thrombocytopenia: Mild-appears to have developed since March-unclear etiology-seems to to have worsened due to gram-negative bacteremia/sepsis-continue to watch closely--we will stop prophylactic Lovenox today.  BPH: Continue Flomax  4.7 cm abdominal aortic aneurysm: Repeat imaging every 6 months.  Debility/deconditioning: Await PT/OT eval. BMI Estimated body mass index is 25.24 kg/m as calculated from the following:   Height as of this encounter: '5\' 8"'$  (  1.727 m).   Weight as of 09/18/21: 75.3 kg.   Code status:   Code Status: Full Code   DVT Prophylaxis: SCDs  Start: 09/25/21 2208   Family Communication: Daughter-Janice-[phone] updated over the phone on 7/01   Disposition Plan: Status is: Inpatient Remains inpatient appropriate because:    Planned Discharge Destination:Home health   Diet: Diet Order             Diet regular Room service appropriate? Yes; Fluid consistency: Thin  Diet effective now                     Antimicrobial agents: Anti-infectives (From admission, onward)    Start     Dose/Rate Route Frequency Ordered Stop   09/26/21 2200  cefTRIAXone (ROCEPHIN) 2 g in sodium chloride 0.9 % 100 mL IVPB        2 g 200 mL/hr over 30 Minutes Intravenous Every 24 hours 09/26/21 1154     09/26/21 1600  cefTRIAXone (ROCEPHIN) 1 g in sodium chloride 0.9 % 100 mL IVPB  Status:  Discontinued        1 g 200 mL/hr over 30 Minutes Intravenous Every 24 hours 09/25/21 2208 09/26/21 1154   09/25/21 2200  cefTRIAXone (ROCEPHIN) 1 g in sodium chloride 0.9 % 100 mL IVPB        1 g 200 mL/hr over 30 Minutes Intravenous  Once 09/25/21 2153 09/25/21 2245        MEDICATIONS: Scheduled Meds:  aspirin EC  81 mg Oral Daily   atorvastatin  20 mg Oral Daily   carvedilol  12.5 mg Oral BID WC   clopidogrel  75 mg Oral Daily   ferrous sulfate  325 mg Oral Q breakfast   hydrALAZINE  50 mg Oral Q8H   isosorbide mononitrate  30 mg Oral Daily   pantoprazole  20 mg Oral BID   tamsulosin  0.4 mg Oral Daily   Continuous Infusions:  sodium chloride 50 mL/hr at 09/27/21 0850   cefTRIAXone (ROCEPHIN)  IV Stopped (09/26/21 2308)   PRN Meds:.acetaminophen **OR** acetaminophen, hydrALAZINE, labetalol   I have personally reviewed following labs and imaging studies  LABORATORY DATA: CBC: Recent Labs  Lab 09/25/21 1657 09/26/21 0432 09/27/21 0231  WBC 6.8 9.9 4.8  NEUTROABS 6.4 8.9*  --   HGB 13.5 12.1* 11.3*  HCT 43.2 39.2 35.4*  MCV 78.0* 76.3* 75.0*  PLT 87* 73* 65*    Basic Metabolic Panel: Recent Labs  Lab  09/25/21 1657 09/26/21 0432 09/27/21 0231  NA 138 139 140  K 4.7 4.2 4.2  CL 108 110 109  CO2 19* 20* 20*  GLUCOSE 146* 117* 117*  BUN 40* 36* 49*  CREATININE 1.76* 1.58* 2.23*  CALCIUM 8.6* 8.7* 8.2*  MG  --  1.8 1.6*    GFR: Estimated Creatinine Clearance: 23.9 mL/min (A) (by C-G formula based on SCr of 2.23 mg/dL (H)).  Liver Function Tests: Recent Labs  Lab 09/25/21 1657 09/26/21 0432  AST 17 21  ALT 10 11  ALKPHOS 71 56  BILITOT 0.3 0.7  PROT 6.3* 6.0*  ALBUMIN 3.7 3.4*   No results for input(s): "LIPASE", "AMYLASE" in the last 168 hours. No results for input(s): "AMMONIA" in the last 168 hours.  Coagulation Profile: Recent Labs  Lab 09/25/21 1657  INR 1.2    Cardiac Enzymes: No results for input(s): "CKTOTAL", "CKMB", "CKMBINDEX", "TROPONINI" in the last 168 hours.  BNP (last 3 results) No results for  input(s): "PROBNP" in the last 8760 hours.  Lipid Profile: No results for input(s): "CHOL", "HDL", "LDLCALC", "TRIG", "CHOLHDL", "LDLDIRECT" in the last 72 hours.  Thyroid Function Tests: Recent Labs    09/26/21 0432  TSH 0.911    Anemia Panel: No results for input(s): "VITAMINB12", "FOLATE", "FERRITIN", "TIBC", "IRON", "RETICCTPCT" in the last 72 hours.  Urine analysis:    Component Value Date/Time   COLORURINE YELLOW 09/25/2021 2125   APPEARANCEUR CLOUDY (A) 09/25/2021 2125   LABSPEC 1.016 09/25/2021 2125   PHURINE 5.0 09/25/2021 2125   GLUCOSEU NEGATIVE 09/25/2021 2125   HGBUR SMALL (A) 09/25/2021 2125   BILIRUBINUR NEGATIVE 09/25/2021 2125   [place] NEGATIVE 09/25/2021 2125   PROTEINUR 30 (A) 09/25/2021 2125   NITRITE NEGATIVE 09/25/2021 2125   LEUKOCYTESUR MODERATE (A) 09/25/2021 2125    Sepsis Labs: Lactic Acid, Venous    Component Value Date/Time   LATICACIDVEN 1.5 09/25/2021 1659    MICROBIOLOGY: Recent Results (from the past 240 hour(s))  Blood Culture (routine x 2)     Status: Abnormal (Preliminary result)    Collection Time: 09/25/21  4:49 PM   Specimen: BLOOD  Result Value Ref Range Status   Specimen Description BLOOD RIGHT ANTECUBITAL  Final   Special Requests   Final    BOTTLES DRAWN AEROBIC AND ANAEROBIC Blood Culture adequate volume   Culture  Setup Time   Final    GRAM NEGATIVE RODS AEROBIC BOTTLE ONLY CRITICAL RESULT CALLED TO, READ BACK BY AND VERIFIED WITH: PHARMD JAMES LEDFORD 09/26/21'@23'$ :25  BY TW Performed at [facility], [place] [address] [place], [place] [address]    Culture ESCHERICHIA COLI (A)  Final   Report Status PENDING  Incomplete  Blood Culture ID Panel (Reflexed)     Status: Abnormal   Collection Time: 09/25/21  4:49 PM  Result Value Ref Range Status   Enterococcus faecalis NOT DETECTED NOT DETECTED Final   Enterococcus Faecium NOT DETECTED NOT DETECTED Final   Listeria monocytogenes NOT DETECTED NOT DETECTED Final   Staphylococcus species NOT DETECTED NOT DETECTED Final   Staphylococcus aureus (BCID) NOT DETECTED NOT DETECTED Final   Staphylococcus epidermidis NOT DETECTED NOT DETECTED Final   Staphylococcus lugdunensis NOT DETECTED NOT DETECTED Final   Streptococcus species NOT DETECTED NOT DETECTED Final   Streptococcus agalactiae NOT DETECTED NOT DETECTED Final   Streptococcus pneumoniae NOT DETECTED NOT DETECTED Final   Streptococcus pyogenes NOT DETECTED NOT DETECTED Final   A.calcoaceticus-baumannii NOT DETECTED NOT DETECTED Final   Bacteroides fragilis NOT DETECTED NOT DETECTED Final   Enterobacterales DETECTED (A) NOT DETECTED Final    Comment: Enterobacterales represent a large order of gram negative bacteria, not a single organism. CRITICAL RESULT CALLED TO, READ BACK BY AND VERIFIED WITH: PHARMD JAMES LEDFORD 09/26/21'@23'$ :25  BY TW    Enterobacter cloacae complex NOT DETECTED NOT DETECTED Final   Escherichia coli DETECTED (A) NOT DETECTED Final    Comment: CRITICAL RESULT CALLED TO, READ BACK BY AND VERIFIED WITH: PHARMD JAMES LEDFORD  09/26/21'@23'$ :25  BY TW    Klebsiella aerogenes NOT DETECTED NOT DETECTED Final   Klebsiella oxytoca NOT DETECTED NOT DETECTED Final   Klebsiella pneumoniae NOT DETECTED NOT DETECTED Final   Proteus species NOT DETECTED NOT DETECTED Final   Salmonella species NOT DETECTED NOT DETECTED Final   Serratia marcescens NOT DETECTED NOT DETECTED Final   Haemophilus influenzae NOT DETECTED NOT DETECTED Final   Neisseria meningitidis NOT DETECTED NOT DETECTED Final   Pseudomonas aeruginosa NOT DETECTED  NOT DETECTED Final   Stenotrophomonas maltophilia NOT DETECTED NOT DETECTED Final   Candida albicans NOT DETECTED NOT DETECTED Final   Candida auris NOT DETECTED NOT DETECTED Final   Candida glabrata NOT DETECTED NOT DETECTED Final   Candida krusei NOT DETECTED NOT DETECTED Final   Candida parapsilosis NOT DETECTED NOT DETECTED Final   Candida tropicalis NOT DETECTED NOT DETECTED Final   Cryptococcus neoformans/gattii NOT DETECTED NOT DETECTED Final   CTX-M ESBL NOT DETECTED NOT DETECTED Final   Carbapenem resistance IMP NOT DETECTED NOT DETECTED Final   Carbapenem resistance KPC NOT DETECTED NOT DETECTED Final   Carbapenem resistance NDM NOT DETECTED NOT DETECTED Final   Carbapenem resist OXA 48 LIKE NOT DETECTED NOT DETECTED Final   Carbapenem resistance VIM NOT DETECTED NOT DETECTED Final    Comment: Performed at [facility], [address] [place], [place] [address]  SARS Coronavirus 2 by RT PCR (hospital order, performed in [facility] hospital lab) *cepheid single result test* Anterior Nasal Swab     Status: None   Collection Time: 09/25/21  4:53 PM   Specimen: Anterior Nasal Swab  Result Value Ref Range Status   SARS Coronavirus 2 by RT PCR NEGATIVE NEGATIVE Final    Comment: (NOTE) SARS-CoV-2 target nucleic acids are NOT DETECTED.  The SARS-CoV-2 RNA is generally detectable in upper and lower respiratory specimens during the acute phase of infection. The lowest concentration of  SARS-CoV-2 viral copies this assay can detect is 250 copies / mL. A negative result does not preclude SARS-CoV-2 infection and should not be used as the sole basis for treatment or other patient management decisions.  A negative result may occur with improper specimen collection / handling, submission of specimen other than nasopharyngeal swab, presence of viral mutation(s) within the areas targeted by this assay, and inadequate number of viral copies (<250 copies / mL). A negative result must be combined with clinical observations, patient history, and epidemiological information.  Fact Sheet for Patients:   https://www.patel.info/  Fact Sheet for Healthcare Providers: https://hall.com/  This test is not yet approved or  cleared by the [place] FDA and has been authorized for detection and/or diagnosis of SARS-CoV-2 by FDA under an Emergency Use Authorization (EUA).  This EUA will remain in effect (meaning this test can be used) for the duration of the COVID-19 declaration under Section 564(b)(1) of the Act, 21 U.S.C. section 360bbb-3(b)(1), unless the authorization is terminated or revoked sooner.  Performed at [facility], [place] [address], [place], [place] [address]   Urine Culture     Status: Abnormal (Preliminary result)   Collection Time: 09/25/21  9:25 PM   Specimen: In/Out Cath Urine  Result Value Ref Range Status   Specimen Description IN/OUT CATH URINE  Final   Special Requests NONE  Final   Culture (A)  Final    >=100,000 COLONIES/mL GRAM NEGATIVE RODS SUSCEPTIBILITIES TO FOLLOW Performed at [facility], [place] [address], [place], [place] [address]    Report Status PENDING  Incomplete    RADIOLOGY STUDIES/RESULTS: CT CHEST WO CONTRAST  Result Date: 09/25/2021 CLINICAL DATA:  Pneumonia.  History of B-cell lymphoma. EXAM: CT CHEST WITHOUT CONTRAST TECHNIQUE: Multidetector CT imaging of the chest was  performed following the standard protocol without IV contrast. RADIATION DOSE REDUCTION: This exam was performed according to the departmental dose-optimization program which includes automated exposure control, adjustment of the mA and/or kV according to patient size and/or use of iterative  reconstruction technique. COMPARISON:  PET-CT 04/02/2021 FINDINGS: Cardiovascular: Heart is moderately enlarged, unchanged. There is no pericardial effusion. Thoracic aorta is normal in size. There are severe atherosclerotic calcifications of the aorta and coronary arteries. Abdominal aortic aneurysm measuring up to 4.7 cm is incompletely imaged and unchanged. Note is made that this is incompletely evaluated on this study. Mediastinum/Nodes: There is a moderate-sized hiatal hernia similar to the prior study. Visualized thyroid gland and esophagus are within normal limits. There are no enlarged mediastinal or hilar lymph nodes. Lungs/Pleura: There are bilateral calcified pleural plaques similar to the prior study. There is minimal atelectasis in the lung bases. There is no pleural effusion or pneumothorax. Upper Abdomen: No acute findings. Musculoskeletal: No acute fractures are seen. Degenerative changes affect the shoulders. IMPRESSION: 1. No acute cardiopulmonary process identified. 2. Stable cardiomegaly. 3. Stable calcified pleural plaques. 4. Stable moderate-sized hiatal hernia. 5. 4.7 cm abdominal aortic aneurysm. Recommend follow-up every 6 months and vascular consultation. Reference: J Am Coll Radiol 7829;[id number]. Aortic Atherosclerosis (ICD10-I70.0). Electronically Signed   By: Ronney Asters M.D.   On: 09/25/2021 20:48   DG Chest Port 1 View  Result Date: 09/25/2021 CLINICAL DATA:  Questionable sepsis - evaluate for abnormality EXAM: PORTABLE CHEST 1 VIEW COMPARISON:  Radiograph 05/27/2021 FINDINGS: Enlarged cardiac silhouette with mild pulmonary vascular congestion. Right chest port has been removed. Faint lower  lung airspace opacities bilaterally. Unchanged left-sided calcified pleural plaque. Bibasilar subsegmental atelectasis. No pleural effusion. No pneumothorax. 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 No acute osseous abnormality. Thoracic spondylosis. IMPRESSION: Enlarged cardiac silhouette with central pulmonary vascular congestion. Faint lower lung opacities which could be atelectasis or developing infection. Electronically Signed   By: Maurine Simmering M.D.   On: 09/25/2021 17:07     LOS: 2 days   Oren Binet, MD  [facility]    To contact the attending provider between 7A-7P or the covering provider during after hours 7P-7A, please log into the web site [email or web address] and access using universal [place] password for that web site. If you do not have the password, please call the hospital operator.  09/27/2021, 12:51 PM

## 2021-09-28 ENCOUNTER — Inpatient Hospital Stay (HOSPITAL_COMMUNITY): Payer: PPO

## 2021-09-28 DIAGNOSIS — A419 Sepsis, unspecified organism: Secondary | ICD-10-CM | POA: Diagnosis not present

## 2021-09-28 LAB — BASIC METABOLIC PANEL
Anion gap: 6 (ref 5–15)
BUN: 51 mg/dL — ABNORMAL HIGH (ref 8–23)
CO2: 20 mmol/L — ABNORMAL LOW (ref 22–32)
Calcium: 7.8 mg/dL — ABNORMAL LOW (ref 8.9–10.3)
Chloride: 114 mmol/L — ABNORMAL HIGH (ref 98–111)
Creatinine, Ser: 2.1 mg/dL — ABNORMAL HIGH (ref 0.61–1.24)
GFR, Estimated: 30 mL/min — ABNORMAL LOW (ref >60)
Glucose, Bld: 141 mg/dL — ABNORMAL HIGH (ref 70–99)
Potassium: 4 mmol/L (ref 3.5–5.1)
Sodium: 140 mmol/L (ref 135–145)

## 2021-09-28 LAB — CULTURE, BLOOD (ROUTINE X 2): Special Requests: ADEQUATE

## 2021-09-28 LAB — CBC
HCT: 34.9 % — ABNORMAL LOW (ref 39.0–52.0)
Hemoglobin: 10.6 g/dL — ABNORMAL LOW (ref 13.0–17.0)
MCH: 23.4 pg — ABNORMAL LOW (ref 26.0–34.0)
MCHC: 30.4 g/dL (ref 30.0–36.0)
MCV: 77 fL — ABNORMAL LOW (ref 80.0–100.0)
Platelets: 68 10*3/uL — ABNORMAL LOW (ref 150–400)
RBC: 4.53 MIL/uL (ref 4.22–5.81)
RDW: 21.9 % — ABNORMAL HIGH (ref 11.5–15.5)
WBC: 5.5 10*3/uL (ref 4.0–10.5)
nRBC: 0 % (ref 0.0–0.2)

## 2021-09-28 LAB — URINE CULTURE: Culture: >=100000 — AB

## 2021-09-28 MED ORDER — SODIUM CHLORIDE 0.9 % IV SOLN: INTRAVENOUS | Status: AC

## 2021-09-28 MED ORDER — CARVEDILOL 6.25 MG PO TABS: 6.2500 mg | ORAL_TABLET | Freq: Two times a day (BID) | ORAL | Status: DC

## 2021-09-28 MED ORDER — HYDRALAZINE HCL 25 MG PO TABS
25.0000 mg | ORAL_TABLET | Freq: Three times a day (TID) | ORAL | Status: DC
  Administered 2021-09-29: 25 mg via ORAL
  Filled 2021-09-28: qty 1

## 2021-09-28 MED ORDER — CARVEDILOL 3.125 MG PO TABS
3.1250 mg | ORAL_TABLET | Freq: Two times a day (BID) | ORAL | Status: DC
  Administered 2021-09-28 – 2021-09-29 (×2): 3.125 mg via ORAL
  Filled 2021-09-28 (×2): qty 1

## 2021-09-28 MED ORDER — SODIUM CHLORIDE 0.9 % IV SOLN: INTRAVENOUS | Status: DC

## 2021-09-28 NOTE — Progress Notes (Signed)
PROGRESS NOTE        PATIENT DETAILS Name: [name] [name] Age: 87 y.o. Sex: male Date of Birth: November 13, 1934 Admit Date: 09/25/2021 Admitting Physician Evalee Mutton Kristeen Mans, MD [id number], Elayne Snare, MD  Brief Summary: Patient is a 87 y.o.  male with history of large cell lymphoma (in remission), chronic systolic/diastolic heart failure, HTN, HLD-who presented with fever/chills-was ultimately found to have sepsis due to E. coli bacteremia/UTI  Significant events: 6/29>> admit to [facility] for sepsis due to complicated UTI.  Significant studies: 4/23>> Echo: EF 56-43%, RV systolic function moderately reduced, grade 3 diastolic dysfunction. 6/29>> CXR: No obvious PNA. 6/29>> CT chest: No PNA-4.7 cm abdominal aortic aneurysm.  Significant microbiology data: 6/29>> blood culture: E. coli 6/29>> urine culture: Gram-negative rods  Procedures: None  Consults: None  Subjective:  Patient in bed, appears comfortable, denies any headache, no fever, no chest pain or pressure, no shortness of breath , no abdominal pain. No new focal weakness.   Objective: Vitals: Blood pressure 110/73, pulse 64, temperature 97.8 F (36.6 C), temperature source Oral, resp. rate 20, height '5\' 8"'$  (1.727 m), weight 75 kg, SpO2 95 %.   Exam:  Awake Alert, No new F.N deficits, Normal affect [name].AT,PERRAL Supple Neck, No JVD,   Symmetrical Chest wall movement, Good air movement bilaterally, CTAB RRR,No Gallops, Rubs or new Murmurs,  +ve B.Sounds, Abd Soft, No tenderness,   No Cyanosis, Clubbing or edema    Assessment/Plan: Sepsis due to E. coli bacteremia and E. coli complicated UTI (POA): Sepsis physiology has resolved-feels better-no further fever overnight-continue IV Rocephin.  AKI on CKD stage IIIb: Hypotensive, blood pressure medications adjusted, Entresto and Lasix held, 1 L of IV fluids over 10 hours on 09/28/2021 and monitor.  Avoid hypotension, will also check renal  ultrasound due to UTI and sepsis along with AKI.  HTN: BP soft, blood pressure medications adjusted on 09/28/2021 IV fluids on 09/28/2021 and monitor  Chronic combined systolic/diastolic heart failure EF 25%: Volume status stable-holding Entresto/Lasix-see above-continue Coreg/hydralazine/Imdur.    History of CAD-s/p PCI in 2015: Currently no anginal symptoms-continue aspirin/Plavix/beta-blocker/statin  History of PAD-s/p PCI to iliac artery in 2015-s/p right carotid endarterectomy 2017-known occluded left carotid artery: Continue antiplatelet/statin  HLD: Continue statin  GERD: Continue PPI  History of large B-cell lymphoma: Followed by oncology-completed 6 cycles of chemo January 2023.  Apparently in remission but if platelet counts do not improve-in spite of antimicrobial therapy-May need hematology reevaluation at some point.  Thrombocytopenia: Mild-appears to have developed since March-unclear etiology-seems to to have worsened due to gram-negative bacteremia/sepsis-continue to watch closely--we will stop prophylactic Lovenox today.  BPH: Continue Flomax  4.7 cm abdominal aortic aneurysm: Repeat imaging every 6 months.  Debility/deconditioning: Await PT/OT eval. BMI Estimated body mass index is 25.14 kg/m as calculated from the following:   Height as of this encounter: '5\' 8"'$  (1.727 m).   Weight as of this encounter: 75 kg.   Code status:   Code Status: Full Code   DVT Prophylaxis: SCDs Start: 09/25/21 2208   Family Communication: Daughter-Janice-[phone] updated over the phone on 7/01   Disposition Plan: Status is: Inpatient Remains inpatient appropriate because:    Planned Discharge Destination:Home health   Diet: Diet Order             Diet regular Room service appropriate? Yes; Fluid consistency: Thin  Diet  effective now                     Antimicrobial agents: Anti-infectives (From admission, onward)    Start     Dose/Rate Route Frequency  Ordered Stop   09/26/21 2200  cefTRIAXone (ROCEPHIN) 2 g in sodium chloride 0.9 % 100 mL IVPB        2 g 200 mL/hr over 30 Minutes Intravenous Every 24 hours 09/26/21 1154     09/26/21 1600  cefTRIAXone (ROCEPHIN) 1 g in sodium chloride 0.9 % 100 mL IVPB  Status:  Discontinued        1 g 200 mL/hr over 30 Minutes Intravenous Every 24 hours 09/25/21 2208 09/26/21 1154   09/25/21 2200  cefTRIAXone (ROCEPHIN) 1 g in sodium chloride 0.9 % 100 mL IVPB        1 g 200 mL/hr over 30 Minutes Intravenous  Once 09/25/21 2153 09/25/21 2245        MEDICATIONS: Scheduled Meds:  aspirin EC  81 mg Oral Daily   atorvastatin  20 mg Oral Daily   carvedilol  3.125 mg Oral BID WC   clopidogrel  75 mg Oral Daily   ferrous sulfate  325 mg Oral Q breakfast   [START ON 09/29/2021] hydrALAZINE  25 mg Oral Q8H   isosorbide mononitrate  30 mg Oral Daily   pantoprazole  20 mg Oral BID   tamsulosin  0.4 mg Oral Daily   Continuous Infusions:  sodium chloride     cefTRIAXone (ROCEPHIN)  IV 2 g (09/27/21 2037)   PRN Meds:.acetaminophen **OR** acetaminophen, hydrALAZINE   I have personally reviewed following labs and imaging studies  LABORATORY DATA:  Recent Labs  Lab 09/25/21 1657 09/26/21 0432 09/27/21 0231 09/28/21 0158  WBC 6.8 9.9 4.8 5.5  HGB 13.5 12.1* 11.3* 10.6*  HCT 43.2 39.2 35.4* 34.9*  PLT 87* 73* 65* 68*  MCV 78.0* 76.3* 75.0* 77.0*  MCH 24.4* 23.5* 23.9* 23.4*  MCHC 31.3 30.9 31.9 30.4  RDW 22.3* 21.9* 21.9* 21.9*  LYMPHSABS 0.2* 0.2*  --   --   MONOABS 0.1 0.7  --   --   EOSABS 0.1 0.0  --   --   BASOSABS 0.0 0.0  --   --     Recent Labs  Lab 09/25/21 1657 09/25/21 1659 09/26/21 0432 09/27/21 0231 09/28/21 0158  NA 138  --  139 140 140  K 4.7  --  4.2 4.2 4.0  CL 108  --  110 109 114*  CO2 19*  --  20* 20* 20*  GLUCOSE 146*  --  117* 117* 141*  BUN 40*  --  36* 49* 51*  CREATININE 1.76*  --  1.58* 2.23* 2.10*  CALCIUM 8.6*  --  8.7* 8.2* 7.8*  AST 17  --  21  --    --   ALT 10  --  11  --   --   ALKPHOS 71  --  56  --   --   BILITOT 0.3  --  0.7  --   --   ALBUMIN 3.7  --  3.4*  --   --   MG  --   --  1.8 1.6*  --   LATICACIDVEN  --  1.5  --   --   --   INR 1.2  --   --   --   --   TSH  --   --  0.911  --   --  BNP 2,061.0*  --   --   --   --            RADIOLOGY STUDIES/RESULTS: No results found.   LOS: 3 days   Signature  Lala Lund M.D on 09/28/2021 at 10:09 AM   -  To page go to [email or web address]

## 2021-09-29 ENCOUNTER — Telehealth: Payer: Self-pay

## 2021-09-29 ENCOUNTER — Other Ambulatory Visit (HOSPITAL_COMMUNITY): Payer: Self-pay

## 2021-09-29 DIAGNOSIS — A419 Sepsis, unspecified organism: Secondary | ICD-10-CM | POA: Diagnosis not present

## 2021-09-29 LAB — CBC WITH DIFFERENTIAL/PLATELET
Abs Immature Granulocytes: 0.06 10*3/uL (ref 0.00–0.07)
Basophils Absolute: 0 10*3/uL (ref 0.0–0.1)
Basophils Relative: 0 %
Eosinophils Absolute: 0.2 10*3/uL (ref 0.0–0.5)
Eosinophils Relative: 4 %
HCT: 35.1 % — ABNORMAL LOW (ref 39.0–52.0)
Hemoglobin: 10.9 g/dL — ABNORMAL LOW (ref 13.0–17.0)
Immature Granulocytes: 1 %
Lymphocytes Relative: 9 %
Lymphs Abs: 0.5 10*3/uL — ABNORMAL LOW (ref 0.7–4.0)
MCH: 23.8 pg — ABNORMAL LOW (ref 26.0–34.0)
MCHC: 31.1 g/dL (ref 30.0–36.0)
MCV: 76.6 fL — ABNORMAL LOW (ref 80.0–100.0)
Monocytes Absolute: 0.7 10*3/uL (ref 0.1–1.0)
Monocytes Relative: 12 %
Neutro Abs: 4.2 10*3/uL (ref 1.7–7.7)
Neutrophils Relative %: 74 %
Platelets: 72 10*3/uL — ABNORMAL LOW (ref 150–400)
RBC: 4.58 MIL/uL (ref 4.22–5.81)
RDW: 22.2 % — ABNORMAL HIGH (ref 11.5–15.5)
Smear Review: NORMAL
WBC: 5.7 10*3/uL (ref 4.0–10.5)
nRBC: 0 % (ref 0.0–0.2)

## 2021-09-29 LAB — BASIC METABOLIC PANEL
Anion gap: 9 (ref 5–15)
BUN: 32 mg/dL — ABNORMAL HIGH (ref 8–23)
CO2: 23 mmol/L (ref 22–32)
Calcium: 8.3 mg/dL — ABNORMAL LOW (ref 8.9–10.3)
Chloride: 111 mmol/L (ref 98–111)
Creatinine, Ser: 1.44 mg/dL — ABNORMAL HIGH (ref 0.61–1.24)
GFR, Estimated: 48 mL/min — ABNORMAL LOW (ref >60)
Glucose, Bld: 115 mg/dL — ABNORMAL HIGH (ref 70–99)
Potassium: 4.5 mmol/L (ref 3.5–5.1)
Sodium: 143 mmol/L (ref 135–145)

## 2021-09-29 LAB — MAGNESIUM: Magnesium: 2 mg/dL (ref 1.7–2.4)

## 2021-09-29 MED ORDER — FUROSEMIDE 20 MG PO TABS: 20.0000 mg | ORAL_TABLET | Freq: Every day | ORAL | 3 refills | Status: DC

## 2021-09-29 MED ORDER — ENTRESTO 49-51 MG PO TABS: 1.0000 | ORAL_TABLET | Freq: Two times a day (BID) | ORAL | 2 refills | Status: DC

## 2021-09-29 MED ORDER — CEPHALEXIN 500 MG PO CAPS
500.0000 mg | ORAL_CAPSULE | Freq: Three times a day (TID) | ORAL | 0 refills | Status: AC
  Filled 2021-09-29: qty 30, 10d supply, fill #0

## 2021-09-29 MED ORDER — CARVEDILOL 3.125 MG PO TABS
3.1250 mg | ORAL_TABLET | Freq: Two times a day (BID) | ORAL | 0 refills | Status: DC
  Filled 2021-09-29: qty 60, 30d supply, fill #0

## 2021-09-29 NOTE — Discharge Summary (Signed)
[name] DOB: 1934/07/02 DOA: 09/25/2021  PCP: Kathyrn Drown, MD  Admit date: 09/25/2021  Discharge date: 09/29/2021  Admitted From: Home   Disposition:  Home   Recommendations for Outpatient Follow-up:   Follow up with PCP in 1-2 weeks  PCP Please obtain BMP/CBC, 2 view CXR in 1week,  (see Discharge instructions)   PCP Please follow up on the following pending results: Kindly see the patient in the next 3 to 4 days reevaluate blood pressure and renal function and readjust blood pressure medications.  Kindly follow his renal function, abdominal aortic aneurysm and history of lymphoma.  Check CBC, BMP and magnesium in 3-4 days.   Home Health: PT, OT, RN if he qualifies Equipment/Devices: As below Consultations: None  Discharge Condition: Stable    CODE STATUS: Full    Diet Recommendation: Heart Healthy     Chief Complaint  Patient presents with   Fever     Brief history of present illness from the day of admission and additional interim summary    87 y.o.  male with history of large cell lymphoma (in remission), chronic systolic/diastolic heart failure, HTN, HLD-who presented with fever/chills-was ultimately found to have sepsis due to E. coli bacteremia/UTI   Significant events: 6/29>> admit to [facility] for sepsis due to complicated UTI.   Significant studies: 4/23>> Echo: EF 50-27%, RV systolic function moderately reduced, grade 3 diastolic dysfunction. 6/29>> CXR: No obvious PNA. 6/29>> CT chest: No PNA-4.7 cm abdominal aortic aneurysm.   Significant microbiology data: 6/29>> blood culture: E. coli 6/29>> urine culture: Gram-negative rods                                                                 Hospital Course    Sepsis due to E. coli bacteremia and E. coli complicated  UTI (POA): Sepsis physiology has resolved-feels better-no further fever overnight-has responded very well to IV Rocephin will be transition to 10 more days of oral Keflex with outpatient PCP follow-up.  Will get home PT, OT and RN if he qualifies along with home walker.   AKI on CKD stage IIIb: Hypotensive, blood pressure medications adjusted, Entresto and Lasix held, 1 L of IV fluids over 10 hours on 09/28/2021, AKI has improved, blood pressure still soft but much better.  Nonacute renal ultrasound.  He is renal function is close to baseline, blood pressure medications adjusted upon discharge, request PCP to kindly evaluate him the next 3 to 4 days recheck his blood pressure and renal function and readjust CHF and blood pressure medications as appropriate.   HTN: BP soft, blood pressure medications adjusted currently see above.  To follow-up with PCP closely.   Chronic combined systolic/diastolic heart failure EF 25%: Volume status stable-currently on Coreg, PCP to reevaluate in 3 to 4  days and then resume Entresto, Lasix and Imdur as appropriate.   History of CAD-s/p PCI in 2015: Currently no anginal symptoms-continue aspirin/Plavix/beta-blocker/statin   History of PAD-s/p PCI to iliac artery in 2015-s/p right carotid endarterectomy 2017-known occluded left carotid artery: Continue antiplatelet/statin   HLD: Continue statin  GERD: Continue PPI   History of large B-cell lymphoma: Followed by oncology-completed 6 cycles of chemo January 2023.  Apparently in remission but if platelet counts do not improve-in spite of antimicrobial therapy-May need hematology reevaluation at some point.  Will defer this to PCP.   Thrombocytopenia: Mild-appears to have developed since March-unclear etiology-seems to to have worsened due to gram-negative bacteremia/sepsis stable.  PCP to check CBC in next visit.   BPH: Continue Flomax   4.7 cm abdominal aortic aneurysm: Repeat imaging every 6 months.  On  beta-blocker.  PCP to monitor.   Debility/deconditioning: Await PT/OT eval.   Discharge diagnosis     Principal Problem:   Sepsis ([place]) Active Problems:   Hyperlipidemia   AAA (abdominal aortic aneurysm) (HCC)   Chronic combined systolic and diastolic heart failure (HCC)   Generalized weakness   GERD (gastroesophageal reflux disease)   Acute cystitis   UTI (urinary tract infection)    Discharge instructions    Discharge Instructions     Diet - low sodium heart healthy   Complete by: As directed    Discharge instructions   Complete by: As directed    Follow with Primary MD Kathyrn Drown, MD in 3-4 days   Get CBC, CMP, Magnesium -  checked next visit within 3 -4  days by her primary care physician, get your blood pressure medications reevaluated by your PCP within the next 3 to 4 days  Activity: As tolerated with Full fall precautions use walker/cane & assistance as needed  Disposition Home    Diet: Heart Healthy    Special Instructions: If you have smoked or chewed Tobacco  in the last 2 yrs please stop smoking, stop any regular Alcohol  and or any Recreational drug use.  On your next visit with your primary care physician please Get Medicines reviewed and adjusted.  Please request your Prim.MD to go over all Hospital Tests and Procedure/Radiological results at the follow up, please get all Hospital records sent to your Prim MD by signing hospital release before you go home.  If you experience worsening of your admission symptoms, develop shortness of breath, life threatening emergency, suicidal or homicidal thoughts you must seek medical attention immediately by calling 911 or calling your MD immediately  if symptoms less severe.  You Must read complete instructions/literature along with all the possible adverse reactions/side effects for all the Medicines you take and that have been prescribed to you. Take any new Medicines after you have completely understood and  accpet all the possible adverse reactions/side effects.   Increase activity slowly   Complete by: As directed        Discharge Medications   Allergies as of 09/29/2021       Reactions   Brilinta [ticagrelor] Shortness Of Breath, Other (See Comments)   VERTIGO, also   Influenza Vaccines Other (See Comments)   Pt states he has been hospitalized both times he was given flu vaccine as a younger adult [place] in [place] and they told him not to take it again   Fluoride Preparations Other (See Comments)   Reaction not recalled        Medication List  STOP taking these medications    ferrous sulfate 325 (65 FE) MG tablet   hydrALAZINE 25 MG tablet Commonly known as: APRESOLINE   isosorbide mononitrate 30 MG 24 hr tablet Commonly known as: IMDUR   lidocaine-prilocaine cream Commonly known as: EMLA   predniSONE 20 MG tablet Commonly known as: DELTASONE   traMADol 50 MG tablet Commonly known as: ULTRAM       TAKE these medications    acetaminophen 500 MG tablet Commonly known as: TYLENOL Take 500 mg by mouth every 6 (six) hours as needed for mild pain or moderate pain.   aspirin EC 81 MG tablet Take 81 mg by mouth daily.   atorvastatin 20 MG tablet Commonly known as: LIPITOR TAKE 1 TABLET(20 MG) BY MOUTH DAILY What changed:  how much to take how to take this when to take this additional instructions   carvedilol 3.125 MG tablet Commonly known as: COREG Take 1 tablet (3.125 mg total) by mouth 2 (two) times daily with a meal. What changed:  medication strength how much to take   cephALEXin 500 MG capsule Commonly known as: KEFLEX Take 1 capsule (500 mg total) by mouth 3 (three) times daily for 10 days.   clopidogrel 75 MG tablet Commonly known as: PLAVIX Take 1 tablet (75 mg total) by mouth daily.   dapagliflozin propanediol 10 MG Tabs tablet Commonly known as: Farxiga Take 1 tablet (10 mg total) by mouth daily before breakfast.   Entresto 49-51  MG Generic drug: sacubitril-valsartan Take 1 tablet by mouth 2 (two) times daily. Resume only after you have seen your primary care physician and he has cleared you to take it. Start taking on: October 06, 2021 What changed:  additional instructions These instructions start on October 06, 2021. If you are unsure what to do until then, ask your doctor or other care provider.   furosemide 20 MG tablet Commonly known as: Lasix Take 1 tablet (20 mg total) by mouth daily. Start taking on: October 02, 2021 What changed: These instructions start on October 02, 2021. If you are unsure what to do until then, ask your doctor or other care provider.   methylcellulose 1 % ophthalmic solution Commonly known as: ARTIFICIAL TEARS Place 1 drop into both eyes daily.   nitroGLYCERIN 0.4 MG SL tablet Commonly known as: NITROSTAT Place 1 tablet (0.4 mg total) under the tongue every 5 (five) minutes as needed for chest pain.   ondansetron 8 MG tablet Commonly known as: ZOFRAN Take 1/2 tablet (4 mg total) by mouth every 8 (eight) hours as needed for nausea or vomiting (As needed starting on Day 4 after Day 1 chemo).   pantoprazole 20 MG tablet Commonly known as: PROTONIX Take 1 tablet (20 mg total) by mouth 2 (two) times daily.   prochlorperazine 10 MG tablet Commonly known as: COMPAZINE Take 1 tablet (10 mg total) by mouth every 6 (six) hours as needed for nausea or vomiting.   tamsulosin 0.4 MG Caps capsule Commonly known as: FLOMAX TAKE 1 CAPSULE(0.4 MG) BY MOUTH DAILY What changed:  how much to take how to take this when to take this additional instructions               Durable Medical Equipment  (From admission, onward)           Start     Ordered   09/29/21 1005  For home use only DME Walker rolling  Once       Comments: 5  wheel  Question Answer Comment  Walker: With [place]   Patient needs a walker to treat with the following condition Weakness      09/29/21 1005              Follow-up Information     Luking, Elayne Snare, MD. Schedule an appointment as soon as possible for a visit in 4 day(s).   Specialty: Family Medicine Why: Please get your blood pressure medications reevaluated within 3 to 4 days of discharge by your PCP Contact information: [place] [address] [phone]         Lorretta Harp, MD .   Specialties: Cardiology, Radiology Contact information: [address] [place] [address] [phone]                 Major procedures and Radiology Reports - PLEASE review detailed and final reports thoroughly  -       US RENAL  Result Date: 09/28/2021 CLINICAL DATA:  [id number]; AK I EXAM: RENAL / URINARY TRACT ULTRASOUND COMPLETE COMPARISON:  Ultrasound dated Aug 18, 2020; PET CT dated April 02, 2021 FINDINGS: Evaluation is limited secondary to suboptimal acoustic windows. Right Kidney: Renal measurements: 8.1 x 4.5 x 4.7 cm = volume: 89 mL. Echogenicity within normal limits. No mass or hydronephrosis visualized. Left Kidney: Renal measurements: 11 x 5.5 x 6.7 cm = volume: 209 mL. Echogenicity within normal limits. No mass or hydronephrosis visualized. Bladder: Completely decompressed and not visualized Other: None. IMPRESSION: No hydronephrosis. Electronically Signed   By: Valentino Saxon M.D.   On: 09/28/2021 12:53   CT CHEST WO CONTRAST  Result Date: 09/25/2021 CLINICAL DATA:  Pneumonia.  History of B-cell lymphoma. EXAM: CT CHEST WITHOUT CONTRAST TECHNIQUE: Multidetector CT imaging of the chest was performed following the standard protocol without IV contrast. RADIATION DOSE REDUCTION: This exam was performed according to the departmental dose-optimization program which includes automated exposure control, adjustment of the mA and/or kV according to patient size and/or use of iterative reconstruction technique. COMPARISON:  PET-CT 04/02/2021 FINDINGS: Cardiovascular: Heart is moderately  enlarged, unchanged. There is no pericardial effusion. Thoracic aorta is normal in size. There are severe atherosclerotic calcifications of the aorta and coronary arteries. Abdominal aortic aneurysm measuring up to 4.7 cm is incompletely imaged and unchanged. Note is made that this is incompletely evaluated on this study. Mediastinum/Nodes: There is a moderate-sized hiatal hernia similar to the prior study. Visualized thyroid gland and esophagus are within normal limits. There are no enlarged mediastinal or hilar lymph nodes. Lungs/Pleura: There are bilateral calcified pleural plaques similar to the prior study. There is minimal atelectasis in the lung bases. There is no pleural effusion or pneumothorax. Upper Abdomen: No acute findings. Musculoskeletal: No acute fractures are seen. Degenerative changes affect the shoulders. IMPRESSION: 1. No acute cardiopulmonary process identified. 2. Stable cardiomegaly. 3. Stable calcified pleural plaques. 4. Stable moderate-sized hiatal hernia. 5. 4.7 cm abdominal aortic aneurysm. Recommend follow-up every 6 months and vascular consultation. Reference: J Am Coll Radiol 3212;24:825-003. Aortic Atherosclerosis (ICD10-I70.0). Electronically Signed   By: Ronney Asters M.D.   On: 09/25/2021 20:48   DG Chest Port 1 View  Result Date: 09/25/2021 CLINICAL DATA:  Questionable sepsis - evaluate for abnormality EXAM: PORTABLE CHEST 1 VIEW COMPARISON:  Radiograph 05/27/2021 FINDINGS: Enlarged cardiac silhouette with mild pulmonary vascular congestion. Right chest port has been removed. Faint lower lung airspace opacities bilaterally. Unchanged left-sided calcified pleural plaque. Bibasilar subsegmental atelectasis. No  pleural effusion. No pneumothorax. 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 No acute osseous abnormality. Thoracic spondylosis. IMPRESSION: Enlarged cardiac silhouette with central pulmonary vascular congestion. Faint lower lung opacities which could be  atelectasis or developing infection. Electronically Signed   By: Maurine Simmering M.D.   On: 09/25/2021 17:07    Today   Subjective    [name] [name] today has no headache,no chest abdominal pain,no new weakness tingling or numbness, feels much better wants to go home today.     Objective   Blood pressure 107/83, pulse 91, temperature 97.7 F (36.5 C), temperature source Oral, resp. rate 19, height '5\' 8"'$  (1.727 m), weight 75.5 kg, SpO2 95 %.   Intake/Output Summary (Last 24 hours) at 09/29/2021 1013 Last data filed at 09/29/2021 0300 Gross per 24 hour  Intake 790.07 ml  Output 1650 ml  Net -859.93 ml    Exam  Awake Alert, No new F.N deficits,    [place].AT,PERRAL Supple Neck,   Symmetrical Chest wall movement, Good air movement bilaterally, CTAB RRR,No Gallops,   +ve B.Sounds, Abd Soft, Non tender,  No Cyanosis, Clubbing or edema    Data Review   Recent Labs  Lab 09/25/21 1657 09/26/21 0432 09/27/21 0231 09/28/21 0158 09/29/21 0016  WBC 6.8 9.9 4.8 5.5 5.7  HGB 13.5 12.1* 11.3* 10.6* 10.9*  HCT 43.2 39.2 35.4* 34.9* 35.1*  PLT 87* 73* 65* 68* 72*  MCV 78.0* 76.3* 75.0* 77.0* 76.6*  MCH 24.4* 23.5* 23.9* 23.4* 23.8*  MCHC 31.3 30.9 31.9 30.4 31.1  RDW 22.3* 21.9* 21.9* 21.9* 22.2*  LYMPHSABS 0.2* 0.2*  --   --  0.5*  MONOABS 0.1 0.7  --   --  0.7  EOSABS 0.1 0.0  --   --  0.2  BASOSABS 0.0 0.0  --   --  0.0    Recent Labs  Lab 09/25/21 1657 09/25/21 1659 09/26/21 0432 09/27/21 0231 09/28/21 0158 09/29/21 0016  NA 138  --  139 140 140 143  K 4.7  --  4.2 4.2 4.0 4.5  CL 108  --  110 109 114* 111  CO2 19*  --  20* 20* 20* 23  GLUCOSE 146*  --  117* 117* 141* 115*  BUN 40*  --  36* 49* 51* 32*  CREATININE 1.76*  --  1.58* 2.23* 2.10* 1.44*  CALCIUM 8.6*  --  8.7* 8.2* 7.8* 8.3*  AST 17  --  21  --   --   --   ALT 10  --  11  --   --   --   ALKPHOS 71  --  56  --   --   --   BILITOT 0.3  --  0.7  --   --   --   ALBUMIN 3.7  --  3.4*  --   --   --   MG  --    --  1.8 1.6*  --  2.0  LATICACIDVEN  --  1.5  --   --   --   --   INR 1.2  --   --   --   --   --   TSH  --   --  0.911  --   --   --   BNP 2,061.0*  --   --   --   --   --     Total Time in preparing paper work, data evaluation and todays exam - 35 minutes  Sally Menard  Candiss Norse M.D on 09/29/2021 at 10:13 AM  [facility]

## 2021-09-29 NOTE — Telephone Encounter (Signed)
Transition Care Management Follow-up Telephone Call Date of discharge and from where: 09/29/2021 [facility]. How have you been since you were released from the hospital? Spoke with pt's wife, Pamala Hurry and she states patient is doing better.  Any questions or concerns? No  Items Reviewed: Did the pt receive and understand the discharge instructions provided? Yes  Medications obtained and verified? Yes  Other? No  Any new allergies since your discharge? No  Dietary orders reviewed? Yes Do you have support at home? Yes   Home Care and Equipment/Supplies: Were home health services ordered? yes If so, what is the name of the agency? [facility]  Has the agency set up a time to come to the patient's home? yes Were any new equipment or medical supplies ordered?  No What is the name of the medical supply agency? N/A Were you able to get the supplies/equipment? not applicable Do you have any questions related to the use of the equipment or supplies? No  Functional Questionnaire: (I = Independent and D = Dependent) ADLs: I  Bathing/Dressing- I  Meal Prep- I  Eating- I  Maintaining continence- I  Transferring/Ambulation- I  Managing Meds- I  Follow up appointments reviewed:  PCP Hospital f/u appt confirmed? Yes  Scheduled to see [name] on 10/03/2021 @ 11:20 am. [place] [facility] f/u appt confirmed?  [name]   No follow up scheduled yet.  Are transportation arrangements needed? No  If their condition worsens, is the pt aware to call PCP or go to the Emergency Dept.? Yes Was the patient provided with contact information for the PCP's office or ED? Yes Was to pt encouraged to call back with questions or concerns? Yes

## 2021-09-29 NOTE — Progress Notes (Signed)
PT Cancellation Note  Patient Details Name: [name] [name] MRN: [id number] DOB: March 26, 1935   Cancelled Treatment:    Reason Eval/Treat Not Completed: (P) Other (comment);Patient declined, no reason specified (Pt reports he feels confident in his mobility and ability to negotiate his stairs to enter his home.  He politely declined PT at this time.)   Moriah Shawley Eli Hose 09/29/2021, 11:30 AM  Erasmo Leventhal , PTA Acute Rehabilitation Services Office [phone]

## 2021-09-29 NOTE — Discharge Instructions (Addendum)
Follow with Primary MD Kathyrn Drown, MD in 3-4 days   Get CBC, CMP, Magnesium -  checked next visit within 3 -4  days by her primary care physician, get your blood pressure medications reevaluated by your PCP within the next 3 to 4 days  Activity: As tolerated with Full fall precautions use walker/cane & assistance as needed  Disposition Home    Diet: Heart Healthy    Special Instructions: If you have smoked or chewed Tobacco  in the last 2 yrs please stop smoking, stop any regular Alcohol  and or any Recreational drug use.  On your next visit with your primary care physician please Get Medicines reviewed and adjusted.  Please request your Prim.MD to go over all Hospital Tests and Procedure/Radiological results at the follow up, please get all Hospital records sent to your Prim MD by signing hospital release before you go home.  If you experience worsening of your admission symptoms, develop shortness of breath, life threatening emergency, suicidal or homicidal thoughts you must seek medical attention immediately by calling 911 or calling your MD immediately  if symptoms less severe.  You Must read complete instructions/literature along with all the possible adverse reactions/side effects for all the Medicines you take and that have been prescribed to you. Take any new Medicines after you have completely understood and accpet all the possible adverse reactions/side effects.

## 2021-09-29 NOTE — TOC Transition Note (Signed)
Transition of Care [facility]) - CM/SW Discharge Note   Patient Details  Name: [name] [name] MRN: [id number] Date of Birth: 1935-01-14  [facility] CM/SW Contact:  Cyndi Bender, RN Phone Number: 09/29/2021, 11:18 AM   Clinical Narrative:     Patient stable for discharge. Orders for Home health PT/OT. Choice offered. Patient defers to [facility] to find highly rated agency. Spoke to Amy with [facility] and referral accepted. Patient states he has a walker. Address, Phone number and PCP verified.   Final next level of care: [place] Barriers to Discharge: Barriers Resolved   Patient Goals and CMS Choice Patient states their goals for this hospitalization and ongoing recovery are:: return home CMS Medicare.gov Compare Post Acute Care list provided to:: Patient Choice offered to / list presented to : Patient  Discharge Placement                       Discharge Plan and Services   Discharge Planning Services: CM Consult Post Acute Care Choice: Home Health                    HH Arranged: PT, OT [facility] Agency: [place] Date [place]: 09/29/21 Time [place]: 1115 Representative spoke with at [place]: [place] Interventions     Readmission Risk Interventions    09/29/2021   11:16 AM  Readmission Risk Prevention Plan  Transportation Screening Complete  PCP or Specialist Appt within 5-7 Days Complete  Home Care Screening Complete  Medication Review (RN CM) Complete

## 2021-10-03 ENCOUNTER — Encounter: Payer: Self-pay | Admitting: Family Medicine

## 2021-10-03 ENCOUNTER — Ambulatory Visit (INDEPENDENT_AMBULATORY_CARE_PROVIDER_SITE_OTHER): Payer: PPO | Admitting: Family Medicine

## 2021-10-03 VITALS — BP 125/85 | Wt 168.8 lb

## 2021-10-03 DIAGNOSIS — N289 Disorder of kidney and ureter, unspecified: Secondary | ICD-10-CM | POA: Diagnosis not present

## 2021-10-03 DIAGNOSIS — D696 Thrombocytopenia, unspecified: Secondary | ICD-10-CM | POA: Diagnosis not present

## 2021-10-03 DIAGNOSIS — N3000 Acute cystitis without hematuria: Secondary | ICD-10-CM | POA: Diagnosis not present

## 2021-10-03 DIAGNOSIS — I10 Essential (primary) hypertension: Secondary | ICD-10-CM | POA: Diagnosis not present

## 2021-10-03 NOTE — Progress Notes (Unsigned)
   Subjective:    Patient ID: [name] [name], male    DOB: [birth year], 87 y.o.   MRN: [id number]  HPI Pt arrives with wife and daughter for hospital follow up. Pt was in [facility] for 5 days due to UTI/blood infection. Pt states he "feels wonderful" today.  Hospital follow-up Transitional services  Help from his wife and daughter Able to get around with a cane Eating drinking okay Taking his medicines These were discussed in detail  Labs and tests from hospitalization were reviewed in detail  Review of Systems     Objective:   Physical Exam  Lungs are clear heart rate controlled extremities no edema blood pressure good sitting and standing abdomen soft flank nontender      Assessment & Plan:  Recent sepsis Doing well currently On antibiotics No fevers Energy level picking up appetite picking up 1. Primary hypertension Blood pressure good continue current measures follow-up with cardiology as planned within the next month - Basic metabolic panel - CBC with Differential  2. Thrombocytopenia, unspecified ([place]) With thrombocytopenia monitor closely if platelets drop below 50,000 refer back to hematology warning signs discussed regarding bleeding - Basic metabolic panel - CBC with Differential  3. Renal insufficiency Monitor closely.  Get plenty of fluids in the diet. - Basic metabolic panel - CBC with Differential  4. Acute cystitis without hematuria Continue antibiotics.

## 2021-10-04 ENCOUNTER — Encounter: Payer: Self-pay | Admitting: Oncology

## 2021-10-04 LAB — CBC WITH DIFFERENTIAL/PLATELET
Basophils Absolute: 0.1 10*3/uL (ref 0.0–0.2)
Basos: 1 %
EOS (ABSOLUTE): 0.3 10*3/uL (ref 0.0–0.4)
Eos: 4 %
Hematocrit: 37.3 % — ABNORMAL LOW (ref 37.5–51.0)
Hemoglobin: 11.6 g/dL — ABNORMAL LOW (ref 13.0–17.7)
Immature Grans (Abs): 0.1 10*3/uL (ref 0.0–0.1)
Immature Granulocytes: 1 %
Lymphocytes Absolute: 0.9 10*3/uL (ref 0.7–3.1)
Lymphs: 11 %
MCH: 23.7 pg — ABNORMAL LOW (ref 26.6–33.0)
MCHC: 31.1 g/dL — ABNORMAL LOW (ref 31.5–35.7)
MCV: 76 fL — ABNORMAL LOW (ref 79–97)
Monocytes Absolute: 0.8 10*3/uL (ref 0.1–0.9)
Monocytes: 10 %
Neutrophils Absolute: 5.6 10*3/uL (ref 1.4–7.0)
Neutrophils: 73 %
Platelets: 133 10*3/uL — ABNORMAL LOW (ref 150–450)
RBC: 4.9 x10E6/uL (ref 4.14–5.80)
RDW: 20.6 % — ABNORMAL HIGH (ref 11.6–15.4)
WBC: 7.8 10*3/uL (ref 3.4–10.8)

## 2021-10-04 LAB — BASIC METABOLIC PANEL
BUN/Creatinine Ratio: 19 (ref 10–24)
BUN: 24 mg/dL (ref 8–27)
CO2: 20 mmol/L (ref 20–29)
Calcium: 8.8 mg/dL (ref 8.6–10.2)
Chloride: 107 mmol/L — ABNORMAL HIGH (ref 96–106)
Creatinine, Ser: 1.24 mg/dL (ref 0.76–1.27)
Glucose: 87 mg/dL (ref 70–99)
Potassium: 5.3 mmol/L — ABNORMAL HIGH (ref 3.5–5.2)
Sodium: 143 mmol/L (ref 134–144)
eGFR: 57 mL/min/{1.73_m2} — ABNORMAL LOW (ref >59)

## 2021-10-10 ENCOUNTER — Ambulatory Visit (HOSPITAL_COMMUNITY)
Admission: RE | Admit: 2021-10-10 | Discharge: 2021-10-10 | Disposition: A | Discharge status: Home / Self Care | Payer: PPO | Source: Ambulatory Visit | Attending: Cardiovascular Disease | Admitting: Cardiovascular Disease

## 2021-10-10 ENCOUNTER — Other Ambulatory Visit (HOSPITAL_COMMUNITY): Payer: Self-pay | Admitting: Cardiovascular Disease

## 2021-10-10 ENCOUNTER — Ambulatory Visit (HOSPITAL_BASED_OUTPATIENT_CLINIC_OR_DEPARTMENT_OTHER)
Admission: RE | Admit: 2021-10-10 | Discharge: 2021-10-10 | Disposition: A | Discharge status: Home / Self Care | Payer: PPO | Source: Ambulatory Visit | Attending: Cardiovascular Disease | Admitting: Cardiovascular Disease

## 2021-10-10 DIAGNOSIS — I739 Peripheral vascular disease, unspecified: Secondary | ICD-10-CM | POA: Diagnosis not present

## 2021-10-10 DIAGNOSIS — Z95828 Presence of other vascular implants and grafts: Secondary | ICD-10-CM | POA: Diagnosis not present

## 2021-10-10 DIAGNOSIS — I6522 Occlusion and stenosis of left carotid artery: Secondary | ICD-10-CM | POA: Diagnosis not present

## 2021-10-10 DIAGNOSIS — Z9889 Other specified postprocedural states: Secondary | ICD-10-CM | POA: Insufficient documentation

## 2021-10-10 DIAGNOSIS — Z95820 Peripheral vascular angioplasty status with implants and grafts: Secondary | ICD-10-CM | POA: Diagnosis not present

## 2021-10-13 ENCOUNTER — Other Ambulatory Visit: Payer: Self-pay

## 2021-10-13 DIAGNOSIS — Z95828 Presence of other vascular implants and grafts: Secondary | ICD-10-CM

## 2021-10-13 DIAGNOSIS — I779 Disorder of arteries and arterioles, unspecified: Secondary | ICD-10-CM

## 2021-10-20 ENCOUNTER — Other Ambulatory Visit: Payer: Self-pay

## 2021-10-27 ENCOUNTER — Ambulatory Visit (INDEPENDENT_AMBULATORY_CARE_PROVIDER_SITE_OTHER): Payer: PPO | Admitting: Family Medicine

## 2021-10-27 ENCOUNTER — Encounter: Payer: Self-pay | Admitting: Family Medicine

## 2021-10-27 VITALS — BP 150/94 | HR 69 | Temp 98.1°F | Wt 164.0 lb

## 2021-10-27 DIAGNOSIS — N3000 Acute cystitis without hematuria: Secondary | ICD-10-CM

## 2021-10-27 LAB — POCT URINALYSIS DIP (CLINITEK)
Spec Grav, UA: >=1.03 — AB (ref 1.010–1.025)
pH, UA: 5 (ref 5.0–8.0)

## 2021-10-27 NOTE — Progress Notes (Signed)
   Subjective:    Patient ID: [name] [name], male    DOB: 29-Mar-1935, 87 y.o.   MRN: [id number]  HPI Pt arrives for follow up. Pt states no issues at this time. Pt was in for office visit on 10/03/21. Patient with a recent urosepsis Here today for follow-up UA Denies dysuria urinary frequency or swelling Results for orders placed or performed in visit on 10/27/21  POCT URINALYSIS DIP (CLINITEK)  Result Value Ref Range   Color, UA     Clarity, UA     Glucose, UA     Bilirubin, UA     Ketones, POC UA     Spec Grav, UA >=1.030 (A) 1.010 - 1.025   Blood, UA     pH, UA 5.0 5.0 - 8.0   POC PROTEIN,UA trace negative, trace   Urobilinogen, UA     Nitrite, UA     Leukocytes, UA       Review of Systems     Objective:   Physical Exam General-in no acute distress Eyes-no discharge Lungs-respiratory rate normal, CTA CV-no murmurs Extremities skin warm dry no edema Neuro grossly normal Behavior normal, alert        Assessment & Plan:  Recent urosepsis Urinary symptoms doing better No UTI noted

## 2021-10-28 ENCOUNTER — Encounter: Payer: Self-pay | Admitting: Cardiovascular Disease

## 2021-10-28 ENCOUNTER — Ambulatory Visit: Payer: PPO | Admitting: Cardiovascular Disease

## 2021-10-28 ENCOUNTER — Other Ambulatory Visit: Payer: Self-pay | Admitting: Family Medicine

## 2021-10-28 ENCOUNTER — Telehealth: Payer: Self-pay

## 2021-10-28 ENCOUNTER — Other Ambulatory Visit: Payer: Self-pay | Admitting: Physician Assistant

## 2021-10-28 ENCOUNTER — Other Ambulatory Visit: Payer: Self-pay

## 2021-10-28 DIAGNOSIS — I10 Essential (primary) hypertension: Secondary | ICD-10-CM

## 2021-10-28 DIAGNOSIS — E782 Mixed hyperlipidemia: Secondary | ICD-10-CM

## 2021-10-28 DIAGNOSIS — I7143 Infrarenal abdominal aortic aneurysm, without rupture: Secondary | ICD-10-CM

## 2021-10-28 DIAGNOSIS — I2102 ST elevation (STEMI) myocardial infarction involving left anterior descending coronary artery: Secondary | ICD-10-CM

## 2021-10-28 DIAGNOSIS — I739 Peripheral vascular disease, unspecified: Secondary | ICD-10-CM | POA: Diagnosis not present

## 2021-10-28 MED ORDER — CARVEDILOL 6.25 MG PO TABS: 6.2500 mg | ORAL_TABLET | Freq: Two times a day (BID) | ORAL | 3 refills | Status: DC

## 2021-10-28 NOTE — Assessment & Plan Note (Signed)
History of anterior STEMI/5/15.  I performed emergency left heart cath with successful aspiration thrombectomy, PCI and drug-eluting stenting of the proximal LAD.  His EF at that time was 50%.

## 2021-10-28 NOTE — Progress Notes (Signed)
10/28/2021 [name] [name]   November 14, 1934  [id number]  Primary Physician Kathyrn Drown, MD Primary Cardiologist: Lorretta Harp MD [name], [place], [name]  HPI:  [name] [name] is a 87 y.o.   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 I last saw him in the office 11/27/2020.  He is accompanied by grandson [name] [name] today.[name] He was admitted on 07/02/13 for Anterior STEMI. Presenting symptoms included severe epigastric burning pain/ indigestion and left arm pain. Initial troponin was > 20.0. He underwent emergent LHC and successful aspiration thrombectomy, PCI and stenting of proximal LAD by [name], via the right femoral artery . The overall LVEF was estimated at 50%, without wall motion abnormalities. He tolerated the procedure well and left the cath lab in stable condition. DAPT with ASA and Brilinta was initiated. He was also placed on a BB, ACE-I and statin. His post PCI course was w/o complication. He was discharged home on 07/05/13.   It should also be noted that at time of emergent LHC, he was found to also have significant peripheral vascular disease with an occluded left iliac and high-grade right common iliac artery stenosis, as well as a small AAA. The patient complained of bilateral LEE. [name] ordered for him to undergo bilateral LEAs. The study was performed in our office on 07/18/13. Findings are as follows: ABI 0.95 on Rt, 0.59 on Lt.the patient underwent angiography, diamondback orbital rotational arthrectomy, PTA and stenting of a highly calcified ostial right common iliac artery stenosis. He had excellent angiographic and clinical result. His Dopplers normalized to no longer has right lower extremity claudication. He wishes to proceed with attempt at percutaneous opacification of his left common iliac chronic total occlusion. He is not tolerating his Brilenta because of shortness of breath and we  subsequently transitioned to Plavix. He had a left brain stroke thought to be related to a watershed infarct from a hemodynamically significant right carotid stenosis in the setting of an occluded left carotid. He was evaluated by Dr. Erlinda Hong from [facility]. Dr. Erlinda Hong thought he would be a candidate for carotid stenting should he be found have a right internal carotid artery stenosis of greater than 70%.I intubated him on 05/15/14 revealing at most a 40-50% smooth right internal artery stenosis. I suspect his carotid ultrasound overestimated the degree of stenosis because the contralateral occlusion.  Since I saw him a year ago he's remained stable. He denies chest pain, shortness of breath or claudication. He does want to have a left total knee replacement. A pharmacologic Myoview stress test was intermediate risk with scar and peri-infarct ischemia. His EF is in the 45-55% range. Because of progressively worsening Doppler studies a CT scan scan was performed that showed progression of his right internal carotid artery stenosis compared to his prior CTA. He did have some right common carotid origin stenosis as well. He was neurologically asymptomatic. I did angiogram him approximately 18 months ago revealing at most 50-60% right ICA stenosis. I suspected that the Dopplers overestimated the degree of stenosis because of contralateral occlusion. Because of progression of disease he underwent angiography by myself 10/29/15 with the intent to perform right internal carotid artery stenting. Unfortunately, because of the degree of calcification and tortuosity and was never able to safely access the right common carotid artery and therefore aborted the procedure. The patient also underwent uncomplicated right carotid endarterectomy by Dr.  Brabham 11/27/15 which he has nicely recovered from. Recent carotid Dopplers performed 01/15/16 revealed a widely patent right carotid endarterectomy site.   He was admitted  for 2 days in November with acute on chronic diastolic heart failure and was diuresed.  He was hypertensive and had chest pain at that time but MI was ruled out.    Since I saw him in the office 1 year ago he was diagnosed with B-cell lymphoma which she was treated for and is in remission.  He was admitted earlier last month with sepsis.  He was found to have moderate LV dysfunction with an EF in the 25-30 % range which is new for him and was placed on guideline directed optimal medical therapy as well as as needed diuretics.  He feels clinically improved.  The etiology of his LV dysfunction was not determined.     Current Meds  Medication Sig   acetaminophen (TYLENOL) 500 MG tablet Take 500 mg by mouth every 6 (six) hours as needed for mild pain or moderate pain.    atorvastatin (LIPITOR) 20 MG tablet TAKE 1 TABLET(20 MG) BY MOUTH DAILY (Patient taking differently: Take 20 mg by mouth daily.)   carvedilol (COREG) 3.125 MG tablet Take 1 tablet (3.125 mg total) by mouth 2 (two) times daily with a meal.   clopidogrel (PLAVIX) 75 MG tablet Take 1 tablet (75 mg total) by mouth daily.   furosemide (LASIX) 20 MG tablet Take 1 tablet (20 mg total) by mouth daily.   methylcellulose (ARTIFICIAL TEARS) 1 % ophthalmic solution Place 1 drop into both eyes daily.   nitroGLYCERIN (NITROSTAT) 0.4 MG SL tablet Place 1 tablet (0.4 mg total) under the tongue every 5 (five) minutes as needed for chest pain.   pantoprazole (PROTONIX) 20 MG tablet TAKE 1 TABLET BY MOUTH TWICE A DAY   sacubitril-valsartan (ENTRESTO) 49-51 MG Take 1 tablet by mouth 2 (two) times daily. Resume only after you have seen your primary care physician and he has cleared you to take it.   tamsulosin (FLOMAX) 0.4 MG CAPS capsule TAKE 1 CAPSULE(0.4 MG) BY MOUTH DAILY (Patient taking differently: Take 0.4 mg by mouth daily.)     Allergies  Allergen Reactions   Brilinta [Ticagrelor] Shortness Of Breath and Other (See Comments)    VERTIGO, also    Influenza Vaccines Other (See Comments)    Pt states he has been hospitalized both times he was given flu vaccine as a younger adult while in the [employer] and they told him not to take it again   Fluoride Preparations Other (See Comments)    Reaction not recalled    Social History   Socioeconomic History   Marital status: Married    Spouse name: Not on file   Number of children: 3   Years of education: ASSOCIATES   Highest education level: Not on file  Occupational History   Occupation: Retired  Tobacco Use   Smoking status: Former    Packs/day: 0.50    Years: 7.00    Total pack years: 3.50    Types: Cigarettes   Smokeless tobacco: Never  Substance and Sexual Activity   Alcohol use: No    Alcohol/week: 0.0 standard drinks of alcohol   Drug use: No   Sexual activity: Not Currently  Other Topics Concern   Not on file  Social History Narrative   Patient is married with 3 children.   Patient is right handed.   Patient has his Associates degree.  Patient drinks 2-3 cups daily.   Social Determinants of Health   Financial Resource Strain: Low Risk  (12/07/2020)   Overall Financial Resource Strain (CARDIA)    Difficulty of Paying Living Expenses: Not hard at all  Food Insecurity: No Food Insecurity (12/07/2020)   Hunger Vital Sign    Worried About Running Out of Food in the Last Year: Never true    Ran Out of Food in the Last Year: Never true  Transportation Needs: No Transportation Needs (12/07/2020)   PRAPARE - Hydrologist (Medical): No    Lack of Transportation (Non-Medical): No  Physical Activity: Inactive (12/07/2020)   Exercise Vital Sign    Days of Exercise per Week: 0 days    Minutes of Exercise per Session: 20 min  Stress: No Stress Concern Present (12/07/2020)   [place]    Feeling of Stress : Not at all  Social Connections: [place] (12/07/2020)   Social  Connection and Isolation Panel [NHANES]    Frequency of Communication with Friends and Family: Twice a week    Frequency of Social Gatherings with Friends and Family: Twice a week    Attends Religious Services: 1 to 4 times per year    Active Member of [employer] or Organizations: No    Attends Music therapist: 1 to 4 times per year    Marital Status: Married  Human resources officer Violence: Not At Risk (12/07/2020)   Humiliation, Afraid, Rape, and Kick questionnaire    Fear of Current or Ex-Partner: No    Emotionally Abused: No    Physically Abused: No    Sexually Abused: No     Review of Systems: General: negative for chills, fever, night sweats or weight changes.  Cardiovascular: negative for chest pain, dyspnea on exertion, edema, orthopnea, palpitations, paroxysmal nocturnal dyspnea or shortness of breath Dermatological: negative for rash Respiratory: negative for cough or wheezing Urologic: negative for hematuria Abdominal: negative for nausea, vomiting, diarrhea, bright red blood per rectum, melena, or hematemesis Neurologic: negative for visual changes, syncope, or dizziness All other systems reviewed and are otherwise negative except as noted above.    Blood pressure (!) 160/88, pulse 61, height '5\' 8"'$  (1.727 m), weight 164 lb 12.8 oz (74.8 kg), SpO2 98 %.  General appearance: alert and no distress Neck: no adenopathy, no carotid bruit, no JVD, supple, symmetrical, trachea midline, and thyroid not enlarged, symmetric, no tenderness/mass/nodules Lungs: clear to auscultation bilaterally Heart: regular rate and rhythm, S1, S2 normal, no murmur, click, rub or gallop Extremities: extremities normal, atraumatic, no cyanosis or edema Pulses: 2+ and symmetric Skin: Skin color, texture, turgor normal. No rashes or lesions Neurologic: Grossly normal  EKG not performed today  ASSESSMENT AND PLAN:   Hyperlipidemia History of hyperlipidemia on statin therapy with lipid profile  performed 05/05/2021 revealing total cholesterol 91, LDL of 35 and HDL 40.  Hypertension History of essential hypertension a blood pressure measured today at 160/88.  He is on carvedilol and Entresto.  I am going to increase his carvedilol from 3.125 mg twice daily to 6.25 mg p.o. twice daily.  Occlusion and stenosis of carotid artery with cerebral infarction History of known left ICA occlusion status post right carotid endarterectomy by Dr. Trula Slade after failed attempt at stenting 11/27/2015.  His most recent Doppler studies performed 10/10/2021 revealed a widely patent endarterectomy site.  This will be repeated on an annual basis.  STEMI (ST elevation  myocardial infarction) ([place]) History of anterior STEMI/5/15.  I performed emergency left heart cath with successful aspiration thrombectomy, PCI and drug-eluting stenting of the proximal LAD.  His EF at that time was 50%.  Peripheral vascular disease ([place]) History of peripheral arterial disease status post bilateral common iliac interventions by myself remotely.  He denies claudication.  His most recent Doppler studies performed 10/10/2021 revealed patent iliac stents.  AAA (abdominal aortic aneurysm) ([place]) History of abdominal aortic aneurysm measuring 4.5 cm by duplex ultrasound 10/10/2021.  This will be rechecked on annual basis.     Lorretta Harp MD FACP,FACC,FAHA, [facility] 10/28/2021 11:10 AM

## 2021-10-28 NOTE — Telephone Encounter (Signed)
Encourage patient to contact the pharmacy for refills or they can request refills through [facility]  (Please schedule appointment if patient has not been seen in over a year)    WHAT PHARMACY WOULD THEY LIKE THIS SENT TO: [facility] (ENTRESTO) 49-51 MG   NOTES/COMMENTS FROM PATIENT:Pt is out of medication I have called and spoke with pharmacy they have not received mediation that was sent in by Dr Candiss Norse on 07/10 and pt wants a 90 days supply sent to [place] office please notify patient: It takes 48-72 hours to process rx refill requests Ask patient to call pharmacy to ensure rx is ready before heading there.

## 2021-10-28 NOTE — Assessment & Plan Note (Signed)
History of hyperlipidemia on statin therapy with lipid profile performed 05/05/2021 revealing total cholesterol 91, LDL of 35 and HDL 40.

## 2021-10-28 NOTE — Assessment & Plan Note (Signed)
History of essential hypertension a blood pressure measured today at 160/88.  He is on carvedilol and Entresto.  I am going to increase his carvedilol from 3.125 mg twice daily to 6.25 mg p.o. twice daily.

## 2021-10-28 NOTE — Assessment & Plan Note (Signed)
History of abdominal aortic aneurysm measuring 4.5 cm by duplex ultrasound 10/10/2021.  This will be rechecked on annual basis.

## 2021-10-28 NOTE — Assessment & Plan Note (Signed)
History of known left ICA occlusion status post right carotid endarterectomy by Dr. Trula Slade after failed attempt at stenting 11/27/2015.  His most recent Doppler studies performed 10/10/2021 revealed a widely patent endarterectomy site.  This will be repeated on an annual basis.

## 2021-10-28 NOTE — Telephone Encounter (Signed)
The Entresto 49-'51mg'$  was refilled by Dr Gwenlyn Found this afternoon- confirmed by pharmacy- wife(DPR) notified.

## 2021-10-28 NOTE — Patient Instructions (Signed)
Medication Instructions:  INCREASE CARVEDILOL TO 6.'25mg'$  TWICE DAILY  *If you need a refill on your cardiac medications before your next appointment, please call your pharmacy*  Lab Work: None Ordered At This Time.  If you have labs (blood work) drawn today and your tests are completely normal, you will receive your results only by: [place] (if you have MyChart) OR A paper copy in the mail If you have any lab test that is abnormal or we need to change your treatment, we will call you to review the results.  Testing/Procedures: Your physician has requested that you have an ankle brachial index (ABI) JULY 2024. During this test an ultrasound and blood pressure cuff are used to evaluate the arteries that supply the arms and legs with blood. Allow thirty minutes for this exam. There are no restrictions or special instructions. This will take place at [place], [address].   Your physician has requested that you have an Aorta/Iliac Duplex JULY OF 2024. This will be take place at [place], [address].   No food after 11PM the night before.  Water is OK. (Don't drink liquids if you have been instructed not to for ANOTHER test) Avoid foods that produce bowel gas, for 24 hours prior to exam (see below). No breakfast, no chewing gum, no smoking or carbonated beverages. Patient may take morning medications with water. Come in for test at least 15 minutes early to register.  Your physician has requested that you have a carotid duplex JULY 2024. This test is an ultrasound of the carotid arteries in your neck. It looks at blood flow through these arteries that supply the brain with blood. Allow one hour for this exam. There are no restrictions or special instructions.  Follow-Up: At [facility], you and your health needs are our priority.  As part of our continuing mission to provide you with exceptional heart care, we have created designated Provider Care Teams.  These Care  Teams include your primary Cardiologist (physician) and Advanced Practice Providers (APPs -  Physician Assistants and Nurse Practitioners) who all work together to provide you with the care you need, when you need it.  We recommend signing up for the patient portal called "MyChart".  Sign up information is provided on this After Visit Summary.  MyChart is used to connect with patients for Virtual Visits (Telemedicine).  Patients are able to view lab/test results, encounter notes, upcoming appointments, etc.  Non-urgent messages can be sent to your provider as well.   To learn more about what you can do with MyChart, go to [email or web address].    Your next appointment:   3 month(s) WITH ANGIE DUKE PA-C  AND THEN   6 MONTH FOLLOW UP   The format for your next appointment:   In Person  Provider:   Quay Burow, MD

## 2021-10-28 NOTE — Assessment & Plan Note (Signed)
History of peripheral arterial disease status post bilateral common iliac interventions by myself remotely.  He denies claudication.  His most recent Doppler studies performed 10/10/2021 revealed patent iliac stents.

## 2021-10-29 ENCOUNTER — Other Ambulatory Visit: Payer: Self-pay

## 2021-10-30 ENCOUNTER — Other Ambulatory Visit: Payer: Self-pay

## 2021-11-08 ENCOUNTER — Other Ambulatory Visit: Payer: Self-pay | Admitting: Family Medicine

## 2021-11-26 ENCOUNTER — Other Ambulatory Visit: Payer: Self-pay | Admitting: Family Medicine

## 2021-11-28 ENCOUNTER — Other Ambulatory Visit: Payer: Self-pay | Admitting: Family Medicine

## 2021-11-28 ENCOUNTER — Ambulatory Visit (INDEPENDENT_AMBULATORY_CARE_PROVIDER_SITE_OTHER): Payer: PPO

## 2021-11-28 DIAGNOSIS — I2510 Atherosclerotic heart disease of native coronary artery without angina pectoris: Secondary | ICD-10-CM

## 2021-11-28 DIAGNOSIS — I5042 Chronic combined systolic (congestive) and diastolic (congestive) heart failure: Secondary | ICD-10-CM | POA: Diagnosis not present

## 2021-11-28 DIAGNOSIS — E785 Hyperlipidemia, unspecified: Secondary | ICD-10-CM

## 2021-11-28 DIAGNOSIS — I10 Essential (primary) hypertension: Secondary | ICD-10-CM

## 2021-11-28 DIAGNOSIS — Z9861 Coronary angioplasty status: Secondary | ICD-10-CM

## 2021-11-28 LAB — ECHOCARDIOGRAM COMPLETE
AR max vel: 2.51 cm2
AV Area VTI: 2.69 cm2
AV Area mean vel: 2.38 cm2
AV Mean grad: 2 mmHg
AV Peak grad: 4.7 mmHg
AV Vena cont: 0.18 cm
Ao pk vel: 1.08 m/s
Area-P 1/2: 3.4 cm2
Calc EF: 40.7 %
P 1/2 time: 566 msec
S' Lateral: 3.83 cm
Single Plane A2C EF: 41.5 %
Single Plane A4C EF: 42.4 %

## 2021-11-28 MED ORDER — TRAMADOL HCL 50 MG PO TABS: 50.0000 mg | ORAL_TABLET | Freq: Three times a day (TID) | ORAL | 2 refills | Status: DC | PRN

## 2021-11-28 NOTE — Progress Notes (Signed)
Prescription sent electronically

## 2021-11-28 NOTE — Telephone Encounter (Signed)
Patient needs refill on tramadol 50 mg completely out. [facility]

## 2021-12-19 ENCOUNTER — Inpatient Hospital Stay: Payer: PPO | Admitting: Oncology

## 2021-12-19 ENCOUNTER — Inpatient Hospital Stay: Payer: PPO | Attending: Oncology

## 2021-12-19 VITALS — BP 138/88 | HR 73 | Temp 98.2°F | Resp 18 | Ht 68.0 in | Wt 163.8 lb

## 2021-12-19 DIAGNOSIS — I739 Peripheral vascular disease, unspecified: Secondary | ICD-10-CM | POA: Diagnosis not present

## 2021-12-19 DIAGNOSIS — C8510 Unspecified B-cell lymphoma, unspecified site: Secondary | ICD-10-CM | POA: Diagnosis not present

## 2021-12-19 DIAGNOSIS — I2510 Atherosclerotic heart disease of native coronary artery without angina pectoris: Secondary | ICD-10-CM | POA: Insufficient documentation

## 2021-12-19 DIAGNOSIS — D509 Iron deficiency anemia, unspecified: Secondary | ICD-10-CM | POA: Insufficient documentation

## 2021-12-19 DIAGNOSIS — G629 Polyneuropathy, unspecified: Secondary | ICD-10-CM | POA: Diagnosis not present

## 2021-12-19 DIAGNOSIS — I110 Hypertensive heart disease with heart failure: Secondary | ICD-10-CM | POA: Diagnosis not present

## 2021-12-19 DIAGNOSIS — I509 Heart failure, unspecified: Secondary | ICD-10-CM | POA: Diagnosis not present

## 2021-12-19 DIAGNOSIS — C8338 Diffuse large B-cell lymphoma, lymph nodes of multiple sites: Secondary | ICD-10-CM | POA: Insufficient documentation

## 2021-12-19 DIAGNOSIS — D696 Thrombocytopenia, unspecified: Secondary | ICD-10-CM | POA: Diagnosis not present

## 2021-12-19 LAB — CMP (CANCER CENTER ONLY)
ALT: 5 U/L (ref 0–44)
AST: 12 U/L — ABNORMAL LOW (ref 15–41)
Albumin: 4.1 g/dL (ref 3.5–5.0)
Alkaline Phosphatase: 58 U/L (ref 38–126)
Anion gap: 10 (ref 5–15)
BUN: 39 mg/dL — ABNORMAL HIGH (ref 8–23)
CO2: 21 mmol/L — ABNORMAL LOW (ref 22–32)
Calcium: 8.6 mg/dL — ABNORMAL LOW (ref 8.9–10.3)
Chloride: 104 mmol/L (ref 98–111)
Creatinine: 1.75 mg/dL — ABNORMAL HIGH (ref 0.61–1.24)
GFR, Estimated: 38 mL/min — ABNORMAL LOW (ref >60)
Glucose, Bld: 109 mg/dL — ABNORMAL HIGH (ref 70–99)
Potassium: 4.2 mmol/L (ref 3.5–5.1)
Sodium: 135 mmol/L (ref 135–145)
Total Bilirubin: 0.4 mg/dL (ref 0.3–1.2)
Total Protein: 6.2 g/dL — ABNORMAL LOW (ref 6.5–8.1)

## 2021-12-19 LAB — CBC WITH DIFFERENTIAL (CANCER CENTER ONLY)
Abs Immature Granulocytes: 0.04 10*3/uL (ref 0.00–0.07)
Basophils Absolute: 0 10*3/uL (ref 0.0–0.1)
Basophils Relative: 1 %
Eosinophils Absolute: 0.3 10*3/uL (ref 0.0–0.5)
Eosinophils Relative: 5 %
HCT: 38.7 % — ABNORMAL LOW (ref 39.0–52.0)
Hemoglobin: 12.5 g/dL — ABNORMAL LOW (ref 13.0–17.0)
Immature Granulocytes: 1 %
Lymphocytes Relative: 15 %
Lymphs Abs: 0.8 10*3/uL (ref 0.7–4.0)
MCH: 26.9 pg (ref 26.0–34.0)
MCHC: 32.3 g/dL (ref 30.0–36.0)
MCV: 83.4 fL (ref 80.0–100.0)
Monocytes Absolute: 0.6 10*3/uL (ref 0.1–1.0)
Monocytes Relative: 11 %
Neutro Abs: 3.6 10*3/uL (ref 1.7–7.7)
Neutrophils Relative %: 67 %
Platelet Count: 83 10*3/uL — ABNORMAL LOW (ref 150–400)
RBC: 4.64 MIL/uL (ref 4.22–5.81)
RDW: 17.1 % — ABNORMAL HIGH (ref 11.5–15.5)
WBC Count: 5.3 10*3/uL (ref 4.0–10.5)
nRBC: 0 % (ref 0.0–0.2)

## 2021-12-19 LAB — LACTATE DEHYDROGENASE: LDH: 215 U/L — ABNORMAL HIGH (ref 98–192)

## 2021-12-19 NOTE — Progress Notes (Signed)
  [place] OFFICE PROGRESS NOTE   Diagnosis: Non-Hodgkin lymphoma  INTERVAL HISTORY:   Mr. Lall returns as scheduled.  He feels well.  Good appetite.  No bleeding.  No new complaint.  Objective:  Vital signs in last 24 hours:  Blood pressure 138/88, pulse 73, temperature 98.2 F (36.8 C), temperature source Oral, resp. rate 18, height '5\' 8"'$  (1.727 m), weight 163 lb 12.8 oz (74.3 kg), SpO2 100 %.    Lymphatics: No cervical, supraclavicular, axillary, or inguinal nodes Resp: End inspiratory rhonchi at the left posterior chest, no respiratory distress Cardio: Regular rate and rhythm GI: Nontender, no hepatosplenomegaly, no mass, mid upper abdominal bruit? Vascular: No leg edema  Lab Results:  Lab Results  Component Value Date   WBC 5.3 12/19/2021   HGB 12.5 (L) 12/19/2021   HCT 38.7 (L) 12/19/2021   MCV 83.4 12/19/2021   PLT 83 (L) 12/19/2021   NEUTROABS 3.6 12/19/2021    CMP  Lab Results  Component Value Date   NA 135 12/19/2021   K 4.2 12/19/2021   CL 104 12/19/2021   CO2 21 (L) 12/19/2021   GLUCOSE 109 (H) 12/19/2021   BUN 39 (H) 12/19/2021   CREATININE 1.75 (H) 12/19/2021   CALCIUM 8.6 (L) 12/19/2021   PROT 6.2 (L) 12/19/2021   ALBUMIN 4.1 12/19/2021   AST 12 (L) 12/19/2021   ALT 5 12/19/2021   ALKPHOS 58 12/19/2021   BILITOT 0.4 12/19/2021   GFRNONAA 38 (L) 12/19/2021   GFRAA 59 (L) 03/26/2020    Lab Results  Component Value Date   CEA 0.9 11/20/2020    Medications: I have reviewed the patient's current medications.   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 CT abdomen/pelvis 11/18/2020-2.4 x 2.3 segment 7 lesion, multiple additional subcentimeter lesions, some but not all present in 2016 CT chest 11/23/2020-11 mm right retrocrural node, asbestosis related pleural disease CT-guided biopsy of the left periaortic lymph node 12/12/2020-large B-cell lymphoma, CD5 positive, CD20 positive, FISH  panel-BCL6 gene rearrangement positive, MYC negative PET 01/01/2021-hypermetabolic left supraclavicular and left upper mediastinal nodes, bulky hypermetabolic periaortic retroperitoneal nodes, single hypermetabolic metastasis in the right liver, multiple hypermetabolic spleen lesions Cycle 1 CEOP-Rituxan 01/14/2021 Cycle 2 CEOP-Rituxan 02/04/2021 Cycle 3 CEOP-Rituxan 02/25/2021 Cycle 4 CEOP-Rituxan 03/18/2021 PET 04/02/2021-resolution of lymphadenopathy in the left neck, supraclavicular region, left mediastinum.  Resolution of hypermetabolic abdominal lymphadenopathy and hypermetabolic disease posterior to the liver and spleen, no new or progressive hypermetabolism, stable 4 cm abdominal aortic aneurysm Cycle 5 CEOP-Rituxan 04/08/2021 Cycle 6 CEOP-Rituxan 04/29/2021 Iron deficiency anemia Coronary artery disease Peripheral vascular disease CHF Hypertension Carotid artery disease CVA Peripheral neuropathy Thrombocytopenia      Disposition: Mr. Schara is in clinical remission from non-Hodgkin's lymphoma.  The persistent thrombocytopenia is likely related to the course of treatment for lymphoma and he has a history of chronic mild thrombocytopenia.  He will call for bleeding.  He will return for an office and lab visit in 3 months. His daughter reports he is followed by cardiology for an abdominal aortic aneurysm.  Betsy Coder, MD  12/19/2021  10:58 AM

## 2021-12-26 ENCOUNTER — Telehealth: Payer: Self-pay | Admitting: Family Medicine

## 2021-12-26 ENCOUNTER — Other Ambulatory Visit: Payer: Self-pay | Admitting: Cardiovascular Disease

## 2021-12-26 ENCOUNTER — Other Ambulatory Visit: Payer: Self-pay | Admitting: Family Medicine

## 2021-12-26 NOTE — Telephone Encounter (Signed)
Spoke with wife to schedule AWV and she asked for help getting her husband an appointment with Dr Gwenlyn Found at the [facility] location.   She said she has tried numerous times to call but has never been able to get her husband appointment scheduled for various reasons when she calls the main number.   She is very concerned due to wanting to know results from testing

## 2021-12-29 ENCOUNTER — Telehealth: Payer: Self-pay | Admitting: Cardiovascular Disease

## 2021-12-29 MED ORDER — ENTRESTO 49-51 MG PO TABS: 1.0000 | ORAL_TABLET | Freq: Two times a day (BID) | ORAL | 3 refills | Status: DC

## 2021-12-29 NOTE — Telephone Encounter (Signed)
Pt c/o medication issue:  1. Name of Medication: .  sacubitril-valsartan (ENTRESTO) 49-51 MG  2. How are you currently taking this medication (dosage and times per day)?   1 tablet Twice daily  3. Are you having a reaction (difficulty breathing--STAT)?  no  4. What is your medication issue?    Patient's wife is requesting to get a prescription for a 3 months supply because it would be cheaper for them

## 2021-12-29 NOTE — Telephone Encounter (Signed)
Pt's wife called requesting 90 day supply for Entresto due to cost  New Rx sent as requested

## 2022-01-07 ENCOUNTER — Ambulatory Visit (INDEPENDENT_AMBULATORY_CARE_PROVIDER_SITE_OTHER): Payer: PPO

## 2022-01-07 VITALS — Ht 68.0 in | Wt 163.0 lb

## 2022-01-07 DIAGNOSIS — Z0000 Encounter for general adult medical examination without abnormal findings: Secondary | ICD-10-CM | POA: Diagnosis not present

## 2022-01-07 DIAGNOSIS — I10 Essential (primary) hypertension: Secondary | ICD-10-CM | POA: Insufficient documentation

## 2022-01-07 DIAGNOSIS — Z955 Presence of coronary angioplasty implant and graft: Secondary | ICD-10-CM | POA: Insufficient documentation

## 2022-01-07 DIAGNOSIS — H9190 Unspecified hearing loss, unspecified ear: Secondary | ICD-10-CM | POA: Insufficient documentation

## 2022-01-07 DIAGNOSIS — M25569 Pain in unspecified knee: Secondary | ICD-10-CM | POA: Insufficient documentation
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
## 2022-01-07 NOTE — Patient Instructions (Signed)
[name] [name] , Thank you for taking time to come for your Medicare Wellness Visit. I appreciate your ongoing commitment to your health goals. Please review the following plan we discussed and let me know if I can assist you in the future.   Screening recommendations/referrals: Colonoscopy: aged out Recommended yearly ophthalmology/optometry visit for glaucoma screening and checkup Recommended yearly dental visit for hygiene and checkup  Vaccinations: Influenza vaccine: n/d Pneumococcal vaccine: 12/05/14 Tdap vaccine: 12/07/12 Shingles vaccine: Zostavax 05/16/08   Covid-19: 06/08/19, 07/06/19  Advanced directives: no  Conditions/risks identified: none  Next appointment: Follow up in one year for your annual wellness visit. - declined  Preventive Care 85 Years and Older, Male Preventive care refers to lifestyle choices and visits with your health care provider that can promote health and wellness. What does preventive care include? A yearly physical exam. This is also called an annual well check. Dental exams once or twice a year. Routine eye exams. Ask your health care provider how often you should have your eyes checked. Personal lifestyle choices, including: Daily care of your teeth and gums. Regular physical activity. Eating a healthy diet. Avoiding tobacco and drug use. Limiting alcohol use. Practicing safe sex. Taking low doses of aspirin every day. Taking vitamin and mineral supplements as recommended by your health care provider. What happens during an annual well check? The services and screenings done by your health care provider during your annual well check will depend on your age, overall health, lifestyle risk factors, and family history of disease. Counseling  Your health care provider may ask you questions about your: Alcohol use. Tobacco use. Drug use. Emotional well-being. Home and relationship well-being. Sexual activity. Eating habits. History of  falls. Memory and ability to understand (cognition). Work and work Statistician. Screening  You may have the following tests or measurements: Height, weight, and BMI. Blood pressure. Lipid and cholesterol levels. These may be checked every 5 years, or more frequently if you are over 46 years old. Skin check. Lung cancer screening. You may have this screening every year starting at age 71 if you have a 30-pack-year history of smoking and currently smoke or have quit within the past 15 years. Fecal occult blood test (FOBT) of the stool. You may have this test every year starting at age 57. Flexible sigmoidoscopy or colonoscopy. You may have a sigmoidoscopy every 5 years or a colonoscopy every 10 years starting at age 53. Prostate cancer screening. Recommendations will vary depending on your family history and other risks. Hepatitis C blood test. Hepatitis B blood test. Sexually transmitted disease (STD) testing. Diabetes screening. This is done by checking your blood sugar (glucose) after you have not eaten for a while (fasting). You may have this done every 1-3 years. Abdominal aortic aneurysm (AAA) screening. You may need this if you are a current or former smoker. Osteoporosis. You may be screened starting at age 25 if you are at high risk. Talk with your health care provider about your test results, treatment options, and if necessary, the need for more tests. Vaccines  Your health care provider may recommend certain vaccines, such as: Influenza vaccine. This is recommended every year. Tetanus, diphtheria, and acellular pertussis (Tdap, Td) vaccine. You may need a Td booster every 10 years. Zoster vaccine. You may need this after age 44. Pneumococcal 13-valent conjugate (PCV13) vaccine. One dose is recommended after age 39. Pneumococcal polysaccharide (PPSV23) vaccine. One dose is recommended after age 71. Talk to your health care provider about which  screenings and vaccines you need and  how often you need them. This information is not intended to replace advice given to you by your health care provider. Make sure you discuss any questions you have with your health care provider. Document Released: 04/12/2015 Document Revised: 12/04/2015 Document Reviewed: 01/15/2015 Elsevier Interactive Patient Education  2017 [place] Prevention in the Home Falls can cause injuries. They can happen to people of all ages. There are many things you can do to make your home safe and to help prevent falls. What can I do on the outside of my home? Regularly fix the edges of walkways and driveways and fix any cracks. Remove anything that might make you trip as you walk through a door, such as a raised step or threshold. Trim any bushes or trees on the path to your home. Use bright outdoor lighting. Clear any walking paths of anything that might make someone trip, such as rocks or tools. Regularly check to see if handrails are loose or broken. Make sure that both sides of any steps have handrails. Any raised decks and porches should have guardrails on the edges. Have any leaves, snow, or ice cleared regularly. Use sand or salt on walking paths during winter. Clean up any spills in your garage right away. This includes oil or grease spills. What can I do in the bathroom? Use night lights. Install grab bars by the toilet and in the tub and shower. Do not use towel bars as grab bars. Use non-skid mats or decals in the tub or shower. If you need to sit down in the shower, use a plastic, non-slip stool. Keep the floor dry. Clean up any water that spills on the floor as soon as it happens. Remove soap buildup in the tub or shower regularly. Attach bath mats securely with double-sided non-slip rug tape. Do not have throw rugs and other things on the floor that can make you trip. What can I do in the bedroom? Use night lights. Make sure that you have a light by your bed that is easy to  reach. Do not use any sheets or blankets that are too big for your bed. They should not hang down onto the floor. Have a firm chair that has side arms. You can use this for support while you get dressed. Do not have throw rugs and other things on the floor that can make you trip. What can I do in the kitchen? Clean up any spills right away. Avoid walking on wet floors. Keep items that you use a lot in easy-to-reach places. If you need to reach something above you, use a strong step stool that has a grab bar. Keep electrical cords out of the way. Do not use floor polish or wax that makes floors slippery. If you must use wax, use non-skid floor wax. Do not have throw rugs and other things on the floor that can make you trip. What can I do with my stairs? Do not leave any items on the stairs. Make sure that there are handrails on both sides of the stairs and use them. Fix handrails that are broken or loose. Make sure that handrails are as long as the stairways. Check any carpeting to make sure that it is firmly attached to the stairs. Fix any carpet that is loose or worn. Avoid having throw rugs at the top or bottom of the stairs. If you do have throw rugs, attach them to the floor with carpet  tape. Make sure that you have a light switch at the top of the stairs and the bottom of the stairs. If you do not have them, ask someone to add them for you. What else can I do to help prevent falls? Wear shoes that: Do not have high heels. Have rubber bottoms. Are comfortable and fit you well. Are closed at the toe. Do not wear sandals. If you use a stepladder: Make sure that it is fully opened. Do not climb a closed stepladder. Make sure that both sides of the stepladder are locked into place. Ask someone to hold it for you, if possible. Clearly mark and make sure that you can see: Any grab bars or handrails. First and last steps. Where the edge of each step is. Use tools that help you move  around (mobility aids) if they are needed. These include: Canes. Walkers. Scooters. Crutches. Turn on the lights when you go into a dark area. Replace any light bulbs as soon as they burn out. Set up your furniture so you have a clear path. Avoid moving your furniture around. If any of your floors are uneven, fix them. If there are any pets around you, be aware of where they are. Review your medicines with your doctor. Some medicines can make you feel dizzy. This can increase your chance of falling. Ask your doctor what other things that you can do to help prevent falls. This information is not intended to replace advice given to you by your health care provider. Make sure you discuss any questions you have with your health care provider. Document Released: 01/10/2009 Document Revised: 08/22/2015 Document Reviewed: 04/20/2014 Elsevier Interactive Patient Education  2017 [employer].

## 2022-01-08 ENCOUNTER — Telehealth: Payer: Self-pay | Admitting: Family Medicine

## 2022-01-08 DIAGNOSIS — N183 Chronic kidney disease, stage 3 unspecified: Secondary | ICD-10-CM

## 2022-01-08 DIAGNOSIS — E782 Mixed hyperlipidemia: Secondary | ICD-10-CM

## 2022-01-08 NOTE — Telephone Encounter (Signed)
Patient is requesting labs for follow up visit on 10/30.

## 2022-01-09 NOTE — Telephone Encounter (Signed)
Last labs completed 10/03/21-CBC and BMET. Please advise. Thank you

## 2022-01-12 NOTE — Telephone Encounter (Signed)
Metabolic 7, lipid Hyperlipidemia, chronic kidney disease stage IIIb

## 2022-01-12 NOTE — Telephone Encounter (Signed)
Orders placed.

## 2022-01-19 DIAGNOSIS — N183 Chronic kidney disease, stage 3 unspecified: Secondary | ICD-10-CM | POA: Diagnosis not present

## 2022-01-19 DIAGNOSIS — E782 Mixed hyperlipidemia: Secondary | ICD-10-CM | POA: Diagnosis not present

## 2022-01-21 LAB — LIPID PANEL
Chol/HDL Ratio: 3.4 ratio (ref 0.0–5.0)
Cholesterol, Total: 92 mg/dL — ABNORMAL LOW (ref 100–199)
HDL: 27 mg/dL — ABNORMAL LOW (ref >39)
LDL Chol Calc (NIH): 46 mg/dL (ref 0–99)
Triglycerides: 100 mg/dL (ref 0–149)
VLDL Cholesterol Cal: 19 mg/dL (ref 5–40)

## 2022-01-21 LAB — BASIC METABOLIC PANEL (7)
BUN/Creatinine Ratio: 18 (ref 10–24)
BUN: 27 mg/dL (ref 8–27)
CO2: 19 mmol/L — ABNORMAL LOW (ref 20–29)
Chloride: 106 mmol/L (ref 96–106)
Creatinine, Ser: 1.51 mg/dL — ABNORMAL HIGH (ref 0.76–1.27)
Glucose: 97 mg/dL (ref 70–99)
Potassium: 4.3 mmol/L (ref 3.5–5.2)
Sodium: 142 mmol/L (ref 134–144)
eGFR: 45 mL/min/{1.73_m2} — ABNORMAL LOW (ref >59)

## 2022-01-26 ENCOUNTER — Ambulatory Visit (INDEPENDENT_AMBULATORY_CARE_PROVIDER_SITE_OTHER): Payer: PPO | Admitting: Family Medicine

## 2022-01-26 VITALS — BP 157/99 | HR 71 | Temp 98.2°F | Ht 68.0 in | Wt 166.0 lb

## 2022-01-26 DIAGNOSIS — R270 Ataxia, unspecified: Secondary | ICD-10-CM

## 2022-01-26 DIAGNOSIS — R200 Anesthesia of skin: Secondary | ICD-10-CM

## 2022-01-26 DIAGNOSIS — R531 Weakness: Secondary | ICD-10-CM | POA: Diagnosis not present

## 2022-01-26 NOTE — Progress Notes (Unsigned)
   Subjective:    Patient ID: [name] [name], male    DOB: 08-24-1934, 87 y.o.   MRN: [id number]  HPI Hypertension follow up  Ataxia - Plan: Ambulatory referral to Physical Therapy  Weakness - Plan: Ambulatory referral to Physical Therapy  Hand numbness - Plan: Ambulatory referral to Physical Therapy  Patient also reports having multiple site joint pain and foot and hand numbness And complains that hand numbness states comes and goes Patient also relates some mild weakness along with ataxia and imbalance issues  Review of Systems     Objective:   Physical Exam  General-in no acute distress Eyes-no discharge Lungs-respiratory rate normal, CTA CV-no murmurs,RRR Extremities skin warm dry no edema Neuro grossly normal Behavior normal, alert       Assessment & Plan:  1. Ataxia Referral to physical therapy for further evaluation - Ambulatory referral to Physical Therapy  2. Weakness Strengthening would help him - Ambulatory referral to Physical Therapy  3. Hand numbness Recommend referral to orthopedics patient defers currently - Ambulatory referral to Physical Therapy
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
## 2022-01-29 ENCOUNTER — Ambulatory Visit: Payer: PPO | Attending: Physician Assistant | Admitting: Physician Assistant

## 2022-01-29 ENCOUNTER — Encounter: Payer: Self-pay | Admitting: Physician Assistant

## 2022-01-29 VITALS — BP 132/68 | HR 63 | Ht 68.0 in | Wt 167.8 lb

## 2022-01-29 DIAGNOSIS — E782 Mixed hyperlipidemia: Secondary | ICD-10-CM

## 2022-01-29 DIAGNOSIS — I10 Essential (primary) hypertension: Secondary | ICD-10-CM | POA: Diagnosis not present

## 2022-01-29 DIAGNOSIS — I2510 Atherosclerotic heart disease of native coronary artery without angina pectoris: Secondary | ICD-10-CM

## 2022-01-29 DIAGNOSIS — I5042 Chronic combined systolic (congestive) and diastolic (congestive) heart failure: Secondary | ICD-10-CM | POA: Diagnosis not present

## 2022-01-29 DIAGNOSIS — I7143 Infrarenal abdominal aortic aneurysm, without rupture: Secondary | ICD-10-CM | POA: Diagnosis not present

## 2022-01-29 DIAGNOSIS — Z9861 Coronary angioplasty status: Secondary | ICD-10-CM

## 2022-01-29 DIAGNOSIS — I6522 Occlusion and stenosis of left carotid artery: Secondary | ICD-10-CM | POA: Diagnosis not present

## 2022-01-29 DIAGNOSIS — I739 Peripheral vascular disease, unspecified: Secondary | ICD-10-CM | POA: Diagnosis not present

## 2022-01-29 DIAGNOSIS — N183 Chronic kidney disease, stage 3 unspecified: Secondary | ICD-10-CM | POA: Diagnosis not present

## 2022-01-29 MED ORDER — ATORVASTATIN CALCIUM 20 MG PO TABS: ORAL_TABLET | ORAL | 3 refills | Status: DC

## 2022-01-29 MED ORDER — ENTRESTO 49-51 MG PO TABS: 1.0000 | ORAL_TABLET | Freq: Two times a day (BID) | ORAL | 3 refills | Status: DC

## 2022-01-29 MED ORDER — FUROSEMIDE 20 MG PO TABS: 20.0000 mg | ORAL_TABLET | Freq: Every day | ORAL | 3 refills | Status: DC

## 2022-01-29 MED ORDER — CARVEDILOL 6.25 MG PO TABS: 6.2500 mg | ORAL_TABLET | Freq: Two times a day (BID) | ORAL | 1 refills | Status: DC

## 2022-01-29 MED ORDER — CLOPIDOGREL BISULFATE 75 MG PO TABS: 75.0000 mg | ORAL_TABLET | Freq: Every day | ORAL | 1 refills | Status: DC

## 2022-01-29 NOTE — Patient Instructions (Signed)
Medication Instructions:  No Changes *If you need a refill on your cardiac medications before your next appointment, please call your pharmacy*   Lab Work: No Labs If you have labs (blood work) drawn today and your tests are completely normal, you will receive your results only by: [place] (if you have MyChart) OR A paper copy in the mail If you have any lab test that is abnormal or we need to change your treatment, we will call you to review the results.   Testing/Procedures: No Testing   Follow-Up: At [facility], you and your health needs are our priority.  As part of our continuing mission to provide you with exceptional heart care, we have created designated Provider Care Teams.  These Care Teams include your primary Cardiologist (physician) and Advanced Practice Providers (APPs -  Physician Assistants and Nurse Practitioners) who all work together to provide you with the care you need, when you need it.  We recommend signing up for the patient portal called "MyChart".  Sign up information is provided on this After Visit Summary.  MyChart is used to connect with patients for Virtual Visits (Telemedicine).  Patients are able to view lab/test results, encounter notes, upcoming appointments, etc.  Non-urgent messages can be sent to your provider as well.   To learn more about what you can do with MyChart, go to [email or web address].    Your next appointment:   4-6 months  The format for your next appointment:   In Person  Provider:   Quay Burow, MD

## 2022-01-30 ENCOUNTER — Telehealth: Payer: Self-pay | Admitting: Cardiovascular Disease

## 2022-01-30 NOTE — Telephone Encounter (Signed)
Patient calling the office for samples of medication:   1.  What medication and dosage are you requesting samples for? Entresto   2.  Are you currently out of this medication?   Yes. Pt's daughter was told yesterday they could have samples, but was not given any. Would like to come by for said samples.

## 2022-01-30 NOTE — Telephone Encounter (Signed)
Spoke to patient's daughter Elliot Gurney 49/51 mg left at [employer] office front desk.

## 2022-02-03 ENCOUNTER — Other Ambulatory Visit: Payer: Self-pay

## 2022-02-03 ENCOUNTER — Ambulatory Visit: Payer: PPO | Attending: Family Medicine

## 2022-02-03 DIAGNOSIS — M6281 Muscle weakness (generalized): Secondary | ICD-10-CM | POA: Diagnosis not present

## 2022-02-03 DIAGNOSIS — R270 Ataxia, unspecified: Secondary | ICD-10-CM | POA: Diagnosis not present

## 2022-02-03 DIAGNOSIS — R531 Weakness: Secondary | ICD-10-CM | POA: Insufficient documentation

## 2022-02-03 DIAGNOSIS — R262 Difficulty in walking, not elsewhere classified: Secondary | ICD-10-CM | POA: Diagnosis not present

## 2022-02-03 DIAGNOSIS — R2681 Unsteadiness on feet: Secondary | ICD-10-CM | POA: Insufficient documentation

## 2022-02-03 DIAGNOSIS — R2689 Other abnormalities of gait and mobility: Secondary | ICD-10-CM | POA: Insufficient documentation
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
## 2022-02-09 ENCOUNTER — Ambulatory Visit: Payer: PPO | Admitting: Physical Therapy

## 2022-02-09 ENCOUNTER — Encounter: Payer: Self-pay | Admitting: Physical Therapy

## 2022-02-09 DIAGNOSIS — R2689 Other abnormalities of gait and mobility: Secondary | ICD-10-CM

## 2022-02-09 DIAGNOSIS — R2681 Unsteadiness on feet: Secondary | ICD-10-CM | POA: Diagnosis not present

## 2022-02-09 DIAGNOSIS — R262 Difficulty in walking, not elsewhere classified: Secondary | ICD-10-CM

## 2022-02-09 DIAGNOSIS — M6281 Muscle weakness (generalized): Secondary | ICD-10-CM
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
## 2022-02-11 ENCOUNTER — Encounter: Payer: Self-pay | Admitting: Physical Therapy

## 2022-02-11 ENCOUNTER — Ambulatory Visit: Payer: PPO | Admitting: Physical Therapy

## 2022-02-11 DIAGNOSIS — R2689 Other abnormalities of gait and mobility: Secondary | ICD-10-CM

## 2022-02-11 DIAGNOSIS — R262 Difficulty in walking, not elsewhere classified: Secondary | ICD-10-CM

## 2022-02-11 DIAGNOSIS — M6281 Muscle weakness (generalized): Secondary | ICD-10-CM

## 2022-02-11 DIAGNOSIS — R2681 Unsteadiness on feet: Secondary | ICD-10-CM
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
## 2022-02-16 ENCOUNTER — Ambulatory Visit: Payer: PPO | Admitting: Physical Therapy

## 2022-02-16 ENCOUNTER — Encounter: Payer: Self-pay | Admitting: Physical Therapy

## 2022-02-16 DIAGNOSIS — M6281 Muscle weakness (generalized): Secondary | ICD-10-CM

## 2022-02-16 DIAGNOSIS — R2689 Other abnormalities of gait and mobility: Secondary | ICD-10-CM

## 2022-02-16 DIAGNOSIS — R2681 Unsteadiness on feet: Secondary | ICD-10-CM | POA: Diagnosis not present

## 2022-02-16 DIAGNOSIS — R262 Difficulty in walking, not elsewhere classified: Secondary | ICD-10-CM
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
## 2022-02-18 ENCOUNTER — Ambulatory Visit: Payer: PPO | Admitting: Physical Therapy

## 2022-02-18 ENCOUNTER — Encounter: Payer: Self-pay | Admitting: Physical Therapy

## 2022-02-18 DIAGNOSIS — R262 Difficulty in walking, not elsewhere classified: Secondary | ICD-10-CM

## 2022-02-18 DIAGNOSIS — M6281 Muscle weakness (generalized): Secondary | ICD-10-CM

## 2022-02-18 DIAGNOSIS — R2681 Unsteadiness on feet: Secondary | ICD-10-CM | POA: Diagnosis not present

## 2022-02-18 DIAGNOSIS — R2689 Other abnormalities of gait and mobility: Secondary | ICD-10-CM
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
## 2022-02-23 ENCOUNTER — Encounter: Payer: Self-pay | Admitting: Physical Therapy

## 2022-02-23 ENCOUNTER — Ambulatory Visit: Payer: PPO | Admitting: Physical Therapy

## 2022-02-23 DIAGNOSIS — R262 Difficulty in walking, not elsewhere classified: Secondary | ICD-10-CM

## 2022-02-23 DIAGNOSIS — R2689 Other abnormalities of gait and mobility: Secondary | ICD-10-CM

## 2022-02-23 DIAGNOSIS — M6281 Muscle weakness (generalized): Secondary | ICD-10-CM

## 2022-02-23 DIAGNOSIS — R2681 Unsteadiness on feet: Secondary | ICD-10-CM | POA: Diagnosis not present

## 2022-02-25 ENCOUNTER — Ambulatory Visit: Payer: PPO

## 2022-02-25 DIAGNOSIS — R2681 Unsteadiness on feet: Secondary | ICD-10-CM

## 2022-02-25 DIAGNOSIS — M6281 Muscle weakness (generalized): Secondary | ICD-10-CM

## 2022-02-25 DIAGNOSIS — R2689 Other abnormalities of gait and mobility: Secondary | ICD-10-CM

## 2022-02-25 DIAGNOSIS — R262 Difficulty in walking, not elsewhere classified: Secondary | ICD-10-CM
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
## 2022-03-01 ENCOUNTER — Emergency Department (HOSPITAL_COMMUNITY): Payer: PPO

## 2022-03-01 ENCOUNTER — Emergency Department (HOSPITAL_COMMUNITY)
Admission: EM | Admit: 2022-03-01 | Discharge: 2022-03-01 | Disposition: A | Discharge status: Home / Self Care | Payer: PPO | Attending: Emergency Medicine | Admitting: Emergency Medicine

## 2022-03-01 DIAGNOSIS — Z7902 Long term (current) use of antithrombotics/antiplatelets: Secondary | ICD-10-CM | POA: Insufficient documentation

## 2022-03-01 DIAGNOSIS — M47812 Spondylosis without myelopathy or radiculopathy, cervical region: Secondary | ICD-10-CM | POA: Insufficient documentation

## 2022-03-01 DIAGNOSIS — M25562 Pain in left knee: Secondary | ICD-10-CM | POA: Diagnosis not present

## 2022-03-01 DIAGNOSIS — I6782 Cerebral ischemia: Secondary | ICD-10-CM | POA: Diagnosis not present

## 2022-03-01 DIAGNOSIS — M25519 Pain in unspecified shoulder: Secondary | ICD-10-CM | POA: Diagnosis not present

## 2022-03-01 DIAGNOSIS — Z79899 Other long term (current) drug therapy: Secondary | ICD-10-CM | POA: Insufficient documentation

## 2022-03-01 DIAGNOSIS — S161XXA Strain of muscle, fascia and tendon at neck level, initial encounter: Secondary | ICD-10-CM | POA: Insufficient documentation

## 2022-03-01 DIAGNOSIS — Z8572 Personal history of non-Hodgkin lymphomas: Secondary | ICD-10-CM | POA: Insufficient documentation

## 2022-03-01 DIAGNOSIS — T68XXXA Hypothermia, initial encounter: Secondary | ICD-10-CM | POA: Diagnosis not present

## 2022-03-01 DIAGNOSIS — G319 Degenerative disease of nervous system, unspecified: Secondary | ICD-10-CM | POA: Diagnosis not present

## 2022-03-01 DIAGNOSIS — I110 Hypertensive heart disease with heart failure: Secondary | ICD-10-CM | POA: Insufficient documentation

## 2022-03-01 DIAGNOSIS — I509 Heart failure, unspecified: Secondary | ICD-10-CM | POA: Diagnosis not present

## 2022-03-01 DIAGNOSIS — I2510 Atherosclerotic heart disease of native coronary artery without angina pectoris: Secondary | ICD-10-CM | POA: Insufficient documentation

## 2022-03-01 DIAGNOSIS — M542 Cervicalgia: Secondary | ICD-10-CM | POA: Diagnosis not present

## 2022-03-01 DIAGNOSIS — I739 Peripheral vascular disease, unspecified: Secondary | ICD-10-CM | POA: Insufficient documentation

## 2022-03-01 DIAGNOSIS — Z041 Encounter for examination and observation following transport accident: Secondary | ICD-10-CM | POA: Diagnosis not present

## 2022-03-01 DIAGNOSIS — J90 Pleural effusion, not elsewhere classified: Secondary | ICD-10-CM | POA: Diagnosis not present

## 2022-03-01 DIAGNOSIS — M4312 Spondylolisthesis, cervical region: Secondary | ICD-10-CM | POA: Diagnosis not present

## 2022-03-01 DIAGNOSIS — Z8673 Personal history of transient ischemic attack (TIA), and cerebral infarction without residual deficits: Secondary | ICD-10-CM | POA: Diagnosis not present

## 2022-03-01 DIAGNOSIS — I6523 Occlusion and stenosis of bilateral carotid arteries: Secondary | ICD-10-CM | POA: Diagnosis not present

## 2022-03-01 DIAGNOSIS — M25512 Pain in left shoulder: Secondary | ICD-10-CM | POA: Diagnosis not present

## 2022-03-01 DIAGNOSIS — Y92410 Unspecified street and highway as the place of occurrence of the external cause: Secondary | ICD-10-CM | POA: Insufficient documentation

## 2022-03-01 DIAGNOSIS — S80919A Unspecified superficial injury of unspecified knee, initial encounter: Secondary | ICD-10-CM | POA: Diagnosis not present

## 2022-03-01 DIAGNOSIS — R079 Chest pain, unspecified: Secondary | ICD-10-CM | POA: Diagnosis not present

## 2022-03-01 DIAGNOSIS — S0990XA Unspecified injury of head, initial encounter: Secondary | ICD-10-CM | POA: Diagnosis not present
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
## 2022-03-02 ENCOUNTER — Ambulatory Visit: Payer: PPO | Admitting: Physical Therapy

## 2022-03-09 ENCOUNTER — Ambulatory Visit: Payer: PPO

## 2022-03-11 ENCOUNTER — Encounter: Payer: Self-pay | Admitting: Family Medicine

## 2022-03-11 ENCOUNTER — Ambulatory Visit (INDEPENDENT_AMBULATORY_CARE_PROVIDER_SITE_OTHER): Payer: PPO | Admitting: Family Medicine

## 2022-03-11 VITALS — BP 130/80 | HR 88 | Temp 98.2°F | Wt 166.2 lb

## 2022-03-11 DIAGNOSIS — M542 Cervicalgia: Secondary | ICD-10-CM | POA: Diagnosis not present

## 2022-03-11 DIAGNOSIS — M25511 Pain in right shoulder: Secondary | ICD-10-CM | POA: Diagnosis not present

## 2022-03-11 DIAGNOSIS — M25512 Pain in left shoulder: Secondary | ICD-10-CM | POA: Diagnosis not present
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
## 2022-03-16 ENCOUNTER — Ambulatory Visit: Payer: PPO

## 2022-03-20 ENCOUNTER — Inpatient Hospital Stay: Payer: PPO | Admitting: Oncology

## 2022-03-20 ENCOUNTER — Inpatient Hospital Stay: Payer: PPO | Attending: Oncology

## 2022-03-31 ENCOUNTER — Ambulatory Visit: Payer: PPO | Admitting: Family Medicine

## 2022-04-03 ENCOUNTER — Other Ambulatory Visit: Payer: Self-pay | Admitting: Family Medicine

## 2022-04-14 ENCOUNTER — Telehealth: Payer: Self-pay

## 2022-04-14 DIAGNOSIS — I10 Essential (primary) hypertension: Secondary | ICD-10-CM

## 2022-04-14 DIAGNOSIS — N183 Chronic kidney disease, stage 3 unspecified: Secondary | ICD-10-CM

## 2022-04-14 NOTE — Telephone Encounter (Signed)
Last labs completed 01/19/22- BMET and Lipid. Please advise. Thank you

## 2022-04-14 NOTE — Telephone Encounter (Signed)
Pt coming in 29th  the end of the month and needs blood work ordered

## 2022-04-15 NOTE — Telephone Encounter (Signed)
Lab orders placed and wife made aware (OK per DPR)

## 2022-04-15 NOTE — Addendum Note (Signed)
Addended by: Vicente Males on: 04/15/2022 10:26 AM   Modules accepted: Orders

## 2022-04-15 NOTE — Telephone Encounter (Signed)
Previous lipid profile looked good.  I would recommend CMP with estimated GFR as well as urine ACR because of CKD 3 and HTN

## 2022-04-15 NOTE — Therapy (Signed)
[facility] [address] [place], [place], [address] Phone: [phone]   Fax:  [phone]  Patient Details  Name: [name] [name] MRN: [id number] Date of Birth: 1934-10-01 Referring Provider:  No ref. provider found  Encounter Date: 04/15/2022 PHYSICAL THERAPY DISCHARGE SUMMARY  Visits from Start of Care: 7  Current functional level related to goals / functional outcomes: Unknown, N/A   Remaining deficits: N/A   Education / Equipment: HEP initiated   Patient agrees to discharge. Patient goals were  N/A . Patient is being discharged due to not returning since the last visit.   Toniann Fail, PT 04/15/2022, 2:28 PM  [facility] [address] [place], [place], [address] Phone: [phone]   Fax:  [phone]

## 2022-04-20 DIAGNOSIS — N183 Chronic kidney disease, stage 3 unspecified: Secondary | ICD-10-CM | POA: Diagnosis not present

## 2022-04-20 DIAGNOSIS — I10 Essential (primary) hypertension: Secondary | ICD-10-CM | POA: Diagnosis not present

## 2022-04-21 LAB — CMP14+EGFR
ALT: 6 IU/L (ref 0–44)
AST: 11 IU/L (ref 0–40)
Albumin/Globulin Ratio: 2.3 — ABNORMAL HIGH (ref 1.2–2.2)
Albumin: 4.5 g/dL (ref 3.7–4.7)
Alkaline Phosphatase: 67 IU/L (ref 44–121)
BUN/Creatinine Ratio: 20 (ref 10–24)
BUN: 29 mg/dL — ABNORMAL HIGH (ref 8–27)
Bilirubin Total: 0.4 mg/dL (ref 0.0–1.2)
CO2: 18 mmol/L — ABNORMAL LOW (ref 20–29)
Calcium: 9.1 mg/dL (ref 8.6–10.2)
Chloride: 103 mmol/L (ref 96–106)
Creatinine, Ser: 1.44 mg/dL — ABNORMAL HIGH (ref 0.76–1.27)
Globulin, Total: 2 g/dL (ref 1.5–4.5)
Glucose: 95 mg/dL (ref 70–99)
Potassium: 4.2 mmol/L (ref 3.5–5.2)
Sodium: 137 mmol/L (ref 134–144)
Total Protein: 6.5 g/dL (ref 6.0–8.5)
eGFR: 48 mL/min/{1.73_m2} — ABNORMAL LOW (ref >59)

## 2022-04-21 LAB — MICROALBUMIN / CREATININE URINE RATIO
Creatinine, Urine: 57.7 mg/dL
Microalb/Creat Ratio: 423 mg/g creat — ABNORMAL HIGH (ref 0–29)
Microalbumin, Urine: 244.1 ug/mL

## 2022-04-24 ENCOUNTER — Other Ambulatory Visit: Payer: Self-pay | Admitting: Physician Assistant

## 2022-04-24 ENCOUNTER — Other Ambulatory Visit: Payer: Self-pay | Admitting: Family Medicine

## 2022-04-27 NOTE — Telephone Encounter (Signed)
6 months on Protonix would be fine  Please check base with family regarding the hydralazine is he currently taking this or not?  Do not want to refill if he is not taking it

## 2022-04-28 ENCOUNTER — Inpatient Hospital Stay: Payer: PPO | Attending: Oncology

## 2022-04-28 ENCOUNTER — Inpatient Hospital Stay: Payer: PPO | Admitting: Oncology

## 2022-04-28 VITALS — BP 140/92 | HR 64 | Temp 98.1°F | Resp 18 | Ht 68.0 in | Wt 166.0 lb

## 2022-04-28 DIAGNOSIS — I2510 Atherosclerotic heart disease of native coronary artery without angina pectoris: Secondary | ICD-10-CM | POA: Diagnosis not present

## 2022-04-28 DIAGNOSIS — C8510 Unspecified B-cell lymphoma, unspecified site: Secondary | ICD-10-CM

## 2022-04-28 DIAGNOSIS — I110 Hypertensive heart disease with heart failure: Secondary | ICD-10-CM | POA: Diagnosis not present

## 2022-04-28 DIAGNOSIS — I739 Peripheral vascular disease, unspecified: Secondary | ICD-10-CM | POA: Diagnosis not present

## 2022-04-28 DIAGNOSIS — I509 Heart failure, unspecified: Secondary | ICD-10-CM | POA: Diagnosis not present

## 2022-04-28 DIAGNOSIS — D509 Iron deficiency anemia, unspecified: Secondary | ICD-10-CM | POA: Insufficient documentation

## 2022-04-28 DIAGNOSIS — C8519 Unspecified B-cell lymphoma, extranodal and solid organ sites: Secondary | ICD-10-CM | POA: Insufficient documentation

## 2022-04-28 DIAGNOSIS — D696 Thrombocytopenia, unspecified: Secondary | ICD-10-CM | POA: Diagnosis not present

## 2022-04-28 LAB — CMP (CANCER CENTER ONLY)
ALT: 6 U/L (ref 0–44)
AST: 10 U/L — ABNORMAL LOW (ref 15–41)
Albumin: 4 g/dL (ref 3.5–5.0)
Alkaline Phosphatase: 52 U/L (ref 38–126)
Anion gap: 7 (ref 5–15)
BUN: 29 mg/dL — ABNORMAL HIGH (ref 8–23)
CO2: 22 mmol/L (ref 22–32)
Calcium: 8.4 mg/dL — ABNORMAL LOW (ref 8.9–10.3)
Chloride: 109 mmol/L (ref 98–111)
Creatinine: 1.54 mg/dL — ABNORMAL HIGH (ref 0.61–1.24)
GFR, Estimated: 44 mL/min — ABNORMAL LOW (ref >60)
Glucose, Bld: 96 mg/dL (ref 70–99)
Potassium: 4.3 mmol/L (ref 3.5–5.1)
Sodium: 138 mmol/L (ref 135–145)
Total Bilirubin: 0.4 mg/dL (ref 0.3–1.2)
Total Protein: 6.2 g/dL — ABNORMAL LOW (ref 6.5–8.1)

## 2022-04-28 LAB — CBC WITH DIFFERENTIAL (CANCER CENTER ONLY)
Abs Immature Granulocytes: 0.02 10*3/uL (ref 0.00–0.07)
Basophils Absolute: 0 10*3/uL (ref 0.0–0.1)
Basophils Relative: 1 %
Eosinophils Absolute: 0.2 10*3/uL (ref 0.0–0.5)
Eosinophils Relative: 2 %
HCT: 38.8 % — ABNORMAL LOW (ref 39.0–52.0)
Hemoglobin: 12.2 g/dL — ABNORMAL LOW (ref 13.0–17.0)
Immature Granulocytes: 0 %
Lymphocytes Relative: 10 %
Lymphs Abs: 0.8 10*3/uL (ref 0.7–4.0)
MCH: 27.6 pg (ref 26.0–34.0)
MCHC: 31.4 g/dL (ref 30.0–36.0)
MCV: 87.8 fL (ref 80.0–100.0)
Monocytes Absolute: 0.6 10*3/uL (ref 0.1–1.0)
Monocytes Relative: 8 %
Neutro Abs: 6.1 10*3/uL (ref 1.7–7.7)
Neutrophils Relative %: 79 %
Platelet Count: 69 10*3/uL — ABNORMAL LOW (ref 150–400)
RBC: 4.42 MIL/uL (ref 4.22–5.81)
RDW: 16.4 % — ABNORMAL HIGH (ref 11.5–15.5)
WBC Count: 7.7 10*3/uL (ref 4.0–10.5)
nRBC: 0 % (ref 0.0–0.2)

## 2022-04-28 LAB — LACTATE DEHYDROGENASE: LDH: 164 U/L (ref 98–192)
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
## 2022-04-29 MED ORDER — PANTOPRAZOLE SODIUM 20 MG PO TBEC: 20.0000 mg | DELAYED_RELEASE_TABLET | Freq: Two times a day (BID) | ORAL | 1 refills | Status: DC

## 2022-05-12 ENCOUNTER — Ambulatory Visit (INDEPENDENT_AMBULATORY_CARE_PROVIDER_SITE_OTHER): Payer: PPO | Admitting: Family Medicine

## 2022-05-12 ENCOUNTER — Ambulatory Visit: Payer: PPO | Admitting: Family Medicine

## 2022-05-12 VITALS — HR 73 | Wt 167.4 lb

## 2022-05-12 DIAGNOSIS — G609 Hereditary and idiopathic neuropathy, unspecified: Secondary | ICD-10-CM

## 2022-05-12 DIAGNOSIS — M2550 Pain in unspecified joint: Secondary | ICD-10-CM | POA: Diagnosis not present

## 2022-05-12 DIAGNOSIS — M791 Myalgia, unspecified site: Secondary | ICD-10-CM | POA: Diagnosis not present

## 2022-05-12 DIAGNOSIS — M542 Cervicalgia: Secondary | ICD-10-CM | POA: Diagnosis not present

## 2022-05-12 MED ORDER — IPRATROPIUM BROMIDE 0.03 % NA SOLN: 2.0000 | Freq: Two times a day (BID) | NASAL | 12 refills | Status: DC
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
## 2022-05-13 LAB — CK: Total CK: 80 U/L (ref 30–208)

## 2022-05-13 LAB — C-REACTIVE PROTEIN: CRP: 17 mg/L — ABNORMAL HIGH (ref 0–10)

## 2022-05-13 LAB — SEDIMENTATION RATE: Sed Rate: 3 mm/hr (ref 0–30)

## 2022-05-19 ENCOUNTER — Other Ambulatory Visit: Payer: Self-pay

## 2022-05-19 ENCOUNTER — Other Ambulatory Visit: Payer: Self-pay | Admitting: Physician Assistant

## 2022-05-19 ENCOUNTER — Ambulatory Visit: Payer: PPO | Attending: Family Medicine

## 2022-05-19 DIAGNOSIS — M6281 Muscle weakness (generalized): Secondary | ICD-10-CM | POA: Insufficient documentation

## 2022-05-19 DIAGNOSIS — R2681 Unsteadiness on feet: Secondary | ICD-10-CM | POA: Insufficient documentation

## 2022-05-19 DIAGNOSIS — R293 Abnormal posture: Secondary | ICD-10-CM

## 2022-05-19 DIAGNOSIS — R2689 Other abnormalities of gait and mobility: Secondary | ICD-10-CM | POA: Insufficient documentation

## 2022-05-19 DIAGNOSIS — M542 Cervicalgia: Secondary | ICD-10-CM | POA: Diagnosis not present

## 2022-05-19 DIAGNOSIS — R262 Difficulty in walking, not elsewhere classified: Secondary | ICD-10-CM | POA: Diagnosis not present

## 2022-05-19 DIAGNOSIS — R252 Cramp and spasm: Secondary | ICD-10-CM | POA: Diagnosis not present
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
## 2022-05-20 ENCOUNTER — Other Ambulatory Visit: Payer: Self-pay | Admitting: Cardiovascular Disease
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
## 2022-05-27 ENCOUNTER — Encounter: Payer: Self-pay | Admitting: Physical Therapy

## 2022-05-27 ENCOUNTER — Ambulatory Visit: Payer: PPO | Admitting: Physical Therapy

## 2022-05-27 DIAGNOSIS — R262 Difficulty in walking, not elsewhere classified: Secondary | ICD-10-CM

## 2022-05-27 DIAGNOSIS — M6281 Muscle weakness (generalized): Secondary | ICD-10-CM

## 2022-05-27 DIAGNOSIS — R2681 Unsteadiness on feet: Secondary | ICD-10-CM | POA: Diagnosis not present

## 2022-05-27 DIAGNOSIS — R2689 Other abnormalities of gait and mobility: Secondary | ICD-10-CM

## 2022-05-27 DIAGNOSIS — R293 Abnormal posture: Secondary | ICD-10-CM

## 2022-05-27 DIAGNOSIS — R252 Cramp and spasm: Secondary | ICD-10-CM

## 2022-05-28 ENCOUNTER — Ambulatory Visit: Payer: PPO | Admitting: Family Medicine

## 2022-06-09 ENCOUNTER — Ambulatory Visit: Payer: PPO | Attending: Cardiovascular Disease | Admitting: Cardiovascular Disease

## 2022-06-09 ENCOUNTER — Encounter: Payer: Self-pay | Admitting: Cardiovascular Disease

## 2022-06-09 VITALS — BP 140/88 | HR 64 | Ht 68.0 in | Wt 168.0 lb

## 2022-06-09 DIAGNOSIS — I7143 Infrarenal abdominal aortic aneurysm, without rupture: Secondary | ICD-10-CM | POA: Diagnosis not present

## 2022-06-09 DIAGNOSIS — E782 Mixed hyperlipidemia: Secondary | ICD-10-CM

## 2022-06-09 DIAGNOSIS — I739 Peripheral vascular disease, unspecified: Secondary | ICD-10-CM | POA: Diagnosis not present

## 2022-06-09 DIAGNOSIS — I2102 ST elevation (STEMI) myocardial infarction involving left anterior descending coronary artery: Secondary | ICD-10-CM

## 2022-06-09 DIAGNOSIS — I2510 Atherosclerotic heart disease of native coronary artery without angina pectoris: Secondary | ICD-10-CM

## 2022-06-09 DIAGNOSIS — I10 Essential (primary) hypertension: Secondary | ICD-10-CM

## 2022-06-09 DIAGNOSIS — I5042 Chronic combined systolic (congestive) and diastolic (congestive) heart failure: Secondary | ICD-10-CM

## 2022-06-09 NOTE — Patient Instructions (Signed)
Medication Instructions:  Your physician recommends that you continue on your current medications as directed. Please refer to the Current Medication list given to you today.  *If you need a refill on your cardiac medications before your next appointment, please call your pharmacy*   Testing/Procedures: Your physician has requested that you have an echocardiogram. Echocardiography is a painless test that uses sound waves to create images of your heart. It provides your doctor with information about the size and shape of your heart and how well your heart's chambers and valves are working. This procedure takes approximately one hour. There are no restrictions for this procedure. Please do NOT wear cologne, perfume, aftershave, or lotions (deodorant is allowed). Please arrive 15 minutes prior to your appointment time. To be done in September.     Follow-Up: At [facility], you and your health needs are our priority.  As part of our continuing mission to provide you with exceptional heart care, we have created designated Provider Care Teams.  These Care Teams include your primary Cardiologist (physician) and Advanced Practice Providers (APPs -  Physician Assistants and Nurse Practitioners) who all work together to provide you with the care you need, when you need it.  We recommend signing up for the patient portal called "MyChart".  Sign up information is provided on this After Visit Summary.  MyChart is used to connect with patients for Virtual Visits (Telemedicine).  Patients are able to view lab/test results, encounter notes, upcoming appointments, etc.  Non-urgent messages can be sent to your provider as well.   To learn more about what you can do with MyChart, go to [email or web address].    Your next appointment:   6 month(s)  Provider:   Fabian Sharp, PA-C, Sande Rives, PA-C, Caron Presume, PA-C, Jory Sims, DNP, ANP, Almyra Deforest, PA-C, Diona Browner, NP, or Mayra Reel, NP      Then, Quay Burow, MD will plan to see you again in 12 month(s).

## 2022-06-09 NOTE — Assessment & Plan Note (Signed)
History of carotid artery disease status post right carotid endarterectomy performed by Dr. Trula Slade 11/27/2015 with carotid Dopplers performed 10/20/2021 revealing a widely patent endarterectomy site.  This will be repeated on an annual basis.

## 2022-06-09 NOTE — Assessment & Plan Note (Signed)
History of a small infrarenal abdominal aortic aneurysm measuring 4.5 cm by duplex ultrasound 10/10/2021.  This will be repeated on an annual basis.

## 2022-06-09 NOTE — Assessment & Plan Note (Signed)
History of combined systolic and diastolic heart failure with 2D echo that improved from 20 to 25% up to 40 to 45% 11/28/2021 on carvedilol and Entresto.  He is also on oral diuretic.  His weight is remained stable.  He has no signs or symptoms of heart failure at this time.

## 2022-06-09 NOTE — Progress Notes (Signed)
06/09/2022 [name] [name]   1934/07/30  [id number]  Primary Physician Kathyrn Drown, MD Primary Cardiologist: Lorretta Harp MD [name], [place], [name]  HPI:  [name] [name] is a 88 y.o.  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 I last saw him in the office 10/28/2021.  He is accompanied by daughter [name] today... He was admitted on 07/02/13 for Anterior STEMI. Presenting symptoms included severe epigastric burning pain/ indigestion and left arm pain. Initial troponin was > 20.0. He underwent emergent LHC and successful aspiration thrombectomy, PCI and stenting of proximal LAD by [name], via the right femoral artery . The overall LVEF was estimated at 50%, without wall motion abnormalities. He tolerated the procedure well and left the cath lab in stable condition. DAPT with ASA and Brilinta was initiated. He was also placed on a BB, ACE-I and statin. His post PCI course was w/o complication. He was discharged home on 07/05/13.   It should also be noted that at time of emergent LHC, he was found to also have significant peripheral vascular disease with an occluded left iliac and high-grade right common iliac artery stenosis, as well as a small AAA. The patient complained of bilateral LEE. [name] ordered for him to undergo bilateral LEAs. The study was performed in our office on 07/18/13. Findings are as follows: ABI 0.95 on Rt, 0.59 on Lt.the patient underwent angiography, diamondback orbital rotational arthrectomy, PTA and stenting of a highly calcified ostial right common iliac artery stenosis. He had excellent angiographic and clinical result. His Dopplers normalized to no longer has right lower extremity claudication. He wishes to proceed with attempt at percutaneous opacification of his left common iliac chronic total occlusion. He is not tolerating his Brilenta because of shortness of breath and we  subsequently transitioned to Plavix. He had a left brain stroke thought to be related to a watershed infarct from a hemodynamically significant right carotid stenosis in the setting of an occluded left carotid. He was evaluated by Dr. Erlinda Hong from [facility]. Dr. Erlinda Hong thought he would be a candidate for carotid stenting should he be found have a right internal carotid artery stenosis of greater than 70%.I intubated him on 05/15/14 revealing at most a 40-50% smooth right internal artery stenosis. I suspect his carotid ultrasound overestimated the degree of stenosis because the contralateral occlusion.  Since I saw him a year ago he's remained stable. He denies chest pain, shortness of breath or claudication. He does want to have a left total knee replacement. A pharmacologic Myoview stress test was intermediate risk with scar and peri-infarct ischemia. His EF is in the 45-55% range. Because of progressively worsening Doppler studies a CT scan scan was performed that showed progression of his right internal carotid artery stenosis compared to his prior CTA. He did have some right common carotid origin stenosis as well. He was neurologically asymptomatic. I did angiogram him approximately 18 months ago revealing at most 50-60% right ICA stenosis. I suspected that the Dopplers overestimated the degree of stenosis because of contralateral occlusion. Because of progression of disease he underwent angiography by myself 10/29/15 with the intent to perform right internal carotid artery stenting. Unfortunately, because of the degree of calcification and tortuosity and was never able to safely access the right common carotid artery and therefore aborted the procedure. The patient also underwent uncomplicated right carotid endarterectomy by Dr. Trula Slade  11/27/15 which he has nicely recovered from. Recent carotid Dopplers performed 01/15/16 revealed a widely patent right carotid endarterectomy site.   He was admitted  for 2 days in November with acute on chronic diastolic heart failure and was diuresed.  He was hypertensive and had chest pain at that time but MI was ruled out.    Since I saw him in the office 6 months ago he continues to do well.  He denies chest pain, shortness of breath or claudication.  He is on oral diuretics with minimal peripheral edema.  His weight remained stable.  His EF by 2D echo performed 11/28/2021 improved from 20 to 25% up to 40 to 45% on carvedilol and Entresto.  His lower extremity arterial Doppler studies performed 10/10/2021 revealed patent iliac stents and his carotid Dopplers revealed a patent right carotid endarterectomy site.   Current Meds  Medication Sig   acetaminophen (TYLENOL) 500 MG tablet Take 500 mg by mouth every 6 (six) hours as needed for mild pain or moderate pain.    atorvastatin (LIPITOR) 20 MG tablet TAKE 1 TABLET(20 MG) BY MOUTH DAILY   carvedilol (COREG) 6.25 MG tablet Take 1 tablet (6.25 mg total) by mouth 2 (two) times daily with a meal.   clopidogrel (PLAVIX) 75 MG tablet Take 1 tablet (75 mg total) by mouth daily.   furosemide (LASIX) 20 MG tablet TAKE 1 TABLET BY MOUTH EVERY DAY   ipratropium (ATROVENT) 0.03 % nasal spray Place 2 sprays into both nostrils every 12 (twelve) hours.   methylcellulose (ARTIFICIAL TEARS) 1 % ophthalmic solution Place 1 drop into both eyes daily.   nitroGLYCERIN (NITROSTAT) 0.4 MG SL tablet Place 1 tablet (0.4 mg total) under the tongue every 5 (five) minutes as needed for chest pain.   pantoprazole (PROTONIX) 20 MG tablet Take 1 tablet (20 mg total) by mouth 2 (two) times daily.   sacubitril-valsartan (ENTRESTO) 49-51 MG Take 1 tablet by mouth 2 (two) times daily.   tamsulosin (FLOMAX) 0.4 MG CAPS capsule TAKE 1 CAPSULE EACH EVENING   traMADol (ULTRAM) 50 MG tablet TAKE 1 TABLET BY MOUTH EVERY 8 HOURS AS NEEDED.     Allergies  Allergen Reactions   Brilinta [Ticagrelor] Shortness Of Breath and Other (See Comments)     VERTIGO, also   Influenza Vaccines Other (See Comments)    Pt states he has been hospitalized both times he was given flu vaccine as a younger adult while in the [employer] and they told him not to take it again   Fluoride Preparations Other (See Comments)    Reaction not recalled    Social History   Socioeconomic History   Marital status: Married    Spouse name: Not on file   Number of children: 3   Years of education: ASSOCIATES   Highest education level: Not on file  Occupational History   Occupation: Retired  Tobacco Use   Smoking status: Former    Packs/day: 0.50    Years: 7.00    Total pack years: 3.50    Types: Cigarettes   Smokeless tobacco: Never  Substance and Sexual Activity   Alcohol use: No    Alcohol/week: 0.0 standard drinks of alcohol   Drug use: No   Sexual activity: Not Currently  Other Topics Concern   Not on file  Social History Narrative   Patient is married with 3 children.   Patient is right handed.   Patient has his Associates degree.   Patient drinks 2-3 cups  daily.   Social Determinants of Health   Financial Resource Strain: Low Risk  (01/07/2022)   Overall Financial Resource Strain (CARDIA)    Difficulty of Paying Living Expenses: Not hard at all  Food Insecurity: No Food Insecurity (01/07/2022)   Hunger Vital Sign    Worried About Running Out of Food in the Last Year: Never true    Ran Out of Food in the Last Year: Never true  Transportation Needs: No Transportation Needs (01/07/2022)   PRAPARE - Hydrologist (Medical): No    Lack of Transportation (Non-Medical): No  Physical Activity: Insufficiently Active (01/07/2022)   Exercise Vital Sign    Days of Exercise per Week: 2 days    Minutes of Exercise per Session: 20 min  Stress: No Stress Concern Present (01/07/2022)   [place]    Feeling of Stress : Only a little  Social Connections:  Moderately Isolated (01/07/2022)   Social Connection and Isolation Panel [NHANES]    Frequency of Communication with Friends and Family: Twice a week    Frequency of Social Gatherings with Friends and Family: Twice a week    Attends Religious Services: Never    Marine scientist or Organizations: No    Attends Archivist Meetings: Never    Marital Status: Married  Human resources officer Violence: Not At Risk (01/07/2022)   Humiliation, Afraid, Rape, and Kick questionnaire    Fear of Current or Ex-Partner: No    Emotionally Abused: No    Physically Abused: No    Sexually Abused: No     Review of Systems: General: negative for chills, fever, night sweats or weight changes.  Cardiovascular: negative for chest pain, dyspnea on exertion, edema, orthopnea, palpitations, paroxysmal nocturnal dyspnea or shortness of breath Dermatological: negative for rash Respiratory: negative for cough or wheezing Urologic: negative for hematuria Abdominal: negative for nausea, vomiting, diarrhea, bright red blood per rectum, melena, or hematemesis Neurologic: negative for visual changes, syncope, or dizziness All other systems reviewed and are otherwise negative except as noted above.    Blood pressure (!) 140/88, pulse 64, height '5\' 8"'$  (1.727 m), weight 168 lb (76.2 kg), SpO2 95 %.  General appearance: alert and no distress Neck: no adenopathy, no carotid bruit, no JVD, supple, symmetrical, trachea midline, and thyroid not enlarged, symmetric, no tenderness/mass/nodules Lungs: clear to auscultation bilaterally Heart: regular rate and rhythm, S1, S2 normal, no murmur, click, rub or gallop Extremities: extremities normal, atraumatic, no cyanosis or edema Pulses: 2+ and symmetric Skin: Skin color, texture, turgor normal. No rashes or lesions Neurologic: Grossly normal  EKG sinus rhythm at 64 with left axis deviation, septal Q waves, inferior Q waves and lateral T wave inversion.  I personally  reviewed this EKG.  ASSESSMENT AND PLAN:   Hyperlipidemia History of hyperlipidemia on atorvastatin with lipid profile performed 01/19/2022 revealing a total cholesterol of 92, LDL 46 and HDL of 27.  Hypertension History of essential hypertension with blood pressure measured today at 140/88.  He is on carvedilol.  Occlusion and stenosis of carotid artery with cerebral infarction History of carotid artery disease status post right carotid endarterectomy performed by Dr. Trula Slade 11/27/2015 with carotid Dopplers performed 10/20/2021 revealing a widely patent endarterectomy site.  This will be repeated on an annual basis.  STEMI (ST elevation myocardial infarction) ([place]) History of CAD status post anterior STEMI  07/02/13 with aspiration thrombectomy, PCI and stenting of his proximal  LAD by myself via the right femoral approach.  He has remained stable from cardiac standpoint since that time.  I did however demonstrate significant PAD at the time of his coronary intervention.  Peripheral vascular disease ([place]) History of PE NAD status post orbital atherectomy, PTA and stenting of a highly calcified ostial right common iliac artery by myself with an excellent result.  His most recent Doppler studies reveal a patent right iliac and left iliac stent.  He denies claudication.  AAA (abdominal aortic aneurysm) ([place]) History of a small infrarenal abdominal aortic aneurysm measuring 4.5 cm by duplex ultrasound 10/10/2021.  This will be repeated on an annual basis.  CHF (congestive heart failure) ([facility]) History of combined systolic and diastolic heart failure with 2D echo that improved from 20 to 25% up to 40 to 45% 11/28/2021 on carvedilol and Entresto.  He is also on oral diuretic.  His weight is remained stable.  He has no signs or symptoms of heart failure at this time.     Lorretta Harp MD FACP,FACC,FAHA, [facility] 06/09/2022 9:48 AM

## 2022-06-09 NOTE — Assessment & Plan Note (Signed)
History of hyperlipidemia on atorvastatin with lipid profile performed 01/19/2022 revealing a total cholesterol of 92, LDL 46 and HDL of 27.

## 2022-06-09 NOTE — Assessment & Plan Note (Signed)
History of essential hypertension with blood pressure measured today at 140/88.  He is on carvedilol.

## 2022-06-09 NOTE — Assessment & Plan Note (Signed)
History of PE NAD status post orbital atherectomy, PTA and stenting of a highly calcified ostial right common iliac artery by myself with an excellent result.  His most recent Doppler studies reveal a patent right iliac and left iliac stent.  He denies claudication.

## 2022-06-09 NOTE — Assessment & Plan Note (Signed)
History of CAD status post anterior STEMI  07/02/13 with aspiration thrombectomy, PCI and stenting of his proximal LAD by myself via the right femoral approach.  He has remained stable from cardiac standpoint since that time.  I did however demonstrate significant PAD at the time of his coronary intervention.

## 2022-06-10 ENCOUNTER — Ambulatory Visit: Payer: PPO | Attending: Family Medicine

## 2022-06-10 DIAGNOSIS — R262 Difficulty in walking, not elsewhere classified: Secondary | ICD-10-CM | POA: Diagnosis not present

## 2022-06-10 DIAGNOSIS — R2681 Unsteadiness on feet: Secondary | ICD-10-CM | POA: Insufficient documentation

## 2022-06-10 DIAGNOSIS — R252 Cramp and spasm: Secondary | ICD-10-CM | POA: Insufficient documentation

## 2022-06-10 DIAGNOSIS — R2689 Other abnormalities of gait and mobility: Secondary | ICD-10-CM | POA: Insufficient documentation

## 2022-06-10 DIAGNOSIS — R293 Abnormal posture: Secondary | ICD-10-CM | POA: Insufficient documentation

## 2022-06-10 DIAGNOSIS — M6281 Muscle weakness (generalized): Secondary | ICD-10-CM | POA: Diagnosis not present
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
## 2022-06-12 ENCOUNTER — Ambulatory Visit: Payer: PPO | Admitting: Physical Therapy

## 2022-06-12 ENCOUNTER — Other Ambulatory Visit: Payer: Self-pay | Admitting: Family Medicine

## 2022-06-16 ENCOUNTER — Ambulatory Visit: Payer: PPO | Admitting: Physical Therapy

## 2022-06-16 ENCOUNTER — Encounter: Payer: Self-pay | Admitting: Physical Therapy

## 2022-06-16 DIAGNOSIS — R252 Cramp and spasm: Secondary | ICD-10-CM

## 2022-06-16 DIAGNOSIS — R2689 Other abnormalities of gait and mobility: Secondary | ICD-10-CM

## 2022-06-16 DIAGNOSIS — R2681 Unsteadiness on feet: Secondary | ICD-10-CM

## 2022-06-16 DIAGNOSIS — M6281 Muscle weakness (generalized): Secondary | ICD-10-CM

## 2022-06-16 DIAGNOSIS — R293 Abnormal posture: Secondary | ICD-10-CM

## 2022-06-16 DIAGNOSIS — R262 Difficulty in walking, not elsewhere classified: Secondary | ICD-10-CM
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
## 2022-06-16 NOTE — Telephone Encounter (Signed)
Please notify the pharmacy that the patient is no longer on this medicine-patient connect with Dr. Roderic Palau berry if they feel that this medicine still needs to be utilized

## 2022-06-19 ENCOUNTER — Ambulatory Visit (INDEPENDENT_AMBULATORY_CARE_PROVIDER_SITE_OTHER): Payer: PPO | Admitting: Family Medicine

## 2022-06-19 VITALS — BP 122/82 | Ht 68.0 in

## 2022-06-19 DIAGNOSIS — M2550 Pain in unspecified joint: Secondary | ICD-10-CM | POA: Diagnosis not present

## 2022-06-19 DIAGNOSIS — M542 Cervicalgia: Secondary | ICD-10-CM | POA: Diagnosis not present

## 2022-06-19 DIAGNOSIS — H6120 Impacted cerumen, unspecified ear: Secondary | ICD-10-CM

## 2022-06-19 DIAGNOSIS — N1832 Chronic kidney disease, stage 3b: Secondary | ICD-10-CM | POA: Insufficient documentation

## 2022-06-19 DIAGNOSIS — D696 Thrombocytopenia, unspecified: Secondary | ICD-10-CM

## 2022-06-19 DIAGNOSIS — N183 Chronic kidney disease, stage 3 unspecified: Secondary | ICD-10-CM | POA: Diagnosis not present
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
## 2022-06-23 ENCOUNTER — Ambulatory Visit: Payer: PPO

## 2022-06-23 DIAGNOSIS — M6281 Muscle weakness (generalized): Secondary | ICD-10-CM

## 2022-06-23 DIAGNOSIS — R252 Cramp and spasm: Secondary | ICD-10-CM

## 2022-06-23 DIAGNOSIS — R2681 Unsteadiness on feet: Secondary | ICD-10-CM | POA: Diagnosis not present

## 2022-06-23 DIAGNOSIS — R293 Abnormal posture: Secondary | ICD-10-CM

## 2022-06-23 DIAGNOSIS — R262 Difficulty in walking, not elsewhere classified: Secondary | ICD-10-CM
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
## 2022-06-24 IMAGING — DX DG CHEST 2V
2 series · 2 of 2 positions shown · non-contrast
Comparison: Previous studies including the chest radiograph done on
08/15/2020 and CT chest done on 11/22/2020

CLINICAL DATA: Shortness of breath

EXAM:
CHEST - 2 VIEW

[chest pa]
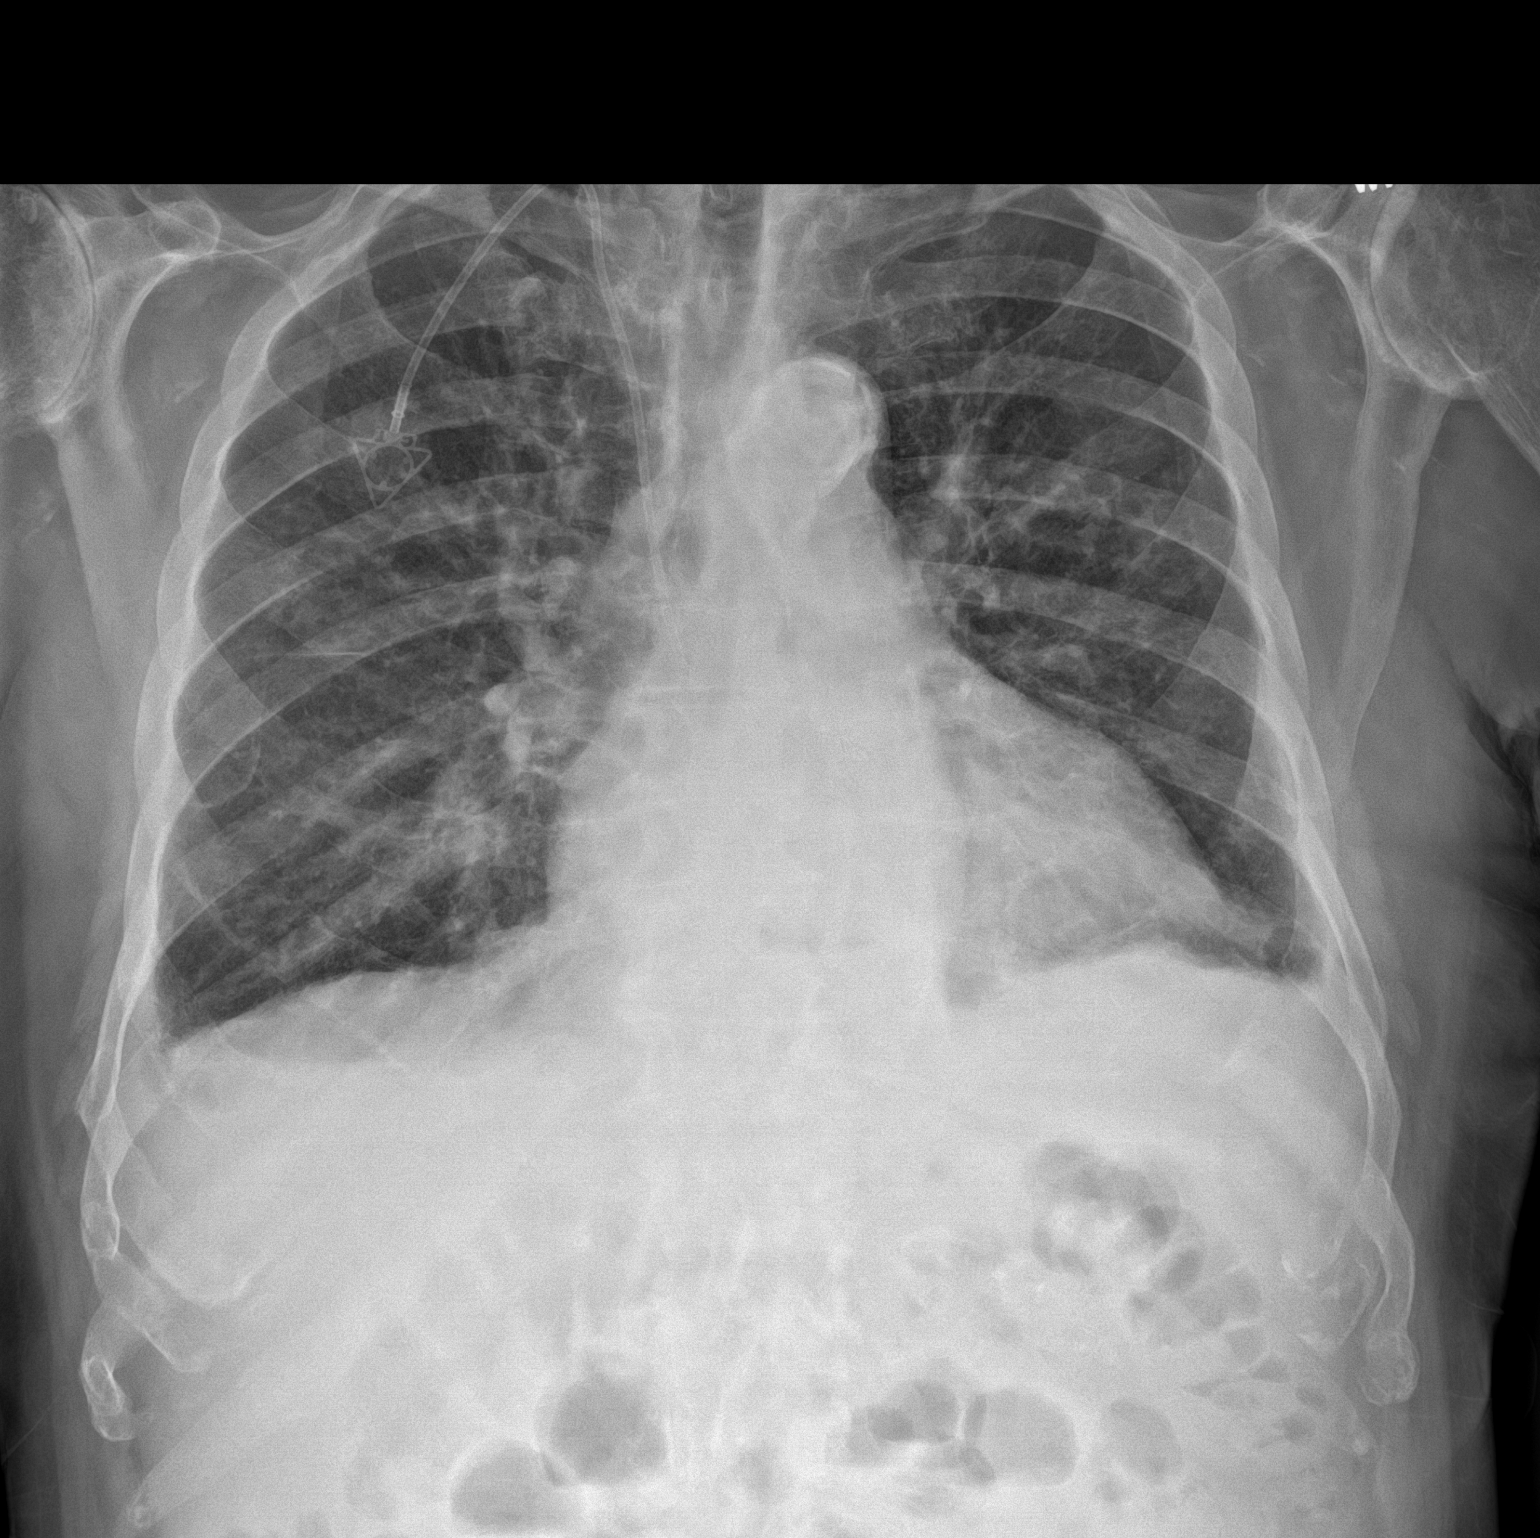

[chest lat]
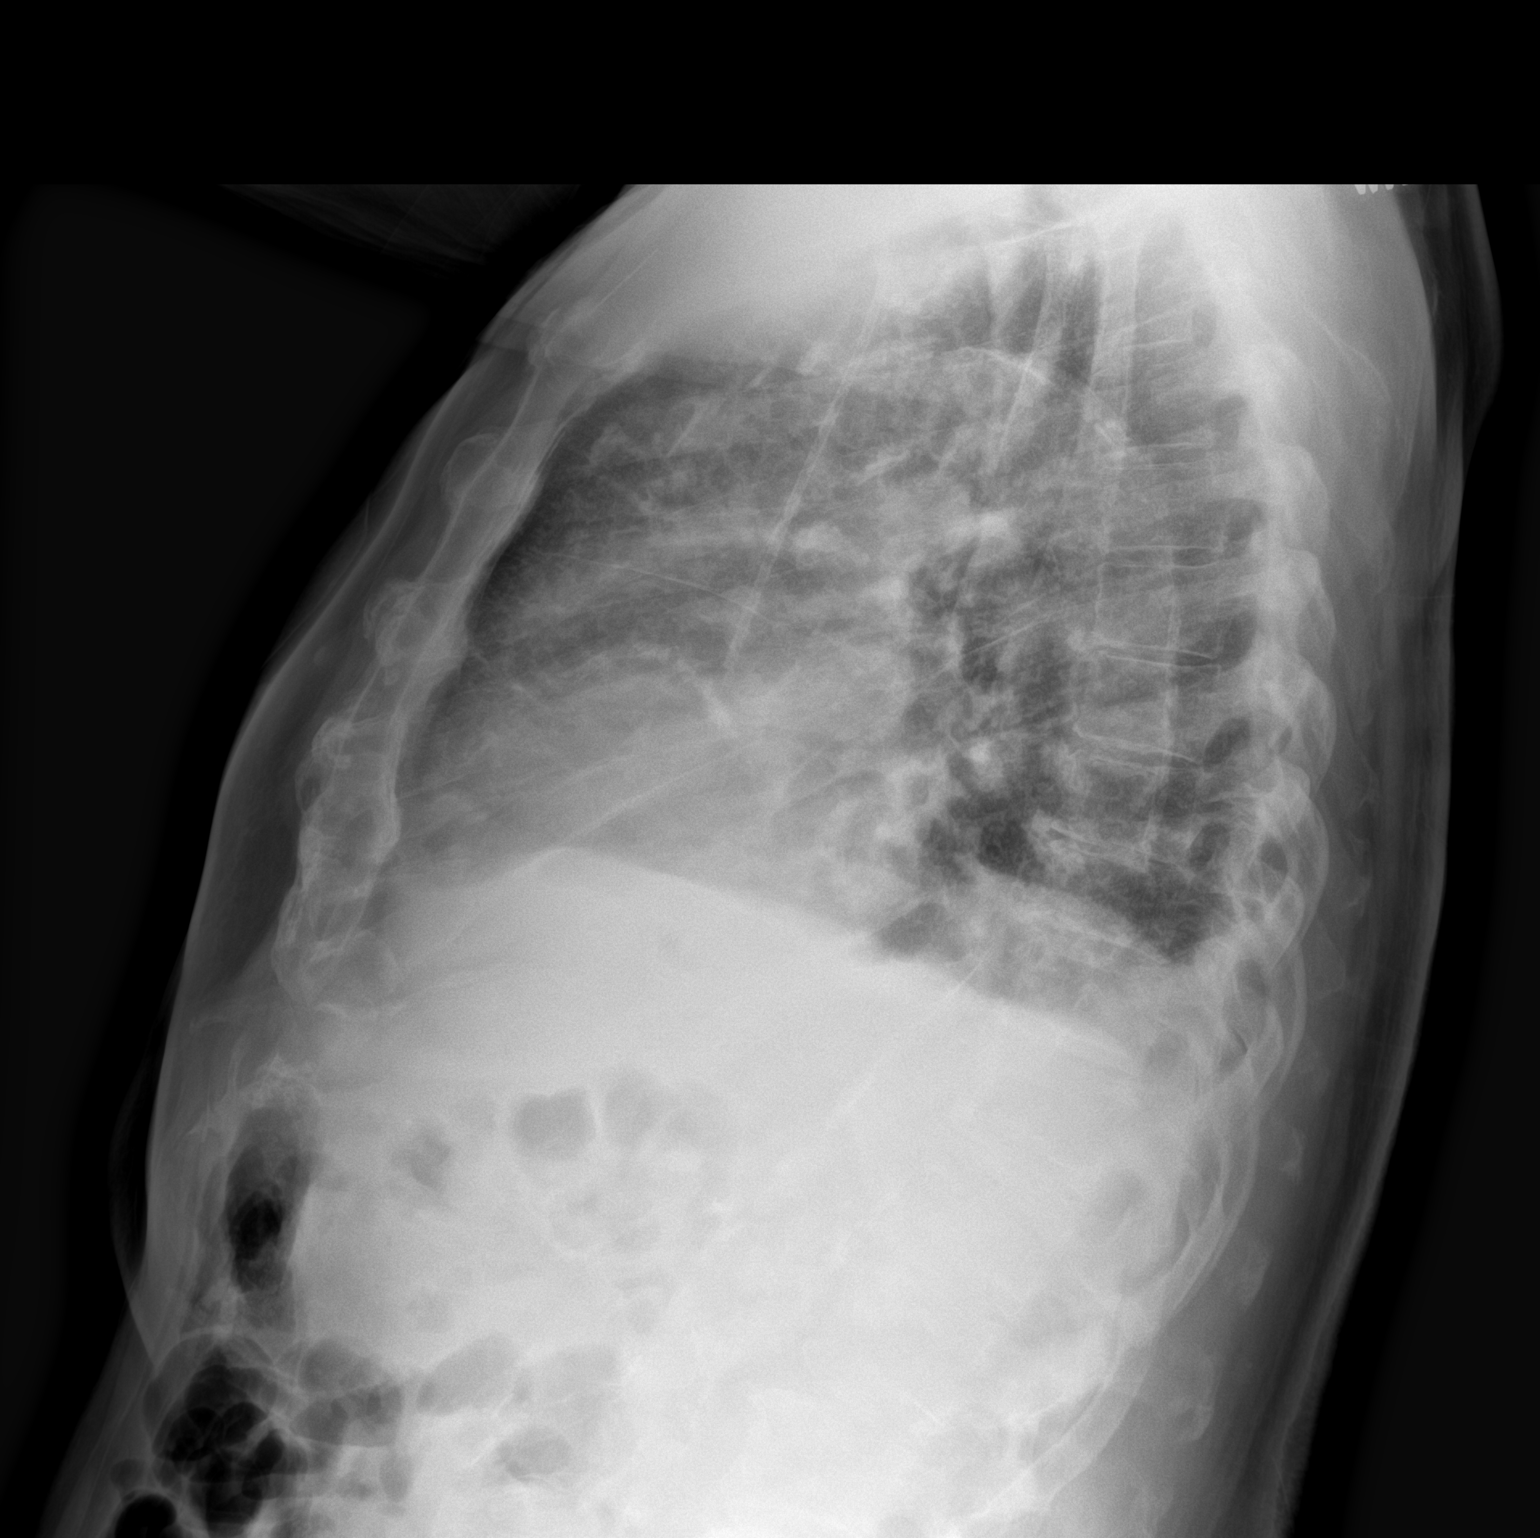

[2 of 2 positions shown; findings below may reference images not displayed]

FINDINGS: Transverse diameter of heart is increased. Central pulmonary vessels
are more prominent. There is increase in interstitial markings in
the parahilar regions and lower lung fields. There is blunting of
both lateral CP angles. There is no pneumothorax. Tip of right IJ
chest port is seen in the superior vena cava.
IMPRESSION: Cardiomegaly. Central pulmonary vessels are more prominent
suggesting CHF. There is increase in interstitial markings in the
parahilar regions and lower lung fields which may suggest
interstitial pulmonary edema or interstitial pneumonia. Small
bilateral pleural effusions are seen.

## 2022-06-25 ENCOUNTER — Ambulatory Visit: Payer: PPO

## 2022-06-25 DIAGNOSIS — M6281 Muscle weakness (generalized): Secondary | ICD-10-CM

## 2022-06-25 DIAGNOSIS — R252 Cramp and spasm: Secondary | ICD-10-CM

## 2022-06-25 DIAGNOSIS — R2681 Unsteadiness on feet: Secondary | ICD-10-CM

## 2022-06-25 DIAGNOSIS — R293 Abnormal posture: Secondary | ICD-10-CM

## 2022-06-25 DIAGNOSIS — R262 Difficulty in walking, not elsewhere classified: Secondary | ICD-10-CM
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
## 2022-06-30 ENCOUNTER — Ambulatory Visit: Payer: PPO | Attending: Family Medicine

## 2022-06-30 DIAGNOSIS — R2681 Unsteadiness on feet: Secondary | ICD-10-CM

## 2022-06-30 DIAGNOSIS — R252 Cramp and spasm: Secondary | ICD-10-CM | POA: Insufficient documentation

## 2022-06-30 DIAGNOSIS — M6281 Muscle weakness (generalized): Secondary | ICD-10-CM | POA: Diagnosis not present

## 2022-06-30 DIAGNOSIS — R262 Difficulty in walking, not elsewhere classified: Secondary | ICD-10-CM | POA: Diagnosis not present

## 2022-06-30 DIAGNOSIS — R293 Abnormal posture: Secondary | ICD-10-CM | POA: Diagnosis not present
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
## 2022-07-02 ENCOUNTER — Ambulatory Visit: Payer: PPO

## 2022-07-07 ENCOUNTER — Ambulatory Visit: Payer: PPO

## 2022-07-07 DIAGNOSIS — R293 Abnormal posture: Secondary | ICD-10-CM

## 2022-07-07 DIAGNOSIS — R2681 Unsteadiness on feet: Secondary | ICD-10-CM | POA: Diagnosis not present

## 2022-07-07 DIAGNOSIS — M6281 Muscle weakness (generalized): Secondary | ICD-10-CM

## 2022-07-07 DIAGNOSIS — R252 Cramp and spasm: Secondary | ICD-10-CM

## 2022-07-07 DIAGNOSIS — R262 Difficulty in walking, not elsewhere classified: Secondary | ICD-10-CM
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
## 2022-07-09 ENCOUNTER — Ambulatory Visit: Payer: PPO

## 2022-07-09 DIAGNOSIS — R2681 Unsteadiness on feet: Secondary | ICD-10-CM

## 2022-07-09 DIAGNOSIS — M6281 Muscle weakness (generalized): Secondary | ICD-10-CM

## 2022-07-09 DIAGNOSIS — R252 Cramp and spasm: Secondary | ICD-10-CM

## 2022-07-09 DIAGNOSIS — R262 Difficulty in walking, not elsewhere classified: Secondary | ICD-10-CM
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
## 2022-07-14 ENCOUNTER — Ambulatory Visit: Payer: PPO

## 2022-07-14 DIAGNOSIS — R252 Cramp and spasm: Secondary | ICD-10-CM

## 2022-07-14 DIAGNOSIS — R2681 Unsteadiness on feet: Secondary | ICD-10-CM

## 2022-07-14 DIAGNOSIS — M6281 Muscle weakness (generalized): Secondary | ICD-10-CM

## 2022-07-14 DIAGNOSIS — R262 Difficulty in walking, not elsewhere classified: Secondary | ICD-10-CM

## 2022-07-14 DIAGNOSIS — R293 Abnormal posture: Secondary | ICD-10-CM
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
## 2022-07-21 ENCOUNTER — Ambulatory Visit: Payer: PPO | Admitting: Physical Therapy

## 2022-07-21 ENCOUNTER — Encounter: Payer: Self-pay | Admitting: Physical Therapy

## 2022-07-21 DIAGNOSIS — R262 Difficulty in walking, not elsewhere classified: Secondary | ICD-10-CM

## 2022-07-21 DIAGNOSIS — M6281 Muscle weakness (generalized): Secondary | ICD-10-CM

## 2022-07-21 DIAGNOSIS — R2681 Unsteadiness on feet: Secondary | ICD-10-CM
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
## 2022-07-24 ENCOUNTER — Other Ambulatory Visit: Payer: Self-pay | Admitting: Family Medicine

## 2022-07-27 DIAGNOSIS — H905 Unspecified sensorineural hearing loss: Secondary | ICD-10-CM | POA: Diagnosis not present

## 2022-07-27 DIAGNOSIS — H6123 Impacted cerumen, bilateral: Secondary | ICD-10-CM | POA: Diagnosis not present

## 2022-07-29 ENCOUNTER — Ambulatory Visit: Payer: PPO | Attending: Family Medicine

## 2022-07-29 DIAGNOSIS — R293 Abnormal posture: Secondary | ICD-10-CM

## 2022-07-29 DIAGNOSIS — M25562 Pain in left knee: Secondary | ICD-10-CM | POA: Diagnosis not present

## 2022-07-29 DIAGNOSIS — R2681 Unsteadiness on feet: Secondary | ICD-10-CM | POA: Diagnosis not present

## 2022-07-29 DIAGNOSIS — R262 Difficulty in walking, not elsewhere classified: Secondary | ICD-10-CM | POA: Diagnosis not present

## 2022-07-29 DIAGNOSIS — M6281 Muscle weakness (generalized): Secondary | ICD-10-CM

## 2022-07-29 DIAGNOSIS — R252 Cramp and spasm: Secondary | ICD-10-CM | POA: Diagnosis not present
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
## 2022-08-05 ENCOUNTER — Ambulatory Visit: Payer: PPO

## 2022-08-05 DIAGNOSIS — R2681 Unsteadiness on feet: Secondary | ICD-10-CM

## 2022-08-05 DIAGNOSIS — R262 Difficulty in walking, not elsewhere classified: Secondary | ICD-10-CM

## 2022-08-05 DIAGNOSIS — M6281 Muscle weakness (generalized): Secondary | ICD-10-CM

## 2022-08-05 DIAGNOSIS — R252 Cramp and spasm: Secondary | ICD-10-CM
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
## 2022-08-10 ENCOUNTER — Other Ambulatory Visit: Payer: Self-pay | Admitting: Family Medicine

## 2022-08-11 ENCOUNTER — Other Ambulatory Visit: Payer: Self-pay

## 2022-08-11 MED ORDER — CLOPIDOGREL BISULFATE 75 MG PO TABS: 75.0000 mg | ORAL_TABLET | Freq: Every day | ORAL | 3 refills | Status: DC

## 2022-08-13 ENCOUNTER — Ambulatory Visit: Payer: PPO

## 2022-08-13 DIAGNOSIS — R262 Difficulty in walking, not elsewhere classified: Secondary | ICD-10-CM

## 2022-08-13 DIAGNOSIS — R2681 Unsteadiness on feet: Secondary | ICD-10-CM | POA: Diagnosis not present

## 2022-08-13 DIAGNOSIS — R252 Cramp and spasm: Secondary | ICD-10-CM

## 2022-08-13 DIAGNOSIS — R293 Abnormal posture: Secondary | ICD-10-CM

## 2022-08-13 DIAGNOSIS — M6281 Muscle weakness (generalized): Secondary | ICD-10-CM
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
## 2022-08-14 ENCOUNTER — Other Ambulatory Visit: Payer: Self-pay | Admitting: Family Medicine

## 2022-08-14 ENCOUNTER — Other Ambulatory Visit: Payer: Self-pay | Admitting: Cardiovascular Disease

## 2022-08-18 ENCOUNTER — Inpatient Hospital Stay: Payer: PPO | Admitting: Oncology

## 2022-08-18 ENCOUNTER — Inpatient Hospital Stay: Payer: PPO | Attending: Oncology

## 2022-08-18 VITALS — BP 138/86 | HR 60 | Temp 98.1°F | Resp 18 | Ht 68.0 in | Wt 157.6 lb

## 2022-08-18 DIAGNOSIS — C8510 Unspecified B-cell lymphoma, unspecified site: Secondary | ICD-10-CM | POA: Insufficient documentation

## 2022-08-18 DIAGNOSIS — I739 Peripheral vascular disease, unspecified: Secondary | ICD-10-CM | POA: Diagnosis not present

## 2022-08-18 DIAGNOSIS — I509 Heart failure, unspecified: Secondary | ICD-10-CM | POA: Insufficient documentation

## 2022-08-18 DIAGNOSIS — I110 Hypertensive heart disease with heart failure: Secondary | ICD-10-CM | POA: Diagnosis not present

## 2022-08-18 DIAGNOSIS — D696 Thrombocytopenia, unspecified: Secondary | ICD-10-CM | POA: Diagnosis not present

## 2022-08-18 DIAGNOSIS — D509 Iron deficiency anemia, unspecified: Secondary | ICD-10-CM | POA: Insufficient documentation

## 2022-08-18 LAB — CMP (CANCER CENTER ONLY)
ALT: 5 U/L (ref 0–44)
AST: 10 U/L — ABNORMAL LOW (ref 15–41)
Albumin: 4.1 g/dL (ref 3.5–5.0)
Alkaline Phosphatase: 50 U/L (ref 38–126)
Anion gap: 9 (ref 5–15)
BUN: 61 mg/dL — ABNORMAL HIGH (ref 8–23)
CO2: 21 mmol/L — ABNORMAL LOW (ref 22–32)
Calcium: 8.7 mg/dL — ABNORMAL LOW (ref 8.9–10.3)
Chloride: 108 mmol/L (ref 98–111)
Creatinine: 1.88 mg/dL — ABNORMAL HIGH (ref 0.61–1.24)
GFR, Estimated: 35 mL/min — ABNORMAL LOW (ref >60)
Glucose, Bld: 118 mg/dL — ABNORMAL HIGH (ref 70–99)
Potassium: 4.9 mmol/L (ref 3.5–5.1)
Sodium: 138 mmol/L (ref 135–145)
Total Bilirubin: 0.3 mg/dL (ref 0.3–1.2)
Total Protein: 6.3 g/dL — ABNORMAL LOW (ref 6.5–8.1)

## 2022-08-18 LAB — CBC WITH DIFFERENTIAL (CANCER CENTER ONLY)
Abs Immature Granulocytes: 0.03 10*3/uL (ref 0.00–0.07)
Basophils Absolute: 0 10*3/uL (ref 0.0–0.1)
Basophils Relative: 1 %
Eosinophils Absolute: 0.2 10*3/uL (ref 0.0–0.5)
Eosinophils Relative: 2 %
HCT: 38.9 % — ABNORMAL LOW (ref 39.0–52.0)
Hemoglobin: 12.4 g/dL — ABNORMAL LOW (ref 13.0–17.0)
Immature Granulocytes: 0 %
Lymphocytes Relative: 9 %
Lymphs Abs: 0.7 10*3/uL (ref 0.7–4.0)
MCH: 26.3 pg (ref 26.0–34.0)
MCHC: 31.9 g/dL (ref 30.0–36.0)
MCV: 82.6 fL (ref 80.0–100.0)
Monocytes Absolute: 0.5 10*3/uL (ref 0.1–1.0)
Monocytes Relative: 6 %
Neutro Abs: 5.9 10*3/uL (ref 1.7–7.7)
Neutrophils Relative %: 82 %
Platelet Count: 87 10*3/uL — ABNORMAL LOW (ref 150–400)
RBC: 4.71 MIL/uL (ref 4.22–5.81)
RDW: 18 % — ABNORMAL HIGH (ref 11.5–15.5)
WBC Count: 7.3 10*3/uL (ref 4.0–10.5)
nRBC: 0 % (ref 0.0–0.2)

## 2022-08-18 LAB — LACTATE DEHYDROGENASE: LDH: 165 U/L (ref 98–192)
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
## 2022-08-19 ENCOUNTER — Ambulatory Visit: Payer: PPO

## 2022-08-19 DIAGNOSIS — M6281 Muscle weakness (generalized): Secondary | ICD-10-CM

## 2022-08-19 DIAGNOSIS — R252 Cramp and spasm: Secondary | ICD-10-CM

## 2022-08-19 DIAGNOSIS — R293 Abnormal posture: Secondary | ICD-10-CM

## 2022-08-19 DIAGNOSIS — R2681 Unsteadiness on feet: Secondary | ICD-10-CM

## 2022-08-19 DIAGNOSIS — M25562 Pain in left knee: Secondary | ICD-10-CM

## 2022-08-19 DIAGNOSIS — R262 Difficulty in walking, not elsewhere classified: Secondary | ICD-10-CM
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
## 2022-08-21 ENCOUNTER — Other Ambulatory Visit: Payer: Self-pay | Admitting: Cardiovascular Disease

## 2022-08-31 ENCOUNTER — Telehealth: Payer: Self-pay | Admitting: Family Medicine

## 2022-08-31 DIAGNOSIS — E782 Mixed hyperlipidemia: Secondary | ICD-10-CM

## 2022-08-31 NOTE — Telephone Encounter (Signed)
Blood work ordered in [employer]. Wife Barbara(DPR) notified.

## 2022-08-31 NOTE — Telephone Encounter (Signed)
Patient  has appointment on 6/20 and needing labs done would like to do this week if possible.

## 2022-08-31 NOTE — Telephone Encounter (Signed)
Please note-the cancer center does significant blood work on a regular basis The only test we need from our perspective is a lipid profile due to hyperlipidemia otherwise no additional blood work

## 2022-09-01 DIAGNOSIS — E782 Mixed hyperlipidemia: Secondary | ICD-10-CM | POA: Diagnosis not present

## 2022-09-02 LAB — LIPID PANEL
Chol/HDL Ratio: 2.4 ratio (ref 0.0–5.0)
Cholesterol, Total: 83 mg/dL — ABNORMAL LOW (ref 100–199)
HDL: 35 mg/dL — ABNORMAL LOW (ref >39)
LDL Chol Calc (NIH): 31 mg/dL (ref 0–99)
Triglycerides: 86 mg/dL (ref 0–149)
VLDL Cholesterol Cal: 17 mg/dL (ref 5–40)

## 2022-09-08 ENCOUNTER — Ambulatory Visit: Payer: PPO | Attending: Family Medicine

## 2022-09-08 DIAGNOSIS — M6281 Muscle weakness (generalized): Secondary | ICD-10-CM | POA: Diagnosis not present

## 2022-09-08 DIAGNOSIS — R262 Difficulty in walking, not elsewhere classified: Secondary | ICD-10-CM | POA: Diagnosis not present

## 2022-09-08 DIAGNOSIS — R2681 Unsteadiness on feet: Secondary | ICD-10-CM | POA: Insufficient documentation

## 2022-09-08 DIAGNOSIS — R252 Cramp and spasm: Secondary | ICD-10-CM | POA: Insufficient documentation

## 2022-09-08 DIAGNOSIS — M25562 Pain in left knee: Secondary | ICD-10-CM | POA: Insufficient documentation
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
## 2022-09-17 ENCOUNTER — Ambulatory Visit (INDEPENDENT_AMBULATORY_CARE_PROVIDER_SITE_OTHER): Payer: PPO | Admitting: Family Medicine

## 2022-09-17 VITALS — BP 148/78 | HR 96 | Wt 161.6 lb

## 2022-09-17 DIAGNOSIS — D696 Thrombocytopenia, unspecified: Secondary | ICD-10-CM | POA: Diagnosis not present

## 2022-09-17 DIAGNOSIS — E782 Mixed hyperlipidemia: Secondary | ICD-10-CM

## 2022-09-17 DIAGNOSIS — I951 Orthostatic hypotension: Secondary | ICD-10-CM

## 2022-09-17 DIAGNOSIS — N183 Chronic kidney disease, stage 3 unspecified: Secondary | ICD-10-CM | POA: Diagnosis not present

## 2022-09-17 DIAGNOSIS — M542 Cervicalgia: Secondary | ICD-10-CM | POA: Diagnosis not present

## 2022-09-17 DIAGNOSIS — M2550 Pain in unspecified joint: Secondary | ICD-10-CM

## 2022-09-17 DIAGNOSIS — R634 Abnormal weight loss: Secondary | ICD-10-CM | POA: Diagnosis not present

## 2022-09-17 NOTE — Progress Notes (Signed)
Subjective:    Patient ID: [name] [name], male    DOB: 1934/07/23, 88 y.o.   MRN: [id number]  HPI  Patient arrives for 3 month follow up. Patient states no concerns or issues today.  Patient complains of a lot of pain and stiffness aching with movement.  This is been persistent for him for quite many years  This patient was seen today for chronic pain  The medication list was reviewed and updated.   Location of Pain for which the patient has been treated with regarding narcotics: Primarily in his neck and back but to some degree in his hands and knees  Onset of this pain: Been present for years   -Compliance with medication: Good compliance with medicine  - Number patient states they take daily: Takes tramadol maximum 3/day  -Reason for ongoing use of opioids denies abusing the medicine.  Cannot take NSAIDs.  Has significant pain and discomfort which causes serious decline in quality of health.  Even with Tylenol still had severe pain  What other measures have been tried outside of opioids Tylenol, OTC creams, gentle range of motion  In the ongoing specialists regarding this condition none  -when was the last dose patient took?  Today  The patient was advised the importance of maintaining medication and not using illegal substances with these.  Here for refills and follow up  The patient was educated that we can provide 3 monthly scripts for their medication, it is their responsibility to follow the instructions.  Side effects or complications from medications: Denies side effects  Patient is aware that pain medications are meant to minimize the severity of the pain to allow their pain levels to improve to allow for better function. They are aware of that pain medications cannot totally remove their pain.  Due for UDT ( at least once per year) (pain management contract is also completed at the time of the UDT): We do not do urine drug test for this  Scale of 1 to 10 (  1 is least 10 is most) Your pain level without the medicine: 8 Your pain level with medication 5  Scale 1 to 10 ( 1-helps very little, 10 helps very well) How well does your pain medication reduce your pain so you can function better through out the day? 8  Quality of the pain: Aching stiffness discomfort  Persistence of the pain: Present all the time  Modifying factors: Worse with activity  Past Medical History:  Diagnosis Date   Arthritis    "all over" (09/04/2013)   Bleeds easily (HCC)    d/t being on Plavix and ASA per pt   CAD (coronary artery disease) 2015   a. STEMI 2002 s/p stent to PDA. b. Anterior STEMI 06/2013 s/p  asp thrombectomy, DES to prox LAD, EF preserved.   Carotid artery disease (HCC) 10/2013    Total occlusion of the LICA, 60-79^ stenosis of the RICA   Enlarged prostate    GERD (gastroesophageal reflux disease)    takes Pantoprazole daily   Hard of hearing    no hearing aides   History of colon polyps    benign   History of diverticulitis    History of shingles    Hyperlipidemia    takes Pravastatin daily   Hypertension    takes Amlodipine and Metoprolol daily   Joint pain    Joint swelling    Myocardial infarction [facility]) at age 77 and other 07/05/11   Nocturia  PVD (peripheral vascular disease) with claudication (HCC) 2015   a. 08/2013: s/p diamondback orbital rotational atherectomy and 8 mm x 30 mm long ICast covered stent to calcified ostial right common iliac artery. b. 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   Stroke [facility]) ~ 2012    x 2:right side is weaker   Thin skin    Thrombocytopenia (HCC)    Urinary frequency     Outpatient Encounter Medications as of 09/17/2022  Medication Sig Note   acetaminophen (TYLENOL) 500 MG tablet Take 500 mg by mouth every 6 (six) hours as needed for mild pain or moderate pain.     atorvastatin (LIPITOR) 20 MG tablet TAKE 1 TABLET(20 MG) BY MOUTH DAILY     carvedilol (COREG) 6.25 MG tablet TAKE 1 TABLET BY MOUTH 2 TIMES DAILY WITH A MEAL.    clopidogrel (PLAVIX) 75 MG tablet Take 1 tablet (75 mg total) by mouth daily.    furosemide (LASIX) 20 MG tablet TAKE 1 TABLET BY MOUTH EVERY DAY    ipratropium (ATROVENT) 0.03 % nasal spray Place 2 sprays into both nostrils every 12 (twelve) hours.    methylcellulose (ARTIFICIAL TEARS) 1 % ophthalmic solution Place 1 drop into both eyes daily.    nitroGLYCERIN (NITROSTAT) 0.4 MG SL tablet Place 1 tablet (0.4 mg total) under the tongue every 5 (five) minutes as needed for chest pain. 08/26/2021: Patient has on hand   pantoprazole (PROTONIX) 20 MG tablet TAKE 1 TABLET BY MOUTH TWICE A DAY    sacubitril-valsartan (ENTRESTO) 49-51 MG Take 1 tablet by mouth 2 (two) times daily.    tamsulosin (FLOMAX) 0.4 MG CAPS capsule TAKE 1 CAPSULE EACH EVENING    traMADol (ULTRAM) 50 MG tablet TAKE 1 TABLET BY MOUTH EVERY 8 HOURS AS NEEDED    No facility-administered encounter medications on file as of 09/17/2022.    Mixed hyperlipidemia  Stage 3 chronic kidney disease, unspecified whether stage 3a or 3b CKD (HCC)  Cervical pain  Thrombocytopenia, unspecified (HCC)  Arthralgia, unspecified joint  Weight loss  Orthostasis       Review of Systems     Objective:   Physical Exam  General-in no acute distress Eyes-no discharge Lungs-respiratory rate normal, CTA CV-no murmurs,RRR Extremities skin warm dry no edema Neuro grossly normal Behavior normal, alert       Assessment & Plan:  1. Mixed hyperlipidemia Healthy diet continue activity because of his underlying cardiovascular disease continue atorvastatin  2. Stage 3 chronic kidney disease, unspecified whether stage 3a or 3b CKD (HCC) Monitor closely healthy diet recommended  3. Cervical pain T tramadol maximum 3 times daily patient tolerates this medicine well seems to help him  4. Thrombocytopenia, unspecified (HCC) Lab work monitored through  oncology  5. Arthralgia, unspecified joint Osteoarthritis continue tramadol  6. Weight loss Concerning for the weight loss but his appetite he states is less than what it used to be we talked about ways to try to improve calorie intake monitor closely check labs before next follow-up visit  7. Orthostasis Blood pressure does drop when he stands I would not recommend any aggressive measures to bring your blood pressure down Follow-up 3 months

## 2022-09-22 ENCOUNTER — Ambulatory Visit: Payer: PPO | Admitting: Family Medicine

## 2022-10-07 ENCOUNTER — Ambulatory Visit (HOSPITAL_COMMUNITY)
Admission: RE | Admit: 2022-10-07 | Discharge: 2022-10-07 | Disposition: A | Discharge status: Home / Self Care | Payer: PPO | Source: Ambulatory Visit | Attending: Cardiology | Admitting: Cardiology

## 2022-10-07 ENCOUNTER — Ambulatory Visit (HOSPITAL_COMMUNITY)
Admission: RE | Admit: 2022-10-07 | Discharge: 2022-10-07 | Disposition: A | Discharge status: Home / Self Care | Payer: PPO | Source: Ambulatory Visit | Attending: Cardiovascular Disease | Admitting: Cardiovascular Disease

## 2022-10-07 ENCOUNTER — Ambulatory Visit (HOSPITAL_BASED_OUTPATIENT_CLINIC_OR_DEPARTMENT_OTHER)
Admission: RE | Admit: 2022-10-07 | Discharge: 2022-10-07 | Disposition: A | Discharge status: Home / Self Care | Payer: PPO | Source: Ambulatory Visit | Attending: Cardiovascular Disease | Admitting: Cardiovascular Disease

## 2022-10-07 DIAGNOSIS — I779 Disorder of arteries and arterioles, unspecified: Secondary | ICD-10-CM | POA: Diagnosis not present

## 2022-10-07 DIAGNOSIS — I6522 Occlusion and stenosis of left carotid artery: Secondary | ICD-10-CM | POA: Insufficient documentation

## 2022-10-07 DIAGNOSIS — Z95828 Presence of other vascular implants and grafts: Secondary | ICD-10-CM | POA: Diagnosis not present

## 2022-10-07 LAB — VAS US ABI WITH/WO TBI

## 2022-10-26 DIAGNOSIS — C8590 Non-Hodgkin lymphoma, unspecified, unspecified site: Secondary | ICD-10-CM | POA: Diagnosis not present

## 2022-10-26 DIAGNOSIS — I509 Heart failure, unspecified: Secondary | ICD-10-CM | POA: Diagnosis not present

## 2022-10-26 DIAGNOSIS — I110 Hypertensive heart disease with heart failure: Secondary | ICD-10-CM | POA: Diagnosis not present

## 2022-11-16 ENCOUNTER — Other Ambulatory Visit: Payer: Self-pay | Admitting: *Deleted

## 2022-11-16 MED ORDER — ATORVASTATIN CALCIUM 20 MG PO TABS: ORAL_TABLET | ORAL | 2 refills | Status: DC

## 2022-11-18 ENCOUNTER — Encounter: Payer: Self-pay | Admitting: Cardiovascular Disease

## 2022-11-18 ENCOUNTER — Ambulatory Visit: Payer: PPO | Attending: Cardiovascular Disease | Admitting: Cardiovascular Disease

## 2022-11-18 VITALS — BP 180/100 | HR 70 | Ht 68.0 in | Wt 156.2 lb

## 2022-11-18 DIAGNOSIS — I7143 Infrarenal abdominal aortic aneurysm, without rupture: Secondary | ICD-10-CM | POA: Diagnosis not present

## 2022-11-18 DIAGNOSIS — I714 Abdominal aortic aneurysm, without rupture, unspecified: Secondary | ICD-10-CM

## 2022-11-18 NOTE — Patient Instructions (Signed)
Medication Instructions:  NO CHANGES  *If you need a refill on your cardiac medications before your next appointment, please call your pharmacy*   Follow-Up: At [facility], you and your health needs are our priority.  As part of our continuing mission to provide you with exceptional heart care, we have created designated Provider Care Teams.  These Care Teams include your primary Cardiologist (physician) and Advanced Practice Providers (APPs -  Physician Assistants and Nurse Practitioners) who all work together to provide you with the care you need, when you need it.  We recommend signing up for the patient portal called "MyChart".  Sign up information is provided on this After Visit Summary.  MyChart is used to connect with patients for Virtual Visits (Telemedicine).  Patients are able to view lab/test results, encounter notes, upcoming appointments, etc.  Non-urgent messages can be sent to your provider as well.   To learn more about what you can do with MyChart, go to [email or web address].    Your next appointment:    6 months with Dr. Allyson Sabal Other Instructions You have been referred to Dr. Clotilde Dieter with [facility]

## 2022-11-18 NOTE — Progress Notes (Signed)
11/18/2022 [name] [name]   12-31-1934  [id number]  Primary Physician Babs Sciara, MD Primary Cardiologist: Runell Gess MD Milagros Loll, [place], [place]  HPI:  [name] [name] is a 88 y.o.  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 I last saw him in the office 06/09/2022.  He is accompanied by daughter [name] today... He was admitted on 07/02/13 for Anterior STEMI. Presenting symptoms included severe epigastric burning pain/ indigestion and left arm pain. Initial troponin was > 20.0. He underwent emergent LHC and successful aspiration thrombectomy, PCI and stenting of proximal LAD by Dr. Allyson Sabal, via the right femoral artery . The overall LVEF was estimated at 50%, without wall motion abnormalities. He tolerated the procedure well and left the cath lab in stable condition. DAPT with ASA and Brilinta was initiated. He was also placed on a BB, ACE-I and statin. His post PCI course was w/o complication. He was discharged home on 07/05/13.   It should also be noted that at time of emergent LHC, he was found to also have significant peripheral vascular disease with an occluded left iliac and high-grade right common iliac artery stenosis, as well as a small AAA. The patient complained of bilateral LEE. Dr. Allyson Sabal ordered for him to undergo bilateral LEAs. The study was performed in our office on 07/18/13. Findings are as follows: ABI 0.95 on Rt, 0.59 on Lt.the patient underwent angiography, diamondback orbital rotational arthrectomy, PTA and stenting of a highly calcified ostial right common iliac artery stenosis. He had excellent angiographic and clinical result. His Dopplers normalized to no longer has right lower extremity claudication. He wishes to proceed with attempt at percutaneous opacification of his left common iliac chronic total occlusion. He is not tolerating his Brilenta because of shortness of breath and we  subsequently transitioned to Plavix. He had a left brain stroke thought to be related to a watershed infarct from a hemodynamically significant right carotid stenosis in the setting of an occluded left carotid. He was evaluated by Dr. Roda Shutters from [facility]. Dr. Roda Shutters thought he would be a candidate for carotid stenting should he be found have a right internal carotid artery stenosis of greater than 70%.I intubated him on 05/15/14 revealing at most a 40-50% smooth right internal artery stenosis. I suspect his carotid ultrasound overestimated the degree of stenosis because the contralateral occlusion.  Since I saw him a year ago he's remained stable. He denies chest pain, shortness of breath or claudication. He does want to have a left total knee replacement. A pharmacologic Myoview stress test was intermediate risk with scar and peri-infarct ischemia. His EF is in the 45-55% range. Because of progressively worsening Doppler studies a CT scan scan was performed that showed progression of his right internal carotid artery stenosis compared to his prior CTA. He did have some right common carotid origin stenosis as well. He was neurologically asymptomatic. I did angiogram him approximately 18 months ago revealing at most 50-60% right ICA stenosis. I suspected that the Dopplers overestimated the degree of stenosis because of contralateral occlusion. Because of progression of disease he underwent angiography by myself 10/29/15 with the intent to perform right internal carotid artery stenting. Unfortunately, because of the degree of calcification and tortuosity and was never able to safely access the right common carotid artery and therefore aborted the procedure. The patient also underwent uncomplicated right carotid endarterectomy by Dr. Myra Gianotti  11/27/15 which he has nicely recovered from. Recent carotid Dopplers performed 01/15/16 revealed a widely patent right carotid endarterectomy site.   He was admitted  for 2 days in November with acute on chronic diastolic heart failure and was diuresed.  He was hypertensive and had chest pain at that time but MI was ruled out.    Since I saw him in the office 6 months ago he continues to do well.  His EF did improve up to 40 to 45% on carvedilol and Entresto.  He is less ambulatory and appears more frail.  He complains of increasing shortness of breath and "total body pain.  He was going to physical therapy but has not in a while.  He really does not get out of the house much.  He had recent Doppler studies performed 10/07/2022 that showed his abdominal aortic aneurysm had increased from 4.5 to 5.1 cm.  Current Meds  Medication Sig   acetaminophen (TYLENOL) 500 MG tablet Take 500 mg by mouth every 6 (six) hours as needed for mild pain or moderate pain.    atorvastatin (LIPITOR) 20 MG tablet TAKE 1 TABLET(20 MG) BY MOUTH DAILY   carvedilol (COREG) 6.25 MG tablet TAKE 1 TABLET BY MOUTH 2 TIMES DAILY WITH A MEAL.   clopidogrel (PLAVIX) 75 MG tablet Take 1 tablet (75 mg total) by mouth daily.   furosemide (LASIX) 20 MG tablet TAKE 1 TABLET BY MOUTH EVERY DAY   methylcellulose (ARTIFICIAL TEARS) 1 % ophthalmic solution Place 1 drop into both eyes daily.   pantoprazole (PROTONIX) 20 MG tablet TAKE 1 TABLET BY MOUTH TWICE A DAY   sacubitril-valsartan (ENTRESTO) 49-51 MG Take 1 tablet by mouth 2 (two) times daily.   tamsulosin (FLOMAX) 0.4 MG CAPS capsule TAKE 1 CAPSULE EACH EVENING   traMADol (ULTRAM) 50 MG tablet TAKE 1 TABLET BY MOUTH EVERY 8 HOURS AS NEEDED     Allergies  Allergen Reactions   Brilinta [Ticagrelor] Shortness Of Breath and Other (See Comments)    VERTIGO, also   Influenza Vaccines Other (See Comments)    Pt states he has been hospitalized both times he was given flu vaccine as a younger adult while in the [employer] and they told him not to take it again   Fluoride Preparations Other (See Comments)    Reaction not recalled    Social History    Socioeconomic History   Marital status: Married    Spouse name: Not on file   Number of children: 3   Years of education: ASSOCIATES   Highest education level: Not on file  Occupational History   Occupation: Retired  Tobacco Use   Smoking status: Former    Current packs/day: 0.50    Average packs/day: 0.5 packs/day for 7.0 years (3.5 ttl pk-yrs)    Types: Cigarettes   Smokeless tobacco: Never  Substance and Sexual Activity   Alcohol use: No    Alcohol/week: 0.0 standard drinks of alcohol   Drug use: No   Sexual activity: Not Currently  Other Topics Concern   Not on file  Social History Narrative   Patient is married with 3 children.   Patient is right handed.   Patient has his Associates degree.   Patient drinks 2-3 cups daily.   Social Determinants of Health   Financial Resource Strain: Low Risk  (01/07/2022)   Overall Financial Resource Strain (CARDIA)    Difficulty of Paying Living Expenses: Not hard at all  Food Insecurity: No Food Insecurity (01/07/2022)  Hunger Vital Sign    Worried About Running Out of Food in the Last Year: Never true    Ran Out of Food in the Last Year: Never true  Transportation Needs: No Transportation Needs (01/07/2022)   PRAPARE - Administrator, Civil Service (Medical): No    Lack of Transportation (Non-Medical): No  Physical Activity: Insufficiently Active (01/07/2022)   Exercise Vital Sign    Days of Exercise per Week: 2 days    Minutes of Exercise per Session: 20 min  Stress: No Stress Concern Present (01/07/2022)   [employer] of Occupational Health - Occupational Stress Questionnaire    Feeling of Stress : Only a little  Social Connections: Moderately Isolated (01/07/2022)   Social Connection and Isolation Panel [NHANES]    Frequency of Communication with Friends and Family: Twice a week    Frequency of Social Gatherings with Friends and Family: Twice a week    Attends Religious Services: Never    Automotive engineer or Organizations: No    Attends Banker Meetings: Never    Marital Status: Married  Catering manager Violence: Not At Risk (01/07/2022)   Humiliation, Afraid, Rape, and Kick questionnaire    Fear of Current or Ex-Partner: No    Emotionally Abused: No    Physically Abused: No    Sexually Abused: No     Review of Systems: General: negative for chills, fever, night sweats or weight changes.  Cardiovascular: negative for chest pain, dyspnea on exertion, edema, orthopnea, palpitations, paroxysmal nocturnal dyspnea or shortness of breath Dermatological: negative for rash Respiratory: negative for cough or wheezing Urologic: negative for hematuria Abdominal: negative for nausea, vomiting, diarrhea, bright red blood per rectum, melena, or hematemesis Neurologic: negative for visual changes, syncope, or dizziness All other systems reviewed and are otherwise negative except as noted above.    Blood pressure (!) 180/100, pulse 70, height 5\' 8"  (1.727 m), weight 156 lb 3.2 oz (70.9 kg), SpO2 94%.  General appearance: no distress Neck: no adenopathy, no carotid bruit, no JVD, supple, symmetrical, trachea midline, and thyroid not enlarged, symmetric, no tenderness/mass/nodules Lungs: clear to auscultation bilaterally Heart: regular rate and rhythm, S1, S2 normal, no murmur, click, rub or gallop Extremities: extremities normal, atraumatic, no cyanosis or edema Pulses: Diminished pedal pulses Skin: Skin color, texture, turgor normal. No rashes or lesions Neurologic: Grossly normal  EKG not performed today      ASSESSMENT AND PLAN:   AAA (abdominal aortic aneurysm) [facility]) Mr. Claussen returns today for follow-up of his infrarenal abdominal aortic aneurysm which currently measures 5.1 cm up from 4.5 a year ago.  He is asymptomatic.  I believe I have intervened on both iliac arteries.  His Dopplers show that these are patent.  He seems to be quite frail and is  minimally ambulatory.  I am going refer him to Dr. Chestine Spore for vascular surgical evaluation.     Runell Gess MD FACP,FACC,FAHA, [facility] 11/18/2022 11:57 AM

## 2022-11-18 NOTE — Assessment & Plan Note (Signed)
[name] [name] returns today for follow-up of his infrarenal abdominal aortic aneurysm which currently measures 5.1 cm up from 4.5 a year ago.  He is asymptomatic.  I believe I have intervened on both iliac arteries.  His Dopplers show that these are patent.  He seems to be quite frail and is minimally ambulatory.  I am going refer him to Dr. Chestine Spore for vascular surgical evaluation.

## 2022-11-24 DIAGNOSIS — E785 Hyperlipidemia, unspecified: Secondary | ICD-10-CM | POA: Diagnosis not present

## 2022-11-24 DIAGNOSIS — Z6823 Body mass index (BMI) 23.0-23.9, adult: Secondary | ICD-10-CM | POA: Diagnosis not present

## 2022-12-09 ENCOUNTER — Ambulatory Visit: Payer: PPO | Admitting: Student

## 2022-12-10 ENCOUNTER — Ambulatory Visit (INDEPENDENT_AMBULATORY_CARE_PROVIDER_SITE_OTHER): Payer: PPO

## 2022-12-10 DIAGNOSIS — E782 Mixed hyperlipidemia: Secondary | ICD-10-CM | POA: Diagnosis not present

## 2022-12-10 DIAGNOSIS — I10 Essential (primary) hypertension: Secondary | ICD-10-CM | POA: Diagnosis not present

## 2022-12-10 DIAGNOSIS — I2510 Atherosclerotic heart disease of native coronary artery without angina pectoris: Secondary | ICD-10-CM | POA: Diagnosis not present

## 2022-12-10 LAB — ECHOCARDIOGRAM COMPLETE: Area-P 1/2: 4.6 cm2

## 2022-12-11 ENCOUNTER — Telehealth: Payer: Self-pay | Admitting: Family Medicine

## 2022-12-11 NOTE — Telephone Encounter (Signed)
Patient  needing labs for follow up on 10/18

## 2022-12-13 NOTE — Telephone Encounter (Signed)
Based on his previous labs and also he will be doing future labs with the cancer center-therefore no additional labs from [place] is needed

## 2022-12-15 ENCOUNTER — Ambulatory Visit: Payer: PPO | Admitting: Adult Health

## 2022-12-16 NOTE — Telephone Encounter (Signed)
Patient made aware per drs notes.

## 2022-12-17 ENCOUNTER — Telehealth: Payer: Self-pay | Admitting: Cardiovascular Disease

## 2022-12-17 NOTE — Telephone Encounter (Signed)
Patient is returning call and is requesting return call in regards to echo results.

## 2022-12-17 NOTE — Telephone Encounter (Signed)
Tried to reach patient phone busy x3 will try again

## 2022-12-17 NOTE — Telephone Encounter (Signed)
Spoke with patient, he is aware he needs OV to discuss echo results.   Can he see APP or does he need to see Dr. Allyson Sabal?

## 2022-12-18 NOTE — Telephone Encounter (Signed)
Left message for patient to call back  

## 2022-12-18 NOTE — Telephone Encounter (Signed)
Patient's wife is returning call.

## 2022-12-18 NOTE — Telephone Encounter (Signed)
Left message for pt or wife to call back to schedule office visit.
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
## 2022-12-22 ENCOUNTER — Ambulatory Visit: Payer: PPO | Admitting: Vascular Surgery

## 2022-12-22 ENCOUNTER — Encounter: Payer: Self-pay | Admitting: Vascular Surgery

## 2022-12-22 VITALS — BP 175/80 | HR 59 | Temp 98.1°F

## 2022-12-22 DIAGNOSIS — I7143 Infrarenal abdominal aortic aneurysm, without rupture: Secondary | ICD-10-CM | POA: Diagnosis not present

## 2022-12-22 NOTE — Telephone Encounter (Signed)
Spoke with pt's wife, Britta Mccreedy (ok per [facility]) regarding office visit to discuss recent echo. Pt is not home at the moment and their daughter has been bringing them to doctor appointments so she will need to discuss dates and times with her. Pt will have her daughter call back with time that works for her to bring pt in.

## 2022-12-25 ENCOUNTER — Ambulatory Visit: Payer: PPO | Admitting: Oncology

## 2022-12-25 ENCOUNTER — Inpatient Hospital Stay: Payer: PPO

## 2022-12-29 ENCOUNTER — Ambulatory Visit: Payer: PPO | Admitting: Family Medicine

## 2022-12-30 ENCOUNTER — Other Ambulatory Visit: Payer: Self-pay | Admitting: Family Medicine

## 2022-12-30 ENCOUNTER — Telehealth: Payer: Self-pay

## 2022-12-30 NOTE — Telephone Encounter (Signed)
[employer] pt assistance received via mail. Application completed and faxed.

## 2023-01-04 ENCOUNTER — Other Ambulatory Visit: Payer: Self-pay

## 2023-01-04 DIAGNOSIS — I7143 Infrarenal abdominal aortic aneurysm, without rupture: Secondary | ICD-10-CM

## 2023-01-05 ENCOUNTER — Inpatient Hospital Stay: Payer: PPO | Admitting: Oncology

## 2023-01-05 ENCOUNTER — Inpatient Hospital Stay: Payer: PPO | Attending: Oncology

## 2023-01-05 VITALS — BP 160/82 | HR 64 | Temp 97.5°F | Resp 24 | Ht 68.0 in | Wt 153.4 lb

## 2023-01-05 DIAGNOSIS — D509 Iron deficiency anemia, unspecified: Secondary | ICD-10-CM | POA: Diagnosis not present

## 2023-01-05 DIAGNOSIS — C8510 Unspecified B-cell lymphoma, unspecified site: Secondary | ICD-10-CM

## 2023-01-05 DIAGNOSIS — I714 Abdominal aortic aneurysm, without rupture, unspecified: Secondary | ICD-10-CM | POA: Diagnosis not present

## 2023-01-05 DIAGNOSIS — I739 Peripheral vascular disease, unspecified: Secondary | ICD-10-CM | POA: Insufficient documentation

## 2023-01-05 DIAGNOSIS — I509 Heart failure, unspecified: Secondary | ICD-10-CM | POA: Insufficient documentation

## 2023-01-05 DIAGNOSIS — I110 Hypertensive heart disease with heart failure: Secondary | ICD-10-CM | POA: Diagnosis not present

## 2023-01-05 DIAGNOSIS — D696 Thrombocytopenia, unspecified: Secondary | ICD-10-CM | POA: Diagnosis not present

## 2023-01-05 DIAGNOSIS — I2510 Atherosclerotic heart disease of native coronary artery without angina pectoris: Secondary | ICD-10-CM | POA: Insufficient documentation

## 2023-01-05 DIAGNOSIS — G629 Polyneuropathy, unspecified: Secondary | ICD-10-CM | POA: Diagnosis not present

## 2023-01-05 LAB — CMP (CANCER CENTER ONLY)
ALT: 5 U/L (ref 0–44)
AST: 10 U/L — ABNORMAL LOW (ref 15–41)
Albumin: 4 g/dL (ref 3.5–5.0)
Alkaline Phosphatase: 62 U/L (ref 38–126)
Anion gap: 8 (ref 5–15)
BUN: 31 mg/dL — ABNORMAL HIGH (ref 8–23)
CO2: 25 mmol/L (ref 22–32)
Calcium: 8.6 mg/dL — ABNORMAL LOW (ref 8.9–10.3)
Chloride: 108 mmol/L (ref 98–111)
Creatinine: 1.35 mg/dL — ABNORMAL HIGH (ref 0.61–1.24)
GFR, Estimated: 51 mL/min — ABNORMAL LOW (ref >60)
Glucose, Bld: 117 mg/dL — ABNORMAL HIGH (ref 70–99)
Potassium: 4.2 mmol/L (ref 3.5–5.1)
Sodium: 141 mmol/L (ref 135–145)
Total Bilirubin: 0.5 mg/dL (ref 0.3–1.2)
Total Protein: 6.3 g/dL — ABNORMAL LOW (ref 6.5–8.1)

## 2023-01-05 LAB — CBC WITH DIFFERENTIAL (CANCER CENTER ONLY)
Abs Immature Granulocytes: 0.02 10*3/uL (ref 0.00–0.07)
Basophils Absolute: 0 10*3/uL (ref 0.0–0.1)
Basophils Relative: 1 %
Eosinophils Absolute: 0.2 10*3/uL (ref 0.0–0.5)
Eosinophils Relative: 4 %
HCT: 40.1 % (ref 39.0–52.0)
Hemoglobin: 12.6 g/dL — ABNORMAL LOW (ref 13.0–17.0)
Immature Granulocytes: 0 %
Lymphocytes Relative: 10 %
Lymphs Abs: 0.7 10*3/uL (ref 0.7–4.0)
MCH: 27.8 pg (ref 26.0–34.0)
MCHC: 31.4 g/dL (ref 30.0–36.0)
MCV: 88.3 fL (ref 80.0–100.0)
Monocytes Absolute: 0.5 10*3/uL (ref 0.1–1.0)
Monocytes Relative: 7 %
Neutro Abs: 5.3 10*3/uL (ref 1.7–7.7)
Neutrophils Relative %: 78 %
Platelet Count: 81 10*3/uL — ABNORMAL LOW (ref 150–400)
RBC: 4.54 MIL/uL (ref 4.22–5.81)
RDW: 15.9 % — ABNORMAL HIGH (ref 11.5–15.5)
WBC Count: 6.8 10*3/uL (ref 4.0–10.5)
nRBC: 0 % (ref 0.0–0.2)

## 2023-01-05 LAB — LACTATE DEHYDROGENASE: LDH: 168 U/L (ref 98–192)
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
## 2023-01-05 NOTE — Progress Notes (Signed)
Patient lives with wife and both are not well. Asking for information for home care options. Placed referral for CSW and provided daughter with business card.

## 2023-01-06 NOTE — Telephone Encounter (Signed)
Clinical Social Work received referral from Kem Boroughs, Charity fundraiser, regarding home care.  Attempted to contact patient's daughter, [name], per request.  Her voicemail was full and CSW could not leave a message.  CSW to call at another time to follow up.

## 2023-01-11 ENCOUNTER — Inpatient Hospital Stay: Payer: PPO

## 2023-01-11 NOTE — Progress Notes (Signed)
[facility] Clinical Social Work  Initial Assessment   [name] [name] is a 88 y.o. year old male contacted caregiver by phone. Clinical Social Work spoke with his daughter, [name] [name].  Was referred by nurse, Kem Boroughs. for assessment of psychosocial needs.   SDOH (Social Determinants of Health) assessments performed: Yes SDOH Interventions    Flowsheet Row Clinical Support from 01/07/2022 in [facility] Office Visit from 12/07/2020 in [facility]  SDOH Interventions    Food Insecurity Interventions Intervention Not Indicated Intervention Not Indicated  Housing Interventions Intervention Not Indicated Intervention Not Indicated  Transportation Interventions Intervention Not Indicated Intervention Not Indicated  Utilities Interventions Intervention Not Indicated --  Alcohol Usage Interventions Intervention Not Indicated (Score <7) --  Financial Strain Interventions Intervention Not Indicated Intervention Not Indicated  Physical Activity Interventions Intervention Not Indicated Intervention Not Indicated  Stress Interventions Intervention Not Indicated Intervention Not Indicated  Social Connections Interventions -- Intervention Not Indicated       SDOH Screenings   Food Insecurity: No Food Insecurity (01/07/2022)  Housing: Low Risk  (01/07/2022)  Transportation Needs: No Transportation Needs (01/07/2022)  Utilities: Not At Risk (01/07/2022)  Alcohol Screen: Low Risk  (01/07/2022)  Depression (PHQ2-9): Medium Risk (06/19/2022)  Financial Resource Strain: Low Risk  (01/07/2022)  Physical Activity: Insufficiently Active (01/07/2022)  Social Connections: Moderately Isolated (01/07/2022)  Stress: No Stress Concern Present (01/07/2022)  Tobacco Use: Medium Risk (12/22/2022)     Distress Screen completed: No     No data to display            Family/Social Information:  Housing Arrangement: patient lives with wife, [name] [name]. Family  members/support persons in your life? Patient's daughter and son are very involved with their care. Transportation concerns: no Employment: Retired .  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  Coping/ Adjustment to diagnosis: Patient understands treatment plan and what happens next? 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  Clinical Social Work Clinical Goal(s):  Explore community resource options for unmet needs related to:  None  Interventions: Discussed common feeling and emotions when being diagnosed with cancer, and the importance of support during treatment Informed patient of the support team roles and support services at [facility] Provided CSW contact information and encouraged patient to call with any questions or concerns Provided patient with information about home care options.  She stated she is just beginning the process of securing home care for both of her parents.  Also provided [name] [name] with [facility] contact information.   Follow Up Plan: Patient will contact CSW with any support or resource needs Patient verbalizes understanding of plan: Yes    Dorothey Baseman, LCSW Clinical Social Worker [facility]

## 2023-01-15 ENCOUNTER — Ambulatory Visit: Payer: PPO | Admitting: Family Medicine

## 2023-01-15 VITALS — BP 193/97 | HR 71 | Wt 155.2 lb

## 2023-01-15 DIAGNOSIS — E782 Mixed hyperlipidemia: Secondary | ICD-10-CM | POA: Diagnosis not present

## 2023-01-15 DIAGNOSIS — M791 Myalgia, unspecified site: Secondary | ICD-10-CM

## 2023-01-15 DIAGNOSIS — N183 Chronic kidney disease, stage 3 unspecified: Secondary | ICD-10-CM | POA: Diagnosis not present

## 2023-01-15 DIAGNOSIS — R634 Abnormal weight loss: Secondary | ICD-10-CM

## 2023-01-15 DIAGNOSIS — M2550 Pain in unspecified joint: Secondary | ICD-10-CM

## 2023-01-15 DIAGNOSIS — R296 Repeated falls: Secondary | ICD-10-CM | POA: Diagnosis not present

## 2023-01-17 NOTE — Progress Notes (Signed)
Subjective:    Patient ID: [name] [name], male    DOB: 1934-05-13, 88 y.o.   MRN: [id number]  Discussed the use of AI scribe software for clinical note transcription with the patient, who gave verbal consent to proceed.  History of Present Illness   [name] [name], a patient with a history of heart disease and cancer, presents with increased fatigue and decreased activity levels. He reports feeling "tired" and "like a lady's love," and has been sitting around more due to this fatigue. He denies any recent setbacks, but his caregiver notes a decrease in mobility and an increase in blood pressure with activity. [name] [name] also reports a recent fall when trying to open a door, resulting in a skin tear on the right elbow but no other injuries.  [name] [name]'s appetite remains good, but there has been a noticeable weight loss and muscle weakness. He describes his joints as feeling "like the rust has been trying to pull off them," and reports constant pain that is not significantly relieved by Tramadol.  [name] [name]'s oncologist and cardiologist have been monitoring his cancer and aneurysm, respectively. The patient reports feeling "fine" with the oncologist's care and believes things are going well in that area. However, he reports that the cardiologist is "looking for something," referring to the aneurysm. [name] [name]'s caregiver notes that the patient's ejection fraction has decreased, contributing to shortness of breath and fatigue. Despite this, the patient denies feeling short of breath during activity.  In summary, [name] [name] presents with increased fatigue, decreased mobility, and constant pain, despite ongoing treatment for heart disease and cancer. He has experienced a recent fall but denies any significant injuries. The patient's condition appears to have improved with physical therapy in the past, and this treatment will be reinitiated.      Patient has difficulty with standing, will walk, had frequent falls  Review of  Systems     Objective:    Physical Exam   VITALS: BP- 164/78 CHEST: Lungs clear to auscultation. CARDIOVASCULAR: Heart rhythm regular. Asymmetry in arm blood pressure noted, suggestive of atherosclerosis causing vascular stiffness. SKIN: Skin tear on right elbow.     Muscle weight is decreased patient has findings consistent with muscle wasting he also has difficulty with his arthritis standing and walking mild ataxia      Assessment & Plan:  Assessment and Plan    Chronic Fatigue Persistent fatigue and decreased activity level. Likely multifactorial due to underlying health conditions and deconditioning. -Encourage increased activity as tolerated. -Reinitiate physical therapy at [facility].  Hypertension Elevated blood pressure readings, likely due to atherosclerosis and deconditioning. -Continue current management.  Fall Risk Recent fall with minor skin injuries. Increased risk due to deconditioning and potential orthostatic hypotension. -Encourage safe mobility practices and physical therapy.  Chronic Pain Persistent pain, partially managed with Tramadol. -Continue Tramadol as needed for pain.  Aortic Aneurysm Stable, currently at 5.1cm. Discussed surgical intervention threshold with patient and caregiver. -Monitor closely, no change in management at this time.  Cancer Under the care of Dr. Elnita Maxwell, patient and caregiver report satisfaction with current management. -Continue current oncology follow-up and treatment.  Weight Loss Ongoing weight loss likely due to decreased muscle mass and potential decreased caloric intake. -Encourage increased caloric intake, including use of nutritional supplements like Ensure.  Follow-up in 6 months with oncology team and in April with primary care. No additional blood work needed at this time given recent labs by Dr. Elnita Maxwell.     Patient with significant combination of  fatigue, muscle wasting, ataxia with increased risk  of falls.  Patient would benefit physical.  We will go ahead with referral

## 2023-01-18 ENCOUNTER — Other Ambulatory Visit: Payer: Self-pay

## 2023-01-18 DIAGNOSIS — R270 Ataxia, unspecified: Secondary | ICD-10-CM

## 2023-01-18 DIAGNOSIS — R296 Repeated falls: Secondary | ICD-10-CM

## 2023-01-25 ENCOUNTER — Encounter: Payer: Self-pay | Admitting: Cardiovascular Disease
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
## 2023-02-02 ENCOUNTER — Ambulatory Visit: Payer: PPO | Attending: Family Medicine | Admitting: Physical Therapy

## 2023-02-02 ENCOUNTER — Other Ambulatory Visit: Payer: Self-pay

## 2023-02-02 ENCOUNTER — Encounter: Payer: Self-pay | Admitting: Physical Therapy

## 2023-02-02 DIAGNOSIS — M6281 Muscle weakness (generalized): Secondary | ICD-10-CM | POA: Insufficient documentation

## 2023-02-02 DIAGNOSIS — R296 Repeated falls: Secondary | ICD-10-CM | POA: Insufficient documentation

## 2023-02-02 DIAGNOSIS — R262 Difficulty in walking, not elsewhere classified: Secondary | ICD-10-CM

## 2023-02-02 DIAGNOSIS — R2681 Unsteadiness on feet: Secondary | ICD-10-CM | POA: Diagnosis present

## 2023-02-02 DIAGNOSIS — R270 Ataxia, unspecified: Secondary | ICD-10-CM | POA: Insufficient documentation

## 2023-02-06 ENCOUNTER — Other Ambulatory Visit: Payer: Self-pay | Admitting: Family Medicine

## 2023-02-09 ENCOUNTER — Other Ambulatory Visit: Payer: Self-pay

## 2023-02-09 MED ORDER — ENTRESTO 49-51 MG PO TABS: 1.0000 | ORAL_TABLET | Freq: Two times a day (BID) | ORAL | 2 refills | Status: DC

## 2023-02-15 ENCOUNTER — Other Ambulatory Visit: Payer: Self-pay | Admitting: Cardiovascular Disease

## 2023-02-17 ENCOUNTER — Ambulatory Visit: Payer: PPO

## 2023-02-18 MED ORDER — SACUBITRIL-VALSARTAN 49-51 MG PO TABS: 1.0000 | ORAL_TABLET | Freq: Two times a day (BID) | ORAL | 3 refills | Status: DC

## 2023-02-19 ENCOUNTER — Ambulatory Visit: Payer: PPO

## 2023-02-19 DIAGNOSIS — M25562 Pain in left knee: Secondary | ICD-10-CM

## 2023-02-19 DIAGNOSIS — R270 Ataxia, unspecified: Secondary | ICD-10-CM | POA: Diagnosis not present

## 2023-02-19 DIAGNOSIS — M6281 Muscle weakness (generalized): Secondary | ICD-10-CM

## 2023-02-19 DIAGNOSIS — R262 Difficulty in walking, not elsewhere classified: Secondary | ICD-10-CM

## 2023-02-19 DIAGNOSIS — R252 Cramp and spasm: Secondary | ICD-10-CM

## 2023-02-19 DIAGNOSIS — R2681 Unsteadiness on feet: Secondary | ICD-10-CM

## 2023-02-19 DIAGNOSIS — R293 Abnormal posture: Secondary | ICD-10-CM
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
## 2023-03-01 ENCOUNTER — Telehealth: Payer: Self-pay | Admitting: Physical Therapy

## 2023-03-01 ENCOUNTER — Ambulatory Visit: Payer: PPO | Admitting: Physical Therapy

## 2023-03-01 NOTE — Telephone Encounter (Signed)
Phoned patient's daughter regarding his missed appointment today. She stated that she cancelled through MyChart this morning due to patient not feeling up to therapy today. This cancellation message may have been sent to another provider as it wasn't received here. Reminded pt's dtr regarding next appointment time.

## 2023-03-03 ENCOUNTER — Ambulatory Visit: Payer: PPO | Attending: Family Medicine

## 2023-03-03 DIAGNOSIS — R252 Cramp and spasm: Secondary | ICD-10-CM | POA: Diagnosis not present

## 2023-03-03 DIAGNOSIS — R262 Difficulty in walking, not elsewhere classified: Secondary | ICD-10-CM | POA: Insufficient documentation

## 2023-03-03 DIAGNOSIS — M6281 Muscle weakness (generalized): Secondary | ICD-10-CM | POA: Insufficient documentation

## 2023-03-03 DIAGNOSIS — R2681 Unsteadiness on feet: Secondary | ICD-10-CM | POA: Diagnosis not present

## 2023-03-03 DIAGNOSIS — M25562 Pain in left knee: Secondary | ICD-10-CM | POA: Insufficient documentation

## 2023-03-03 DIAGNOSIS — R293 Abnormal posture: Secondary | ICD-10-CM | POA: Diagnosis not present
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
## 2023-03-08 ENCOUNTER — Encounter: Payer: Self-pay | Admitting: Physical Therapy

## 2023-03-08 ENCOUNTER — Ambulatory Visit: Payer: PPO | Admitting: Physical Therapy

## 2023-03-08 DIAGNOSIS — R2681 Unsteadiness on feet: Secondary | ICD-10-CM | POA: Diagnosis not present

## 2023-03-08 DIAGNOSIS — M6281 Muscle weakness (generalized): Secondary | ICD-10-CM

## 2023-03-08 DIAGNOSIS — R262 Difficulty in walking, not elsewhere classified: Secondary | ICD-10-CM

## 2023-03-10 ENCOUNTER — Ambulatory Visit: Payer: PPO

## 2023-03-10 DIAGNOSIS — R262 Difficulty in walking, not elsewhere classified: Secondary | ICD-10-CM

## 2023-03-10 DIAGNOSIS — R293 Abnormal posture: Secondary | ICD-10-CM

## 2023-03-10 DIAGNOSIS — M6281 Muscle weakness (generalized): Secondary | ICD-10-CM

## 2023-03-10 DIAGNOSIS — R252 Cramp and spasm: Secondary | ICD-10-CM

## 2023-03-10 DIAGNOSIS — R2681 Unsteadiness on feet: Secondary | ICD-10-CM

## 2023-03-10 DIAGNOSIS — M25562 Pain in left knee: Secondary | ICD-10-CM
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
## 2023-03-16 ENCOUNTER — Ambulatory Visit: Payer: PPO

## 2023-03-16 DIAGNOSIS — R252 Cramp and spasm: Secondary | ICD-10-CM

## 2023-03-16 DIAGNOSIS — R2681 Unsteadiness on feet: Secondary | ICD-10-CM | POA: Diagnosis not present

## 2023-03-16 DIAGNOSIS — R262 Difficulty in walking, not elsewhere classified: Secondary | ICD-10-CM

## 2023-03-16 DIAGNOSIS — R293 Abnormal posture: Secondary | ICD-10-CM

## 2023-03-16 DIAGNOSIS — M6281 Muscle weakness (generalized): Secondary | ICD-10-CM

## 2023-03-16 DIAGNOSIS — M25562 Pain in left knee: Secondary | ICD-10-CM
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
## 2023-03-19 ENCOUNTER — Ambulatory Visit: Payer: PPO

## 2023-03-19 DIAGNOSIS — M25562 Pain in left knee: Secondary | ICD-10-CM

## 2023-03-19 DIAGNOSIS — R262 Difficulty in walking, not elsewhere classified: Secondary | ICD-10-CM

## 2023-03-19 DIAGNOSIS — R2681 Unsteadiness on feet: Secondary | ICD-10-CM | POA: Diagnosis not present

## 2023-03-19 DIAGNOSIS — R293 Abnormal posture: Secondary | ICD-10-CM

## 2023-03-19 DIAGNOSIS — M6281 Muscle weakness (generalized): Secondary | ICD-10-CM

## 2023-03-19 DIAGNOSIS — R252 Cramp and spasm: Secondary | ICD-10-CM
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
## 2023-03-22 ENCOUNTER — Ambulatory Visit: Payer: PPO

## 2023-03-22 DIAGNOSIS — R252 Cramp and spasm: Secondary | ICD-10-CM

## 2023-03-22 DIAGNOSIS — R2681 Unsteadiness on feet: Secondary | ICD-10-CM | POA: Diagnosis not present

## 2023-03-22 DIAGNOSIS — M6281 Muscle weakness (generalized): Secondary | ICD-10-CM

## 2023-03-22 DIAGNOSIS — M25562 Pain in left knee: Secondary | ICD-10-CM

## 2023-03-22 DIAGNOSIS — R262 Difficulty in walking, not elsewhere classified: Secondary | ICD-10-CM

## 2023-03-22 DIAGNOSIS — R293 Abnormal posture: Secondary | ICD-10-CM
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
## 2023-03-30 ENCOUNTER — Ambulatory Visit: Payer: PPO

## 2023-03-30 ENCOUNTER — Telehealth: Payer: Self-pay

## 2023-03-30 NOTE — Telephone Encounter (Signed)
 Due to both [name] and his wife having appointments at the same time, and dtr, [name] is caregiver, primary PT for [name]'s wife, [name] [name], placed call to inquire if both patients were aware they had a missed appointment this morning.  No answer.  Left message with [name] that both PT's had openings at 12:30 pm if they would like to reschedule.

## 2023-04-01 ENCOUNTER — Ambulatory Visit: Payer: PPO | Attending: Family Medicine

## 2023-04-01 DIAGNOSIS — M6281 Muscle weakness (generalized): Secondary | ICD-10-CM | POA: Insufficient documentation

## 2023-04-01 DIAGNOSIS — R293 Abnormal posture: Secondary | ICD-10-CM | POA: Insufficient documentation

## 2023-04-01 DIAGNOSIS — M25562 Pain in left knee: Secondary | ICD-10-CM | POA: Diagnosis not present

## 2023-04-01 DIAGNOSIS — R262 Difficulty in walking, not elsewhere classified: Secondary | ICD-10-CM | POA: Insufficient documentation

## 2023-04-01 DIAGNOSIS — R252 Cramp and spasm: Secondary | ICD-10-CM | POA: Diagnosis not present

## 2023-04-01 DIAGNOSIS — R2681 Unsteadiness on feet: Secondary | ICD-10-CM | POA: Insufficient documentation
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
## 2023-04-14 ENCOUNTER — Ambulatory Visit: Payer: PPO

## 2023-04-22 ENCOUNTER — Telehealth: Payer: Self-pay | Admitting: *Deleted

## 2023-04-22 DIAGNOSIS — E782 Mixed hyperlipidemia: Secondary | ICD-10-CM

## 2023-04-22 DIAGNOSIS — D696 Thrombocytopenia, unspecified: Secondary | ICD-10-CM

## 2023-04-22 DIAGNOSIS — N183 Chronic kidney disease, stage 3 unspecified: Secondary | ICD-10-CM

## 2023-04-22 NOTE — Telephone Encounter (Signed)
Copied from CRM [id number]. Topic: Clinical - Request for Lab/Test Order >> Apr 22, 2023  1:28 PM Ivette P wrote: Reason for CRM: Pt is requesting lab work order for upcoming 4 month follow appt with Dr. Gerda Diss on 02/20

## 2023-04-26 NOTE — Telephone Encounter (Signed)
Blood work ordered in [employer].  Left message to return call to notify patient

## 2023-04-26 NOTE — Telephone Encounter (Signed)
I would recommend CMP, CBC, lipid Diagnosis hyperlipidemia, high risk medication, history leukemia, normochromic anemia

## 2023-04-28 ENCOUNTER — Ambulatory Visit: Payer: PPO

## 2023-04-28 DIAGNOSIS — R2681 Unsteadiness on feet: Secondary | ICD-10-CM

## 2023-04-28 DIAGNOSIS — R252 Cramp and spasm: Secondary | ICD-10-CM

## 2023-04-28 DIAGNOSIS — R262 Difficulty in walking, not elsewhere classified: Secondary | ICD-10-CM

## 2023-04-28 DIAGNOSIS — M25562 Pain in left knee: Secondary | ICD-10-CM

## 2023-04-28 DIAGNOSIS — M6281 Muscle weakness (generalized): Secondary | ICD-10-CM

## 2023-04-28 DIAGNOSIS — R293 Abnormal posture: Secondary | ICD-10-CM
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
## 2023-04-28 NOTE — Telephone Encounter (Signed)
Not able to reach pt by phone to relay message, labs have been ordered.

## 2023-05-05 ENCOUNTER — Ambulatory Visit: Payer: PPO | Attending: Family Medicine

## 2023-05-05 DIAGNOSIS — M25562 Pain in left knee: Secondary | ICD-10-CM | POA: Insufficient documentation

## 2023-05-05 DIAGNOSIS — R262 Difficulty in walking, not elsewhere classified: Secondary | ICD-10-CM | POA: Diagnosis not present

## 2023-05-05 DIAGNOSIS — M6281 Muscle weakness (generalized): Secondary | ICD-10-CM | POA: Insufficient documentation

## 2023-05-05 DIAGNOSIS — R252 Cramp and spasm: Secondary | ICD-10-CM | POA: Insufficient documentation

## 2023-05-05 DIAGNOSIS — R2681 Unsteadiness on feet: Secondary | ICD-10-CM | POA: Diagnosis not present

## 2023-05-05 DIAGNOSIS — R293 Abnormal posture: Secondary | ICD-10-CM | POA: Diagnosis not present
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
## 2023-05-10 DIAGNOSIS — N183 Chronic kidney disease, stage 3 unspecified: Secondary | ICD-10-CM | POA: Diagnosis not present

## 2023-05-10 DIAGNOSIS — E782 Mixed hyperlipidemia: Secondary | ICD-10-CM | POA: Diagnosis not present

## 2023-05-10 DIAGNOSIS — D696 Thrombocytopenia, unspecified: Secondary | ICD-10-CM | POA: Diagnosis not present

## 2023-05-11 ENCOUNTER — Encounter: Payer: Self-pay | Admitting: Family Medicine

## 2023-05-11 LAB — CBC WITH DIFFERENTIAL/PLATELET
Basophils Absolute: 0 10*3/uL (ref 0.0–0.2)
Basos: 0 %
EOS (ABSOLUTE): 0.3 10*3/uL (ref 0.0–0.4)
Eos: 4 %
Hematocrit: 43.1 % (ref 37.5–51.0)
Hemoglobin: 14.2 g/dL (ref 13.0–17.7)
Immature Grans (Abs): 0 10*3/uL (ref 0.0–0.1)
Immature Granulocytes: 0 %
Lymphocytes Absolute: 0.9 10*3/uL (ref 0.7–3.1)
Lymphs: 13 %
MCH: 28.3 pg (ref 26.6–33.0)
MCHC: 32.9 g/dL (ref 31.5–35.7)
MCV: 86 fL (ref 79–97)
Monocytes Absolute: 0.6 10*3/uL (ref 0.1–0.9)
Monocytes: 8 %
Neutrophils Absolute: 5.3 10*3/uL (ref 1.4–7.0)
Neutrophils: 75 %
Platelets: 97 10*3/uL — CL (ref 150–450)
RBC: 5.01 x10E6/uL (ref 4.14–5.80)
RDW: 15.3 % (ref 11.6–15.4)
WBC: 7.1 10*3/uL (ref 3.4–10.8)

## 2023-05-11 LAB — CMP14+EGFR
ALT: 8 IU/L (ref 0–44)
AST: 13 IU/L (ref 0–40)
Albumin: 4 g/dL (ref 3.7–4.7)
Alkaline Phosphatase: 100 IU/L (ref 44–121)
BUN/Creatinine Ratio: 26 — ABNORMAL HIGH (ref 10–24)
BUN: 38 mg/dL — ABNORMAL HIGH (ref 8–27)
Bilirubin Total: 0.5 mg/dL (ref 0.0–1.2)
CO2: 20 mmol/L (ref 20–29)
Calcium: 8.7 mg/dL (ref 8.6–10.2)
Chloride: 106 mmol/L (ref 96–106)
Creatinine, Ser: 1.44 mg/dL — ABNORMAL HIGH (ref 0.76–1.27)
Globulin, Total: 2.4 g/dL (ref 1.5–4.5)
Glucose: 100 mg/dL — ABNORMAL HIGH (ref 70–99)
Potassium: 4.1 mmol/L (ref 3.5–5.2)
Sodium: 146 mmol/L — ABNORMAL HIGH (ref 134–144)
Total Protein: 6.4 g/dL (ref 6.0–8.5)
eGFR: 47 mL/min/1.73 — ABNORMAL LOW

## 2023-05-11 LAB — LIPID PANEL
Chol/HDL Ratio: 2.3 {ratio} (ref 0.0–5.0)
Cholesterol, Total: 90 mg/dL — ABNORMAL LOW (ref 100–199)
HDL: 40 mg/dL (ref >39)
LDL Chol Calc (NIH): 35 mg/dL (ref 0–99)
Triglycerides: 72 mg/dL (ref 0–149)
VLDL Cholesterol Cal: 15 mg/dL (ref 5–40)

## 2023-05-18 ENCOUNTER — Other Ambulatory Visit: Payer: Self-pay | Admitting: Cardiovascular Disease

## 2023-05-19 ENCOUNTER — Ambulatory Visit: Payer: PPO | Admitting: Rehabilitative and Restorative Service Providers"

## 2023-05-19 ENCOUNTER — Encounter: Payer: Self-pay | Admitting: Rehabilitative and Restorative Service Providers"

## 2023-05-19 DIAGNOSIS — R293 Abnormal posture: Secondary | ICD-10-CM

## 2023-05-19 DIAGNOSIS — R262 Difficulty in walking, not elsewhere classified: Secondary | ICD-10-CM

## 2023-05-19 DIAGNOSIS — M25562 Pain in left knee: Secondary | ICD-10-CM

## 2023-05-19 DIAGNOSIS — R252 Cramp and spasm: Secondary | ICD-10-CM

## 2023-05-19 DIAGNOSIS — M6281 Muscle weakness (generalized): Secondary | ICD-10-CM

## 2023-05-19 DIAGNOSIS — R2681 Unsteadiness on feet: Secondary | ICD-10-CM
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
## 2023-05-20 ENCOUNTER — Ambulatory Visit: Payer: PPO | Admitting: Family Medicine

## 2023-05-21 ENCOUNTER — Ambulatory Visit: Payer: HMO | Admitting: Family Medicine

## 2023-05-24 ENCOUNTER — Ambulatory Visit (INDEPENDENT_AMBULATORY_CARE_PROVIDER_SITE_OTHER): Payer: HMO | Admitting: Family Medicine

## 2023-05-24 VITALS — BP 122/64 | HR 60 | Temp 98.1°F | Ht 68.0 in | Wt 145.0 lb

## 2023-05-24 DIAGNOSIS — F322 Major depressive disorder, single episode, severe without psychotic features: Secondary | ICD-10-CM | POA: Insufficient documentation

## 2023-05-24 DIAGNOSIS — R634 Abnormal weight loss: Secondary | ICD-10-CM

## 2023-05-24 DIAGNOSIS — I5042 Chronic combined systolic (congestive) and diastolic (congestive) heart failure: Secondary | ICD-10-CM

## 2023-05-24 DIAGNOSIS — C8510 Unspecified B-cell lymphoma, unspecified site: Secondary | ICD-10-CM

## 2023-05-24 DIAGNOSIS — N183 Chronic kidney disease, stage 3 unspecified: Secondary | ICD-10-CM | POA: Diagnosis not present

## 2023-05-24 NOTE — Progress Notes (Unsigned)
   Subjective:    Patient ID: [name] [name], male    DOB: September 27, 1934, 89 y.o.   MRN: [id number]  HPI Patient has underlying history of arthritis Also history of difficulty hearing In addition to this has been treated by oncology for B-cell lymphoma Patient has been having significant weight loss over the past 6 months is gone from 165 down to 161 down to 152 now down to 145 Does not have healthy appetite Dealing with some depression Also has underlying vascular disease and chronic kidney disease He also relates a lot of pain and discomfort in his joints and increased weakness with some falls recently Patient denies night sweats Denies chest pressure tightness Does get out of breath with moving around States his bowels are moving okay Denies hematochezia or hematuria  Review of Systems     Objective:   Physical Exam  General-in no acute distress Eyes-no discharge Lungs-respiratory rate normal, CTA CV-no murmurs,RRR Extremities skin warm dry no edema Neuro grossly normal Behavior normal, alert  Hemoglobin reassuring white blood count reassuring CKD noted To some degree patient dealing with feeling lack of initiative and some level of being depressed related to his aging process and hurting a lot in his joints this could be playing a role into his decline as well    Assessment & Plan:  1. B-cell lymphoma, unspecified B-cell lymphoma type, unspecified body region (HCC) (Primary) I am concerned with his weight loss It is possible this could be related to his chronic diseases and getting older but I cannot exclude the possibility of something negative causing the weight loss as well We will connect with his oncologist Patient may well benefit from a CT scan of chest and abdomen given the weight loss I have encouraged him to try to increase his oral intake we will follow him up in approximately 3 to 4 months  2. Chronic combined systolic and diastolic congestive heart  failure [facility]) Patient has appointment with cardiology coming up I do not find evidence of any acute flareup of his CHF  3. Stage 3 chronic kidney disease, unspecified whether stage 3a or 3b CKD (HCC) Lab recent lab work look good stable kidney function  Weight loss-concerning for this patient given his cardiovascular history as well as cancer history will touch base with oncology I suspect he will need to have some scans

## 2023-05-25 ENCOUNTER — Other Ambulatory Visit: Payer: Self-pay | Admitting: Family Medicine

## 2023-05-26 ENCOUNTER — Ambulatory Visit: Payer: PPO

## 2023-05-26 ENCOUNTER — Telehealth: Payer: Self-pay | Admitting: Family Medicine

## 2023-05-26 DIAGNOSIS — R293 Abnormal posture: Secondary | ICD-10-CM

## 2023-05-26 DIAGNOSIS — R2681 Unsteadiness on feet: Secondary | ICD-10-CM

## 2023-05-26 DIAGNOSIS — R262 Difficulty in walking, not elsewhere classified: Secondary | ICD-10-CM

## 2023-05-26 DIAGNOSIS — R252 Cramp and spasm: Secondary | ICD-10-CM

## 2023-05-26 DIAGNOSIS — M6281 Muscle weakness (generalized): Secondary | ICD-10-CM

## 2023-05-26 DIAGNOSIS — M25562 Pain in left knee: Secondary | ICD-10-CM

## 2023-05-26 NOTE — Telephone Encounter (Signed)
 Hi Dr.Sherrill  [name] [name] is a mutual patient. This patient is having ongoing weight loss.  Partly could be related to chronic diseases.  But given his lymphoma history I was leaning toward CT scan chest abdomen pelvis?  Would like to get your input regarding this.  Family would appreciate it as well. (I had previously sent a secure chat regarding this issue-feel free to look at my office visit note from his most recent visit this week)  Thank you again for your time I appreciate your input [facility]
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
## 2023-05-27 ENCOUNTER — Telehealth: Payer: Self-pay | Admitting: Family Medicine

## 2023-05-27 NOTE — Telephone Encounter (Signed)
 Nurses Please connect with [name] [name] (FYI-he is hard of hearing sometimes his wife does the talking for him) He was recently seen having significant weight loss.  He is lost 20 pounds over the past 1 year He has a history of B-cell lymphoma I did discuss his case with his oncologist  They are open to going ahead and having him to have CT scan chest abdomen pelvis  So therefore please order CT scan chest abdomen pelvis Reason-history of B-cell lymphoma and significant unintentional weight loss  When ordering the test is important to put on there that he has CKD 3B with GFR of 47 recommend low-dose contrast  I did speak with radiologist regarding this protocol  It is also important to instruct the patient to make sure on the day he goes for his test that he is well-hydrated in regards to water and make sure after the test and the following day to drink a little more water than normal in other words stay well-hydrated around that time  Typically when they do the scan the specialist will read the results within 2 weeks and we will let them know the results if they do not hear anything within 2 weeks of the test to let us know thank you

## 2023-05-31 ENCOUNTER — Other Ambulatory Visit: Payer: Self-pay

## 2023-05-31 DIAGNOSIS — Z8572 Personal history of non-Hodgkin lymphomas: Secondary | ICD-10-CM

## 2023-05-31 DIAGNOSIS — R634 Abnormal weight loss: Secondary | ICD-10-CM

## 2023-05-31 NOTE — Telephone Encounter (Signed)
 Pt called left message to return call

## 2023-06-01 ENCOUNTER — Other Ambulatory Visit: Payer: Self-pay | Admitting: Vascular Surgery

## 2023-06-01 ENCOUNTER — Ambulatory Visit: Payer: PPO | Admitting: Physician Assistant

## 2023-06-01 ENCOUNTER — Ambulatory Visit (HOSPITAL_COMMUNITY)
Admission: RE | Admit: 2023-06-01 | Discharge: 2023-06-01 | Disposition: A | Discharge status: Home / Self Care | Payer: HMO | Source: Ambulatory Visit | Attending: Vascular Surgery | Admitting: Vascular Surgery

## 2023-06-01 VITALS — BP 146/93 | HR 77 | Temp 98.0°F | Resp 18 | Ht 68.0 in | Wt 147.8 lb

## 2023-06-01 DIAGNOSIS — I7143 Infrarenal abdominal aortic aneurysm, without rupture: Secondary | ICD-10-CM

## 2023-06-01 NOTE — Telephone Encounter (Signed)
 Pt called. Spoke to wife and relayed message. She mentioned that he will have follow up imaging completed with other imaging test that another provider ordered.
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
## 2023-06-09 ENCOUNTER — Ambulatory Visit (HOSPITAL_COMMUNITY)
Admission: RE | Admit: 2023-06-09 | Discharge: 2023-06-09 | Disposition: A | Discharge status: Home / Self Care | Source: Ambulatory Visit | Attending: Family Medicine | Admitting: Family Medicine

## 2023-06-09 DIAGNOSIS — R634 Abnormal weight loss: Secondary | ICD-10-CM | POA: Insufficient documentation

## 2023-06-09 DIAGNOSIS — K828 Other specified diseases of gallbladder: Secondary | ICD-10-CM | POA: Diagnosis not present

## 2023-06-09 DIAGNOSIS — I714 Abdominal aortic aneurysm, without rupture, unspecified: Secondary | ICD-10-CM | POA: Diagnosis not present

## 2023-06-09 DIAGNOSIS — K5730 Diverticulosis of large intestine without perforation or abscess without bleeding: Secondary | ICD-10-CM | POA: Diagnosis not present

## 2023-06-09 DIAGNOSIS — Z8572 Personal history of non-Hodgkin lymphomas: Secondary | ICD-10-CM | POA: Insufficient documentation

## 2023-06-09 DIAGNOSIS — R918 Other nonspecific abnormal finding of lung field: Secondary | ICD-10-CM | POA: Diagnosis not present

## 2023-06-09 MED ORDER — IOHEXOL 300 MG/ML  SOLN
100.0000 mL | Freq: Once | INTRAMUSCULAR | Status: AC | PRN
  Administered 2023-06-09: 80 mL via INTRAVENOUS

## 2023-06-09 MED ORDER — IOHEXOL 9 MG/ML PO SOLN
500.0000 mL | ORAL | Status: AC
  Administered 2023-06-09: 500 mL via ORAL

## 2023-06-10 ENCOUNTER — Encounter: Payer: Self-pay | Admitting: Family Medicine

## 2023-06-14 ENCOUNTER — Ambulatory Visit: Payer: Self-pay

## 2023-06-15 ENCOUNTER — Other Ambulatory Visit: Payer: Self-pay

## 2023-06-15 DIAGNOSIS — I7143 Infrarenal abdominal aortic aneurysm, without rupture: Secondary | ICD-10-CM

## 2023-06-17 ENCOUNTER — Telehealth: Payer: Self-pay | Admitting: Family Medicine

## 2023-06-17 NOTE — Telephone Encounter (Signed)
 Hi Emma  Recently I did a CT scan on this patient.  His aortic aneurysm is now 6 cm.  Family would be interested with treatment if it can be done endovascular.  You recently saw him.  I appreciate your note.  This CT finding is a updated finding therefore needs further input from vascular surgery.  I did put in a referral.  I am requesting that you work with your referral team to get him an appointment with the vascular doctor regarding correction of this issue.  Please see your assessment and plan below as a reminder thank you-[facility]   ASSESSMENT/PLAN: AAA asymptomatic  The duplex today reports 5.5 cm AAA.  He has had weight loss and has B cell Lymphoma.  His PCP has ordered a CTA Chest/Abdomin/Pelvis.  Once this is done I will have him f/u with DR. Clark to discuss his options.  I spoke with his daughter that is with him and asked her if they want to have it fixed.  She states that if we can do it endo vascularly possibly, but she doesn't think open surgery would be a good idea.               If he develops increased abdominal pain or lumbar pain out of the ordinary he should go to [facility] ED.
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
## 2023-06-22 ENCOUNTER — Encounter: Payer: Self-pay | Admitting: Vascular Surgery

## 2023-06-22 ENCOUNTER — Ambulatory Visit: Admitting: Vascular Surgery

## 2023-06-22 VITALS — HR 66 | Temp 98.1°F | Resp 18 | Ht 68.0 in | Wt 141.5 lb

## 2023-06-22 DIAGNOSIS — I7143 Infrarenal abdominal aortic aneurysm, without rupture: Secondary | ICD-10-CM

## 2023-06-24 NOTE — Telephone Encounter (Signed)
 Patient was evaluated by vascular surgery unfortunately there is no easy fix for this and more than likely they will just monitor every 6 months.  He is not a good candidate for open surgery.  Patient is aware.

## 2023-06-25 ENCOUNTER — Ambulatory Visit: Payer: HMO | Attending: Family Medicine

## 2023-06-25 ENCOUNTER — Other Ambulatory Visit: Payer: Self-pay | Admitting: *Deleted

## 2023-06-25 DIAGNOSIS — M6281 Muscle weakness (generalized): Secondary | ICD-10-CM | POA: Insufficient documentation

## 2023-06-25 DIAGNOSIS — R293 Abnormal posture: Secondary | ICD-10-CM | POA: Diagnosis not present

## 2023-06-25 DIAGNOSIS — R2681 Unsteadiness on feet: Secondary | ICD-10-CM | POA: Insufficient documentation

## 2023-06-25 DIAGNOSIS — R252 Cramp and spasm: Secondary | ICD-10-CM | POA: Insufficient documentation

## 2023-06-25 DIAGNOSIS — R262 Difficulty in walking, not elsewhere classified: Secondary | ICD-10-CM | POA: Diagnosis not present

## 2023-06-25 DIAGNOSIS — I7143 Infrarenal abdominal aortic aneurysm, without rupture: Secondary | ICD-10-CM

## 2023-06-25 DIAGNOSIS — M25562 Pain in left knee: Secondary | ICD-10-CM | POA: Diagnosis not present
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
## 2023-06-30 ENCOUNTER — Other Ambulatory Visit: Payer: Self-pay | Admitting: Family Medicine

## 2023-07-01 ENCOUNTER — Other Ambulatory Visit: Payer: Self-pay

## 2023-07-01 MED ORDER — TAMSULOSIN HCL 0.4 MG PO CAPS: ORAL_CAPSULE | ORAL | 1 refills | Status: DC

## 2023-07-06 ENCOUNTER — Inpatient Hospital Stay: Payer: Self-pay

## 2023-07-06 ENCOUNTER — Inpatient Hospital Stay: Payer: PPO | Attending: Oncology | Admitting: Oncology

## 2023-07-06 ENCOUNTER — Other Ambulatory Visit: Payer: PPO

## 2023-07-06 VITALS — BP 104/83 | HR 70 | Temp 98.1°F | Resp 18 | Ht 68.0 in | Wt 145.0 lb

## 2023-07-06 DIAGNOSIS — C8510 Unspecified B-cell lymphoma, unspecified site: Secondary | ICD-10-CM | POA: Diagnosis not present

## 2023-07-06 DIAGNOSIS — D509 Iron deficiency anemia, unspecified: Secondary | ICD-10-CM | POA: Insufficient documentation

## 2023-07-06 DIAGNOSIS — I509 Heart failure, unspecified: Secondary | ICD-10-CM | POA: Insufficient documentation

## 2023-07-06 DIAGNOSIS — C851A Unspecified b-cell lymphoma, in remission: Secondary | ICD-10-CM | POA: Diagnosis not present

## 2023-07-06 DIAGNOSIS — I739 Peripheral vascular disease, unspecified: Secondary | ICD-10-CM | POA: Diagnosis not present

## 2023-07-06 DIAGNOSIS — I110 Hypertensive heart disease with heart failure: Secondary | ICD-10-CM | POA: Insufficient documentation

## 2023-07-06 DIAGNOSIS — N289 Disorder of kidney and ureter, unspecified: Secondary | ICD-10-CM | POA: Diagnosis not present

## 2023-07-06 DIAGNOSIS — D696 Thrombocytopenia, unspecified: Secondary | ICD-10-CM | POA: Insufficient documentation

## 2023-07-06 LAB — CBC WITH DIFFERENTIAL (CANCER CENTER ONLY)
Abs Immature Granulocytes: 0.03 10*3/uL (ref 0.00–0.07)
Basophils Absolute: 0 10*3/uL (ref 0.0–0.1)
Basophils Relative: 0 %
Eosinophils Absolute: 0.1 10*3/uL (ref 0.0–0.5)
Eosinophils Relative: 2 %
HCT: 38.3 % — ABNORMAL LOW (ref 39.0–52.0)
Hemoglobin: 12.2 g/dL — ABNORMAL LOW (ref 13.0–17.0)
Immature Granulocytes: 0 %
Lymphocytes Relative: 11 %
Lymphs Abs: 0.8 10*3/uL (ref 0.7–4.0)
MCH: 28.6 pg (ref 26.0–34.0)
MCHC: 31.9 g/dL (ref 30.0–36.0)
MCV: 89.9 fL (ref 80.0–100.0)
Monocytes Absolute: 0.6 10*3/uL (ref 0.1–1.0)
Monocytes Relative: 8 %
Neutro Abs: 5.7 10*3/uL (ref 1.7–7.7)
Neutrophils Relative %: 79 %
Platelet Count: 99 10*3/uL — ABNORMAL LOW (ref 150–400)
RBC: 4.26 MIL/uL (ref 4.22–5.81)
RDW: 15.8 % — ABNORMAL HIGH (ref 11.5–15.5)
WBC Count: 7.4 10*3/uL (ref 4.0–10.5)
nRBC: 0 % (ref 0.0–0.2)

## 2023-07-06 LAB — CMP (CANCER CENTER ONLY)
ALT: 6 U/L (ref 0–44)
AST: 8 U/L — ABNORMAL LOW (ref 15–41)
Albumin: 3.7 g/dL (ref 3.5–5.0)
Alkaline Phosphatase: 73 U/L (ref 38–126)
Anion gap: 9 (ref 5–15)
BUN: 56 mg/dL — ABNORMAL HIGH (ref 8–23)
CO2: 24 mmol/L (ref 22–32)
Calcium: 8.4 mg/dL — ABNORMAL LOW (ref 8.9–10.3)
Chloride: 107 mmol/L (ref 98–111)
Creatinine: 2.23 mg/dL — ABNORMAL HIGH (ref 0.61–1.24)
GFR, Estimated: 28 mL/min — ABNORMAL LOW (ref >60)
Glucose, Bld: 116 mg/dL — ABNORMAL HIGH (ref 70–99)
Potassium: 4.6 mmol/L (ref 3.5–5.1)
Sodium: 140 mmol/L (ref 135–145)
Total Bilirubin: 0.3 mg/dL (ref 0.0–1.2)
Total Protein: 6.2 g/dL — ABNORMAL LOW (ref 6.5–8.1)

## 2023-07-06 LAB — LACTATE DEHYDROGENASE: LDH: 140 U/L (ref 98–192)
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
## 2023-07-07 ENCOUNTER — Telehealth: Payer: Self-pay | Admitting: Oncology

## 2023-07-07 NOTE — Telephone Encounter (Signed)
 Contacted pt to schedule an appt per 07/06/23 LOS. Unable to reach via phone, voicemail was left.    Follow-Up Information  Follow-up disposition: Return in about 6 months (around 01/05/2024) for Lab, office.  Check out comments: Office 6 months

## 2023-07-09 ENCOUNTER — Ambulatory Visit: Payer: HMO | Attending: Family Medicine

## 2023-07-12 ENCOUNTER — Ambulatory Visit: Payer: HMO

## 2023-07-15 NOTE — Telephone Encounter (Signed)
 Patient has been scheduled. Aware of appt date and time.

## 2023-07-21 ENCOUNTER — Ambulatory Visit: Payer: HMO | Attending: Family Medicine

## 2023-07-21 DIAGNOSIS — M6281 Muscle weakness (generalized): Secondary | ICD-10-CM | POA: Insufficient documentation

## 2023-07-21 DIAGNOSIS — R2681 Unsteadiness on feet: Secondary | ICD-10-CM | POA: Diagnosis not present

## 2023-07-21 DIAGNOSIS — R293 Abnormal posture: Secondary | ICD-10-CM | POA: Diagnosis not present

## 2023-07-21 DIAGNOSIS — R252 Cramp and spasm: Secondary | ICD-10-CM | POA: Insufficient documentation

## 2023-07-21 DIAGNOSIS — M25562 Pain in left knee: Secondary | ICD-10-CM | POA: Diagnosis not present

## 2023-07-21 DIAGNOSIS — R262 Difficulty in walking, not elsewhere classified: Secondary | ICD-10-CM | POA: Insufficient documentation
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
## 2023-09-22 ENCOUNTER — Ambulatory Visit (INDEPENDENT_AMBULATORY_CARE_PROVIDER_SITE_OTHER): Admitting: Family Medicine

## 2023-09-22 ENCOUNTER — Encounter: Payer: Self-pay | Admitting: Family Medicine

## 2023-09-22 ENCOUNTER — Ambulatory Visit: Payer: HMO | Admitting: Family Medicine

## 2023-09-22 VITALS — BP 110/58 | HR 63 | Temp 98.1°F | Ht 68.0 in | Wt 138.0 lb

## 2023-09-22 DIAGNOSIS — M6284 Sarcopenia: Secondary | ICD-10-CM | POA: Diagnosis not present

## 2023-09-22 DIAGNOSIS — I7143 Infrarenal abdominal aortic aneurysm, without rupture: Secondary | ICD-10-CM

## 2023-09-22 DIAGNOSIS — Z8572 Personal history of non-Hodgkin lymphomas: Secondary | ICD-10-CM

## 2023-09-22 DIAGNOSIS — N183 Chronic kidney disease, stage 3 unspecified: Secondary | ICD-10-CM | POA: Diagnosis not present

## 2023-09-22 DIAGNOSIS — R296 Repeated falls: Secondary | ICD-10-CM | POA: Diagnosis not present

## 2023-09-22 DIAGNOSIS — R64 Cachexia: Secondary | ICD-10-CM | POA: Diagnosis not present

## 2023-09-22 NOTE — Progress Notes (Signed)
 Subjective:    Patient ID: [name] [name], male    DOB: 1934-04-01, 89 y.o.   MRN: [id number]  HPI Follow up of chronic medical conditions Discussed the use of AI scribe software for clinical note transcription with the patient, who gave verbal consent to proceed.  History of Present Illness   [name] [name] is an 89 year old male who presents with decreased appetite and reduced physical activity.  He has experienced a significant decrease in physical activity, primarily moving from his bedroom to his uplift chair where he spends most of his time sitting, sleeping, and occasionally watching westerns. He does not walk much, even within the house, raising concerns about his mobility and potential muscle weakness.  There is a notable decrease in appetite. While he eats breakfast, he often refuses other meals, showing no interest in eating, even when offered foods he previously enjoyed, such as milkshakes. His diet mainly consists of watermelon, ice cream, and honey buns. He has been losing weight over the past year and a half to two years.  His awareness fluctuates; he is generally not very alert unless visitors are present, at which point he becomes more attentive. He has a history of cancer, and his family reports that his oncologist said the cancer is gone. He sees his oncologist once a year. He also has a vascular condition, and his family reports that the doctors have said there are limited treatment options for his stomach issue.  He has been reducing his fluid intake, primarily consuming caffeinated drinks like [employer] and lemonade. He occasionally drinks protein shakes, although not consistently. There is concern about his hydration status, especially given the hot weather.  No recent colds or flus in the past two years.        09/22/2023    1:34 PM 05/24/2023    4:06 PM 01/15/2023   10:59 AM 01/15/2023   10:58 AM 06/19/2022   11:21 AM  Depression screen PHQ 2/9   Decreased Interest 3 3 0 1 1  Down, Depressed, Hopeless 0 3 0 1 1  PHQ - 2 Score 3 6 0 2 2  Altered sleeping 3 0 0 0 0  Tired, decreased energy 3 3 0 3 2  Change in appetite 3 3 0 3 0  Feeling bad or failure about yourself  0 0 0 1 0  Trouble concentrating 0 3 0 2 1  Moving slowly or fidgety/restless 0 3 0 2 0  Suicidal thoughts 0  0 0 0  PHQ-9 Score 12 18 0 13 5  Difficult doing work/chores Not difficult at all         Review of Systems     Objective:   Physical Exam General-in no acute distress Eyes-no discharge Lungs-respiratory rate normal, CTA CV-no murmurs,RRR Extremities skin warm dry no edema Neuro grossly normal Behavior normal, alert        Assessment & Plan:  1. History of B-cell lymphoma (Primary) Labs ordered Recent evaluation by oncology was negative recent scans looked good with - CBC with Differential - Hepatic Function Panel - Basic Metabolic Panel  2. Stage 3 chronic kidney disease, unspecified whether stage 3a or 3b CKD (HCC) Will follow-up on metabolic 7 patient not eating or drinking well - CBC with Differential - Hepatic Function Panel - Basic Metabolic Panel  3. Sarcopenia Losing muscle mass related to age  65. Cachexia [name]) Patient is steadily going down weight wise encouraged him to try to do  a better job of oral intake and protein intake family working with him as best as possible lab work ordered await results go from there  5. Frequent falls He primarily uses a wheelchair when outside the office and outside of his home family is careful with him transferring to minimize risk of falls  6. Infrarenal abdominal aortic aneurysm (AAA) without rupture (HCC) Inoperable according to specialist  Follow-up 4 months follow-up sooner problems

## 2023-09-23 ENCOUNTER — Emergency Department (HOSPITAL_COMMUNITY)

## 2023-09-23 ENCOUNTER — Inpatient Hospital Stay (HOSPITAL_COMMUNITY)
Admission: EM | Admit: 2023-09-23 | Discharge: 2023-09-26 | DRG: 683 | Disposition: A | Discharge status: Home Health | Attending: Internal Medicine | Admitting: Internal Medicine

## 2023-09-23 ENCOUNTER — Encounter (HOSPITAL_COMMUNITY): Payer: Self-pay

## 2023-09-23 ENCOUNTER — Ambulatory Visit: Payer: Self-pay | Admitting: Family Medicine

## 2023-09-23 ENCOUNTER — Other Ambulatory Visit: Payer: Self-pay

## 2023-09-23 DIAGNOSIS — I10 Essential (primary) hypertension: Secondary | ICD-10-CM | POA: Insufficient documentation

## 2023-09-23 DIAGNOSIS — C8580 Other specified types of non-Hodgkin lymphoma, unspecified site: Secondary | ICD-10-CM | POA: Diagnosis present

## 2023-09-23 DIAGNOSIS — Z888 Allergy status to other drugs, medicaments and biological substances status: Secondary | ICD-10-CM

## 2023-09-23 DIAGNOSIS — R935 Abnormal findings on diagnostic imaging of other abdominal regions, including retroperitoneum: Secondary | ICD-10-CM | POA: Diagnosis not present

## 2023-09-23 DIAGNOSIS — Z83438 Family history of other disorder of lipoprotein metabolism and other lipidemia: Secondary | ICD-10-CM

## 2023-09-23 DIAGNOSIS — I7143 Infrarenal abdominal aortic aneurysm, without rupture: Secondary | ICD-10-CM | POA: Diagnosis present

## 2023-09-23 DIAGNOSIS — N179 Acute kidney failure, unspecified: Principal | ICD-10-CM | POA: Diagnosis present

## 2023-09-23 DIAGNOSIS — Z9861 Coronary angioplasty status: Secondary | ICD-10-CM | POA: Diagnosis not present

## 2023-09-23 DIAGNOSIS — Z887 Allergy status to serum and vaccine status: Secondary | ICD-10-CM

## 2023-09-23 DIAGNOSIS — I739 Peripheral vascular disease, unspecified: Secondary | ICD-10-CM | POA: Diagnosis present

## 2023-09-23 DIAGNOSIS — K449 Diaphragmatic hernia without obstruction or gangrene: Secondary | ICD-10-CM | POA: Diagnosis not present

## 2023-09-23 DIAGNOSIS — I130 Hypertensive heart and chronic kidney disease with heart failure and stage 1 through stage 4 chronic kidney disease, or unspecified chronic kidney disease: Secondary | ICD-10-CM | POA: Diagnosis present

## 2023-09-23 DIAGNOSIS — I447 Left bundle-branch block, unspecified: Secondary | ICD-10-CM | POA: Diagnosis present

## 2023-09-23 DIAGNOSIS — Z860101 Personal history of adenomatous and serrated colon polyps: Secondary | ICD-10-CM

## 2023-09-23 DIAGNOSIS — K5730 Diverticulosis of large intestine without perforation or abscess without bleeding: Secondary | ICD-10-CM | POA: Diagnosis not present

## 2023-09-23 DIAGNOSIS — I5022 Chronic systolic (congestive) heart failure: Secondary | ICD-10-CM | POA: Diagnosis not present

## 2023-09-23 DIAGNOSIS — Z809 Family history of malignant neoplasm, unspecified: Secondary | ICD-10-CM

## 2023-09-23 DIAGNOSIS — D631 Anemia in chronic kidney disease: Secondary | ICD-10-CM | POA: Diagnosis present

## 2023-09-23 DIAGNOSIS — I160 Hypertensive urgency: Secondary | ICD-10-CM

## 2023-09-23 DIAGNOSIS — I2510 Atherosclerotic heart disease of native coronary artery without angina pectoris: Secondary | ICD-10-CM | POA: Diagnosis not present

## 2023-09-23 DIAGNOSIS — I714 Abdominal aortic aneurysm, without rupture, unspecified: Secondary | ICD-10-CM | POA: Diagnosis present

## 2023-09-23 DIAGNOSIS — N1832 Chronic kidney disease, stage 3b: Secondary | ICD-10-CM | POA: Diagnosis not present

## 2023-09-23 DIAGNOSIS — Z8249 Family history of ischemic heart disease and other diseases of the circulatory system: Secondary | ICD-10-CM

## 2023-09-23 DIAGNOSIS — D696 Thrombocytopenia, unspecified: Secondary | ICD-10-CM | POA: Diagnosis present

## 2023-09-23 DIAGNOSIS — I5023 Acute on chronic systolic (congestive) heart failure: Secondary | ICD-10-CM | POA: Insufficient documentation

## 2023-09-23 DIAGNOSIS — I252 Old myocardial infarction: Secondary | ICD-10-CM

## 2023-09-23 DIAGNOSIS — Z7902 Long term (current) use of antithrombotics/antiplatelets: Secondary | ICD-10-CM

## 2023-09-23 DIAGNOSIS — E782 Mixed hyperlipidemia: Secondary | ICD-10-CM | POA: Diagnosis not present

## 2023-09-23 DIAGNOSIS — E785 Hyperlipidemia, unspecified: Secondary | ICD-10-CM | POA: Diagnosis present

## 2023-09-23 DIAGNOSIS — Z955 Presence of coronary angioplasty implant and graft: Secondary | ICD-10-CM

## 2023-09-23 DIAGNOSIS — N400 Enlarged prostate without lower urinary tract symptoms: Secondary | ICD-10-CM | POA: Diagnosis present

## 2023-09-23 DIAGNOSIS — K219 Gastro-esophageal reflux disease without esophagitis: Secondary | ICD-10-CM | POA: Diagnosis present

## 2023-09-23 DIAGNOSIS — Z8673 Personal history of transient ischemic attack (TIA), and cerebral infarction without residual deficits: Secondary | ICD-10-CM

## 2023-09-23 DIAGNOSIS — I161 Hypertensive emergency: Secondary | ICD-10-CM | POA: Diagnosis present

## 2023-09-23 DIAGNOSIS — Z833 Family history of diabetes mellitus: Secondary | ICD-10-CM

## 2023-09-23 DIAGNOSIS — C833A Diffuse large b-cell lymphoma, in remission: Secondary | ICD-10-CM | POA: Diagnosis present

## 2023-09-23 DIAGNOSIS — Z87891 Personal history of nicotine dependence: Secondary | ICD-10-CM

## 2023-09-23 DIAGNOSIS — Z79899 Other long term (current) drug therapy: Secondary | ICD-10-CM

## 2023-09-23 DIAGNOSIS — N19 Unspecified kidney failure: Secondary | ICD-10-CM | POA: Diagnosis not present

## 2023-09-23 LAB — CBC WITH DIFFERENTIAL/PLATELET
Basophils Absolute: 0 10*3/uL (ref 0.0–0.2)
Basos: 0 %
EOS (ABSOLUTE): 0.1 10*3/uL (ref 0.0–0.4)
Eos: 2 %
Hematocrit: 38.2 % (ref 37.5–51.0)
Hemoglobin: 12.1 g/dL — ABNORMAL LOW (ref 13.0–17.7)
Immature Grans (Abs): 0 10*3/uL (ref 0.0–0.1)
Immature Granulocytes: 0 %
Lymphocytes Absolute: 0.8 10*3/uL (ref 0.7–3.1)
Lymphs: 11 %
MCH: 29.1 pg (ref 26.6–33.0)
MCHC: 31.7 g/dL (ref 31.5–35.7)
MCV: 92 fL (ref 79–97)
Monocytes Absolute: 0.6 10*3/uL (ref 0.1–0.9)
Monocytes: 9 %
Neutrophils Absolute: 5.4 10*3/uL (ref 1.4–7.0)
Neutrophils: 78 %
Platelets: 87 10*3/uL — CL (ref 150–450)
RBC: 4.16 x10E6/uL (ref 4.14–5.80)
RDW: 14.5 % (ref 11.6–15.4)
WBC: 6.9 10*3/uL (ref 3.4–10.8)

## 2023-09-23 LAB — CBC
HCT: 37.7 % — ABNORMAL LOW (ref 39.0–52.0)
Hemoglobin: 12.1 g/dL — ABNORMAL LOW (ref 13.0–17.0)
MCH: 28.6 pg (ref 26.0–34.0)
MCHC: 32.1 g/dL (ref 30.0–36.0)
MCV: 89.1 fL (ref 80.0–100.0)
Platelets: 85 10*3/uL — ABNORMAL LOW (ref 150–400)
RBC: 4.23 MIL/uL (ref 4.22–5.81)
RDW: 14.8 % (ref 11.5–15.5)
WBC: 6.9 10*3/uL (ref 4.0–10.5)
nRBC: 0 % (ref 0.0–0.2)

## 2023-09-23 LAB — COMPREHENSIVE METABOLIC PANEL WITH GFR
ALT: 11 U/L (ref 0–44)
AST: 8 U/L — ABNORMAL LOW (ref 15–41)
Albumin: 3.2 g/dL — ABNORMAL LOW (ref 3.5–5.0)
Alkaline Phosphatase: 57 U/L (ref 38–126)
Anion gap: 15 (ref 5–15)
BUN: 124 mg/dL — ABNORMAL HIGH (ref 8–23)
CO2: 18 mmol/L — ABNORMAL LOW (ref 22–32)
Calcium: 7.8 mg/dL — ABNORMAL LOW (ref 8.9–10.3)
Chloride: 109 mmol/L (ref 98–111)
Creatinine, Ser: 3.85 mg/dL — ABNORMAL HIGH (ref 0.61–1.24)
GFR, Estimated: 15 mL/min — ABNORMAL LOW (ref >60)
Glucose, Bld: 128 mg/dL — ABNORMAL HIGH (ref 70–99)
Potassium: 4.1 mmol/L (ref 3.5–5.1)
Sodium: 142 mmol/L (ref 135–145)
Total Bilirubin: 0.4 mg/dL (ref 0.0–1.2)
Total Protein: 6.1 g/dL — ABNORMAL LOW (ref 6.5–8.1)

## 2023-09-23 LAB — URINALYSIS, ROUTINE W REFLEX MICROSCOPIC
Bilirubin Urine: NEGATIVE
Glucose, UA: NEGATIVE mg/dL
Ketones, ur: NEGATIVE mg/dL
Leukocytes,Ua: NEGATIVE
Nitrite: NEGATIVE
Protein, ur: NEGATIVE mg/dL
Specific Gravity, Urine: 1.014 (ref 1.005–1.030)
pH: 5 (ref 5.0–8.0)

## 2023-09-23 LAB — BASIC METABOLIC PANEL WITH GFR
BUN/Creatinine Ratio: 28 — ABNORMAL HIGH (ref 10–24)
BUN: 112 mg/dL (ref 8–27)
CO2: 15 mmol/L — ABNORMAL LOW (ref 20–29)
Calcium: 7.8 mg/dL — ABNORMAL LOW (ref 8.6–10.2)
Chloride: 106 mmol/L (ref 96–106)
Creatinine, Ser: 3.99 mg/dL — ABNORMAL HIGH (ref 0.76–1.27)
Glucose: 115 mg/dL — ABNORMAL HIGH (ref 70–99)
Potassium: 4.6 mmol/L (ref 3.5–5.2)
Sodium: 142 mmol/L (ref 134–144)
eGFR: 14 mL/min/{1.73_m2} — ABNORMAL LOW (ref >59)

## 2023-09-23 LAB — HEPATIC FUNCTION PANEL
ALT: 8 IU/L (ref 0–44)
AST: 7 IU/L (ref 0–40)
Albumin: 3.8 g/dL (ref 3.7–4.7)
Alkaline Phosphatase: 76 IU/L (ref 44–121)
Bilirubin Total: 0.3 mg/dL (ref 0.0–1.2)
Bilirubin, Direct: 0.17 mg/dL (ref 0.00–0.40)
Total Protein: 6.1 g/dL (ref 6.0–8.5)

## 2023-09-23 NOTE — ED Triage Notes (Signed)
 Pt brought in by son who reports his doctor told him to come to the ER for new onset of kidney failure due to increased BUN of 112 and creatinine of 3.99, according to blood work done yesterday.

## 2023-09-24 ENCOUNTER — Other Ambulatory Visit: Payer: Self-pay

## 2023-09-24 ENCOUNTER — Encounter (HOSPITAL_COMMUNITY): Payer: Self-pay | Admitting: Internal Medicine

## 2023-09-24 DIAGNOSIS — I5022 Chronic systolic (congestive) heart failure: Secondary | ICD-10-CM | POA: Insufficient documentation

## 2023-09-24 DIAGNOSIS — Z8673 Personal history of transient ischemic attack (TIA), and cerebral infarction without residual deficits: Secondary | ICD-10-CM | POA: Diagnosis not present

## 2023-09-24 DIAGNOSIS — D631 Anemia in chronic kidney disease: Secondary | ICD-10-CM | POA: Diagnosis not present

## 2023-09-24 DIAGNOSIS — N19 Unspecified kidney failure: Secondary | ICD-10-CM | POA: Insufficient documentation

## 2023-09-24 DIAGNOSIS — Z9861 Coronary angioplasty status: Secondary | ICD-10-CM

## 2023-09-24 DIAGNOSIS — E782 Mixed hyperlipidemia: Secondary | ICD-10-CM

## 2023-09-24 DIAGNOSIS — I447 Left bundle-branch block, unspecified: Secondary | ICD-10-CM | POA: Diagnosis not present

## 2023-09-24 DIAGNOSIS — I252 Old myocardial infarction: Secondary | ICD-10-CM | POA: Diagnosis not present

## 2023-09-24 DIAGNOSIS — I10 Essential (primary) hypertension: Secondary | ICD-10-CM | POA: Insufficient documentation

## 2023-09-24 DIAGNOSIS — I161 Hypertensive emergency: Secondary | ICD-10-CM | POA: Diagnosis not present

## 2023-09-24 DIAGNOSIS — R935 Abnormal findings on diagnostic imaging of other abdominal regions, including retroperitoneum: Secondary | ICD-10-CM | POA: Diagnosis not present

## 2023-09-24 DIAGNOSIS — I130 Hypertensive heart and chronic kidney disease with heart failure and stage 1 through stage 4 chronic kidney disease, or unspecified chronic kidney disease: Secondary | ICD-10-CM | POA: Diagnosis not present

## 2023-09-24 DIAGNOSIS — D696 Thrombocytopenia, unspecified: Secondary | ICD-10-CM | POA: Diagnosis not present

## 2023-09-24 DIAGNOSIS — I2510 Atherosclerotic heart disease of native coronary artery without angina pectoris: Secondary | ICD-10-CM | POA: Diagnosis not present

## 2023-09-24 DIAGNOSIS — N400 Enlarged prostate without lower urinary tract symptoms: Secondary | ICD-10-CM | POA: Diagnosis not present

## 2023-09-24 DIAGNOSIS — Z887 Allergy status to serum and vaccine status: Secondary | ICD-10-CM | POA: Diagnosis not present

## 2023-09-24 DIAGNOSIS — E785 Hyperlipidemia, unspecified: Secondary | ICD-10-CM | POA: Diagnosis not present

## 2023-09-24 DIAGNOSIS — I7143 Infrarenal abdominal aortic aneurysm, without rupture: Secondary | ICD-10-CM | POA: Diagnosis not present

## 2023-09-24 DIAGNOSIS — I160 Hypertensive urgency: Secondary | ICD-10-CM | POA: Diagnosis not present

## 2023-09-24 DIAGNOSIS — N1832 Chronic kidney disease, stage 3b: Secondary | ICD-10-CM

## 2023-09-24 DIAGNOSIS — K449 Diaphragmatic hernia without obstruction or gangrene: Secondary | ICD-10-CM | POA: Diagnosis not present

## 2023-09-24 DIAGNOSIS — Z955 Presence of coronary angioplasty implant and graft: Secondary | ICD-10-CM | POA: Diagnosis not present

## 2023-09-24 DIAGNOSIS — Z79899 Other long term (current) drug therapy: Secondary | ICD-10-CM | POA: Diagnosis not present

## 2023-09-24 DIAGNOSIS — K5730 Diverticulosis of large intestine without perforation or abscess without bleeding: Secondary | ICD-10-CM | POA: Diagnosis not present

## 2023-09-24 DIAGNOSIS — I5023 Acute on chronic systolic (congestive) heart failure: Secondary | ICD-10-CM | POA: Insufficient documentation

## 2023-09-24 DIAGNOSIS — K219 Gastro-esophageal reflux disease without esophagitis: Secondary | ICD-10-CM | POA: Diagnosis not present

## 2023-09-24 DIAGNOSIS — N179 Acute kidney failure, unspecified: Secondary | ICD-10-CM | POA: Diagnosis not present

## 2023-09-24 DIAGNOSIS — Z87891 Personal history of nicotine dependence: Secondary | ICD-10-CM | POA: Diagnosis not present

## 2023-09-24 DIAGNOSIS — Z7902 Long term (current) use of antithrombotics/antiplatelets: Secondary | ICD-10-CM | POA: Diagnosis not present

## 2023-09-24 DIAGNOSIS — Z888 Allergy status to other drugs, medicaments and biological substances status: Secondary | ICD-10-CM | POA: Diagnosis not present

## 2023-09-24 DIAGNOSIS — Z833 Family history of diabetes mellitus: Secondary | ICD-10-CM | POA: Diagnosis not present

## 2023-09-24 DIAGNOSIS — Z8249 Family history of ischemic heart disease and other diseases of the circulatory system: Secondary | ICD-10-CM | POA: Diagnosis not present

## 2023-09-24 DIAGNOSIS — C833A Diffuse large b-cell lymphoma, in remission: Secondary | ICD-10-CM | POA: Diagnosis not present

## 2023-09-24 LAB — CBC
HCT: 35.7 % — ABNORMAL LOW (ref 39.0–52.0)
Hemoglobin: 11.3 g/dL — ABNORMAL LOW (ref 13.0–17.0)
MCH: 29.7 pg (ref 26.0–34.0)
MCHC: 31.7 g/dL (ref 30.0–36.0)
MCV: 93.9 fL (ref 80.0–100.0)
Platelets: 70 10*3/uL — ABNORMAL LOW (ref 150–400)
RBC: 3.8 MIL/uL — ABNORMAL LOW (ref 4.22–5.81)
RDW: 14.8 % (ref 11.5–15.5)
WBC: 6 10*3/uL (ref 4.0–10.5)
nRBC: 0 % (ref 0.0–0.2)

## 2023-09-24 LAB — COMPREHENSIVE METABOLIC PANEL WITH GFR
ALT: 9 U/L (ref 0–44)
AST: 8 U/L — ABNORMAL LOW (ref 15–41)
Albumin: 2.8 g/dL — ABNORMAL LOW (ref 3.5–5.0)
Alkaline Phosphatase: 48 U/L (ref 38–126)
Anion gap: 15 (ref 5–15)
BUN: 112 mg/dL — ABNORMAL HIGH (ref 8–23)
CO2: 14 mmol/L — ABNORMAL LOW (ref 22–32)
Calcium: 7.5 mg/dL — ABNORMAL LOW (ref 8.9–10.3)
Chloride: 112 mmol/L — ABNORMAL HIGH (ref 98–111)
Creatinine, Ser: 3.11 mg/dL — ABNORMAL HIGH (ref 0.61–1.24)
GFR, Estimated: 19 mL/min — ABNORMAL LOW (ref >60)
Glucose, Bld: 92 mg/dL (ref 70–99)
Potassium: 3.7 mmol/L (ref 3.5–5.1)
Sodium: 141 mmol/L (ref 135–145)
Total Bilirubin: 0.5 mg/dL (ref 0.0–1.2)
Total Protein: 5.3 g/dL — ABNORMAL LOW (ref 6.5–8.1)

## 2023-09-24 LAB — CK: Total CK: 43 U/L — ABNORMAL LOW (ref 49–397)

## 2023-09-24 MED ORDER — ATORVASTATIN CALCIUM 10 MG PO TABS
20.0000 mg | ORAL_TABLET | Freq: Every day | ORAL | Status: DC
  Administered 2023-09-24 – 2023-09-26 (×3): 20 mg via ORAL
  Filled 2023-09-24 (×3): qty 2

## 2023-09-24 MED ORDER — SODIUM CHLORIDE 0.45 % IV SOLN: INTRAVENOUS | Status: DC

## 2023-09-24 MED ORDER — HEPARIN SODIUM (PORCINE) 5000 UNIT/ML IJ SOLN
5000.0000 [IU] | Freq: Three times a day (TID) | INTRAMUSCULAR | Status: DC
  Administered 2023-09-24 – 2023-09-26 (×6): 5000 [IU] via SUBCUTANEOUS
  Filled 2023-09-24 (×7): qty 1

## 2023-09-24 MED ORDER — SACUBITRIL-VALSARTAN 49-51 MG PO TABS
1.0000 | ORAL_TABLET | Freq: Once | ORAL | Status: AC
  Administered 2023-09-24: 1 via ORAL
  Filled 2023-09-24: qty 1

## 2023-09-24 MED ORDER — ENSURE PLUS HIGH PROTEIN PO LIQD
237.0000 mL | Freq: Two times a day (BID) | ORAL | Status: DC
  Administered 2023-09-25 – 2023-09-26 (×3): 237 mL via ORAL

## 2023-09-24 MED ORDER — HYDRALAZINE HCL 20 MG/ML IJ SOLN
5.0000 mg | INTRAMUSCULAR | Status: DC | PRN
  Administered 2023-09-24: 5 mg via INTRAVENOUS
  Filled 2023-09-24 (×2): qty 1

## 2023-09-24 MED ORDER — TAMSULOSIN HCL 0.4 MG PO CAPS
0.4000 mg | ORAL_CAPSULE | Freq: Every day | ORAL | Status: DC
  Administered 2023-09-24 – 2023-09-25 (×2): 0.4 mg via ORAL
  Filled 2023-09-24 (×2): qty 1

## 2023-09-24 MED ORDER — SODIUM CHLORIDE 0.9 % IV SOLN: INTRAVENOUS | Status: DC

## 2023-09-24 MED ORDER — CARVEDILOL 6.25 MG PO TABS
6.2500 mg | ORAL_TABLET | Freq: Two times a day (BID) | ORAL | Status: DC
  Administered 2023-09-24 – 2023-09-25 (×3): 6.25 mg via ORAL
  Filled 2023-09-24 (×3): qty 1

## 2023-09-24 MED ORDER — SODIUM CHLORIDE 0.9 % IV BOLUS
500.0000 mL | Freq: Once | INTRAVENOUS | Status: AC
  Administered 2023-09-24: 500 mL via INTRAVENOUS

## 2023-09-24 MED ORDER — CLOPIDOGREL BISULFATE 75 MG PO TABS
75.0000 mg | ORAL_TABLET | Freq: Every day | ORAL | Status: DC
  Administered 2023-09-24 – 2023-09-26 (×3): 75 mg via ORAL
  Filled 2023-09-24 (×3): qty 1

## 2023-09-24 MED ORDER — HYDRALAZINE HCL 50 MG PO TABS
50.0000 mg | ORAL_TABLET | Freq: Three times a day (TID) | ORAL | Status: DC
  Administered 2023-09-24 – 2023-09-26 (×6): 50 mg via ORAL
  Filled 2023-09-24 (×2): qty 1
  Filled 2023-09-24: qty 2
  Filled 2023-09-24 (×4): qty 1

## 2023-09-24 MED ORDER — CARVEDILOL 3.125 MG PO TABS
6.2500 mg | ORAL_TABLET | Freq: Once | ORAL | Status: AC
  Administered 2023-09-24: 6.25 mg via ORAL
  Filled 2023-09-24: qty 2

## 2023-09-24 NOTE — Assessment & Plan Note (Signed)
 Follow up as outpatient.  Continue blood pressure control.

## 2023-09-24 NOTE — Assessment & Plan Note (Signed)
 Follow up as outpatient At the time of discharge thrombocytopenia stable at 70, with wbc at 6,0 and hgb at 11.3 (chronic anemia)

## 2023-09-24 NOTE — Assessment & Plan Note (Signed)
No chest pain, no acute coronary syndrome.  

## 2023-09-24 NOTE — Plan of Care (Signed)
   Problem: Education: Goal: Knowledge of General Education information will improve Description Including pain rating scale, medication(s)/side effects and non-pharmacologic comfort measures Outcome: Progressing   Problem: Clinical Measurements: Goal: Ability to maintain clinical measurements within normal limits will improve Outcome: Progressing   Problem: Activity: Goal: Risk for activity intolerance will decrease Outcome: Progressing

## 2023-09-24 NOTE — Assessment & Plan Note (Signed)
 Hypertensive emergency has resolved.  During his hospitalization he received po hydralazine  50 mg tid.  Continue blood pressure control amlodipine  and carvedilol .  Hold on entresto  for now.

## 2023-09-24 NOTE — Assessment & Plan Note (Signed)
 Continue statin therapy.

## 2023-09-24 NOTE — Assessment & Plan Note (Signed)
 AKI   Renal function with serum cr at 3,11 with K at 3,7 and serum bicarbonate at 14  Na 141 Cl 112   Continue IV fluids with 0,45% saline at 50 ml per hr Diet with no restrictions Follow up renal function and electrolytes in am Avoid hypotension or nephrotoxic medications

## 2023-09-24 NOTE — Progress Notes (Signed)
 Progress Note   Patient: [name] [name] FMW:[id number] DOB: 12-29-1934 DOA: 09/23/2023     0 DOS: the patient was seen and examined on 09/24/2023   Brief hospital course: [name] [name] was admitted to the hospital with the working diagnosis of acute renal failure   89 yo male with the past medical history of heart failure, coronary artery disease, B cell lymphoma and abdominal aortic aneurysm who presented with worsening renal failure. Patient reported not eating well for a few weeks, he had a follow up with his primary care and was found to have worsening serum cr up to 3,9 from 2,3 two months prior. He was advice to come to the ED. On his initial physical examination his blood pressure was 209/91 and 141/100, HR 64, and 02 saturation 100%  Lungs with no rales or wheezing, heart with S1 and S2 present and regular with no gallops, rubs or murmurs, abdomen with no distention and no lower extremity edema.   Na 142, K 4.1 Cl 109, bicarbonate 18 glucose 118 bun 124 cr 3,85  AST 8 ALT 11  Wbc 6,9 hgb 12.1 plt 85  Urine analysis SG 1,014, negative protein, small hgb and negative leukocytes, 0-5 rbc, 0-5 wbc   Renal CT with no acute localizing process in the abdomen or pelvis  Stable 6,3 cm infrarenal abdominal aortic aneurysm.    Assessment and Plan: * CKD stage 3b, GFR 30-44 ml/min (HCC) AKI   Renal function with serum cr at 3,11 with K at 3,7 and serum bicarbonate at 14  Na 141 Cl 112   Continue IV fluids with 0,45% saline at 50 ml per hr Diet with no restrictions Follow up renal function and electrolytes in am Avoid hypotension or nephrotoxic medications   Chronic systolic CHF (congestive heart failure) (HCC) 2024 echocardiogram with reduced LV systolic function with EF 30 to 35%, global hypokinesis, severe LVH, RV systolic function preserved, LA with severe dilatation, RA with mild dilatation, no significant valvular disease.   Continue to hold on entresto  and diuretic therapy   Continue carvedilol  and afterload reduction with hydralazine    Hypertensive urgency Continue blood pressure control with hydralazine  and carvedilol .  Hold on entresto  due to acutely reduced GFR.   CAD S/P percutaneous coronary angioplasty No chest pain, no acute coronary syndrome   Hyperlipidemia Continue statin therapy   AAA (abdominal aortic aneurysm) (HCC) Follow up as outpatient  Continue blood pressure control   BPH (benign prostatic hyperplasia) No signs of urinary retention  Continue tamsulosin    Large cell lymphoma (HCC) Follow up as outpatient       Subjective: patient is feeling better, no chest pain or dyspnea, no PND, orthopnea or lower extremity edema   Physical Exam: Vitals:   09/24/23 0500 09/24/23 0600 09/24/23 0644 09/24/23 0844  BP: (!) 156/72 (!) 197/95 (!) 167/84   Pulse: 61  73   Resp: (!) 22  (!) 23   Temp:    98 F (36.7 C)  TempSrc:      SpO2: 97%  100%   Weight:      Height:       Neurology awake and alert ENT with mild pallor Cardiovascular with S1 and S2 present and regular with no gallops, rubs or murmurs No JVD Respiratory with no rales or wheezing, no rhonchi  Abdomen with no distention  No lower extremity edema  Data Reviewed:    Family Communication: no family at the bedside   Disposition: Status is: Inpatient Remains inpatient  appropriate because: IV fluids   Planned Discharge Destination: Home     Author: Elidia Toribio Furnace, MD 09/24/2023 8:55 AM  For on call review [email or web address].

## 2023-09-24 NOTE — ED Notes (Signed)
 Admitting md at bedside

## 2023-09-24 NOTE — ED Provider Notes (Signed)
 [facility] Provider Note   CSN: [id number] Arrival date & time: 09/23/23  8076     Patient presents with: Abnormal Lab   [name] [name] is a 89 y.o. male.    Abnormal Lab  89 year old male with history of coronary artery disease, CHF, hyperlipidemia, arthritis, hypertension, GERD, prior MI, presenting to the ED with abnormal labs.  Seen by PCP yesterday for routine physical and had screening lab work done with a creatinine of 3.99 and BUN of 112.  He was encouraged to come to the ED for admission.  Son is at bedside, reports he has been doing poorly over the past few months.  States he mostly drinks [employer] and coffee, skips a lot of meals but is usually pretty good about eating breakfast.  Some days are better than others.  He does have advanced heart failure and unknown aneurysm that unfortunately has been deemed inoperable.  Patient is hypertensive during evaluation, missed his nighttime BP meds as he was in the waiting room  Prior to Admission medications   Medication Sig Start Date End Date Taking? Authorizing Provider  acetaminophen  (TYLENOL ) 500 MG tablet Take 500 mg by mouth every 6 (six) hours as needed for mild pain or moderate pain.     [provider]  atorvastatin  (LIPITOR) 20 MG tablet TAKE 1 TABLET(20 MG) BY MOUTH DAILY 11/16/22   [facility], Jon Garre, PA  carvedilol  (COREG ) 6.25 MG tablet TAKE 1 TABLET BY MOUTH TWICE A DAY WITH FOOD 05/20/23   Court Dorn PARAS, MD  clopidogrel  (PLAVIX ) 75 MG tablet TAKE 1 TABLET BY MOUTH EVERY DAY 05/20/23   Court Dorn PARAS, MD  furosemide  (LASIX ) 20 MG tablet TAKE 1 TABLET BY MOUTH EVERY DAY 05/20/23   Court Dorn PARAS, MD  methylcellulose (ARTIFICIAL TEARS) 1 % ophthalmic solution Place 1 drop into both eyes daily. Patient not taking: Reported on 09/22/2023    [provider]  pantoprazole  (PROTONIX ) 20 MG tablet TAKE 1 TABLET BY MOUTH TWICE A DAY 02/08/23   Alphonsa Glendia LABOR, MD  sacubitril -valsartan  (ENTRESTO ) 49-51 MG Take 1 tablet by mouth 2 (two) times daily. 02/18/23   Court Dorn PARAS, MD  tamsulosin  (FLOMAX ) 0.4 MG CAPS capsule TAKE 1 CAPSULE EACH EVENING 07/01/23   Alphonsa Glendia LABOR, MD  traMADol  (ULTRAM ) 50 MG tablet TAKE 1 TABLET BY MOUTH EVERY 8 HOURS AS NEEDED 05/26/23   Alphonsa Glendia LABOR, MD    Allergies: Brilinta  [ticagrelor ], Influenza vaccines, and Fluoride preparations    Review of Systems  Constitutional:        Abnormal labs  All other systems reviewed and are negative.   Updated Vital Signs BP (!) 209/93 (BP Location: Right Arm)   Pulse 69   Temp 98.4 F (36.9 C) (Oral)   Resp 16   Ht 5' 8 (1.727 m)   Wt 62.6 kg   SpO2 100%   BMI 20.98 kg/m   Physical Exam Vitals and nursing note reviewed.  Constitutional:      Appearance: He is well-developed.     Comments: Elderly, somewhat hard of hearing  HENT:     Head: Normocephalic and atraumatic.   Eyes:     Conjunctiva/sclera: Conjunctivae normal.     Pupils: Pupils are equal, round, and reactive to light.    Cardiovascular:     Rate and Rhythm: Normal rate and regular rhythm.     Heart sounds: Normal heart sounds.  Pulmonary:  Effort: Pulmonary effort is normal.     Breath sounds: Normal breath sounds.  Abdominal:     General: Bowel sounds are normal.     Palpations: Abdomen is soft.   Musculoskeletal:        General: Normal range of motion.     Cervical back: Normal range of motion.   Skin:    General: Skin is warm and dry.   Neurological:     Mental Status: He is alert and oriented to person, place, and time.     (all labs ordered are listed, but only abnormal results are displayed) Labs Reviewed  URINALYSIS, ROUTINE W REFLEX MICROSCOPIC - Abnormal; Notable for the following components:      Result Value   Hgb urine dipstick SMALL (*)    Bacteria, UA RARE (*)    All other components within normal limits  CBC - Abnormal; Notable for the following  components:   Hemoglobin 12.1 (*)    HCT 37.7 (*)    Platelets 85 (*)    All other components within normal limits  COMPREHENSIVE METABOLIC PANEL WITH GFR - Abnormal; Notable for the following components:   CO2 18 (*)    Glucose, Bld 128 (*)    BUN 124 (*)    Creatinine, Ser 3.85 (*)    Calcium  7.8 (*)    Total Protein 6.1 (*)    Albumin 3.2 (*)    AST 8 (*)    GFR, Estimated 15 (*)    All other components within normal limits    EKG: None  Radiology: CT Renal Stone Study Result Date: 09/23/2023 CLINICAL DATA:  Flank and abdominal pain. EXAM: CT ABDOMEN AND PELVIS WITHOUT CONTRAST TECHNIQUE: Multidetector CT imaging of the abdomen and pelvis was performed following the standard protocol without IV contrast. RADIATION DOSE REDUCTION: This exam was performed according to the departmental dose-optimization program which includes automated exposure control, adjustment of the mA and/or kV according to patient size and/or use of iterative reconstruction technique. COMPARISON:  CT chest abdomen and pelvis 06/09/2023. FINDINGS: Lower chest: There is some minimal peripheral reticular opacities in the lung bases. The heart is enlarged. Hepatobiliary: No focal liver abnormality is seen. No gallstones, gallbladder wall thickening, or biliary dilatation. Calcified granulomas are present. Pancreas: Unremarkable. No pancreatic ductal dilatation or surrounding inflammatory changes. Spleen: Normal in size without focal abnormality. Calcified granulomas are present. Adrenals/Urinary Tract: No urinary tract calculi or hydronephrosis. Bladder and adrenal glands are within normal limits. There is scarring in the inferior pole the right kidney. There are rounded hypodensities in the right kidney which are too small to characterize, likely cysts. These are unchanged. Stomach/Bowel: Moderate size hiatal hernia is again seen. Stomach is otherwise within normal limits. Appendix appears normal. No evidence of bowel wall  thickening, distention, or inflammatory changes. There is diffuse colonic diverticulosis. Vascular/Lymphatic: There is severe atherosclerotic calcifications of the aorta. Infrarenal abdominal aortic aneurysm measures 6.3 x 5.5 cm similar to the prior examination. IVC normal in size. No enlarged lymph nodes are identified. Reproductive: Prostate gland is enlarged. Other: No abdominal wall hernia or abnormality. No abdominopelvic ascites. Musculoskeletal: Degenerative changes affect the spine. IMPRESSION: 1. No acute localizing process in the abdomen or pelvis. 2. Stable 6.3 cm infrarenal abdominal aortic aneurysm. Recommend referral to a vascular specialist. 3. Colonic diverticulosis. 4. Stable moderate size hiatal hernia. 5. Aortic atherosclerosis. Aortic Atherosclerosis (ICD10-I70.0). Electronically Signed   By: Greig Pique M.D.   On: 09/23/2023 21:18  Procedures   Medications Ordered in the ED - No data to display                                  Medical Decision Making Amount and/or Complexity of Data Reviewed Labs: ordered. Radiology: ordered and independent interpretation performed. ECG/medicine tests: ordered and independent interpretation performed.  Risk Prescription drug management. Decision regarding hospitalization.   89 year old male presenting to the ED with abnormal outpatient labs.  Had a new creatinine of 3.9 on labs yesterday.  Seems his numbers have been creeping up over the past few months.  Son reports poor diet and oral intake.  He is afebrile and nontoxic in appearance here.  He is hypertensive, missed his nighttime dose of medications as he was in the waiting room.  I have ordered these now.  Labs confirm AKI with creatinine of 3.85 currently.  Has no leukocytosis, stable anemia.  UA without signs of infection.  CT was obtained in triage, no obstructive process.  Does have aortic aneurysm which is known and is being followed by vascular but has been deemed an  operative per son.  Will initiate IV fluids, given only small bolus and then will continue maintenance fluids as he does have advanced CHF with EF of 30 to 35%.  He will require admission.  Spoke with Dr. Franky-- will admit for ongoing care.  Final diagnoses:  AKI (acute kidney injury) [facility])    ED Discharge Orders     None          [name] Olam HERO, PA-C 09/24/23 0155    Haze Lonni PARAS, MD 09/24/23 0201

## 2023-09-24 NOTE — Assessment & Plan Note (Addendum)
 No signs of urinary retention  Continue tamsulosin 
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
## 2023-09-24 NOTE — Assessment & Plan Note (Signed)
 2024 echocardiogram with reduced LV systolic function with EF 30 to 35%, global hypokinesis, severe LVH, RV systolic function preserved, LA with severe dilatation, RA with mild dilatation, no significant valvular disease.   Patient was placed on oral hydralazine  for blood pressure control.  At the time of his discharge his renal function and blood pressure has improved.  Decision was made to start patient on amlodipine  and increase carvedilol  to 12.5 mg po bid  Continue to hold on entresto  for now.
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
## 2023-09-24 NOTE — Hospital Course (Signed)
 [name] [name] was admitted to the hospital with the working diagnosis of acute renal failure   89 yo male with the past medical history of heart failure, coronary artery disease, B cell lymphoma and abdominal aortic aneurysm who presented with worsening renal failure. Patient reported not eating well for a few weeks, he had a follow up with his primary care and was found to have worsening serum cr up to 3,9 from 2,3 two months prior. He was advice to come to the ED. On his initial physical examination his blood pressure was 209/91 and 141/100, HR 64, and 02 saturation 100%  Lungs with no rales or wheezing, heart with S1 and S2 present and regular with no gallops, rubs or murmurs, abdomen with no distention and no lower extremity edema.   Na 142, K 4.1 Cl 109, bicarbonate 18 glucose 118 bun 124 cr 3,85  AST 8 ALT 11  Wbc 6,9 hgb 12.1 plt 85  Urine analysis SG 1,014, negative protein, small hgb and negative leukocytes, 0-5 rbc, 0-5 wbc   Renal CT with no acute localizing process in the abdomen or pelvis  Stable 6,3 cm infrarenal abdominal aortic aneurysm.   EKG 85 bpm, left axis deviation, left bundle branch block, qtc 468, sinus rhythm with poor RR wave progression, J point elevation V1 to V4, T wave inversion V6. Positive LVH.   06/28 patient has been placed on IV fluids with improvement in renal function. Not yet back to baseline.  06/29 renal function and blood pressure continue to improve. Plan to follow up as outpatient.

## 2023-09-25 DIAGNOSIS — I2510 Atherosclerotic heart disease of native coronary artery without angina pectoris: Secondary | ICD-10-CM | POA: Diagnosis not present

## 2023-09-25 DIAGNOSIS — C8580 Other specified types of non-Hodgkin lymphoma, unspecified site: Secondary | ICD-10-CM

## 2023-09-25 DIAGNOSIS — N1832 Chronic kidney disease, stage 3b: Secondary | ICD-10-CM | POA: Diagnosis not present

## 2023-09-25 DIAGNOSIS — I5022 Chronic systolic (congestive) heart failure: Secondary | ICD-10-CM | POA: Diagnosis not present

## 2023-09-25 DIAGNOSIS — I10 Essential (primary) hypertension: Secondary | ICD-10-CM

## 2023-09-25 DIAGNOSIS — I7143 Infrarenal abdominal aortic aneurysm, without rupture: Secondary | ICD-10-CM

## 2023-09-25 LAB — BASIC METABOLIC PANEL WITH GFR
Anion gap: 12 (ref 5–15)
BUN: 98 mg/dL — ABNORMAL HIGH (ref 8–23)
CO2: 18 mmol/L — ABNORMAL LOW (ref 22–32)
Calcium: 7.8 mg/dL — ABNORMAL LOW (ref 8.9–10.3)
Chloride: 111 mmol/L (ref 98–111)
Creatinine, Ser: 2.48 mg/dL — ABNORMAL HIGH (ref 0.61–1.24)
GFR, Estimated: 25 mL/min — ABNORMAL LOW (ref >60)
Glucose, Bld: 104 mg/dL — ABNORMAL HIGH (ref 70–99)
Potassium: 4.1 mmol/L (ref 3.5–5.1)
Sodium: 141 mmol/L (ref 135–145)

## 2023-09-25 MED ORDER — ACETAMINOPHEN 325 MG PO TABS: 650.0000 mg | ORAL_TABLET | Freq: Four times a day (QID) | ORAL | Status: DC | PRN

## 2023-09-25 MED ORDER — SODIUM CHLORIDE 0.45 % IV SOLN: INTRAVENOUS | Status: DC

## 2023-09-25 MED ORDER — POLYETHYLENE GLYCOL 3350 17 G PO PACK: 17.0000 g | PACK | Freq: Every day | ORAL | Status: DC | PRN

## 2023-09-25 MED ORDER — PROCHLORPERAZINE EDISYLATE 10 MG/2ML IJ SOLN: 5.0000 mg | Freq: Four times a day (QID) | INTRAMUSCULAR | Status: DC | PRN

## 2023-09-25 MED ORDER — MELATONIN 5 MG PO TABS: 5.0000 mg | ORAL_TABLET | Freq: Every evening | ORAL | Status: DC | PRN

## 2023-09-25 NOTE — Evaluation (Signed)
 Occupational Therapy Evaluation Patient Details Name: [name] [name] MRN: [id number] DOB: 1935-03-21 Today's Date: 09/25/2023   History of Present Illness   Pt is a 89 y.o. M presenting to [facility] on 09/24/23 from follow-up apt w/ PCP when labs showed elevated creatinine levels; admitted for hypertensive urgency and acute on chronic kidney disease. PMYx:  CHF, HTN, CAD and PVD, STEMI x2, stroke x2,     Clinical Impressions Pt admitted based on above, and was seen based on problem list below. PTA pt was independent with ADLs. Today pt is requiring set up  to CGA for ADLs. Bed mobility was min HH assist and functional transfers are  CGA. Pt with would benefit from [facility] to promote safety within the home and reduce fall risks. OT will continue to follow acutely to maximize functional independence.     If plan is discharge home, recommend the following:   A little help with walking and/or transfers;A little help with bathing/dressing/bathroom;Direct supervision/assist for medications management     Functional Status Assessment   Patient has had a recent decline in their functional status and demonstrates the ability to make significant improvements in function in a reasonable and predictable amount of time.     Equipment Recommendations   None recommended by OT     Recommendations for Other Services         Precautions/Restrictions   Precautions Precautions: Fall Recall of Precautions/Restrictions: Intact Restrictions Weight Bearing Restrictions Per Provider Order: No     Mobility Bed Mobility Overal bed mobility: Needs Assistance Bed Mobility: Supine to Sit, Sit to Supine     Supine to sit: Min assist, HOB elevated, Used rails Sit to supine: Min assist, HOB elevated, Used rails   General bed mobility comments: Pt able to pull self to sitting w/ RUE and min A. VC given for sequencing; increased time to complete.    Transfers Overall transfer level: Needs  assistance Equipment used: Rolling walker (2 wheels) Transfers: Sit to/from Stand Sit to Stand: Min assist           General transfer comment: STS from EOB and bathroom w/ RW and min A for initial power up. VC given for anterior trunk lean. Pt braces bilateral LE on bed. Increased time and effort to complete.      Balance Overall balance assessment: Needs assistance Sitting-balance support: No upper extremity supported, Feet supported Sitting balance-Leahy Scale: Fair Sitting balance - Comments: seated EOB   Standing balance support: Bilateral upper extremity supported, During functional activity, Reliant on assistive device for balance Standing balance-Leahy Scale: Poor Standing balance comment: reliant on external support                           ADL either performed or assessed with clinical judgement   ADL Overall ADL's : Needs assistance/impaired Eating/Feeding: Set up;Sitting   Grooming: Wash/dry hands;Wash/dry face;Oral care;Contact guard assist;Standing Grooming Details (indicate cue type and reason): CGA to close supervision for balance         Upper Body Dressing : Set up;Sitting   Lower Body Dressing: Contact guard assist;Sit to/from stand Lower Body Dressing Details (indicate cue type and reason): Increased time and effort Toilet Transfer: Contact guard assist;Regular Toilet;Rolling walker (2 wheels);Ambulation;Grab bars Toilet Transfer Details (indicate cue type and reason): CGA for balance                 Vision Baseline Vision/History: 0 No visual deficits Vision Assessment?: No  apparent visual deficits            Pertinent Vitals/Pain Pain Assessment Pain Assessment: No/denies pain     Extremity/Trunk Assessment Upper Extremity Assessment Upper Extremity Assessment: Generalized weakness   Lower Extremity Assessment Lower Extremity Assessment: Defer to PT evaluation   Cervical / Trunk Assessment Cervical / Trunk Assessment:  Kyphotic   Communication Communication Communication: Impaired Factors Affecting Communication: Hearing impaired   Cognition Arousal: Alert Behavior During Therapy: WFL for tasks assessed/performed Cognition: No apparent impairments     OT - Cognition Comments: Poor STM and delayed processing, but overall WFL     Following commands: Intact       Cueing  General Comments   Cueing Techniques: Verbal cues;Visual cues  HR increased up 130s with mobility, IV was bleeding, RN notified           Home Living Family/patient expects to be discharged to:: Private residence Living Arrangements: Spouse/significant other Available Help at Discharge: Family;Available 24 hours/day (Daughter) Type of Home: House Home Access: Stairs to enter [employer] of Steps: 3 Entrance Stairs-Rails: Left Home Layout: One level     Bathroom Shower/Tub: Producer, television/film/video: Handicapped height Bathroom Accessibility: Yes How Accessible: Accessible via walker Home Equipment: Rolling Walker (2 wheels);Shower seat;Cane - single point          Prior Functioning/Environment Prior Level of Function : Independent/Modified Independent;History of Falls (last six months)     Mobility Comments: mod I utilizing RW for mobilization. Pt reports  approx 1 fall per month ADLs Comments: independent    OT Problem List: Decreased strength;Decreased range of motion;Decreased activity tolerance;Impaired balance (sitting and/or standing);Cardiopulmonary status limiting activity   OT Treatment/Interventions: Self-care/ADL training;Therapeutic exercise;Energy conservation;DME and/or AE instruction;Therapeutic activities;Patient/family education;Balance training      OT Goals(Current goals can be found in the care plan section)   Acute Rehab OT Goals Patient Stated Goal: To go home OT Goal Formulation: With patient Time For Goal Achievement: 10/09/23 Potential to Achieve Goals:  Good   OT Frequency:  Min 2X/week    Co-evaluation              AM-PAC OT 6 Clicks Daily Activity     Outcome Measure Help from another person eating meals?: None Help from another person taking care of personal grooming?: A Little Help from another person toileting, which includes using toliet, bedpan, or urinal?: A Little Help from another person bathing (including washing, rinsing, drying)?: A Little Help from another person to put on and taking off regular upper body clothing?: A Little Help from another person to put on and taking off regular lower body clothing?: A Little 6 Click Score: 19   End of Session Equipment Utilized During Treatment: Gait belt;Rolling walker (2 wheels) Nurse Communication: Mobility status;Other (comment) (IV bleeding)  Activity Tolerance: Patient tolerated treatment well Patient left: in bed;with nursing/sitter in room  OT Visit Diagnosis: Unsteadiness on feet (R26.81);Repeated falls (R29.6);Other abnormalities of gait and mobility (R26.89);Muscle weakness (generalized) (M62.81)                Time: 9247-9185 OT Time Calculation (min): 22 min Charges:  OT General Charges $OT Visit: 1 Visit OT Evaluation $OT Eval Moderate Complexity: 1 Mod  [name] [name], OT  Acute Rehabilitation Services Office [phone] Secure chat preferred   [name] [name] Savers 09/25/2023, 10:04 AM

## 2023-09-25 NOTE — Progress Notes (Signed)
 Progress Note   Patient: NOCHUM FENTER FMW:[id number] DOB: 1934/11/09 DOA: 09/23/2023     1 DOS: the patient was seen and examined on 09/25/2023   Brief hospital course: Mr. Mcgibbon was admitted to the hospital with the working diagnosis of acute renal failure   89 yo male with the past medical history of heart failure, coronary artery disease, B cell lymphoma and abdominal aortic aneurysm who presented with worsening renal failure. Patient reported not eating well for a few weeks, he had a follow up with his primary care and was found to have worsening serum cr up to 3,9 from 2,3 two months prior. He was advice to come to the ED. On his initial physical examination his blood pressure was 209/91 and 141/100, HR 64, and 02 saturation 100%  Lungs with no rales or wheezing, heart with S1 and S2 present and regular with no gallops, rubs or murmurs, abdomen with no distention and no lower extremity edema.   Na 142, K 4.1 Cl 109, bicarbonate 18 glucose 118 bun 124 cr 3,85  AST 8 ALT 11  Wbc 6,9 hgb 12.1 plt 85  Urine analysis SG 1,014, negative protein, small hgb and negative leukocytes, 0-5 rbc, 0-5 wbc   Renal CT with no acute localizing process in the abdomen or pelvis  Stable 6,3 cm infrarenal abdominal aortic aneurysm.   06/28 patient has been placed on IV fluids with improvement in renal function. Not yet back to baseline.    Assessment and Plan: * CKD stage 3b, GFR 30-44 ml/min (HCC) AKI   Improving renal function with serum cr at 2,48 with K at 4,1 and serum bicarbonate at 18  Na 141   Continue IV fluids with 0,45% saline at 50 ml per hr Diet with no restrictions Follow up renal function and electrolytes in am Avoid hypotension or nephrotoxic medications  Consult nutrition   Chronic systolic CHF (congestive heart failure) (HCC) 2024 echocardiogram with reduced LV systolic function with EF 30 to 35%, global hypokinesis, severe LVH, RV systolic function preserved, LA with  severe dilatation, RA with mild dilatation, no significant valvular disease.   Continue to hold on entresto  and diuretic therapy  Continue carvedilol  and afterload reduction with hydralazine    Essential hypertension Hypertensive emergency has resolved.  Continue blood pressure control with hydralazine  and carvedilol .  Hold on entresto  due to acutely reduced GFR.   CAD S/P percutaneous coronary angioplasty No chest pain, no acute coronary syndrome   Hyperlipidemia Continue statin therapy   AAA (abdominal aortic aneurysm) (HCC) Follow up as outpatient  Continue blood pressure control   BPH (benign prostatic hyperplasia) No signs of urinary retention  Continue tamsulosin    Large cell lymphoma (HCC) Follow up as outpatient      Subjective: Patient is feeling better, no chest pain and no dyspnea, no edema, PND or orthopnea   Physical Exam: Vitals:   09/25/23 0840 09/25/23 1132 09/25/23 1506 09/25/23 1510  BP: (P) 99/75 (!) 143/92 (!) 191/89 (!) 151/92  Pulse: (P) 94 76 74 72  Resp:  (!) 23 20 (!) 25  Temp: (P) 97.8 F (36.6 C) (!) 97.4 F (36.3 C) 97.8 F (36.6 C)   TempSrc: (P) Oral Oral Oral   SpO2: (P) 94% 99% 100% 99%  Weight:      Height:       Neurology awake and alert ENT with mild pallor Cardiovascular with S1 and S2 present and regular with no gallops, or rubs, no murmurs No JVD Respiratory  with no rales or wheezing, no rhonchi Abdomen with no distention  No lower extremity edema  Data Reviewed:    Family Communication: no family at the bedside.  I spoke with patient's daughter over the phone, we talked in detail about patient's condition, plan of care and prognosis and all questions were addressed.    Disposition: Status is: Inpatient Remains inpatient appropriate because: recovering renal failure   Planned Discharge Destination: Home    Author: Elidia Toribio Furnace, MD 09/25/2023 3:25 PM  For on call review [email or web address].

## 2023-09-25 NOTE — Plan of Care (Signed)
 Pt is A&OX3 but is impulsive and forgetful. Pt is very hard of hearing. VSS. Pt ambulates 1x assist with a walker. Pt has 1 PIV with IVF running (accidentally pulled out previous PIV on this shift). Adequate I/O. Primofit in place. BM noted today. No c/o pain. On RA. Family visited during shift. Pt has bed alarm on and call light in hand.   Problem: Education: Goal: Knowledge of General Education information will improve Description: Including pain rating scale, medication(s)/side effects and non-pharmacologic comfort measures Outcome: Progressing   Problem: Health Behavior/Discharge Planning: Goal: Ability to manage health-related needs will improve Outcome: Progressing   Problem: Clinical Measurements: Goal: Ability to maintain clinical measurements within normal limits will improve Outcome: Progressing Goal: Will remain free from infection Outcome: Progressing Goal: Diagnostic test results will improve Outcome: Progressing Goal: Respiratory complications will improve Outcome: Progressing Goal: Cardiovascular complication will be avoided Outcome: Progressing   Problem: Activity: Goal: Risk for activity intolerance will decrease Outcome: Progressing   Problem: Nutrition: Goal: Adequate nutrition will be maintained Outcome: Progressing   Problem: Coping: Goal: Level of anxiety will decrease Outcome: Progressing   Problem: Elimination: Goal: Will not experience complications related to bowel motility Outcome: Progressing Goal: Will not experience complications related to urinary retention Outcome: Progressing   Problem: Pain Managment: Goal: General experience of comfort will improve and/or be controlled Outcome: Progressing   Problem: Safety: Goal: Ability to remain free from injury will improve Outcome: Progressing   Problem: Skin Integrity: Goal: Risk for impaired skin integrity will decrease Outcome: Progressing

## 2023-09-26 DIAGNOSIS — I2510 Atherosclerotic heart disease of native coronary artery without angina pectoris: Secondary | ICD-10-CM | POA: Diagnosis not present

## 2023-09-26 DIAGNOSIS — I5022 Chronic systolic (congestive) heart failure: Secondary | ICD-10-CM | POA: Diagnosis not present

## 2023-09-26 DIAGNOSIS — N1832 Chronic kidney disease, stage 3b: Secondary | ICD-10-CM | POA: Diagnosis not present

## 2023-09-26 DIAGNOSIS — I10 Essential (primary) hypertension: Secondary | ICD-10-CM | POA: Diagnosis not present

## 2023-09-26 LAB — BASIC METABOLIC PANEL WITH GFR
Anion gap: 10 (ref 5–15)
BUN: 66 mg/dL — ABNORMAL HIGH (ref 8–23)
CO2: 17 mmol/L — ABNORMAL LOW (ref 22–32)
Calcium: 8.1 mg/dL — ABNORMAL LOW (ref 8.9–10.3)
Chloride: 116 mmol/L — ABNORMAL HIGH (ref 98–111)
Creatinine, Ser: 1.74 mg/dL — ABNORMAL HIGH (ref 0.61–1.24)
GFR, Estimated: 38 mL/min — ABNORMAL LOW (ref >60)
Glucose, Bld: 99 mg/dL (ref 70–99)
Potassium: 4 mmol/L (ref 3.5–5.1)
Sodium: 143 mmol/L (ref 135–145)

## 2023-09-26 MED ORDER — ENSURE PLUS HIGH PROTEIN PO LIQD: 237.0000 mL | Freq: Two times a day (BID) | ORAL | 0 refills | Status: AC

## 2023-09-26 MED ORDER — AMLODIPINE BESYLATE 10 MG PO TABS: 10.0000 mg | ORAL_TABLET | Freq: Every day | ORAL | 0 refills | Status: DC

## 2023-09-26 MED ORDER — CARVEDILOL 12.5 MG PO TABS: 12.5000 mg | ORAL_TABLET | Freq: Two times a day (BID) | ORAL | 0 refills | Status: DC

## 2023-09-26 MED ORDER — CARVEDILOL 12.5 MG PO TABS: 12.5000 mg | ORAL_TABLET | Freq: Two times a day (BID) | ORAL | Status: DC

## 2023-09-26 MED ORDER — HYDRALAZINE HCL 50 MG PO TABS: 50.0000 mg | ORAL_TABLET | Freq: Two times a day (BID) | ORAL | Status: DC

## 2023-09-26 MED ORDER — FUROSEMIDE 20 MG PO TABS: 20.0000 mg | ORAL_TABLET | Freq: Every day | ORAL | Status: DC | PRN

## 2023-09-26 MED ORDER — AMLODIPINE BESYLATE 10 MG PO TABS
10.0000 mg | ORAL_TABLET | Freq: Every day | ORAL | Status: DC
  Administered 2023-09-26: 10 mg via ORAL
  Filled 2023-09-26: qty 1

## 2023-09-26 NOTE — Progress Notes (Signed)
 Reviewed AVS, patient expressed understanding of medications, MD follow up reviewed.   Removed IV, Site clean, dry and intact.  Patient states all belongings brought to the hospital at time of admission are accounted for and packed to take home.  Patient and patients Dtr informed and expressed understanding where to pick up discharge medications. Pt transported to entrance A where family member was waiting in vehicle to transport home.

## 2023-09-26 NOTE — Discharge Summary (Addendum)
 Physician Discharge Summary   Patient: [name] [name] MRN: [id number] DOB: 08-24-1934  Admit date:     09/23/2023  Discharge date: 09/26/23  Discharge Physician: Elidia Sieving Ebubechukwu Jedlicka   PCP: Alphonsa Glendia LABOR, MD   Recommendations at discharge:   Entresto  has been discontinued and furosemide  change to take as needed in case of volume overload, weight increase 2 to 3 lbs in 24 or 5 lbs in 7 days To consider using ARB or ace inh once renal function more stable as outpatient.  Blood pressure control with amlodipine  and carvedilol  has been increased to 12.5 mg po bid Follow up renal function and electrolytes in 7 days Follow up with Dr Alphonsa in 7 to 10 days Home health arranged.   I spoke with patient's daughter over the phone , we talked in detail about patient's condition, plan of care and prognosis and all questions were addressed.   Discharge Diagnoses: Principal Problem:   CKD stage 3b, GFR 30-44 ml/min (HCC) Active Problems:   Chronic systolic CHF (congestive heart failure) (HCC)   Essential hypertension   CAD S/P percutaneous coronary angioplasty   Hyperlipidemia   AAA (abdominal aortic aneurysm) (HCC)   BPH (benign prostatic hyperplasia)   Large cell lymphoma (HCC)  Resolved Problems:   * No resolved hospital problems. [facility] Course: [name] [name] was admitted to the hospital with the working diagnosis of acute renal failure   89 yo male with the past medical history of heart failure, coronary artery disease, B cell lymphoma and abdominal aortic aneurysm who presented with worsening renal failure. Patient reported not eating well for a few weeks, he had a follow up with his primary care and was found to have worsening serum cr up to 3,9 from 2,3 two months prior. He was advice to come to the ED. On his initial physical examination his blood pressure was 209/91 and 141/100, HR 64, and 02 saturation 100%  Lungs with no rales or wheezing, heart with S1 and S2 present  and regular with no gallops, rubs or murmurs, abdomen with no distention and no lower extremity edema.   Na 142, K 4.1 Cl 109, bicarbonate 18 glucose 118 bun 124 cr 3,85  AST 8 ALT 11  Wbc 6,9 hgb 12.1 plt 85  Urine analysis SG 1,014, negative protein, small hgb and negative leukocytes, 0-5 rbc, 0-5 wbc   Renal CT with no acute localizing process in the abdomen or pelvis  Stable 6,3 cm infrarenal abdominal aortic aneurysm.   EKG 85 bpm, left axis deviation, left bundle branch block, qtc 468, sinus rhythm with poor RR wave progression, J point elevation V1 to V4, T wave inversion V6. Positive LVH.   06/28 patient has been placed on IV fluids with improvement in renal function. Not yet back to baseline.  06/29 renal function and blood pressure continue to improve. Plan to follow up as outpatient.    Assessment and Plan: * CKD stage 3b, GFR 30-44 ml/min (HCC) AKI   Entresto  and diuretic therapy were held. Patient was placed on IV fluids, isotonic and then hypotonic with improvement in renal function.  At the time of his discharge his serum cr is 1.74 with K at 4,0 and serum bicarbonate at 17  Na 143, Cl 116 anion gap 10   Patient will continue to hold entresto  for now, and will plan to follow up renal function as outpatient.  Furosemide  to take as needed for signs of volume overload.   Chronic systolic  CHF (congestive heart failure) (HCC) 2024 echocardiogram with reduced LV systolic function with EF 30 to 35%, global hypokinesis, severe LVH, RV systolic function preserved, LA with severe dilatation, RA with mild dilatation, no significant valvular disease.   Patient was placed on oral hydralazine  for blood pressure control.  At the time of his discharge his renal function and blood pressure has improved.  Decision was made to start patient on amlodipine  and increase carvedilol  to 12.5 mg po bid  Continue to hold on entresto  for now.   Essential hypertension Hypertensive emergency has  resolved.  During his hospitalization he received po hydralazine  50 mg tid.  Continue blood pressure control amlodipine  and carvedilol .  Hold on entresto  for now.    CAD S/P percutaneous coronary angioplasty No chest pain, no acute coronary syndrome   Hyperlipidemia Continue statin therapy   AAA (abdominal aortic aneurysm) (HCC) Follow up as outpatient  Continue blood pressure control   BPH (benign prostatic hyperplasia) No signs of urinary retention  Continue tamsulosin    Large cell lymphoma (HCC) Follow up as outpatient At the time of discharge thrombocytopenia stable at 70, with wbc at 6,0 and hgb at 11.3 (chronic anemia)        Consultants: none  Procedures performed: none   Disposition: Home Diet recommendation:  Cardiac diet DISCHARGE MEDICATION: Allergies as of 09/26/2023       Reactions   Brilinta  [ticagrelor ] Shortness Of Breath, Other (See Comments)   VERTIGO, also   Influenza Vaccines Other (See Comments)   Pt states he has been hospitalized both times he was given flu vaccine as a younger adult while in the [place] and they told him not to take it again   Fluoride Preparations Other (See Comments)   Reaction not recalled        Medication List     STOP taking these medications    sacubitril -valsartan  49-51 MG Commonly known as: ENTRESTO        TAKE these medications    acetaminophen  500 MG tablet Commonly known as: TYLENOL  Take 500 mg by mouth every 6 (six) hours as needed for mild pain (pain score 1-3) (or headaches).   amLODipine  10 MG tablet Commonly known as: NORVASC  Take 1 tablet (10 mg total) by mouth daily.   atorvastatin  20 MG tablet Commonly known as: LIPITOR TAKE 1 TABLET(20 MG) BY MOUTH DAILY   carvedilol  12.5 MG tablet Commonly known as: COREG  Take 1 tablet (12.5 mg total) by mouth 2 (two) times daily with a meal. What changed:  medication strength how much to take   clopidogrel  75 MG tablet Commonly known as:  PLAVIX  TAKE 1 TABLET BY MOUTH EVERY DAY   feeding supplement Liqd Take 237 mLs by mouth 2 (two) times daily between meals.   furosemide  20 MG tablet Commonly known as: LASIX  Take 1 tablet (20 mg total) by mouth daily as needed for fluid or edema (as needed in case of weight gain 2 to 3 lbs in 24 hrs or 5 lbs in 7 days.). What changed:  when to take this reasons to take this   methylcellulose 1 % ophthalmic solution Commonly known as: ARTIFICIAL TEARS Place 1 drop into both eyes 3 (three) times daily as needed (for dryness).   pantoprazole  20 MG tablet Commonly known as: PROTONIX  TAKE 1 TABLET BY MOUTH TWICE A DAY   Phenylephrine -Acetaminophen  5-325 MG Caps Take 1 capsule by mouth every 8 (eight) hours as needed (for allergic symptoms).   tamsulosin  0.4 MG Caps capsule  Commonly known as: FLOMAX  TAKE 1 CAPSULE EACH EVENING   traMADol  50 MG tablet Commonly known as: ULTRAM  TAKE 1 TABLET BY MOUTH EVERY 8 HOURS AS NEEDED What changed: reasons to take this        Discharge Exam: Filed Weights   09/24/23 1352 09/25/23 0457 09/26/23 0519  Weight: 62.1 kg 60.2 kg 61.8 kg   BP 126/77 (BP Location: Left Arm)   Pulse 96   Temp 97.6 F (36.4 C) (Oral)   Resp (!) 28   Ht 5' 8 (1.727 m)   Wt 61.8 kg   SpO2 99%   BMI 20.71 kg/m   Patient is feeling better, no chest pain and no dyspnea, no PND, lower extremity edema or orthopnea.  Neurology awake and alert ENT with mild pallor with no icterus Cardiovascular with S1 and S2 present and regular with no gallops, rubs or murmurs Respiratory with no rales or wheezing, no rhonchi  Abdomen with no distention, soft and non tender No lower extremity edema   Condition at discharge: stable  The results of significant diagnostics from this hospitalization (including imaging, microbiology, ancillary and laboratory) are listed below for reference.   Imaging Studies: CT Renal Stone Study Result Date: 09/23/2023 CLINICAL DATA:   Flank and abdominal pain. EXAM: CT ABDOMEN AND PELVIS WITHOUT CONTRAST TECHNIQUE: Multidetector CT imaging of the abdomen and pelvis was performed following the standard protocol without IV contrast. RADIATION DOSE REDUCTION: This exam was performed according to the departmental dose-optimization program which includes automated exposure control, adjustment of the mA and/or kV according to patient size and/or use of iterative reconstruction technique. COMPARISON:  CT chest abdomen and pelvis 06/09/2023. FINDINGS: Lower chest: There is some minimal peripheral reticular opacities in the lung bases. The heart is enlarged. Hepatobiliary: No focal liver abnormality is seen. No gallstones, gallbladder wall thickening, or biliary dilatation. Calcified granulomas are present. Pancreas: Unremarkable. No pancreatic ductal dilatation or surrounding inflammatory changes. Spleen: Normal in size without focal abnormality. Calcified granulomas are present. Adrenals/Urinary Tract: No urinary tract calculi or hydronephrosis. Bladder and adrenal glands are within normal limits. There is scarring in the inferior pole the right kidney. There are rounded hypodensities in the right kidney which are too small to characterize, likely cysts. These are unchanged. Stomach/Bowel: Moderate size hiatal hernia is again seen. Stomach is otherwise within normal limits. Appendix appears normal. No evidence of bowel wall thickening, distention, or inflammatory changes. There is diffuse colonic diverticulosis. Vascular/Lymphatic: There is severe atherosclerotic calcifications of the aorta. Infrarenal abdominal aortic aneurysm measures 6.3 x 5.5 cm similar to the prior examination. IVC normal in size. No enlarged lymph nodes are identified. Reproductive: Prostate gland is enlarged. Other: No abdominal wall hernia or abnormality. No abdominopelvic ascites. Musculoskeletal: Degenerative changes affect the spine. IMPRESSION: 1. No acute localizing process  in the abdomen or pelvis. 2. Stable 6.3 cm infrarenal abdominal aortic aneurysm. Recommend referral to a vascular specialist. 3. Colonic diverticulosis. 4. Stable moderate size hiatal hernia. 5. Aortic atherosclerosis. Aortic Atherosclerosis (ICD10-I70.0). Electronically Signed   By: Greig Pique M.D.   On: 09/23/2023 21:18    Microbiology: Results for orders placed or performed during the hospital encounter of 09/25/21  Blood Culture (routine x 2)     Status: Abnormal   Collection Time: 09/25/21  4:49 PM   Specimen: BLOOD  Result Value Ref Range Status   Specimen Description BLOOD RIGHT ANTECUBITAL  Final   Special Requests   Final    BOTTLES DRAWN AEROBIC AND ANAEROBIC  Blood Culture adequate volume   Culture  Setup Time   Final    GRAM NEGATIVE RODS AEROBIC BOTTLE ONLY CRITICAL RESULT CALLED TO, READ BACK BY AND VERIFIED WITH: PHARMD JAMES LEDFORD 09/26/21@23 :25  BY TW Performed at [facility], [address], [place], [place] [address]    Culture ESCHERICHIA COLI (A)  Final   Report Status 09/28/2021 FINAL  Final   Organism ID, Bacteria ESCHERICHIA COLI  Final      Susceptibility   Escherichia coli - MIC*    AMPICILLIN <=2 SENSITIVE Sensitive     CEFAZOLIN <=4 SENSITIVE Sensitive     CEFEPIME <=0.12 SENSITIVE Sensitive     CEFTAZIDIME <=1 SENSITIVE Sensitive     CEFTRIAXONE  <=0.25 SENSITIVE Sensitive     CIPROFLOXACIN  <=0.25 SENSITIVE Sensitive     GENTAMICIN <=1 SENSITIVE Sensitive     IMIPENEM <=0.25 SENSITIVE Sensitive     TRIMETH/SULFA <=20 SENSITIVE Sensitive     AMPICILLIN/SULBACTAM <=2 SENSITIVE Sensitive     PIP/TAZO <=4 SENSITIVE Sensitive     * ESCHERICHIA COLI  Blood Culture ID Panel (Reflexed)     Status: Abnormal   Collection Time: 09/25/21  4:49 PM  Result Value Ref Range Status   Enterococcus faecalis NOT DETECTED NOT DETECTED Final   Enterococcus Faecium NOT DETECTED NOT DETECTED Final   Listeria monocytogenes NOT DETECTED NOT DETECTED Final    Staphylococcus species NOT DETECTED NOT DETECTED Final   Staphylococcus aureus (BCID) NOT DETECTED NOT DETECTED Final   Staphylococcus epidermidis NOT DETECTED NOT DETECTED Final   Staphylococcus lugdunensis NOT DETECTED NOT DETECTED Final   Streptococcus species NOT DETECTED NOT DETECTED Final   Streptococcus agalactiae NOT DETECTED NOT DETECTED Final   Streptococcus pneumoniae NOT DETECTED NOT DETECTED Final   Streptococcus pyogenes NOT DETECTED NOT DETECTED Final   A.calcoaceticus-baumannii NOT DETECTED NOT DETECTED Final   Bacteroides fragilis NOT DETECTED NOT DETECTED Final   Enterobacterales DETECTED (A) NOT DETECTED Final    Comment: Enterobacterales represent a large order of gram negative bacteria, not a single organism. CRITICAL RESULT CALLED TO, READ BACK BY AND VERIFIED WITH: PHARMD JAMES LEDFORD 09/26/21@23 :25  BY TW    Enterobacter cloacae complex NOT DETECTED NOT DETECTED Final   Escherichia coli DETECTED (A) NOT DETECTED Final    Comment: CRITICAL RESULT CALLED TO, READ BACK BY AND VERIFIED WITH: PHARMD JAMES LEDFORD 09/26/21@23 :25  BY TW    Klebsiella aerogenes NOT DETECTED NOT DETECTED Final   Klebsiella oxytoca NOT DETECTED NOT DETECTED Final   Klebsiella pneumoniae NOT DETECTED NOT DETECTED Final   Proteus species NOT DETECTED NOT DETECTED Final   Salmonella species NOT DETECTED NOT DETECTED Final   Serratia marcescens NOT DETECTED NOT DETECTED Final   Haemophilus influenzae NOT DETECTED NOT DETECTED Final   Neisseria meningitidis NOT DETECTED NOT DETECTED Final   Pseudomonas aeruginosa NOT DETECTED NOT DETECTED Final   Stenotrophomonas maltophilia NOT DETECTED NOT DETECTED Final   Candida albicans NOT DETECTED NOT DETECTED Final   Candida auris NOT DETECTED NOT DETECTED Final   Candida glabrata NOT DETECTED NOT DETECTED Final   Candida krusei NOT DETECTED NOT DETECTED Final   Candida parapsilosis NOT DETECTED NOT DETECTED Final   Candida tropicalis NOT DETECTED  NOT DETECTED Final   Cryptococcus neoformans/gattii NOT DETECTED NOT DETECTED Final   CTX-M ESBL NOT DETECTED NOT DETECTED Final   Carbapenem resistance IMP NOT DETECTED NOT DETECTED Final   Carbapenem resistance KPC NOT DETECTED NOT DETECTED Final   Carbapenem resistance NDM  NOT DETECTED NOT DETECTED Final   Carbapenem resist OXA 48 LIKE NOT DETECTED NOT DETECTED Final   Carbapenem resistance VIM NOT DETECTED NOT DETECTED Final    Comment: Performed at [facility], [address], [place], [place] [address]  SARS Coronavirus 2 by RT PCR (hospital order, performed in [facility] hospital lab) *cepheid single result test* Anterior Nasal Swab     Status: None   Collection Time: 09/25/21  4:53 PM   Specimen: Anterior Nasal Swab  Result Value Ref Range Status   SARS Coronavirus 2 by RT PCR NEGATIVE NEGATIVE Final    Comment: (NOTE) SARS-CoV-2 target nucleic acids are NOT DETECTED.  The SARS-CoV-2 RNA is generally detectable in upper and lower respiratory specimens during the acute phase of infection. The lowest concentration of SARS-CoV-2 viral copies this assay can detect is 250 copies / mL. A negative result does not preclude SARS-CoV-2 infection and should not be used as the sole basis for treatment or other patient management decisions.  A negative result may occur with improper specimen collection / handling, submission of specimen other than nasopharyngeal swab, presence of viral mutation(s) within the areas targeted by this assay, and inadequate number of viral copies (<250 copies / mL). A negative result must be combined with clinical observations, patient history, and epidemiological information.  Fact Sheet for Patients:   RoadLapTop.co.za  Fact Sheet for Healthcare Providers: http://kim-miller.com/  This test is not yet approved or  cleared by the [place]  FDA and has been authorized for detection and/or diagnosis of  SARS-CoV-2 by FDA under an Emergency Use Authorization (EUA).  This EUA will remain in effect (meaning this test can be used) for the duration of the COVID-19 declaration under Section 564(b)(1) of the Act, 21 U.S.C. section 360bbb-3(b)(1), unless the authorization is terminated or revoked sooner.  Performed at [facility], [address] [place], [place] [address]   Urine Culture     Status: Abnormal   Collection Time: 09/25/21  9:25 PM   Specimen: In/Out Cath Urine  Result Value Ref Range Status   Specimen Description IN/OUT CATH URINE  Final   Special Requests   Final    NONE Performed at [facility], [address], [place], [place] [address]    Culture >=100,000 COLONIES/mL ESCHERICHIA COLI (A)  Final   Report Status 09/28/2021 FINAL  Final   Organism ID, Bacteria ESCHERICHIA COLI (A)  Final      Susceptibility   Escherichia coli - MIC*    AMPICILLIN <=2 SENSITIVE Sensitive     CEFAZOLIN <=4 SENSITIVE Sensitive     CEFEPIME <=0.12 SENSITIVE Sensitive     CEFTRIAXONE  <=0.25 SENSITIVE Sensitive     CIPROFLOXACIN  <=0.25 SENSITIVE Sensitive     GENTAMICIN <=1 SENSITIVE Sensitive     IMIPENEM <=0.25 SENSITIVE Sensitive     NITROFURANTOIN <=16 SENSITIVE Sensitive     TRIMETH/SULFA <=20 SENSITIVE Sensitive     AMPICILLIN/SULBACTAM <=2 SENSITIVE Sensitive     PIP/TAZO <=4 SENSITIVE Sensitive     * >=100,000 COLONIES/mL ESCHERICHIA COLI    Labs: CBC: Recent Labs  Lab 09/22/23 1447 09/23/23 2017 09/24/23 0447  WBC 6.9 6.9 6.0  NEUTROABS 5.4  --   --   HGB 12.1* 12.1* 11.3*  HCT 38.2 37.7* 35.7*  MCV 92 89.1 93.9  PLT 87* 85* 70*   Basic Metabolic Panel: Recent Labs  Lab 09/22/23 1446 09/23/23 2017 09/24/23 0447 09/25/23 0242 09/26/23 0339  NA 142  142 141 141 143  K 4.6 4.1 3.7 4.1 4.0  CL 106 109 112* 111 116*  CO2 15* 18* 14* 18* 17*  GLUCOSE 115* 128* 92 104* 99  BUN 112* 124* 112* 98* 66*  CREATININE 3.99* 3.85* 3.11* 2.48* 1.74*   CALCIUM  7.8* 7.8* 7.5* 7.8* 8.1*   Liver Function Tests: Recent Labs  Lab 09/22/23 1447 09/23/23 2017 09/24/23 0447  AST 7 8* 8*  ALT 8 11 9   ALKPHOS 76 57 48  BILITOT 0.3 0.4 0.5  PROT 6.1 6.1* 5.3*  ALBUMIN 3.8 3.2* 2.8*   CBG: No results for input(s): GLUCAP in the last 168 hours.  Discharge time spent: greater than 30 minutes.  Signed: Elidia Toribio Furnace, MD [facility] 09/26/2023

## 2023-09-26 NOTE — Plan of Care (Signed)
  Problem: Clinical Measurements: Goal: Ability to maintain clinical measurements within normal limits will improve Outcome: Progressing Goal: Will remain free from infection Outcome: Progressing Goal: Diagnostic test results will improve Outcome: Progressing Goal: Respiratory complications will improve Outcome: Progressing Goal: Cardiovascular complication will be avoided Outcome: Progressing   Problem: Nutrition: Goal: Adequate nutrition will be maintained Outcome: Progressing   Problem: Coping: Goal: Level of anxiety will decrease Outcome: Progressing   Problem: Elimination: Goal: Will not experience complications related to bowel motility Outcome: Progressing Goal: Will not experience complications related to urinary retention Outcome: Progressing   Problem: Pain Managment: Goal: General experience of comfort will improve and/or be controlled Outcome: Progressing   Problem: Safety: Goal: Ability to remain free from injury will improve Outcome: Progressing   Problem: Skin Integrity: Goal: Risk for impaired skin integrity will decrease Outcome: Progressing   Problem: Education: Goal: Knowledge of General Education information will improve Description: Including pain rating scale, medication(s)/side effects and non-pharmacologic comfort measures Outcome: Not Progressing   Problem: Health Behavior/Discharge Planning: Goal: Ability to manage health-related needs will improve Outcome: Not Progressing   Problem: Activity: Goal: Risk for activity intolerance will decrease Outcome: Not Progressing

## 2023-09-26 NOTE — TOC Transition Note (Signed)
 Transition of Care [facility]) - Discharge Note   Patient Details  Name: [name] [name] MRN: [id number] Date of Birth: 1934-09-21  [facility] of [facility] CM/SW Contact:  Marval Gell, RN Phone Number: 09/26/2023, 9:44 AM   Clinical Narrative:     Beatris w patient's daughter [name] over the phone to discuss DC needs.  [name] agreeable to [facility] services, reviewed Medicare ratings and she would like [place].  Referral accepted  They have needed DME at home.  Family to provide transportation home      Barriers to Discharge: No Barriers Identified   Patient Goals and CMS Choice Patient states their goals for this hospitalization and ongoing recovery are:: to go home CMS Medicare.gov Compare Post Acute Care list provided to:: Other (Comment Required) Choice offered to / list presented to : Adult Children      Discharge Placement                       Discharge Plan and Services Additional resources added to the After Visit Summary for     Discharge Planning Services: CM Consult Post Acute Care Choice: Home Health          DME Arranged: N/A         HH Arranged: PT, OT HH Agency: [facility] Date [facility] Agency Contacted: 09/26/23 Time HH Agency Contacted: [phone] Representative spoke with at [facility] Agency: Cindie  Social Drivers of Health (SDOH) Interventions SDOH Screenings   Food Insecurity: No Food Insecurity (09/24/2023)  Housing: High Risk (09/24/2023)  Transportation Needs: No Transportation Needs (09/24/2023)  Utilities: Not At Risk (09/24/2023)  Alcohol  Screen: Low Risk  (01/07/2022)  Depression (PHQ2-9): High Risk (09/22/2023)  Financial Resource Strain: Low Risk  (01/07/2022)  Physical Activity: Insufficiently Active (01/07/2022)  Social Connections: Moderately Isolated (09/24/2023)  Stress: No Stress Concern Present (01/07/2022)  Tobacco Use: Medium Risk (09/24/2023)     Readmission Risk Interventions    09/29/2021   11:16 AM  Readmission Risk  Prevention Plan  Transportation Screening Complete  PCP or Specialist Appt within 5-7 Days Complete  Home Care Screening Complete  Medication Review (RN CM) Complete

## 2023-09-29 ENCOUNTER — Encounter: Payer: Self-pay | Admitting: Family Medicine

## 2023-09-29 ENCOUNTER — Ambulatory Visit (INDEPENDENT_AMBULATORY_CARE_PROVIDER_SITE_OTHER): Admitting: Family Medicine

## 2023-09-29 VITALS — BP 158/80 | HR 75 | Temp 98.4°F | Ht 68.0 in | Wt 145.0 lb

## 2023-09-29 DIAGNOSIS — R600 Localized edema: Secondary | ICD-10-CM | POA: Diagnosis not present

## 2023-09-29 DIAGNOSIS — N183 Chronic kidney disease, stage 3 unspecified: Secondary | ICD-10-CM | POA: Diagnosis not present

## 2023-09-29 DIAGNOSIS — I5042 Chronic combined systolic (congestive) and diastolic (congestive) heart failure: Secondary | ICD-10-CM

## 2023-09-29 DIAGNOSIS — I10 Essential (primary) hypertension: Secondary | ICD-10-CM

## 2023-09-29 NOTE — Progress Notes (Signed)
   Subjective:    Patient ID: [name] [name], male    DOB: 1935-02-23, 89 y.o.   MRN: [id number]  HPI Hospital follow up AKI Discussed the use of AI scribe software for clinical note transcription with the patient, who gave verbal consent to proceed.  History of Present Illness   [name] [name] is an 89 year old male with congestive heart failure who presents for medication management and monitoring of kidney function. He is accompanied by his caregiver, who assists with his medication management.  He has a history of congestive heart failure and is currently experiencing issues with fluid retention. He has gained weight but notes no visible fluid accumulation. He is unable to weigh himself regularly due to steadiness issues, and a nurse will visit weekly to assist with monitoring.  His medication regimen has been adjusted due to concerns about kidney function. He was previously on a diuretic, which has been stopped unless needed. He is currently not taking Entresto  due to its impact on kidney function. His caregiver reports that he was previously taking his vasodilator medication incorrectly, but this has been corrected.  His appetite has improved, and he is now eating more and drinking a bottle of water daily. He has been without tramadol  for a while but is considering restarting it for pain management.  His caregiver mentions that he has gained ten pounds since leaving the hospital, where he was not eating much. He disliked the hospital food, particularly the low sodium meals, and is now eating better at home.  No current issues with fluid retention, such as swelling or significant weight gain.       Review of Systems     Objective:   Physical Exam General-in no acute distress Eyes-no discharge Lungs-respiratory rate normal, CTA CV-no murmurs,RRR Extremities skin warm dry noted on the ankles edema Neuro grossly normal Behavior normal, alert        Assessment &  Plan:  1. Stage 3 chronic kidney disease, unspecified whether stage 3a or 3b CKD (HCC) (Primary) Check lab work May need to reinitiate a ARB.  Possibly valsartan  - Basic Metabolic Panel  2. Primary hypertension Decent control for age  56. Chronic combined systolic and diastolic congestive heart failure (HCC) Due to a acute renal insufficiency Entresto  was stopped we are recommending ARB at this point  4. Pedal edema Intermittent use of diuretics not regularly use  Follow-up as planned end of October we will need to change the date to see schedule  Await lab work Will communicate with cardiology as well No overt congestive heart failure currently  \

## 2023-09-30 ENCOUNTER — Other Ambulatory Visit: Payer: Self-pay

## 2023-09-30 ENCOUNTER — Encounter: Payer: Self-pay | Admitting: Family Medicine

## 2023-09-30 ENCOUNTER — Ambulatory Visit: Payer: Self-pay | Admitting: Family Medicine

## 2023-09-30 DIAGNOSIS — I700 Atherosclerosis of aorta: Secondary | ICD-10-CM | POA: Diagnosis not present

## 2023-09-30 DIAGNOSIS — I5023 Acute on chronic systolic (congestive) heart failure: Secondary | ICD-10-CM | POA: Diagnosis not present

## 2023-09-30 DIAGNOSIS — N183 Chronic kidney disease, stage 3 unspecified: Secondary | ICD-10-CM

## 2023-09-30 DIAGNOSIS — N1832 Chronic kidney disease, stage 3b: Secondary | ICD-10-CM | POA: Diagnosis not present

## 2023-09-30 DIAGNOSIS — I2510 Atherosclerotic heart disease of native coronary artery without angina pectoris: Secondary | ICD-10-CM | POA: Diagnosis not present

## 2023-09-30 DIAGNOSIS — D631 Anemia in chronic kidney disease: Secondary | ICD-10-CM | POA: Diagnosis not present

## 2023-09-30 DIAGNOSIS — R351 Nocturia: Secondary | ICD-10-CM | POA: Diagnosis not present

## 2023-09-30 DIAGNOSIS — K449 Diaphragmatic hernia without obstruction or gangrene: Secondary | ICD-10-CM | POA: Diagnosis not present

## 2023-09-30 DIAGNOSIS — J9601 Acute respiratory failure with hypoxia: Secondary | ICD-10-CM | POA: Diagnosis not present

## 2023-09-30 DIAGNOSIS — R350 Frequency of micturition: Secondary | ICD-10-CM | POA: Diagnosis not present

## 2023-09-30 DIAGNOSIS — I130 Hypertensive heart and chronic kidney disease with heart failure and stage 1 through stage 4 chronic kidney disease, or unspecified chronic kidney disease: Secondary | ICD-10-CM | POA: Diagnosis not present

## 2023-09-30 DIAGNOSIS — C851A Unspecified b-cell lymphoma, in remission: Secondary | ICD-10-CM | POA: Diagnosis not present

## 2023-09-30 DIAGNOSIS — N179 Acute kidney failure, unspecified: Secondary | ICD-10-CM | POA: Diagnosis not present

## 2023-09-30 DIAGNOSIS — M150 Primary generalized (osteo)arthritis: Secondary | ICD-10-CM | POA: Diagnosis not present

## 2023-09-30 DIAGNOSIS — I7143 Infrarenal abdominal aortic aneurysm, without rupture: Secondary | ICD-10-CM | POA: Diagnosis not present

## 2023-09-30 DIAGNOSIS — I447 Left bundle-branch block, unspecified: Secondary | ICD-10-CM | POA: Diagnosis not present

## 2023-09-30 DIAGNOSIS — K5750 Diverticulosis of both small and large intestine without perforation or abscess without bleeding: Secondary | ICD-10-CM | POA: Diagnosis not present

## 2023-09-30 DIAGNOSIS — I252 Old myocardial infarction: Secondary | ICD-10-CM | POA: Diagnosis not present

## 2023-09-30 DIAGNOSIS — N401 Enlarged prostate with lower urinary tract symptoms: Secondary | ICD-10-CM | POA: Diagnosis not present

## 2023-09-30 DIAGNOSIS — I255 Ischemic cardiomyopathy: Secondary | ICD-10-CM | POA: Diagnosis not present

## 2023-09-30 DIAGNOSIS — K219 Gastro-esophageal reflux disease without esophagitis: Secondary | ICD-10-CM | POA: Diagnosis not present

## 2023-09-30 DIAGNOSIS — D630 Anemia in neoplastic disease: Secondary | ICD-10-CM | POA: Diagnosis not present

## 2023-09-30 DIAGNOSIS — I160 Hypertensive urgency: Secondary | ICD-10-CM | POA: Diagnosis not present

## 2023-09-30 DIAGNOSIS — I69351 Hemiplegia and hemiparesis following cerebral infarction affecting right dominant side: Secondary | ICD-10-CM | POA: Diagnosis not present

## 2023-09-30 DIAGNOSIS — I70202 Unspecified atherosclerosis of native arteries of extremities, left leg: Secondary | ICD-10-CM | POA: Diagnosis not present

## 2023-09-30 LAB — BASIC METABOLIC PANEL WITH GFR
BUN/Creatinine Ratio: 29 — ABNORMAL HIGH (ref 10–24)
BUN: 43 mg/dL — ABNORMAL HIGH (ref 8–27)
CO2: 19 mmol/L — ABNORMAL LOW (ref 20–29)
Calcium: 7.9 mg/dL — ABNORMAL LOW (ref 8.6–10.2)
Chloride: 110 mmol/L — ABNORMAL HIGH (ref 96–106)
Creatinine, Ser: 1.49 mg/dL — ABNORMAL HIGH (ref 0.76–1.27)
Glucose: 98 mg/dL (ref 70–99)
Potassium: 5.5 mmol/L — ABNORMAL HIGH (ref 3.5–5.2)
Sodium: 144 mmol/L (ref 134–144)
eGFR: 45 mL/min/{1.73_m2} — ABNORMAL LOW (ref >59)

## 2023-10-02 ENCOUNTER — Encounter (HOSPITAL_COMMUNITY): Payer: Self-pay | Admitting: Emergency Medicine

## 2023-10-02 ENCOUNTER — Inpatient Hospital Stay (HOSPITAL_COMMUNITY)
Admission: EM | Admit: 2023-10-02 | Discharge: 2023-10-05 | DRG: 291 | Disposition: A | Discharge status: Home Health | Attending: Internal Medicine | Admitting: Internal Medicine

## 2023-10-02 ENCOUNTER — Emergency Department (HOSPITAL_COMMUNITY)

## 2023-10-02 ENCOUNTER — Other Ambulatory Visit: Payer: Self-pay

## 2023-10-02 DIAGNOSIS — K219 Gastro-esophageal reflux disease without esophagitis: Secondary | ICD-10-CM | POA: Diagnosis present

## 2023-10-02 DIAGNOSIS — I509 Heart failure, unspecified: Secondary | ICD-10-CM | POA: Diagnosis not present

## 2023-10-02 DIAGNOSIS — I160 Hypertensive urgency: Secondary | ICD-10-CM | POA: Diagnosis present

## 2023-10-02 DIAGNOSIS — R296 Repeated falls: Secondary | ICD-10-CM | POA: Diagnosis present

## 2023-10-02 DIAGNOSIS — Z8572 Personal history of non-Hodgkin lymphomas: Secondary | ICD-10-CM

## 2023-10-02 DIAGNOSIS — I700 Atherosclerosis of aorta: Secondary | ICD-10-CM | POA: Diagnosis not present

## 2023-10-02 DIAGNOSIS — I110 Hypertensive heart disease with heart failure: Secondary | ICD-10-CM | POA: Diagnosis not present

## 2023-10-02 DIAGNOSIS — J9601 Acute respiratory failure with hypoxia: Secondary | ICD-10-CM | POA: Diagnosis not present

## 2023-10-02 DIAGNOSIS — Z9841 Cataract extraction status, right eye: Secondary | ICD-10-CM

## 2023-10-02 DIAGNOSIS — R0689 Other abnormalities of breathing: Secondary | ICD-10-CM | POA: Diagnosis not present

## 2023-10-02 DIAGNOSIS — D631 Anemia in chronic kidney disease: Secondary | ICD-10-CM | POA: Diagnosis present

## 2023-10-02 DIAGNOSIS — Z888 Allergy status to other drugs, medicaments and biological substances status: Secondary | ICD-10-CM

## 2023-10-02 DIAGNOSIS — R0602 Shortness of breath: Secondary | ICD-10-CM | POA: Diagnosis not present

## 2023-10-02 DIAGNOSIS — R220 Localized swelling, mass and lump, head: Secondary | ICD-10-CM | POA: Diagnosis not present

## 2023-10-02 DIAGNOSIS — E785 Hyperlipidemia, unspecified: Secondary | ICD-10-CM | POA: Diagnosis present

## 2023-10-02 DIAGNOSIS — Z8673 Personal history of transient ischemic attack (TIA), and cerebral infarction without residual deficits: Secondary | ICD-10-CM

## 2023-10-02 DIAGNOSIS — I5022 Chronic systolic (congestive) heart failure: Secondary | ICD-10-CM | POA: Diagnosis not present

## 2023-10-02 DIAGNOSIS — Z955 Presence of coronary angioplasty implant and graft: Secondary | ICD-10-CM

## 2023-10-02 DIAGNOSIS — Z8249 Family history of ischemic heart disease and other diseases of the circulatory system: Secondary | ICD-10-CM

## 2023-10-02 DIAGNOSIS — R062 Wheezing: Secondary | ICD-10-CM | POA: Diagnosis not present

## 2023-10-02 DIAGNOSIS — I10 Essential (primary) hypertension: Secondary | ICD-10-CM | POA: Diagnosis not present

## 2023-10-02 DIAGNOSIS — Z887 Allergy status to serum and vaccine status: Secondary | ICD-10-CM

## 2023-10-02 DIAGNOSIS — Z8619 Personal history of other infectious and parasitic diseases: Secondary | ICD-10-CM

## 2023-10-02 DIAGNOSIS — D696 Thrombocytopenia, unspecified: Secondary | ICD-10-CM | POA: Diagnosis present

## 2023-10-02 DIAGNOSIS — R0902 Hypoxemia: Secondary | ICD-10-CM | POA: Diagnosis not present

## 2023-10-02 DIAGNOSIS — I252 Old myocardial infarction: Secondary | ICD-10-CM

## 2023-10-02 DIAGNOSIS — Z83438 Family history of other disorder of lipoprotein metabolism and other lipidemia: Secondary | ICD-10-CM

## 2023-10-02 DIAGNOSIS — J811 Chronic pulmonary edema: Secondary | ICD-10-CM | POA: Diagnosis not present

## 2023-10-02 DIAGNOSIS — Z833 Family history of diabetes mellitus: Secondary | ICD-10-CM

## 2023-10-02 DIAGNOSIS — I130 Hypertensive heart and chronic kidney disease with heart failure and stage 1 through stage 4 chronic kidney disease, or unspecified chronic kidney disease: Secondary | ICD-10-CM | POA: Diagnosis not present

## 2023-10-02 DIAGNOSIS — W1830XA Fall on same level, unspecified, initial encounter: Secondary | ICD-10-CM | POA: Insufficient documentation

## 2023-10-02 DIAGNOSIS — I5043 Acute on chronic combined systolic (congestive) and diastolic (congestive) heart failure: Secondary | ICD-10-CM | POA: Diagnosis not present

## 2023-10-02 DIAGNOSIS — Z1152 Encounter for screening for COVID-19: Secondary | ICD-10-CM

## 2023-10-02 DIAGNOSIS — G8929 Other chronic pain: Secondary | ICD-10-CM | POA: Diagnosis present

## 2023-10-02 DIAGNOSIS — N400 Enlarged prostate without lower urinary tract symptoms: Secondary | ICD-10-CM | POA: Diagnosis present

## 2023-10-02 DIAGNOSIS — Z79899 Other long term (current) drug therapy: Secondary | ICD-10-CM

## 2023-10-02 DIAGNOSIS — Z87891 Personal history of nicotine dependence: Secondary | ICD-10-CM

## 2023-10-02 DIAGNOSIS — I7142 Juxtarenal abdominal aortic aneurysm, without rupture: Secondary | ICD-10-CM | POA: Diagnosis present

## 2023-10-02 DIAGNOSIS — Z961 Presence of intraocular lens: Secondary | ICD-10-CM | POA: Diagnosis present

## 2023-10-02 DIAGNOSIS — I2510 Atherosclerotic heart disease of native coronary artery without angina pectoris: Secondary | ICD-10-CM | POA: Diagnosis present

## 2023-10-02 DIAGNOSIS — I5023 Acute on chronic systolic (congestive) heart failure: Secondary | ICD-10-CM | POA: Diagnosis present

## 2023-10-02 DIAGNOSIS — E875 Hyperkalemia: Secondary | ICD-10-CM | POA: Diagnosis present

## 2023-10-02 DIAGNOSIS — N1831 Chronic kidney disease, stage 3a: Secondary | ICD-10-CM | POA: Diagnosis present

## 2023-10-02 DIAGNOSIS — I739 Peripheral vascular disease, unspecified: Secondary | ICD-10-CM | POA: Diagnosis present

## 2023-10-02 DIAGNOSIS — Z9842 Cataract extraction status, left eye: Secondary | ICD-10-CM

## 2023-10-02 DIAGNOSIS — Z9221 Personal history of antineoplastic chemotherapy: Secondary | ICD-10-CM

## 2023-10-02 DIAGNOSIS — Z7902 Long term (current) use of antithrombotics/antiplatelets: Secondary | ICD-10-CM

## 2023-10-02 LAB — CBC WITH DIFFERENTIAL/PLATELET
Abs Immature Granulocytes: 0.06 K/uL (ref 0.00–0.07)
Basophils Absolute: 0 K/uL (ref 0.0–0.1)
Basophils Relative: 0 %
Eosinophils Absolute: 0.1 K/uL (ref 0.0–0.5)
Eosinophils Relative: 1 %
HCT: 32.6 % — ABNORMAL LOW (ref 39.0–52.0)
Hemoglobin: 10.6 g/dL — ABNORMAL LOW (ref 13.0–17.0)
Immature Granulocytes: 1 %
Lymphocytes Relative: 8 %
Lymphs Abs: 0.8 K/uL (ref 0.7–4.0)
MCH: 29.8 pg (ref 26.0–34.0)
MCHC: 32.5 g/dL (ref 30.0–36.0)
MCV: 91.6 fL (ref 80.0–100.0)
Monocytes Absolute: 0.7 K/uL (ref 0.1–1.0)
Monocytes Relative: 7 %
Neutro Abs: 8 K/uL — ABNORMAL HIGH (ref 1.7–7.7)
Neutrophils Relative %: 83 %
Platelets: 108 K/uL — ABNORMAL LOW (ref 150–400)
RBC: 3.56 MIL/uL — ABNORMAL LOW (ref 4.22–5.81)
RDW: 15.1 % (ref 11.5–15.5)
WBC: 9.6 K/uL (ref 4.0–10.5)
nRBC: 0 % (ref 0.0–0.2)

## 2023-10-02 LAB — RESP PANEL BY RT-PCR (RSV, FLU A&B, COVID)  RVPGX2
Influenza A by PCR: NEGATIVE
Influenza B by PCR: NEGATIVE
Resp Syncytial Virus by PCR: NEGATIVE
SARS Coronavirus 2 by RT PCR: NEGATIVE

## 2023-10-02 LAB — I-STAT VENOUS BLOOD GAS, ED
Acid-Base Excess: 0 mmol/L (ref 0.0–2.0)
Bicarbonate: 24 mmol/L (ref 20.0–28.0)
Calcium, Ion: 1.03 mmol/L — ABNORMAL LOW (ref 1.15–1.40)
HCT: 31 % — ABNORMAL LOW (ref 39.0–52.0)
Hemoglobin: 10.5 g/dL — ABNORMAL LOW (ref 13.0–17.0)
O2 Saturation: 94 %
Potassium: 6.2 mmol/L — ABNORMAL HIGH (ref 3.5–5.1)
Sodium: 135 mmol/L (ref 135–145)
TCO2: 25 mmol/L (ref 22–32)
pCO2, Ven: 37.4 mmHg — ABNORMAL LOW (ref 44–60)
pH, Ven: 7.415 (ref 7.25–7.43)
pO2, Ven: 68 mmHg — ABNORMAL HIGH (ref 32–45)

## 2023-10-02 LAB — BRAIN NATRIURETIC PEPTIDE: B Natriuretic Peptide: 1989 pg/mL — ABNORMAL HIGH (ref 0.0–100.0)

## 2023-10-02 MED ORDER — FUROSEMIDE 10 MG/ML IJ SOLN: 40.0000 mg | Freq: Once | INTRAMUSCULAR | Status: DC

## 2023-10-02 NOTE — ED Triage Notes (Signed)
 Pt coming in for SOB. Pt was satting in the 70s when fire arrived. EMS gave 3 treatments, mag and solumedrol. Pt now satting better. Incidental pt had a fall around 1000 this morning with no injuries.

## 2023-10-02 NOTE — ED Notes (Signed)
 Pt back from CT, pt sating 87% when he returned. Pt placed on 2L [place]

## 2023-10-02 NOTE — ED Provider Notes (Signed)
 [place] EM[facility]GENCY DEPARTMENT AT [facility] Provider Note   CSN: [id number] Arrival date & time: 10/02/23  2228     Patient presents with: Shortness of Breath   [name] [name] is a 89 y.o. male who presents via EMS in respiratory distress.  Patient history of CAD, AAA, stroke, PVD, CHF.  He called EMS today for shortness of breath found to be saturating in the 70s when fire arrived, EMS gave him 50 mg of albuterol , 1 of Atrovent , 125 Solu-Medrol and 2 g of mag with improvement in his work of breathing. No documented COPD in the chart.  Also had a mechanical fall around 11 am, getting off of his scale at home, resulting in hitting his head. On plavix . No LOC, N/V/headache since the fall. No O2 baseline. Echo September 2024 showed EF of 30 to 35%  Level 5 caveat due to acuity of patient's presentation upon arrival.   {Add pertinent medical, surgical, social history, OB history to HPI:32947} HPI     Prior to Admission medications   Medication Sig Start Date End Date Taking? Authorizing Provider  acetaminophen  (TYLENOL ) 500 MG tablet Take 500 mg by mouth every 6 (six) hours as needed for mild pain (pain score 1-3) (or headaches).    [provider]  amLODipine  (NORVASC ) 10 MG tablet Take 1 tablet (10 mg total) by mouth daily. 09/26/23   Arrien, Elidia Sieving, MD  atorvastatin  (LIPITOR) 20 MG tablet TAKE 1 TABLET(20 MG) BY MOUTH DAILY 11/16/22   Duke, Jon Garre, PA  carvedilol  (COREG ) 12.5 MG tablet Take 1 tablet (12.5 mg total) by mouth 2 (two) times daily with a meal. 09/26/23 10/26/23  Arrien, Elidia Sieving, MD  clopidogrel  (PLAVIX ) 75 MG tablet TAKE 1 TABLET BY MOUTH EVERY DAY 05/20/23   Court Dorn PARAS, MD  feeding supplement (ENSURE PLUS HIGH PROTEIN) LIQD Take 237 mLs by mouth 2 (two) times daily between meals. 09/26/23   Arrien, Elidia Sieving, MD  furosemide  (LASIX ) 20 MG tablet Take 1 tablet (20 mg total) by mouth daily as needed for fluid or edema (as  needed in case of weight gain 2 to 3 lbs in 24 hrs or 5 lbs in 7 days.). 09/26/23   Arrien, Elidia Sieving, MD  methylcellulose (ARTIFICIAL TEARS) 1 % ophthalmic solution Place 1 drop into both eyes 3 (three) times daily as needed (for dryness).    [provider]  pantoprazole  (PROTONIX ) 20 MG tablet TAKE 1 TABLET BY MOUTH TWICE A DAY 02/08/23   Alphonsa Glendia LABOR, MD  Phenylephrine -Acetaminophen  5-325 MG CAPS Take 1 capsule by mouth every 8 (eight) hours as needed (for allergic symptoms).    [provider]  tamsulosin  (FLOMAX ) 0.4 MG CAPS capsule TAKE 1 CAPSULE EACH EVENING 07/01/23   Alphonsa Glendia LABOR, MD  traMADol  (ULTRAM ) 50 MG tablet TAKE 1 TABLET BY MOUTH EVERY 8 HOURS AS NEEDED Patient taking differently: Take 50 mg by mouth every 8 (eight) hours as needed (for pain). 05/26/23   Alphonsa Glendia LABOR, MD    Allergies: Brilinta  [ticagrelor ], Influenza vaccines, and Fluoride preparations    Review of Systems  Respiratory:  Positive for shortness of breath.   Cardiovascular:  Negative for chest pain.    Updated Vital Signs Pulse 72   Temp 98.2 F (36.8 C) (Oral)   Resp (!) 22   SpO2 100%   Physical Exam Vitals and nursing note reviewed.  Constitutional:      Appearance: He is not ill-appearing or toxic-appearing.  HENT:     Head: Normocephalic and atraumatic.     Mouth/Throat:     Mouth: Mucous membranes are moist.     Pharynx: No oropharyngeal exudate or posterior oropharyngeal erythema.  Eyes:     General:        Right eye: No discharge.        Left eye: No discharge.     Conjunctiva/sclera: Conjunctivae normal.  Cardiovascular:     Rate and Rhythm: Normal rate and regular rhythm.     Pulses: Normal pulses.  Pulmonary:     Effort: Tachypnea and accessory muscle usage present. No respiratory distress.     Breath sounds: Normal breath sounds. No rales.  Chest:     Chest wall: No mass, tenderness or edema.  Abdominal:     General: Bowel sounds are normal. There  is no distension.     Tenderness: There is no abdominal tenderness.  Musculoskeletal:        General: No deformity.     Cervical back: Neck supple.     Right lower leg: No tenderness. No edema.     Left lower leg: No tenderness. No edema.  Skin:    General: Skin is warm and dry.     Capillary Refill: Capillary refill takes less than 2 seconds.  Neurological:     General: No focal deficit present.     Mental Status: He is alert and oriented to person, place, and time. Mental status is at baseline.  Psychiatric:        Mood and Affect: Mood normal.     (all labs ordered are listed, but only abnormal results are displayed) Labs Reviewed  RESP PANEL BY RT-PCR (RSV, FLU A&B, COVID)  RVPGX2  CBC WITH DIFFERENTIAL/PLATELET  COMPREHENSIVE METABOLIC PANEL WITH GFR  BRAIN NATRIURETIC PEPTIDE  URINALYSIS, ROUTINE W REFLEX MICROSCOPIC  I-STAT VENOUS BLOOD GAS, ED    EKG: None  Radiology: No results found.  {Document cardiac monitor, telemetry assessment procedure when appropriate:32947} Procedures   Medications Ordered in the ED - No data to display  Clinical Course as of 10/02/23 2239  Sat Oct 02, 2023  2239 1 atrovent , 15 mg of albuterol , 125 solumedrol, 2 g mag. Sats initially in the 70s on RA.  [RS]    Clinical Course User Index [RS] Emmah Bratcher, Pleasant SAUNDERS, PA-C   {Click here for ABCD2, HEART and other calculators REFRESH Note before signing:1}                              Medical Decision Making 89 y/o male who presents with SOB, respiratory distress with EMS.   Tachypneic on intake, VS otherwise reassuring. Speaking in full sentences, mild accessory muscle use at this time. Abdomen soft, nondistended, nontender. GCS 15.   DDX includes but is not limited to COPD, CHF, PNA, PE, dysrhythmia, ACS.   Amount and/or Complexity of Data Reviewed Labs: ordered.    Details: CBC with mild anemia with hemoglobin of 10.6 near patient's baseline.  BNP 1989, VBG with normal  pH. Radiology: ordered.  Risk Prescription drug management.   ***  {Document critical care time when appropriate  Document review of labs and clinical decision tools ie CHADS2VASC2, etc  Document your independent review of radiology images and any outside records  Document your discussion with family members, caretakers and with consultants  Document social determinants of health affecting pt's care  Document your decision making why or  why not admission, treatments were needed:32947:::1}   Final diagnoses:  None    ED Discharge Orders     None

## 2023-10-03 ENCOUNTER — Inpatient Hospital Stay (HOSPITAL_COMMUNITY)

## 2023-10-03 DIAGNOSIS — I110 Hypertensive heart disease with heart failure: Secondary | ICD-10-CM | POA: Diagnosis not present

## 2023-10-03 DIAGNOSIS — Z9221 Personal history of antineoplastic chemotherapy: Secondary | ICD-10-CM | POA: Diagnosis not present

## 2023-10-03 DIAGNOSIS — I5043 Acute on chronic combined systolic (congestive) and diastolic (congestive) heart failure: Secondary | ICD-10-CM | POA: Diagnosis not present

## 2023-10-03 DIAGNOSIS — D631 Anemia in chronic kidney disease: Secondary | ICD-10-CM | POA: Diagnosis not present

## 2023-10-03 DIAGNOSIS — G8929 Other chronic pain: Secondary | ICD-10-CM | POA: Diagnosis not present

## 2023-10-03 DIAGNOSIS — Z833 Family history of diabetes mellitus: Secondary | ICD-10-CM | POA: Diagnosis not present

## 2023-10-03 DIAGNOSIS — R0602 Shortness of breath: Secondary | ICD-10-CM | POA: Diagnosis not present

## 2023-10-03 DIAGNOSIS — Z7902 Long term (current) use of antithrombotics/antiplatelets: Secondary | ICD-10-CM | POA: Diagnosis not present

## 2023-10-03 DIAGNOSIS — W1830XA Fall on same level, unspecified, initial encounter: Secondary | ICD-10-CM | POA: Diagnosis not present

## 2023-10-03 DIAGNOSIS — N400 Enlarged prostate without lower urinary tract symptoms: Secondary | ICD-10-CM | POA: Diagnosis not present

## 2023-10-03 DIAGNOSIS — I5023 Acute on chronic systolic (congestive) heart failure: Secondary | ICD-10-CM | POA: Diagnosis not present

## 2023-10-03 DIAGNOSIS — I739 Peripheral vascular disease, unspecified: Secondary | ICD-10-CM | POA: Diagnosis not present

## 2023-10-03 DIAGNOSIS — I160 Hypertensive urgency: Secondary | ICD-10-CM | POA: Diagnosis not present

## 2023-10-03 DIAGNOSIS — E875 Hyperkalemia: Secondary | ICD-10-CM | POA: Diagnosis not present

## 2023-10-03 DIAGNOSIS — R220 Localized swelling, mass and lump, head: Secondary | ICD-10-CM | POA: Diagnosis not present

## 2023-10-03 DIAGNOSIS — Z8572 Personal history of non-Hodgkin lymphomas: Secondary | ICD-10-CM | POA: Diagnosis not present

## 2023-10-03 DIAGNOSIS — E785 Hyperlipidemia, unspecified: Secondary | ICD-10-CM | POA: Diagnosis not present

## 2023-10-03 DIAGNOSIS — J9601 Acute respiratory failure with hypoxia: Secondary | ICD-10-CM | POA: Diagnosis not present

## 2023-10-03 DIAGNOSIS — R296 Repeated falls: Secondary | ICD-10-CM | POA: Diagnosis not present

## 2023-10-03 DIAGNOSIS — Z888 Allergy status to other drugs, medicaments and biological substances status: Secondary | ICD-10-CM | POA: Diagnosis not present

## 2023-10-03 DIAGNOSIS — I5022 Chronic systolic (congestive) heart failure: Secondary | ICD-10-CM

## 2023-10-03 DIAGNOSIS — I130 Hypertensive heart and chronic kidney disease with heart failure and stage 1 through stage 4 chronic kidney disease, or unspecified chronic kidney disease: Secondary | ICD-10-CM | POA: Diagnosis not present

## 2023-10-03 DIAGNOSIS — I700 Atherosclerosis of aorta: Secondary | ICD-10-CM | POA: Diagnosis not present

## 2023-10-03 DIAGNOSIS — Z87891 Personal history of nicotine dependence: Secondary | ICD-10-CM | POA: Diagnosis not present

## 2023-10-03 DIAGNOSIS — I2510 Atherosclerotic heart disease of native coronary artery without angina pectoris: Secondary | ICD-10-CM | POA: Diagnosis not present

## 2023-10-03 DIAGNOSIS — Z887 Allergy status to serum and vaccine status: Secondary | ICD-10-CM | POA: Diagnosis not present

## 2023-10-03 DIAGNOSIS — J811 Chronic pulmonary edema: Secondary | ICD-10-CM | POA: Diagnosis not present

## 2023-10-03 DIAGNOSIS — I509 Heart failure, unspecified: Secondary | ICD-10-CM | POA: Diagnosis present

## 2023-10-03 DIAGNOSIS — Z8249 Family history of ischemic heart disease and other diseases of the circulatory system: Secondary | ICD-10-CM | POA: Diagnosis not present

## 2023-10-03 DIAGNOSIS — Z955 Presence of coronary angioplasty implant and graft: Secondary | ICD-10-CM | POA: Diagnosis not present

## 2023-10-03 DIAGNOSIS — Z1152 Encounter for screening for COVID-19: Secondary | ICD-10-CM | POA: Diagnosis not present

## 2023-10-03 DIAGNOSIS — N1831 Chronic kidney disease, stage 3a: Secondary | ICD-10-CM | POA: Diagnosis not present

## 2023-10-03 DIAGNOSIS — D696 Thrombocytopenia, unspecified: Secondary | ICD-10-CM | POA: Diagnosis not present

## 2023-10-03 LAB — BASIC METABOLIC PANEL WITH GFR
Anion gap: 11 (ref 5–15)
BUN: 30 mg/dL — ABNORMAL HIGH (ref 8–23)
CO2: 21 mmol/L — ABNORMAL LOW (ref 22–32)
Calcium: 8.1 mg/dL — ABNORMAL LOW (ref 8.9–10.3)
Chloride: 104 mmol/L (ref 98–111)
Creatinine, Ser: 1.49 mg/dL — ABNORMAL HIGH (ref 0.61–1.24)
GFR, Estimated: 45 mL/min — ABNORMAL LOW (ref >60)
Glucose, Bld: 159 mg/dL — ABNORMAL HIGH (ref 70–99)
Potassium: 4.6 mmol/L (ref 3.5–5.1)
Sodium: 136 mmol/L (ref 135–145)

## 2023-10-03 LAB — COMPREHENSIVE METABOLIC PANEL WITH GFR
ALT: 12 U/L (ref 0–44)
AST: 14 U/L — ABNORMAL LOW (ref 15–41)
Albumin: 3.1 g/dL — ABNORMAL LOW (ref 3.5–5.0)
Alkaline Phosphatase: 62 U/L (ref 38–126)
Anion gap: 10 (ref 5–15)
BUN: 31 mg/dL — ABNORMAL HIGH (ref 8–23)
CO2: 20 mmol/L — ABNORMAL LOW (ref 22–32)
Calcium: 8 mg/dL — ABNORMAL LOW (ref 8.9–10.3)
Chloride: 108 mmol/L (ref 98–111)
Creatinine, Ser: 1.39 mg/dL — ABNORMAL HIGH (ref 0.61–1.24)
GFR, Estimated: 49 mL/min — ABNORMAL LOW (ref >60)
Glucose, Bld: 137 mg/dL — ABNORMAL HIGH (ref 70–99)
Potassium: 5.1 mmol/L (ref 3.5–5.1)
Sodium: 138 mmol/L (ref 135–145)
Total Bilirubin: 0.9 mg/dL (ref 0.0–1.2)
Total Protein: 5.9 g/dL — ABNORMAL LOW (ref 6.5–8.1)

## 2023-10-03 LAB — URINALYSIS, ROUTINE W REFLEX MICROSCOPIC
Bilirubin Urine: NEGATIVE
Glucose, UA: NEGATIVE mg/dL
Hgb urine dipstick: NEGATIVE
Ketones, ur: NEGATIVE mg/dL
Leukocytes,Ua: NEGATIVE
Nitrite: NEGATIVE
Protein, ur: NEGATIVE mg/dL
Specific Gravity, Urine: 1.006 (ref 1.005–1.030)
pH: 5 (ref 5.0–8.0)

## 2023-10-03 LAB — ECHOCARDIOGRAM COMPLETE
Area-P 1/2: 3.61 cm2
Calc EF: 37.9 %
Height: 67.717 in
MV VTI: 3.29 cm2
S' Lateral: 3.1 cm
Single Plane A2C EF: 36.6 %
Single Plane A4C EF: 36.7 %
Weight: 2398.4 [oz_av]

## 2023-10-03 LAB — I-STAT CHEM 8, ED
BUN: 49 mg/dL — ABNORMAL HIGH (ref 8–23)
Calcium, Ion: 1.02 mmol/L — ABNORMAL LOW (ref 1.15–1.40)
Chloride: 107 mmol/L (ref 98–111)
Creatinine, Ser: 1.4 mg/dL — ABNORMAL HIGH (ref 0.61–1.24)
Glucose, Bld: 129 mg/dL — ABNORMAL HIGH (ref 70–99)
HCT: 34 % — ABNORMAL LOW (ref 39.0–52.0)
Hemoglobin: 11.6 g/dL — ABNORMAL LOW (ref 13.0–17.0)
Potassium: 7 mmol/L (ref 3.5–5.1)
Sodium: 135 mmol/L (ref 135–145)
TCO2: 25 mmol/L (ref 22–32)

## 2023-10-03 LAB — PHOSPHORUS: Phosphorus: 2.6 mg/dL (ref 2.5–4.6)

## 2023-10-03 LAB — MAGNESIUM: Magnesium: 1.7 mg/dL (ref 1.7–2.4)

## 2023-10-03 MED ORDER — CARVEDILOL 12.5 MG PO TABS
12.5000 mg | ORAL_TABLET | Freq: Two times a day (BID) | ORAL | Status: DC
  Administered 2023-10-03 – 2023-10-05 (×5): 12.5 mg via ORAL
  Filled 2023-10-03 (×5): qty 1

## 2023-10-03 MED ORDER — FUROSEMIDE 10 MG/ML IJ SOLN
40.0000 mg | Freq: Once | INTRAMUSCULAR | Status: AC
  Administered 2023-10-03: 40 mg via INTRAVENOUS
  Filled 2023-10-03: qty 4

## 2023-10-03 MED ORDER — CLOPIDOGREL BISULFATE 75 MG PO TABS
75.0000 mg | ORAL_TABLET | Freq: Every day | ORAL | Status: DC
  Administered 2023-10-03 – 2023-10-05 (×3): 75 mg via ORAL
  Filled 2023-10-03 (×3): qty 1

## 2023-10-03 MED ORDER — ATORVASTATIN CALCIUM 10 MG PO TABS
20.0000 mg | ORAL_TABLET | Freq: Every day | ORAL | Status: DC
  Administered 2023-10-03 – 2023-10-05 (×3): 20 mg via ORAL
  Filled 2023-10-03 (×3): qty 2

## 2023-10-03 MED ORDER — TRAMADOL HCL 50 MG PO TABS
50.0000 mg | ORAL_TABLET | Freq: Three times a day (TID) | ORAL | Status: DC | PRN
  Administered 2023-10-05: 50 mg via ORAL
  Filled 2023-10-03: qty 1

## 2023-10-03 MED ORDER — AMLODIPINE BESYLATE 10 MG PO TABS
10.0000 mg | ORAL_TABLET | Freq: Every day | ORAL | Status: DC
  Administered 2023-10-03 – 2023-10-04 (×2): 10 mg via ORAL
  Filled 2023-10-03 (×2): qty 1

## 2023-10-03 MED ORDER — FUROSEMIDE 10 MG/ML IJ SOLN
40.0000 mg | Freq: Two times a day (BID) | INTRAMUSCULAR | Status: DC
  Administered 2023-10-03 – 2023-10-05 (×4): 40 mg via INTRAVENOUS
  Filled 2023-10-03 (×4): qty 4

## 2023-10-03 MED ORDER — TAMSULOSIN HCL 0.4 MG PO CAPS
0.4000 mg | ORAL_CAPSULE | Freq: Every day | ORAL | Status: DC
  Administered 2023-10-03 – 2023-10-04 (×2): 0.4 mg via ORAL
  Filled 2023-10-03 (×2): qty 1

## 2023-10-03 MED ORDER — PANTOPRAZOLE SODIUM 20 MG PO TBEC
20.0000 mg | DELAYED_RELEASE_TABLET | Freq: Two times a day (BID) | ORAL | Status: DC
  Administered 2023-10-03 – 2023-10-05 (×5): 20 mg via ORAL
  Filled 2023-10-03 (×5): qty 1

## 2023-10-03 MED ORDER — SODIUM CHLORIDE 0.9% FLUSH
3.0000 mL | Freq: Two times a day (BID) | INTRAVENOUS | Status: DC
  Administered 2023-10-03 – 2023-10-05 (×6): 3 mL via INTRAVENOUS

## 2023-10-03 NOTE — Progress Notes (Signed)
 Echocardiogram 2D Echocardiogram has been performed.  Damien FALCON Elesa Garman RDCS 10/03/2023, 11:18 AM

## 2023-10-03 NOTE — Evaluation (Signed)
 Occupational Therapy Evaluation Patient Details Name: [name] [name] MRN: [id number] DOB: 10-20-1934 Today's Date: 10/03/2023   History of Present Illness   The pt is an 89 yo male presenting 7/5 with SOB, hypoxic to 70s upon arrival, fall at 10:00AM without injury. PMH includes: admission 6/27-6/29 with elevated creatinine and admitted for HTN and acute renal failure, CHF, HTN, CAD, PVD, STEMI x2, CVA x2.     Clinical Impressions At baseline, pt is Ind to Mod I with ADLs and performs functional transfers/mobility with a RW with Mod I. However, pt reports approx 1 fall per month, including fall leading to this admission. Pt now presents with decreased activity tolerance, decreased balance, B UE generalized weakness, impaired cardiopulmonary status, and decreased safety and independence with functional tasks. Pt currently demonstrates ability to complete UB ADLs with Set up to Contact guard assist, LB ADLs with Min to Mod assist, and functional mobility/transfers with a RW with Min to Mod assist. Pt participated well in session but was limited by an episode of decreased BP in standing with pt reporting no dizziness or lightheadedness during session. Pt will benefit from acute skilled OT services to address deficits outlined below and to increase safety and independence with functional tasks. Post acute discharge, pt will benefit from intensive inpatient skilled rehab services < 3 hours per day to maximize rehab potential.      If plan is discharge home, recommend the following:   A little help with walking and/or transfers;A lot of help with bathing/dressing/bathroom;Assistance with cooking/housework;Assist for transportation;Help with stairs or ramp for entrance     Functional Status Assessment   Patient has had a recent decline in their functional status and demonstrates the ability to make significant improvements in function in a reasonable and predictable amount of time.      Equipment Recommendations   BSC/3in1     Recommendations for Other Services         Precautions/Restrictions   Precautions Precautions: Fall;Other (comment) Recall of Precautions/Restrictions: Intact Precaution/Restrictions Comments: watch BP Restrictions Weight Bearing Restrictions Per Provider Order: No     Mobility Bed Mobility Overal bed mobility: Needs Assistance Bed Mobility: Supine to Sit, Sit to Supine     Supine to sit: Contact guard, Min assist, HOB elevated Sit to supine: Contact guard assist, HOB elevated   General bed mobility comments: Largely CGA with Min assist needed to scoot hips to EOB    Transfers Overall transfer level: Needs assistance Equipment used: Rolling walker (2 wheels) Transfers: Sit to/from Stand, Bed to chair/wheelchair/BSC Sit to Stand: Min assist, Mod assist (largely Min assist with Mod assist needed for controlled sit to low surface (toilet))     Step pivot transfers: Min assist     General transfer comment: cues for safety and hand placement      Balance Overall balance assessment: Needs assistance Sitting-balance support: No upper extremity supported, Feet supported Sitting balance-Leahy Scale: Fair Sitting balance - Comments: seated EOB   Standing balance support: Single extremity supported, Bilateral upper extremity supported, During functional activity, Reliant on assistive device for balance Standing balance-Leahy Scale: Poor Standing balance comment: reliant on external support                           ADL either performed or assessed with clinical judgement   ADL Overall ADL's : Needs assistance/impaired Eating/Feeding: Set up;Sitting   Grooming: Wash/dry hands;Wash/dry face;Contact guard assist;Standing   Upper Body Bathing: Contact guard  assist;Sitting;Supervision/ safety;Set up (assist with telemetry lined)   Lower Body Bathing: Minimal assistance;Sitting/lateral leans;Moderate  assistance;Sit to/from stand;Cueing for safety;Cueing for compensatory techniques Lower Body Bathing Details (indicate cue type and reason): largely Min assist with Mod assist needed for pericare Upper Body Dressing : Set up;Sitting   Lower Body Dressing: Contact guard assist;Sit to/from stand;Cueing for safety   Toilet Transfer: Minimal assistance;Moderate assistance;Cueing for safety;Ambulation;Regular Toilet;Rolling walker (2 wheels);Grab bars Toilet Transfer Details (indicate cue type and reason): Mod assist for controlled sit and Min assist for remainder of transfer including power up from toilet Toileting- Clothing Manipulation and Hygiene: Moderate assistance;Sit to/from stand Toileting - Clothing Manipulation Details (indicate cue type and reason): Mod assist for peri care     Functional mobility during ADLs: Minimal assistance;Cueing for safety;Rolling walker (2 wheels)       Vision Baseline Vision/History: 0 No visual deficits Ability to See in Adequate Light: 0 Adequate Patient Visual Report: No change from baseline Vision Assessment?: No apparent visual deficits Additional Comments: Pt vision appears WFL. Pt reports no recent changes in vision. Vision not formally screened.     Perception         Praxis         Pertinent Vitals/Pain Pain Assessment Pain Assessment: No/denies pain     Extremity/Trunk Assessment Upper Extremity Assessment Upper Extremity Assessment: Right hand dominant;Generalized weakness   Lower Extremity Assessment Lower Extremity Assessment: Defer to PT evaluation   Cervical / Trunk Assessment Cervical / Trunk Assessment: Kyphotic   Communication Communication Communication: Impaired Factors Affecting Communication: Hearing impaired   Cognition Arousal: Alert Behavior During Therapy: WFL for tasks assessed/performed Cognition: Difficult to assess Difficult to assess due to: Hard of hearing/deaf           OT - Cognition Comments:  Pt requiring increased time for processing and occasionally requiring repeated instructions/questions. However, pt is HOH and OT suspects deficits are related to this.                 Following commands: Intact       Cueing  General Comments   Cueing Techniques: Verbal cues;Visual cues;Gestural cues  Pt with decreased BP in standing. HR in the 80s to 90s with noted PVCs. O2 sat >/95% on RA throughout session. Pt reported no dizziness or lightheadedness this session.   Exercises     Shoulder Instructions      Home Living Family/patient expects to be discharged to:: Private residence Living Arrangements: Spouse/significant other Available Help at Discharge: Family;Available 24 hours/day (daughter) Type of Home: House Home Access: Stairs to enter [employer] of Steps: 3 Entrance Stairs-Rails: Left Home Layout: One level     Bathroom Shower/Tub: Producer, television/film/video: Handicapped height Bathroom Accessibility: Yes How Accessible: Accessible via walker Home Equipment: Rolling Walker (2 wheels);Shower seat;Cane - single point          Prior Functioning/Environment Prior Level of Function : Independent/Modified Independent;History of Falls (last six months)             Mobility Comments: Pt is typically Mod I utilizing RW for mobilization. Pt reports  approx 1 fall per month with last fall 7/4 (night before admission) due to dizziness ADLs Comments: Pt is typically Independent to Mod I with ADLs.    OT Problem List: Decreased strength;Decreased activity tolerance;Impaired balance (sitting and/or standing);Cardiopulmonary status limiting activity   OT Treatment/Interventions: Self-care/ADL training;Therapeutic exercise;Energy conservation;DME and/or AE instruction;Therapeutic activities;Patient/family education;Balance training      OT  Goals(Current goals can be found in the care plan section)   Acute Rehab OT Goals Patient Stated Goal:  to return home OT Goal Formulation: With patient Time For Goal Achievement: 10/17/23 Potential to Achieve Goals: Good ADL Goals Pt Will Perform Grooming: with supervision;standing Pt Will Perform Lower Body Bathing: with supervision;sit to/from stand;sitting/lateral leans Pt Will Perform Lower Body Dressing: with modified independence;sitting/lateral leans;sit to/from stand Pt Will Transfer to Toilet: with supervision;ambulating;regular height toilet (with least restrictive AD) Pt Will Perform Toileting - Clothing Manipulation and hygiene: with supervision;sitting/lateral leans;sit to/from stand   OT Frequency:  Min 2X/week    Co-evaluation              AM-PAC OT 6 Clicks Daily Activity     Outcome Measure Help from another person eating meals?: A Little Help from another person taking care of personal grooming?: A Little Help from another person toileting, which includes using toliet, bedpan, or urinal?: A Lot Help from another person bathing (including washing, rinsing, drying)?: A Little Help from another person to put on and taking off regular upper body clothing?: A Little Help from another person to put on and taking off regular lower body clothing?: A Lot 6 Click Score: 16   End of Session Equipment Utilized During Treatment: Gait belt;Rolling walker (2 wheels) Nurse Communication: Mobility status;Other (comment)  Activity Tolerance: Patient tolerated treatment well;Treatment limited secondary to medical complications (Comment) (Limited by decreased BP in standing) Patient left: in bed;with call bell/phone within reach;with bed alarm set  OT Visit Diagnosis: Unsteadiness on feet (R26.81);Repeated falls (R29.6);Other abnormalities of gait and mobility (R26.89);Muscle weakness (generalized) (M62.81)                Time: 8766-8740 OT Time Calculation (min): 26 min Charges:  OT General Charges $OT Visit: 1 Visit OT Evaluation $OT Eval Low Complexity: 1 Low OT  Treatments $Self Care/Home Management : 8-22 mins  Margarie Rockey HERO., OTR/L, MA Acute Rehab [phone]   Margarie FORBES Horns 10/03/2023, 1:32 PM

## 2023-10-03 NOTE — Progress Notes (Addendum)
 PROGRESS NOTE    [name] [name]  FMW:[id number] DOB: Nov 20, 1934 DOA: 10/02/2023 PCP: Alphonsa Glendia LABOR, MD  89 y.o. 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  Back in the ED 7/5 with acute progressive shortness of breath x 2-3 days.  Appears that he was discharged with Lasix  prn for weight gain but had not been using despite weight going up.  Weight has gone from 136 LB -> 144 LB over the past week.   Subjective: -Starting to feel better  Assessment and Plan:  Acute on chronic heart failure with reduced ejection fraction Acute hypoxic respiratory failure  Last TTE 9/' 24 LVEF 30 to 35%, global hypokinesis, severe concentric LVH, grade 2 diastolic dysfunction,  - Recent admission with AKI, creatinine of 3.9, improved with gentle hydration, stopping Entresto  and holding diuretics, discharged last week on Lasix  PRN which he did not use - Continue IV Lasix  today, Coreg , continue to hold Entresto  , borderline candidate for SGLT2i with BPH -Unfortunately eats from [employer] or [employer] every day, sausages, hot dogs etc. counseled - Would benefit from close follow-up with TOC   Ground level fall  Generalized weakness, deconditioning  CT head without acute abnormality.  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  Not felt to be candidate for repair due to complexity of endovascular repair and risks of open surgery.  History large B-cell lymphoma s/p chemo/rituximab  in remission: - Noted outpatient surveillance.  CKD3a: Baseline creatinine approximately 1.4     BPH: Continue home tamsulosin    Anemia / thrombocytopenia: Chronic, stable   Chronic pain: tramadol  prn continued    DVT prophylaxis: SCDs with thrombocytopenia Code Status: Full code Family Communication: None present Disposition Plan: Home likely 48 hours  Consultants:    Procedures:   Antimicrobials:    Objective: Vitals:   10/03/23 0256 10/03/23 0318 10/03/23 0323 10/03/23 0739  BP:  (!) 164/95  (!) 152/73  Pulse:  80    Resp:  (!) 25  15  Temp: 98.7 F (37.1 C) 99.1 F (37.3 C)  98.7 F (37.1 C)  TempSrc: Oral Oral  Oral  SpO2:  93%  94%  Weight:   68 kg   Height:   5' 7.72 (1.72 m)     Intake/Output Summary (Last 24 hours) at 10/03/2023 1031 Last data filed at 10/03/2023 0630 Gross per 24 hour  Intake --  Output 250 ml  Net -250 ml   Filed Weights   10/03/23 0323  Weight: 68 kg    Examination:  General exam: Appears calm and comfortable  Respiratory system: Clear to auscultation Cardiovascular system: S1 & S2 heard, RRR.  Abd: nondistended, soft and nontender.Normal bowel sounds heard. Central nervous system: Alert and oriented. No focal neurological deficits. Extremities: no edema Skin: No rashes  Psychiatry:  Mood & affect appropriate.     Data Reviewed:   CBC: Recent Labs  Lab 10/02/23 2247 10/02/23 2250 10/03/23 0051  WBC  --  9.6  --   NEUTROABS  --  8.0*  --   HGB 10.5* 10.6* 11.6*  HCT 31.0* 32.6* 34.0*  MCV  --  91.6  --   PLT  --  108*  --    Basic Metabolic Panel: Recent Labs  Lab 09/29/23 1647 10/02/23 2247 10/02/23 2340  10/03/23 0051 10/03/23 0346  NA 144 135 138 135 136  K 5.5* 6.2* 5.1 7.0* 4.6  CL 110*  --  108 107 104  CO2 19*  --  20*  --  21*  GLUCOSE 98  --  137* 129* 159*  BUN 43*  --  31* 49* 30*  CREATININE 1.49*  --  1.39* 1.40* 1.49*  CALCIUM  7.9*  --  8.0*  --  8.1*  MG  --   --   --   --  1.7  PHOS  --   --   --   --  2.6   GFR: Estimated Creatinine Clearance: 34.1 mL/min (A) (by C-G formula based on SCr of 1.49 mg/dL (H)). Liver Function Tests: Recent Labs  Lab 10/02/23 2340  AST 14*  ALT 12  ALKPHOS 62  BILITOT 0.9  PROT 5.9*  ALBUMIN 3.1*   No results for input(s): LIPASE, AMYLASE in the last 168 hours. No results for input(s): AMMONIA in the last 168 hours. Coagulation Profile: No results for input(s): INR, PROTIME in the last 168 hours. Cardiac Enzymes: No results for input(s): CKTOTAL, CKMB, CKMBINDEX, TROPONINI in the last 168 hours. BNP (last 3 results) No results for input(s): PROBNP in the last 8760 hours. HbA1C: No results for input(s): HGBA1C in the last 72 hours. CBG: No results for input(s): GLUCAP in the last 168 hours. Lipid Profile: No results for input(s): CHOL, HDL, LDLCALC, TRIG, CHOLHDL, LDLDIRECT in the last 72 hours. Thyroid Function Tests: No results for input(s): TSH, T4TOTAL, FREET4, T3FREE, THYROIDAB in the last 72 hours. Anemia Panel: No results for input(s): VITAMINB12, FOLATE, FERRITIN, TIBC, IRON, RETICCTPCT in the last 72 hours. Urine analysis:    Component Value Date/Time   COLORURINE YELLOW 10/02/2023 2239   APPEARANCEUR HAZY (A) 10/02/2023 2239   LABSPEC 1.006 10/02/2023 2239   PHURINE 5.0 10/02/2023 2239   GLUCOSEU NEGATIVE 10/02/2023 2239   HGBUR NEGATIVE 10/02/2023 2239   BILIRUBINUR NEGATIVE 10/02/2023 2239   KETONESUR NEGATIVE 10/02/2023 2239   PROTEINUR NEGATIVE 10/02/2023 2239   NITRITE NEGATIVE 10/02/2023 2239   LEUKOCYTESUR NEGATIVE 10/02/2023 2239   Sepsis  Labs: @LABRCNTIP (procalcitonin:4,lacticidven:4)  ) Recent Results (from the past 240 hours)  Resp panel by RT-PCR (RSV, Flu A&B, Covid) Anterior Nasal Swab     Status: None   Collection Time: 10/02/23 10:39 PM   Specimen: Anterior Nasal Swab  Result Value Ref Range Status   SARS Coronavirus 2 by RT PCR NEGATIVE NEGATIVE Final   Influenza A by PCR NEGATIVE NEGATIVE Final   Influenza B by PCR NEGATIVE NEGATIVE Final    Comment: (NOTE) The Xpert Xpress SARS-CoV-2/FLU/RSV plus assay is intended as an aid in the diagnosis of influenza from Nasopharyngeal swab specimens and should not be used as a sole basis for treatment. Nasal washings and aspirates are unacceptable for Xpert Xpress SARS-CoV-2/FLU/RSV testing.  Fact Sheet for Patients: BloggerCourse.com  Fact Sheet for Healthcare Providers: SeriousBroker.it  This test is not yet approved  or cleared by the [place]  FDA and has been authorized for detection and/or diagnosis of SARS-CoV-2 by FDA under an Emergency Use Authorization (EUA). This EUA will remain in effect (meaning this test can be used) for the duration of the COVID-19 declaration under Section 564(b)(1) of the Act, 21 U.S.C. section 360bbb-3(b)(1), unless the authorization is terminated or revoked.     Resp Syncytial Virus by PCR NEGATIVE NEGATIVE Final    Comment: (NOTE) Fact Sheet for Patients: BloggerCourse.com  Fact Sheet for Healthcare Providers: SeriousBroker.it  This test is not yet approved or cleared by the [place]  FDA and has been authorized for detection and/or diagnosis of SARS-CoV-2 by FDA under an Emergency Use Authorization (EUA). This EUA will remain in effect (meaning this test can be used) for the duration of the COVID-19 declaration under Section 564(b)(1) of the Act, 21 U.S.C. section 360bbb-3(b)(1), unless the authorization is  terminated or revoked.  Performed at [facility], [address], [place], [place] [address]      Radiology Studies: CT Head Wo Contrast Result Date: 10/02/2023 CLINICAL DATA:  Fall, on Plavix  EXAM: CT HEAD WITHOUT CONTRAST TECHNIQUE: Contiguous axial images were obtained from the base of the skull through the vertex without intravenous contrast. RADIATION DOSE REDUCTION: This exam was performed according to the departmental dose-optimization program which includes automated exposure control, adjustment of the mA and/or kV according to patient size and/or use of iterative reconstruction technique. COMPARISON:  03/01/2022 FINDINGS: Brain: There is atrophy and chronic small vessel disease changes. No acute intracranial abnormality. Specifically, no hemorrhage, hydrocephalus, mass lesion, acute infarction, or significant intracranial injury. Vascular: No hyperdense vessel or unexpected calcification. Skull: No acute calvarial abnormality. Sinuses/Orbits: No acute findings Other: Left frontal scalp nodule is again noted, measuring 2 cm, favored to represent sebaceous cyst. IMPRESSION: Atrophy, chronic microvascular disease. No acute intracranial abnormality. Electronically Signed   By: Franky Crease M.D.   On: 10/02/2023 23:13   DG Chest Port 1 View Result Date: 10/02/2023 CLINICAL DATA:  SOB EXAM: PORTABLE CHEST 1 VIEW COMPARISON:  CT chest 06/09/2023, chest x-ray 03/01/2022 FINDINGS: The heart and mediastinal contours are unchanged. Atherosclerotic plaque. No focal consolidation. Mild pulmonary edema. No pleural effusion. No pneumothorax. No acute osseous abnormality. Bilateral shoulder degenerative changes. IMPRESSION: 1. Mild pulmonary edema. 2.  Aortic Atherosclerosis (ICD10-I70.0). Electronically Signed   By: Morgane  Naveau M.D.   On: 10/02/2023 22:52     Scheduled Meds:  amLODipine   10 mg Oral Daily   atorvastatin   20 mg Oral Daily   carvedilol   12.5 mg Oral BID WC   clopidogrel   75 mg  Oral Daily   furosemide   40 mg Intravenous BID   pantoprazole   20 mg Oral BID   sodium chloride  flush  3 mL Intravenous Q12H   tamsulosin   0.4 mg Oral QPC supper   Continuous Infusions:   LOS: 0 days    Time spent:    Sigurd Pac, MD [facility]   10/03/2023, 10:31 AM

## 2023-10-03 NOTE — Evaluation (Signed)
 Physical Therapy Evaluation Patient Details Name: [name] [name] MRN: [id number] DOB: 01-Apr-1934 Today's Date: 10/03/2023  History of Present Illness  The pt is an 89 yo male presenting 7/5 with SOB, hypoxic to 70s upon arrival, fall at 10:00AM without injury. PMH includes: admission 6/27-6/29 with elevated creatinine and admitted for HTN and acute renal failure, CHF, HTN, CAD, PVD, STEMI x2, CVA x2.   Clinical Impression  Pt in bed upon arrival of PT, agreeable to evaluation at this time. Prior to admission the pt was ambulating with use of RW, reports 1 fall since recent d/c home. The pt was able to complete bed mobility without assistance but is currently dependent on min-modA to complete sit-stand transfers and short bout of walking with RW. He had increased bilateral knee buckling with stance and reports lightheadedness. Further mobility limited by fatigue, need for assistance, and drop in BP. Given current mobility, recommend continued skilled PT to progress functional strength, endurance, and stability prior to return home with family support.   VITALS:  - supine in bed - BP: 152/73 (97); *prior to PT session*3 - sitting EOB - BP: 131/85 (98); HR: 86bpm - sitting in chair post-transfer - BP: 119/84 (94); HR: 81bpm *pt reports dizzy* - standing - BP: 106/75 (85); HR: 80bpm      If plan is discharge home, recommend the following: A lot of help with walking and/or transfers;A lot of help with bathing/dressing/bathroom;Assistance with cooking/housework;Assistance with feeding;Direct supervision/assist for medications management;Direct supervision/assist for financial management;Assist for transportation;Help with stairs or ramp for entrance   Can travel by private vehicle   No    Equipment Recommendations None recommended by PT  Recommendations for Other Services       Functional Status Assessment Patient has had a recent decline in their functional status and demonstrates the  ability to make significant improvements in function in a reasonable and predictable amount of time.     Precautions / Restrictions Precautions Precautions: Fall Recall of Precautions/Restrictions: Intact Precaution/Restrictions Comments: watch BP Restrictions Weight Bearing Restrictions Per Provider Order: No      Mobility  Bed Mobility Overal bed mobility: Needs Assistance Bed Mobility: Supine to Sit     Supine to sit: Contact guard, Used rails     General bed mobility comments: from bed flat, increased time and use of rails    Transfers Overall transfer level: Needs assistance Equipment used: Rolling walker (2 wheels) Transfers: Bed to chair/wheelchair/BSC, Sit to/from Stand Sit to Stand: Min assist, Mod assist   Step pivot transfers: Mod assist       General transfer comment: modA to rise initially, minA on repeated attempts, modA to maintain upright with bilateral knee buckling and BUE support on RW    Ambulation/Gait Ambulation/Gait assistance: Mod assist Gait Distance (Feet): 3 Feet Assistive device: Rolling walker (2 wheels) Gait Pattern/deviations: Step-to pattern, Decreased stride length, Knees buckling Gait velocity: reduced Gait velocity interpretation: <1.31 ft/sec, indicative of household ambulator   General Gait Details: small lateral steps with bilateral knee buckling. incoordinated movements of LE for stepping but pt attempting to move knees to reduce stiffness while ambulating.     Balance Overall balance assessment: Needs assistance Sitting-balance support: No upper extremity supported, Feet supported Sitting balance-Leahy Scale: Fair Sitting balance - Comments: seated EOB   Standing balance support: Bilateral upper extremity supported, During functional activity, Reliant on assistive device for balance Standing balance-Leahy Scale: Poor Standing balance comment: reliant on external support  Pertinent Vitals/Pain Pain Assessment Pain Assessment: No/denies pain    Home Living Family/patient expects to be discharged to:: Private residence Living Arrangements: Spouse/significant other Available Help at Discharge: Family;Available 24 hours/day (daughter) Type of Home: House Home Access: Stairs to enter Entrance Stairs-Rails: Left Entrance Stairs-Number of Steps: 3   Home Layout: One level Home Equipment: Agricultural consultant (2 wheels);Shower seat;Cane - single point      Prior Function Prior Level of Function : Independent/Modified Independent;History of Falls (last six months)             Mobility Comments: mod I utilizing RW for mobilization. Pt reports  approx 1 fall per month, last fall 7/4 (night before admission) due to dizziness ADLs Comments: independent     Extremity/Trunk Assessment   Upper Extremity Assessment Upper Extremity Assessment: Defer to OT evaluation;Right hand dominant    Lower Extremity Assessment Lower Extremity Assessment: Generalized weakness (grossly 4/5 to MMT, but with knee buckling in stance)    Cervical / Trunk Assessment Cervical / Trunk Assessment: Kyphotic  Communication   Communication Communication: Impaired Factors Affecting Communication: Hearing impaired    Cognition Arousal: Alert Behavior During Therapy: WFL for tasks assessed/performed   PT - Cognitive impairments: No apparent impairments                         Following commands: Intact       Cueing Cueing Techniques: Verbal cues, Visual cues     General Comments General comments (skin integrity, edema, etc.): bp drop in sitting and standing from supine with pt reporting dizziness. Spo2 stable    Exercises     Assessment/Plan    PT Assessment Patient needs continued PT services  PT Problem List Decreased strength;Decreased activity tolerance;Decreased balance;Decreased mobility;Decreased coordination;Decreased knowledge of use of  DME       PT Treatment Interventions DME instruction;Gait training;Stair training;Functional mobility training;Therapeutic activities;Therapeutic exercise;Balance training;Patient/family education;Wheelchair mobility training;Manual techniques;Modalities    PT Goals (Current goals can be found in the Care Plan section)  Acute Rehab PT Goals Patient Stated Goal: to go home when stronger PT Goal Formulation: With patient Time For Goal Achievement: 10/17/23 Potential to Achieve Goals: Fair    Frequency Min 2X/week        AM-PAC PT 6 Clicks Mobility  Outcome Measure Help needed turning from your back to your side while in a flat bed without using bedrails?: A Little Help needed moving from lying on your back to sitting on the side of a flat bed without using bedrails?: A Little Help needed moving to and from a bed to a chair (including a wheelchair)?: A Lot Help needed standing up from a chair using your arms (e.g., wheelchair or bedside chair)?: A Lot Help needed to walk in hospital room?: Total Help needed climbing 3-5 steps with a railing? : Total 6 Click Score: 12    End of Session Equipment Utilized During Treatment: Gait belt Activity Tolerance: Patient tolerated treatment well Patient left: in chair;with call bell/phone within reach;with chair alarm set Nurse Communication: Mobility status PT Visit Diagnosis: Unsteadiness on feet (R26.81);Muscle weakness (generalized) (M62.81)    Time: 9083-9053 PT Time Calculation (min) (ACUTE ONLY): 30 min   Charges:   PT Evaluation $PT Eval Moderate Complexity: 1 Mod PT Treatments $Therapeutic Exercise: 8-22 mins           Izetta Call, PT, DPT   Acute Rehabilitation Department Office [phone] Secure Chat Communication Preferred  Izetta JULIANNA Call  10/03/2023, 2:17 PM
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
## 2023-10-04 ENCOUNTER — Encounter (HOSPITAL_COMMUNITY): Payer: Self-pay | Admitting: Internal Medicine

## 2023-10-04 DIAGNOSIS — I5043 Acute on chronic combined systolic (congestive) and diastolic (congestive) heart failure: Secondary | ICD-10-CM | POA: Diagnosis not present

## 2023-10-04 LAB — BASIC METABOLIC PANEL WITH GFR
Anion gap: 10 (ref 5–15)
BUN: 40 mg/dL — ABNORMAL HIGH (ref 8–23)
CO2: 24 mmol/L (ref 22–32)
Calcium: 8.1 mg/dL — ABNORMAL LOW (ref 8.9–10.3)
Chloride: 102 mmol/L (ref 98–111)
Creatinine, Ser: 1.63 mg/dL — ABNORMAL HIGH (ref 0.61–1.24)
GFR, Estimated: 41 mL/min — ABNORMAL LOW (ref >60)
Glucose, Bld: 99 mg/dL (ref 70–99)
Potassium: 4.5 mmol/L (ref 3.5–5.1)
Sodium: 136 mmol/L (ref 135–145)

## 2023-10-04 MED ORDER — AMLODIPINE BESYLATE 5 MG PO TABS
5.0000 mg | ORAL_TABLET | Freq: Every day | ORAL | Status: DC
  Administered 2023-10-05: 5 mg via ORAL
  Filled 2023-10-04: qty 1

## 2023-10-04 NOTE — Progress Notes (Addendum)
 HEART & VASCULAR TRANSITION OF CARE CONSULT NOTE    Referring Physician: Dr. Fairy PCP: Alphonsa Glendia LABOR, MD  Cardiologist: Dorn Lesches, MD  Chief Complaint: Heart failure  HPI: Referred to clinic by Dr. Fairy for heart failure consultation.   [name] LIEBLER is a 89 y.o. male with chronic CHF, CAD, B-cell lymphoma, AAA, hypertension, hyperlipidemia and BPH.    He was recently admitted in June with acute renal failure. Renal CT with no acute localizing process in the abdomen or pelvis.  Entresto  stopped and started on amlodipine . PRN Lasix  added at discharge.  He presented back to the ED 7/5 with SOB and weight gain. Echo 7/6 with LVEF 35-40% (30-35% in 11/2022), RWMA, moderate concentric LVH, G1DD, RV normal.  He had not been taking his Lasix  as prescribed as he was confused with PRN instructions.  Admitted with AKI, SCR up to 3.9.  GDMT was limited.  Per chart review he eats [employer] or [employer] every day.  Discharged on Coreg  and lasix .   Today he returns for A[facility] clinic visit with his daughter. Overall feeling ok. Denies palpitations, CP, dizziness, edema, or PND/Orthopnea. Denies SOB. Appetite ok, still eating frequently at [facility]. No fever or chills. Unable to weight at home. Taking all medications. Has HH RN that comes 2-3x/week.  Drinks 40-60s oz / day. CAD runs in the family. Sister with CHF. Sleeps on 1 pillow.   Past Medical History:  Diagnosis Date   Arthritis    all over (09/04/2013)   Bleeds easily (HCC)    d/t being on Plavix  and ASA per pt   CAD (coronary artery disease) 2015   a. STEMI 2002 s/p stent to PDA. b. Anterior STEMI 06/2013 s/p  asp thrombectomy, DES to prox LAD, EF preserved.   Carotid artery disease (HCC) 10/2013    Total occlusion of the LICA, 60-79^ stenosis of the RICA   Enlarged prostate    GERD (gastroesophageal reflux disease)    takes Pantoprazole  daily   Hard of hearing    no hearing aides   History of colon polyps    benign    History of diverticulitis    History of shingles    Hyperlipidemia    takes Pravastatin  daily   Hypertension    takes Amlodipine  and Metoprolol  daily   Joint pain    Joint swelling    Myocardial infarction [facility]) at age 47 and other 07/05/11   Nocturia    PVD (peripheral vascular disease) with claudication (HCC) 2015   a. 08/2013: s/p diamondback orbital rotational atherectomy and 8 mm x 30 mm long ICast covered stent to calcified ostial right common iliac artery. b. 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   Stroke [facility]) ~ 2012    x 2:right side is weaker   Thin skin    Thrombocytopenia (HCC)    Urinary frequency    Current Outpatient Medications  Medication Sig Dispense Refill   acetaminophen  (TYLENOL ) 500 MG tablet Take 500 mg by mouth every 6 (six) hours as needed for mild pain (pain score 1-3) (or headaches).     amLODipine  (NORVASC ) 10 MG tablet Take 1 tablet (10 mg total) by mouth daily. 30 tablet 0   atorvastatin  (LIPITOR) 20 MG tablet TAKE 1 TABLET(20 MG) BY MOUTH DAILY 90 tablet 2   carvedilol  (COREG ) 12.5 MG tablet Take 1 tablet (12.5 mg total) by mouth 2 (two) times daily with a meal.  60 tablet 0   clopidogrel  (PLAVIX ) 75 MG tablet TAKE 1 TABLET BY MOUTH EVERY DAY 90 tablet 2   feeding supplement (ENSURE PLUS HIGH PROTEIN) LIQD Take 237 mLs by mouth 2 (two) times daily between meals. 14220 mL 0   furosemide  (LASIX ) 40 MG tablet Take 1 tablet (40 mg total) by mouth daily. 30 tablet 1   methylcellulose (ARTIFICIAL TEARS) 1 % ophthalmic solution Place 1 drop into both eyes 3 (three) times daily as needed (for dryness).     pantoprazole  (PROTONIX ) 20 MG tablet TAKE 1 TABLET BY MOUTH TWICE A DAY 180 tablet 1   Phenylephrine -Acetaminophen  5-325 MG CAPS Take 1 capsule by mouth every 8 (eight) hours as needed (for allergic symptoms).     tamsulosin  (FLOMAX ) 0.4 MG CAPS capsule TAKE 1 CAPSULE EACH EVENING 90 capsule 1   traMADol   (ULTRAM ) 50 MG tablet TAKE 1 TABLET BY MOUTH EVERY 8 HOURS AS NEEDED 90 tablet 5   No current facility-administered medications for this encounter.   Allergies  Allergen Reactions   Brilinta  [Ticagrelor ] Shortness Of Breath and Other (See Comments)    VERTIGO, also   Influenza Vaccines Other (See Comments)    Pt states he has been hospitalized both times he was given flu vaccine as a younger adult while in the [employer] and they told him not to take it again   Fluoride Preparations Other (See Comments)    Reaction not recalled   Social History   Socioeconomic History   Marital status: Married    Spouse name: Not on file   Number of children: 3   Years of education: ASSOCIATES   Highest education level: Not on file  Occupational History   Occupation: Retired  Tobacco Use   Smoking status: Former    Current packs/day: 0.50    Average packs/day: 0.5 packs/day for 7.0 years (3.5 ttl pk-yrs)    Types: Cigarettes   Smokeless tobacco: Never  Vaping Use   Vaping status: Never Used  Substance and Sexual Activity   Alcohol  use: No    Alcohol /week: 0.0 standard drinks of alcohol    Drug use: No   Sexual activity: Not Currently  Other Topics Concern   Not on file  Social History Narrative   Patient is married with 3 children.   Patient is right handed.   Patient has his Associates degree.   Patient drinks 2-3 cups daily.   Social Drivers of Corporate investment banker Strain: Low Risk  (10/04/2023)   Overall Financial Resource Strain (CARDIA)    Difficulty of Paying Living Expenses: Not very hard  Food Insecurity: No Food Insecurity (10/07/2023)   Hunger Vital Sign    Worried About Running Out of Food in the Last Year: Never true    Ran Out of Food in the Last Year: Never true  Transportation Needs: No Transportation Needs (10/07/2023)   PRAPARE - Administrator, Civil Service (Medical): No    Lack of Transportation (Non-Medical): No  Physical Activity: Insufficiently  Active (01/07/2022)   Exercise Vital Sign    Days of Exercise per Week: 2 days    Minutes of Exercise per Session: 20 min  Stress: No Stress Concern Present (01/07/2022)   [employer] of Occupational Health - Occupational Stress Questionnaire    Feeling of Stress : Only a little  Social Connections: Moderately Isolated (10/03/2023)   Social Connection and Isolation Panel    Frequency of Communication with Friends and Family: Twice a week  Frequency of Social Gatherings with Friends and Family: Twice a week    Attends Religious Services: Never    Database administrator or Organizations: No    Attends Banker Meetings: Never    Marital Status: Married  Catering manager Violence: Not At Risk (10/07/2023)   Humiliation, Afraid, Rape, and Kick questionnaire    Fear of Current or Ex-Partner: No    Emotionally Abused: No    Physically Abused: No    Sexually Abused: No   Family History  Problem Relation Age of Onset   Diabetes Mother    Heart disease Mother    Hyperlipidemia Mother    Hypertension Mother    Heart disease Father    Hyperlipidemia Father    Hypertension Father    Heart attack Father    Diabetes Sister    Heart disease Sister    Hyperlipidemia Sister    Hypertension Sister    Diabetes Sister    Heart disease Sister    Edema Sister    Cancer Sister    Diabetes Brother    Heart disease Brother        before age 54   Hyperlipidemia Brother    Hypertension Brother    Heart attack Brother    Sleep apnea Brother    Diabetes Brother    Colon cancer Neg Hx    Colon polyps Neg Hx    Vitals:   10/11/23 1151  BP: 134/86  Pulse: (!) 58  SpO2: 94%  Weight: 64.2 kg (141 lb 9.6 oz)  Height: 5' 7 (1.702 m)    PHYSICAL EXAM: General:  elderly appearing.  No respiratory difficulty. Arrived in [facility]. HOH Neck: supple. JVD flat.  Cor: PMI nondisplaced. Regular rate & rhythm. No rubs, gallops or murmurs. Lungs: clear, diminished bases Extremities: no  cyanosis, clubbing, rash, +1 BLE edema  Neuro: alert & oriented x 3. Moves all 4 extremities w/o difficulty. Affect pleasant.   Wt Readings from Last 3 Encounters:  10/11/23 64.2 kg (141 lb 9.6 oz)  10/05/23 63.5 kg (139 lb 15.9 oz)  09/29/23 65.8 kg (145 lb)    ECG: SB 59 bpm (Personally reviewed)    ASSESSMENT & PLAN: Chronic systolic heart failure, iCM - Echo 4/23: EF 25-30% - Echo 9/23: EF 40-45% - Echo 7/6 with LVEF 35-40% (30-35% in 11/2022), RWMA, moderate concentric LVH, G1DD, RV normal. NYHA IIIa on admission, mostly limited by weakness Appears euvolemic on exam.  GDMT  Diuretic-Change lasix  to PRN 20 mg  BB-continue Coreg  12.5 mg twice daily Ace/ARB/ARNI- limited with renal function MRA- Can consider adding if renal function improves SGLT2i - Start Jardiance  10 mg daily. Denies GU symptoms. Labs today, repeat BMET in 7-10 days - Last SCr 1.73 10/05/2023 - Patients EF has fluctuated over the last couple of years but has been staying around the 30-40% range. Not a candidate for advanced therapies given advanced age.  CAD -History of NSTEMI, PDA, LAD PCI -Continue Coreg , Plavix , atorvastatin  -Denies chest pain  Hypertension - BP slightly elevated today - Continue amlodipine  10 mg daily - Continue coreg  12.5 mg BID   Referred to HFSW (PCP, Medications, Transportation, ETOH Abuse, Drug Abuse, Insurance, Financial ):  No Refer to Pharmacy:No Refer to Home Health: has HH ordered Refer to Advanced Heart Failure Clinic: No  Refer to General Cardiology: Patient of Dr. Court  Needs follow up with Dr. Court. Asked his daughter to help him arrange f/u soon.

## 2023-10-04 NOTE — Progress Notes (Signed)
 PROGRESS NOTE    [name] [name]  FMW:[id number] DOB: May 16, 1934 DOA: 10/02/2023 PCP: Alphonsa Glendia LABOR, MD  89 y.o. 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  Back in the ED 7/5 with acute progressive shortness of breath x 2-3 days.  Appears that he was discharged with Lasix  prn for weight gain but had not been using despite weight going up.  Weight has gone from 136 LB -> 144 LB over the past week.   Subjective: -Starting to feel better  Assessment and Plan:  Acute on chronic heart failure with reduced ejection fraction Acute hypoxic respiratory failure  Last TTE 9/' 24 LVEF 30 to 35%, global hypokinesis, severe concentric LVH, grade 2 diastolic dysfunction,  -Repeat echo with EF 35-40% with normal RV, WMA, largely unchanged from prior - Recent admission with AKI, creatinine of 3.9, improved with gentle hydration, stopping Entresto  and holding diuretics, discharged last week on Lasix  PRN which he did not use - Improving with diuresis, continue IV Lasix  1 more day, continue Coreg , hold Entresto  - - borderline candidate for SGLT2i with BPH, possibly add low-dose ARB tomorrow -Unfortunately eats from [employer] or [employer] every day, sausages, hot dogs etc. counseled - Would benefit from close follow-up with TOC   Ground level fall  Generalized weakness, deconditioning  CT head without acute abnormality.  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  Not felt to be candidate for repair due to complexity of endovascular repair and risks of open surgery.  History large B-cell lymphoma s/p chemo/rituximab  in remission: - Noted outpatient surveillance.  CKD3a: Baseline creatinine approximately 1.4     BPH: Continue home tamsulosin    Anemia / thrombocytopenia: Chronic, stable   Chronic pain: tramadol  prn continued    DVT prophylaxis: SCDs with thrombocytopenia Code Status: Full code Family Communication: None present Disposition Plan: Home likely tomorrow  Consultants:    Procedures:   Antimicrobials:    Objective: Vitals:   10/04/23 0500 10/04/23 0600 10/04/23 0708 10/04/23 1145  BP:   (!) 142/96 118/82  Pulse:      Resp: 18 18 (!) 21 17  Temp:   97.8 F (36.6 C) 97.7 F (36.5 C)  TempSrc:   Oral Oral  SpO2:      Weight: 67.1 kg     Height:        Intake/Output Summary (Last 24 hours) at 10/04/2023 1223 Last data filed at 10/04/2023 0640 Gross per 24 hour  Intake 480 ml  Output 2000 ml  Net -1520 ml   Filed Weights   10/03/23 0323 10/04/23 0500  Weight: 68 kg 67.1 kg    Examination:  General exam: Appears calm and comfortable  HEENT: Positive JVD Respiratory system: Few basilar rales Cardiovascular system: S1 & S2 heard, RRR.  Abd: nondistended, soft  and nontender.Normal bowel sounds heard. Central nervous system: Alert and oriented. No focal neurological deficits. Extremities: Trace edema Skin: No rashes Psychiatry:  Mood & affect appropriate.     Data Reviewed:   CBC: Recent Labs  Lab 10/02/23 2247 10/02/23 2250 10/03/23 0051  WBC  --  9.6  --   NEUTROABS  --  8.0*  --   HGB 10.5* 10.6* 11.6*  HCT 31.0* 32.6*  34.0*  MCV  --  91.6  --   PLT  --  108*  --    Basic Metabolic Panel: Recent Labs  Lab 09/29/23 1647 10/02/23 2247 10/02/23 2340 10/03/23 0051 10/03/23 0346 10/04/23 0359  NA 144 135 138 135 136 136  K 5.5* 6.2* 5.1 7.0* 4.6 4.5  CL 110*  --  108 107 104 102  CO2 19*  --  20*  --  21* 24  GLUCOSE 98  --  137* 129* 159* 99  BUN 43*  --  31* 49* 30* 40*  CREATININE 1.49*  --  1.39* 1.40* 1.49* 1.63*  CALCIUM  7.9*  --  8.0*  --  8.1* 8.1*  MG  --   --   --   --  1.7  --   PHOS  --   --   --   --  2.6  --    GFR: Estimated Creatinine Clearance: 30.9 mL/min (A) (by C-G formula based on SCr of 1.63 mg/dL (H)). Liver Function Tests: Recent Labs  Lab 10/02/23 2340  AST 14*  ALT 12  ALKPHOS 62  BILITOT 0.9  PROT 5.9*  ALBUMIN 3.1*   No results for input(s): LIPASE, AMYLASE in the last 168 hours. No results for input(s): AMMONIA in the last 168 hours. Coagulation Profile: No results for input(s): INR, PROTIME in the last 168 hours. Cardiac Enzymes: No results for input(s): CKTOTAL, CKMB, CKMBINDEX, TROPONINI in the last 168 hours. BNP (last 3 results) No results for input(s): PROBNP in the last 8760 hours. HbA1C: No results for input(s): HGBA1C in the last 72 hours. CBG: No results for input(s): GLUCAP in the last 168 hours. Lipid Profile: No results for input(s): CHOL, HDL, LDLCALC, TRIG, CHOLHDL, LDLDIRECT in the last 72 hours. Thyroid Function Tests: No results for input(s): TSH, T4TOTAL, FREET4, T3FREE, THYROIDAB in the last 72 hours. Anemia Panel: No results for input(s): VITAMINB12, FOLATE, FERRITIN, TIBC, IRON, RETICCTPCT in the last 72 hours. Urine analysis:    Component Value Date/Time   COLORURINE YELLOW 10/02/2023 2239   APPEARANCEUR HAZY (A) 10/02/2023 2239   LABSPEC 1.006 10/02/2023 2239   PHURINE 5.0 10/02/2023 2239   GLUCOSEU NEGATIVE 10/02/2023 2239   HGBUR NEGATIVE 10/02/2023 2239    BILIRUBINUR NEGATIVE 10/02/2023 2239   KETONESUR NEGATIVE 10/02/2023 2239   PROTEINUR NEGATIVE 10/02/2023 2239   NITRITE NEGATIVE 10/02/2023 2239   LEUKOCYTESUR NEGATIVE 10/02/2023 2239   Sepsis Labs: @LABRCNTIP (procalcitonin:4,lacticidven:4)  ) Recent Results (from the past 240 hours)  Resp panel by RT-PCR (RSV, Flu A&B, Covid) Anterior Nasal Swab     Status: None   Collection Time: 10/02/23 10:39 PM   Specimen: Anterior Nasal Swab  Result Value Ref Range Status   SARS Coronavirus 2 by RT PCR NEGATIVE NEGATIVE Final   Influenza A by PCR NEGATIVE NEGATIVE Final   Influenza B by PCR NEGATIVE NEGATIVE Final    Comment: (NOTE) The Xpert Xpress SARS-CoV-2/FLU/RSV plus assay is intended as an aid in the diagnosis of influenza from Nasopharyngeal swab specimens and should not be used  as a sole basis for treatment. Nasal washings and aspirates are unacceptable for Xpert Xpress SARS-CoV-2/FLU/RSV testing.  Fact Sheet for Patients: BloggerCourse.com  Fact Sheet for Healthcare Providers: SeriousBroker.it  This test is not yet approved or cleared by the [place]  FDA and has been authorized for detection and/or diagnosis of SARS-CoV-2 by FDA under an Emergency Use Authorization (EUA). This EUA will remain in effect (meaning this test can be used) for the duration of the COVID-19 declaration under Section 564(b)(1) of the Act, 21 U.S.C. section 360bbb-3(b)(1), unless the authorization is terminated or revoked.     Resp Syncytial Virus by PCR NEGATIVE NEGATIVE Final    Comment: (NOTE) Fact Sheet for Patients: BloggerCourse.com  Fact Sheet for Healthcare Providers: SeriousBroker.it  This test is not yet approved or cleared by the [place]  FDA and has been authorized for detection and/or diagnosis of SARS-CoV-2 by FDA under an Emergency Use Authorization (EUA). This EUA will  remain in effect (meaning this test can be used) for the duration of the COVID-19 declaration under Section 564(b)(1) of the Act, 21 U.S.C. section 360bbb-3(b)(1), unless the authorization is terminated or revoked.  Performed at [facility], [address] [place], [place] [address]      Radiology Studies: ECHOCARDIOGRAM COMPLETE Result Date: 10/03/2023    ECHOCARDIOGRAM REPORT   Patient Name:   [name] [name] Date of Exam: 10/03/2023 Medical Rec #:  [id number]         Height:       67.7 in Accession #:    [id number]        Weight:       149.9 lb Date of Birth:  06/09/1934        BSA:          1.803 m Patient Age:    88 years          BP:           152/73 mmHg Patient Gender: M                 HR:           72 bpm. Exam Location:  Inpatient Procedure: 2D Echo, Color Doppler and Cardiac Doppler (Both Spectral and Color            Flow Doppler were utilized during procedure). Indications:    I50.9* Heart failure (unspecified)  History:        Patient has prior history of Echocardiogram examinations, most                 recent 12/10/2022. CHF, CAD; Risk Factors:Hypertension and                 Dyslipidemia.  Sonographer:    Damien Senior RDCS Referring Phys: [id number] DORN DAWSON IMPRESSIONS  1. Left ventricular ejection fraction, by estimation, is 35 to 40%. Left ventricular ejection fraction by 2D MOD biplane is 37.9 %. The left ventricle has moderately decreased function. The left ventricle demonstrates regional wall motion abnormalities (see scoring diagram/findings for description). There is moderate concentric left ventricular hypertrophy. Left ventricular diastolic parameters are consistent with Grade I diastolic dysfunction (impaired relaxation).  2. Right ventricular systolic function is normal. The right ventricular size is normal. There is moderately elevated pulmonary artery systolic pressure. The estimated right ventricular systolic pressure is 51.8 mmHg.  3. Left atrial size was  severely dilated.  4. A small pericardial effusion is present. The pericardial effusion is posterior to the left  ventricle and anterior to the right ventricle. There is no evidence of cardiac tamponade.  5. The mitral valve is degenerative. Trivial mitral valve regurgitation.  6. The aortic valve is tricuspid. There is mild calcification of the aortic valve. Aortic valve regurgitation is mild. Aortic valve sclerosis/calcification is present, without any evidence of aortic stenosis.  7. The inferior vena cava is normal in size with <50% respiratory variability, suggesting right atrial pressure of 8 mmHg. Comparison(s): Prior images reviewed side by side. LVEF 35-40% range. Moderately elevated RVSP. Small pericardial effusion. FINDINGS  Left Ventricle: Left ventricular ejection fraction, by estimation, is 35 to 40%. Left ventricular ejection fraction by 2D MOD biplane is 37.9 %. The left ventricle has moderately decreased function. The left ventricle demonstrates regional wall motion abnormalities. The left ventricular internal cavity size was normal in size. There is moderate concentric left ventricular hypertrophy. Left ventricular diastolic parameters are consistent with Grade I diastolic dysfunction (impaired relaxation).  LV Wall Scoring: The basal inferior segment is akinetic. The mid and distal lateral wall, entire septum, mid and distal inferior wall, and apex are hypokinetic. The entire anterior wall, antero-lateral wall, and basal inferolateral segment are normal. Right Ventricle: The right ventricular size is normal. No increase in right ventricular wall thickness. Right ventricular systolic function is normal. There is moderately elevated pulmonary artery systolic pressure. The tricuspid regurgitant velocity is 3.31 m/s, and with an assumed right atrial pressure of 8 mmHg, the estimated right ventricular systolic pressure is 51.8 mmHg. Left Atrium: Left atrial size was severely dilated. Right Atrium: Right  atrial size was normal in size. Pericardium: A small pericardial effusion is present. The pericardial effusion is posterior to the left ventricle and anterior to the right ventricle. There is no evidence of cardiac tamponade. Mitral Valve: The mitral valve is degenerative in appearance. There is mild thickening of the mitral valve leaflet(s). Mild mitral annular calcification. Trivial mitral valve regurgitation. MV peak gradient, 9.4 mmHg. The mean mitral valve gradient is 4.0 mmHg. Tricuspid Valve: The tricuspid valve is grossly normal. Tricuspid valve regurgitation is mild. Aortic Valve: The aortic valve is tricuspid. There is mild calcification of the aortic valve. There is mild aortic valve annular calcification. Aortic valve regurgitation is mild. Aortic valve sclerosis/calcification is present, without any evidence of aortic stenosis. Pulmonic Valve: The pulmonic valve was grossly normal. Pulmonic valve regurgitation is trivial. Aorta: The aortic root and ascending aorta are structurally normal, with no evidence of dilitation. Venous: The inferior vena cava is normal in size with less than 50% respiratory variability, suggesting right atrial pressure of 8 mmHg. IAS/Shunts: No atrial level shunt detected by color flow Doppler. Additional Comments: 3D was performed not requiring image post processing on an independent workstation and was indeterminate.  LEFT VENTRICLE PLAX 2D                        Biplane EF (MOD) LVIDd:         4.00 cm         LV Biplane EF:   Left LVIDs:         3.10 cm                          ventricular LV PW:         1.50 cm  ejection LV IVS:        1.50 cm                          fraction by LVOT diam:     2.10 cm                          2D MOD LV SV:         73                               biplane is LV SV Index:   41                               37.9 %. LVOT Area:     3.46 cm                                Diastology                                LV e'  medial:    3.92 cm/s LV Volumes (MOD)               LV E/e' medial:  23.4 LV vol d, MOD    136.0 ml      LV e' lateral:   8.49 cm/s A2C:                           LV E/e' lateral: 10.8 LV vol d, MOD    125.0 ml A4C: LV vol s, MOD    86.2 ml A2C: LV vol s, MOD    79.1 ml A4C: LV SV MOD A2C:   49.8 ml LV SV MOD A4C:   125.0 ml LV SV MOD BP:    52.7 ml RIGHT VENTRICLE RV S prime:     14.10 cm/s TAPSE (M-mode): 1.7 cm LEFT ATRIUM              Index        RIGHT ATRIUM           Index LA diam:        4.50 cm  2.50 cm/m   RA Area:     17.40 cm LA Vol (A2C):   58.0 ml  32.17 ml/m  RA Volume:   43.40 ml  24.07 ml/m LA Vol (A4C):   123.0 ml 68.23 ml/m LA Biplane Vol: 88.3 ml  48.98 ml/m  AORTIC VALVE LVOT Vmax:   88.60 cm/s LVOT Vmean:  68.600 cm/s LVOT VTI:    0.212 m  AORTA Ao Root diam: 3.30 cm Ao Asc diam:  3.00 cm MITRAL VALVE                TRICUSPID VALVE MV Area (PHT): 3.61 cm     TR Peak grad:   43.8 mmHg MV Area VTI:   3.29 cm     TR Vmax:        331.00 cm/s MV Peak grad:  9.4 mmHg MV Mean grad:  4.0 mmHg     SHUNTS MV Vmax:       1.53 m/s     Systemic VTI:  0.21 m MV Vmean:      87.4 cm/s    Systemic Diam: 2.10 cm MV Decel Time: 210 msec MV E velocity: 91.70 cm/s MV A velocity: 143.00 cm/s MV E/A ratio:  0.64 Jayson Sierras MD Electronically signed by Jayson Sierras MD Signature Date/Time: 10/03/2023/12:29:16 PM    Final    CT Head Wo Contrast Result Date: 10/02/2023 CLINICAL DATA:  Fall, on Plavix  EXAM: CT HEAD WITHOUT CONTRAST TECHNIQUE: Contiguous axial images were obtained from the base of the skull through the vertex without intravenous contrast. RADIATION DOSE REDUCTION: This exam was performed according to the departmental dose-optimization program which includes automated exposure control, adjustment of the mA and/or kV according to patient size and/or use of iterative reconstruction technique. COMPARISON:  03/01/2022 FINDINGS: Brain: There is atrophy and chronic small vessel disease changes. No  acute intracranial abnormality. Specifically, no hemorrhage, hydrocephalus, mass lesion, acute infarction, or significant intracranial injury. Vascular: No hyperdense vessel or unexpected calcification. Skull: No acute calvarial abnormality. Sinuses/Orbits: No acute findings Other: Left frontal scalp nodule is again noted, measuring 2 cm, favored to represent sebaceous cyst. IMPRESSION: Atrophy, chronic microvascular disease. No acute intracranial abnormality. Electronically Signed   By: Franky Crease M.D.   On: 10/02/2023 23:13   DG Chest Port 1 View Result Date: 10/02/2023 CLINICAL DATA:  SOB EXAM: PORTABLE CHEST 1 VIEW COMPARISON:  CT chest 06/09/2023, chest x-ray 03/01/2022 FINDINGS: The heart and mediastinal contours are unchanged. Atherosclerotic plaque. No focal consolidation. Mild pulmonary edema. No pleural effusion. No pneumothorax. No acute osseous abnormality. Bilateral shoulder degenerative changes. IMPRESSION: 1. Mild pulmonary edema. 2.  Aortic Atherosclerosis (ICD10-I70.0). Electronically Signed   By: Morgane  Naveau M.D.   On: 10/02/2023 22:52     Scheduled Meds:  amLODipine   10 mg Oral Daily   atorvastatin   20 mg Oral Daily   carvedilol   12.5 mg Oral BID WC   clopidogrel   75 mg Oral Daily   furosemide   40 mg Intravenous BID   pantoprazole   20 mg Oral BID   sodium chloride  flush  3 mL Intravenous Q12H   tamsulosin   0.4 mg Oral QPC supper   Continuous Infusions:   LOS: 1 day    Time spent:    Sigurd Pac, MD [facility]   10/04/2023, 12:23 PM

## 2023-10-04 NOTE — Progress Notes (Signed)
 Heart Failure Nurse Navigator Progress Note  PCP: Alphonsa Glendia LABOR, MD PCP-Cardiologist: None Admission Diagnosis: Acute on Chronic combined systolic and diastolic congestive heart failure.  Admitted from: Home  Via EMS.   Presentation:   [name] [name] presented with shortness of breath, saturations in the 70's with EMS arrival, EMS gave him 50 mg of albuterol , 1 of Atrovent , 125 Solu-Medrol and 2 g of mag with improvement in his work of breathing. reported that patient had a fall earlier that morning, getting off his scale, resulting in hitting his head on a dresser. No LOC. BP 187/83, HR 73, BNP 1,989, CXR mild pulmonary edema, CT Head wo acute abnormality. Some confusion per patient with taking his PRN lasix .   Patient and son were educated on the sign and symptoms of heart failure, daily weights, when to call his doctor or go tot he ED. Diet/ fluid restrictions, patient reports to eating take out 3-4 times per week , including drinking either a Coke or Mt. Dew) , continued education on taking all medications as prescribed and attending all medical appointments. Patient is HOH and wears a hearing aide, he does hear better on his left side. Patient and son verbalized their understanding of all education, a HF TOC appointment was scheduled for 10/11/2023 @ 11 :45 am.     ECHO/ LVEF: 35-40%  Clinical Course:  Past Medical History:  Diagnosis Date   Arthritis    all over (09/04/2013)   Bleeds easily (HCC)    d/t being on Plavix  and ASA per pt   CAD (coronary artery disease) 2015   a. STEMI 2002 s/p stent to PDA. b. Anterior STEMI 06/2013 s/p  asp thrombectomy, DES to prox LAD, EF preserved.   Carotid artery disease (HCC) 10/2013    Total occlusion of the LICA, 60-79^ stenosis of the RICA   Enlarged prostate    GERD (gastroesophageal reflux disease)    takes Pantoprazole  daily   Hard of hearing    no hearing aides   History of colon polyps    benign   History of diverticulitis     History of shingles    Hyperlipidemia    takes Pravastatin  daily   Hypertension    takes Amlodipine  and Metoprolol  daily   Joint pain    Joint swelling    Myocardial infarction [facility]) at age 87 and other 07/05/11   Nocturia    PVD (peripheral vascular disease) with claudication (HCC) 2015   a. 08/2013: s/p diamondback orbital rotational atherectomy and 8 mm x 30 mm long ICast covered stent to calcified ostial right common iliac artery. b. 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   Stroke [facility]) ~ 2012    x 2:right side is weaker   Thin skin    Thrombocytopenia (HCC)    Urinary frequency      Social History   Socioeconomic History   Marital status: Married    Spouse name: Not on file   Number of children: 3   Years of education: ASSOCIATES   Highest education level: Not on file  Occupational History   Occupation: Retired  Tobacco Use   Smoking status: Former    Current packs/day: 0.50    Average packs/day: 0.5 packs/day for 7.0 years (3.5 ttl pk-yrs)    Types: Cigarettes   Smokeless tobacco: Never  Vaping Use   Vaping status: Never Used  Substance and Sexual Activity   Alcohol  use: No  Alcohol /week: 0.0 standard drinks of alcohol    Drug use: No   Sexual activity: Not Currently  Other Topics Concern   Not on file  Social History Narrative   Patient is married with 3 children.   Patient is right handed.   Patient has his Associates degree.   Patient drinks 2-3 cups daily.   Social Drivers of Corporate investment banker Strain: Low Risk  (01/07/2022)   Overall Financial Resource Strain (CARDIA)    Difficulty of Paying Living Expenses: Not hard at all  Food Insecurity: No Food Insecurity (10/03/2023)   Hunger Vital Sign    Worried About Running Out of Food in the Last Year: Never true    Ran Out of Food in the Last Year: Never true  Transportation Needs: No Transportation Needs (10/03/2023)   PRAPARE - Therapist, art (Medical): No    Lack of Transportation (Non-Medical): No  Physical Activity: Insufficiently Active (01/07/2022)   Exercise Vital Sign    Days of Exercise per Week: 2 days    Minutes of Exercise per Session: 20 min  Stress: No Stress Concern Present (01/07/2022)   [employer] of Occupational Health - Occupational Stress Questionnaire    Feeling of Stress : Only a little  Social Connections: Moderately Isolated (10/03/2023)   Social Connection and Isolation Panel    Frequency of Communication with Friends and Family: Twice a week    Frequency of Social Gatherings with Friends and Family: Twice a week    Attends Religious Services: Never    Database administrator or Organizations: No    Attends Engineer, structural: Never    Marital Status: Married   Water engineer and Provision:  Detailed education and instructions provided on heart failure disease management including the following:  Signs and symptoms of Heart Failure When to call the physician Importance of daily weights Low sodium diet Fluid restriction Medication management Anticipated future follow-up appointments  Patient education given on each of the above topics.  Patient acknowledges understanding via teach back method and acceptance of all instructions.  Education Materials:  Living Better With Heart Failure Booklet, HF zone tool, & Daily Weight Tracker Tool.  Patient has scale at home: Yes Patient has pill box at home: Yes    High Risk Criteria for Readmission and/or Poor Patient Outcomes: Heart failure hospital admissions (last 6 months): 1  No Show rate: 1 %  Difficult social situation: No, lives with his wife and son and daughter check in on them.  Demonstrates medication adherence: Yes,  Primary Language: English Literacy level: Reading, writing, and comprehension. Patient is HOH, has a hearing aide, hears better on the left side.   Barriers of Care:   Diet  and fluid restrictions ( eats take out 3-4 times per week, drinks soda, coke and Mt. Dew)  Daily weights  Considerations/Referrals:   Referral made to Heart Failure Pharmacist Stewardship: Yes Referral made to Heart Failure CSW/NCM TOC: No Referral made to Heart & Vascular TOC clinic: Yes, 10/11/2023 @ 11:45 am.   Items for Follow-up on DC/TOC: Continued HF education Diet/ fluid restrictions/ daily weights   Stephane Haddock, BSN, RN Heart Failure Teacher, adult education Only

## 2023-10-04 NOTE — Progress Notes (Signed)
   Heart Failure Stewardship Pharmacist Progress Note   PCP: Alphonsa Glendia LABOR, MD PCP-Cardiologist: Dorn Lesches, MD    HPI:  89 yo M with PMH of CHF, CAD, B cell lymphoma, AAA, HTN, HLD, and BPH.   Recently admitted 6/26-6/29 with acute renal failure, was not eating well for a few weeks. Renal CT with no acute localizing process in the abdomen or pelvis. Stable 6,3 cm infrarenal abdominal aortic aneurysm. Entresto  was stopped and he was started on amlodipine  for HTN control. PRN lasix  added for discharge.   Presented back to the ED on 7/5 with shortness of breath and weight gain. Was not taking his lasix  due to confusion with PRN instructions. CXR with mild pulmonary edema. Started on IV lasix . ECHO 7/6 with LVEF 35-40% (30-35% in 11/2022), RWMA, moderate concentric LVH, G1DD, RV normal.   Met with patient and his son at bedside. He is hard of hearing and wears a hearing aid. Says he is just feeling ok. Reports breathing is getting better. No LE edema. His son reports that the patient's daughter usually helps him fill the pill box for him for the week and picks up his medications from the pharmacy. They are concerned about his medication management going home if his kidney function is still down. Will need clear instructions on likely daily dosing of lasix  since PRN dosing was difficult for him to understand.   Current HF Medications: Diuretic: furosemide  40 mg IV BID Beta Blocker: carvedilol  12.5 mg BID  Prior to admission HF Medications: Diuretic: furosemide  20 mg daily PRN Beta blocker: carvedilol  12.5 mg BID ACE/ARB/ARNI: Entresto  49/51 mg BID - stopped recently due to elevated BUN  Pertinent Lab Values: Serum creatinine 1.63, BUN 40, Potassium 4.5, Sodium 136, BNP 1989, Magnesium  1.7 (7/6)  Vital Signs: Weight: 147 lbs (admission weight: 149 lbs) Blood pressure: 130-140/90s  Heart rate: 70s  I/O: net -1L yesterday; net -1.8L since admission  Medication Assistance / Insurance  Benefits Check: Does the patient have prescription insurance?  Yes Type of insurance plan: [employer]  Outpatient Pharmacy:  [facility] the patient willing to use [facility] TOC pharmacy at discharge? Yes Is the patient willing to transition their outpatient pharmacy to utilize a [facility] outpatient pharmacy?   No    Assessment: 1. Acute on chronic systolic CHF (LVEF 35-40%), due to ICM. NYHA class III symptoms. - Continue furosemide  40 mg IV BID. Strict I/Os and daily weights. Keep K>4 and Mg>2 - Continue carvedilol  12.5 mg BID - Holding Entresto  with AKI - was stopped at last hospitalization due to acute renal failure. Creatinine has bumped again today from 1.49>1.63. - No MRA or SGLT2i with AKI as well   Plan: 1) Medication changes recommended at this time: - Recheck magnesium  in AM - Recommend daily dosing of lasix  vs PRN at discharge since he gets confused with PRN dosing  2) Patient assistance: - None pending  3)  Education  - Initial education completed - Full education to be completed prior to discharge  Duwaine Plant, PharmD, BCPS Heart Failure Engineer, building services Phone [phone]

## 2023-10-04 NOTE — Plan of Care (Signed)
   Problem: Nutrition: Goal: Adequate nutrition will be maintained Outcome: Progressing   Problem: Coping: Goal: Level of anxiety will decrease Outcome: Progressing   Problem: Safety: Goal: Ability to remain free from injury will improve Outcome: Progressing

## 2023-10-04 NOTE — Plan of Care (Signed)
   Problem: Education: Goal: Knowledge of General Education information will improve Description: Including pain rating scale, medication(s)/side effects and non-pharmacologic comfort measures Outcome: Progressing   Problem: Nutrition: Goal: Adequate nutrition will be maintained Outcome: Progressing   Problem: Coping: Goal: Level of anxiety will decrease Outcome: Progressing

## 2023-10-04 NOTE — TOC Initial Note (Signed)
 Transition of Care [facility]) - Initial/Assessment Note    Patient Details  Name: [name] [name] MRN: [id number] Date of Birth: [birth year]  Transition of Care [facility]) CM/SW Contact:    Luise JAYSON Pan, LCSWA Phone Number: 10/04/2023, 5:05 PM  Clinical Narrative:    Per daily meeting, Patient wants to go home with daughter. NCM notified by MD to set up [facility].   TOC will continue to follow.        Expected Discharge Plan: Home w Home Health Services Barriers to Discharge: Continued Medical Work up   Patient Goals and CMS Choice            Expected Discharge Plan and Services                                              Prior Living Arrangements/Services                       Activities of Daily Living   ADL Screening (condition at time of admission) Independently performs ADLs?: No Does the patient have a NEW difficulty with bathing/dressing/toileting/self-feeding that is expected to last >3 days?: Yes (Initiates electronic notice to provider for possible OT consult) Does the patient have a NEW difficulty with getting in/out of bed, walking, or climbing stairs that is expected to last >3 days?: Yes (Initiates electronic notice to provider for possible PT consult) Does the patient have a NEW difficulty with communication that is expected to last >3 days?: No Is the patient deaf or have difficulty hearing?: Yes Does the patient have difficulty seeing, even when wearing glasses/contacts?: No Does the patient have difficulty concentrating, remembering, or making decisions?: No  Permission Sought/Granted                  Emotional Assessment              Admission diagnosis:  Acute exacerbation of CHF (congestive heart failure) (HCC) [I50.9] Acute on chronic combined systolic and diastolic congestive heart failure (HCC) [I50.43] Patient Active Problem List   Diagnosis Date Noted   Acute exacerbation of CHF (congestive heart failure) (HCC)  10/03/2023   Acute hypoxic respiratory failure (HCC) 10/03/2023   Ground-level fall 10/03/2023   ARF (acute renal failure) (HCC) 09/24/2023   Essential hypertension 09/24/2023   Chronic systolic CHF (congestive heart failure) (HCC) 09/24/2023   Renal failure 09/24/2023   Moderately severe major depression (HCC) 05/24/2023   CKD stage 3b, GFR 30-44 ml/min (HCC) 06/19/2022   History of heart artery stent 01/07/2022   Knee pain 01/07/2022   Hearing loss 01/07/2022   Benign essential hypertension 01/07/2022   Generalized weakness 09/26/2021   UTI (urinary tract infection) 09/26/2021   GERD (gastroesophageal reflux disease)    Acute cystitis    Sepsis (HCC) 09/25/2021   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   Acute blood loss anemia    GI bleed 09/16/2020   Diverticulosis 09/16/2020   Rectal bleeding    Anemia    Stenosis of left subclavian artery (HCC) 08/22/2020   CHF (congestive heart failure) (HCC) 08/15/2020   Hypertensive urgency 02/20/2020   Chronic combined systolic and diastolic heart failure (HCC) 02/19/2020   Carpal tunnel syndrome of right wrist 04/12/2018   Thrombocytopenia (HCC) 03/21/2018   Aftercare 03/08/2018   Ulnar neuropathy of left upper extremity 01/24/2018   Ulnar neuropathy  of right upper extremity 01/24/2018   Bilateral carpal tunnel syndrome 01/24/2018   Bilateral shoulder pain 01/11/2018   Cervical spine pain 01/11/2018   Pain in left knee 01/11/2018   Pain in thoracic spine 01/11/2018   Asymptomatic carotid artery stenosis 11/27/2015   Carpal tunnel syndrome 06/12/2015   Special screening for malignant neoplasms, colon 11/08/2014   Encounter for long-term (current) use of antiplatelets/antithrombotics 11/08/2014   Anemia, iron deficiency 06/04/2014   Cerebral infarction due to stenosis of right carotid artery (HCC) 04/12/2014   Carotid occlusion, left 04/12/2014   PVD (peripheral vascular disease) (HCC) 04/12/2014   Coronary artery disease involving  native coronary artery of native heart with angina pectoris (HCC) 04/12/2014   B12 deficiency 04/12/2014   Cerebral infarction [facility])    Cerebral infarction (HCC) 03/26/2014   Stroke (HCC) 03/26/2014   BPH (benign prostatic hyperplasia) 11/28/2013   Claudication (HCC) 09/04/2013   Pre-op exam 08/17/2013   Dyspnea 08/17/2013   CAD S/P percutaneous coronary angioplasty 07/25/2013   AAA (abdominal aortic aneurysm) (HCC) 07/25/2013   STEMI (ST elevation myocardial infarction) (HCC) 07/02/2013   Peripheral vascular disease (HCC) 07/02/2013   Occlusion and stenosis of carotid artery with cerebral infarction 10/25/2012   Hyperlipidemia 09/06/2012   Hypertension 09/06/2012   Arthritis 09/06/2012   History of stroke 09/06/2012   PCP:  Alphonsa Glendia LABOR, MD Pharmacy:   [phone] [place],  - [address] [place] [address] Phone: [phone] Fax: [phone]  [facility] [address] [place] [address] Phone: [phone] Fax: [phone]     Social Drivers of Health (SDOH) Social History: SDOH Screenings   Food Insecurity: No Food Insecurity (10/03/2023)  Housing: Unknown (10/04/2023)  Recent Concern: Housing - High Risk (10/03/2023)  Transportation Needs: No Transportation Needs (10/04/2023)  Utilities: Not At Risk (10/03/2023)  Alcohol  Screen: Low Risk  (10/04/2023)  Depression (PHQ2-9): High Risk (09/22/2023)  Financial Resource Strain: Low Risk  (10/04/2023)  Physical Activity: Insufficiently Active (01/07/2022)  Social Connections: Moderately Isolated (10/03/2023)  Stress: No Stress Concern Present (01/07/2022)  Tobacco Use: Medium Risk (10/02/2023)   SDOH Interventions: Housing Interventions: Intervention Not Indicated Transportation Interventions: Intervention Not Indicated Alcohol  Usage Interventions: Intervention Not Indicated (Score <7) Financial Strain Interventions:  Intervention Not Indicated   Readmission Risk Interventions    09/29/2021   11:16 AM  Readmission Risk Prevention Plan  Transportation Screening Complete  PCP or Specialist Appt within 5-7 Days Complete  Home Care Screening Complete  Medication Review (RN CM) Complete

## 2023-10-05 ENCOUNTER — Ambulatory Visit: Payer: Self-pay

## 2023-10-05 ENCOUNTER — Other Ambulatory Visit (HOSPITAL_COMMUNITY): Payer: Self-pay

## 2023-10-05 DIAGNOSIS — I5043 Acute on chronic combined systolic (congestive) and diastolic (congestive) heart failure: Secondary | ICD-10-CM | POA: Diagnosis not present

## 2023-10-05 LAB — BASIC METABOLIC PANEL WITH GFR
Anion gap: 9 (ref 5–15)
BUN: 45 mg/dL — ABNORMAL HIGH (ref 8–23)
CO2: 28 mmol/L (ref 22–32)
Calcium: 8.1 mg/dL — ABNORMAL LOW (ref 8.9–10.3)
Chloride: 101 mmol/L (ref 98–111)
Creatinine, Ser: 1.73 mg/dL — ABNORMAL HIGH (ref 0.61–1.24)
GFR, Estimated: 38 mL/min — ABNORMAL LOW (ref >60)
Glucose, Bld: 93 mg/dL (ref 70–99)
Potassium: 4.1 mmol/L (ref 3.5–5.1)
Sodium: 138 mmol/L (ref 135–145)

## 2023-10-05 LAB — MAGNESIUM: Magnesium: 1.6 mg/dL — ABNORMAL LOW (ref 1.7–2.4)

## 2023-10-05 MED ORDER — MAGNESIUM SULFATE 2 GM/50ML IV SOLN: 2.0000 g | Freq: Once | INTRAVENOUS | Status: DC

## 2023-10-05 MED ORDER — FUROSEMIDE 40 MG PO TABS
40.0000 mg | ORAL_TABLET | Freq: Every day | ORAL | 1 refills | Status: DC
  Filled 2023-10-05: qty 30, 30d supply, fill #0

## 2023-10-05 MED ORDER — MAGNESIUM OXIDE -MG SUPPLEMENT 400 (240 MG) MG PO TABS
800.0000 mg | ORAL_TABLET | Freq: Once | ORAL | Status: AC
  Administered 2023-10-05: 800 mg via ORAL
  Filled 2023-10-05: qty 2

## 2023-10-05 NOTE — Progress Notes (Signed)
 Physical Therapy Treatment Patient Details Name: [name] [name] MRN: [id number] DOB: [birth year] Today's Date: 10/05/2023   History of Present Illness The pt is an 89 yo male presenting 7/5 with SOB, hypoxic to 70s upon arrival, fall at 10:00AM without injury. PMH includes: admission 6/27-6/29 with elevated creatinine and admitted for HTN and acute renal failure, CHF, HTN, CAD, PVD, STEMI x2, CVA x2.    PT Comments  Pt making great progress with mobility this morning, but remains limited by poor power in LE to rise without use of UE, limited balance, and endurance. He benefits from CGA with use of RW to ambulate ~35 ft max before reporting weakness. Pt then encouraged to return to room for seated rest, suspect poor initiation of energy conservation strategies without cues/supervision. Pt also with SpO2 from 97% at rest to 91% with exertion and back to 99% after seated rest. BP elevated but stable with all mobility. Pt reports son available for assistance 24/7, will benefit from max HHPT to progress endurance and strength after d/c.     If plan is discharge home, recommend the following: Assistance with cooking/housework;Assistance with feeding;Direct supervision/assist for medications management;Direct supervision/assist for financial management;Assist for transportation;Help with stairs or ramp for entrance;A little help with walking and/or transfers;A little help with bathing/dressing/bathroom   Can travel by private vehicle     Yes  Equipment Recommendations  None recommended by PT    Recommendations for Other Services       Precautions / Restrictions Precautions Precautions: Fall Recall of Precautions/Restrictions: Intact Restrictions Weight Bearing Restrictions Per Provider Order: No     Mobility  Bed Mobility               General bed mobility comments: OOB at start and end of session    Transfers Overall transfer level: Needs assistance Equipment used: Rolling  walker (2 wheels) Transfers: Bed to chair/wheelchair/BSC, Sit to/from Stand Sit to Stand: Contact guard assist   Step pivot transfers: Min assist       General transfer comment: CGA to rise, minA to steady with turn to chair    Ambulation/Gait Ambulation/Gait assistance: Contact guard assist Gait Distance (Feet): 35 Feet Assistive device: Rolling walker (2 wheels) Gait Pattern/deviations: Decreased stride length, Knees buckling, Step-through pattern, Decreased weight shift to left, Antalgic Gait velocity: reduced Gait velocity interpretation: <1.31 ft/sec, indicative of household ambulator   General Gait Details: pt with limited wt on LLE, reports antalgic gait due to sore back from fall. BP stable, SpO2 to low 91% on RA   Stairs             Wheelchair Mobility     Tilt Bed    Modified Rankin (Stroke Patients Only)       Balance Overall balance assessment: Needs assistance Sitting-balance support: No upper extremity supported, Feet supported Sitting balance-Leahy Scale: Fair Sitting balance - Comments: seated EOB   Standing balance support: Bilateral upper extremity supported, During functional activity, Reliant on assistive device for balance Standing balance-Leahy Scale: Poor Standing balance comment: reliant on external support                            Communication Communication Communication: Impaired Factors Affecting Communication: Hearing impaired  Cognition Arousal: Alert Behavior During Therapy: WFL for tasks assessed/performed   PT - Cognitive impairments: No apparent impairments  Following commands: Intact      Cueing Cueing Techniques: Verbal cues, Visual cues  Exercises      General Comments General comments (skin integrity, edema, etc.): SpO2 from 97% at rest to 91% with exertion and back to 99% after gait. BP elevated but stable      Pertinent Vitals/Pain Pain Assessment Pain  Assessment: No/denies pain     PT Goals (current goals can now be found in the care plan section) Acute Rehab PT Goals Patient Stated Goal: to go home when stronger PT Goal Formulation: With patient Time For Goal Achievement: 10/17/23 Potential to Achieve Goals: Good Progress towards PT goals: Progressing toward goals    Frequency    Min 2X/week       AM-PAC PT 6 Clicks Mobility   Outcome Measure  Help needed turning from your back to your side while in a flat bed without using bedrails?: A Little Help needed moving from lying on your back to sitting on the side of a flat bed without using bedrails?: A Little Help needed moving to and from a bed to a chair (including a wheelchair)?: A Little Help needed standing up from a chair using your arms (e.g., wheelchair or bedside chair)?: A Little Help needed to walk in hospital room?: A Little Help needed climbing 3-5 steps with a railing? : A Lot 6 Click Score: 17    End of Session Equipment Utilized During Treatment: Gait belt Activity Tolerance: Patient tolerated treatment well Patient left: in chair;with call bell/phone within reach;with chair alarm set Nurse Communication: Mobility status PT Visit Diagnosis: Unsteadiness on feet (R26.81);Muscle weakness (generalized) (M62.81)     Time: 9068-9054 PT Time Calculation (min) (ACUTE ONLY): 14 min  Charges:    $Therapeutic Exercise: 8-22 mins PT General Charges $$ ACUTE PT VISIT: 1 Visit                     Izetta Call, PT, DPT   Acute Rehabilitation Department Office [phone] Secure Chat Communication Preferred   Izetta JULIANNA Call 10/05/2023, 9:49 AM

## 2023-10-05 NOTE — TOC Initial Note (Signed)
 Transition of Care [facility]) - Initial/Assessment Note    Patient Details  Name: [name] [name] MRN: [id number] Date of Birth: [birth year]  Transition of Care [facility]) CM/SW Contact:    Waddell Barnie Rama, RN Phone Number: 10/05/2023, 10:09 AM  Clinical Narrative:                 From home with spouse, has PCP and insurance on file, states for NCM to contact son or daughter,  NCM called son no answer, called daughter, she states he is active with [facility],  he has rolling walker , shower seat and cane .  He does not need bsc per daughter.    States family member will transport them home at [employer] and family is support system,   Pta self ambulatory with walker. Patient gives this NCM permission to speak with son,daughter.  NCM confirmed with [facility] he is active with them.  He would like to continue with them per daughter.  Soc will begin 24 to 48 hrs post dc.   Expected Discharge Plan: Home w Home Health Services Barriers to Discharge: No Barriers Identified   Patient Goals and CMS Choice Patient states their goals for this hospitalization and ongoing recovery are:: return home CMS Medicare.gov Compare Post Acute Care list provided to:: Patient Represenative (must comment) Choice offered to / list presented to : Adult Children      Expected Discharge Plan and Services   Discharge Planning Services: CM Consult Post Acute Care Choice: Home Health Living arrangements for the past 2 months: Single Family Home Expected Discharge Date: 10/05/23                 DME Agency: NA       HH Arranged: RN, PT, OT HH Agency: [facility] Date [facility] Agency Contacted: 10/05/23 Time HH Agency Contacted: 1004 Representative spoke with at [facility] Agency: [facility]  Prior Living Arrangements/Services Living arrangements for the past 2 months: Single Family Home Lives with:: Spouse Patient language and need for interpreter reviewed:: Yes Do you feel safe going back to the place where you live?: Yes       Need for Family Participation in Patient Care: Yes (Comment) Care giver support system in place?: Yes (comment) Current home services: DME (rolling walker, cane, shower seat) Criminal Activity/Legal Involvement Pertinent to Current Situation/Hospitalization: No - Comment as needed  Activities of Daily Living   ADL Screening (condition at time of admission) Independently performs ADLs?: No Does the patient have a NEW difficulty with bathing/dressing/toileting/self-feeding that is expected to last >3 days?: Yes (Initiates electronic notice to provider for possible OT consult) Does the patient have a NEW difficulty with getting in/out of bed, walking, or climbing stairs that is expected to last >3 days?: Yes (Initiates electronic notice to provider for possible PT consult) Does the patient have a NEW difficulty with communication that is expected to last >3 days?: No Is the patient deaf or have difficulty hearing?: Yes Does the patient have difficulty seeing, even when wearing glasses/contacts?: No Does the patient have difficulty concentrating, remembering, or making decisions?: No  Permission Sought/Granted Permission sought to share information with : Case Manager, Family Supports Permission granted to share information with : Yes, Verbal Permission Granted     Permission granted to share info w AGENCY: HH  Permission granted to share info w Relationship: son, daughter     Emotional Assessment Appearance:: Appears stated age Attitude/Demeanor/Rapport: Engaged Affect (typically observed): Appropriate Orientation: : Oriented to Self,  Oriented to Place, Oriented to  Time, Oriented to Situation Alcohol  / Substance Use: Not Applicable Psych Involvement: No (comment)  Admission diagnosis:  Acute exacerbation of CHF (congestive heart failure) (HCC) [I50.9] Acute on chronic combined systolic and diastolic congestive heart failure (HCC) [I50.43] Patient Active Problem List   Diagnosis  Date Noted   Acute exacerbation of CHF (congestive heart failure) (HCC) 10/03/2023   Acute hypoxic respiratory failure (HCC) 10/03/2023   Ground-level fall 10/03/2023   ARF (acute renal failure) (HCC) 09/24/2023   Essential hypertension 09/24/2023   Chronic systolic CHF (congestive heart failure) (HCC) 09/24/2023   Renal failure 09/24/2023   Moderately severe major depression (HCC) 05/24/2023   CKD stage 3b, GFR 30-44 ml/min (HCC) 06/19/2022   History of heart artery stent 01/07/2022   Knee pain 01/07/2022   Hearing loss 01/07/2022   Benign essential hypertension 01/07/2022   Generalized weakness 09/26/2021   UTI (urinary tract infection) 09/26/2021   GERD (gastroesophageal reflux disease)    Acute cystitis    Sepsis (HCC) 09/25/2021   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   Acute blood loss anemia    GI bleed 09/16/2020   Diverticulosis 09/16/2020   Rectal bleeding    Anemia    Stenosis of left subclavian artery (HCC) 08/22/2020   CHF (congestive heart failure) (HCC) 08/15/2020   Hypertensive urgency 02/20/2020   Chronic combined systolic and diastolic heart failure (HCC) 02/19/2020   Carpal tunnel syndrome of right wrist 04/12/2018   Thrombocytopenia (HCC) 03/21/2018   Aftercare 03/08/2018   Ulnar neuropathy of left upper extremity 01/24/2018   Ulnar neuropathy of right upper extremity 01/24/2018   Bilateral carpal tunnel syndrome 01/24/2018   Bilateral shoulder pain 01/11/2018   Cervical spine pain 01/11/2018   Pain in left knee 01/11/2018   Pain in thoracic spine 01/11/2018   Asymptomatic carotid artery stenosis 11/27/2015   Carpal tunnel syndrome 06/12/2015   Special screening for malignant neoplasms, colon 11/08/2014   Encounter for long-term (current) use of antiplatelets/antithrombotics 11/08/2014   Anemia, iron deficiency 06/04/2014   Cerebral infarction due to stenosis of right carotid artery (HCC) 04/12/2014   Carotid occlusion, left 04/12/2014   PVD  (peripheral vascular disease) (HCC) 04/12/2014   Coronary artery disease involving native coronary artery of native heart with angina pectoris (HCC) 04/12/2014   B12 deficiency 04/12/2014   Cerebral infarction [facility])    Cerebral infarction (HCC) 03/26/2014   Stroke (HCC) 03/26/2014   BPH (benign prostatic hyperplasia) 11/28/2013   Claudication (HCC) 09/04/2013   Pre-op exam 08/17/2013   Dyspnea 08/17/2013   CAD S/P percutaneous coronary angioplasty 07/25/2013   AAA (abdominal aortic aneurysm) (HCC) 07/25/2013   STEMI (ST elevation myocardial infarction) (HCC) 07/02/2013   Peripheral vascular disease (HCC) 07/02/2013   Occlusion and stenosis of carotid artery with cerebral infarction 10/25/2012   Hyperlipidemia 09/06/2012   Hypertension 09/06/2012   Arthritis 09/06/2012   History of stroke 09/06/2012   PCP:  Alphonsa Glendia LABOR, MD Pharmacy:   [phone] [place] [place] [facility] [address] [place] [address] Phone: [phone] Fax: [phone]  [facility] [address] [place] [address] Phone: [phone] Fax: [phone]     Social Drivers of Health (SDOH) Social History: SDOH Screenings   Food Insecurity: No Food Insecurity (10/03/2023)  Housing: Unknown (10/04/2023)  Recent Concern: Housing - High Risk (10/03/2023)  Transportation Needs: No Transportation Needs (10/04/2023)  Utilities:  Not At Risk (10/03/2023)  Alcohol  Screen: Low Risk  (10/04/2023)  Depression (PHQ2-9): High Risk (09/22/2023)  Financial Resource Strain: Low Risk  (10/04/2023)  Physical Activity: Insufficiently Active (01/07/2022)  Social Connections: Moderately Isolated (10/03/2023)  Stress: No Stress Concern Present (01/07/2022)  Tobacco Use: Medium Risk (10/02/2023)   SDOH Interventions: Housing Interventions: Intervention Not Indicated Transportation Interventions: Intervention Not Indicated Alcohol  Usage  Interventions: Intervention Not Indicated (Score <7) Financial Strain Interventions: Intervention Not Indicated   Readmission Risk Interventions    09/29/2021   11:16 AM  Readmission Risk Prevention Plan  Transportation Screening Complete  PCP or Specialist Appt within 5-7 Days Complete  Home Care Screening Complete  Medication Review (RN CM) Complete

## 2023-10-05 NOTE — TOC Transition Note (Signed)
 Transition of Care [facility]) - Discharge Note   Patient Details  Name: [name] [name] MRN: [id number] Date of Birth: 09-10-1934  Transition of Care [facility]) CM/SW Contact:  Waddell Barnie Rama, RN Phone Number: 10/05/2023, 10:11 AM   Clinical Narrative:    For dc today, son will be picking him up.   Final next level of care: Home w Home Health Services Barriers to Discharge: No Barriers Identified   Patient Goals and CMS Choice Patient states their goals for this hospitalization and ongoing recovery are:: return home CMS Medicare.gov Compare Post Acute Care list provided to:: Patient Represenative (must comment) Choice offered to / list presented to : Adult Children      Discharge Placement                       Discharge Plan and Services Additional resources added to the After Visit Summary for     Discharge Planning Services: CM Consult Post Acute Care Choice: Home Health            DME Agency: NA       HH Arranged: RN, PT, OT [facility] Agency: [facility] Date [facility] Agency Contacted: 10/05/23 Time HH Agency Contacted: 1004 Representative spoke with at [facility] Agency: Cindi  Social Drivers of Health (SDOH) Interventions SDOH Screenings   Food Insecurity: No Food Insecurity (10/03/2023)  Housing: Unknown (10/04/2023)  Recent Concern: Housing - High Risk (10/03/2023)  Transportation Needs: No Transportation Needs (10/04/2023)  Utilities: Not At Risk (10/03/2023)  Alcohol  Screen: Low Risk  (10/04/2023)  Depression (PHQ2-9): High Risk (09/22/2023)  Financial Resource Strain: Low Risk  (10/04/2023)  Physical Activity: Insufficiently Active (01/07/2022)  Social Connections: Moderately Isolated (10/03/2023)  Stress: No Stress Concern Present (01/07/2022)  Tobacco Use: Medium Risk (10/02/2023)     Readmission Risk Interventions    09/29/2021   11:16 AM  Readmission Risk Prevention Plan  Transportation Screening Complete  PCP or Specialist Appt within 5-7 Days Complete   Home Care Screening Complete  Medication Review (RN CM) Complete

## 2023-10-05 NOTE — Progress Notes (Signed)
   Heart Failure Stewardship Pharmacist Progress Note   PCP: Alphonsa Glendia LABOR, MD PCP-Cardiologist: Dorn Lesches, MD    HPI:  89 yo M with PMH of CHF, CAD, B cell lymphoma, AAA, HTN, HLD, and BPH.   Recently admitted 6/26-6/29 with acute renal failure, was not eating well for a few weeks. Renal CT with no acute localizing process in the abdomen or pelvis. Stable 6,3 cm infrarenal abdominal aortic aneurysm. Entresto  was stopped and he was started on amlodipine  for HTN control. PRN lasix  added for discharge.   Presented back to the ED on 7/5 with shortness of breath and weight gain. Was not taking his lasix  due to confusion with PRN instructions. CXR with mild pulmonary edema. Started on IV lasix . ECHO 7/6 with LVEF 35-40% (30-35% in 11/2022), RWMA, moderate concentric LVH, G1DD, RV normal.   Met with patient at bedside. His son has not been up here yet today. He is hard of hearing and wears a hearing aid. Says he is feeling better. Breathing is better but not 100% yet. Just walked with PT and is feeling good. No LE edema.   His son reports that the patient's daughter usually helps him fill the pill box for him for the week and picks up his medications from the pharmacy.   Current HF Medications: Diuretic: furosemide  40 mg IV BID Beta Blocker: carvedilol  12.5 mg BID  Prior to admission HF Medications: Diuretic: furosemide  20 mg daily PRN Beta blocker: carvedilol  12.5 mg BID ACE/ARB/ARNI: Entresto  49/51 mg BID - stopped recently due to elevated BUN  Pertinent Lab Values: Serum creatinine 1.73, BUN 45, Potassium 4.1, Sodium 138, BNP 1989, Magnesium  1.6  Vital Signs: Weight: 139 lbs (admission weight: 149 lbs) Blood pressure: 130-150/90s  Heart rate: 70s  I/O: net -3.4L yesterday; net -4.7L since admission  Medication Assistance / Insurance Benefits Check: Does the patient have prescription insurance?  Yes Type of insurance plan: [employer] Medicare  Outpatient Pharmacy:   Prior to admission [facility] Is the patient willing to use [facility] TOC pharmacy at discharge? Yes Is the patient willing to transition their outpatient pharmacy to utilize a [facility] outpatient pharmacy?   No    Assessment: 1. Acute on chronic systolic CHF (LVEF 35-40%), due to ICM. NYHA class III symptoms. - On furosemide  40 mg IV BID - may need to transition to PO lasix . Strict I/Os and daily weights. Keep K>4 and Mg>2. Recommend magnesium  2g IV x 1 for replacement.  - Continue carvedilol  12.5 mg BID - Holding Entresto  with AKI - was stopped at last hospitalization due to acute renal failure. Creatinine further increased today. - No MRA or SGLT2i with AKI as well   Plan: 1) Medication changes recommended at this time: - Magnesium  2g IV x 1 - Recommend furosemide  40 mg daily at discharge since he gets confused with PRN dosing  2) Patient assistance: - None pending  3)  Education  - Patient has been educated on current HF medications and potential additions to HF medication regimen - Patient verbalizes understanding that over the next few months, these medication doses may change and more medications may be added to optimize HF regimen - Patient has been educated on basic disease state pathophysiology and goals of therapy   Duwaine Plant, PharmD, BCPS Heart Failure Stewardship Pharmacist Phone [phone]

## 2023-10-05 NOTE — Telephone Encounter (Signed)
             FYI Only or Action Required?: Action required by provider: request for appointment.  Patient was last seen in primary care on 09/29/2023 by Alphonsa Glendia LABOR, MD.  Called Nurse Triage reporting Appointment.  Symptoms began N/A.SABRA  Interventions attempted: Other: hospitalized.  Symptoms are: acute CHF exacerbation stable.  Triage Disposition: Call PCP Now  Patient/caregiver understands and will follow disposition?: Yes               pt need a follow up hospital appt. Contact daughter to follow up    Reason for Disposition  Call about patient who is currently hospitalized  Answer Assessment - Initial Assessment Questions 1. REASON FOR CALL or QUESTION: What is your reason for calling today? or How can I best help you? or What question do you have that I can help answer?     Patient is being discharged from the hospital today and will need a hospital follow up appointment for his CHF exacerbation. No available appointments with PCP and daughter is not interested scheduling with other, available providers. She would like to know if Dr Alphonsa can fit the patient in next week for HFU appointment.  2. CALLER: Document the source of call. (e.g., laboratory, patient).     Daughter, [name].  Protocols used: PCP Call - No Triage-A-AH

## 2023-10-05 NOTE — Progress Notes (Deleted)
 SATURATION QUALIFICATIONS: (This note is used to comply with regulatory documentation for home oxygen )  Patient Saturations on Room Air at Rest = 98%  Patient Saturations on Room Air while Ambulating = 94%  Did not need 02 while walking.

## 2023-10-05 NOTE — Telephone Encounter (Signed)
 Hospital follow up Tuesday at 10:40 has been scheduled

## 2023-10-06 ENCOUNTER — Telehealth: Payer: Self-pay | Admitting: *Deleted

## 2023-10-06 NOTE — Transitions of Care (Post Inpatient/ED Visit) (Signed)
   10/06/2023  Name: KEIJUAN SCHELLHASE MRN: [id number] DOB: Mar 20, 1935  Today's TOC FU Call Status: Today's TOC FU Call Status:: Unsuccessful Call (1st Attempt) Unsuccessful Call (1st Attempt) Date: 10/06/23  Attempted to reach the patient regarding the most recent Inpatient/ED visit.  Follow Up Plan: Additional outreach attempts will be made to reach the patient to complete the Transitions of Care (Post Inpatient/ED visit) call.   Mliss Creed [facility], BSN RN Care Manager/ Transition of Care [place]/ [facility] [place] [phone]

## 2023-10-07 ENCOUNTER — Telehealth: Payer: Self-pay | Admitting: *Deleted

## 2023-10-07 NOTE — Transitions of Care (Post Inpatient/ED Visit) (Signed)
 10/07/2023  Name: [name] [name] MRN: [id number] DOB: [birth year]  Today's TOC FU Call Status: Today's TOC FU Call Status:: Successful TOC FU Call Completed TOC FU Call Complete Date: 10/07/23 Patient's Name and Date of Birth confirmed.  Transition Care Management Follow-up Telephone Call Date of Discharge: 10/05/23 Discharge Facility: [facility]) Type of Discharge: Inpatient Admission Primary Inpatient Discharge Diagnosis:: Acute on chronic combined systolic and diastolic congestive heart failure How have you been since you were released from the hospital?:  (appetite better, drinking 2 Ensure daily, no issues with bowel/ bladder, ambulating with walker) Any questions or concerns?: No  Items Reviewed: Did you receive and understand the discharge instructions provided?: Yes Medications obtained,verified, and reconciled?: Yes (Medications Reviewed) Any new allergies since your discharge?: No Dietary orders reviewed?: Yes Type of Diet Ordered:: low sodium,  heart healthy Do you have support at home?: Yes People in Home [RPT]: spouse Name of Support/Comfort Primary Source: Heron Pae Reviewed HF action plan Reviewed safety precautions  Medications Reviewed Today: Medications Reviewed Today     Reviewed by Aura Mliss LABOR, RN (Registered Nurse) on 10/07/23 at 1151  Med List Status: <None>   Medication Order Taking? Sig Documenting Provider Last Dose Status Informant  acetaminophen  (TYLENOL ) 500 MG tablet [id number] Yes Take 500 mg by mouth every 6 (six) hours as needed for mild pain (pain score 1-3) (or headaches). [provider]  Active Child, Pharmacy Records  amLODipine  (NORVASC ) 10 MG tablet [id number] Yes Take 1 tablet (10 mg total) by mouth daily. Arrien, Elidia Sieving, MD  Active Child, Pharmacy Records  atorvastatin  (LIPITOR) 20 MG tablet [id number] Yes TAKE 1 TABLET(20 MG) BY MOUTH DAILY Duke, Jon Garre, PA  Active Child, Pharmacy Records   carvedilol  (COREG ) 12.5 MG tablet [id number] Yes Take 1 tablet (12.5 mg total) by mouth 2 (two) times daily with a meal. Arrien, Elidia Sieving, MD  Active Child, Pharmacy Records  clopidogrel  (PLAVIX ) 75 MG tablet [id number] Yes TAKE 1 TABLET BY MOUTH EVERY DAY Court Dorn PARAS, MD  Active Child, Pharmacy Records  feeding supplement (ENSURE PLUS HIGH PROTEIN) LIQD [id number] Yes Take 237 mLs by mouth 2 (two) times daily between meals. Arrien, Elidia Sieving, MD  Active Child, Pharmacy Records  furosemide  (LASIX ) 40 MG tablet [id number] Yes Take 1 tablet (40 mg total) by mouth daily. Fairy Frames, MD  Active   methylcellulose (ARTIFICIAL TEARS) 1 % ophthalmic solution [id number] Yes Place 1 drop into both eyes 3 (three) times daily as needed (for dryness). [provider]  Active Child, Pharmacy Records  pantoprazole  (PROTONIX ) 20 MG tablet [id number] Yes TAKE 1 TABLET BY MOUTH TWICE A DAY Luking, Scott A, MD  Active Child, Pharmacy Records  Phenylephrine -Acetaminophen  5-325 MG CAPS [id number] Yes Take 1 capsule by mouth every 8 (eight) hours as needed (for allergic symptoms). [provider]  Active Child, Pharmacy Records  tamsulosin  (FLOMAX ) 0.4 MG CAPS capsule [id number] Yes TAKE 1 CAPSULE EACH EVENING Luking, Glendia LABOR, MD  Active Child, Pharmacy Records  traMADol  (ULTRAM ) 50 MG tablet [id number] Yes TAKE 1 TABLET BY MOUTH EVERY 8 HOURS AS NEEDED Alphonsa Glendia LABOR, MD  Active Child, Pharmacy Records  Med List Note Teretha Renaee SAILOR, RPH-CPP 01/13/21 1244): Etoposide  filled at [facility]            Home Care and Equipment/Supplies: Were Home Health Services Ordered?:  (per Jasmine at [facility], will do home visit 7/11, will call pt this afternoon to set up a time for  nurse Elida, pt, spouse verbalize understanding) Name of Home Health Agency:: [facility] Has Agency set up a time to come to your home?: No EMR reviewed for Home Health Orders: Orders present/patient has  not received call (refer to CM for follow-up) Any new equipment or medical supplies ordered?: No  Functional Questionnaire: Do you need assistance with bathing/showering or dressing?: Yes (shower seat) Do you need assistance with meal preparation?: Yes (spouse assists) Do you need assistance with eating?: No Do you have difficulty maintaining continence: No Do you need assistance with getting out of bed/getting out of a chair/moving?: Yes (walker) Do you have difficulty managing or taking your medications?: Yes (spouse provides oversight)  Follow up appointments reviewed: PCP Follow-up appointment confirmed?: Yes Date of PCP follow-up appointment?: 10/12/23 Follow-up Provider: Glendia Fielding MD  @ 1040 am Specialist Hospital Follow-up appointment confirmed?: Yes Date of Specialist follow-up appointment?: 10/11/23 Follow-Up Specialty Provider:: [facility]  @ 1145 am Do you need transportation to your follow-up appointment?: No Do you understand care options if your condition(s) worsen?: Yes-patient verbalized understanding  SDOH Interventions Today    Flowsheet Row Most Recent Value  SDOH Interventions   Food Insecurity Interventions Intervention Not Indicated  Housing Interventions Intervention Not Indicated  Transportation Interventions Intervention Not Indicated  Utilities Interventions Intervention Not Indicated    Mliss Creed [facility], BSN RN Care Manager/ Transition of Care [place]/ [facility] [phone]

## 2023-10-07 NOTE — Transitions of Care (Post Inpatient/ED Visit) (Signed)
   10/07/2023  Name: [name] [name] MRN: [id number] DOB: 1935-01-01  Today's TOC FU Call Status: Today's TOC FU Call Status:: Unsuccessful Call (2nd Attempt) Unsuccessful Call (2nd Attempt) Date: 10/07/23  Attempted to reach the patient regarding the most recent Inpatient/ED visit.  Follow Up Plan: Additional outreach attempts will be made to reach the patient to complete the Transitions of Care (Post Inpatient/ED visit) call.   Dewitte Creed RNC, BSN RN Care Manager/ Transition of Care [place]/ [facility] [phone]

## 2023-10-10 DIAGNOSIS — I2510 Atherosclerotic heart disease of native coronary artery without angina pectoris: Secondary | ICD-10-CM | POA: Diagnosis not present

## 2023-10-10 DIAGNOSIS — J9601 Acute respiratory failure with hypoxia: Secondary | ICD-10-CM | POA: Diagnosis not present

## 2023-10-10 DIAGNOSIS — N1832 Chronic kidney disease, stage 3b: Secondary | ICD-10-CM | POA: Diagnosis not present

## 2023-10-10 DIAGNOSIS — D631 Anemia in chronic kidney disease: Secondary | ICD-10-CM | POA: Diagnosis not present

## 2023-10-10 DIAGNOSIS — D630 Anemia in neoplastic disease: Secondary | ICD-10-CM | POA: Diagnosis not present

## 2023-10-10 DIAGNOSIS — C851A Unspecified b-cell lymphoma, in remission: Secondary | ICD-10-CM | POA: Diagnosis not present

## 2023-10-10 DIAGNOSIS — I5023 Acute on chronic systolic (congestive) heart failure: Secondary | ICD-10-CM | POA: Diagnosis not present

## 2023-10-10 DIAGNOSIS — I70202 Unspecified atherosclerosis of native arteries of extremities, left leg: Secondary | ICD-10-CM | POA: Diagnosis not present

## 2023-10-10 DIAGNOSIS — I130 Hypertensive heart and chronic kidney disease with heart failure and stage 1 through stage 4 chronic kidney disease, or unspecified chronic kidney disease: Secondary | ICD-10-CM | POA: Diagnosis not present

## 2023-10-10 DIAGNOSIS — I160 Hypertensive urgency: Secondary | ICD-10-CM | POA: Diagnosis not present

## 2023-10-10 DIAGNOSIS — N179 Acute kidney failure, unspecified: Secondary | ICD-10-CM | POA: Diagnosis not present

## 2023-10-11 ENCOUNTER — Encounter (HOSPITAL_COMMUNITY): Payer: Self-pay

## 2023-10-11 ENCOUNTER — Telehealth (HOSPITAL_COMMUNITY): Payer: Self-pay

## 2023-10-11 ENCOUNTER — Ambulatory Visit (HOSPITAL_COMMUNITY)
Admit: 2023-10-11 | Discharge: 2023-10-11 | Disposition: A | Discharge status: Home / Self Care | Attending: Internal Medicine | Admitting: Internal Medicine

## 2023-10-11 ENCOUNTER — Other Ambulatory Visit (HOSPITAL_COMMUNITY): Payer: Self-pay

## 2023-10-11 VITALS — BP 134/86 | HR 58 | Ht 67.0 in | Wt 141.6 lb

## 2023-10-11 DIAGNOSIS — C8510 Unspecified B-cell lymphoma, unspecified site: Secondary | ICD-10-CM | POA: Insufficient documentation

## 2023-10-11 DIAGNOSIS — N400 Enlarged prostate without lower urinary tract symptoms: Secondary | ICD-10-CM | POA: Diagnosis not present

## 2023-10-11 DIAGNOSIS — I10 Essential (primary) hypertension: Secondary | ICD-10-CM

## 2023-10-11 DIAGNOSIS — Z955 Presence of coronary angioplasty implant and graft: Secondary | ICD-10-CM | POA: Insufficient documentation

## 2023-10-11 DIAGNOSIS — Z7902 Long term (current) use of antithrombotics/antiplatelets: Secondary | ICD-10-CM | POA: Insufficient documentation

## 2023-10-11 DIAGNOSIS — I5022 Chronic systolic (congestive) heart failure: Secondary | ICD-10-CM | POA: Diagnosis not present

## 2023-10-11 DIAGNOSIS — Z79899 Other long term (current) drug therapy: Secondary | ICD-10-CM | POA: Diagnosis not present

## 2023-10-11 DIAGNOSIS — Z87891 Personal history of nicotine dependence: Secondary | ICD-10-CM | POA: Insufficient documentation

## 2023-10-11 DIAGNOSIS — I252 Old myocardial infarction: Secondary | ICD-10-CM | POA: Diagnosis not present

## 2023-10-11 DIAGNOSIS — I714 Abdominal aortic aneurysm, without rupture, unspecified: Secondary | ICD-10-CM | POA: Insufficient documentation

## 2023-10-11 DIAGNOSIS — I2510 Atherosclerotic heart disease of native coronary artery without angina pectoris: Secondary | ICD-10-CM | POA: Insufficient documentation

## 2023-10-11 DIAGNOSIS — I110 Hypertensive heart disease with heart failure: Secondary | ICD-10-CM | POA: Insufficient documentation

## 2023-10-11 DIAGNOSIS — E785 Hyperlipidemia, unspecified: Secondary | ICD-10-CM | POA: Diagnosis not present

## 2023-10-11 DIAGNOSIS — R531 Weakness: Secondary | ICD-10-CM | POA: Diagnosis not present

## 2023-10-11 MED ORDER — FUROSEMIDE 20 MG PO TABS: 20.0000 mg | ORAL_TABLET | Freq: Every day | ORAL | 3 refills | Status: DC | PRN

## 2023-10-11 MED ORDER — EMPAGLIFLOZIN 10 MG PO TABS: 10.0000 mg | ORAL_TABLET | Freq: Every day | ORAL | 3 refills | Status: DC

## 2023-10-11 NOTE — Addendum Note (Signed)
 Encounter addended by: [place] Geofm NOVAK, RN on: 10/11/2023 1:14 PM  Actions taken: Order list changed, Diagnosis association updated

## 2023-10-11 NOTE — Telephone Encounter (Signed)
 Advanced Heart Failure Patient Advocate Encounter  Test billing for this patients current coverage returns copays of $47 for 30 days, $117.50 for 90 days of Farxiga  or Jardiance . Patient expressed understanding and does not wish to pursue medication assistance options at this time.  Rachel DEL, CPhT Rx Patient Advocate Phone: [phone]

## 2023-10-11 NOTE — Patient Instructions (Signed)
 Medication Changes:  CHANGE LASIX  (FUROSEMIDE )  TO 20MG  DAILY AS NEEDED FOR SWELLING OR WEIGHT GAIN   START: JARDIANCE  10MG  ONCE DAILY   Lab Work:  Labs done today, your results will be available in MyChart, we will contact you for abnormal readings.  PLEASE RETURN FOR LABS IN 7-10 DAY'S AS SCHEDULED   Follow-Up in: AS SCHEDULED WITH DR. RANEE OFFICE   At the Advanced Heart Failure Clinic, you and your health needs are our priority. We have a designated team specialized in the treatment of Heart Failure. This Care Team includes your primary Heart Failure Specialized Cardiologist (physician), Advanced Practice Providers (APPs- Physician Assistants and Nurse Practitioners), and Pharmacist who all work together to provide you with the care you need, when you need it.   You may see any of the following providers on your designated Care Team at your next follow up:  Dr. Toribio Fuel Dr. Ezra Shuck Dr. Ria Commander Dr. Odis Brownie Greig Mosses, NP Caffie Shed, [name] [facility] [place], [name] Beckey Coe, NP [place] Lee, NP Tinnie Redman, PharmD   Please be sure to bring in all your medications bottles to every appointment.   Need to Contact Us :  If you have any questions or concerns before your next appointment please send us  a message through [place] or call our office at [phone].    TO LEAVE A MESSAGE FOR THE NURSE SELECT OPTION 2, PLEASE LEAVE A MESSAGE INCLUDING: YOUR NAME DATE OF BIRTH CALL BACK NUMBER REASON FOR CALL**this is important as we prioritize the call backs  YOU WILL RECEIVE A CALL BACK THE SAME DAY AS LONG AS YOU CALL BEFORE 4:00 PM

## 2023-10-12 ENCOUNTER — Ambulatory Visit: Admitting: Family Medicine

## 2023-10-12 VITALS — BP 158/72 | HR 59 | Temp 98.1°F

## 2023-10-12 DIAGNOSIS — I5042 Chronic combined systolic (congestive) and diastolic (congestive) heart failure: Secondary | ICD-10-CM

## 2023-10-12 DIAGNOSIS — R7989 Other specified abnormal findings of blood chemistry: Secondary | ICD-10-CM

## 2023-10-12 DIAGNOSIS — N183 Chronic kidney disease, stage 3 unspecified: Secondary | ICD-10-CM

## 2023-10-12 NOTE — Progress Notes (Signed)
   Subjective:    Patient ID: DRAEDYN WEIDINGER, male    DOB: 01/13/1935, 89 y.o.   MRN: [id number]  HPI Pt comes in today for hospital follow up, pt would like labs done today. Medications and allergies reviewed.    History of Present Illness   The patient, with diabetes and congestive heart failure, presents for follow-up after starting Jardiance .  He recently started Jardiance  for diabetes and congestive heart failure management. He is scheduled for follow-up blood work next week to monitor kidney function and ensure tolerance to the new medication. A nurse practitioner previously ordered a metabolic panel and BNP, which can be done at the current location.  He is currently taking a diuretic on an as-needed basis, depending on his weight. There is concern about potential dehydration due to Jardiance , especially if he experiences decreased fluid intake or increased fluid loss through sweating or gastrointestinal symptoms.  He experienced a fall while attempting to weigh himself, leading to back pain. His daughter reports difficulty with daily weighing due to the scale's size and suggests using a wider base scale or assistance from visiting nurses.  He is eating and drinking well, which is a positive change from his prior condition.  His daughter ensures he is weighed regularly and monitors for signs of fluid retention, such as swelling in the legs.      Review of Systems     Objective:   Physical Exam General-in no acute distress Eyes-no discharge Lungs-respiratory rate normal, CTA CV-no murmurs Extremities skin warm dry no edema Neuro grossly normal Behavior normal, alert       Assessment & Plan:  Initiated on Jardiance  for glucose management and heart failure benefits. Educated on hydration and dehydration risks. - Order blood work today and follow-up in one week to assess kidney function. - Educate on dehydration signs and hydration importance. - Instruct to hold  Jardiance  if experiencing vomiting or diarrhea.  Chronic Kidney Disease Chronic kidney disease with concerning creatinine levels. Close monitoring required due to Jardiance  initiation. - Order blood work today to establish baseline kidney function. - Order follow-up blood work in one week and plan additional follow-up in 3-4 weeks if necessary.  Congestive Heart Failure Managed with as-needed diuretics. Jardiance  effective for heart failure. Regular weight and fluid status monitoring discussed. - Monitor weight regularly with visiting nurses' assistance. - Consider obtaining a wider base scale for easier weight monitoring. - Schedule follow-up with cardiology and inform primary care provider of appointment date.  Follow-up Standard follow-up scheduled for October. Additional follow-up may be needed based on results. - Schedule standard follow-up appointment in October. - Adjust follow-up schedule based on blood work and cardiology appointment results. - Encourage communication of any condition changes or concerns.  1. Elevated serum creatinine (Primary) Check lab work continue current measures cautions given regarding medication Jardiance  - Basic metabolic panel with GFR  2. Stage 3 chronic kidney disease, unspecified whether stage 3a or 3b CKD (HCC) Jardiance  being used cautions given check lab work cardiology will be doing labs next week then we will look at doing labs again in 4 to 6 weeks  3. Chronic combined systolic and diastolic congestive heart failure (HCC) Stable currently no sign of flareup  Dietary improving

## 2023-10-13 ENCOUNTER — Other Ambulatory Visit: Payer: Self-pay | Admitting: *Deleted

## 2023-10-13 NOTE — Patient Instructions (Signed)
 Visit Information  Thank you for taking time to visit with me today. Please don't hesitate to contact me if I can be of assistance to you before our next scheduled telephone appointment.  Our next appointment is by telephone on patient at 10/20/23 @ 930 am  Following is a copy of your care plan:   Goals Addressed             This Visit's Progress    VBCI Transitions of Care (TOC) Care Plan       Problems:  Recent Hospitalization for treatment of CHF Knowledge Deficit Related to CHF- pt is unable to weigh daily due to safety concerns and High risk for falls Patient's spouse states they have been unable to schedule a visit with cardiologist Dr. Court due to being on hold for over an hour, then the call dropped and sent message in my chart  Goal:  Over the next 30 days, the patient will not experience hospital readmission  Interventions:  Heart Failure Interventions: Basic overview and discussion of pathophysiology of Heart Failure reviewed Discussed importance of daily weight and advised patient to weigh and record daily Screening for signs and symptoms of depression related to chronic disease state  Assessed social determinant of health barriers  Reinforced Heart Failure action plan, reportable signs/ symptoms Reinforced safety precautions, importance of using walker RN CM completed a 3 way call with Dr. Ranee office and patient's daughter [name] [place], appointment scheduled for 11/01/23 @ 130 pm  Patient Self Care Activities:  Attend all scheduled provider appointments Call pharmacy for medication refills 3-7 days in advance of running out of medications Call provider office for new concerns or questions  Notify RN Care Manager of TOC call rescheduling needs Participate in Transition of Care Program/Attend TOC scheduled calls Take medications as prescribed   keep legs up while sitting use salt in moderation watch for swelling in feet, ankles and legs every day develop a  rescue plan follow rescue plan if symptoms flare-up dress right for the weather, hot or cold Continue working with [place] home health RN and PT Fall prevention strategies: change positions slowly, use a walker or cane (per provider recommendations) when walking, keep walkways clear, have good lighting in room. It is important to contact your provider if you have any falls. Maintain muscle strength/tone by exercise per provider recommendations.  Follow up with Dr. Court on 11/01/23 @ 130 pm  Plan:  Telephone follow up appointment with care management team member scheduled for:  10/20/23 @ 930 am The patient has been provided with contact information for the care management team and has been advised to call with any health related questions or concerns.         Patient verbalizes understanding of instructions and care plan provided today and agrees to view in MyChart. Active MyChart status and patient understanding of how to access instructions and care plan via MyChart confirmed with patient.     Telephone follow up appointment with care management team member scheduled for:  10/20/23 @ 930 am  Please call the care guide team at [phone] if you need to cancel or reschedule your appointment.   Please call the Suicide and Crisis Lifeline: 988 call the [place]  National Suicide Prevention Lifeline: 980-412-9036 or TTY: 951-697-3542 TTY 782-296-9232) to talk to a trained counselor call 1-800-273-TALK (toll free, 24 hour hotline) go to [facility] [address] [place] [phone]) call the [facility] Line: [phone] call 911 if  you are experiencing a Mental Health or Behavioral Health Crisis or need someone to talk to.  Mliss Creed [facility], BSN RN Care Manager/ Transition of Care [place]/ [facility] [phone]

## 2023-10-13 NOTE — Patient Outreach (Signed)
 Transition of Care week 2  Visit Note  10/13/2023  Name: [name] [name] MRN: [id number]          DOB: 07-28-1934  Situation: Patient enrolled in [facility] 30-day program. Visit completed with patient by telephone.   Background:     Past Medical History:  Diagnosis Date   Arthritis    all over (09/04/2013)   Bleeds easily (HCC)    d/t being on Plavix  and ASA per pt   CAD (coronary artery disease) 2015   a. STEMI 2002 s/p stent to PDA. b. Anterior STEMI 06/2013 s/p  asp thrombectomy, DES to prox LAD, EF preserved.   Carotid artery disease (HCC) 10/2013    Total occlusion of the LICA, 60-79^ stenosis of the RICA   Enlarged prostate    GERD (gastroesophageal reflux disease)    takes Pantoprazole  daily   Hard of hearing    no hearing aides   History of colon polyps    benign   History of diverticulitis    History of shingles    Hyperlipidemia    takes Pravastatin  daily   Hypertension    takes Amlodipine  and Metoprolol  daily   Joint pain    Joint swelling    Myocardial infarction [facility]) at age 39 and other 07/05/11   Nocturia    PVD (peripheral vascular disease) with claudication (HCC) 2015   a. 08/2013: s/p diamondback orbital rotational atherectomy and 8 mm x 30 mm long ICast covered stent to calcified ostial right common iliac artery. b. 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   Stroke [facility]) ~ 2012    x 2:right side is weaker   Thin skin    Thrombocytopenia (HCC)    Urinary frequency     Assessment: Patient Reported Symptoms: Cognitive Cognitive Status: No symptoms reported, Able to follow simple commands, Normal speech and language skills      Neurological Neurological Review of Symptoms: No symptoms reported    HEENT HEENT Symptoms Reported: No symptoms reported      Cardiovascular Cardiovascular Symptoms Reported: No symptoms reported Does patient have uncontrolled Hypertension?: No Cardiovascular Management  Strategies: Adequate rest, Medication therapy, Routine screening Cardiovascular Self-Management Outcome: 4 (good) Cardiovascular Comment: HF- pt contineus to be unable to weigh due to safety concerns, reinforced HF action plan  Respiratory Respiratory Symptoms Reported: No symptoms reported    Endocrine Endocrine Symptoms Reported: No symptoms reported    Gastrointestinal Gastrointestinal Symptoms Reported: No symptoms reported      Genitourinary Genitourinary Symptoms Reported: No symptoms reported    Integumentary Integumentary Symptoms Reported: No symptoms reported    Musculoskeletal Musculoskelatal Symptoms Reviewed: Difficulty walking, Unsteady gait Musculoskeletal Management Strategies: Adequate rest, Routine screening, Medical device Musculoskeletal Self-Management Outcome: 4 (good)      Psychosocial Psychosocial Symptoms Reported: No symptoms reported         There were no vitals filed for this visit.  Medications Reviewed Today     Reviewed by Aura Mliss LABOR, RN (Registered Nurse) on 10/13/23 at [phone]  Med List Status: <None>   Medication Order Taking? Sig Documenting Provider Last Dose Status Informant  acetaminophen  (TYLENOL ) 500 MG tablet [id number]  Take 500 mg by mouth every 6 (six) hours as needed for mild pain (pain score 1-3) (or headaches). [provider]  Active Child, Pharmacy Records  amLODipine  (NORVASC ) 10 MG tablet [id number]  Take 1 tablet (10 mg total) by mouth daily. Arrien, Elidia Sieving, MD  Active Child, Pharmacy Records  atorvastatin  (LIPITOR) 20 MG tablet [id number]  TAKE 1 TABLET(20 MG) BY MOUTH DAILY Duke, Jon Garre, PA  Active Child, Pharmacy Records  carvedilol  (COREG ) 12.5 MG tablet [id number]  Take 1 tablet (12.5 mg total) by mouth 2 (two) times daily with a meal. Arrien, Elidia Sieving, MD  Active Child, Pharmacy Records  clopidogrel  (PLAVIX ) 75 MG tablet [id number]  TAKE 1 TABLET BY MOUTH EVERY DAY Court Dorn PARAS, MD  Active  Child, Pharmacy Records  empagliflozin  (JARDIANCE ) 10 MG TABS tablet [id number]  Take 1 tablet (10 mg total) by mouth daily before breakfast. Hayes Beckey CROME, NP  Active   feeding supplement (ENSURE PLUS HIGH PROTEIN) LIQD [id number]  Take 237 mLs by mouth 2 (two) times daily between meals. Arrien, Elidia Sieving, MD  Active Child, Pharmacy Records  furosemide  (LASIX ) 20 MG tablet [id number]  Take 1 tablet (20 mg total) by mouth daily as needed (FOR SWELLING OR WEIGHT GAIN OF 3 POUNDS OVERNIGHT OR 5 POUNDS IN 1 WEEK). Hayes Beckey CROME, NP  Active   methylcellulose (ARTIFICIAL TEARS) 1 % ophthalmic solution [id number]  Place 1 drop into both eyes 3 (three) times daily as needed (for dryness). [provider]  Active Child, Pharmacy Records  pantoprazole  (PROTONIX ) 20 MG tablet [id number]  TAKE 1 TABLET BY MOUTH TWICE A DAY Luking, Scott A, MD  Active Child, Pharmacy Records  Phenylephrine -Acetaminophen  5-325 MG CAPS [id number]  Take 1 capsule by mouth every 8 (eight) hours as needed (for allergic symptoms). [provider]  Active Child, Pharmacy Records  tamsulosin  (FLOMAX ) 0.4 MG CAPS capsule [id number]  TAKE 1 CAPSULE EACH EVENING Luking, Glendia LABOR, MD  Active Child, Pharmacy Records  traMADol  (ULTRAM ) 50 MG tablet [id number]  TAKE 1 TABLET BY MOUTH EVERY 8 HOURS AS NEEDED Alphonsa Glendia LABOR, MD  Active Child, Pharmacy Records  Med List Note Teretha Renaee SAILOR, RPH-CPP 01/13/21 1244): Etoposide  filled at [facility]            Recommendation:   PCP Follow-up Specialty provider follow-up 11/01/23 @ 130 pm Dr. Court  Follow Up Plan:   Telephone follow-up 10/20/23 @ 930 am  Mliss Creed [facility], BSN RN Care Manager/ Transition of Care [place]/ [facility] Population Health [phone]

## 2023-10-14 ENCOUNTER — Ambulatory Visit: Payer: Self-pay | Admitting: Family Medicine

## 2023-10-14 ENCOUNTER — Other Ambulatory Visit: Payer: Self-pay

## 2023-10-14 DIAGNOSIS — I10 Essential (primary) hypertension: Secondary | ICD-10-CM

## 2023-10-14 LAB — BASIC METABOLIC PANEL WITH GFR
BUN/Creatinine Ratio: 20 (ref 10–24)
BUN: 33 mg/dL — ABNORMAL HIGH (ref 8–27)
CO2: 21 mmol/L (ref 20–29)
Calcium: 8.5 mg/dL — ABNORMAL LOW (ref 8.6–10.2)
Chloride: 102 mmol/L (ref 96–106)
Creatinine, Ser: 1.61 mg/dL — ABNORMAL HIGH (ref 0.76–1.27)
Glucose: 82 mg/dL (ref 70–99)
Potassium: 4.5 mmol/L (ref 3.5–5.2)
Sodium: 141 mmol/L (ref 134–144)
eGFR: 41 mL/min/1.73 — ABNORMAL LOW (ref >59)

## 2023-10-15 ENCOUNTER — Telehealth: Payer: Self-pay

## 2023-10-15 NOTE — Telephone Encounter (Signed)
 Verbal order for PT given.

## 2023-10-18 ENCOUNTER — Other Ambulatory Visit (HOSPITAL_COMMUNITY)

## 2023-10-18 ENCOUNTER — Telehealth: Payer: Self-pay | Admitting: Family Medicine

## 2023-10-18 NOTE — Telephone Encounter (Unsigned)
 Copied from CRM [id number]. Topic: Clinical - Home Health Verbal Orders >> Oct 18, 2023  4:35 PM Donee H wrote: Caller/Agency: Todd from [facility] Callback Number: [phone] Service Requested: Occupational Therapy Frequency: Caregiver declined for week of July 21 and requesting for week of July 27. Would like to get verbal order for week of 27 Any new concerns about the patient? No

## 2023-10-19 DIAGNOSIS — I10 Essential (primary) hypertension: Secondary | ICD-10-CM | POA: Diagnosis not present

## 2023-10-19 NOTE — Telephone Encounter (Signed)
 Phone call to approve orders

## 2023-10-20 ENCOUNTER — Ambulatory Visit: Payer: Self-pay | Admitting: Family Medicine

## 2023-10-20 ENCOUNTER — Other Ambulatory Visit: Payer: Self-pay

## 2023-10-20 ENCOUNTER — Other Ambulatory Visit: Payer: Self-pay | Admitting: *Deleted

## 2023-10-20 DIAGNOSIS — I10 Essential (primary) hypertension: Secondary | ICD-10-CM

## 2023-10-20 DIAGNOSIS — R339 Retention of urine, unspecified: Secondary | ICD-10-CM | POA: Diagnosis not present

## 2023-10-20 LAB — BASIC METABOLIC PANEL WITH GFR
BUN/Creatinine Ratio: 16 (ref 10–24)
BUN: 23 mg/dL (ref 8–27)
CO2: 20 mmol/L (ref 20–29)
Calcium: 8.3 mg/dL — ABNORMAL LOW (ref 8.6–10.2)
Chloride: 104 mmol/L (ref 96–106)
Creatinine, Ser: 1.42 mg/dL — ABNORMAL HIGH (ref 0.76–1.27)
Glucose: 104 mg/dL — ABNORMAL HIGH (ref 70–99)
Potassium: 4.3 mmol/L (ref 3.5–5.2)
Sodium: 142 mmol/L (ref 134–144)
eGFR: 48 mL/min/1.73 — ABNORMAL LOW (ref >59)

## 2023-10-20 NOTE — Patient Instructions (Signed)
 Visit Information  Thank you for taking time to visit with me today. Please don't hesitate to contact me if I can be of assistance to you before our next scheduled telephone appointment.  Our next appointment is by telephone on 10/27/23 @ 315 pm  Following is a copy of your care plan:   Goals Addressed             This Visit's Progress    VBCI Transitions of Care (TOC) Care Plan       Problems:  Recent Hospitalization for treatment of CHF Knowledge Deficit Related to CHF- pt is unable to weigh daily due to safety concerns and High risk for falls Spoke with patient who reports his wife is not at home, she broke her hip and is in rehab, his family has been helping him and providing assistance as needed, no new concerns reported  Goal:  Over the next 30 days, the patient will not experience hospital readmission  Interventions:  Heart Failure Interventions: Basic overview and discussion of pathophysiology of Heart Failure reviewed Discussed importance of daily weight and advised patient to weigh and record daily Screening for signs and symptoms of depression related to chronic disease state  Assessed social determinant of health barriers  Reviewed Heart Failure action plan, reportable signs/ symptoms Reviewed safety precautions, importance of using walker  Patient Self Care Activities:  Attend all scheduled provider appointments Call pharmacy for medication refills 3-7 days in advance of running out of medications Call provider office for new concerns or questions  Notify RN Care Manager of TOC call rescheduling needs Participate in Transition of Care Program/Attend TOC scheduled calls Take medications as prescribed   keep legs up while sitting use salt in moderation watch for swelling in feet, ankles and legs every day develop a rescue plan follow rescue plan if symptoms flare-up dress right for the weather, hot or cold Continue working with [place] home health RN and PT Fall  prevention strategies: change positions slowly, use a walker or cane (per provider recommendations) when walking, keep walkways clear, have good lighting in room. It is important to contact your provider if you have any falls. Maintain muscle strength/tone by exercise per provider recommendations.  Follow up with Dr. Court on 11/01/23 @ 130 pm  Plan:  Telephone follow up appointment with care management team member scheduled for:  10/27/23 @ 315 pm The patient has been provided with contact information for the care management team and has been advised to call with any health related questions or concerns.         Patient verbalizes understanding of instructions and care plan provided today and agrees to view in MyChart. Active MyChart status and patient understanding of how to access instructions and care plan via MyChart confirmed with patient.     Telephone follow up appointment with care management team member scheduled for: 10/27/23 @ 315 pm  Please call the care guide team at [phone] if you need to cancel or reschedule your appointment.   Please call the Suicide and Crisis Lifeline: 988 call the [place]  National Suicide Prevention Lifeline: 386 178 2381 or TTY: (901) 532-7820 TTY 708-757-6235) to talk to a trained counselor call 1-800-273-TALK (toll free, 24 hour hotline) go to [facility] [address] [place] [phone]) call 911 if you are experiencing a Mental Health or Behavioral Health Crisis or need someone to talk to.  Mliss Creed [facility], BSN RN Care Manager/ Transition of Care [place]/ [facility] [phone]

## 2023-10-20 NOTE — Patient Outreach (Signed)
 Transition of Care week 3  Visit Note  10/20/2023  Name: [name] [name] MRN: [id number]          DOB: 1934-12-27  Situation: Patient enrolled in [facility] 30-day program. Visit completed with patient by telephone.   Background:    Past Medical History:  Diagnosis Date   Arthritis    all over (09/04/2013)   Bleeds easily (HCC)    d/t being on Plavix  and ASA per pt   CAD (coronary artery disease) 2015   a. STEMI 2002 s/p stent to PDA. b. Anterior STEMI 06/2013 s/p  asp thrombectomy, DES to prox LAD, EF preserved.   Carotid artery disease (HCC) 10/2013    Total occlusion of the LICA, 60-79^ stenosis of the RICA   Enlarged prostate    GERD (gastroesophageal reflux disease)    takes Pantoprazole  daily   Hard of hearing    no hearing aides   History of colon polyps    benign   History of diverticulitis    History of shingles    Hyperlipidemia    takes Pravastatin  daily   Hypertension    takes Amlodipine  and Metoprolol  daily   Joint pain    Joint swelling    Myocardial infarction [facility]) at age 21 and other 07/05/11   Nocturia    PVD (peripheral vascular disease) with claudication (HCC) 2015   a. 08/2013: s/p diamondback orbital rotational atherectomy and 8 mm x 30 mm long ICast covered stent to calcified ostial right common iliac artery. b. 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   Stroke [facility]) ~ 2012    x 2:right side is weaker   Thin skin    Thrombocytopenia (HCC)    Urinary frequency     Assessment: Patient Reported Symptoms: Cognitive Cognitive Status: No symptoms reported, Able to follow simple commands, Normal speech and language skills      Neurological Neurological Review of Symptoms: No symptoms reported    HEENT HEENT Symptoms Reported: No symptoms reported      Cardiovascular Cardiovascular Symptoms Reported: No symptoms reported Does patient have uncontrolled Hypertension?: No Cardiovascular Management  Strategies: Adequate rest, Medication therapy Cardiovascular Self-Management Outcome: 4 (good) Cardiovascular Comment: HF- pt is not able to weigh,  RN CM reviewed HF action plan  Respiratory Respiratory Symptoms Reported: No symptoms reported    Endocrine Endocrine Symptoms Reported: No symptoms reported    Gastrointestinal Gastrointestinal Symptoms Reported: No symptoms reported      Genitourinary Genitourinary Symptoms Reported: No symptoms reported    Integumentary Integumentary Symptoms Reported: No symptoms reported    Musculoskeletal Musculoskelatal Symptoms Reviewed: Difficulty walking, Unsteady gait Musculoskeletal Management Strategies: Adequate rest, Medical device, Routine screening Musculoskeletal Self-Management Outcome: 4 (good) Musculoskeletal Comment: uses walker and cane      Psychosocial Psychosocial Symptoms Reported: No symptoms reported         There were no vitals filed for this visit.  Medications Reviewed Today     Reviewed by Aura Mliss LABOR, RN (Registered Nurse) on 10/20/23 at [phone]  Med List Status: <None>   Medication Order Taking? Sig Documenting Provider Last Dose Status Informant  acetaminophen  (TYLENOL ) 500 MG tablet [id number]  Take 500 mg by mouth every 6 (six) hours as needed for mild pain (pain score 1-3) (or headaches). [provider]  Active Child, Pharmacy Records  amLODipine  (NORVASC ) 10 MG tablet [id number]  Take 1 tablet (10 mg total) by mouth daily. Arrien, Elidia Sieving, MD  Active Child, Pharmacy Records  atorvastatin  (LIPITOR) 20 MG tablet [id number]  TAKE 1 TABLET(20 MG) BY MOUTH DAILY Duke, Jon Garre, PA  Active Child, Pharmacy Records  carvedilol  (COREG ) 12.5 MG tablet [id number]  Take 1 tablet (12.5 mg total) by mouth 2 (two) times daily with a meal. Arrien, Elidia Sieving, MD  Active Child, Pharmacy Records  clopidogrel  (PLAVIX ) 75 MG tablet [id number]  TAKE 1 TABLET BY MOUTH EVERY DAY Court Dorn PARAS, MD  Active  Child, Pharmacy Records  empagliflozin  (JARDIANCE ) 10 MG TABS tablet [id number]  Take 1 tablet (10 mg total) by mouth daily before breakfast. Hayes Beckey CROME, NP  Active   feeding supplement (ENSURE PLUS HIGH PROTEIN) LIQD [id number]  Take 237 mLs by mouth 2 (two) times daily between meals. Arrien, Elidia Sieving, MD  Active Child, Pharmacy Records  furosemide  (LASIX ) 20 MG tablet [id number]  Take 1 tablet (20 mg total) by mouth daily as needed (FOR SWELLING OR WEIGHT GAIN OF 3 POUNDS OVERNIGHT OR 5 POUNDS IN 1 WEEK). Hayes Beckey CROME, NP  Active   methylcellulose (ARTIFICIAL TEARS) 1 % ophthalmic solution [id number]  Place 1 drop into both eyes 3 (three) times daily as needed (for dryness). [provider]  Active Child, Pharmacy Records  pantoprazole  (PROTONIX ) 20 MG tablet [id number]  TAKE 1 TABLET BY MOUTH TWICE A DAY Luking, Scott A, MD  Active Child, Pharmacy Records  Phenylephrine -Acetaminophen  5-325 MG CAPS [id number]  Take 1 capsule by mouth every 8 (eight) hours as needed (for allergic symptoms). [provider]  Active Child, Pharmacy Records  tamsulosin  (FLOMAX ) 0.4 MG CAPS capsule [id number]  TAKE 1 CAPSULE EACH EVENING Luking, Glendia LABOR, MD  Active Child, Pharmacy Records  traMADol  (ULTRAM ) 50 MG tablet [id number]  TAKE 1 TABLET BY MOUTH EVERY 8 HOURS AS NEEDED Alphonsa Glendia LABOR, MD  Active Child, Pharmacy Records  Med List Note Teretha Renaee SAILOR, RPH-CPP 01/13/21 1244): Etoposide  filled at [facility]            Recommendation:   PCP Follow-up  Follow Up Plan:   Telephone follow-up 10/27/23 @ 315 pm  Mliss Creed [facility], BSN RN Care Manager/ Transition of Care [place]/ [facility] Population Health [phone]

## 2023-10-22 ENCOUNTER — Emergency Department (HOSPITAL_COMMUNITY)

## 2023-10-22 ENCOUNTER — Inpatient Hospital Stay (HOSPITAL_COMMUNITY)
Admission: EM | Admit: 2023-10-22 | Discharge: 2023-10-26 | DRG: 291 | Disposition: A | Discharge status: Home Health | Attending: Internal Medicine | Admitting: Internal Medicine

## 2023-10-22 ENCOUNTER — Encounter (HOSPITAL_COMMUNITY): Payer: Self-pay

## 2023-10-22 ENCOUNTER — Telehealth: Payer: Self-pay

## 2023-10-22 ENCOUNTER — Other Ambulatory Visit: Payer: Self-pay

## 2023-10-22 DIAGNOSIS — E7800 Pure hypercholesterolemia, unspecified: Secondary | ICD-10-CM | POA: Diagnosis present

## 2023-10-22 DIAGNOSIS — Z66 Do not resuscitate: Secondary | ICD-10-CM | POA: Diagnosis not present

## 2023-10-22 DIAGNOSIS — Z7189 Other specified counseling: Secondary | ICD-10-CM

## 2023-10-22 DIAGNOSIS — Z961 Presence of intraocular lens: Secondary | ICD-10-CM | POA: Diagnosis present

## 2023-10-22 DIAGNOSIS — Z79899 Other long term (current) drug therapy: Secondary | ICD-10-CM | POA: Diagnosis not present

## 2023-10-22 DIAGNOSIS — I10 Essential (primary) hypertension: Secondary | ICD-10-CM | POA: Diagnosis not present

## 2023-10-22 DIAGNOSIS — I714 Abdominal aortic aneurysm, without rupture, unspecified: Secondary | ICD-10-CM | POA: Diagnosis not present

## 2023-10-22 DIAGNOSIS — I130 Hypertensive heart and chronic kidney disease with heart failure and stage 1 through stage 4 chronic kidney disease, or unspecified chronic kidney disease: Secondary | ICD-10-CM | POA: Diagnosis not present

## 2023-10-22 DIAGNOSIS — R0902 Hypoxemia: Secondary | ICD-10-CM | POA: Diagnosis not present

## 2023-10-22 DIAGNOSIS — Z9842 Cataract extraction status, left eye: Secondary | ICD-10-CM

## 2023-10-22 DIAGNOSIS — I5023 Acute on chronic systolic (congestive) heart failure: Secondary | ICD-10-CM | POA: Diagnosis not present

## 2023-10-22 DIAGNOSIS — I509 Heart failure, unspecified: Secondary | ICD-10-CM | POA: Diagnosis not present

## 2023-10-22 DIAGNOSIS — N179 Acute kidney failure, unspecified: Secondary | ICD-10-CM | POA: Diagnosis present

## 2023-10-22 DIAGNOSIS — R0602 Shortness of breath: Secondary | ICD-10-CM

## 2023-10-22 DIAGNOSIS — I4729 Other ventricular tachycardia: Secondary | ICD-10-CM | POA: Diagnosis not present

## 2023-10-22 DIAGNOSIS — I493 Ventricular premature depolarization: Secondary | ICD-10-CM | POA: Diagnosis present

## 2023-10-22 DIAGNOSIS — J45909 Unspecified asthma, uncomplicated: Secondary | ICD-10-CM | POA: Diagnosis present

## 2023-10-22 DIAGNOSIS — Z8249 Family history of ischemic heart disease and other diseases of the circulatory system: Secondary | ICD-10-CM | POA: Diagnosis not present

## 2023-10-22 DIAGNOSIS — J90 Pleural effusion, not elsewhere classified: Secondary | ICD-10-CM | POA: Diagnosis not present

## 2023-10-22 DIAGNOSIS — E782 Mixed hyperlipidemia: Secondary | ICD-10-CM

## 2023-10-22 DIAGNOSIS — Z860100 Personal history of colon polyps, unspecified: Secondary | ICD-10-CM | POA: Diagnosis not present

## 2023-10-22 DIAGNOSIS — R7989 Other specified abnormal findings of blood chemistry: Secondary | ICD-10-CM | POA: Diagnosis not present

## 2023-10-22 DIAGNOSIS — Z888 Allergy status to other drugs, medicaments and biological substances status: Secondary | ICD-10-CM

## 2023-10-22 DIAGNOSIS — K219 Gastro-esophageal reflux disease without esophagitis: Secondary | ICD-10-CM | POA: Diagnosis not present

## 2023-10-22 DIAGNOSIS — R531 Weakness: Secondary | ICD-10-CM | POA: Diagnosis not present

## 2023-10-22 DIAGNOSIS — Z8619 Personal history of other infectious and parasitic diseases: Secondary | ICD-10-CM

## 2023-10-22 DIAGNOSIS — Z7984 Long term (current) use of oral hypoglycemic drugs: Secondary | ICD-10-CM | POA: Diagnosis not present

## 2023-10-22 DIAGNOSIS — N170 Acute kidney failure with tubular necrosis: Secondary | ICD-10-CM | POA: Diagnosis not present

## 2023-10-22 DIAGNOSIS — N400 Enlarged prostate without lower urinary tract symptoms: Secondary | ICD-10-CM | POA: Diagnosis not present

## 2023-10-22 DIAGNOSIS — I2510 Atherosclerotic heart disease of native coronary artery without angina pectoris: Secondary | ICD-10-CM | POA: Diagnosis not present

## 2023-10-22 DIAGNOSIS — I472 Ventricular tachycardia, unspecified: Secondary | ICD-10-CM | POA: Diagnosis present

## 2023-10-22 DIAGNOSIS — Z87891 Personal history of nicotine dependence: Secondary | ICD-10-CM

## 2023-10-22 DIAGNOSIS — I255 Ischemic cardiomyopathy: Secondary | ICD-10-CM | POA: Diagnosis not present

## 2023-10-22 DIAGNOSIS — Z833 Family history of diabetes mellitus: Secondary | ICD-10-CM | POA: Diagnosis not present

## 2023-10-22 DIAGNOSIS — N1832 Chronic kidney disease, stage 3b: Secondary | ICD-10-CM | POA: Diagnosis not present

## 2023-10-22 DIAGNOSIS — Z9861 Coronary angioplasty status: Secondary | ICD-10-CM | POA: Diagnosis not present

## 2023-10-22 DIAGNOSIS — Z8673 Personal history of transient ischemic attack (TIA), and cerebral infarction without residual deficits: Secondary | ICD-10-CM

## 2023-10-22 DIAGNOSIS — I739 Peripheral vascular disease, unspecified: Secondary | ICD-10-CM | POA: Diagnosis present

## 2023-10-22 DIAGNOSIS — I5043 Acute on chronic combined systolic (congestive) and diastolic (congestive) heart failure: Secondary | ICD-10-CM | POA: Diagnosis present

## 2023-10-22 DIAGNOSIS — I2489 Other forms of acute ischemic heart disease: Secondary | ICD-10-CM | POA: Diagnosis present

## 2023-10-22 DIAGNOSIS — C8580 Other specified types of non-Hodgkin lymphoma, unspecified site: Secondary | ICD-10-CM | POA: Diagnosis not present

## 2023-10-22 DIAGNOSIS — Z7902 Long term (current) use of antithrombotics/antiplatelets: Secondary | ICD-10-CM

## 2023-10-22 DIAGNOSIS — Z83438 Family history of other disorder of lipoprotein metabolism and other lipidemia: Secondary | ICD-10-CM

## 2023-10-22 DIAGNOSIS — I071 Rheumatic tricuspid insufficiency: Secondary | ICD-10-CM | POA: Diagnosis not present

## 2023-10-22 DIAGNOSIS — C8330 Diffuse large B-cell lymphoma, unspecified site: Secondary | ICD-10-CM | POA: Diagnosis present

## 2023-10-22 DIAGNOSIS — Z9841 Cataract extraction status, right eye: Secondary | ICD-10-CM

## 2023-10-22 DIAGNOSIS — I447 Left bundle-branch block, unspecified: Secondary | ICD-10-CM | POA: Diagnosis not present

## 2023-10-22 DIAGNOSIS — Z955 Presence of coronary angioplasty implant and graft: Secondary | ICD-10-CM

## 2023-10-22 DIAGNOSIS — I110 Hypertensive heart disease with heart failure: Secondary | ICD-10-CM | POA: Diagnosis not present

## 2023-10-22 DIAGNOSIS — I252 Old myocardial infarction: Secondary | ICD-10-CM

## 2023-10-22 DIAGNOSIS — R778 Other specified abnormalities of plasma proteins: Secondary | ICD-10-CM | POA: Diagnosis not present

## 2023-10-22 DIAGNOSIS — I517 Cardiomegaly: Secondary | ICD-10-CM | POA: Diagnosis not present

## 2023-10-22 DIAGNOSIS — E785 Hyperlipidemia, unspecified: Secondary | ICD-10-CM | POA: Diagnosis present

## 2023-10-22 DIAGNOSIS — J811 Chronic pulmonary edema: Secondary | ICD-10-CM | POA: Diagnosis not present

## 2023-10-22 DIAGNOSIS — I6523 Occlusion and stenosis of bilateral carotid arteries: Secondary | ICD-10-CM | POA: Diagnosis not present

## 2023-10-22 DIAGNOSIS — R0603 Acute respiratory distress: Secondary | ICD-10-CM | POA: Diagnosis present

## 2023-10-22 DIAGNOSIS — R001 Bradycardia, unspecified: Secondary | ICD-10-CM | POA: Diagnosis not present

## 2023-10-22 HISTORY — DX: Chronic combined systolic (congestive) and diastolic (congestive) heart failure: I50.42

## 2023-10-22 LAB — BASIC METABOLIC PANEL WITH GFR
Anion gap: 9 (ref 5–15)
BUN: 30 mg/dL — ABNORMAL HIGH (ref 8–23)
CO2: 22 mmol/L (ref 22–32)
Calcium: 7.9 mg/dL — ABNORMAL LOW (ref 8.9–10.3)
Chloride: 105 mmol/L (ref 98–111)
Creatinine, Ser: 1.37 mg/dL — ABNORMAL HIGH (ref 0.61–1.24)
GFR, Estimated: 50 mL/min — ABNORMAL LOW (ref >60)
Glucose, Bld: 98 mg/dL (ref 70–99)
Potassium: 4.9 mmol/L (ref 3.5–5.1)
Sodium: 136 mmol/L (ref 135–145)

## 2023-10-22 LAB — CBC
HCT: 34.1 % — ABNORMAL LOW (ref 39.0–52.0)
Hemoglobin: 10.9 g/dL — ABNORMAL LOW (ref 13.0–17.0)
MCH: 29.3 pg (ref 26.0–34.0)
MCHC: 32 g/dL (ref 30.0–36.0)
MCV: 91.7 fL (ref 80.0–100.0)
Platelets: 129 K/uL — ABNORMAL LOW (ref 150–400)
RBC: 3.72 MIL/uL — ABNORMAL LOW (ref 4.22–5.81)
RDW: 15.8 % — ABNORMAL HIGH (ref 11.5–15.5)
WBC: 8.4 K/uL (ref 4.0–10.5)
nRBC: 0 % (ref 0.0–0.2)

## 2023-10-22 LAB — TROPONIN I (HIGH SENSITIVITY)
Troponin I (High Sensitivity): 397 ng/L (ref <18)
Troponin I (High Sensitivity): 378 ng/L (ref <18)

## 2023-10-22 LAB — MAGNESIUM: Magnesium: 2.1 mg/dL (ref 1.7–2.4)

## 2023-10-22 LAB — BRAIN NATRIURETIC PEPTIDE: B Natriuretic Peptide: 1604.5 pg/mL — ABNORMAL HIGH (ref 0.0–100.0)

## 2023-10-22 MED ORDER — ACETAMINOPHEN 650 MG RE SUPP
650.0000 mg | Freq: Four times a day (QID) | RECTAL | Status: DC | PRN
Start: 2023-10-22 — End: 2023-10-26

## 2023-10-22 MED ORDER — FUROSEMIDE 10 MG/ML IJ SOLN
40.0000 mg | Freq: Two times a day (BID) | INTRAMUSCULAR | Status: DC
  Administered 2023-10-22 – 2023-10-23 (×2): 40 mg via INTRAVENOUS
  Filled 2023-10-22 (×2): qty 4

## 2023-10-22 MED ORDER — ATORVASTATIN CALCIUM 10 MG PO TABS
10.0000 mg | ORAL_TABLET | Freq: Every day | ORAL | Status: DC
  Administered 2023-10-23 – 2023-10-26 (×4): 10 mg via ORAL
  Filled 2023-10-22 (×4): qty 1

## 2023-10-22 MED ORDER — METHYLCELLULOSE 1 % OP SOLN: 1.0000 [drp] | Freq: Three times a day (TID) | OPHTHALMIC | Status: DC | PRN

## 2023-10-22 MED ORDER — ASPIRIN 325 MG PO TABS
325.0000 mg | ORAL_TABLET | Freq: Once | ORAL | Status: AC
  Administered 2023-10-22: 325 mg via ORAL
  Filled 2023-10-22: qty 1

## 2023-10-22 MED ORDER — POLYVINYL ALCOHOL 1.4 % OP SOLN: 1.0000 [drp] | Freq: Three times a day (TID) | OPHTHALMIC | Status: DC | PRN

## 2023-10-22 MED ORDER — CARVEDILOL 12.5 MG PO TABS: 12.5000 mg | ORAL_TABLET | Freq: Two times a day (BID) | ORAL | Status: DC

## 2023-10-22 MED ORDER — FUROSEMIDE 10 MG/ML IJ SOLN
40.0000 mg | Freq: Once | INTRAMUSCULAR | Status: AC
  Administered 2023-10-22: 40 mg via INTRAVENOUS
  Filled 2023-10-22: qty 4

## 2023-10-22 MED ORDER — CARVEDILOL 6.25 MG PO TABS
6.2500 mg | ORAL_TABLET | Freq: Two times a day (BID) | ORAL | Status: DC
  Administered 2023-10-22 – 2023-10-26 (×9): 6.25 mg via ORAL
  Filled 2023-10-22 (×6): qty 1
  Filled 2023-10-22: qty 2
  Filled 2023-10-22 (×2): qty 1

## 2023-10-22 MED ORDER — ONDANSETRON HCL 4 MG/2ML IJ SOLN: 4.0000 mg | Freq: Four times a day (QID) | INTRAMUSCULAR | Status: DC | PRN

## 2023-10-22 MED ORDER — ENSURE PLUS HIGH PROTEIN PO LIQD
237.0000 mL | Freq: Two times a day (BID) | ORAL | Status: DC
  Administered 2023-10-23 – 2023-10-26 (×4): 237 mL via ORAL
  Filled 2023-10-22: qty 237

## 2023-10-22 MED ORDER — ACETAMINOPHEN 500 MG PO TABS: 500.0000 mg | ORAL_TABLET | Freq: Four times a day (QID) | ORAL | Status: DC | PRN

## 2023-10-22 MED ORDER — TAMSULOSIN HCL 0.4 MG PO CAPS
0.4000 mg | ORAL_CAPSULE | Freq: Every day | ORAL | Status: DC
Start: 2023-10-22 — End: 2023-10-26
  Administered 2023-10-22 – 2023-10-26 (×5): 0.4 mg via ORAL
  Filled 2023-10-22 (×5): qty 1

## 2023-10-22 MED ORDER — MELATONIN 3 MG PO TABS
3.0000 mg | ORAL_TABLET | Freq: Every evening | ORAL | Status: DC | PRN
  Administered 2023-10-26: 3 mg via ORAL
  Filled 2023-10-22: qty 1

## 2023-10-22 MED ORDER — EMPAGLIFLOZIN 10 MG PO TABS
10.0000 mg | ORAL_TABLET | Freq: Every day | ORAL | Status: DC
  Administered 2023-10-23 – 2023-10-25 (×3): 10 mg via ORAL
  Filled 2023-10-22 (×3): qty 1

## 2023-10-22 MED ORDER — PANTOPRAZOLE SODIUM 20 MG PO TBEC
20.0000 mg | DELAYED_RELEASE_TABLET | Freq: Two times a day (BID) | ORAL | Status: DC
  Administered 2023-10-22 – 2023-10-26 (×8): 20 mg via ORAL
  Filled 2023-10-22 (×8): qty 1

## 2023-10-22 MED ORDER — TRAMADOL HCL 50 MG PO TABS
50.0000 mg | ORAL_TABLET | Freq: Three times a day (TID) | ORAL | Status: DC | PRN
  Administered 2023-10-22 – 2023-10-26 (×4): 50 mg via ORAL
  Filled 2023-10-22 (×4): qty 1

## 2023-10-22 MED ORDER — ACETAMINOPHEN 325 MG PO TABS
650.0000 mg | ORAL_TABLET | Freq: Four times a day (QID) | ORAL | Status: DC | PRN
Start: 2023-10-22 — End: 2023-10-26
  Administered 2023-10-24 – 2023-10-26 (×3): 650 mg via ORAL
  Filled 2023-10-22 (×3): qty 2

## 2023-10-22 MED ORDER — PHENYLEPHRINE-ACETAMINOPHEN 5-325 MG PO CAPS: 1.0000 | ORAL_CAPSULE | Freq: Three times a day (TID) | ORAL | Status: DC | PRN

## 2023-10-22 MED ORDER — CLOPIDOGREL BISULFATE 75 MG PO TABS
75.0000 mg | ORAL_TABLET | Freq: Every day | ORAL | Status: DC
  Administered 2023-10-23 – 2023-10-26 (×4): 75 mg via ORAL
  Filled 2023-10-22 (×4): qty 1

## 2023-10-22 MED ORDER — AMLODIPINE BESYLATE 10 MG PO TABS
10.0000 mg | ORAL_TABLET | Freq: Every day | ORAL | Status: DC
  Filled 2023-10-22: qty 1

## 2023-10-22 NOTE — Plan of Care (Addendum)
 Pt received to unit w/o incident. Aox4, no pain reported. VSS on 2L [place]. Dyspnea on exertion. Pt reports LBM 7/24. BLE +3 pitting. Blanchable redness to sacrum. Safety precautions in place, call light within reach. Belongings sent home w/ family.  Problem: Education: Goal: Knowledge of General Education information will improve Description: Including pain rating scale, medication(s)/side effects and non-pharmacologic comfort measures Outcome: Progressing   Problem: Health Behavior/Discharge Planning: Goal: Ability to manage health-related needs will improve Outcome: Progressing   Problem: Clinical Measurements: Goal: Ability to maintain clinical measurements within normal limits will improve Outcome: Progressing Goal: Will remain free from infection Outcome: Progressing Goal: Diagnostic test results will improve Outcome: Progressing Goal: Respiratory complications will improve Outcome: Progressing Goal: Cardiovascular complication will be avoided Outcome: Progressing   Problem: Activity: Goal: Risk for activity intolerance will decrease Outcome: Progressing   Problem: Nutrition: Goal: Adequate nutrition will be maintained Outcome: Progressing   Problem: Coping: Goal: Level of anxiety will decrease Outcome: Progressing   Problem: Elimination: Goal: Will not experience complications related to bowel motility Outcome: Progressing Goal: Will not experience complications related to urinary retention Outcome: Progressing   Problem: Pain Managment: Goal: General experience of comfort will improve and/or be controlled Outcome: Progressing   Problem: Safety: Goal: Ability to remain free from injury will improve Outcome: Progressing   Problem: Skin Integrity: Goal: Risk for impaired skin integrity will decrease Outcome: Progressing

## 2023-10-22 NOTE — ED Notes (Signed)
 CCMD notified this RN of a 6 beat run of Vtach.  EDP Patt notified.
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
## 2023-10-22 NOTE — ED Notes (Signed)
 CCMD called cardiac monitoring.
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
## 2023-10-22 NOTE — Telephone Encounter (Signed)
 Patient nurse called in after getting patient vitals for the day. Nurse sates patient has gained 6lbs in the past 2 days, has bilateral edema shortness of breathe an dan O2 reading of 88%, nurse recommended to patient and patient daughter to go to ED, patient daughter expressed she wanted to call provider first. Passed information along t o provider in office [place] PA-C  who also recommend ED, patient nurse verbalized understanding.

## 2023-10-22 NOTE — ED Provider Notes (Signed)
  Physical Exam  BP (!) 142/91   Pulse 71   Temp 98.1 F (36.7 C)   Resp (!) 22   Ht 5' 7 (1.702 m)   Wt 67.1 kg   SpO2 91%   BMI 23.18 kg/m   Physical Exam  Procedures  Procedures  ED Course / MDM    Medical Decision Making Care assumed at 4 PM.  Patient is here with shortness of breath and was noted to be borderline hypoxic with oxygen  of 90%.  Patient appears to be volume overloaded and signed out pending labs and likely admission  5 pm Initial troponin is elevated at 397.  Of note, patient did have a run of 5 beats of V. tach.  Discussed with Dr. Lonni who requests second troponin and magnesium  level.  She wants to hold off on heparin  for now as patient likely has demand ischemia  6:49 PM Second troponin is 378.  Cardiology evaluated the patient and thought it was demand ischemia.  BNP also elevated at 1600.  Recommend IV Lasix  and admission to the hospital service  CRITICAL CARE Performed by: Alm VEAR Cave   Total critical care time: 39 minutes  Critical care time was exclusive of separately billable procedures and treating other patients.  Critical care was necessary to treat or prevent imminent or life-threatening deterioration.  Critical care was time spent personally by me on the following activities: development of treatment plan with patient and/or surrogate as well as nursing, discussions with consultants, evaluation of patient's response to treatment, examination of patient, obtaining history from patient or surrogate, ordering and performing treatments and interventions, ordering and review of laboratory studies, ordering and review of radiographic studies, pulse oximetry and re-evaluation of patient's condition.   Problems Addressed: Acute on chronic systolic congestive heart failure [facility]): acute illness or injury Elevated troponin: acute illness or injury  Amount and/or Complexity of Data Reviewed Labs: ordered. Decision-making details documented in  ED Course. Radiology: ordered and independent interpretation performed. Decision-making details documented in ED Course. ECG/medicine tests: ordered and independent interpretation performed. Decision-making details documented in ED Course.  Risk OTC drugs. Prescription drug management. Decision regarding hospitalization.          Cave Alm Macho, MD 10/22/23 [phone]

## 2023-10-22 NOTE — ED Notes (Addendum)
 EDP notified of critical lab value.   Troponin 397

## 2023-10-22 NOTE — ED Provider Notes (Signed)
 [place] EMERGENCY DEPARTMENT AT [facility] Provider Note   CSN: [id number] Arrival date & time: 10/22/23  1532     Patient presents with: Shortness of Breath   [name] [name] is a 89 y.o. male.   89 yo M with a chief complaints of difficulty breathing.  Going on for couple days but worsening just prior to arrival.  Was found to be hypoxic with EMS.  Brought here for evaluation.  Patient states this feels like the last time he was here when he was told he had too much fluid on him.  Taken off lasix  to to worsening renal failure.  Denies chest pain or pressure.   Shortness of Breath      Prior to Admission medications   Medication Sig Start Date End Date Taking? Authorizing Provider  acetaminophen  (TYLENOL ) 500 MG tablet Take 500 mg by mouth every 6 (six) hours as needed for mild pain (pain score 1-3) (or headaches).    [provider]  amLODipine  (NORVASC ) 10 MG tablet Take 1 tablet (10 mg total) by mouth daily. 09/26/23   Arrien, Elidia Sieving, MD  atorvastatin  (LIPITOR) 20 MG tablet TAKE 1 TABLET(20 MG) BY MOUTH DAILY 11/16/22   Duke, Jon Garre, PA  carvedilol  (COREG ) 12.5 MG tablet Take 1 tablet (12.5 mg total) by mouth 2 (two) times daily with a meal. 09/26/23 10/26/23  Arrien, Elidia Sieving, MD  clopidogrel  (PLAVIX ) 75 MG tablet TAKE 1 TABLET BY MOUTH EVERY DAY 05/20/23   Court Dorn PARAS, MD  empagliflozin  (JARDIANCE ) 10 MG TABS tablet Take 1 tablet (10 mg total) by mouth daily before breakfast. 10/11/23   Hayes Beckey CROME, NP  feeding supplement (ENSURE PLUS HIGH PROTEIN) LIQD Take 237 mLs by mouth 2 (two) times daily between meals. 09/26/23   Arrien, Elidia Sieving, MD  furosemide  (LASIX ) 20 MG tablet Take 1 tablet (20 mg total) by mouth daily as needed (FOR SWELLING OR WEIGHT GAIN OF 3 POUNDS OVERNIGHT OR 5 POUNDS IN 1 WEEK). 10/11/23   Hayes Beckey CROME, NP  methylcellulose (ARTIFICIAL TEARS) 1 % ophthalmic solution Place 1 drop into both eyes 3 (three) times  daily as needed (for dryness).    [provider]  pantoprazole  (PROTONIX ) 20 MG tablet TAKE 1 TABLET BY MOUTH TWICE A DAY 02/08/23   Alphonsa Glendia LABOR, MD  Phenylephrine -Acetaminophen  5-325 MG CAPS Take 1 capsule by mouth every 8 (eight) hours as needed (for allergic symptoms).    [provider]  tamsulosin  (FLOMAX ) 0.4 MG CAPS capsule TAKE 1 CAPSULE EACH EVENING 07/01/23   Alphonsa Glendia LABOR, MD  traMADol  (ULTRAM ) 50 MG tablet TAKE 1 TABLET BY MOUTH EVERY 8 HOURS AS NEEDED 05/26/23   Alphonsa Glendia LABOR, MD    Allergies: Brilinta  [ticagrelor ], Influenza vaccines, and Fluoride preparations    Review of Systems  Respiratory:  Positive for shortness of breath.     Updated Vital Signs BP (!) 141/93   Pulse 71   Temp 98.1 F (36.7 C)   Resp (!) 27   Ht 5' 7 (1.702 m)   Wt 67.1 kg   SpO2 95%   BMI 23.18 kg/m   Physical Exam Vitals and nursing note reviewed.  Constitutional:      Appearance: He is well-developed.  HENT:     Head: Normocephalic and atraumatic.  Eyes:     Pupils: Pupils are equal, round, and reactive to light.  Neck:     Vascular: No JVD.  Cardiovascular:  Rate and Rhythm: Normal rate and regular rhythm.     Heart sounds: No murmur heard.    No friction rub. No gallop.  Pulmonary:     Effort: No respiratory distress.     Breath sounds: No wheezing.     Comments: Tachypnea. Abdominal:     General: There is no distension.     Tenderness: There is no abdominal tenderness. There is no guarding or rebound.  Musculoskeletal:        General: Normal range of motion.     Cervical back: Normal range of motion and neck supple.     Right lower leg: Edema present.     Left lower leg: Edema present.     Comments: 1-2+ lower extremity edema up to the shins.  Skin:    Coloration: Skin is not pale.     Findings: No rash.  Neurological:     Mental Status: He is alert and oriented to person, place, and time.  Psychiatric:        Behavior: Behavior normal.      (all labs ordered are listed, but only abnormal results are displayed) Labs Reviewed  BASIC METABOLIC PANEL WITH GFR  CBC  BRAIN NATRIURETIC PEPTIDE  TROPONIN I (HIGH SENSITIVITY)    EKG: EKG Interpretation Date/Time:  Friday October 22 2023 15:37:33 EDT Ventricular Rate:  72 PR Interval:  201 QRS Duration:  129 QT Interval:  448 QTC Calculation: 491 R Axis:   -51  Text Interpretation: Sinus rhythm Left bundle branch block No significant change since last tracing Confirmed by Emil Share [phone]) on 10/22/2023 3:53:49 PM  Radiology: No results found.   Procedures   Medications Ordered in the ED - No data to display                                  Medical Decision Making Amount and/or Complexity of Data Reviewed Labs: ordered. Radiology: ordered.   89 yo M with a chief complaint of difficulty breathing.  This feels like when he had too much fluid on him previously.  Told to stop his lasix  due to renal dysfunction.  Will obtain a laboratory evaluation.  Chest x-ray.  Reassess.  Signed out to Dr. Patt.  Please see their note for further details of care in the ED.  The patients results and plan were reviewed and discussed.   Any x-rays performed were independently reviewed by myself.   Differential diagnosis were considered with the presenting HPI.  Medications - No data to display  Vitals:   10/22/23 1538 10/22/23 1539 10/22/23 1540  BP:  (!) 141/93   Pulse:  71   Resp:  (!) 27   Temp:  98.1 F (36.7 C)   SpO2: 95% 95%   Weight:   67.1 kg  Height:   5' 7 (1.702 m)    Final diagnoses:  Acute on chronic systolic congestive heart failure (HCC)    Admission/ observation were discussed with the admitting physician, patient and/or family and they are comfortable with the plan.       Final diagnoses:  Acute on chronic systolic congestive heart failure [facility])    ED Discharge Orders     None          Emil Share, DO 10/22/23 1615

## 2023-10-22 NOTE — ED Triage Notes (Signed)
 Patient bib EMS from home with shortness of breath. Fire found patient to be 90% on room air and 2L he came up to 98%. Patient was recently taken off of his lasix  due to kidney failure.   PMH: stroke, stemi, CHF.

## 2023-10-23 ENCOUNTER — Encounter (HOSPITAL_COMMUNITY): Payer: Self-pay | Admitting: Internal Medicine

## 2023-10-23 DIAGNOSIS — I5023 Acute on chronic systolic (congestive) heart failure: Secondary | ICD-10-CM | POA: Diagnosis not present

## 2023-10-23 DIAGNOSIS — I739 Peripheral vascular disease, unspecified: Secondary | ICD-10-CM

## 2023-10-23 DIAGNOSIS — I2510 Atherosclerotic heart disease of native coronary artery without angina pectoris: Secondary | ICD-10-CM | POA: Insufficient documentation

## 2023-10-23 DIAGNOSIS — R7989 Other specified abnormal findings of blood chemistry: Secondary | ICD-10-CM | POA: Insufficient documentation

## 2023-10-23 DIAGNOSIS — I10 Essential (primary) hypertension: Secondary | ICD-10-CM | POA: Insufficient documentation

## 2023-10-23 DIAGNOSIS — N1832 Chronic kidney disease, stage 3b: Secondary | ICD-10-CM | POA: Diagnosis not present

## 2023-10-23 DIAGNOSIS — I714 Abdominal aortic aneurysm, without rupture, unspecified: Secondary | ICD-10-CM

## 2023-10-23 DIAGNOSIS — N179 Acute kidney failure, unspecified: Secondary | ICD-10-CM | POA: Insufficient documentation

## 2023-10-23 DIAGNOSIS — K219 Gastro-esophageal reflux disease without esophagitis: Secondary | ICD-10-CM

## 2023-10-23 DIAGNOSIS — I6523 Occlusion and stenosis of bilateral carotid arteries: Secondary | ICD-10-CM | POA: Insufficient documentation

## 2023-10-23 DIAGNOSIS — N400 Enlarged prostate without lower urinary tract symptoms: Secondary | ICD-10-CM

## 2023-10-23 DIAGNOSIS — I4729 Other ventricular tachycardia: Secondary | ICD-10-CM | POA: Insufficient documentation

## 2023-10-23 DIAGNOSIS — C8580 Other specified types of non-Hodgkin lymphoma, unspecified site: Secondary | ICD-10-CM

## 2023-10-23 DIAGNOSIS — Z9861 Coronary angioplasty status: Secondary | ICD-10-CM

## 2023-10-23 LAB — BASIC METABOLIC PANEL WITH GFR
Anion gap: 11 (ref 5–15)
BUN: 30 mg/dL — ABNORMAL HIGH (ref 8–23)
CO2: 27 mmol/L (ref 22–32)
Calcium: 8.3 mg/dL — ABNORMAL LOW (ref 8.9–10.3)
Chloride: 101 mmol/L (ref 98–111)
Creatinine, Ser: 1.62 mg/dL — ABNORMAL HIGH (ref 0.61–1.24)
GFR, Estimated: 41 mL/min — ABNORMAL LOW (ref >60)
Glucose, Bld: 85 mg/dL (ref 70–99)
Potassium: 3.7 mmol/L (ref 3.5–5.1)
Sodium: 139 mmol/L (ref 135–145)

## 2023-10-23 LAB — CBC WITH DIFFERENTIAL/PLATELET
Abs Immature Granulocytes: 0.02 K/uL (ref 0.00–0.07)
Basophils Absolute: 0 K/uL (ref 0.0–0.1)
Basophils Relative: 1 %
Eosinophils Absolute: 0.2 K/uL (ref 0.0–0.5)
Eosinophils Relative: 4 %
HCT: 33.3 % — ABNORMAL LOW (ref 39.0–52.0)
Hemoglobin: 10.7 g/dL — ABNORMAL LOW (ref 13.0–17.0)
Immature Granulocytes: 0 %
Lymphocytes Relative: 14 %
Lymphs Abs: 0.9 K/uL (ref 0.7–4.0)
MCH: 29.2 pg (ref 26.0–34.0)
MCHC: 32.1 g/dL (ref 30.0–36.0)
MCV: 90.7 fL (ref 80.0–100.0)
Monocytes Absolute: 0.7 K/uL (ref 0.1–1.0)
Monocytes Relative: 11 %
Neutro Abs: 4.5 K/uL (ref 1.7–7.7)
Neutrophils Relative %: 70 %
Platelets: 117 K/uL — ABNORMAL LOW (ref 150–400)
RBC: 3.67 MIL/uL — ABNORMAL LOW (ref 4.22–5.81)
RDW: 15.7 % — ABNORMAL HIGH (ref 11.5–15.5)
WBC: 6.4 K/uL (ref 4.0–10.5)
nRBC: 0 % (ref 0.0–0.2)

## 2023-10-23 LAB — MAGNESIUM: Magnesium: 2 mg/dL (ref 1.7–2.4)

## 2023-10-23 LAB — BRAIN NATRIURETIC PEPTIDE: B Natriuretic Peptide: 1300.3 pg/mL — ABNORMAL HIGH (ref 0.0–100.0)

## 2023-10-23 LAB — PHOSPHORUS: Phosphorus: 4.1 mg/dL (ref 2.5–4.6)

## 2023-10-23 MED ORDER — FUROSEMIDE 10 MG/ML IJ SOLN
40.0000 mg | Freq: Every day | INTRAMUSCULAR | Status: DC
  Administered 2023-10-24: 40 mg via INTRAVENOUS
  Filled 2023-10-23: qty 4

## 2023-10-23 MED ORDER — SPIRONOLACTONE 25 MG PO TABS
25.0000 mg | ORAL_TABLET | Freq: Every morning | ORAL | Status: DC
  Administered 2023-10-24 – 2023-10-26 (×3): 25 mg via ORAL
  Filled 2023-10-23 (×3): qty 1

## 2023-10-23 MED ORDER — SPIRONOLACTONE 25 MG PO TABS: 25.0000 mg | ORAL_TABLET | Freq: Every morning | ORAL | Status: DC

## 2023-10-23 NOTE — Plan of Care (Signed)
  Problem: Clinical Measurements: Goal: Will remain free from infection Outcome: Progressing Goal: Respiratory complications will improve Outcome: Progressing   Problem: Nutrition: Goal: Adequate nutrition will be maintained Outcome: Progressing   

## 2023-10-23 NOTE — Assessment & Plan Note (Signed)
 No acute coronary  syndrome  High sensitive troponin elevation due to heart failure.

## 2023-10-23 NOTE — Hospital Course (Signed)
 [name] was admitted to the hospital with the working diagnosis of heart failure exacerbation.   89 yo male with the past medical history of heart failure, hypertension, dyslipidemia and CKD state 3b who presented with dyspnea.  Recent hospitalization for heart failure 0705 to 10/05/23, due to non compliance.  For the last 3 to 4 days prior to hospitalization, he developed worsening dyspnea, orthopnea, PND and lower extremity edema.   07/27 patient euvolemic state, pending possible transfer to SNF.

## 2023-10-23 NOTE — Progress Notes (Signed)
  Progress Note  Patient Name: [name] [name] Date of Encounter: 10/23/2023 [facility] Cardiologist: Dorn Lesches, MD   Interval Summary   Denies anginal chest pain. Sitting upright eating breakfast and watching TV. Shortness of breath improving. Good urine output over the last 24 hours  Vital Signs Vitals:   10/23/23 0106 10/23/23 0326 10/23/23 0504 10/23/23 0745  BP: (!) 114/96 121/83 (!) 129/53 (!) 133/99  Pulse: 68  83 69  Resp: (!) 24 (!) 24  15  Temp: 98.1 F (36.7 C) 98.1 F (36.7 C) 97.9 F (36.6 C)   TempSrc: Oral Oral Oral   SpO2: 96% 91% 100% 99%  Weight:   63.8 kg   Height:        Intake/Output Summary (Last 24 hours) at 10/23/2023 0751 Last data filed at 10/23/2023 0507 Gross per 24 hour  Intake --  Output 1800 ml  Net -1800 ml      10/23/2023    5:04 AM 10/22/2023    8:48 PM 10/22/2023    3:40 PM  Last 3 Weights  Weight (lbs) 140 lb 10.5 oz 144 lb 10 oz 148 lb  Weight (kg) 63.8 kg 65.6 kg 67.132 kg      Telemetry/ECG  Sinus rhythm with PVCs- Personally Reviewed  Physical Exam  GEN: No acute distress.  Hemodynamically stable.  Appears older than stated age Neck: + JVD Cardiac: RRR, no murmurs, rubs, or gallops.  Respiratory: Clear to auscultation bilaterally.  Nasal cannula oxygen  GI: Soft, nontender, non-distended  MS: No edema, 2+ PT pulses bilaterally, warm to touch  Assessment & Plan  Acute heart failure with reduced EF Coronary disease, history of inferior infarct and anterior STEMI, prior PCI's Acute kidney injury NSVT, asymptomatic Elevated troponin secondary to demand ischemia Peripheral artery disease, denies claudication Carotid disease, history of right CIA stenting and left CIA chronic occlusion/chronic right carotid occlusion, moderate left ICA disease Abdominal aortic aneurysm Hypertension. Hyperlipidemia. History of B-cell lymphoma  Patient presents to the hospital due to worsening shortness of breath and  hypoxia.  It appears that he was holding his Lasix  secondary to renal function in the recent past which may have exacerbated the current episode.  Patient was evaluated by my partner Dr. Ladona formal consultation on 10/22/2023, please review documentation of the discussion as goals of care discussion where carried out.  Shared decision was to continue medical management for symptom control.  Patient was started on IV diuretics with close monitoring of renal function.  Serum creatinine trending up.  BNP was 1300.  Net IO Since Admission: -2,300 mL [10/23/23 0757]  Continue amlodipine  10 mg p.o. daily, hold for now Continue carvedilol  6.25 mg p.o. twice daily. Continue Jardiance  10 mg p.o. daily. Currently on Lasix  40 mg IV push twice daily, transition to 40 mg IV push daily, likely transition to p.o. tomorrow  Continue Lipitor 10 mg p.o. daily. Continue Plavix  75 mg p.o. daily Will likely transition to spironolactone  25 mg p.o. daily starting tomorrow.  Telemetry reviewed, no NSVT.  For questions or updates, please contact [facility] Please consult [email or web address] for contact info under       Signed, Madonna Large, DO, [facility]  A Division of [facility] [address], [place], [place] [address]  [place], [place] [address] 9:15 AM

## 2023-10-23 NOTE — Progress Notes (Signed)
  Progress Note   Patient: [name] [name] FMW:[id number] DOB: Feb 15, 1935 DOA: 10/22/2023     1 DOS: the patient was seen and examined on 10/23/2023   Brief hospital course: [name] [name] was admitted to the hospital with the working diagnosis of heart failure exacerbation.   88 yo male with the past medical history of heart failure, hypertension, dyslipidemia and CKD state 3b who presented with dyspnea.  Recent hospitalization for heart failure 0705 to 10/05/23, due to non compliance.  For the last 3 to 4 days prior to hospitalization, he developed worsening dyspnea, orthopnea, PND and lower extremity edema.   Assessment and Plan: * Acute on chronic systolic CHF (congestive heart failure) (HCC) Echocardiogram with reduced LV systolic function with EF 35 to 40%, moderate LVH, RV systolic function preserved, RVSP 51.8 mmHg, severe LA dilatation, small pericardial effusion. Mi.d TR.   Urine output 1,900 ml Systolic blood pressure 120 mmHg range.   Plan to continue diuresis with furosemide  40 mg daily. On spironolactone , carvedilol  and empagliflozin ,. Patient will need outpatient support to prevent re  hospitalization.   History of non sustained VT, continue with carvedilol .   CKD stage 3b, GFR 30-44 ml/min (HCC) Renal function with serum cr at 1,62 with K at 3,7 and serum bicarbonate at 27  Na 139 and Mgt 2,0   Plan to continue diuresis with furosemide , furosemide  and SGLT 2 inh Follow up renal function in am, avoid hypotension or nephrotoxic medications.   Essential hypertension Continue blood pressure control with carvedilol  Diuresis with furosemide  and spironolactone    CAD S/P percutaneous coronary angioplasty No acute coronary  syndrome  High sensitive troponin elevation due to heart failure.    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         Subjective: Patient is feeling better, today improved dyspnea and edema, continue very weak and deconditioned   Physical Exam: Vitals:   10/23/23 0326 10/23/23 0504 10/23/23 0745 10/23/23 1239  BP: 121/83 (!) 129/53 (!) 133/99 120/87  Pulse:  83 69 77  Resp: (!) 24  15 (!) 22  Temp: 98.1 F (36.7 C) 97.9 F (36.6 C) 98.2 F (36.8 C) 99 F (37.2 C)  TempSrc: Oral Oral Oral Oral  SpO2: 91% 100% 99% 95%  Weight:  63.8 kg    Height:       Neurology awake and alert ENT with mild pallor Cardiovascular with S1 and S2 present and regular with no gallops or rubs, positive systolic murmur at the right lower sternal border Respiratory with mild rales at bases with no wheezing or rhonchi.  Abdomen with no distention, soft and non tender No lower extremity edema  Data Reviewed:    Family Communication: I spoke with patient's daughter at the bedside, we talked in detail about patient's condition, plan of care and prognosis and all questions were addressed.   Disposition: Status is: Inpatient Remains inpatient appropriate because: heart failure   Planned Discharge Destination: Skilled nursing facility    Author: Elidia Toribio Furnace, MD 10/23/2023 2:55 PM  For on call review [email or web address].

## 2023-10-23 NOTE — Assessment & Plan Note (Signed)
 No signs or urinary retention, continue with tamsulosin 

## 2023-10-23 NOTE — Assessment & Plan Note (Signed)
 Continue with pantoprazole/.

## 2023-10-23 NOTE — Plan of Care (Signed)

## 2023-10-23 NOTE — Assessment & Plan Note (Signed)
 Follow up as outpatient.

## 2023-10-23 NOTE — Assessment & Plan Note (Signed)
 Continue statin therapy.

## 2023-10-23 NOTE — Assessment & Plan Note (Signed)
 No claudication, continue with statin therapy

## 2023-10-23 NOTE — Assessment & Plan Note (Signed)
 Continue blood pressure control with carvedilol.

## 2023-10-23 NOTE — Assessment & Plan Note (Addendum)
 Echocardiogram with reduced LV systolic function with EF 35 to 40%, moderate LVH, RV systolic function preserved, RVSP 51.8 mmHg, severe LA dilatation, small pericardial effusion. Mild tricuspid regurgitation.   Urine output 3,900 ml Systolic blood pressure 120 mmHg range.   Transition to po loop diuretic with torsemide  when renal function stable.  On spironolactone , carvedilol  and empagliflozin ,. Patient will need outpatient support to prevent re  hospitalization.   History of non sustained VT, continue with carvedilol .

## 2023-10-23 NOTE — Assessment & Plan Note (Signed)
 Renal function with serum cr at 1,62 with K at 3,7 and serum bicarbonate at 27  Na 139 and Mgt 2,0   Plan to continue diuresis with furosemide , furosemide  and SGLT 2 inh Follow up renal function in am, avoid hypotension or nephrotoxic medications.

## 2023-10-24 DIAGNOSIS — N179 Acute kidney failure, unspecified: Secondary | ICD-10-CM

## 2023-10-24 DIAGNOSIS — I130 Hypertensive heart and chronic kidney disease with heart failure and stage 1 through stage 4 chronic kidney disease, or unspecified chronic kidney disease: Secondary | ICD-10-CM

## 2023-10-24 DIAGNOSIS — I5023 Acute on chronic systolic (congestive) heart failure: Secondary | ICD-10-CM | POA: Diagnosis not present

## 2023-10-24 DIAGNOSIS — N1832 Chronic kidney disease, stage 3b: Secondary | ICD-10-CM | POA: Diagnosis not present

## 2023-10-24 DIAGNOSIS — I10 Essential (primary) hypertension: Secondary | ICD-10-CM | POA: Diagnosis not present

## 2023-10-24 DIAGNOSIS — I2510 Atherosclerotic heart disease of native coronary artery without angina pectoris: Secondary | ICD-10-CM | POA: Diagnosis not present

## 2023-10-24 LAB — BASIC METABOLIC PANEL WITH GFR
Anion gap: 10 (ref 5–15)
BUN: 37 mg/dL — ABNORMAL HIGH (ref 8–23)
CO2: 29 mmol/L (ref 22–32)
Calcium: 8.5 mg/dL — ABNORMAL LOW (ref 8.9–10.3)
Chloride: 103 mmol/L (ref 98–111)
Creatinine, Ser: 1.66 mg/dL — ABNORMAL HIGH (ref 0.61–1.24)
GFR, Estimated: 40 mL/min — ABNORMAL LOW (ref >60)
Glucose, Bld: 107 mg/dL — ABNORMAL HIGH (ref 70–99)
Potassium: 4 mmol/L (ref 3.5–5.1)
Sodium: 142 mmol/L (ref 135–145)

## 2023-10-24 LAB — MAGNESIUM: Magnesium: 2.2 mg/dL (ref 1.7–2.4)

## 2023-10-24 MED ORDER — TORSEMIDE 20 MG PO TABS: 10.0000 mg | ORAL_TABLET | Freq: Every day | ORAL | Status: DC

## 2023-10-24 NOTE — Progress Notes (Signed)
   10/24/23 1303  Orthostatic Sitting  BP- Sitting (!) 134/96  Orthostatic Standing at 0 minutes  BP- Standing at 0 minutes (!) 138/107  Orthostatic Standing at 3 minutes  BP- Standing at 3 minutes 103/90   Orthostatics recorded during OT eval, pt denies any symptoms throughout session. See full OT note for further details.  Latonja Bobeck C, OT  Acute Rehabilitation Services Office [phone] Secure chat preferred

## 2023-10-24 NOTE — Progress Notes (Signed)
 Progress Note  Patient Name: [name] [name] Date of Encounter: 10/24/2023 [facility] Cardiologist: Dorn Lesches, MD   Interval Summary    Denies anginal chest pain. Shortness of breath improving. Good urine output over last 24 hours  Vital Signs Vitals:   10/24/23 0348 10/24/23 0431 10/24/23 0500 10/24/23 0807  BP: (!) 143/101 (!) 159/88  (!) 149/99  Pulse: 68 67  76  Resp: (!) 25 20  (!) 24  Temp: 98.1 F (36.7 C)   98.7 F (37.1 C)  TempSrc: Axillary   Oral  SpO2: 93% 90%  92%  Weight:   63.4 kg   Height:        Intake/Output Summary (Last 24 hours) at 10/24/2023 1015 Last data filed at 10/24/2023 0900 Gross per 24 hour  Intake 720 ml  Output 3250 ml  Net -2530 ml      10/24/2023    5:00 AM 10/23/2023    5:04 AM 10/22/2023    8:48 PM  Last 3 Weights  Weight (lbs) 139 lb 12.4 oz 140 lb 10.5 oz 144 lb 10 oz  Weight (kg) 63.4 kg 63.8 kg 65.6 kg      Telemetry/ECG  Sinus rhythm - Personally Reviewed  Physical Exam  GEN: No acute distress.  Hemodynamically stable.  Appears older than stated age Neck: no JVD Cardiac: RRR, no murmurs, rubs, or gallops.  Respiratory: Clear to auscultation bilaterally.  Nasal cannula oxygen  GI: Soft, nontender, non-distended  MS: No edema, 2+ PT pulses bilaterally, warm to touch  Assessment & Plan  Acute heart failure with reduced EF Coronary disease, history of inferior infarct and anterior STEMI, prior PCI's Acute kidney injury - creatinine plateau NSVT, asymptomatic Elevated troponin secondary to demand ischemia Peripheral artery disease, denies claudication Carotid disease, history of right CIA stenting and left CIA chronic occlusion/chronic right carotid occlusion, moderate left ICA disease Abdominal aortic aneurysm Hypertension. Hyperlipidemia. History of B-cell lymphoma  Patient presents to the hospital due to worsening shortness of breath and hypoxia.  It appears that he was holding his Lasix  secondary  to renal function in the recent past which may have exacerbated the current episode leading to the hospitalization.  Patient was evaluated by my partner Dr. Ladona formal consultation on 10/22/2023, please review documentation of the discussion as goals of care discussion were carried out.  Shared decision was to continue medical management for symptom control.  Net IO Since Admission: -5,530 mL [10/24/23 1015]  Patient was started on IV diuretics with close monitoring of renal function.   Patient has developed acute kidney injury and therefore Lasix  dose was reduced yesterday. He still makes good urine output and renal function is plateauing.   NT proBNP slightly trending down  Will discontinue Lasix  for now. Once renal function improves recommend starting low-dose torsemide  either regularly or as needed based on weight.  ACE inhibitors/ARB can be considered once renal function improves as well.  Continue amlodipine  10 mg p.o. daily, hold for now Continue carvedilol  6.25 mg p.o. twice daily. Continue Jardiance  10 mg p.o. daily. Currently on Lasix  40 mg IV push daily.  Discontinued.  Will hold diuresis for now until renal function improves.  See above. Continue Lipitor 10 mg p.o. daily. Continue Plavix  75 mg p.o. daily Started Aldactone  25 mg p.o. daily  Telemetry reviewed, no NSVT.  For questions or updates, please contact [facility] Please consult [email or web address] for contact info under   [facility] will sign off.   Medication Recommendations:  see above & coordinated care with attending physician. Other recommendations (labs, testing, etc): N/A Follow up as an outpatient: Already has a scheduled follow-up appointment with Dr. Court on 11/01/2023.      Signed, Madonna Large, DO, [facility]  A Division of [facility] [address], [place], [place] [address]  [place], [place] [address] 10:15 AM

## 2023-10-24 NOTE — Evaluation (Signed)
 Physical Therapy Evaluation Patient Details Name: [name] [name] MRN: [id number] DOB: May 09, 1934 Today's Date: 10/24/2023  History of Present Illness  Pt is an 89 y.o. male admitted 7/25 for SOB. Admitted for acute on chronic systolic and diastolic heart failure. PMH: admission 7/5 for hypoxia, admission 6/27-6/29 with elevated creatinine and acute renal failure, arthritis, CHF, HTN, HLD, CAD, PVD, STEMI x2, CVA x2.   Clinical Impression  Pt in bed upon arrival of PT, agreeable to evaluation at this time. Prior to admission the pt was living at home with assist from his children for ADLs and IADLs, the pt reports independence with mobility using RW and no falls since last d/c, but is largely sedentary in bed or recliner during the day. The pt required assist to complete bed mobility and to complete controlled lower to and from chairs due to pain in RUE (chronic). The pt also presents with deficits in RLE strength limiting clearance and stride length with gait. He was dependent on BUE support on RW and limited to 43ft ambulation at this time due to fatigue and weakness. Pt reports he would like to return home with family support and resume HHPT. Will benefit from [facility] and WC to allow for improved safety with mobility and transfers with family. Will continue to follow acutely as pt is at high risk for continued falls and is presenting below his functional baseline both in strength, endurance, and stability.   HR: 82-86bpm SpO2 94-91% on RA with mobility BP: 166/96 (116) at rest before session, 162/103 (120) at rest after session, RN alerted     If plan is discharge home, recommend the following: Assistance with cooking/housework;Assistance with feeding;Direct supervision/assist for medications management;Direct supervision/assist for financial management;Assist for transportation;Help with stairs or ramp for entrance;A little help with walking and/or transfers;A little help with  bathing/dressing/bathroom   Can travel by private vehicle   Yes    Equipment Recommendations BSC/3in1;Wheelchair (measurements PT);Wheelchair cushion (measurements PT)  Recommendations for Other Services       Functional Status Assessment Patient has had a recent decline in their functional status and demonstrates the ability to make significant improvements in function in a reasonable and predictable amount of time.     Precautions / Restrictions Precautions Precautions: Fall Recall of Precautions/Restrictions: Intact Restrictions Weight Bearing Restrictions Per Provider Order: No      Mobility  Bed Mobility Overal bed mobility: Needs Assistance Bed Mobility: Supine to Sit, Sit to Supine     Supine to sit: Mod assist, HOB elevated Sit to supine: Contact guard assist   General bed mobility comments: modA to complete trunk elevation due to difficulty with use of RUE. no assist to LE    Transfers Overall transfer level: Needs assistance Equipment used: Rolling walker (2 wheels) Transfers: Sit to/from Stand, Bed to chair/wheelchair/BSC Sit to Stand: Contact guard assist   Step pivot transfers: Min assist       General transfer comment: CGA to rise, but pt dependent on bracing LE on EOB, pulling on RW despite cues to push from EOB and chair. minA to steady with turn    Ambulation/Gait Ambulation/Gait assistance: Mod assist Gait Distance (Feet): 8 Feet (+ 8 ft) Assistive device: Rolling walker (2 wheels) Gait Pattern/deviations: Decreased stride length, Decreased weight shift to left, Antalgic, Knee flexed in stance - right, Knee flexed in stance - left, Decreased dorsiflexion - right, Decreased step length - right, Step-to pattern, Decreased stance time - left Gait velocity: decreased Gait velocity interpretation: <1.31 ft/sec,  indicative of household ambulator   General Gait Details: minimal clearance with RLE, step-to with RLE behind. unable to increase clearance  or R hip flexion despite cues  Stairs Stairs:  (discussed, pt uses 1 rail and RW at home)             Balance Overall balance assessment: Needs assistance Sitting-balance support: No upper extremity supported, Feet supported Sitting balance-Leahy Scale: Fair Sitting balance - Comments: LOB with LE movements Postural control: Posterior lean Standing balance support: Bilateral upper extremity supported, During functional activity, Reliant on assistive device for balance Standing balance-Leahy Scale: Poor Standing balance comment: reliant on external support                             Pertinent Vitals/Pain Pain Assessment Pain Assessment: Faces Faces Pain Scale: Hurts even more Pain Location: R shoulder Pain Descriptors / Indicators: Discomfort, Sore Pain Intervention(s): Limited activity within patient's tolerance, Monitored during session, Repositioned, Patient requesting pain meds-RN notified    Home Living Family/patient expects to be discharged to:: Private residence Living Arrangements: Spouse/significant other;Children Available Help at Discharge: Family;Available 24 hours/day (son and daughter) Type of Home: House Home Access: Stairs to enter Entrance Stairs-Rails: Left Entrance Stairs-Number of Steps: 3   Home Layout: One level Home Equipment: Agricultural consultant (2 wheels);Shower seat;Cane - single point      Prior Function Prior Level of Function : Independent/Modified Independent;History of Falls (last six months)             Mobility Comments: pt reports using RW in the home, walking bedroom-recliner-kitchen only, limited exercise what I can do laying down. ADLs Comments: pt reports assist from children since c/d for showering and ADLs     Extremity/Trunk Assessment   Upper Extremity Assessment Upper Extremity Assessment: Defer to OT evaluation;RUE deficits/detail RUE Deficits / Details: limited ROM and strength due to pain in shoulder. pt  reports this is baseline    Lower Extremity Assessment Lower Extremity Assessment: Generalized weakness;RLE deficits/detail RLE Deficits / Details: grossly 4/5 to MMT, but functional weakness noted as pt with difficulty completing hip flexion and DF for foot clearance when standing (is able to complete in sitting)    Cervical / Trunk Assessment Cervical / Trunk Assessment: Kyphotic  Communication   Communication Communication: Impaired Factors Affecting Communication: Hearing impaired    Cognition Arousal: Alert Behavior During Therapy: WFL for tasks assessed/performed   PT - Cognitive impairments: No apparent impairments                         Following commands: Intact       Cueing Cueing Techniques: Verbal cues, Visual cues     General Comments General comments (skin integrity, edema, etc.): BP elevated but stable through session, HR in 80s, SpO2 94-91% on RA    Exercises General Exercises - Lower Extremity Hip Flexion/Marching: AROM, Both, 10 reps, Seated   Assessment/Plan    PT Assessment Patient needs continued PT services  PT Problem List Decreased strength;Decreased activity tolerance;Decreased balance;Decreased mobility;Decreased coordination;Decreased knowledge of use of DME       PT Treatment Interventions DME instruction;Gait training;Stair training;Functional mobility training;Therapeutic activities;Therapeutic exercise;Balance training;Patient/family education;Wheelchair mobility training;Manual techniques;Modalities    PT Goals (Current goals can be found in the Care Plan section)  Acute Rehab PT Goals Patient Stated Goal: to go home when stronger PT Goal Formulation: With patient Time For Goal Achievement: 11/07/23 Potential  to Achieve Goals: Fair    Frequency Min 2X/week        AM-PAC PT 6 Clicks Mobility  Outcome Measure Help needed turning from your back to your side while in a flat bed without using bedrails?: A Little Help  needed moving from lying on your back to sitting on the side of a flat bed without using bedrails?: A Lot Help needed moving to and from a bed to a chair (including a wheelchair)?: A Little Help needed standing up from a chair using your arms (e.g., wheelchair or bedside chair)?: A Little Help needed to walk in hospital room?: Total (<20 ft) Help needed climbing 3-5 steps with a railing? : A Lot 6 Click Score: 14    End of Session Equipment Utilized During Treatment: Gait belt Activity Tolerance: Patient tolerated treatment well Patient left: in bed;with call bell/phone within reach;with bed alarm set (bed in chair position, no recliner in room) Nurse Communication: Mobility status;Patient requests pain meds PT Visit Diagnosis: Unsteadiness on feet (R26.81);Muscle weakness (generalized) (M62.81)    Time: 9170-9142 PT Time Calculation (min) (ACUTE ONLY): 28 min   Charges:   PT Evaluation $PT Eval Moderate Complexity: 1 Mod PT Treatments $Therapeutic Exercise: 8-22 mins PT General Charges $$ ACUTE PT VISIT: 1 Visit         Izetta Call, PT, DPT   Acute Rehabilitation Department Office [phone] Secure Chat Communication Preferred  Izetta JULIANNA Call 10/24/2023, 9:14 AM

## 2023-10-24 NOTE — Evaluation (Signed)
 Occupational Therapy Evaluation Patient Details Name: [name] [name] MRN: [id number] DOB: 18-[name]-1935 Today's Date: 10/24/2023   History of Present Illness   Pt is an 89 y.o. male admitted 7/25 for SOB. Admitted for acute on chronic systolic and diastolic heart failure. PMH: admission 7/5 for hypoxia, admission 6/27-6/29 with elevated creatinine and acute renal failure, arthritis, CHF, HTN, HLD, CAD, PVD, STEMI x2, CVA x2     Clinical Impressions Pt admitted based on above, and was seen based on problem list below. PTA pt was receiving min to mod assistance with ADLs from children. Today pt is requiring set up  to mod assist for ADLs. Bed mobility and functional transfers are  mod. Pt slightly orthostatic during session, but denies any symptoms, see additional note for details. If family can provide appropriate assistance than pt can d/c home with HHOT. OT will continue to follow acutely to maximize functional independence.     If plan is discharge home, recommend the following:   A little help with walking and/or transfers;A lot of help with bathing/dressing/bathroom;Assistance with cooking/housework;Assist for transportation;Help with stairs or ramp for entrance     Functional Status Assessment   Patient has had a recent decline in their functional status and demonstrates the ability to make significant improvements in function in a reasonable and predictable amount of time.     Equipment Recommendations   BSC/3in1      Precautions/Restrictions   Precautions Precautions: Fall Recall of Precautions/Restrictions: Intact Precaution/Restrictions Comments: watch BP Restrictions Weight Bearing Restrictions Per Provider Order: No     Mobility Bed Mobility Overal bed mobility: Needs Assistance Bed Mobility: Supine to Sit, Sit to Supine     Supine to sit: Mod assist, HOB elevated Sit to supine: Contact guard assist   General bed mobility comments: modA to complete  trunk elevation due to difficulty with use of RUE. no assist to LE    Transfers Overall transfer level: Needs assistance Equipment used: Rolling walker (2 wheels) Transfers: Sit to/from Stand, Bed to chair/wheelchair/BSC Sit to Stand: Contact guard assist     Step pivot transfers: Min assist     General transfer comment: Min assist to come to stand, and steadying assist in standing      Balance Overall balance assessment: Needs assistance Sitting-balance support: No upper extremity supported, Feet supported Sitting balance-Leahy Scale: Fair   Postural control: Posterior lean Standing balance support: Bilateral upper extremity supported, During functional activity, Reliant on assistive device for balance Standing balance-Leahy Scale: Poor Standing balance comment: reliant on external support     ADL either performed or assessed with clinical judgement   ADL Overall ADL's : Needs assistance/impaired Eating/Feeding: Set up;Sitting   Grooming: Set up;Sitting     Upper Body Dressing : Set up;Sitting   Lower Body Dressing: Moderate assistance;Sit to/from stand Lower Body Dressing Details (indicate cue type and reason): Assist to thread legs, and pull above waist Toilet Transfer: Minimal assistance;Rolling walker (2 wheels) Toilet Transfer Details (indicate cue type and reason): Simulated in room with lateral steps toward [facility] Toileting- Clothing Manipulation and Hygiene: Moderate assistance;Sit to/from stand         Vision Baseline Vision/History: 0 No visual deficits Patient Visual Report: No change from baseline Vision Assessment?: No apparent visual deficits            Pertinent Vitals/Pain Pain Assessment Faces Pain Scale: Hurts even more Pain Location: R shoulder Pain Descriptors / Indicators: Discomfort, Sore     Extremity/Trunk Assessment Upper Extremity Assessment  Upper Extremity Assessment: Generalized weakness;RUE deficits/detail;LUE  deficits/detail RUE Deficits / Details: pt reports baseline bil arthritis R>L decreased bil shoulder flex < 60 degrees RUE: Shoulder pain with ROM RUE Coordination: decreased fine motor;decreased gross motor LUE Deficits / Details: pt reports baseline bil arthritis R>L decreased bil shoulder flex < 60 degrees LUE: Shoulder pain with ROM LUE Coordination: decreased fine motor;decreased gross motor   Lower Extremity Assessment Lower Extremity Assessment: Defer to PT evaluation   Cervical / Trunk Assessment Cervical / Trunk Assessment: Kyphotic   Communication Communication Communication: Impaired Factors Affecting Communication: Hearing impaired   Cognition Arousal: Alert Behavior During Therapy: WFL for tasks assessed/performed       Following commands: Intact       Cueing  General Comments   Cueing Techniques: Verbal cues;Visual cues  Slightly orthostatic, see additional note for measurements. RN and MD notified           Home Living Family/patient expects to be discharged to:: Private residence Living Arrangements: Spouse/significant other;Children Available Help at Discharge: Family;Available 24 hours/day (son and daughter) Type of Home: House Home Access: Stairs to enter [employer] of Steps: 3 Entrance Stairs-Rails: Left Home Layout: One level     Bathroom Shower/Tub: Sponge bathes at baseline   Bathroom Toilet: Handicapped height Bathroom Accessibility: Yes   Home Equipment: Agricultural consultant (2 wheels);Shower seat;Cane - single point          Prior Functioning/Environment Prior Level of Function : Independent/Modified Independent;History of Falls (last six months)     Mobility Comments: pt reports using RW in the home, walking bedroom-recliner-kitchen only, limited exercise what I can do laying down. ADLs Comments: pt reports assist from children since c/d for showering and ADLs    OT Problem List: Decreased strength;Decreased activity  tolerance;Impaired balance (sitting and/or standing);Cardiopulmonary status limiting activity   OT Treatment/Interventions: Self-care/ADL training;Therapeutic exercise;Energy conservation;DME and/or AE instruction;Therapeutic activities;Patient/family education;Balance training      OT Goals(Current goals can be found in the care plan section)   Acute Rehab OT Goals Patient Stated Goal: To go home OT Goal Formulation: With patient Time For Goal Achievement: 11/07/23 Potential to Achieve Goals: Good   OT Frequency:  Min 2X/week       AM-PAC OT 6 Clicks Daily Activity     Outcome Measure Help from another person eating meals?: None Help from another person taking care of personal grooming?: A Little Help from another person toileting, which includes using toliet, bedpan, or urinal?: A Lot Help from another person bathing (including washing, rinsing, drying)?: A Lot Help from another person to put on and taking off regular upper body clothing?: A Little Help from another person to put on and taking off regular lower body clothing?: A Lot 6 Click Score: 16   End of Session Equipment Utilized During Treatment: Gait belt;Rolling walker (2 wheels) Nurse Communication: Mobility status  Activity Tolerance: Patient limited by fatigue Patient left: in bed;with call bell/phone within reach;with bed alarm set  OT Visit Diagnosis: Unsteadiness on feet (R26.81);Repeated falls (R29.6);Other abnormalities of gait and mobility (R26.89);Muscle weakness (generalized) (M62.81)                Time: 8694-8672 OT Time Calculation (min): 22 min Charges:  OT General Charges $OT Visit: 1 Visit OT Evaluation $OT Eval Moderate Complexity: 1 Mod  Adrianne BROCKS, OT  Acute Rehabilitation Services Office [phone] Secure chat preferred   Adrianne GORMAN Savers 10/24/2023, 2:52 PM

## 2023-10-24 NOTE — Progress Notes (Signed)
 Pharmacist Heart Failure Core Measure Documentation  Assessment: [name] [name] has an EF documented as 35% by ECHO.  Rationale: Heart failure patients with left ventricular systolic dysfunction (LVSD) and an EF < 40% should be prescribed an angiotensin converting enzyme inhibitor (ACEI) or angiotensin receptor blocker (ARB) at discharge unless a contraindication is documented in the medical record.  This patient is not currently on an ACEI or ARB for HF.  This note is being placed in the record in order to provide documentation that a contraindication to the use of these agents is present for this encounter.  ACE Inhibitor or Angiotensin Receptor Blocker is contraindicated (specify all that apply)  []   ACEI allergy AND ARB allergy []   Angioedema []   Moderate or severe aortic stenosis []   Hyperkalemia []   Hypotension []   Renal artery stenosis [x]   Worsening renal function, preexisting renal disease or dysfunction   Olam Chalk Pharm.D. CPP, BCPS Clinical Pharmacist [phone] 10/24/2023 10:13 PM

## 2023-10-24 NOTE — Progress Notes (Signed)
  Progress Note   Patient: [name] [name] FMW:[id number] DOB: Mar 09, 1935 DOA: 10/22/2023     2 DOS: the patient was seen and examined on 10/24/2023   Brief hospital course: [name] [name] was admitted to the hospital with the working diagnosis of heart failure exacerbation.   89 yo male with the past medical history of heart failure, hypertension, dyslipidemia and CKD state 3b who presented with dyspnea.  Recent hospitalization for heart failure 0705 to 10/05/23, due to non compliance.  For the last 3 to 4 days prior to hospitalization, he developed worsening dyspnea, orthopnea, PND and lower extremity edema.   07/27 patient euvolemic state, pending possible transfer to SNF.   Assessment and Plan: * Acute on chronic systolic CHF (congestive heart failure) (HCC) Echocardiogram with reduced LV systolic function with EF 35 to 40%, moderate LVH, RV systolic function preserved, RVSP 51.8 mmHg, severe LA dilatation, small pericardial effusion. Mild tricuspid regurgitation.   Urine output 3,900 ml Systolic blood pressure 120 mmHg range.   Transition to po loop diuretic with torsemide  when renal function stable.  On spironolactone , carvedilol  and empagliflozin ,. Patient will need outpatient support to prevent re  hospitalization.   History of non sustained VT, continue with carvedilol .   CKD stage 3b, GFR 30-44 ml/min (HCC) AKI   Today serum cr is 1,66 with K at 4,0 and serum bicarbonate at 29  Na 142 and Mg 2.2   Continue spironolactone  and SGLT 2 inh.  Follow up renal function in am, avoid hypotension or nephrotoxic medications.  Possible transition to po loop diuretic in am.   Essential hypertension Continue blood pressure control with carvedilol  Diuresis with SGLT 2 inh and spironolactone    CAD S/P percutaneous coronary angioplasty No acute coronary  syndrome  High sensitive troponin elevation due to heart failure.    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         Subjective: Patient with no chest pain or dyspnea, weak and deconditioned, not back to his baseline.   Physical Exam: Vitals:   10/24/23 0500 10/24/23 0807 10/24/23 1100 10/24/23 1303  BP:  (!) 149/99 (!) 133/92 121/86  Pulse:  76 75 78  Resp:  (!) 24 (!) 28 (!) 30  Temp:  98.7 F (37.1 C) 98.9 F (37.2 C) 99.9 F (37.7 C)  TempSrc:  Oral Oral   SpO2:  92% 91% 91%  Weight: 63.4 kg     Height:       Neurology awake and alert ENT with mild pallor Cardiovascular with S1 and S2 present and regular with no gallops, or rubs. Positive systolic murmur at the right lower sternal border No JVD No lower extremity edema Respiratory with no rales or wheezing, no rhonchi  Abdomen with no distention  Data Reviewed:    Family Communication: no family at the bedside   Disposition: Status is: Inpatient Remains inpatient appropriate because: possible transfer to SNF   Planned Discharge Destination: Skilled nursing facility    Author: Elidia Toribio Furnace, MD 10/24/2023 2:40 PM  For on call review [email or web address].

## 2023-10-25 LAB — RENAL FUNCTION PANEL
Albumin: 2.9 g/dL — ABNORMAL LOW (ref 3.5–5.0)
Anion gap: 11 (ref 5–15)
BUN: 38 mg/dL — ABNORMAL HIGH (ref 8–23)
CO2: 26 mmol/L (ref 22–32)
Calcium: 8.3 mg/dL — ABNORMAL LOW (ref 8.9–10.3)
Chloride: 99 mmol/L (ref 98–111)
Creatinine, Ser: 1.83 mg/dL — ABNORMAL HIGH (ref 0.61–1.24)
GFR, Estimated: 35 mL/min — ABNORMAL LOW (ref >60)
Glucose, Bld: 104 mg/dL — ABNORMAL HIGH (ref 70–99)
Phosphorus: 3.7 mg/dL (ref 2.5–4.6)
Potassium: 4.1 mmol/L (ref 3.5–5.1)
Sodium: 136 mmol/L (ref 135–145)

## 2023-10-25 NOTE — [place] FL2 (Signed)
 [place]  MEDICAID FL2 LEVEL OF CARE FORM     IDENTIFICATION  Patient Name: [name] [name] Birthdate: 1934/10/20 Sex: male Admission Date (Current Location): 10/22/2023  [facility] and [place] Number:  Producer, television/film/video and Address:  The [place], 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) 10/24/2023   Elevated troponin 10/23/2023   NSVT (nonsustained ventricular tachycardia) (HCC) 10/23/2023   Bilateral carotid artery stenosis 10/23/2023   Benign hypertension 10/23/2023   PAD (peripheral artery disease) (HCC) 10/23/2023   AKI (acute kidney injury) (HCC) 10/23/2023   Atherosclerosis of native coronary artery of native heart without angina pectoris 10/23/2023   Acute on chronic combined systolic and diastolic heart failure (HCC) 10/22/2023   Acute exacerbation of CHF (congestive heart failure) (HCC) 10/03/2023   Acute hypoxic respiratory failure (HCC) 10/03/2023   Ground-level fall 10/03/2023   ARF (acute renal failure) (HCC) 09/24/2023   Essential hypertension 09/24/2023   Acute on chronic systolic CHF (congestive heart failure) (HCC) 09/24/2023   Renal failure 09/24/2023   Moderately severe major depression (HCC) 05/24/2023   CKD stage 3b, GFR 30-44 ml/min (HCC) 06/19/2022   History of heart artery stent 01/07/2022   Knee pain 01/07/2022   Hearing loss 01/07/2022   Benign essential hypertension 01/07/2022   Generalized weakness 09/26/2021    UTI (urinary tract infection) 09/26/2021   GERD (gastroesophageal reflux disease)    Acute cystitis    Sepsis (HCC) 09/25/2021   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   Acute blood loss anemia    GI bleed 09/16/2020   Diverticulosis 09/16/2020   Rectal bleeding    Anemia    Stenosis of left subclavian artery (HCC) 08/22/2020   CHF (congestive heart failure) (HCC) 08/15/2020   Hypertensive urgency 02/20/2020   Chronic combined systolic and diastolic heart failure (HCC) 02/19/2020   Carpal tunnel syndrome of right wrist 04/12/2018   Thrombocytopenia (HCC) 03/21/2018   Aftercare 03/08/2018   Ulnar neuropathy of left upper extremity 01/24/2018   Ulnar neuropathy of right upper extremity 01/24/2018   Bilateral carpal tunnel syndrome 01/24/2018   Bilateral shoulder pain 01/11/2018   Cervical spine pain 01/11/2018   Pain in left knee 01/11/2018   Pain in thoracic spine 01/11/2018   Asymptomatic carotid artery stenosis 11/27/2015   Carpal tunnel syndrome 06/12/2015   Special screening for malignant neoplasms, colon 11/08/2014   Encounter for long-term (current) use of antiplatelets/antithrombotics 11/08/2014   Anemia, iron deficiency 06/04/2014   Cerebral infarction due to stenosis of right carotid artery (HCC) 04/12/2014   Carotid occlusion, left 04/12/2014   PVD (peripheral vascular disease) (HCC) 04/12/2014   Coronary artery disease involving native coronary artery of native heart with angina pectoris (HCC) 04/12/2014   B12 deficiency 04/12/2014   Cerebral infarction [facility])    Cerebral infarction (HCC) 03/26/2014   Stroke (HCC) 03/26/2014   BPH (benign prostatic hyperplasia) 11/28/2013   Claudication (HCC) 09/04/2013   Pre-op exam 08/17/2013   SOB (shortness of breath)  08/17/2013   CAD S/P percutaneous coronary angioplasty 07/25/2013   AAA (abdominal aortic aneurysm) (HCC) 07/25/2013   STEMI (ST elevation myocardial infarction) (HCC) 07/02/2013   Peripheral vascular disease  (HCC) 07/02/2013   Occlusion and stenosis of carotid artery with cerebral infarction 10/25/2012   HLD (hyperlipidemia) 09/06/2012   Hypertension 09/06/2012   Arthritis 09/06/2012   History of stroke 09/06/2012    Orientation RESPIRATION BLADDER Height & Weight     Self, Time, Situation, Place  Normal External catheter, Incontinent Weight: 139 lb 12.4 oz (63.4 kg) Height:  5' 9 (175.3 cm)  BEHAVIORAL SYMPTOMS/MOOD NEUROLOGICAL BOWEL NUTRITION STATUS      Continent Diet (see dc summary)  AMBULATORY STATUS COMMUNICATION OF NEEDS Skin   Extensive Assist Verbally Normal                       Personal Care Assistance Level of Assistance  Bathing, Feeding, Dressing Bathing Assistance: Limited assistance Feeding assistance: Limited assistance Dressing Assistance: Limited assistance     Functional Limitations Info  Sight, Hearing, Speech Sight Info: Adequate Hearing Info: Impaired Speech Info: Adequate    SPECIAL CARE FACTORS FREQUENCY  PT (By licensed PT), OT (By licensed OT)     PT Frequency: 5x week OT Frequency: 5x week            Contractures Contractures Info: Not present    Additional Factors Info  Code Status, Allergies Code Status Info: DNR Limited Allergies Info: Brilinta  (Ticagrelor ), Influenza Vaccines, Nsaids, Fluoride Preparations           Current Medications (10/25/2023):  This is the current hospital active medication list Current Facility-Administered Medications  Medication Dose Route Frequency Provider Last Rate Last Admin   acetaminophen  (TYLENOL ) tablet 650 mg  650 mg Oral Q6H PRN Howerter, Justin B, DO   650 mg at 10/24/23 2139   Or   acetaminophen  (TYLENOL ) suppository 650 mg  650 mg Rectal Q6H PRN Howerter, Justin B, DO       artificial tears ophthalmic solution 1 drop  1 drop Both Eyes TID PRN Chen, Lydia D, RPH       atorvastatin  (LIPITOR) tablet 10 mg  10 mg Oral Daily Ganji, Jay, MD   10 mg at 10/25/23 9180   carvedilol  (COREG )  tablet 6.25 mg  6.25 mg Oral BID WC Ganji, Jay, MD   6.25 mg at 10/25/23 9180   clopidogrel  (PLAVIX ) tablet 75 mg  75 mg Oral Daily Ganji, Jay, MD   75 mg at 10/25/23 0819   feeding supplement (ENSURE PLUS HIGH PROTEIN) liquid 237 mL  237 mL Oral BID BM Ladona Heinz, MD   237 mL at 10/24/23 0915   melatonin tablet 3 mg  3 mg Oral QHS PRN Howerter, Justin B, DO       ondansetron  (ZOFRAN ) injection 4 mg  4 mg Intravenous Q6H PRN Howerter, Justin B, DO       pantoprazole  (PROTONIX ) EC tablet 20 mg  20 mg Oral BID Ganji, Jay, MD   20 mg at 10/25/23 9180   spironolactone  (ALDACTONE ) tablet 25 mg  25 mg Oral q morning Tolia, Sunit, DO   25 mg at 10/25/23 9180   tamsulosin  (FLOMAX ) capsule 0.4 mg  0.4 mg Oral QPC supper Ladona Heinz, MD   0.4 mg at 10/24/23 1733   traMADol  (ULTRAM ) tablet 50 mg  50 mg Oral Q8H PRN Ladona Heinz, MD   50 mg at 10/24/23 [phone]  Discharge Medications: Please see discharge summary for a list of discharge medications.  Relevant Imaging Results:  Relevant Lab Results:   Additional Information SSN [id number]  Luise JAYSON Pan, LCSWA

## 2023-10-25 NOTE — Progress Notes (Signed)
 Physical Therapy Treatment Patient Details Name: [name] [name] MRN: [id number] DOB: 27-May-1934 Today's Date: 10/25/2023   History of Present Illness Pt is an 89 y.o. male admitted 7/25 for SOB. Admitted for acute on chronic systolic and diastolic heart failure. PMH: admission 7/5 for hypoxia, admission 6/27-6/29 with elevated creatinine and acute renal failure, arthritis, CHF, HTN, HLD, CAD, PVD, STEMI x2, CVA x2    PT Comments  Pt making slow, steady progress but still requiring significant assist to amb very short distance in room. Family not able to provide this level of assist at home. Patient will benefit from continued inpatient follow up therapy, <3 hours/day prior to eventual return home.     If plan is discharge home, recommend the following: Assistance with cooking/housework;Assistance with feeding;Direct supervision/assist for medications management;Direct supervision/assist for financial management;Assist for transportation;Help with stairs or ramp for entrance;A little help with walking and/or transfers;A little help with bathing/dressing/bathroom   Can travel by private vehicle     Yes  Equipment Recommendations  BSC/3in1;Wheelchair (measurements PT);Wheelchair cushion (measurements PT)    Recommendations for Other Services       Precautions / Restrictions Precautions Precautions: Fall Recall of Precautions/Restrictions: Intact Restrictions Weight Bearing Restrictions Per Provider Order: No     Mobility  Bed Mobility Overal bed mobility: Needs Assistance Bed Mobility: Supine to Sit, Sit to Supine     Supine to sit: HOB elevated, Min assist Sit to supine: Contact guard assist   General bed mobility comments: Assist to elevate trunk and bring hips to EOB    Transfers Overall transfer level: Needs assistance Equipment used: Rolling walker (2 wheels) Transfers: Sit to/from Stand, Bed to chair/wheelchair/BSC Sit to Stand: Min assist, Mod assist            General transfer comment: Min assist to power up and balance. Mod assist to control descent to chair. Chair to bed with min assist at end of session with pt using bed rail.    Ambulation/Gait Ambulation/Gait assistance: Mod assist Gait Distance (Feet): 15 Feet Assistive device: Rolling walker (2 wheels) Gait Pattern/deviations: Decreased stride length, Decreased weight shift to left, Decreased dorsiflexion - right, Decreased step length - right, Step-to pattern, Decreased stance time - left, Knee flexed in stance - right, Knee flexed in stance - left (rt lateral lean) Gait velocity: decreased Gait velocity interpretation: <1.31 ft/sec, indicative of household ambulator   General Gait Details: Assist with balance and support. Pt with rt lateral lean.   Stairs             Wheelchair Mobility     Tilt Bed    Modified Rankin (Stroke Patients Only)       Balance Overall balance assessment: Needs assistance Sitting-balance support: Feet supported, Bilateral upper extremity supported Sitting balance-Leahy Scale: Poor Sitting balance - Comments: rt lateral lean with min assist to correct Postural control: Posterior lean Standing balance support: Bilateral upper extremity supported, During functional activity, Reliant on assistive device for balance Standing balance-Leahy Scale: Poor Standing balance comment: walker and min to mod assist for static standing                            Communication Communication Communication: Impaired Factors Affecting Communication: Hearing impaired  Cognition Arousal: Alert Behavior During Therapy: WFL for tasks assessed/performed   PT - Cognitive impairments: No apparent impairments  Following commands: Intact      Cueing Cueing Techniques: Verbal cues, Visual cues  Exercises      General Comments        Pertinent Vitals/Pain Pain Assessment Pain Assessment: Faces Faces Pain  Scale: Hurts little more Pain Location: R shoulder Pain Descriptors / Indicators: Discomfort, Sore Pain Intervention(s): Limited activity within patient's tolerance    Home Living                          Prior Function            PT Goals (current goals can now be found in the care plan section) Acute Rehab PT Goals Patient Stated Goal: get stronger Progress towards PT goals: Progressing toward goals    Frequency    Min 2X/week      PT Plan      Co-evaluation              AM-PAC PT 6 Clicks Mobility   Outcome Measure  Help needed turning from your back to your side while in a flat bed without using bedrails?: A Little Help needed moving from lying on your back to sitting on the side of a flat bed without using bedrails?: A Little Help needed moving to and from a bed to a chair (including a wheelchair)?: A Little Help needed standing up from a chair using your arms (e.g., wheelchair or bedside chair)?: A Little Help needed to walk in hospital room?: Total (<20 ft) Help needed climbing 3-5 steps with a railing? : Total 6 Click Score: 14    End of Session Equipment Utilized During Treatment: Gait belt Activity Tolerance: Patient tolerated treatment well Patient left: in bed;with call bell/phone within reach;with bed alarm set Nurse Communication: Mobility status PT Visit Diagnosis: Unsteadiness on feet (R26.81);Muscle weakness (generalized) (M62.81)     Time: 8594-8573 PT Time Calculation (min) (ACUTE ONLY): 21 min  Charges:    $Gait Training: 8-22 mins PT General Charges $$ ACUTE PT VISIT: 1 Visit                     [facility] PT Acute Rehabilitation Services Office [phone]    [place] ORN [facility] 10/25/2023, 2:39 PM

## 2023-10-25 NOTE — Plan of Care (Signed)

## 2023-10-25 NOTE — Progress Notes (Signed)
 Heart Failure Navigator Progress Note  Assessed for Heart & Vascular TOC clinic readiness.  Patient does not meet criteria due to patient has a scheduled [facility] appointment on 11/01/23. No HF TOC per Dr. Noralee. .   Navigator will sign off at this time.   Stephane Haddock, BSN, Scientist, clinical (histocompatibility and immunogenetics) Only

## 2023-10-25 NOTE — Progress Notes (Signed)
 Progress Note   Patient: [name] [name] FMW:[id number] DOB: [birth year] DOA: 10/22/2023     3 DOS: the patient was seen and examined on 10/25/2023   Brief hospital course: [name] was admitted to the hospital with the working diagnosis of heart failure exacerbation.   89 yo male with the past medical history of heart failure, hypertension, dyslipidemia and CKD state 3b who presented with dyspnea.  Recent hospitalization for heart failure 07/05 to 10/05/23, due to non compliance.  For the last 3 to 4 days prior to hospitalization, he developed worsening dyspnea, orthopnea, PND and lower extremity edema.On his initial physical examination his blood pressure was 141/93, HR 71, RR 27 and 02 saturation 91%. Lungs with no wheezing or rhonchi, heart with S1 and S2 present and regular with no gallops, rubs or murmurs, abdomen with no distention and positive lower extremity edema ++    Na 136, K 4.9 Cl 105 bicarbonate 22, glucose 98, bun 30 cr 1,37  BNP 1,604  High sensitive troponin 397 and 378  Wbc 8,4 hgb 10.9 plt 129   Chest radiograph with cardiomegaly, bilateral hilar vascular congestion, with small bilateral pleural effusions, fluid in the right fissure.   EKG 72 bpm, left axis deviation, left bundle branch block, qtc 491, sinus rhythm with no significant ST segment or T wave changes. Positive LVH, poor RR wave progression.   Patient placed on furosemide  for diuresis.   07/27 patient euvolemic state, pending possible transfer to SNF.  07/28 PT and OT re evaluation, possible transfer to SNF.   Assessment and Plan: * Acute on chronic systolic CHF (congestive heart failure) (HCC) Echocardiogram with reduced LV systolic function with EF 35 to 40%, moderate LVH, RV systolic function preserved, RVSP 51.8 mmHg, severe LA dilatation, small pericardial effusion. Mild tricuspid regurgitation.   Urine output 3,000 ml Systolic blood pressure 120 mmHg range.   Continue with spironolactone   and carvedilol  Hold on furosemide  and empagliflozin  until renal function more stable,. Patient will need outpatient support to prevent re  hospitalization.   History of non sustained VT, continue with carvedilol .   CKD stage 3b, GFR 30-44 ml/min (HCC) AKI   Worsening renal function with serum cr at 1,83 with K at 4,1 and serum bicarbonate at 26  Na 136 P 3.7   Hold on diuretic therapy, furosemide  and SGLT 2 inh Continue spironolactone  for now.  Follow up renal function in am, avoid hypotension or nephrotoxic medications.  Possible transition to po loop diuretic in am.   Essential hypertension Continue blood pressure control with carvedilol   CAD S/P percutaneous coronary angioplasty No acute coronary  syndrome  High sensitive troponin elevation due to heart failure.    HLD (hyperlipidemia) Continue statin therapy   PAD (peripheral artery disease) (HCC) No claudication, continue with statin therapy   BPH (benign prostatic hyperplasia) No signs or urinary retention, continue with tamsulosin    Large cell lymphoma (HCC) Follow up as outpatient   GERD (gastroesophageal reflux disease) Continue with pantoprazole .       Subjective: Patient with no chest pain or dyspnea, no PND, orthopnea or lower extremity edema. Continue very weak and deconditioned   Physical Exam: Vitals:   10/25/23 0000 10/25/23 0005 10/25/23 0744 10/25/23 1151  BP:  (!) 142/89 (!) 165/117 (!) 138/96  Pulse:   88 72  Resp: (!) 24 (!) 22 (!) 24 18  Temp: 99 F (37.2 C)  99.6 F (37.6 C) 98.5 F (36.9 C)  TempSrc: Oral  Oral Oral  SpO2:   90% 94%  Weight:      Height:       Neurology awake and alert ENT with mild pallor Cardiovascular with S1 and S2 present and regular with no gallops or rubs, no murmurs Respiratory with no rales or wheezing, no rhonchi  Abdomen with no distention  No lower extremity edema  Data Reviewed:    Family Communication: no family at the bedside    Disposition: Status is: Inpatient Remains inpatient appropriate because: possible transfer to SNF   Planned Discharge Destination: Skilled nursing facility    Author: Elidia Toribio Furnace, MD 10/25/2023 2:16 PM  For on call review [email or web address].

## 2023-10-25 NOTE — Progress Notes (Signed)
 OT Cancellation Note  Patient Details Name: [name] [name] MRN: [id number] DOB: November 04, 1934   Cancelled Treatment:    Reason Eval/Treat Not Completed: Fatigue/lethargy limiting ability to participate Attempted to see pt for skilled OT treatment session this afternoon. Pt sleeping upon therapy arrival and unable to participate in therapy at this time. Will attempt to complete OT session at a later date when able.   Leita [name], OTR/L,CBIS  Supplemental OT - [facility] and [facility] Secure Chat Preferred   10/25/2023, 4:43 PM

## 2023-10-25 NOTE — TOC Initial Note (Signed)
 Transition of Care [facility]) - Initial/Assessment Note    Patient Details  Name: [name] [name] MRN: [id number] Date of Birth: [birth year]  [facility] CM/SW Contact:    Luise JAYSON Pan, LCSWA Phone Number: 10/25/2023, 2:28 PM  Clinical Narrative:    Patients daughter, [name], would like patient to go to SNF. PT recs are currently HH. CSW spoke with patient and he is okay with seeing the bed offers for SNF before making a final decision.   TOC will continue to follow.     Expected Discharge Plan: Skilled Nursing Facility Barriers to Discharge: Continued Medical Work up, SNF Pending bed offer, Insurance Authorization   Patient Goals and CMS Choice Patient states their goals for this hospitalization and ongoing recovery are:: To go home          Expected Discharge Plan and Services In-house Referral: Clinical Social Work Discharge Planning Services: CM Consult   Living arrangements for the past 2 months: Skilled Nursing Facility                                      Prior Living Arrangements/Services Living arrangements for the past 2 months: Skilled Nursing Facility Lives with:: Spouse Patient language and need for interpreter reviewed:: Yes Do you feel safe going back to the place where you live?: Yes      Need for Family Participation in Patient Care: Yes (Comment) Care giver support system in place?: Yes (comment) Current home services: DME (rolling walker, cane, shower seat) Criminal Activity/Legal Involvement Pertinent to Current Situation/Hospitalization: No - Comment as needed  Activities of Daily Living   ADL Screening (condition at time of admission) Independently performs ADLs?: No Does the patient have a NEW difficulty with bathing/dressing/toileting/self-feeding that is expected to last >3 days?: Yes (Initiates electronic notice to provider for possible OT consult) Does the patient have a NEW difficulty with getting in/out of bed,  walking, or climbing stairs that is expected to last >3 days?: Yes (Initiates electronic notice to provider for possible PT consult) Does the patient have a NEW difficulty with communication that is expected to last >3 days?: No Is the patient deaf or have difficulty hearing?: Yes Does the patient have difficulty seeing, even when wearing glasses/contacts?: No Does the patient have difficulty concentrating, remembering, or making decisions?: No  Permission Sought/Granted Permission sought to share information with : Facility Medical sales representative                Emotional Assessment Appearance:: Appears stated age Attitude/Demeanor/Rapport: Engaged Affect (typically observed): Appropriate Orientation: : Oriented to Self, Oriented to Place, Oriented to  Time, Oriented to Situation Alcohol  / Substance Use: Not Applicable Psych Involvement: No (comment)  Admission diagnosis:  Acute on chronic combined systolic and diastolic heart failure (HCC) [I50.43] Elevated troponin [R79.89] Acute on chronic systolic congestive heart failure (HCC) [I50.23] Patient Active Problem List   Diagnosis Date Noted   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) 10/24/2023   Elevated troponin 10/23/2023   NSVT (nonsustained ventricular tachycardia) (HCC) 10/23/2023   Bilateral carotid artery stenosis 10/23/2023   Benign hypertension 10/23/2023   PAD (peripheral artery disease) (HCC) 10/23/2023   AKI (acute kidney injury) (HCC) 10/23/2023   Atherosclerosis of native coronary artery of native heart without angina pectoris 10/23/2023   Acute on chronic combined systolic and diastolic heart failure (HCC) 10/22/2023  Acute exacerbation of CHF (congestive heart failure) (HCC) 10/03/2023   Acute hypoxic respiratory failure (HCC) 10/03/2023   Ground-level fall 10/03/2023   ARF (acute renal failure) (HCC) 09/24/2023   Essential hypertension 09/24/2023   Acute on chronic systolic  CHF (congestive heart failure) (HCC) 09/24/2023   Renal failure 09/24/2023   Moderately severe major depression (HCC) 05/24/2023   CKD stage 3b, GFR 30-44 ml/min (HCC) 06/19/2022   History of heart artery stent 01/07/2022   Knee pain 01/07/2022   Hearing loss 01/07/2022   Benign essential hypertension 01/07/2022   Generalized weakness 09/26/2021   UTI (urinary tract infection) 09/26/2021   GERD (gastroesophageal reflux disease)    Acute cystitis    Sepsis (HCC) 09/25/2021   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   Acute blood loss anemia    GI bleed 09/16/2020   Diverticulosis 09/16/2020   Rectal bleeding    Anemia    Stenosis of left subclavian artery (HCC) 08/22/2020   CHF (congestive heart failure) (HCC) 08/15/2020   Hypertensive urgency 02/20/2020   Chronic combined systolic and diastolic heart failure (HCC) 02/19/2020   Carpal tunnel syndrome of right wrist 04/12/2018   Thrombocytopenia (HCC) 03/21/2018   Aftercare 03/08/2018   Ulnar neuropathy of left upper extremity 01/24/2018   Ulnar neuropathy of right upper extremity 01/24/2018   Bilateral carpal tunnel syndrome 01/24/2018   Bilateral shoulder pain 01/11/2018   Cervical spine pain 01/11/2018   Pain in left knee 01/11/2018   Pain in thoracic spine 01/11/2018   Asymptomatic carotid artery stenosis 11/27/2015   Carpal tunnel syndrome 06/12/2015   Special screening for malignant neoplasms, colon 11/08/2014   Encounter for long-term (current) use of antiplatelets/antithrombotics 11/08/2014   Anemia, iron deficiency 06/04/2014   Cerebral infarction due to stenosis of right carotid artery (HCC) 04/12/2014   Carotid occlusion, left 04/12/2014   PVD (peripheral vascular disease) (HCC) 04/12/2014   Coronary artery disease involving native coronary artery of native heart with angina pectoris (HCC) 04/12/2014   B12 deficiency 04/12/2014   Cerebral infarction [facility])    Cerebral infarction (HCC) 03/26/2014   Stroke (HCC)  03/26/2014   BPH (benign prostatic hyperplasia) 11/28/2013   Claudication (HCC) 09/04/2013   Pre-op exam 08/17/2013   SOB (shortness of breath) 08/17/2013   CAD S/P percutaneous coronary angioplasty 07/25/2013   AAA (abdominal aortic aneurysm) (HCC) 07/25/2013   STEMI (ST elevation myocardial infarction) (HCC) 07/02/2013   Peripheral vascular disease (HCC) 07/02/2013   Occlusion and stenosis of carotid artery with cerebral infarction 10/25/2012   HLD (hyperlipidemia) 09/06/2012   Hypertension 09/06/2012   Arthritis 09/06/2012   History of stroke 09/06/2012   PCP:  Alphonsa Glendia LABOR, MD Pharmacy:   [id number] - [place], [place] - [address] [place] [address] Phone: [phone] Fax: [phone]  [facility] [address] [place] [address] Phone: [phone] Fax: [phone]     Social Drivers of Health (SDOH) Social History: SDOH Screenings   Food Insecurity: No Food Insecurity (10/22/2023)  Housing: Low Risk  (10/22/2023)  Recent Concern: Housing - High Risk (10/03/2023)  Transportation Needs: No Transportation Needs (10/22/2023)  Utilities: Not At Risk (10/22/2023)  Alcohol  Screen: Low Risk  (10/04/2023)  Depression (PHQ2-9): Low Risk  (10/07/2023)  Recent Concern: Depression (PHQ2-9) - High Risk (09/22/2023)  Financial Resource Strain: Low Risk  (10/04/2023)  Physical Activity: Insufficiently Active (01/07/2022)  Social Connections: Moderately Isolated (10/22/2023)  Stress: No Stress Concern Present (01/07/2022)  Tobacco Use: Medium Risk (10/23/2023)   SDOH Interventions:     Readmission Risk Interventions    09/29/2021   11:16 AM  Readmission Risk Prevention Plan  Transportation Screening Complete  PCP or Specialist Appt within 5-7 Days Complete  Home Care Screening Complete  Medication Review (RN CM) Complete

## 2023-10-26 ENCOUNTER — Telehealth: Payer: Self-pay | Admitting: Family Medicine

## 2023-10-26 ENCOUNTER — Other Ambulatory Visit (HOSPITAL_COMMUNITY): Payer: Self-pay

## 2023-10-26 DIAGNOSIS — I5023 Acute on chronic systolic (congestive) heart failure: Secondary | ICD-10-CM | POA: Diagnosis not present

## 2023-10-26 DIAGNOSIS — I10 Essential (primary) hypertension: Secondary | ICD-10-CM | POA: Diagnosis not present

## 2023-10-26 DIAGNOSIS — I2510 Atherosclerotic heart disease of native coronary artery without angina pectoris: Secondary | ICD-10-CM | POA: Diagnosis not present

## 2023-10-26 DIAGNOSIS — N1832 Chronic kidney disease, stage 3b: Secondary | ICD-10-CM | POA: Diagnosis not present

## 2023-10-26 LAB — RENAL FUNCTION PANEL
Albumin: 2.9 g/dL — ABNORMAL LOW (ref 3.5–5.0)
Anion gap: 8 (ref 5–15)
BUN: 38 mg/dL — ABNORMAL HIGH (ref 8–23)
CO2: 27 mmol/L (ref 22–32)
Calcium: 8.2 mg/dL — ABNORMAL LOW (ref 8.9–10.3)
Chloride: 98 mmol/L (ref 98–111)
Creatinine, Ser: 1.58 mg/dL — ABNORMAL HIGH (ref 0.61–1.24)
GFR, Estimated: 42 mL/min — ABNORMAL LOW (ref >60)
Glucose, Bld: 117 mg/dL — ABNORMAL HIGH (ref 70–99)
Phosphorus: 4.2 mg/dL (ref 2.5–4.6)
Potassium: 4 mmol/L (ref 3.5–5.1)
Sodium: 133 mmol/L — ABNORMAL LOW (ref 135–145)

## 2023-10-26 MED ORDER — TORSEMIDE 20 MG PO TABS
20.0000 mg | ORAL_TABLET | Freq: Every day | ORAL | 0 refills | Status: DC
  Filled 2023-10-26: qty 30, 30d supply, fill #0

## 2023-10-26 MED ORDER — HYDRALAZINE HCL 20 MG/ML IJ SOLN
10.0000 mg | Freq: Four times a day (QID) | INTRAMUSCULAR | Status: DC | PRN
  Administered 2023-10-26: 10 mg via INTRAVENOUS
  Filled 2023-10-26: qty 1

## 2023-10-26 MED ORDER — AMLODIPINE BESYLATE 10 MG PO TABS
10.0000 mg | ORAL_TABLET | Freq: Every day | ORAL | 0 refills | Status: DC
Start: 2023-10-26 — End: 2024-01-05
  Filled 2023-10-26: qty 30, 30d supply, fill #0

## 2023-10-26 MED ORDER — SPIRONOLACTONE 25 MG PO TABS
25.0000 mg | ORAL_TABLET | Freq: Every morning | ORAL | 0 refills | Status: DC
  Filled 2023-10-26: qty 30, 30d supply, fill #0

## 2023-10-26 MED ORDER — EMPAGLIFLOZIN 10 MG PO TABS
10.0000 mg | ORAL_TABLET | Freq: Every day | ORAL | 0 refills | Status: DC
Start: 2023-10-26 — End: 2024-01-05
  Filled 2023-10-26: qty 30, 30d supply, fill #0

## 2023-10-26 MED ORDER — FUROSEMIDE 40 MG PO TABS: 40.0000 mg | ORAL_TABLET | Freq: Every day | ORAL | Status: DC

## 2023-10-26 MED ORDER — TORSEMIDE 20 MG PO TABS: 20.0000 mg | ORAL_TABLET | Freq: Every day | ORAL | Status: DC

## 2023-10-26 NOTE — Plan of Care (Signed)
  Problem: Health Behavior/Discharge Planning: Goal: Ability to manage health-related needs will improve Outcome: Progressing   Problem: Clinical Measurements: Goal: Ability to maintain clinical measurements within normal limits will improve Outcome: Progressing Goal: Will remain free from infection Outcome: Progressing Goal: Diagnostic test results will improve Outcome: Progressing Goal: Respiratory complications will improve Outcome: Progressing Goal: Cardiovascular complication will be avoided Outcome: Progressing   Problem: Activity: Goal: Risk for activity intolerance will decrease Outcome: Progressing   Problem: Nutrition: Goal: Adequate nutrition will be maintained Outcome: Progressing   Problem: Coping: Goal: Level of anxiety will decrease Outcome: Progressing   Problem: Elimination: Goal: Will not experience complications related to bowel motility Outcome: Progressing Goal: Will not experience complications related to urinary retention Outcome: Progressing   Problem: Pain Managment: Goal: General experience of comfort will improve and/or be controlled Outcome: Progressing   Problem: Safety: Goal: Ability to remain free from injury will improve Outcome: Progressing   Problem: Skin Integrity: Goal: Risk for impaired skin integrity will decrease Outcome: Progressing   Problem: Education: Goal: Ability to demonstrate management of disease process will improve Outcome: Progressing Goal: Ability to verbalize understanding of medication therapies will improve Outcome: Progressing Goal: Individualized Educational Video(s) Outcome: Progressing   Problem: Activity: Goal: Capacity to carry out activities will improve Outcome: Progressing   Problem: Cardiac: Goal: Ability to achieve and maintain adequate cardiopulmonary perfusion will improve Outcome: Progressing

## 2023-10-26 NOTE — Progress Notes (Signed)
 AVS completed for discharge; Removed IV, Site clean, dry and intact.  Attempted to reviewed AVS with patient; patient seemed very confused.  Contacted patients Dtr [name] and asked if she would be picking patient up at discharge. She replied yes; asked when she would be able to pick patient up due to discharge in process. She replied she was on her way to the hospital. Asked how long would it be til she arrived at hospital so we could arrange for someone to review AVS/discharge information with her. She replied again she was on the way and did not give an approx. time of arrival.   AVS given to RN to review with patients Dtr.

## 2023-10-26 NOTE — Telephone Encounter (Signed)
 Nurses Please see the form that was sent to us  from [employer] it is flagged with a sticky note from myself  Patient recently discharged from the hospital with congestive heart failure He may be given an appointment next week with me either as hospital follow-up or same-day  Also talk with his daughter let her know that we received a request for us  to sign off on orders for a catheter supplies along with urinary bag please verify is this legitimate request?  It is from [employer] If it is not legitimate then you can just read the paper  Thank you-Dr. Glendia

## 2023-10-26 NOTE — Discharge Summary (Addendum)
 Physician Discharge Summary   Patient: [name] [name] MRN: [id number] DOB: 1934/09/21  Admit date:     10/22/2023  Discharge date: 10/26/23  Discharge Physician: Elidia Sieving Tenya Araque   PCP: Alphonsa Glendia LABOR, MD   Recommendations at discharge:    Patient has been placed on daily torsemide  for diuresis, added spironolactone  and resumed SGLT 2 inh. If renal function stable, to consider adding entresto  as outpatient.  Blood pressure control with amlodipine . Follow up renal function and electrolytes as outpatient Follow up with Dr Alphonsa in 7 to 10 days Follow up with Cardiology as scheduled. Patient with frequent re-hospitalizations for heart failure, possible low compliance. Attempted SNF but family has decided for assisted living facility.  Added home health services.   I spoke with patient's daughter over the phone, we talked in detail about patient's condition, plan of care and prognosis and all questions were addressed.   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 [facility] Course: Mr. Glasscock was admitted to the hospital with the working diagnosis of heart failure exacerbation.   89 yo male with the past medical history of heart failure, hypertension, dyslipidemia and CKD state 3b who presented with dyspnea.  Recent hospitalization for heart failure 07/05 to 10/05/23, due to non compliance.  For the last 3 to 4 days prior to hospitalization, he developed worsening dyspnea, orthopnea,  PND and lower extremity edema.On his initial physical examination his blood pressure was 141/93, HR 71, RR 27 and 02 saturation 91%. Lungs with no wheezing or rhonchi, heart with S1 and S2 present and regular with no gallops, rubs or murmurs, abdomen with no distention and positive lower extremity edema ++    Na 136, K 4.9 Cl 105 bicarbonate 22, glucose 98, bun 30 cr 1,37  BNP 1,604  High sensitive troponin 397 and 378  Wbc 8,4 hgb 10.9 plt 129   Chest radiograph with cardiomegaly, bilateral hilar vascular congestion, with small bilateral pleural effusions, fluid in the right fissure.   EKG 72 bpm, left axis deviation, left bundle branch block, qtc 491, sinus rhythm with no significant ST segment or T wave changes. Positive LVH, poor RR wave progression.   Patient placed on furosemide  for diuresis.   07/27 patient euvolemic state, pending possible transfer to SNF.  07/28 PT and OT re evaluation, possible transfer to SNF.  07/29 family decided to take patient home and wait for assisted living facility.   Assessment and Plan: * Acute on chronic systolic CHF (congestive heart failure) (HCC) Echocardiogram with reduced LV systolic function with EF 35 to 40%, moderate LVH, RV systolic function preserved, RVSP 51.8 mmHg, severe LA dilatation, small pericardial effusion. Mild tricuspid regurgitation.   Patient was placed on IV furosemide  for diuresis, negative fluid balance was achieved, -  8,054 ml, with significant improvement in his symptoms.   Plan to continue with spironolactone , SGLT 2 inh and carvedilol  Diuresis with torsemide  Patient will need outpatient support to prevent re  hospitalization.  Will arrange for home health  services, eventually patient will be admitted to assisted living facility.  History of non sustained VT, continue with carvedilol .   CKD stage 3b, GFR 30-44 ml/min (HCC) AKI   Volume status has improved, at the time of his discharge his serum cr is 1,58, K 4.0 and  serum bicarbonate at 27  Na 133 P 4.2   Continue diuresis with spironolactone , SGLT 2 inh and torsemide .  Follow up renal function and electrolytes as outpatient.   Essential hypertension Continue blood pressure control with amlodipine  and carvedilol   CAD S/P percutaneous coronary angioplasty No acute coronary  syndrome  High sensitive troponin elevation due to heart failure.    HLD (hyperlipidemia) Continue statin therapy   PAD (peripheral artery disease) (HCC) No claudication, continue with statin therapy   BPH (benign prostatic hyperplasia) No signs or urinary retention, continue with tamsulosin    Large cell lymphoma (HCC) Follow up as outpatient   GERD (gastroesophageal reflux disease) Continue with pantoprazole .       Consultants: cardiology  Procedures performed: none   Disposition: Home Diet recommendation:  Cardiac diet DISCHARGE MEDICATION: Allergies as of 10/26/2023       Reactions   Brilinta  [ticagrelor ] Shortness Of Breath, Other (See Comments)   VERTIGO, also   Influenza Vaccines Other (See Comments)   Pt states he has been hospitalized both times he was given flu vaccine as a younger adult while in the [place] and they told him not to take it again   Nsaids Other (See Comments)   Contraindication due to CKD stage 3    Fluoride Preparations Other (See Comments)   Reaction not recalled        Medication List     STOP taking these medications    furosemide  20 MG tablet Commonly known as: LASIX        TAKE these medications    acetaminophen  500 MG tablet Commonly known as: TYLENOL  Take 500 mg by mouth every 6 (six) hours as needed for mild pain (pain score 1-3) (or headaches).   amLODipine  10 MG tablet Commonly known as: NORVASC  Take 1 tablet (10 mg total) by mouth daily.   atorvastatin  20 MG tablet Commonly known as: LIPITOR TAKE 1 TABLET(20 MG) BY MOUTH DAILY   carvedilol  12.5 MG tablet Commonly known as: COREG  Take 1 tablet (12.5  mg total) by mouth 2 (two) times daily with a meal.   clopidogrel  75 MG tablet Commonly known as: PLAVIX  TAKE 1 TABLET BY MOUTH EVERY DAY   empagliflozin  10 MG Tabs tablet Commonly known as: Jardiance  Take 1 tablet (10 mg total) by mouth daily before breakfast.   feeding supplement Liqd Take 237 mLs by mouth 2 (two) times daily between meals.   methylcellulose 1 % ophthalmic solution Commonly known as: ARTIFICIAL TEARS Place 1 drop into both eyes 3 (three) times daily as needed (for dryness).   pantoprazole  20 MG tablet Commonly known as: PROTONIX  TAKE 1 TABLET BY MOUTH TWICE A DAY   Phenylephrine -Acetaminophen  5-325 MG Caps Take 1 capsule by mouth every 8 (eight) hours as needed (for allergic symptoms).   spironolactone  25 MG tablet Commonly known as: ALDACTONE  Take 1 tablet (25 mg total) by mouth every morning. Start taking on: October 27, 2023   tamsulosin  0.4 MG Caps capsule Commonly known as: FLOMAX  TAKE 1 CAPSULE EACH EVENING   torsemide  20 MG tablet Commonly known as: DEMADEX  Take 1 tablet (20 mg total) by mouth daily. Start taking on: October 27, 2023   traMADol  50 MG tablet  Commonly known as: ULTRAM  TAKE 1 TABLET BY MOUTH EVERY 8 HOURS AS NEEDED What changed: reasons to take this        Discharge Exam: Filed Weights   10/23/23 0504 10/24/23 0500 10/26/23 0450  Weight: 63.8 kg 63.4 kg 62.4 kg   BP (!) 183/99 (BP Location: Left Arm) Comment: administered PRN BP med see MAR/ MD is aware  Pulse 77   Temp (!) 97.5 F (36.4 C) (Oral)   Resp 20   Ht 5' 9 (1.753 m)   Wt 62.4 kg   SpO2 97%   BMI 20.32 kg/m   Patient with no chest pain and no dyspnea, no PND, orthopnea or lower extremity edema  Neurology awake and alert ENT with mild pallor Cardiovascular with S1 and S2 present and regular, with no gallops or rubs, no murmurs Respiratory with no rales or wheezing, no rhonchi  Abdomen with no distention  No lower extremity edema   Condition at  discharge: stable  The results of significant diagnostics from this hospitalization (including imaging, microbiology, ancillary and laboratory) are listed below for reference.   Imaging Studies: DG Chest Port 1 View Result Date: 10/22/2023 CLINICAL DATA:  sob EXAM: PORTABLE CHEST - 1 VIEW COMPARISON:  October 02, 2023, June 09, 2023 FINDINGS: Bilateral perihilar interstitial opacities. Small bilateral pleural effusions. No pneumothorax. Moderate cardiomegaly. Tortuous aorta with aortic atherosclerosis. No acute fracture or destructive lesions. Multilevel thoracic osteophytosis. Advanced degenerative changes of both shoulders. IMPRESSION: Moderate cardiomegaly with findings of interstitial edema. Small bilateral pleural effusions. Electronically Signed   By: Rogelia Myers M.D.   On: 10/22/2023 16:51   ECHOCARDIOGRAM COMPLETE Result Date: 10/03/2023    ECHOCARDIOGRAM REPORT   Patient Name:   LY BACCHI Date of Exam: 10/03/2023 Medical Rec #:  [id number]         Height:       67.7 in Accession #:    [id number]        Weight:       149.9 lb Date of Birth:  [birth year]        BSA:          1.803 m Patient Age:    88 years          BP:           152/73 mmHg Patient Gender: M                 HR:           72 bpm. Exam Location:  Inpatient Procedure: 2D Echo, Color Doppler and Cardiac Doppler (Both Spectral and Color            Flow Doppler were utilized during procedure). Indications:    I50.9* Heart failure (unspecified)  History:        Patient has prior history of Echocardiogram examinations, most                 recent 12/10/2022. CHF, CAD; Risk Factors:Hypertension and                 Dyslipidemia.  Sonographer:    Damien Senior RDCS Referring Phys: [id number] DORN DAWSON IMPRESSIONS  1. Left ventricular ejection fraction, by estimation, is 35 to 40%. Left ventricular ejection fraction by 2D MOD biplane is 37.9 %. The left ventricle has moderately decreased function. The left ventricle demonstrates regional  wall motion abnormalities (see scoring diagram/findings for description). There is moderate concentric left ventricular hypertrophy. Left ventricular diastolic parameters are  consistent with Grade I diastolic dysfunction (impaired relaxation).  2. Right ventricular systolic function is normal. The right ventricular size is normal. There is moderately elevated pulmonary artery systolic pressure. The estimated right ventricular systolic pressure is 51.8 mmHg.  3. Left atrial size was severely dilated.  4. A small pericardial effusion is present. The pericardial effusion is posterior to the left ventricle and anterior to the right ventricle. There is no evidence of cardiac tamponade.  5. The mitral valve is degenerative. Trivial mitral valve regurgitation.  6. The aortic valve is tricuspid. There is mild calcification of the aortic valve. Aortic valve regurgitation is mild. Aortic valve sclerosis/calcification is present, without any evidence of aortic stenosis.  7. The inferior vena cava is normal in size with <50% respiratory variability, suggesting right atrial pressure of 8 mmHg. Comparison(s): Prior images reviewed side by side. LVEF 35-40% range. Moderately elevated RVSP. Small pericardial effusion. FINDINGS  Left Ventricle: Left ventricular ejection fraction, by estimation, is 35 to 40%. Left ventricular ejection fraction by 2D MOD biplane is 37.9 %. The left ventricle has moderately decreased function. The left ventricle demonstrates regional wall motion abnormalities. The left ventricular internal cavity size was normal in size. There is moderate concentric left ventricular hypertrophy. Left ventricular diastolic parameters are consistent with Grade I diastolic dysfunction (impaired relaxation).  LV Wall Scoring: The basal inferior segment is akinetic. The mid and distal lateral wall, entire septum, mid and distal inferior wall, and apex are hypokinetic. The entire anterior wall, antero-lateral wall, and basal  inferolateral segment are normal. Right Ventricle: The right ventricular size is normal. No increase in right ventricular wall thickness. Right ventricular systolic function is normal. There is moderately elevated pulmonary artery systolic pressure. The tricuspid regurgitant velocity is 3.31 m/s, and with an assumed right atrial pressure of 8 mmHg, the estimated right ventricular systolic pressure is 51.8 mmHg. Left Atrium: Left atrial size was severely dilated. Right Atrium: Right atrial size was normal in size. Pericardium: A small pericardial effusion is present. The pericardial effusion is posterior to the left ventricle and anterior to the right ventricle. There is no evidence of cardiac tamponade. Mitral Valve: The mitral valve is degenerative in appearance. There is mild thickening of the mitral valve leaflet(s). Mild mitral annular calcification. Trivial mitral valve regurgitation. MV peak gradient, 9.4 mmHg. The mean mitral valve gradient is 4.0 mmHg. Tricuspid Valve: The tricuspid valve is grossly normal. Tricuspid valve regurgitation is mild. Aortic Valve: The aortic valve is tricuspid. There is mild calcification of the aortic valve. There is mild aortic valve annular calcification. Aortic valve regurgitation is mild. Aortic valve sclerosis/calcification is present, without any evidence of aortic stenosis. Pulmonic Valve: The pulmonic valve was grossly normal. Pulmonic valve regurgitation is trivial. Aorta: The aortic root and ascending aorta are structurally normal, with no evidence of dilitation. Venous: The inferior vena cava is normal in size with less than 50% respiratory variability, suggesting right atrial pressure of 8 mmHg. IAS/Shunts: No atrial level shunt detected by color flow Doppler. Additional Comments: 3D was performed not requiring image post processing on an independent workstation and was indeterminate.  LEFT VENTRICLE PLAX 2D                        Biplane EF (MOD) LVIDd:         4.00  cm         LV Biplane EF:   Left LVIDs:  3.10 cm                          ventricular LV PW:         1.50 cm                          ejection LV IVS:        1.50 cm                          fraction by LVOT diam:     2.10 cm                          2D MOD LV SV:         73                               biplane is LV SV Index:   41                               37.9 %. LVOT Area:     3.46 cm                                Diastology                                LV e' medial:    3.92 cm/s LV Volumes (MOD)               LV E/e' medial:  23.4 LV vol d, MOD    136.0 ml      LV e' lateral:   8.49 cm/s A2C:                           LV E/e' lateral: 10.8 LV vol d, MOD    125.0 ml A4C: LV vol s, MOD    86.2 ml A2C: LV vol s, MOD    79.1 ml A4C: LV SV MOD A2C:   49.8 ml LV SV MOD A4C:   125.0 ml LV SV MOD BP:    52.7 ml RIGHT VENTRICLE RV S prime:     14.10 cm/s TAPSE (M-mode): 1.7 cm LEFT ATRIUM              Index        RIGHT ATRIUM           Index LA diam:        4.50 cm  2.50 cm/m   RA Area:     17.40 cm LA Vol (A2C):   58.0 ml  32.17 ml/m  RA Volume:   43.40 ml  24.07 ml/m LA Vol (A4C):   123.0 ml 68.23 ml/m LA Biplane Vol: 88.3 ml  48.98 ml/m  AORTIC VALVE LVOT Vmax:   88.60 cm/s LVOT Vmean:  68.600 cm/s LVOT VTI:    0.212 m  AORTA Ao Root diam: 3.30 cm Ao Asc diam:  3.00 cm MITRAL VALVE                TRICUSPID VALVE MV Area (PHT): 3.61 cm  TR Peak grad:   43.8 mmHg MV Area VTI:   3.29 cm     TR Vmax:        331.00 cm/s MV Peak grad:  9.4 mmHg MV Mean grad:  4.0 mmHg     SHUNTS MV Vmax:       1.53 m/s     Systemic VTI:  0.21 m MV Vmean:      87.4 cm/s    Systemic Diam: 2.10 cm MV Decel Time: 210 msec MV E velocity: 91.70 cm/s MV A velocity: 143.00 cm/s MV E/A ratio:  0.64 Jayson Sierras MD Electronically signed by Jayson Sierras MD Signature Date/Time: 10/03/2023/12:29:16 PM    Final    CT Head Wo Contrast Result Date: 10/02/2023 CLINICAL DATA:  Fall, on Plavix  EXAM: CT HEAD WITHOUT CONTRAST  TECHNIQUE: Contiguous axial images were obtained from the base of the skull through the vertex without intravenous contrast. RADIATION DOSE REDUCTION: This exam was performed according to the departmental dose-optimization program which includes automated exposure control, adjustment of the mA and/or kV according to patient size and/or use of iterative reconstruction technique. COMPARISON:  03/01/2022 FINDINGS: Brain: There is atrophy and chronic small vessel disease changes. No acute intracranial abnormality. Specifically, no hemorrhage, hydrocephalus, mass lesion, acute infarction, or significant intracranial injury. Vascular: No hyperdense vessel or unexpected calcification. Skull: No acute calvarial abnormality. Sinuses/Orbits: No acute findings Other: Left frontal scalp nodule is again noted, measuring 2 cm, favored to represent sebaceous cyst. IMPRESSION: Atrophy, chronic microvascular disease. No acute intracranial abnormality. Electronically Signed   By: Franky Crease M.D.   On: 10/02/2023 23:13   DG Chest Port 1 View Result Date: 10/02/2023 CLINICAL DATA:  SOB EXAM: PORTABLE CHEST 1 VIEW COMPARISON:  CT chest 06/09/2023, chest x-ray 03/01/2022 FINDINGS: The heart and mediastinal contours are unchanged. Atherosclerotic plaque. No focal consolidation. Mild pulmonary edema. No pleural effusion. No pneumothorax. No acute osseous abnormality. Bilateral shoulder degenerative changes. IMPRESSION: 1. Mild pulmonary edema. 2.  Aortic Atherosclerosis (ICD10-I70.0). Electronically Signed   By: Morgane  Naveau M.D.   On: 10/02/2023 22:52    Microbiology: Results for orders placed or performed during the hospital encounter of 10/02/23  Resp panel by RT-PCR (RSV, Flu A&B, Covid) Anterior Nasal Swab     Status: None   Collection Time: 10/02/23 10:39 PM   Specimen: Anterior Nasal Swab  Result Value Ref Range Status   SARS Coronavirus 2 by RT PCR NEGATIVE NEGATIVE Final   Influenza A by PCR NEGATIVE NEGATIVE  Final   Influenza B by PCR NEGATIVE NEGATIVE Final    Comment: (NOTE) The Xpert Xpress SARS-CoV-2/FLU/RSV plus assay is intended as an aid in the diagnosis of influenza from Nasopharyngeal swab specimens and should not be used as a sole basis for treatment. Nasal washings and aspirates are unacceptable for Xpert Xpress SARS-CoV-2/FLU/RSV testing.  Fact Sheet for Patients: BloggerCourse.com  Fact Sheet for Healthcare Providers: SeriousBroker.it  This test is not yet approved or cleared by the [place]  FDA and has been authorized for detection and/or diagnosis of SARS-CoV-2 by FDA under an Emergency Use Authorization (EUA). This EUA will remain in effect (meaning this test can be used) for the duration of the COVID-19 declaration under Section 564(b)(1) of the Act, 21 U.S.C. section 360bbb-3(b)(1), unless the authorization is terminated or revoked.     Resp Syncytial Virus by PCR NEGATIVE NEGATIVE Final    Comment: (NOTE) Fact Sheet for Patients: BloggerCourse.com  Fact Sheet for Healthcare Providers: SeriousBroker.it  This test  is not yet approved or cleared by the [place]  FDA and has been authorized for detection and/or diagnosis of SARS-CoV-2 by FDA under an Emergency Use Authorization (EUA). This EUA will remain in effect (meaning this test can be used) for the duration of the COVID-19 declaration under Section 564(b)(1) of the Act, 21 U.S.C. section 360bbb-3(b)(1), unless the authorization is terminated or revoked.  Performed at [facility], [address], [place], [place] [address]     Labs: CBC: Recent Labs  Lab 10/22/23 1545 10/23/23 0324  WBC 8.4 6.4  NEUTROABS  --  4.5  HGB 10.9* 10.7*  HCT 34.1* 33.3*  MCV 91.7 90.7  PLT 129* 117*   Basic Metabolic Panel: Recent Labs  Lab 10/22/23 1545 10/22/23 1753 10/23/23 0324 10/24/23 0330  10/25/23 0300 10/26/23 0230  NA 136  --  139 142 136 133*  K 4.9  --  3.7 4.0 4.1 4.0  CL 105  --  101 103 99 98  CO2 22  --  27 29 26 27   GLUCOSE 98  --  85 107* 104* 117*  BUN 30*  --  30* 37* 38* 38*  CREATININE 1.37*  --  1.62* 1.66* 1.83* 1.58*  CALCIUM  7.9*  --  8.3* 8.5* 8.3* 8.2*  MG  --  2.1 2.0 2.2  --   --   PHOS  --   --  4.1  --  3.7 4.2   Liver Function Tests: Recent Labs  Lab 10/25/23 0300 10/26/23 0230  ALBUMIN 2.9* 2.9*   CBG: No results for input(s): GLUCAP in the last 168 hours.  Discharge time spent: greater than 30 minutes.  Signed: Elidia Toribio Furnace, MD [facility] 10/26/2023

## 2023-10-26 NOTE — Progress Notes (Signed)
 Occupational Therapy Treatment Patient Details Name: [name] [name] MRN: [id number] DOB: 01/29/1935 Today's Date: 10/26/2023   History of present illness Pt is an 89 y.o. male admitted 7/25 for SOB. Admitted for acute on chronic systolic and diastolic heart failure. PMH: admission 7/5 for hypoxia, admission 6/27-6/29 with elevated creatinine and acute renal failure, arthritis, CHF, HTN, HLD, CAD, PVD, STEMI x2, CVA x2   OT comments  Pt at this time was focused on getting to his breakfast but agreed to get OOB. At this time pt reported they have limited AROM in BUE shoulders at baseline but it has decrease since being in the hospital and limiting his participation in [place]. Pt completed sit to stand with CGA and step pivot to the chair with RW. Pt then required assist to open all containers for meal and placed close to midline. Patient will benefit from continued inpatient follow up therapy, <3 hours/day.      If plan is discharge home, recommend the following:  A little help with walking and/or transfers;A lot of help with bathing/dressing/bathroom;Assistance with cooking/housework;Assist for transportation;Help with stairs or ramp for entrance   Equipment Recommendations       Recommendations for Other Services      Precautions / Restrictions Precautions Precautions: Fall Recall of Precautions/Restrictions: Intact Precaution/Restrictions Comments: watch BP Restrictions Weight Bearing Restrictions Per Provider Order: No       Mobility Bed Mobility Overal bed mobility: Needs Assistance Bed Mobility: Supine to Sit     Supine to sit: Min assist          Transfers Overall transfer level: Needs assistance Equipment used: Rolling walker (2 wheels) Transfers: Sit to/from Stand, Bed to chair/wheelchair/BSC Sit to Stand: Min assist, From elevated surface     Step pivot transfers: Min assist           Balance Overall balance assessment: Needs assistance Sitting-balance  support: Feet supported, Bilateral upper extremity supported Sitting balance-Leahy Scale: Fair     Standing balance support: Bilateral upper extremity supported Standing balance-Leahy Scale: Poor Standing balance comment: reliant on walker                           ADL either performed or assessed with clinical judgement   ADL Overall ADL's : Needs assistance/impaired Eating/Feeding: Set up;Sitting;Minimal assistance Eating/Feeding Details (indicate cue type and reason): pt needed assist opening all containers on the tray prior to eating Grooming: Set up;Minimal assistance;Sitting   Upper Body Bathing: Contact guard assist;Minimal assistance;Sitting       Upper Body Dressing : Minimal assistance;Contact guard assist;Sitting       Toilet Transfer: Contact guard assist;Cueing for sequencing;Cueing for safety;Rolling walker (2 wheels) Toilet Transfer Details (indicate cue type and reason): step pivot to chair for simulated         Functional mobility during ADLs: Contact guard assist;Minimal assistance;Rolling walker (2 wheels)      Extremity/Trunk Assessment Upper Extremity Assessment Upper Extremity Assessment: Generalized weakness RUE Deficits / Details: Pt reporting they have chronic decrease AROM of BUE however but it has decreased. RUE Coordination: decreased fine motor;decreased gross motor LUE Deficits / Details: pt reports baseline bil arthritis R>L decreased bil shoulder flex < 60 degrees LUE Coordination: decreased fine motor;decreased gross motor   Lower Extremity Assessment Lower Extremity Assessment: Defer to PT evaluation        Vision   Vision Assessment?: No apparent visual deficits   Perception     Praxis  Communication Communication Communication: Impaired Factors Affecting Communication: Hearing impaired   Cognition Arousal: Alert Behavior During Therapy: WFL for tasks assessed/performed   Difficult to assess due to: Hard of  hearing/deaf           OT - Cognition Comments: Pt needs increase in time to process                 Following commands: Intact        Cueing   Cueing Techniques: Verbal cues, Visual cues  Exercises      Shoulder Instructions       General Comments      Pertinent Vitals/ Pain       Pain Assessment Pain Assessment: Faces Faces Pain Scale: Hurts little more Pain Location: R shoulder Pain Descriptors / Indicators: Discomfort, Sore Pain Intervention(s): Limited activity within patient's tolerance, Monitored during session, Repositioned  Home Living                                          Prior Functioning/Environment              Frequency  Min 2X/week        Progress Toward Goals  OT Goals(current goals can now be found in the care plan section)  Progress towards OT goals: Progressing toward goals  Acute Rehab OT Goals Patient Stated Goal: to have my shoulder get better OT Goal Formulation: With patient Time For Goal Achievement: 11/07/23 Potential to Achieve Goals: Good ADL Goals Pt Will Perform Grooming: with supervision;standing Pt Will Perform Lower Body Bathing: with supervision;sit to/from stand;sitting/lateral leans Pt Will Perform Lower Body Dressing: with supervision;sit to/from stand Pt Will Transfer to Toilet: with supervision;ambulating;regular height toilet Pt Will Perform Toileting - Clothing Manipulation and hygiene: with supervision;sit to/from stand;sitting/lateral leans  Plan      Co-evaluation                 AM-PAC OT 6 Clicks Daily Activity     Outcome Measure   Help from another person eating meals?: A Little Help from another person taking care of personal grooming?: A Little Help from another person toileting, which includes using toliet, bedpan, or urinal?: A Lot Help from another person bathing (including washing, rinsing, drying)?: A Lot Help from another person to put on and taking  off regular upper body clothing?: A Little Help from another person to put on and taking off regular lower body clothing?: A Lot 6 Click Score: 15    End of Session Equipment Utilized During Treatment: Gait belt;Rolling walker (2 wheels)  OT Visit Diagnosis: Unsteadiness on feet (R26.81);Repeated falls (R29.6);Other abnormalities of gait and mobility (R26.89);Muscle weakness (generalized) (M62.81)   Activity Tolerance Patient tolerated treatment well   Patient Left in chair;with call bell/phone within reach;with chair alarm set   Nurse Communication Mobility status        Time: 9281-9256 OT Time Calculation (min): 25 min  Charges: OT General Charges $OT Visit: 1 Visit OT Treatments $Self Care/Home Management : 23-37 mins  Warrick POUR OTR/L  Acute Rehab Services  [phone] office number   Warrick Berber 10/26/2023, 7:53 AM

## 2023-10-26 NOTE — TOC Progression Note (Signed)
 Transition of Care [facility]) - Progression Note    Patient Details  Name: [name] [name] MRN: [id number] Date of Birth: 1934/05/03  Transition of Care [facility]) CM/SW Contact  Waddell Barnie Rama, RN Phone Number: 10/26/2023, 2:58 PM  Clinical Narrative:    NCM notified by CSW that daughter, [name] wants to take him home.  NCM spoke with patient at the bedside, he states to call his daughter, [name].   NCM contacted [name], offered choice, she chose [place].  NCM made referral to [facility] with [place] , he is already active for [facility], HHPT, HHOT,HHAIDE and CSW.  Soc will begin 24 to 48 hrs post dc.  Daughter states she will transport him home at dc.   Expected Discharge Plan: Skilled Nursing Facility Barriers to Discharge: Continued Medical Work up, SNF Pending bed offer, Insurance Authorization               Expected Discharge Plan and Services In-house Referral: Clinical Social Work Discharge Planning Services: CM Consult   Living arrangements for the past 2 months: Skilled Nursing Facility Expected Discharge Date: 10/26/23                                     Social Drivers of Health (SDOH) Interventions SDOH Screenings   Food Insecurity: No Food Insecurity (10/22/2023)  Housing: Low Risk  (10/22/2023)  Recent Concern: Housing - High Risk (10/03/2023)  Transportation Needs: No Transportation Needs (10/22/2023)  Utilities: Not At Risk (10/22/2023)  Alcohol  Screen: Low Risk  (10/04/2023)  Depression (PHQ2-9): Low Risk  (10/07/2023)  Recent Concern: Depression (PHQ2-9) - High Risk (09/22/2023)  Financial Resource Strain: Low Risk  (10/04/2023)  Physical Activity: Insufficiently Active (01/07/2022)  Social Connections: Moderately Isolated (10/22/2023)  Stress: No Stress Concern Present (01/07/2022)  Tobacco Use: Medium Risk (10/23/2023)    Readmission Risk Interventions    09/29/2021   11:16 AM  Readmission Risk Prevention Plan  Transportation Screening Complete  PCP or  Specialist Appt within 5-7 Days Complete  Home Care Screening Complete  Medication Review (RN CM) Complete
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
## 2023-10-26 NOTE — Plan of Care (Signed)
  Problem: Clinical Measurements: Goal: Will remain free from infection Outcome: Progressing Goal: Respiratory complications will improve Outcome: Progressing   Problem: Elimination: Goal: Will not experience complications related to urinary retention Outcome: Progressing   Problem: Health Behavior/Discharge Planning: Goal: Ability to manage health-related needs will improve Outcome: Not Progressing   Problem: Pain Managment: Goal: General experience of comfort will improve and/or be controlled Outcome: Not Progressing

## 2023-10-26 NOTE — Progress Notes (Signed)
 Physical Therapy Treatment Patient Details Name: [name] [name] MRN: [id number] DOB: [birth year] Today's Date: 10/26/2023   History of Present Illness Pt is an 89 y.o. male presenting 7/25 with SOB. Admitted for acute on chronic systolic and diastolic heart failure. PMH: admission 7/5 for hypoxia, admission 6/27-6/29 with elevated creatinine and acute renal failure, arthritis, CHF, HTN, HLD, CAD, PVD, STEMI x2, CVA x2    PT Comments  The pt was agreeable to session despite fatigue and reports of L shoulder pain. Session focused on LE strengthening through exercises against manual resistance and sit-stand transfers from various surfaces. Pt fatigued after 4-6 reps against min resistance in BLE, and required modA to manage controlled lower for sit-stands from recliner height. Will continue efforts acutely, recommendations remain appropriate.    If plan is discharge home, recommend the following: Assistance with cooking/housework;Assistance with feeding;Direct supervision/assist for medications management;Direct supervision/assist for financial management;Assist for transportation;Help with stairs or ramp for entrance;A little help with walking and/or transfers;A little help with bathing/dressing/bathroom   Can travel by private vehicle     Yes  Equipment Recommendations  BSC/3in1;Wheelchair (measurements PT);Wheelchair cushion (measurements PT)    Recommendations for Other Services       Precautions / Restrictions Precautions Precautions: Fall Recall of Precautions/Restrictions: Intact Precaution/Restrictions Comments: watch BP Restrictions Weight Bearing Restrictions Per Provider Order: No     Mobility  Bed Mobility Overal bed mobility: Needs Assistance             General bed mobility comments: pt OOB at start and end of session    Transfers Overall transfer level: Needs assistance Equipment used: Rolling walker (2 wheels) Transfers: Sit to/from Stand, Bed to  chair/wheelchair/BSC Sit to Stand: Min assist, Mod assist   Step pivot transfers: Min assist       General transfer comment: Min assist to power up and balance. Mod assist to control descent to chair. completed x5 from elevated bed with minA    Ambulation/Gait Ambulation/Gait assistance: Mod assist Gait Distance (Feet): 5 Feet (x2) Assistive device: Rolling walker (2 wheels) Gait Pattern/deviations: Decreased stride length, Decreased weight shift to left, Decreased dorsiflexion - right, Decreased step length - right, Step-to pattern, Decreased stance time - left, Knee flexed in stance - right, Knee flexed in stance - left (rt lateral lean) Gait velocity: decreased Gait velocity interpretation: <1.31 ft/sec, indicative of household ambulator   General Gait Details: Assist with balance and support. Pt with rt lateral lean. limited this session by fatigue but no overt buckling   Stairs             Wheelchair Mobility     Tilt Bed    Modified Rankin (Stroke Patients Only)       Balance Overall balance assessment: Needs assistance Sitting-balance support: Feet supported, Bilateral upper extremity supported Sitting balance-Leahy Scale: Fair Sitting balance - Comments: fatigues quickly leaning posteriorly on chair Postural control: Posterior lean Standing balance support: Bilateral upper extremity supported, During functional activity, Reliant on assistive device for balance Standing balance-Leahy Scale: Poor Standing balance comment: walker and min to mod assist for static standing                            Communication Communication Communication: Impaired Factors Affecting Communication: Hearing impaired  Cognition Arousal: Alert Behavior During Therapy: WFL for tasks assessed/performed   PT - Cognitive impairments: No apparent impairments  PT - Cognition Comments: pt with increased time processing instructions,  faltter affect today. not formally assessed Following commands: Intact      Cueing Cueing Techniques: Verbal cues, Visual cues  Exercises General Exercises - Lower Extremity Long Arc Quad: Both, 5 reps, Strengthening (against min resistance, pt fatigued after 4-6 each LE) Heel Slides: Strengthening, Both, 5 reps, Seated (against min resistance, pt fatigued after 4-6 reps each leg) Hip ABduction/ADduction: Strengthening, Both, 5 reps, Seated (3 sec hold) Hip Flexion/Marching: AROM, Both, 10 reps, Seated Heel Raises: AROM, Both, 10 reps, Seated Other Exercises Other Exercises: 5x sit-stand from elevated EOB, repeated cues for hand placement    General Comments General comments (skin integrity, edema, etc.): VSS on RA      Pertinent Vitals/Pain Pain Assessment Pain Assessment: Faces Pain Score: 6  Faces Pain Scale: Hurts little more Pain Location: L shoulder Pain Descriptors / Indicators: Discomfort, Sore Pain Intervention(s): Limited activity within patient's tolerance, Monitored during session, Repositioned, RN gave pain meds during session     PT Goals (current goals can now be found in the care plan section) Acute Rehab PT Goals Patient Stated Goal: get stronger PT Goal Formulation: With patient Time For Goal Achievement: 11/07/23 Potential to Achieve Goals: Fair Progress towards PT goals: Progressing toward goals    Frequency    Min 2X/week       AM-PAC PT 6 Clicks Mobility   Outcome Measure  Help needed turning from your back to your side while in a flat bed without using bedrails?: A Little Help needed moving from lying on your back to sitting on the side of a flat bed without using bedrails?: A Little Help needed moving to and from a bed to a chair (including a wheelchair)?: A Little Help needed standing up from a chair using your arms (e.g., wheelchair or bedside chair)?: A Lot Help needed to walk in hospital room?: Total (<20 ft) Help needed climbing 3-5  steps with a railing? : Total 6 Click Score: 13    End of Session Equipment Utilized During Treatment: Gait belt Activity Tolerance: Patient tolerated treatment well Patient left: with call bell/phone within reach;in chair;with chair alarm set Nurse Communication: Mobility status PT Visit Diagnosis: Unsteadiness on feet (R26.81);Muscle weakness (generalized) (M62.81)     Time: 9097-9071 PT Time Calculation (min) (ACUTE ONLY): 26 min  Charges:    $Therapeutic Exercise: 23-37 mins PT General Charges $$ ACUTE PT VISIT: 1 Visit                     Izetta Call, PT, DPT   Acute Rehabilitation Department Office [phone] Secure Chat Communication Preferred    Izetta JULIANNA Call 10/26/2023, 11:18 AM

## 2023-10-27 ENCOUNTER — Telehealth: Admitting: *Deleted

## 2023-10-27 ENCOUNTER — Telehealth: Payer: Self-pay

## 2023-10-27 NOTE — Transitions of Care (Post Inpatient/ED Visit) (Signed)
   10/27/2023  Name: [name] [name] MRN: [id number] DOB: 1935/02/21  Today's TOC FU Call Status: Today's TOC FU Call Status:: Successful TOC FU Call Completed [facility] RN spoke with daugther who advised they have made a decision to enroll patient in hospice care and hospice is calling back today to schedule appointment - Daughter denied need for [facility] calls since patient will be in hospice care) Patient's Name and Date of Birth confirmed.  Transition Care Management Follow-up Telephone Call Date of Discharge: 10/26/23 Discharge Facility: [facility]) Type of Discharge: Inpatient Admission Primary Inpatient Discharge Diagnosis:: Acute on chronic systolic CHF  Shona Prow RN, CCM [place]  VBCI-Population Health RN Care Manager [phone]

## 2023-11-01 ENCOUNTER — Ambulatory Visit: Attending: Cardiovascular Disease | Admitting: Cardiovascular Disease

## 2023-11-01 ENCOUNTER — Encounter: Payer: Self-pay | Admitting: Family Medicine

## 2023-11-01 NOTE — Telephone Encounter (Signed)
 Daughter states patient has went under hospice care and is too weak to come to office- provider notified

## 2023-11-02 NOTE — Telephone Encounter (Signed)
 I spoke with the daughter-she relates that the hospice palliative care service that they are utilizing is out of [place]  and they have their own doctor who will manage the patient from this point forward  I will do a courtesy visit with the patient somewhere in the next couple weeks-family requested not this week because of ongoing appointments with their mother

## 2023-11-03 ENCOUNTER — Inpatient Hospital Stay: Admitting: Family Medicine

## 2023-11-15 DIAGNOSIS — I4729 Other ventricular tachycardia: Secondary | ICD-10-CM | POA: Diagnosis not present

## 2023-11-15 DIAGNOSIS — I130 Hypertensive heart and chronic kidney disease with heart failure and stage 1 through stage 4 chronic kidney disease, or unspecified chronic kidney disease: Secondary | ICD-10-CM | POA: Diagnosis not present

## 2023-11-15 DIAGNOSIS — I6523 Occlusion and stenosis of bilateral carotid arteries: Secondary | ICD-10-CM | POA: Diagnosis not present

## 2023-11-15 DIAGNOSIS — I5042 Chronic combined systolic (congestive) and diastolic (congestive) heart failure: Secondary | ICD-10-CM | POA: Diagnosis not present

## 2023-11-15 NOTE — Discharge Summary (Signed)
 Physician Discharge Summary  [name] [name] FMW:[id number] DOB: [birth year] DOA: 10/02/2023  PCP: Alphonsa Glendia LABOR, MD  Admit date: 10/02/2023 Discharge date: 10/05/2023  Time spent: 35 minutes  Recommendations for Outpatient Follow-up:  CHF TOC clinic on 7/14, please check BMP at follow-up   Discharge Diagnoses:  Acute on chronic systolic CHF Acute hypoxic respiratory failure Fall Hypertensive urgency Hyperkalemia CAD CVA History of large B-cell lymphoma CKD 3 AA BPH   Hypertensive urgency   Acute hypoxic respiratory failure (HCC)   Ground-level fall   Discharge Condition: Improved  Diet recommendation: Low sodium, heart healthy  Filed Weights   10/03/23 0323 10/04/23 0500 10/05/23 0400  Weight: 68 kg 67.1 kg 63.5 kg    History of present illness:  89 y.o. 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  Back in the ED 7/5 with acute progressive shortness of breath x 2-3 days.  Appears that he was discharged with Lasix  prn for weight gain but had not been using despite weight going up.  Weight has gone from 136 LB -> 144 LB over the past week.   Hospital Course:   Acute on chronic heart failure with reduced ejection fraction Acute hypoxic respiratory failure  Last TTE 9/' 24 LVEF 30 to 35%, global hypokinesis, severe concentric LVH, grade 2 diastolic dysfunction,  -Repeat echo with EF 35-40% with normal RV, WMA, largely unchanged from prior - Recent admission with AKI, creatinine of 3.9, improved with gentle hydration, stopping Entresto  and holding diuretics, discharged last week  on Lasix  PRN which he did not use - Improving with diuresis, transition to oral Lasix  and Coreg  continued -GDMT limited by resolving AKI/CKD - borderline candidate for SGLT2i with BPH,  -Unfortunately eats from [employer] or [employer] every day, sausages, hot dogs etc. counseled - Follow-up with CHF TOC clinic on 7/14, please check BMP at follow-up and resume ARB as tolerated   Ground level fall  Generalized weakness, deconditioning  CT head without acute abnormality.  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  Not felt to be candidate for repair due to complexity of endovascular repair and risks of open surgery.   History large B-cell lymphoma s/p chemo/rituximab  in remission: - Noted outpatient surveillance.   CKD3a: Baseline creatinine approximately 1.4      BPH: Continue home tamsulosin     Anemia / thrombocytopenia: Chronic, stable    Chronic pain: tramadol  prn continued    Discharge Exam: Vitals:   10/05/23 0400 10/05/23 0752  BP: (!) 152/97   Pulse: 79   Resp: 20   Temp: 97.9 F (36.6 C) (P) 98.5 F (36.9 C)  SpO2: 96%    General exam: Appears calm and comfortable  HEENT: Positive JVD Respiratory system: Few basilar rales Cardiovascular system: S1 & S2 heard, RRR.  Abd: nondistended, soft and nontender.Normal bowel sounds heard. Central nervous system: Alert and oriented. No focal neurological deficits. Extremities: Trace edema Skin: No rashes Psychiatry:  Mood & affect appropriate.   Discharge Instructions   Discharge Instructions      Diet - low sodium heart healthy   Complete by: As directed    Increase activity slowly   Complete by: As directed       Allergies as of 10/05/2023       Reactions   Brilinta  [ticagrelor ] Shortness Of Breath, Other (See Comments)   VERTIGO, also   Influenza Vaccines Other (See Comments)   Pt states he has been hospitalized both times he was given flu vaccine as a younger adult while in the [place] and they told him not to take it again   Fluoride Preparations Other (See Comments)   Reaction not recalled        Medication List     STOP taking these medications    Entresto  49-51 MG Generic drug: sacubitril -valsartan    furosemide  20 MG tablet Commonly known as: LASIX        TAKE these medications    acetaminophen  500 MG tablet Commonly known as: TYLENOL  Take 500 mg by mouth every 6 (six) hours as needed for mild pain (pain score 1-3) (or headaches).   atorvastatin  20 MG tablet Commonly known as: LIPITOR TAKE 1 TABLET(20 MG) BY MOUTH DAILY   carvedilol  12.5 MG tablet Commonly known as: COREG  Take 1 tablet (12.5 mg total) by mouth 2 (two) times daily with a meal.   clopidogrel  75 MG tablet Commonly known as: PLAVIX  TAKE 1 TABLET BY MOUTH EVERY DAY   feeding supplement Liqd Take 237 mLs by mouth 2 (two) times daily between meals.   methylcellulose 1 % ophthalmic solution Commonly known as: ARTIFICIAL TEARS Place 1 drop into both eyes 3 (three) times daily as needed (for dryness).   pantoprazole  20 MG tablet Commonly known as: PROTONIX  TAKE 1 TABLET BY MOUTH TWICE A DAY   Phenylephrine -Acetaminophen  5-325 MG Caps Take 1 capsule by mouth every 8 (eight) hours as needed (for allergic symptoms).   tamsulosin  0.4 MG Caps capsule Commonly known as: FLOMAX  TAKE 1 CAPSULE EACH EVENING   traMADol  50 MG tablet Commonly known as: ULTRAM  TAKE 1 TABLET BY MOUTH EVERY 8 HOURS AS NEEDED What changed: reasons to take this       Allergies  Allergen Reactions    Brilinta  [Ticagrelor ] Shortness Of Breath and Other (See Comments)    VERTIGO, also   Influenza Vaccines Other (See Comments)    Pt states he has been hospitalized both times he was given flu vaccine as a younger adult while in the [place] and they told him not to take it again   Nsaids Other (See Comments)    Contraindication due to CKD stage 3    Fluoride Preparations Other (See Comments)    Reaction not recalled    Follow-up Information     [facility]. Go in 6 day(s).   Specialty: Cardiology Why: Hospital follow up 10/11/2023 @ 11:45 am PLEASE bring a current medication list to appointment FREE valet parking, Entrance C, off [address], ( Look for [employer] entrance) Contact information: [address] [place]  [phone]        Alphonsa Glendia LABOR, MD Follow up.   Specialty: Family Medicine Why: The office will call patient's daughter Contact information: [address]  [place] [address] [phone]         [facility] Follow up.   Specialty: Home Health Services Why: Agency will call you to set up apt times Contact information: [address] [place] [address] [phone]                  The results of significant diagnostics from this hospitalization (including imaging, microbiology, ancillary and laboratory) are listed below for reference.    Significant Diagnostic Studies: DG Chest Port 1 View Result Date: 10/22/2023 CLINICAL DATA:  sob EXAM: PORTABLE CHEST - 1 VIEW COMPARISON:  October 02, 2023, June 09, 2023 FINDINGS: Bilateral perihilar interstitial opacities. Small bilateral pleural effusions. No pneumothorax. Moderate cardiomegaly. Tortuous aorta with aortic atherosclerosis. No acute fracture or destructive lesions. Multilevel thoracic osteophytosis. Advanced degenerative changes of both shoulders. IMPRESSION:  Moderate cardiomegaly with findings of interstitial edema. Small bilateral pleural effusions. Electronically Signed   By: Rogelia Myers M.D.   On: 10/22/2023 16:51    Microbiology: No results found for this or any previous visit (from the past 240 hours).   Labs: Basic Metabolic Panel: No results for input(s): NA, K, CL, CO2, GLUCOSE, BUN, CREATININE, CALCIUM , MG, PHOS in the last 168 hours. Liver Function Tests: No results for input(s): AST, ALT, ALKPHOS, BILITOT, PROT, ALBUMIN in the last 168 hours. No results for input(s): LIPASE, AMYLASE in the last 168 hours. No results for input(s): AMMONIA in the last 168 hours. CBC: No results for input(s): WBC, NEUTROABS, HGB, HCT, MCV, PLT in the last 168 hours. Cardiac Enzymes: No results for input(s): CKTOTAL, CKMB, CKMBINDEX, TROPONINI in the last 168 hours. BNP: BNP (last 3 results) Recent Labs    10/02/23 2250 10/22/23 1544 10/23/23 0324  BNP 1,989.0* 1,604.5* 1,300.3*    ProBNP (last 3 results) No results for input(s): PROBNP in the last 8760 hours.  CBG: No results for input(s): GLUCAP in the last 168 hours.     Signed:  Sigurd Pac MD.  [facility] 11/15/2023, 2:19 PM

## 2023-12-21 ENCOUNTER — Other Ambulatory Visit: Payer: Self-pay | Admitting: Family Medicine

## 2023-12-26 DIAGNOSIS — Z7984 Long term (current) use of oral hypoglycemic drugs: Secondary | ICD-10-CM | POA: Diagnosis not present

## 2023-12-26 DIAGNOSIS — Z860100 Personal history of colon polyps, unspecified: Secondary | ICD-10-CM | POA: Diagnosis not present

## 2023-12-26 DIAGNOSIS — I252 Old myocardial infarction: Secondary | ICD-10-CM | POA: Diagnosis not present

## 2023-12-26 DIAGNOSIS — E785 Hyperlipidemia, unspecified: Secondary | ICD-10-CM | POA: Diagnosis not present

## 2023-12-26 DIAGNOSIS — C8580 Other specified types of non-Hodgkin lymphoma, unspecified site: Secondary | ICD-10-CM | POA: Diagnosis not present

## 2023-12-26 DIAGNOSIS — I2510 Atherosclerotic heart disease of native coronary artery without angina pectoris: Secondary | ICD-10-CM | POA: Diagnosis not present

## 2023-12-26 DIAGNOSIS — I5023 Acute on chronic systolic (congestive) heart failure: Secondary | ICD-10-CM | POA: Diagnosis not present

## 2023-12-26 DIAGNOSIS — N1832 Chronic kidney disease, stage 3b: Secondary | ICD-10-CM | POA: Diagnosis not present

## 2023-12-26 DIAGNOSIS — I4729 Other ventricular tachycardia: Secondary | ICD-10-CM | POA: Diagnosis not present

## 2023-12-26 DIAGNOSIS — I088 Other rheumatic multiple valve diseases: Secondary | ICD-10-CM | POA: Diagnosis not present

## 2023-12-26 DIAGNOSIS — I700 Atherosclerosis of aorta: Secondary | ICD-10-CM | POA: Diagnosis not present

## 2023-12-26 DIAGNOSIS — N400 Enlarged prostate without lower urinary tract symptoms: Secondary | ICD-10-CM | POA: Diagnosis not present

## 2023-12-26 DIAGNOSIS — F322 Major depressive disorder, single episode, severe without psychotic features: Secondary | ICD-10-CM | POA: Diagnosis not present

## 2023-12-26 DIAGNOSIS — I447 Left bundle-branch block, unspecified: Secondary | ICD-10-CM | POA: Diagnosis not present

## 2023-12-26 DIAGNOSIS — D696 Thrombocytopenia, unspecified: Secondary | ICD-10-CM | POA: Diagnosis not present

## 2023-12-26 DIAGNOSIS — I739 Peripheral vascular disease, unspecified: Secondary | ICD-10-CM | POA: Diagnosis not present

## 2023-12-26 DIAGNOSIS — I69351 Hemiplegia and hemiparesis following cerebral infarction affecting right dominant side: Secondary | ICD-10-CM | POA: Diagnosis not present

## 2023-12-26 DIAGNOSIS — D689 Coagulation defect, unspecified: Secondary | ICD-10-CM | POA: Diagnosis not present

## 2023-12-26 DIAGNOSIS — K219 Gastro-esophageal reflux disease without esophagitis: Secondary | ICD-10-CM | POA: Diagnosis not present

## 2023-12-26 DIAGNOSIS — Z7902 Long term (current) use of antithrombotics/antiplatelets: Secondary | ICD-10-CM | POA: Diagnosis not present

## 2023-12-26 DIAGNOSIS — L89312 Pressure ulcer of right buttock, stage 2: Secondary | ICD-10-CM | POA: Diagnosis not present

## 2023-12-26 DIAGNOSIS — I5032 Chronic diastolic (congestive) heart failure: Secondary | ICD-10-CM | POA: Diagnosis not present

## 2023-12-26 DIAGNOSIS — I3139 Other pericardial effusion (noninflammatory): Secondary | ICD-10-CM | POA: Diagnosis not present

## 2023-12-26 DIAGNOSIS — I130 Hypertensive heart and chronic kidney disease with heart failure and stage 1 through stage 4 chronic kidney disease, or unspecified chronic kidney disease: Secondary | ICD-10-CM | POA: Diagnosis not present

## 2023-12-27 ENCOUNTER — Telehealth: Payer: Self-pay | Admitting: *Deleted

## 2023-12-27 ENCOUNTER — Telehealth: Payer: Self-pay

## 2023-12-27 NOTE — Telephone Encounter (Signed)
 Similar situation to his wife Appreciate TOC note Very difficult situation for the patient Previously he was hospice but then he decided to do some rehab I assume he is still at [facility]?  I also assume that they have a nurse practitioner or medical service there that handles day-to-day needs

## 2023-12-27 NOTE — Telephone Encounter (Signed)
 He was under hospice but then family decided to pursue physical therapy and rehab and took him out of hospice  May have verbal order  My question is he at home now or is he at [facility]?

## 2023-12-27 NOTE — Transitions of Care (Post Inpatient/ED Visit) (Unsigned)
 12/27/2023  Name: [name] [name] MRN: [id number] DOB: 1935/01/20  Today's TOC FU Call Status: Today's TOC FU Call Status:: Successful TOC FU Call Completed TOC FU Call Complete Date: 12/27/23 Patient's Name and Date of Birth confirmed.  Transition Care Management Follow-up Telephone Call Date of Discharge: 12/24/23 Discharge Facility: Other [facility]) Name of Other ([facility]) Discharge Facility: [facility] Type of Discharge: Inpatient Admission Primary Inpatient Discharge Diagnosis:: CHF How have you been since you were released from the hospital?: Same Any questions or concerns?: Yes Patient Questions/Concerns:: wife states patient will not try and walk, he is sleeping a lot with no motivation to do anything. Patient Questions/Concerns Addressed: Notified Provider of Patient Questions/Concerns  Items Reviewed: Did you receive and understand the discharge instructions provided?: Yes Medications obtained,verified, and reconciled?: Yes (Medications Reviewed) Any new allergies since your discharge?: No Dietary orders reviewed?: NA Do you have support at home?: Yes People in Home [RPT]: child(ren), adult Name of Support/Comfort Primary Source: [name] and sitter  Medications Reviewed Today: Medications Reviewed Today     Reviewed by Lang Avelina PARAS, CMA (Certified Medical Assistant) on 12/27/23 at 1117  Med List Status: <None>   Medication Order Taking? Sig Documenting Provider Last Dose Status Informant  acetaminophen  (TYLENOL ) 500 MG tablet [id number]  Take 500 mg by mouth every 6 (six) hours as needed for mild pain (pain score 1-3) (or headaches). [provider]  Active Pharmacy Records, Self, Multiple Informants  amLODipine  (NORVASC ) 10 MG tablet [id number]  Take 1 tablet (10 mg total) by mouth daily. Arrien, Elidia Sieving, MD  Active   atorvastatin  (LIPITOR) 20 MG tablet [id number]  TAKE 1 TABLET(20 MG) BY MOUTH DAILY Duke, Jon Garre, PA  Active  Pharmacy Records, Self, Multiple Informants  carvedilol  (COREG ) 12.5 MG tablet [id number]  Take 1 tablet (12.5 mg total) by mouth 2 (two) times daily with a meal. Arrien, Elidia Sieving, MD  Expired 10/26/23 2359 Pharmacy Records, Self, Multiple Informants  clopidogrel  (PLAVIX ) 75 MG tablet [id number]  TAKE 1 TABLET BY MOUTH EVERY DAY Court Dorn PARAS, MD  Active Pharmacy Records, Self, Multiple Informants  empagliflozin  (JARDIANCE ) 10 MG TABS tablet [id number]  Take 1 tablet (10 mg total) by mouth daily before breakfast. Arrien, Elidia Sieving, MD  Active   feeding supplement (ENSURE PLUS HIGH PROTEIN) LIQD [id number]  Take 237 mLs by mouth 2 (two) times daily between meals. Arrien, Elidia Sieving, MD  Active Pharmacy Records, Self, Multiple Informants  haloperidol (HALDOL) 1 MG tablet [id number] Yes Take 1 mg by mouth every 4 (four) hours as needed. [provider]  Active   methylcellulose (ARTIFICIAL TEARS) 1 % ophthalmic solution [id number]  Place 1 drop into both eyes 3 (three) times daily as needed (for dryness). [provider]  Active Pharmacy Records, Self, Multiple Informants  pantoprazole  (PROTONIX ) 20 MG tablet [id number]  TAKE 1 TABLET BY MOUTH TWICE A DAY Cook, Jayce G, DO  Active   Phenylephrine -Acetaminophen  5-325 MG CAPS [id number]  Take 1 capsule by mouth every 8 (eight) hours as needed (for allergic symptoms). [provider]  Active Pharmacy Records, Self, Multiple Informants  spironolactone  (ALDACTONE ) 25 MG tablet [id number]  Take 1 tablet (25 mg total) by mouth every morning. Arrien, Elidia Sieving, MD  Active   tamsulosin  (FLOMAX ) 0.4 MG CAPS capsule [id number]  TAKE 1 CAPSULE EACH EVENING Luking, Glendia LABOR, MD  Active Pharmacy Records, Self, Multiple Informants  torsemide  (DEMADEX ) 20 MG tablet [id number]  Take 1 tablet (20 mg total) by  mouth daily. Arrien, Elidia Sieving, MD  Expired 11/26/23 2359   traMADol  (ULTRAM ) 50 MG tablet [id number]  TAKE 1 TABLET BY  MOUTH EVERY 8 HOURS AS NEEDED  Patient taking differently: Take 50 mg by mouth every 8 (eight) hours as needed for moderate pain (pain score 4-6) or severe pain (pain score 7-10).   Alphonsa Glendia LABOR, MD  Active Pharmacy Records, Self, Multiple Informants            Home Care and Equipment/Supplies: Were Home Health Services Ordered?: Yes Name of Home Health Agency:: [facility] Has Agency set up a time to come to your home?: No EMR reviewed for Home Health Orders: Orders present/patient has not received call (refer to CM for follow-up) (waiting on a call from home health to set up PT) Any new equipment or medical supplies ordered?: No (pt is using a wheelchair)  Functional Questionnaire: Do you need assistance with bathing/showering or dressing?: Yes Do you need assistance with meal preparation?: No Do you need assistance with eating?: No Do you have difficulty maintaining continence: No Do you need assistance with getting out of bed/getting out of a chair/moving?: Yes Do you have difficulty managing or taking your medications?: No  Follow up appointments reviewed: PCP Follow-up appointment confirmed?: Yes Date of PCP follow-up appointment?: 01/05/24 Follow-up Provider: Dr. Alphonsa Specialist [facility] Follow-up appointment confirmed?: NA Do you need transportation to your follow-up appointment?: No Do you understand care options if your condition(s) worsen?: Yes-patient verbalized understanding    SIGNATURE Avelina Essex, CMA (AAMA)  [facility] [phone]

## 2023-12-27 NOTE — Telephone Encounter (Signed)
 Copied from CRM #[id number]. Topic: Clinical - Home Health Verbal Orders >> Dec 27, 2023  9:38 AM Leonette SQUIBB wrote: Caller/Agency: [facility] with [facility] Number: [phone]  Service Requested: Skilled Nursing for CHF  Frequency: 2 week 1 and 1 week 3  Any new concerns about the patient? No

## 2023-12-30 ENCOUNTER — Telehealth: Payer: Self-pay

## 2023-12-30 NOTE — Telephone Encounter (Signed)
 So noted thank you

## 2023-12-30 NOTE — Telephone Encounter (Signed)
 Copied from CRM [id number]. Topic: Clinical - Home Health Verbal Orders >> Dec 30, 2023 10:21 AM Rosaria BRAVO wrote: Caller/Agency: Marinda from [facility] Number: [phone] Service Requested: Physical Therapy Frequency:   Pt is refusing PT, only did Eval. In and out of hospice, pt is not willing to participate.   Any new concerns about the patient? No

## 2023-12-31 NOTE — Telephone Encounter (Signed)
 Patient has went home- see other message

## 2023-12-31 NOTE — Telephone Encounter (Signed)
 Patient has went home and verbal orders given to home health- has follow up scheduled next week

## 2024-01-03 ENCOUNTER — Telehealth: Payer: Self-pay | Admitting: Family Medicine

## 2024-01-03 NOTE — Telephone Encounter (Signed)
 Copied from CRM #[id number]. Topic: Clinical - Home Health Verbal Orders >> [name] 03, 2024  1:45 PM Zebedee SAUNDERS wrote: Caller/Agency: [facility] per [facility] Number: [phone]  Service Requested: Occupational Therapy Frequency: 1xweek for 2 weeks for mobility, education, prevention, safety, exercise  Any new concerns about the patient? No
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
## 2024-01-03 NOTE — Telephone Encounter (Signed)
 We have verbal order for this (But I thought on a previous message he deferred on these type of referrals)

## 2024-01-04 ENCOUNTER — Ambulatory Visit (HOSPITAL_COMMUNITY): Admission: RE | Admit: 2024-01-04 | Source: Ambulatory Visit

## 2024-01-04 ENCOUNTER — Ambulatory Visit (HOSPITAL_COMMUNITY): Attending: Vascular Surgery | Admitting: Vascular Surgery

## 2024-01-05 ENCOUNTER — Telehealth (INDEPENDENT_AMBULATORY_CARE_PROVIDER_SITE_OTHER): Payer: Self-pay | Admitting: Family Medicine

## 2024-01-05 DIAGNOSIS — M2550 Pain in unspecified joint: Secondary | ICD-10-CM

## 2024-01-05 DIAGNOSIS — R7989 Other specified abnormal findings of blood chemistry: Secondary | ICD-10-CM

## 2024-01-05 DIAGNOSIS — N183 Chronic kidney disease, stage 3 unspecified: Secondary | ICD-10-CM

## 2024-01-05 DIAGNOSIS — I10 Essential (primary) hypertension: Secondary | ICD-10-CM

## 2024-01-05 DIAGNOSIS — I5042 Chronic combined systolic (congestive) and diastolic (congestive) heart failure: Secondary | ICD-10-CM

## 2024-01-05 MED ORDER — CARVEDILOL 12.5 MG PO TABS: 12.5000 mg | ORAL_TABLET | Freq: Two times a day (BID) | ORAL | 7 refills | Status: AC

## 2024-01-05 MED ORDER — CLOPIDOGREL BISULFATE 75 MG PO TABS: 75.0000 mg | ORAL_TABLET | Freq: Every day | ORAL | 2 refills | Status: AC

## 2024-01-05 MED ORDER — SPIRONOLACTONE 25 MG PO TABS: 25.0000 mg | ORAL_TABLET | Freq: Every morning | ORAL | 7 refills | Status: AC

## 2024-01-05 MED ORDER — PANTOPRAZOLE SODIUM 20 MG PO TBEC: 20.0000 mg | DELAYED_RELEASE_TABLET | Freq: Two times a day (BID) | ORAL | 1 refills | Status: AC

## 2024-01-05 MED ORDER — EMPAGLIFLOZIN 10 MG PO TABS: 10.0000 mg | ORAL_TABLET | Freq: Every day | ORAL | 7 refills | Status: AC

## 2024-01-05 MED ORDER — TRAMADOL HCL 50 MG PO TABS: ORAL_TABLET | ORAL | 5 refills | Status: AC

## 2024-01-05 MED ORDER — TORSEMIDE 20 MG PO TABS: 20.0000 mg | ORAL_TABLET | Freq: Every day | ORAL | 7 refills | Status: AC

## 2024-01-05 MED ORDER — AMLODIPINE BESYLATE 10 MG PO TABS: 10.0000 mg | ORAL_TABLET | Freq: Every day | ORAL | Status: AC

## 2024-01-05 NOTE — Telephone Encounter (Signed)
 Spoke with Todd and gave verbal orders for OT if patient , per Dr Glendia.
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
## 2024-01-11 ENCOUNTER — Other Ambulatory Visit: Payer: Self-pay

## 2024-01-11 ENCOUNTER — Telehealth: Payer: Self-pay | Admitting: Family Medicine

## 2024-01-11 DIAGNOSIS — D509 Iron deficiency anemia, unspecified: Secondary | ICD-10-CM

## 2024-01-11 DIAGNOSIS — N183 Chronic kidney disease, stage 3 unspecified: Secondary | ICD-10-CM

## 2024-01-11 DIAGNOSIS — Z79899 Other long term (current) drug therapy: Secondary | ICD-10-CM

## 2024-01-11 NOTE — Telephone Encounter (Signed)
 Nurses Previously I did a telemedicine visit With the family we did discuss checking some lab work including metabolic 7, CBC, liver-hypertension, iron deficient anemia, high risk med He gets his nursing care through [facility] Redfern is his nurse-phone #[phone]  If she is not drawn labs over the past week as listed above please have her do the labs at her convenience-certainly if patient refuses please let us  know

## 2024-01-11 NOTE — Telephone Encounter (Signed)
 Called and left a message with [facility] answering service for lab orders per dr's note.

## 2024-01-12 ENCOUNTER — Inpatient Hospital Stay

## 2024-01-12 ENCOUNTER — Inpatient Hospital Stay: Admitting: Oncology

## 2024-01-28 ENCOUNTER — Ambulatory Visit: Admitting: Family Medicine

## 2024-01-31 ENCOUNTER — Telehealth: Payer: Self-pay | Admitting: Family Medicine

## 2024-01-31 ENCOUNTER — Encounter: Payer: Self-pay | Admitting: Family Medicine

## 2024-01-31 NOTE — Telephone Encounter (Signed)
 Please call his daughter [name] to discuss his situation

## 2024-02-01 NOTE — Telephone Encounter (Signed)
 I talked with patient daughter at length Patient is not eating well and not getting out of bed declining health Would benefit from chronic care management and more than likely if he continues on this path hospice We will connect with [facility] to see if they would be able to see him

## 2024-02-02 ENCOUNTER — Telehealth: Payer: Self-pay | Admitting: Family Medicine

## 2024-02-02 NOTE — Telephone Encounter (Signed)
 Message was sent to Autumn regarding this-[facility] consult-severe arthritis, CHF, bedbound see previous message

## 2024-02-02 NOTE — Telephone Encounter (Signed)
 Hi [name] [name]  This patient is going downhill.  He is not eating much.  He is in a fair amount of pain.  Family is contemplating whether or not to start hospice again on him. He does have underlying severe arthritis essentially bedbound homebound does not want to leave the home.  Family does not want him transported or taken to a nursing home.  They are interested in home care.  I will be doing video visits and intermittent home visits  They are interested in chronic care management with [facility]-Rhonda Lucas or one of the partners-we will still continue to be his family physician and there is a strong probability he will end up on hospice within the next 6 months  Please consult [facility]-when they call they should call his daughter [name] because Sundiata does not answer the phone  He is DNR

## 2024-02-03 NOTE — Telephone Encounter (Signed)
 See other message

## 2024-02-10 NOTE — Telephone Encounter (Signed)
 I will reach out to [name] [name] regarding this patient as a possible ongoing care for her  [phone]

## 2024-02-21 ENCOUNTER — Encounter: Payer: Self-pay | Admitting: Family Medicine

## 2024-02-21 DIAGNOSIS — M6281 Muscle weakness (generalized): Secondary | ICD-10-CM | POA: Diagnosis not present

## 2024-02-21 DIAGNOSIS — R2689 Other abnormalities of gait and mobility: Secondary | ICD-10-CM | POA: Diagnosis not present

## 2024-02-21 DIAGNOSIS — I5042 Chronic combined systolic (congestive) and diastolic (congestive) heart failure: Secondary | ICD-10-CM | POA: Diagnosis not present

## 2024-02-21 NOTE — Telephone Encounter (Signed)
 I did talk with Shona Rigg, NP They are able to provide ongoing care management at home I have sent a message to the daughter-Janice-she will give us  feedback regarding ongoing care and whether or not she wants us  to initiate this referral to [facility]

## 2024-02-22 NOTE — Telephone Encounter (Signed)
 Nurses Please go ahead with referral to [facility] --requesting Shona Rigg, NP to do a evaluation of the patient and take on his ongoing care of this issue.  We would still continue to be a resource for the patient. It would be best to put their daughter [name] as the contact person Once referral is put in their services will reach out to family. Please send MyChart notification to Mr. Pickler/Janice that this referral has been initiated If she has any additional questions or concerns she can reach out to me  Thanks-Dr. Glendia   Diagnosis severe congestive heart failure

## 2024-02-23 ENCOUNTER — Other Ambulatory Visit: Payer: Self-pay

## 2024-02-23 DIAGNOSIS — I5042 Chronic combined systolic (congestive) and diastolic (congestive) heart failure: Secondary | ICD-10-CM

## 2024-04-21 ENCOUNTER — Other Ambulatory Visit: Payer: Self-pay | Admitting: Family Medicine
